# Patient Record
Sex: Female | Born: 1955
Health system: Southern US, Community
[De-identification: ages and names within clinical notes are randomized; demographics above are authoritative.]

## PROBLEM LIST (undated history)

## (undated) DIAGNOSIS — M549 Dorsalgia, unspecified: Secondary | ICD-10-CM

## (undated) DIAGNOSIS — I350 Nonrheumatic aortic (valve) stenosis: Secondary | ICD-10-CM

## (undated) DIAGNOSIS — R0600 Dyspnea, unspecified: Secondary | ICD-10-CM

## (undated) DIAGNOSIS — J45909 Unspecified asthma, uncomplicated: Secondary | ICD-10-CM

## (undated) DIAGNOSIS — Z780 Asymptomatic menopausal state: Secondary | ICD-10-CM

## (undated) DIAGNOSIS — E119 Type 2 diabetes mellitus without complications: Secondary | ICD-10-CM

## (undated) DIAGNOSIS — R1033 Periumbilical pain: Secondary | ICD-10-CM

## (undated) DIAGNOSIS — M7630 Iliotibial band syndrome, unspecified leg: Secondary | ICD-10-CM

## (undated) DIAGNOSIS — J189 Pneumonia, unspecified organism: Secondary | ICD-10-CM

## (undated) DIAGNOSIS — T7840XA Allergy, unspecified, initial encounter: Secondary | ICD-10-CM

## (undated) DIAGNOSIS — M7989 Other specified soft tissue disorders: Secondary | ICD-10-CM

## (undated) DIAGNOSIS — R1310 Dysphagia, unspecified: Secondary | ICD-10-CM

## (undated) DIAGNOSIS — R011 Cardiac murmur, unspecified: Secondary | ICD-10-CM

## (undated) DIAGNOSIS — R002 Palpitations: Secondary | ICD-10-CM

## (undated) DIAGNOSIS — E669 Obesity, unspecified: Secondary | ICD-10-CM

## (undated) DIAGNOSIS — I359 Nonrheumatic aortic valve disorder, unspecified: Secondary | ICD-10-CM

## (undated) DIAGNOSIS — R7303 Prediabetes: Secondary | ICD-10-CM

## (undated) DIAGNOSIS — M1990 Unspecified osteoarthritis, unspecified site: Secondary | ICD-10-CM

## (undated) DIAGNOSIS — K439 Ventral hernia without obstruction or gangrene: Secondary | ICD-10-CM

## (undated) DIAGNOSIS — I10 Essential (primary) hypertension: Secondary | ICD-10-CM

## (undated) DIAGNOSIS — D219 Benign neoplasm of connective and other soft tissue, unspecified: Secondary | ICD-10-CM

## (undated) DIAGNOSIS — M7500 Adhesive capsulitis of unspecified shoulder: Secondary | ICD-10-CM

## (undated) DIAGNOSIS — R12 Heartburn: Secondary | ICD-10-CM

## (undated) DIAGNOSIS — E7800 Pure hypercholesterolemia, unspecified: Secondary | ICD-10-CM

## (undated) DIAGNOSIS — N183 Chronic kidney disease, stage 3 unspecified: Secondary | ICD-10-CM

## (undated) DIAGNOSIS — J449 Chronic obstructive pulmonary disease, unspecified: Secondary | ICD-10-CM

## (undated) DIAGNOSIS — K5900 Constipation, unspecified: Secondary | ICD-10-CM

## (undated) DIAGNOSIS — T502X5A Adverse effect of carbonic-anhydrase inhibitors, benzothiadiazides and other diuretics, initial encounter: Secondary | ICD-10-CM

## (undated) DIAGNOSIS — M2550 Pain in unspecified joint: Secondary | ICD-10-CM

## (undated) DIAGNOSIS — M25511 Pain in right shoulder: Secondary | ICD-10-CM

## (undated) DIAGNOSIS — E876 Hypokalemia: Secondary | ICD-10-CM

## (undated) HISTORY — DX: Type 2 diabetes mellitus without complications: E11.9

## (undated) HISTORY — PX: COLONOSCOPY: SHX174

## (undated) HISTORY — DX: Chronic kidney disease, stage 3 unspecified: N18.30

## (undated) HISTORY — PX: CARDIAC CATHETERIZATION: SHX172

## (undated) HISTORY — DX: Cardiac murmur, unspecified: R01.1

## (undated) HISTORY — DX: Iliotibial band syndrome, unspecified leg: M76.30

## (undated) HISTORY — DX: Other specified soft tissue disorders: M79.89

## (undated) HISTORY — DX: Constipation, unspecified: K59.00

## (undated) HISTORY — DX: Unspecified asthma, uncomplicated: J45.909

## (undated) HISTORY — DX: Palpitations: R00.2

## (undated) HISTORY — DX: Adhesive capsulitis of unspecified shoulder: M75.00

## (undated) HISTORY — DX: Ventral hernia without obstruction or gangrene: K43.9

## (undated) HISTORY — DX: Dorsalgia, unspecified: M54.9

## (undated) HISTORY — DX: Allergy, unspecified, initial encounter: T78.40XA

## (undated) HISTORY — DX: Asymptomatic menopausal state: Z78.0

## (undated) HISTORY — DX: Dysphagia, unspecified: R13.10

## (undated) HISTORY — DX: Pure hypercholesterolemia, unspecified: E78.00

## (undated) HISTORY — DX: Hypokalemia: E87.6

## (undated) HISTORY — DX: Obesity, unspecified: E66.9

## (undated) HISTORY — DX: Benign neoplasm of connective and other soft tissue, unspecified: D21.9

## (undated) HISTORY — DX: Unspecified osteoarthritis, unspecified site: M19.90

## (undated) HISTORY — DX: Pain in unspecified joint: M25.50

## (undated) HISTORY — DX: Nonrheumatic aortic (valve) stenosis: I35.0

## (undated) HISTORY — DX: Pain in right shoulder: M25.511

## (undated) HISTORY — DX: Prediabetes: R73.03

## (undated) HISTORY — DX: Hypokalemia: T50.2X5A

## (undated) HISTORY — DX: Heartburn: R12

## (undated) HISTORY — DX: Dyspnea, unspecified: R06.00

## (undated) HISTORY — DX: Essential (primary) hypertension: I10

## (undated) HISTORY — DX: Periumbilical pain: R10.33

---
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
## 2014-03-17 ENCOUNTER — Ambulatory Visit (INDEPENDENT_AMBULATORY_CARE_PROVIDER_SITE_OTHER): Payer: 59 | Admitting: Internal Medicine

## 2014-03-17 ENCOUNTER — Other Ambulatory Visit (INDEPENDENT_AMBULATORY_CARE_PROVIDER_SITE_OTHER): Payer: 59

## 2014-03-17 ENCOUNTER — Encounter: Payer: Self-pay | Admitting: Internal Medicine

## 2014-03-17 VITALS — BP 232/132 | HR 80 | Temp 97.8°F | Resp 14 | Ht 71.0 in | Wt 342.0 lb

## 2014-03-17 DIAGNOSIS — I160 Hypertensive urgency: Secondary | ICD-10-CM

## 2014-03-17 DIAGNOSIS — I10 Essential (primary) hypertension: Secondary | ICD-10-CM

## 2014-03-17 LAB — BASIC METABOLIC PANEL
BUN: 10 mg/dL (ref 6–23)
CHLORIDE: 108 meq/L (ref 96–112)
CO2: 29 meq/L (ref 19–32)
Calcium: 9.7 mg/dL (ref 8.4–10.5)
Creatinine, Ser: 1.01 mg/dL (ref 0.40–1.20)
GFR: 71.85 mL/min (ref >60.00)
Glucose, Bld: 92 mg/dL (ref 70–99)
Potassium: 4.6 mEq/L (ref 3.5–5.1)
Sodium: 143 mEq/L (ref 135–145)

## 2014-03-17 LAB — CBC
HEMATOCRIT: 38.4 % (ref 36.0–46.0)
Hemoglobin: 12.6 g/dL (ref 12.0–15.0)
MCHC: 32.9 g/dL (ref 30.0–36.0)
MCV: 81.6 fl (ref 78.0–100.0)
PLATELETS: 261 10*3/uL (ref 150.0–400.0)
RBC: 4.71 Mil/uL (ref 3.87–5.11)
RDW: 16 % — ABNORMAL HIGH (ref 11.5–15.5)
WBC: 4.8 10*3/uL (ref 4.0–10.5)

## 2014-03-17 LAB — LIPID PANEL
CHOL/HDL RATIO: 4
Cholesterol: 220 mg/dL — ABNORMAL HIGH (ref 0–200)
HDL: 55.7 mg/dL (ref >39.00)
LDL Cholesterol: 143 mg/dL — ABNORMAL HIGH (ref 0–99)
NONHDL: 164.3
Triglycerides: 109 mg/dL (ref 0.0–149.0)
VLDL: 21.8 mg/dL (ref 0.0–40.0)

## 2014-03-17 LAB — TSH: TSH: 0.74 u[IU]/mL (ref 0.35–4.50)

## 2014-03-17 LAB — HEMOGLOBIN A1C: Hgb A1c MFr Bld: 6.2 % (ref 4.6–6.5)

## 2014-03-17 MED ORDER — LISINOPRIL-HYDROCHLOROTHIAZIDE 20-25 MG PO TABS: 1.0000 | ORAL_TABLET | Freq: Every day | ORAL | Status: DC

## 2014-03-17 NOTE — Patient Instructions (Addendum)
We would like you to go see an eye doctor to make sure there hasn't been any damage from the blood pressure.   We have checked an EKG of the heart today which has changes from many years of high blood pressure but no signs of a heart attack today.   We have called in a blood pressure medicine that we would like you to pick up today and start taking called lisinopril/hctz.Take 1 pill a day.    We would like to see you back on Monday or Tuesday so we can check on the blood pressure. At that time if it's doing better we can send you to the stomach doctor.   High blood pressure that goes untreated for years can cause damage to the heart and kidneys. It can increase your risk of heart attack and stroke.   Working on exercising and eating less salt will help a little bit. We would like you to work on losing 10 pounds in the next 1-2 months to help your health.   Hypertension Hypertension, commonly called high blood pressure, is when the force of blood pumping through your arteries is too strong. Your arteries are the blood vessels that carry blood from your heart throughout your body. A blood pressure reading consists of a higher number over a lower number, such as 110/72. The higher number (systolic) is the pressure inside your arteries when your heart pumps. The lower number (diastolic) is the pressure inside your arteries when your heart relaxes. Ideally you want your blood pressure below 120/80. Hypertension forces your heart to work harder to pump blood. Your arteries may become narrow or stiff. Having hypertension puts you at risk for heart disease, stroke, and other problems.  RISK FACTORS Some risk factors for high blood pressure are controllable. Others are not.  Risk factors you cannot control include:   Race. You may be at higher risk if you are African American.  Age. Risk increases with age.  Gender. Men are at higher risk than women before age 82 years. After age 74, women are at  higher risk than men. Risk factors you can control include:  Not getting enough exercise or physical activity.  Being overweight.  Getting too much fat, sugar, calories, or salt in your diet.  Drinking too much alcohol. SIGNS AND SYMPTOMS Hypertension does not usually cause signs or symptoms. Extremely high blood pressure (hypertensive crisis) may cause headache, anxiety, shortness of breath, and nosebleed. DIAGNOSIS  To check if you have hypertension, your health care provider will measure your blood pressure while you are seated, with your arm held at the level of your heart. It should be measured at least twice using the same arm. Certain conditions can cause a difference in blood pressure between your right and left arms. A blood pressure reading that is higher than normal on one occasion does not mean that you need treatment. If one blood pressure reading is high, ask your health care provider about having it checked again. TREATMENT  Treating high blood pressure includes making lifestyle changes and possibly taking medicine. Living a healthy lifestyle can help lower high blood pressure. You may need to change some of your habits. Lifestyle changes may include:  Following the DASH diet. This diet is high in fruits, vegetables, and whole grains. It is low in salt, red meat, and added sugars.  Getting at least 2 hours of brisk physical activity every week.  Losing weight if necessary.  Not smoking.  Limiting alcoholic  beverages.  Learning ways to reduce stress. If lifestyle changes are not enough to get your blood pressure under control, your health care provider may prescribe medicine. You may need to take more than one. Work closely with your health care provider to understand the risks and benefits. HOME CARE INSTRUCTIONS  Have your blood pressure rechecked as directed by your health care provider.   Take medicines only as directed by your health care provider. Follow the  directions carefully. Blood pressure medicines must be taken as prescribed. The medicine does not work as well when you skip doses. Skipping doses also puts you at risk for problems.   Do not smoke.   Monitor your blood pressure at home as directed by your health care provider. SEEK MEDICAL CARE IF:   You think you are having a reaction to medicines taken.  You have recurrent headaches or feel dizzy.  You have swelling in your ankles.  You have trouble with your vision. SEEK IMMEDIATE MEDICAL CARE IF:  You develop a severe headache or confusion.  You have unusual weakness, numbness, or feel faint.  You have severe chest or abdominal pain.  You vomit repeatedly.  You have trouble breathing. MAKE SURE YOU:   Understand these instructions.  Will watch your condition.  Will get help right away if you are not doing well or get worse. Document Released: 12/31/2004 Document Revised: 05/17/2013 Document Reviewed: 10/23/2012 [facility] Patient Information 2015 [place], [place]. This information is not intended to replace advice given to you by your health care provider. Make sure you discuss any questions you have with your health care provider.

## 2014-03-17 NOTE — Assessment & Plan Note (Signed)
Given other more acute concern unable to discuss fully but advised weight loss and exercise for her health. Check HgA1c.

## 2014-03-17 NOTE — Progress Notes (Signed)
   Subjective:    Patient ID: Rose Miller, female    DOB: May 03, 1955, 59 y.o.   MRN: [id number]  HPI The patient is a new patient coming in for hypertension. She was on blood pressure medicine for many years but stopped seeing her doctor in [place] about 3-4 years ago. She has had no medicine since that time. She denies headache, chest pain, blurred vision, numbness, weakness, nausea, vomiting, confusion. She is not eating well lately and is not exercising. In [place] she saw a heart doctor who told her she had some thickening of the heart muscle (likely from hypertension although patient did not know that). She does not have any other known medical problems. She is several years overdue for colonoscopy repeat.   PMH, [facility], social history reviewed and updated at today's visit.   Review of Systems  Constitutional: Positive for activity change. Negative for fever, chills, appetite change, fatigue and unexpected weight change.  HENT: Negative.   Eyes: Negative for photophobia, redness and visual disturbance.  Respiratory: Negative for cough, chest tightness, shortness of breath and wheezing.   Cardiovascular: Negative for chest pain, palpitations and leg swelling.  Gastrointestinal: Positive for constipation. Negative for nausea, abdominal pain, diarrhea and abdominal distention.       Chronic  Endocrine: Negative.   Musculoskeletal: Negative.   Skin: Negative.   Neurological: Negative.   Psychiatric/Behavioral: Negative.       Objective:   Physical Exam  Constitutional: She is oriented to person, place, and time. She appears well-developed and well-nourished.  Overweight  HENT:  Head: Normocephalic and atraumatic.  Eyes: EOM are normal. Pupils are equal, round, and reactive to light.  Neck: Normal range of motion.  Cardiovascular: Normal rate and regular rhythm.   Murmur heard. Systolic murmur radiating to carotids  Pulmonary/Chest: Effort normal and breath sounds normal. No  respiratory distress. She has no wheezes.  Abdominal: Soft. Bowel sounds are normal. She exhibits no distension. There is no tenderness.  Musculoskeletal: She exhibits no edema.  Neurological: She is alert and oriented to person, place, and time. Coordination normal.  Skin: Skin is warm and dry.  Psychiatric: She has a normal mood and affect.   Filed Vitals:   03/17/14 1359  BP: 232/132  Pulse: 80  Temp: 97.8 F (36.6 C)  TempSrc: Oral  Resp: 14  Height: 5\' 11"  (1.803 m)  Weight: 342 lb (155.13 kg)  SpO2: 93%   EKG: rate 51, intervals okay, normal axis, LVH noted, no st or t wave changes. No old to compare to.      Assessment & Plan:  Visit time 45 minutes, greater than 50% of which was spent in face to face counseling about severe acute condition.

## 2014-03-17 NOTE — Progress Notes (Signed)
Pre visit review using our clinic review tool, if applicable. No additional management support is needed unless otherwise documented below in the visit note. 

## 2014-03-17 NOTE — Assessment & Plan Note (Signed)
>>  ASSESSMENT AND PLAN FOR MORBID OBESITY (HCC) WRITTEN ON 03/17/2014  7:17 PM BY Genella Mech A, MD  Given other more acute concern unable to discuss fully but advised weight loss and exercise for her health. Check HgA1c.

## 2014-03-17 NOTE — Assessment & Plan Note (Signed)
Asymptomatic, check BMP, EKG. Suspect that she has chronic uncontrolled HTN (based on her thickened heart muscles years ago). Stressed today the importance of taking blood pressure medicine and not stopping. She will start lisinopril/hctz as long as labs okay. Return in 1 week for recheck. Advised if she develops symptoms of her blood pressure to go to ER. Also suspect she will have some degree of CKD given that her pressures are so high for so long uncontrolled.

## 2014-03-21 ENCOUNTER — Ambulatory Visit (INDEPENDENT_AMBULATORY_CARE_PROVIDER_SITE_OTHER): Payer: 59 | Admitting: Internal Medicine

## 2014-03-21 ENCOUNTER — Encounter: Payer: Self-pay | Admitting: Internal Medicine

## 2014-03-21 VITALS — BP 180/92 | HR 84 | Temp 97.8°F | Resp 16 | Wt 339.0 lb

## 2014-03-21 DIAGNOSIS — Z1239 Encounter for other screening for malignant neoplasm of breast: Secondary | ICD-10-CM

## 2014-03-21 DIAGNOSIS — I10 Essential (primary) hypertension: Secondary | ICD-10-CM

## 2014-03-21 DIAGNOSIS — Z1211 Encounter for screening for malignant neoplasm of colon: Secondary | ICD-10-CM

## 2014-03-21 MED ORDER — AMLODIPINE BESYLATE 10 MG PO TABS: 10.0000 mg | ORAL_TABLET | Freq: Every day | ORAL | Status: DC

## 2014-03-21 NOTE — Progress Notes (Signed)
Pre visit review using our clinic review tool, if applicable. No additional management support is needed unless otherwise documented below in the visit note. 

## 2014-03-21 NOTE — Patient Instructions (Signed)
We will send in a new blood pressure medicine called amlodipine which you take 1 pill daily. I would like you to start taking this Saturday this week. This will give your body enough time to adjust to the blood pressure.   We would like you to start walking or doing some kind of exercise about 3 times per week. Start with 5 minutes at a time and then increase by 5-10 minutes per week until you are able to do 30 minutes at a time.   Come back in about 1 month to check on the blood pressure. If you have any problems or questions before then please call our office.  Low-Sodium Eating Plan Sodium raises blood pressure and causes water to be held in the body. Getting less sodium from food will help lower your blood pressure, reduce any swelling, and protect your heart, liver, and kidneys. We get sodium by adding salt (sodium chloride) to food. Most of our sodium comes from canned, boxed, and frozen foods. Restaurant foods, fast foods, and pizza are also very high in sodium. Even if you take medicine to lower your blood pressure or to reduce fluid in your body, getting less sodium from your food is important. WHAT IS MY PLAN? Most people should limit their sodium intake to 2,300 mg a day. Your health care provider recommends that you limit your sodium intake to ________3000 mg__ a day.  WHAT DO I NEED TO KNOW ABOUT THIS EATING PLAN? For the low-sodium eating plan, you will follow these general guidelines:  Choose foods with a % Daily Value for sodium of less than 5% (as listed on the food label).   Use salt-free seasonings or herbs instead of table salt or sea salt.   Check with your health care provider or pharmacist before using salt substitutes.   Eat fresh foods.  Eat more vegetables and fruits.  Limit canned vegetables. If you do use them, rinse them well to decrease the sodium.   Limit cheese to 1 oz (28 g) per day.   Eat lower-sodium products, often labeled as "lower sodium" or "no  salt added."  Avoid foods that contain monosodium glutamate (MSG). MSG is sometimes added to [place] food and some canned foods.  Check food labels (Nutrition Facts labels) on foods to learn how much sodium is in one serving.  Eat more home-cooked food and less restaurant, buffet, and fast food.  When eating at a restaurant, ask that your food be prepared with less salt or none, if possible.  HOW DO I READ FOOD LABELS FOR SODIUM INFORMATION? The Nutrition Facts label lists the amount of sodium in one serving of the food. If you eat more than one serving, you must multiply the listed amount of sodium by the number of servings. Food labels may also identify foods as:  Sodium free--Less than 5 mg in a serving.  Very low sodium--35 mg or less in a serving.  Low sodium--140 mg or less in a serving.  Light in sodium--50% less sodium in a serving. For example, if a food that usually has 300 mg of sodium is changed to become light in sodium, it will have 150 mg of sodium.  Reduced sodium--25% less sodium in a serving. For example, if a food that usually has 400 mg of sodium is changed to reduced sodium, it will have 300 mg of sodium. WHAT FOODS CAN I EAT? Grains Low-sodium cereals, including oats, puffed wheat and rice, and shredded wheat cereals. Low-sodium crackers.  Unsalted rice and pasta. Lower-sodium bread.  Vegetables Frozen or fresh vegetables. Low-sodium or reduced-sodium canned vegetables. Low-sodium or reduced-sodium tomato sauce and paste. Low-sodium or reduced-sodium tomato and vegetable juices.  Fruits Fresh, frozen, and canned fruit. Fruit juice.  Meat and Other Protein Products Low-sodium canned tuna and salmon. Fresh or frozen meat, poultry, seafood, and fish. Lamb. Unsalted nuts. Dried beans, peas, and lentils without added salt. Unsalted canned beans. Homemade soups without salt. Eggs.  Dairy Milk. Soy milk. Ricotta cheese. Low-sodium or reduced-sodium cheeses.  Yogurt.  Condiments Fresh and dried herbs and spices. Salt-free seasonings. Onion and garlic powders. Low-sodium varieties of mustard and ketchup. Lemon juice.  Fats and Oils Reduced-sodium salad dressings. Unsalted butter.  Other Unsalted popcorn and pretzels.  The items listed above may not be a complete list of recommended foods or beverages. Contact your dietitian for more options. WHAT FOODS ARE NOT RECOMMENDED? Grains Instant hot cereals. Bread stuffing, pancake, and biscuit mixes. Croutons. Seasoned rice or pasta mixes. Noodle soup cups. Boxed or frozen macaroni and cheese. Self-rising flour. Regular salted crackers. Vegetables Regular canned vegetables. Regular canned tomato sauce and paste. Regular tomato and vegetable juices. Frozen vegetables in sauces. Salted french fries. Olives. Angie Fava. Relishes. Sauerkraut. Salsa. Meat and Other Protein Products Salted, canned, smoked, spiced, or pickled meats, seafood, or fish. Bacon, ham, sausage, hot dogs, corned beef, chipped beef, and packaged luncheon meats. Salt pork. Jerky. Pickled herring. Anchovies, regular canned tuna, and sardines. Salted nuts. Dairy Processed cheese and cheese spreads. Cheese curds. Blue cheese and cottage cheese. Buttermilk.  Condiments Onion and garlic salt, seasoned salt, table salt, and sea salt. Canned and packaged gravies. Worcestershire sauce. Tartar sauce. Barbecue sauce. Teriyaki sauce. Soy sauce, including reduced sodium. Steak sauce. Fish sauce. Oyster sauce. Cocktail sauce. Horseradish. Regular ketchup and mustard. Meat flavorings and tenderizers. Bouillon cubes. Hot sauce. Tabasco sauce. Marinades. Taco seasonings. Relishes. Fats and Oils Regular salad dressings. Salted butter. Margarine. Ghee. Bacon fat.  Other Potato and tortilla chips. Corn chips and puffs. Salted popcorn and pretzels. Canned or dried soups. Pizza. Frozen entrees and pot pies.  The items listed above may not be a  complete list of foods and beverages to avoid. Contact your dietitian for more information. Document Released: 06/22/2001 Document Revised: 01/05/2013 Document Reviewed: 11/04/2012 [facility] Patient Information 2015 [place], [place]. This information is not intended to replace advice given to you by your health care provider. Make sure you discuss any questions you have with your health care provider.

## 2014-03-22 NOTE — Assessment & Plan Note (Signed)
She is doing okay on lisinopril/hctz 20/25. BP much better but still high. Will have her start amlodipine in addition starting this weekend. She will return in about 4-5 weeks for recheck. She did not show any signs of CKD on recent blood work which is very good. Have spoken to her about the seriousness of taking her medicine. She is also to start exercising slowly to help with her pressures and her weight.

## 2014-03-22 NOTE — Progress Notes (Signed)
   Subjective:    Patient ID: Rose Miller, female    DOB: Aug 17, 1955, 59 y.o.   MRN: [id number]  HPI The patient is a 59 YO female who is coming in today to follow up on her blood pressure. She had severe high pressure last week and was started on two blood pressure medications. She has been taking them for about 5 days at this time. She originally felt a little bad on the medicines but has stuck with them. She denies headaches, chest pains, tightness. She is doing okay and feels better now. She has not been able to start exercising.   Review of Systems  Constitutional: Positive for activity change. Negative for fever, chills, appetite change, fatigue and unexpected weight change.       Not exercising  HENT: Negative.   Eyes: Negative for photophobia, redness and visual disturbance.  Respiratory: Negative for cough, chest tightness, shortness of breath and wheezing.   Cardiovascular: Negative for chest pain, palpitations and leg swelling.  Gastrointestinal: Positive for constipation. Negative for nausea, abdominal pain, diarrhea and abdominal distention.       Chronic  Endocrine: Negative.   Musculoskeletal: Negative.   Skin: Negative.   Neurological: Negative.   Psychiatric/Behavioral: Negative.       Objective:   Physical Exam  Constitutional: She is oriented to person, place, and time. She appears well-developed and well-nourished.  Overweight  HENT:  Head: Normocephalic and atraumatic.  Eyes: EOM are normal. Pupils are equal, round, and reactive to light.  Neck: Normal range of motion.  Cardiovascular: Normal rate and regular rhythm.   Murmur heard. Systolic murmur radiating to carotids  Pulmonary/Chest: Effort normal and breath sounds normal. No respiratory distress. She has no wheezes.  Abdominal: Soft. Bowel sounds are normal. She exhibits no distension. There is no tenderness.  Musculoskeletal: She exhibits no edema.  Neurological: She is alert and oriented to person,  place, and time. Coordination normal.  Skin: Skin is warm and dry.  Psychiatric: She has a normal mood and affect.   Filed Vitals:   03/21/14 1538  BP: 180/92  Pulse: 84  Temp: 97.8 F (36.6 C)  TempSrc: Oral  Resp: 16  Weight: 339 lb (153.769 kg)  SpO2: 97%      Assessment & Plan:

## 2014-04-07 ENCOUNTER — Telehealth: Payer: Self-pay | Admitting: Internal Medicine

## 2014-04-12 ENCOUNTER — Ambulatory Visit
Admission: RE | Admit: 2014-04-12 | Discharge: 2014-04-12 | Disposition: A | Discharge status: Home / Self Care | Payer: 59 | Source: Ambulatory Visit | Attending: Internal Medicine | Admitting: Internal Medicine

## 2014-04-12 DIAGNOSIS — Z1239 Encounter for other screening for malignant neoplasm of breast: Secondary | ICD-10-CM

## 2014-04-21 ENCOUNTER — Ambulatory Visit (INDEPENDENT_AMBULATORY_CARE_PROVIDER_SITE_OTHER): Payer: 59 | Admitting: Internal Medicine

## 2014-04-21 ENCOUNTER — Telehealth: Payer: Self-pay | Admitting: Internal Medicine

## 2014-04-21 ENCOUNTER — Encounter: Payer: Self-pay | Admitting: Internal Medicine

## 2014-04-21 ENCOUNTER — Other Ambulatory Visit (INDEPENDENT_AMBULATORY_CARE_PROVIDER_SITE_OTHER): Payer: 59

## 2014-04-21 VITALS — BP 126/78 | HR 84 | Temp 98.5°F | Resp 18 | Wt 333.0 lb

## 2014-04-21 DIAGNOSIS — I10 Essential (primary) hypertension: Secondary | ICD-10-CM | POA: Diagnosis not present

## 2014-04-21 LAB — COMPREHENSIVE METABOLIC PANEL
ALT: 10 U/L (ref 0–35)
AST: 11 U/L (ref 0–37)
Albumin: 4.1 g/dL (ref 3.5–5.2)
Alkaline Phosphatase: 77 U/L (ref 39–117)
BILIRUBIN TOTAL: 0.4 mg/dL (ref 0.2–1.2)
BUN: 29 mg/dL — AB (ref 6–23)
CALCIUM: 9.8 mg/dL (ref 8.4–10.5)
CHLORIDE: 99 meq/L (ref 96–112)
CO2: 31 mEq/L (ref 19–32)
CREATININE: 1.69 mg/dL — AB (ref 0.40–1.20)
GFR: 39.66 mL/min — AB (ref >60.00)
Glucose, Bld: 121 mg/dL — ABNORMAL HIGH (ref 70–99)
POTASSIUM: 4.1 meq/L (ref 3.5–5.1)
SODIUM: 137 meq/L (ref 135–145)
Total Protein: 7.8 g/dL (ref 6.0–8.3)

## 2014-04-21 MED ORDER — AMLODIPINE BESYLATE 10 MG PO TABS: 10.0000 mg | ORAL_TABLET | Freq: Every day | ORAL | Status: DC

## 2014-04-21 NOTE — Telephone Encounter (Signed)
Patient states flonase was suppose to be sent to [facility] on [address].  Pharmacy does not have script.

## 2014-04-21 NOTE — Patient Instructions (Addendum)
We will check some blood work today to make sure that the kidneys are still doing well and no problems from the medicine.   For the sinuses we can have you try a nose spray which helps to stop the nose from making as much mucus and can help clear the head. It is called flonase and use 2 sprays in each nostril once a day. It should start working in about 3-5 days.   You are doing great so keep up the good work with taking the medicines. Your blood pressure is wonderful today!  Now we need to start working on bringing the weight down some so that we can try to keep you healthy for a long time.   Exercise to Lose Weight Exercise and a healthy diet may help you lose weight. Your doctor may suggest specific exercises. EXERCISE IDEAS AND TIPS  Choose low-cost things you enjoy doing, such as walking, bicycling, or exercising to workout videos.  Take stairs instead of the elevator.  Walk during your lunch break.  Park your car further away from work or school.  Go to a gym or an exercise class.  Start with 5 to 10 minutes of exercise each day. Build up to 30 minutes of exercise 4 to 6 days a week.  Wear shoes with good support and comfortable clothes.  Stretch before and after working out.  Work out until you breathe harder and your heart beats faster.  Drink extra water when you exercise.  Do not do so much that you hurt yourself, feel dizzy, or get very short of breath. Exercises that burn about 150 calories:  Running 1  miles in 15 minutes.  Playing volleyball for 45 to 60 minutes.  Washing and waxing a car for 45 to 60 minutes.  Playing touch football for 45 minutes.  Walking 1  miles in 35 minutes.  Pushing a stroller 1  miles in 30 minutes.  Playing basketball for 30 minutes.  Raking leaves for 30 minutes.  Bicycling 5 miles in 30 minutes.  Walking 2 miles in 30 minutes.  Dancing for 30 minutes.  Shoveling snow for 15 minutes.  Swimming laps for 20  minutes.  Walking up stairs for 15 minutes.  Bicycling 4 miles in 15 minutes.  Gardening for 30 to 45 minutes.  Jumping rope for 15 minutes.  Washing windows or floors for 45 to 60 minutes. Document Released: 02/02/2010 Document Revised: 03/25/2011 Document Reviewed: 02/02/2010 [facility] Patient Information 2015 [place], [place]. This information is not intended to replace advice given to you by your health care provider. Make sure you discuss any questions you have with your health care provider.

## 2014-04-21 NOTE — Progress Notes (Signed)
Pre visit review using our clinic review tool, if applicable. No additional management support is needed unless otherwise documented below in the visit note. 

## 2014-04-22 ENCOUNTER — Other Ambulatory Visit: Payer: Self-pay | Admitting: Internal Medicine

## 2014-04-22 ENCOUNTER — Other Ambulatory Visit: Payer: Self-pay | Admitting: Geriatric Medicine

## 2014-04-22 DIAGNOSIS — Z1211 Encounter for screening for malignant neoplasm of colon: Secondary | ICD-10-CM

## 2014-04-22 MED ORDER — FLUTICASONE PROPIONATE 50 MCG/ACT NA SUSP: 2.0000 | Freq: Every day | NASAL | Status: DC

## 2014-04-22 NOTE — Telephone Encounter (Signed)
Sent in

## 2014-04-22 NOTE — Assessment & Plan Note (Signed)
BP at goal today, continue amlodipine, lisinopril/hctz and check labs today as she has been on regimen about 3-4 weeks now.

## 2014-04-22 NOTE — Assessment & Plan Note (Signed)
Down 6 pounds from dietary changes. She will start exercising within the next month and talked with her about continuing with her weight loss to help prevent risk of stroke, heart attack, diabetes in the future. She does not want to talk to nutritionist.

## 2014-04-22 NOTE — Progress Notes (Signed)
   Subjective:    Patient ID: Rose Miller, female    DOB: [birth year], 59 y.o.   MRN: [id number]  HPI The patient is a 59 YO female who is coming in to check on her blood pressure and her weights. She has been taking both her blood pressure medicines now for about 1 month and is doing well. At first felt kind of bad but now is not having side effects. Denies headaches, chills, fevers, chest pains. Her weight is coming down and will be starting to exercise in the next month. She has tried to work on her diet. She is down about 6 pounds and is happy with that start.   Review of Systems  Constitutional: Positive for activity change. Negative for fever, chills, appetite change, fatigue and unexpected weight change.       Not exercising  Eyes: Negative for photophobia, redness and visual disturbance.  Respiratory: Negative for cough, chest tightness, shortness of breath and wheezing.   Cardiovascular: Negative for chest pain, palpitations and leg swelling.  Gastrointestinal: Negative for nausea, abdominal pain, diarrhea and abdominal distention.  Musculoskeletal: Negative.   Neurological: Negative.       Objective:   Physical Exam  Constitutional: She is oriented to person, place, and time. She appears well-developed and well-nourished.  Overweight  HENT:  Head: Normocephalic and atraumatic.  Cardiovascular: Normal rate and regular rhythm.   Murmur heard. Systolic murmur radiating to carotids  Pulmonary/Chest: Effort normal and breath sounds normal. No respiratory distress. She has no wheezes.  Abdominal: Soft. Bowel sounds are normal. She exhibits no distension. There is no tenderness.  Musculoskeletal: She exhibits no edema.  Neurological: She is alert and oriented to person, place, and time. Coordination normal.   Filed Vitals:   04/21/14 1431  BP: 126/78  Pulse: 84  Temp: 98.5 F (36.9 C)  TempSrc: Oral  Resp: 18  Weight: 333 lb (151.048 kg)  SpO2: 94%       Assessment &  Plan:

## 2014-04-22 NOTE — Telephone Encounter (Signed)
Patient will go pick up.

## 2014-04-22 NOTE — Assessment & Plan Note (Signed)
>>  ASSESSMENT AND PLAN FOR MORBID OBESITY (HCC) WRITTEN ON 04/22/2014  8:51 AM BY Dorise Hiss, ELIZABETH A, MD  Down 6 pounds from dietary changes. She will start exercising within the next month and talked with her about continuing with her weight loss to help prevent risk of stroke, heart attack, diabetes in the future. She does not want to talk to nutritionist.

## 2014-04-22 NOTE — Telephone Encounter (Signed)
Were you going to give her flonase?

## 2014-04-25 ENCOUNTER — Telehealth: Payer: Self-pay | Admitting: Geriatric Medicine

## 2014-04-25 ENCOUNTER — Other Ambulatory Visit (INDEPENDENT_AMBULATORY_CARE_PROVIDER_SITE_OTHER): Payer: 59

## 2014-04-25 ENCOUNTER — Other Ambulatory Visit: Payer: Self-pay | Admitting: Internal Medicine

## 2014-04-25 DIAGNOSIS — I10 Essential (primary) hypertension: Secondary | ICD-10-CM

## 2014-04-25 LAB — BASIC METABOLIC PANEL
BUN: 28 mg/dL — ABNORMAL HIGH (ref 6–23)
CHLORIDE: 97 meq/L (ref 96–112)
CO2: 30 meq/L (ref 19–32)
Calcium: 9.5 mg/dL (ref 8.4–10.5)
Creatinine, Ser: 1.78 mg/dL — ABNORMAL HIGH (ref 0.40–1.20)
GFR: 37.35 mL/min — ABNORMAL LOW (ref >60.00)
Glucose, Bld: 139 mg/dL — ABNORMAL HIGH (ref 70–99)
Potassium: 3.9 mEq/L (ref 3.5–5.1)
SODIUM: 134 meq/L — AB (ref 135–145)

## 2014-04-25 NOTE — Telephone Encounter (Signed)
Patient states that she is having terrible stomach pains and wants to know if you can order any tests to try to find out what the problem might be.

## 2014-04-26 ENCOUNTER — Encounter: Payer: Self-pay | Admitting: Internal Medicine

## 2014-04-26 NOTE — Telephone Encounter (Signed)
She needs to stop taking the lisinopril/hctz. Keep taking amlodipine and come back on Friday for blood work. She may need acute visit for the stomach problems as if she is getting dehydrated this can be what is affecting the kidney function.

## 2014-04-26 NOTE — Telephone Encounter (Signed)
Spoke with patient. She will stop lisinopril hctz and will come in Friday for an appt and to repeat labs.

## 2014-04-29 ENCOUNTER — Encounter: Payer: Self-pay | Admitting: Internal Medicine

## 2014-04-29 ENCOUNTER — Other Ambulatory Visit (INDEPENDENT_AMBULATORY_CARE_PROVIDER_SITE_OTHER): Payer: 59

## 2014-04-29 ENCOUNTER — Ambulatory Visit (INDEPENDENT_AMBULATORY_CARE_PROVIDER_SITE_OTHER): Payer: 59 | Admitting: Internal Medicine

## 2014-04-29 VITALS — BP 156/96 | HR 86 | Temp 98.5°F | Resp 18 | Wt 330.0 lb

## 2014-04-29 DIAGNOSIS — I10 Essential (primary) hypertension: Secondary | ICD-10-CM

## 2014-04-29 DIAGNOSIS — N179 Acute kidney failure, unspecified: Secondary | ICD-10-CM | POA: Diagnosis not present

## 2014-04-29 DIAGNOSIS — O10019 Pre-existing essential hypertension complicating pregnancy, unspecified trimester: Secondary | ICD-10-CM

## 2014-04-29 DIAGNOSIS — K439 Ventral hernia without obstruction or gangrene: Secondary | ICD-10-CM | POA: Diagnosis not present

## 2014-04-29 LAB — COMPREHENSIVE METABOLIC PANEL
ALBUMIN: 4.2 g/dL (ref 3.5–5.2)
ALK PHOS: 75 U/L (ref 39–117)
ALT: 13 U/L (ref 0–35)
AST: 14 U/L (ref 0–37)
BILIRUBIN TOTAL: 0.4 mg/dL (ref 0.2–1.2)
BUN: 18 mg/dL (ref 6–23)
CHLORIDE: 99 meq/L (ref 96–112)
CO2: 31 meq/L (ref 19–32)
CREATININE: 1.29 mg/dL — AB (ref 0.40–1.20)
Calcium: 10.1 mg/dL (ref 8.4–10.5)
GFR: 54.15 mL/min — ABNORMAL LOW (ref >60.00)
GLUCOSE: 105 mg/dL — AB (ref 70–99)
Potassium: 4.2 mEq/L (ref 3.5–5.1)
Sodium: 136 mEq/L (ref 135–145)
Total Protein: 8.3 g/dL (ref 6.0–8.3)

## 2014-04-29 MED ORDER — ATENOLOL 50 MG PO TABS: 50.0000 mg | ORAL_TABLET | Freq: Every day | ORAL | Status: DC

## 2014-04-29 NOTE — Progress Notes (Signed)
Pre visit review using our clinic review tool, if applicable. No additional management support is needed unless otherwise documented below in the visit note. 

## 2014-04-29 NOTE — Patient Instructions (Signed)
The bulge in the stomach is most likely an umbilical hernia. They are not harmful unless they pop up and do not go back in. They can from grow excessive coughing, straining. Extra stomach fat can also cause it to grow.   We have sent in atenolol. Please stay off the lisinopril/hctz. We are rechecking your blood work today and will call you back.   Ventral Hernia A ventral hernia (also called an incisional hernia) is a hernia that occurs at the site of a previous surgical cut (incision) in the abdomen. The abdominal wall spans from your lower chest down to your pelvis. If the abdominal wall is weakened from a surgical incision, a hernia can occur. A hernia is a bulge of bowel or muscle tissue pushing out on the weakened part of the abdominal wall. Ventral hernias can get bigger from straining or lifting. Obese and older people are at higher risk for a ventral hernia. People who develop infections after surgery or require repeat incisions at the same site on the abdomen are also at increased risk. CAUSES  A ventral hernia occurs because of weakness in the abdominal wall at an incision site.  SYMPTOMS  Common symptoms include:  A visible bulge or lump on the abdominal wall.  Pain or tenderness around the lump.  Increased discomfort if you cough or make a sudden movement. If the hernia has blocked part of the intestine, a serious complication can occur (incarcerated or strangulated hernia). This can become a problem that requires emergency surgery because the blood flow to the blocked intestine may be cut off. Symptoms may include:  Feeling sick to your stomach (nauseous).  Throwing up (vomiting).  Stomach swelling (distention) or bloating.  Fever.  Rapid heartbeat. DIAGNOSIS  Your health care provider will take a medical history and perform a physical exam. Various tests may be ordered, such as:  Blood tests.  Urine tests.  Ultrasonography.  X-rays.  Computed tomography  (CT). TREATMENT  Watchful waiting may be all that is needed for a smaller hernia that does not cause symptoms. Your health care provider may recommend the use of a supportive belt (truss) that helps to keep the abdominal wall intact. For larger hernias or those that cause pain, surgery to repair the hernia is usually recommended. If a hernia becomes strangulated, emergency surgery needs to be done right away. HOME CARE INSTRUCTIONS  Avoid putting pressure or strain on the abdominal area.  Avoid heavy lifting.  Use good body positioning for physical tasks. Ask your health care provider about proper body positioning.  Use a supportive belt as directed by your health care provider.  Maintain a healthy weight.  Eat foods that are high in fiber, such as whole grains, fruits, and vegetables. Fiber helps prevent difficult bowel movements (constipation).  Drink enough fluids to keep your urine clear or pale yellow.  Follow up with your health care provider as directed. SEEK MEDICAL CARE IF:   Your hernia seems to be getting larger or more painful. SEEK IMMEDIATE MEDICAL CARE IF:   You have abdominal pain that is sudden and sharp.  Your pain becomes severe.  You have repeated vomiting.  You are sweating a lot.  You notice a rapid heartbeat.  You develop a fever. MAKE SURE YOU:   Understand these instructions.  Will watch your condition.  Will get help right away if you are not doing well or get worse. Document Released: 12/18/2011 Document Revised: 05/17/2013 Document Reviewed: 12/18/2011 [employer] Patient Information 2015  [employer]. This information is not intended to replace advice given to you by your health care provider. Make sure you discuss any questions you have with your health care provider.

## 2014-05-01 DIAGNOSIS — N179 Acute kidney failure, unspecified: Secondary | ICD-10-CM | POA: Insufficient documentation

## 2014-05-01 DIAGNOSIS — K439 Ventral hernia without obstruction or gangrene: Secondary | ICD-10-CM | POA: Insufficient documentation

## 2014-05-01 NOTE — Assessment & Plan Note (Addendum)
BP has increased since stopping lisinopril/hctz and is in moderate exacerbation. Will add atenolol and see her back in 1 month. Rechecking blood work today to check on kidneys. Will recheck labs in 1 month as well to see how she is doing. May need adjustment to her regimen at follow up. If any symptoms or problems with the atenolol will call before visit. Continue amlodipine and add atenolol. Stay off lisinopril/hctz for now.

## 2014-05-01 NOTE — Progress Notes (Signed)
   Subjective:    Patient ID: Rose Miller, female    DOB: [birth year], 59 y.o.   MRN: [id number]  HPI The patient is a 59 YO female who is coming in for recheck of her hypertension (newly back on medications and had to stop lisinopril/hctz due to renal insufficiency earlier this week). She has been taking the amlodipine alone and is feeling better. She realizes that the lisinopril/hctz had been making her feel a little nauseous. She denies headaches, chest pains, SOB, confusion. The other reason she is here is for abdominal pain. She has a spot that bulges out right above her belly button. Especially if she coughs or strains. About the size of a nickel and always able to go back in. Some pain when it pushes out but does not last long. Has been present for many months and she is worried about it getting bigger or if it would cause problems.   Review of Systems  Constitutional: Negative for fever, chills, appetite change, fatigue and unexpected weight change.       Not exercising  Eyes: Negative for photophobia, redness and visual disturbance.  Respiratory: Negative for cough, chest tightness, shortness of breath and wheezing.   Cardiovascular: Negative for chest pain, palpitations and leg swelling.  Gastrointestinal: Positive for abdominal distention. Negative for nausea, abdominal pain, diarrhea, constipation and blood in stool.  Musculoskeletal: Negative.   Neurological: Negative.       Objective:   Physical Exam  Constitutional: She is oriented to person, place, and time. She appears well-developed and well-nourished.  Overweight  HENT:  Head: Normocephalic and atraumatic.  Cardiovascular: Normal rate and regular rhythm.   Murmur heard. Systolic murmur radiating to carotids  Pulmonary/Chest: Effort normal and breath sounds normal. No respiratory distress. She has no wheezes.  Abdominal: Soft. Bowel sounds are normal. She exhibits no distension. There is no tenderness.  Not able to  appreciate what sounds to be an umbilical hernia, coughing does not provoke it on exam today.  Musculoskeletal: She exhibits no edema.  Neurological: She is alert and oriented to person, place, and time. Coordination normal.   Filed Vitals:   04/29/14 1448 04/29/14 1528  BP: 162/102 156/96  Pulse: 86   Temp: 98.5 F (36.9 C)   TempSrc: Oral   Resp: 18   Weight: 330 lb (149.687 kg)   SpO2: 95%       Assessment & Plan:

## 2014-05-01 NOTE — Assessment & Plan Note (Signed)
Sounds to be a ventral hernia around her umbilicus. Not able to appreciate on exam today. Reassured her that she does not need a CT scan. Moving her bowels normally. Talked to her about avoiding straining and excessive coughing. Also talked to her about extra stomach fat can cause pressure which causes them to increase in size. Not recommended to fix at this time. Conservative management of staying regular and losing some stomach weight.

## 2014-05-01 NOTE — Assessment & Plan Note (Signed)
On last bloodwork found to have AKI, likely medication induced from lisinopril/hctz. Will recheck labs today (off for 3-4 days). If no improvement will check US renal. Add atenolol for BP control. Can consider adding back hctz in 1-2 months after full recovery.

## 2014-05-24 ENCOUNTER — Ambulatory Visit (AMBULATORY_SURGERY_CENTER): Payer: Self-pay

## 2014-05-24 VITALS — Ht 71.0 in | Wt 336.8 lb

## 2014-05-24 DIAGNOSIS — Z800 Family history of malignant neoplasm of digestive organs: Secondary | ICD-10-CM

## 2014-05-24 NOTE — Progress Notes (Signed)
No allergies to eggs or soy No past problems with anesthesia No diet/weight loss meds No home oxygen

## 2014-06-06 ENCOUNTER — Encounter: Payer: Self-pay | Admitting: Internal Medicine

## 2014-06-06 ENCOUNTER — Other Ambulatory Visit (INDEPENDENT_AMBULATORY_CARE_PROVIDER_SITE_OTHER): Payer: 59

## 2014-06-06 ENCOUNTER — Ambulatory Visit (INDEPENDENT_AMBULATORY_CARE_PROVIDER_SITE_OTHER): Payer: 59 | Admitting: Internal Medicine

## 2014-06-06 VITALS — BP 118/88 | HR 42 | Temp 98.5°F | Resp 12 | Wt 340.0 lb

## 2014-06-06 DIAGNOSIS — I10 Essential (primary) hypertension: Secondary | ICD-10-CM

## 2014-06-06 DIAGNOSIS — N179 Acute kidney failure, unspecified: Secondary | ICD-10-CM

## 2014-06-06 LAB — BASIC METABOLIC PANEL
BUN: 14 mg/dL (ref 6–23)
CO2: 29 mEq/L (ref 19–32)
Calcium: 9.2 mg/dL (ref 8.4–10.5)
Chloride: 106 mEq/L (ref 96–112)
Creatinine, Ser: 1.05 mg/dL (ref 0.40–1.20)
GFR: 68.65 mL/min (ref >60.00)
Glucose, Bld: 93 mg/dL (ref 70–99)
POTASSIUM: 4.3 meq/L (ref 3.5–5.1)
SODIUM: 138 meq/L (ref 135–145)

## 2014-06-06 MED ORDER — ATENOLOL 25 MG PO TABS: 25.0000 mg | ORAL_TABLET | Freq: Every day | ORAL | Status: DC

## 2014-06-06 NOTE — Assessment & Plan Note (Signed)
Recovering and will recheck BMP today to ensure back to normal. Will watch and could re-introduce those medicines with monitoring in the future. BP well controlled today.

## 2014-06-06 NOTE — Assessment & Plan Note (Signed)
Will halve atenolol dose to 25 mg daily since her HR is 42 today on 50 mg daily and she has had several episodes of lightheadedness. Checking BMP for full recovery of the kidneys. BP at goal. Will recheck in 3 months and reminded her about her weight loss goals (she is up today in weight).

## 2014-06-06 NOTE — Progress Notes (Signed)
   Subjective:    Patient ID: Rose Miller, female    DOB: 1955/05/19, 59 y.o.   MRN: [id number]  HPI The patient is a 59 YO female coming in to follow up on her abnormal labs and her high blood pressure. She was well controlled but got AKI from lisinopril/hctz. She has been switched to atenolol and continued on amlodipine. She is feeling better overall. 2-3 episodes of lightheadedness while on the atenolol. No syncope. Passes in 30 seconds. No chest pains, headaches, SOB.   Review of Systems  Constitutional: Positive for activity change. Negative for fever, chills, appetite change, fatigue and unexpected weight change.       Not exercising  Eyes: Negative for photophobia, redness and visual disturbance.  Respiratory: Negative for cough, chest tightness, shortness of breath and wheezing.   Cardiovascular: Negative for chest pain, palpitations and leg swelling.  Gastrointestinal: Negative for nausea, abdominal pain, diarrhea and abdominal distention.  Musculoskeletal: Negative.   Skin: Negative.   Neurological: Negative.       Objective:   Physical Exam  Constitutional: She is oriented to person, place, and time. She appears well-developed and well-nourished.  Overweight  HENT:  Head: Normocephalic and atraumatic.  Cardiovascular: Normal rate and regular rhythm.   Murmur heard. Systolic murmur radiating to carotids  Pulmonary/Chest: Effort normal and breath sounds normal. No respiratory distress. She has no wheezes.  Abdominal: Soft. Bowel sounds are normal. She exhibits no distension. There is no tenderness.  Musculoskeletal: She exhibits no edema.  Neurological: She is alert and oriented to person, place, and time. Coordination normal.   Filed Vitals:   06/06/14 1042  BP: 118/88  Pulse: 42  Temp: 98.5 F (36.9 C)  TempSrc: Oral  Resp: 12  Weight: 340 lb (154.223 kg)  SpO2: 97%      Assessment & Plan:

## 2014-06-06 NOTE — Progress Notes (Signed)
Pre visit review using our clinic review tool, if applicable. No additional management support is needed unless otherwise documented below in the visit note. 

## 2014-06-06 NOTE — Patient Instructions (Signed)
We are going to have you cut the amlodipine in half until they are gone. This will help with the feeling or lightheadedness and dizziness. We will send in the new strength and then you will be back to taking 1 pill a day (25 mg).   Keep taking the amlodipine as you have been.   We are going to check the blood work today and call you back with the results.   Keep up the work on the weight as this will help naturally bring the blood pressure down some and keep you healthy.   Come back in about 3 months. If you are having problems before then please feel free to call the office.  Exercise to Lose Weight Exercise and a healthy diet may help you lose weight. Your doctor may suggest specific exercises. EXERCISE IDEAS AND TIPS  Choose low-cost things you enjoy doing, such as walking, bicycling, or exercising to workout videos.  Take stairs instead of the elevator.  Walk during your lunch break.  Park your car further away from work or school.  Go to a gym or an exercise class.  Start with 5 to 10 minutes of exercise each day. Build up to 30 minutes of exercise 4 to 6 days a week.  Wear shoes with good support and comfortable clothes.  Stretch before and after working out.  Work out until you breathe harder and your heart beats faster.  Drink extra water when you exercise.  Do not do so much that you hurt yourself, feel dizzy, or get very short of breath. Exercises that burn about 150 calories:  Running 1  miles in 15 minutes.  Playing volleyball for 45 to 60 minutes.  Washing and waxing a car for 45 to 60 minutes.  Playing touch football for 45 minutes.  Walking 1  miles in 35 minutes.  Pushing a stroller 1  miles in 30 minutes.  Playing basketball for 30 minutes.  Raking leaves for 30 minutes.  Bicycling 5 miles in 30 minutes.  Walking 2 miles in 30 minutes.  Dancing for 30 minutes.  Shoveling snow for 15 minutes.  Swimming laps for 20 minutes.  Walking up  stairs for 15 minutes.  Bicycling 4 miles in 15 minutes.  Gardening for 30 to 45 minutes.  Jumping rope for 15 minutes.  Washing windows or floors for 45 to 60 minutes. Document Released: 02/02/2010 Document Revised: 03/25/2011 Document Reviewed: 02/02/2010 [facility] Patient Information 2015 [place], [place]. This information is not intended to replace advice given to you by your health care provider. Make sure you discuss any questions you have with your health care provider.

## 2014-06-07 ENCOUNTER — Ambulatory Visit (AMBULATORY_SURGERY_CENTER): Payer: 59 | Admitting: Internal Medicine

## 2014-06-07 ENCOUNTER — Encounter: Payer: Self-pay | Admitting: Internal Medicine

## 2014-06-07 VITALS — BP 131/62 | HR 45 | Temp 96.8°F | Resp 20 | Ht 71.0 in | Wt 336.0 lb

## 2014-06-07 DIAGNOSIS — Z1211 Encounter for screening for malignant neoplasm of colon: Secondary | ICD-10-CM | POA: Diagnosis not present

## 2014-06-07 DIAGNOSIS — Z800 Family history of malignant neoplasm of digestive organs: Secondary | ICD-10-CM

## 2014-06-07 MED ORDER — SODIUM CHLORIDE 0.9 % IV SOLN: 500.0000 mL | INTRAVENOUS | Status: DC

## 2014-06-07 NOTE — Progress Notes (Signed)
Transferred to recovery room. A/O x3, pleased with MAC.  VSS.  Report to Wendy, RN. 

## 2014-06-07 NOTE — Op Note (Signed)
[place]  [address] [place], [address]   COLONOSCOPY PROCEDURE REPORT  PATIENT: Rose Miller, Rose Miller  MR#: [id number] BIRTHDATE: 1955-03-09 , 58  yrs. old GENDER: female ENDOSCOPIST: Gatha Mayer, MD, [facility] PROCEDURE DATE:  06/07/2014 PROCEDURE:   Colonoscopy, screening First Screening Colonoscopy - Avg.  risk and is 50 yrs.  old or older - No.  Prior Negative Screening - Now for repeat screening. Less than 10 yrs Prior Negative Screening - Now for repeat screening.  Above average risk  History of Adenoma - Now for follow-up colonoscopy & has been > or = to 3 yrs.  N/A  Polyps removed today? No Recommend repeat exam, <10 yrs? Yes high risk ASA CLASS:   Class II INDICATIONS:Screening for colonic neoplasia and FH Colon or Rectal Adenocarcinoma. MEDICATIONS: Propofol 250 mg IV and Monitored anesthesia care  DESCRIPTION OF PROCEDURE:   After the risks benefits and alternatives of the procedure were thoroughly explained, informed consent was obtained.  The digital rectal exam revealed no abnormalities of the rectum.   The     endoscope was introduced through the anus and advanced to the cecum, which was identified by both the appendix and ileocecal valve. No adverse events experienced.   The quality of the prep was (MiraLax was used) good. The instrument was then slowly withdrawn as the colon was fully examined. Estimated blood loss is zero unless otherwise noted in this procedure report.      COLON FINDINGS: There was severe diverticulosis noted in the left colon.   The examination was otherwise normal.  Retroflexed views revealed no abnormalities. The time to cecum = 1.9 Withdrawal time = 12.9   The scope was withdrawn and the procedure completed. COMPLICATIONS: There were no immediate complications.  ENDOSCOPIC IMPRESSION: 1.   Severe diverticulosis was noted in the left colon 2.   The examination was otherwise normal  RECOMMENDATIONS: Repeat  colonoscopy in 5 years (2021) planned but if father had colon cancer after his early 63's would not do again until 2026 (10 yrs). She will try to find out.  eSigned:  Gatha Mayer, MD, [facility] 06/07/2014 2:28 PM   cc: Rose Novak, MD and The Patient

## 2014-06-07 NOTE — Patient Instructions (Addendum)
No polyps! You do have diverticulosis - thickened muscle rings and pouches in the colon wall. Please read the handout about this condition.  Your next routine colonoscopy should be in 5 years - 2021 If your father was younger than his early 89's when colon cancer was found. If he was older I would wait 10 years - 2026.  I appreciate the opportunity to care for you. Gatha Mayer, MD, FACG  YOU HAD AN ENDOSCOPIC PROCEDURE TODAY AT [facility]:   Refer to the procedure report that was given to you for any specific questions about what was found during the examination.  If the procedure report does not answer your questions, please call your gastroenterologist to clarify.  If you requested that your care partner not be given the details of your procedure findings, then the procedure report has been included in a sealed envelope for you to review at your convenience later.  YOU SHOULD EXPECT: Some feelings of bloating in the abdomen. Passage of more gas than usual.  Walking can help get rid of the air that was put into your GI tract during the procedure and reduce the bloating. If you had a lower endoscopy (such as a colonoscopy or flexible sigmoidoscopy) you may notice spotting of blood in your stool or on the toilet paper. If you underwent a bowel prep for your procedure, you may not have a normal bowel movement for a few days.  Please Note:  You might notice some irritation and congestion in your nose or some drainage.  This is from the oxygen used during your procedure.  There is no need for concern and it should clear up in a day or so.  SYMPTOMS TO REPORT IMMEDIATELY:   Following lower endoscopy (colonoscopy or flexible sigmoidoscopy):  Excessive amounts of blood in the stool  Significant tenderness or worsening of abdominal pains  Swelling of the abdomen that is new, acute  Fever of 100F or higher  For urgent or emergent issues, a gastroenterologist can be  reached at any hour by calling [phone].   DIET: Your first meal following the procedure should be a small meal and then it is ok to progress to your normal diet. Heavy or fried foods are harder to digest and may make you feel nauseous or bloated.  Likewise, meals heavy in dairy and vegetables can increase bloating.  Drink plenty of fluids but you should avoid alcoholic beverages for 24 hours.  ACTIVITY:  You should plan to take it easy for the rest of today and you should NOT DRIVE or use heavy machinery until tomorrow (because of the sedation medicines used during the test).    FOLLOW UP: Our staff will call the number listed on your records the next business day following your procedure to check on you and address any questions or concerns that you may have regarding the information given to you following your procedure. If we do not reach you, we will leave a message.  However, if you are feeling well and you are not experiencing any problems, there is no need to return our call.  We will assume that you have returned to your regular daily activities without incident.  If any biopsies were taken you will be contacted by phone or by letter within the next 1-3 weeks.  Please call us at [phone] if you have not heard about the biopsies in 3 weeks.    SIGNATURES/CONFIDENTIALITY: You and/or your care partner have  signed paperwork which will be entered into your electronic medical record.  These signatures attest to the fact that that the information above on your After Visit Summary has been reviewed and is understood.  Full responsibility of the confidentiality of this discharge information lies with you and/or your care-partner.  Please review diverticulosis handout provided.

## 2014-06-08 ENCOUNTER — Telehealth: Payer: Self-pay | Admitting: *Deleted

## 2014-06-08 NOTE — Telephone Encounter (Signed)
  Follow up Call-  Call back number 06/07/2014  Post procedure Call Back phone  # [phone] or cell number [phone] leave message  Permission to leave phone message Yes     Patient questions:  Do you have a fever, pain , or abdominal swelling? No. Pain Score  0 *  Have you tolerated food without any problems? Yes.    Have you been able to return to your normal activities? Yes.    Do you have any questions about your discharge instructions: Diet   No. Medications  No. Follow up visit  No.  Do you have questions or concerns about your Care? No.  Actions: * If pain score is 4 or above: No action needed, pain <4.

## 2014-06-17 NOTE — Addendum Note (Signed)
Addended by: Steva Ready on: 06/17/2014 11:35 AM   Modules accepted: Level of Service

## 2014-09-09 ENCOUNTER — Encounter: Payer: Self-pay | Admitting: Internal Medicine

## 2014-09-09 ENCOUNTER — Ambulatory Visit (INDEPENDENT_AMBULATORY_CARE_PROVIDER_SITE_OTHER): Payer: 59 | Admitting: Internal Medicine

## 2014-09-09 VITALS — BP 132/70 | HR 58 | Temp 98.7°F | Resp 16 | Ht 71.0 in | Wt 343.0 lb

## 2014-09-09 DIAGNOSIS — M25511 Pain in right shoulder: Secondary | ICD-10-CM

## 2014-09-09 DIAGNOSIS — M7632 Iliotibial band syndrome, left leg: Secondary | ICD-10-CM | POA: Diagnosis not present

## 2014-09-09 DIAGNOSIS — I10 Essential (primary) hypertension: Secondary | ICD-10-CM

## 2014-09-09 NOTE — Patient Instructions (Signed)
We will get you in with sports medicine and have put the stretching exercises below so you can work on those before seeing him.   You are doing great with the blood pressure but need to get back in with the exercise to keep the weight under control and keep yourself healthy.   Come back in 3-6 months for a check up.   Iliotibial Band Syndrome with Rehab The iliotibial (IT) band is a tendon that connects the hip muscles to the shinbone (tibia) and to one of the bones of the pelvis (ileum). The IT band passes by the knee and is often irritated by the outer portion of the knee (lateral femoral condyle). A fluid filled sac (bursa) exists between the tendon and the bone, to cushion and reduce friction. Overuse of the tendon may cause excessive friction, which results in IT band syndrome. This condition involves inflammation of the bursa (bursitis) and/or inflammation of the IT band (tendinitis). SYMPTOMS   Pain, tenderness, swelling, warmth, or redness over the IT band, at the outer knee (above the joint).  Pain that travels up or down the thigh or leg.  Initially, pain at the beginning of an exercise, that decreases once warmed up. Eventually, pain throughout the activity, getting worse as the activity continues. May cause the athlete to stop in the middle of training or competing.  Pain that gets worse when running down hills or stairs, on banked tracks, or next to the curb on the street.  Pain that increases when the foot of the affected leg hits the ground.  Possibly, a crackling sound (crepitation) when the tendon or bursa is moved or touched. CAUSES  IT band syndrome is caused by irritation of the IT band and the underlying bursa. This eventually results in inflammation and pain. IT band syndrome is an overuse injury.  RISK INCREASES WITH:  Sports with repetitive knee-bending activities (distance running, cycling).  Incorrect training techniques, including sudden changes in the  intensity, frequency, or duration of training.  Not enough rest between workouts.  Poor strength and flexibility, especially a tight IT band.  Failure to warm up properly before activity.  Bow legs.  Arthritis of the knee. PREVENTION   Warm up and stretch properly before activity.  Allow for adequate recovery between workouts.  Maintain physical fitness:  Strength, flexibility, and endurance.  Cardiovascular fitness.  Learn and use proper training technique, including reducing running mileage, shortening stride, and avoiding running on hills and banked surfaces.  Wear arch supports (orthotics), if you have flat feet. PROGNOSIS  If treated properly, IT band syndrome usually goes away within 6 weeks of treatment. RELATED COMPLICATIONS   Longer healing time, if not properly treated, or if not given enough time to heal.  Recurring inflammation of the tendon and bursa, that may result in a chronic condition.  Recurring symptoms, if activity is resumed too soon, with overuse, with a direct blow, or with poor training technique.  Inability to complete training or competition. TREATMENT  Treatment first involves the use of ice and medicine, to reduce pain and inflammation. The use of strengthening and stretching exercises may help reduce pain with activity. These exercises may be performed at home or with a therapist. For individuals with flat feet, an arch support (orthotic) may be helpful. Some individuals find that wearing a knee sleeve or compression bandage around the knee during workouts provides some relief. Certain training techniques, such as adjusting stride length, avoiding running on hills or stairs, changing the  direction you run on a circular or banked track, or changing the side of the road you run on, if you run next to the curb, may help decrease symptoms of IT band syndrome. Cyclists may need to change the seat height or foot position on their bicycles. An injection of  cortisone into the bursa may be recommended. Surgery to remove the inflamed bursa and/or part of the IT band is only considered after at least 6 months of non-surgical treatment.  MEDICATION   If pain medicine is needed, nonsteroidal anti-inflammatory medicines (aspirin and ibuprofen), or other minor pain relievers (acetaminophen), are often advised.  Do not take pain medicine for 7 days before surgery.  Prescription pain relievers may be given, if your caregiver thinks they are needed. Use only as directed and only as much as you need.  Corticosteroid injections may be given by your caregiver. These injections should be reserved for the most serious cases, because they may only be given a certain number of times. HEAT AND COLD  Cold treatment (icing) should be applied for 10 to 15 minutes every 2 to 3 hours for inflammation and pain, and immediately after activity that aggravates your symptoms. Use ice packs or an ice massage.  Heat treatment may be used before performing stretching and strengthening activities prescribed by your caregiver, physical therapist, or athletic trainer. Use a heat pack or a warm water soak. SEEK MEDICAL CARE IF:   Symptoms get worse or do not improve in 2 to 4 weeks, despite treatment.  New, unexplained symptoms develop. (Drugs used in treatment may produce side effects.) EXERCISES  RANGE OF MOTION (ROM) AND STRETCHING EXERCISES - Iliotibial Band Syndrome These exercises may help you when beginning to rehabilitate your injury. Your symptoms may go away with or without further involvement from your physician, physical therapist or athletic trainer. While completing these exercises, remember:   Restoring tissue flexibility helps normal motion to return to the joints. This allows healthier, less painful movement and activity.  An effective stretch should be held for at least 30 seconds.  A stretch should never be painful. You should only feel a gentle lengthening  or release in the stretched tissue. STRETCH - Quadriceps, Prone   Lie on your stomach on a firm surface, such as a bed or padded floor.  Bend your right / left knee and grasp your ankle. If you are unable to reach your ankle or pant leg, use a belt around your foot to lengthen your reach.  Gently pull your heel toward your buttocks. Your knee should not slide out to the side. You should feel a stretch in the front of your thigh and knee.  Hold this position for __________ seconds. Repeat __________ times. Complete this stretch __________ times per day.  STRETCH - Iliotibial Band  On the floor or bed, lie on your side, so your right / left leg is on top. Bend your knee and grab your ankle.  Slowly bring your knee back so that your thigh is in line with your trunk. Keep your heel at your buttocks and gently arch your back, so your head, shoulders and hips line up.  Slowly lower your leg so that your knee approaches the floor or bed, until you feel a gentle stretch on the outside of your right / left thigh. If you do not feel a stretch and your knee will not fall farther, place the heel of your opposite foot on top of your knee, and pull your thigh  down farther.  Hold this stretch for __________ seconds. Repeat __________ times. Complete this stretch __________ times per day. STRENGTHENING EXERCISES - Iliotibial Band Syndrome Improving the flexibility of the IT band will best relieve your discomfort due to IT band syndrome. Strengthening exercises, however, can help improve both muscle endurance and joint mechanics, reducing the factors that can contribute to this condition. Your physician, physical therapist or athletic trainer may provide you with exercises that train specific muscle groups that are especially weak. The following exercises target muscles that are often weak in people who have IT band syndrome. STRENGTH - Hip Abductors, Straight Leg Raises  Be aware of your form throughout the  entire exercise, so that you exercise the correct muscles. Poor form means that you are not strengthening the correct muscles.  Lie on your side, so that your head, shoulders, knee and hip line up. You may bend your lower knee to help maintain your balance. Your right / left leg should be on top.  Roll your hips slightly forward, so that your hips are stacked directly over each other and your right / left knee is facing forward.  Lift your top leg up 4-6 inches, leading with your heel. Be sure that your foot does not drift forward and that your knee does not roll toward the ceiling.  Hold this position for __________ seconds. You should feel the muscles in your outer hip lifting (you may not notice this until your leg begins to tire).  Slowly lower your leg to the starting position. Allow the muscles to fully relax before beginning the next repetition. Repeat __________ times. Complete this exercise __________ times per day.  STRENGTH - Quad/VMO, Isometric  Sit in a chair with your right / left knee slightly bent. With your fingertips, feel the VMO muscle (just above the inside of your knee). The VMO is important in controlling the position of your kneecap.  Keeping your fingertips on this muscle. Without actually moving your leg, attempt to drive your knee down, as if straightening your leg. You should feel your VMO tense. If you have a difficult time, you may wish to try the same exercise on your healthy knee first.  Tense this muscle as hard as you can, without increasing any knee pain.  Hold for __________ seconds. Relax the muscles slowly and completely between each repetition. Repeat __________ times. Complete this exercise __________ times per day.  Document Released: 12/31/2004 Document Revised: 03/25/2011 Document Reviewed: 04/14/2008 [facility] Patient Information 2015 [place], [place]. This information is not intended to replace advice given to you by your health care provider. Make  sure you discuss any questions you have with your health care provider.

## 2014-09-09 NOTE — Progress Notes (Signed)
Pre visit review using our clinic review tool, if applicable. No additional management support is needed unless otherwise documented below in the visit note. 

## 2014-09-11 DIAGNOSIS — M25511 Pain in right shoulder: Secondary | ICD-10-CM | POA: Insufficient documentation

## 2014-09-11 DIAGNOSIS — M7630 Iliotibial band syndrome, unspecified leg: Secondary | ICD-10-CM | POA: Insufficient documentation

## 2014-09-11 NOTE — Progress Notes (Signed)
   Subjective:    Patient ID: Rose Miller, female    DOB: 1955/11/17, 59 y.o.   MRN: [id number]  HPI The patient is a 59 YO female coming in for follow up of her weight and blood pressure and with new problem of shoulder and leg pain. Previously she was doing well and losing weight. She has now put on about 10 pounds since last time. She is not exercising much lately and her food has been only okay and not great. She knows she needs to get motivated.  Her BP is good and no side effects with the medicine. No fatigue or SOB, no headaches or chest pains.  The new problem of shoulder pain has been going on several years but has worsened lately. This is the first time she has brought it up. Daily 4/10 pain. Trying tylenol for it with some relief. Not changing her ROM much although slightly limiting it. The leg pain is in the last 5 days, denies injury or pulling something. 6/10 pain. Worse with walking or activity. Sore to the touch to sleep on. Radiates to her butt region but not into her feet.   Review of Systems  Constitutional: Positive for activity change. Negative for fever, chills, appetite change, fatigue and unexpected weight change.       Not exercising  Eyes: Negative for photophobia, redness and visual disturbance.  Respiratory: Negative for cough, chest tightness, shortness of breath and wheezing.   Cardiovascular: Negative for chest pain, palpitations and leg swelling.  Gastrointestinal: Negative for nausea, abdominal pain, diarrhea and abdominal distention.  Musculoskeletal: Positive for myalgias and arthralgias.  Skin: Negative.   Neurological: Negative.       Objective:   Physical Exam  Constitutional: She is oriented to person, place, and time. She appears well-developed and well-nourished.  Overweight  HENT:  Head: Normocephalic and atraumatic.  Cardiovascular: Normal rate and regular rhythm.   Murmur heard. Systolic murmur radiating to carotids  Pulmonary/Chest: Effort  normal and breath sounds normal. No respiratory distress. She has no wheezes.  Abdominal: Soft. Bowel sounds are normal. She exhibits no distension. There is no tenderness.  Musculoskeletal: She exhibits tenderness. She exhibits no edema.  Tenderness along the left IT band, right shoulder at the [facility] joint. ROM intact but not as full as the left shoulder.   Neurological: She is alert and oriented to person, place, and time. Coordination normal.   Filed Vitals:   09/09/14 1333  BP: 132/70  Pulse: 58  Temp: 98.7 F (37.1 C)  TempSrc: Oral  Resp: 16  Height: 5\' 11"  (1.803 m)  Weight: 343 lb (155.584 kg)  SpO2: 92%      Assessment & Plan:

## 2014-09-11 NOTE — Assessment & Plan Note (Signed)
Referral to sports medicine for possible injection. Is in pain daily, no acute injury, ligaments intact and in proper position.

## 2014-09-11 NOTE — Assessment & Plan Note (Signed)
>>  ASSESSMENT AND PLAN FOR MORBID OBESITY (HCC) WRITTEN ON 09/11/2014  8:16 AM BY Dorise Hiss, ELIZABETH A, MD  In exacerbation, weight up about 10 pounds since last visit. She is not exercising and not watching her food intake at all. She knows she needs to get motivated for change so she can do more and at least start exercising even if the weight is not changed significantly.

## 2014-09-11 NOTE — Assessment & Plan Note (Signed)
BP at goal, continue atenolol and amlodipine.

## 2014-09-11 NOTE — Assessment & Plan Note (Signed)
Given location and description this is the most likely. Given stretching and rehab exercises for it. Referral to sports medicine for evaluation if not improved.

## 2014-09-11 NOTE — Assessment & Plan Note (Signed)
In exacerbation, weight up about 10 pounds since last visit. She is not exercising and not watching her food intake at all. She knows she needs to get motivated for change so she can do more and at least start exercising even if the weight is not changed significantly.

## 2014-09-29 ENCOUNTER — Other Ambulatory Visit (INDEPENDENT_AMBULATORY_CARE_PROVIDER_SITE_OTHER): Payer: 59

## 2014-09-29 ENCOUNTER — Ambulatory Visit (INDEPENDENT_AMBULATORY_CARE_PROVIDER_SITE_OTHER): Payer: 59 | Admitting: Family Medicine

## 2014-09-29 ENCOUNTER — Encounter: Payer: Self-pay | Admitting: Family Medicine

## 2014-09-29 VITALS — BP 132/78 | HR 62 | Wt 341.0 lb

## 2014-09-29 DIAGNOSIS — M7501 Adhesive capsulitis of right shoulder: Secondary | ICD-10-CM | POA: Diagnosis not present

## 2014-09-29 DIAGNOSIS — M25511 Pain in right shoulder: Secondary | ICD-10-CM | POA: Diagnosis not present

## 2014-09-29 NOTE — Patient Instructions (Signed)
Good to see you.  Ice 20 minutes 2 times daily. Usually after activity and before bed. Exercises 3 times a week.  Vitamin D 2000 IU daily pennsaid pinkie amount topically 2 times daily as needed.  See me again in 3-4 weeks.

## 2014-09-29 NOTE — Progress Notes (Signed)
[facility] [address], [name] Phone: [phone] Subjective:    I'm seeing this patient by the request  of:  Olga Millers, MD   CC:  Right shoulder pain  LEX:[id number] Rose Miller is a 59 y.o. female coming in with complaint of right shoulder pain.  Patient has had this pain for multiple months if not years. Patient does not remember any specific injury. Hurts when trying to reach above her head or even biter back. Has noticed that she may have decreased range of motion. Sometimes radiation down the arm towards her hand but never to the fingers. Patient is a severity pain a 7 out of 10. Denies any weakness. Patient is left-handed. Patient states sometimes at night it can be uncomfortable to follow sleep at that wakes her up. Starting affects some of her daily activities.   Past Medical History  Diagnosis Date  . Asthma   . Arthritis   . Allergy   . Heart murmur   . Hypertension    Past Surgical History  Procedure Laterality Date  . Cesarean section  1989  . Tonsillectomy and adenoidectomy  1960   Social History  Substance Use Topics  . Smoking status: Former Research scientist (life sciences)  . Smokeless tobacco: None  . Alcohol Use: No   No Known Allergies Family History  Problem Relation Age of Onset  . Arthritis Mother   . Cancer Mother     pancreatic  . Hypertension Mother   . Cancer Father     prostate and colon  . Colon cancer Father        Past medical history, social, surgical and family history all reviewed in electronic medical record.   Review of Systems: No headache, visual changes, nausea, vomiting, diarrhea, constipation, dizziness, abdominal pain, skin rash, fevers, chills, night sweats, weight loss, swollen lymph nodes, body aches, joint swelling, muscle aches, chest pain, shortness of breath, mood changes.   Objective Blood pressure 132/78, pulse 62, weight 341 lb (154.677 kg).  General: No apparent distress  alert and oriented x3 mood and affect normal, dressed appropriately.  HEENT: Pupils equal, extraocular movements intact  Respiratory: Patient's speak in full sentences and does not appear short of breath  Cardiovascular: No lower extremity edema, non tender, no erythema  Skin: Warm dry intact with no signs of infection or rash on extremities or on axial skeleton.  Abdomen: Soft nontender  Neuro: Cranial nerves II through XII are intact, neurovascularly intact in all extremities with 2+ DTRs and 2+ pulses.  Lymph: No lymphadenopathy of posterior or anterior cervical chain or axillae bilaterally.  Gait normal with good balance and coordination.  MSK:  Non tender with full range of motion and good stability and symmetric strength and tone of elbows, wrist, hip, knee and ankles bilaterally.   Shoulder: left Inspection reveals no abnormalities, atrophy or asymmetry. Palpation is normal with no tenderness over AC joint or bicipital groove. ROM is full in all planes passively. Last 10 of forward flexion only has 5 of external rotation and internal rotation to the sacrum signs of impingement with positive Neer and Hawkin's tests, but negative empty can sign. Speeds and Yergason's tests normal. No labral pathology noted with negative Obrien's, negative clunk and good stability. Normal scapular function observed. No painful arc and no drop arm sign. No apprehension sign  MSK US performed of: left This study was ordered, performed, and interpreted by Charlann Boxer D.O.  Shoulder:   Supraspinatus:  Appears normal on long and transverse views, Bursal bulge seen with shoulder abduction on impingement view. Infraspinatus:  Appears normal on long and transverse views. Significant increase in Doppler flow Subscapularis:  Appears normal on long and transverse views. Positive bursa Teres Minor:  Appears normal on long and transverse views. AC joint:  Capsule undistended, no geyser sign. Glenohumeral  Joint:  Appears normal without effusion. Glenoid Labrum:  Intact without visualized tears. Biceps Tendon:  Appears normal on long and transverse views, no fraying of tendon, tendon located in intertubercular groove, no subluxation with shoulder internal or external rotation.  Impression: Subacromial bursitis with early frozen shoulder  Procedure: Real-time Ultrasound Guided Injection of left glenohumeral joint Device: GE Logiq E  Ultrasound guided injection is preferred based studies that show increased duration, increased effect, greater accuracy, decreased procedural pain, increased response rate with ultrasound guided versus blind injection.  Verbal informed consent obtained.  Time-out conducted.  Noted no overlying erythema, induration, or other signs of local infection.  Skin prepped in a sterile fashion.  Local anesthesia: Topical Ethyl chloride.  With sterile technique and under real time ultrasound guidance:  Joint visualized.  23g 1  inch needle inserted posterior approach. Pictures taken for needle placement. Patient did have injection of 2 cc of 1% lidocaine, 2 cc of 0.5% Marcaine, and 1.0 cc of Kenalog 40 mg/dL. Completed without difficulty  Pain immediately resolved suggesting accurate placement of the medication.  Advised to call if fevers/chills, erythema, induration, drainage, or persistent bleeding.  Images permanently stored and available for review in the ultrasound unit.  Impression: Technically successful ultrasound guided injection.  Procedure note 50932; 15 minutes spent for Therapeutic exercises as stated in above notes.  This included exercises focusing on stretching, strengthening, with significant focus on eccentric aspects. Shoulder Exercises that included:  Basic scapular stabilization to include adduction and depression of scapula Scaption, focusing on proper movement and good control Internal and External rotation utilizing a theraband, with elbow tucked at  side entire time Rows with theraband  Proper technique shown and discussed handout in great detail with ATC.  All questions were discussed and answered.      Impression and Recommendations:     This case required medical decision making of moderate complexity.

## 2014-09-29 NOTE — Assessment & Plan Note (Signed)
Patient was given an injection today. Patient would do home exercises and work with Product/process development scientist. Patient will try topical anti-inflammatory.we discussed possible formal physical therapy which patient declined. Patient will try icing. Patient come back and see me again in 3-4 weeks for further evaluation and treatment.

## 2014-10-25 ENCOUNTER — Ambulatory Visit (INDEPENDENT_AMBULATORY_CARE_PROVIDER_SITE_OTHER): Payer: 59 | Admitting: Family Medicine

## 2014-10-25 ENCOUNTER — Encounter: Payer: Self-pay | Admitting: Family Medicine

## 2014-10-25 VITALS — BP 112/80 | HR 49 | Ht 71.0 in | Wt 339.0 lb

## 2014-10-25 DIAGNOSIS — M7501 Adhesive capsulitis of right shoulder: Secondary | ICD-10-CM

## 2014-10-25 NOTE — Assessment & Plan Note (Signed)
Patient is doing significantly better at this time. We discussed icing regimen and home exercises still for the next 6 weeks. If patient has any worsening symptoms she'll follow-up with me again.

## 2014-10-25 NOTE — Progress Notes (Signed)
  [facility] [address], [place] [address] Phone: [phone] Subjective:    I'm seeing this patient by the request  of:  Hoyt Koch, MD   CC:  Right shoulder pain follow up  PXT:[id number] Rose Miller is a 59 y.o. female coming in with complaint of right shoulder pain. Patient was seen one month ago and was given a steroid injection in her shoulder. This is for [facility] to be an early frozen shoulder. Patient was given home exercises, icing protocol, topical anti-inflammatories. Patient states overall doing significantly better. States that she is 100% better. Not giving her any trouble during the day. Not waking her up at night. Doing the exercises occasionally.   Past Medical History  Diagnosis Date  . Asthma   . Arthritis   . Allergy   . Heart murmur   . Hypertension    Past Surgical History  Procedure Laterality Date  . Cesarean section  1989  . Tonsillectomy and adenoidectomy  1960   Social History  Substance Use Topics  . Smoking status: Former Research scientist (life sciences)  . Smokeless tobacco: None  . Alcohol Use: No   No Known Allergies Family History  Problem Relation Age of Onset  . Arthritis Mother   . Cancer Mother     pancreatic  . Hypertension Mother   . Cancer Father     prostate and colon  . Colon cancer Father        Past medical history, social, surgical and family history all reviewed in electronic medical record.   Review of Systems: No headache, visual changes, nausea, vomiting, diarrhea, constipation, dizziness, abdominal pain, skin rash, fevers, chills, night sweats, weight loss, swollen lymph nodes, body aches, joint swelling, muscle aches, chest pain, shortness of breath, mood changes.   Objective Blood pressure 112/80, pulse 49, height 5\' 11"  (1.803 m), weight 339 lb (153.769 kg), SpO2 92 %.  General: No apparent distress alert and oriented x3 mood and affect normal, dressed appropriately.  HEENT: Pupils  equal, extraocular movements intact  Respiratory: Patient's speak in full sentences and does not appear short of breath  Cardiovascular: No lower extremity edema, non tender, no erythema  Skin: Warm dry intact with no signs of infection or rash on extremities or on axial skeleton.  Abdomen: Soft nontender  Neuro: Cranial nerves II through XII are intact, neurovascularly intact in all extremities with 2+ DTRs and 2+ pulses.  Lymph: No lymphadenopathy of posterior or anterior cervical chain or axillae bilaterally.  Gait normal with good balance and coordination.  MSK:  Non tender with full range of motion and good stability and symmetric strength and tone of elbows, wrist, hip, knee and ankles bilaterally.   Shoulder: Right Inspection reveals no abnormalities, atrophy or asymmetry. Palpation is normal with no tenderness over AC joint or bicipital groove. ROM is full in all planes passively. Full range of motion which is significant improvement from previous exam Minimal impingement sign still noted Speeds and Yergason's tests normal. No labral pathology noted with negative Obrien's, negative clunk and good stability. Normal scapular function observed. No painful arc and no drop arm sign. No apprehension sign Contralateral shoulder unremarkable.      Impression and Recommendations:     This case required medical decision making of moderate complexity.

## 2014-10-25 NOTE — Progress Notes (Signed)
Pre visit review using our clinic review tool, if applicable. No additional management support is needed unless otherwise documented below in the visit note. 

## 2014-10-25 NOTE — Patient Instructions (Addendum)
Good to see you Both shoulders are great! Ice is your friend at night Continue the exercises 2-3 times a week See me when you need me.

## 2014-12-13 ENCOUNTER — Ambulatory Visit: Payer: 59 | Admitting: Internal Medicine

## 2014-12-19 ENCOUNTER — Ambulatory Visit (INDEPENDENT_AMBULATORY_CARE_PROVIDER_SITE_OTHER): Payer: 59 | Admitting: Internal Medicine

## 2014-12-19 ENCOUNTER — Encounter: Payer: Self-pay | Admitting: Internal Medicine

## 2014-12-19 ENCOUNTER — Other Ambulatory Visit (INDEPENDENT_AMBULATORY_CARE_PROVIDER_SITE_OTHER): Payer: 59

## 2014-12-19 VITALS — BP 140/80 | HR 43 | Temp 98.0°F | Resp 12 | Ht 71.0 in | Wt 345.0 lb

## 2014-12-19 DIAGNOSIS — I10 Essential (primary) hypertension: Secondary | ICD-10-CM

## 2014-12-19 DIAGNOSIS — Z23 Encounter for immunization: Secondary | ICD-10-CM | POA: Diagnosis not present

## 2014-12-19 LAB — COMPREHENSIVE METABOLIC PANEL
ALK PHOS: 76 U/L (ref 39–117)
ALT: 17 U/L (ref 0–35)
AST: 15 U/L (ref 0–37)
Albumin: 4.1 g/dL (ref 3.5–5.2)
BUN: 22 mg/dL (ref 6–23)
CALCIUM: 9.9 mg/dL (ref 8.4–10.5)
CHLORIDE: 108 meq/L (ref 96–112)
CO2: 26 mEq/L (ref 19–32)
Creatinine, Ser: 1.23 mg/dL — ABNORMAL HIGH (ref 0.40–1.20)
GFR: 57.09 mL/min — AB (ref >60.00)
Glucose, Bld: 86 mg/dL (ref 70–99)
POTASSIUM: 4.2 meq/L (ref 3.5–5.1)
Sodium: 141 mEq/L (ref 135–145)
TOTAL PROTEIN: 7.8 g/dL (ref 6.0–8.3)
Total Bilirubin: 0.4 mg/dL (ref 0.2–1.2)

## 2014-12-19 NOTE — Patient Instructions (Signed)
We will have you take 1/2 pill of the atenolol daily. This will help with the tiredness.   We have given you the tetanus and the flu shot today.   We are checking blood work and will call you back with the results.

## 2014-12-19 NOTE — Progress Notes (Signed)
Pre visit review using our clinic review tool, if applicable. No additional management support is needed unless otherwise documented below in the visit note. 

## 2014-12-20 LAB — HEPATITIS C ANTIBODY: HCV AB: NEGATIVE

## 2014-12-20 NOTE — Assessment & Plan Note (Signed)
>>  ASSESSMENT AND PLAN FOR MORBID OBESITY ([facility]) WRITTEN ON 12/20/2014 10:44 AM BY CRAWFORD, ELIZABETH A, MD  Weight is up several pounds and talked to her about the importance of exercising to help with her future health.

## 2014-12-20 NOTE — Assessment & Plan Note (Signed)
BP doing well but HR slightly low. Will decrease atenolol to 1/2 pill daily. Checking BMP today to make sure no kidney insufficiency has returned. Adjust as needed.

## 2014-12-20 NOTE — Progress Notes (Signed)
   Subjective:    Patient ID: Rose Miller, female    DOB: 07/30/1955, 59 y.o.   MRN: [id number]  HPI The patient is a 59 YO female coming in for follow up of her blood pressure. She is still taking her medicines as prescribed. She is having occasional dizziness where she feels lightheaded. No syncope. Eating and drinking normally. Not exercising and the weight is slightly up from last visit. No new concerns.   Review of Systems  Constitutional: Positive for activity change. Negative for fever, chills, appetite change, fatigue and unexpected weight change.       Not exercising  Eyes: Negative for photophobia, redness and visual disturbance.  Respiratory: Negative for cough, chest tightness, shortness of breath and wheezing.   Cardiovascular: Negative for chest pain, palpitations and leg swelling.  Gastrointestinal: Negative for nausea, abdominal pain, diarrhea and abdominal distention.  Musculoskeletal: Positive for arthralgias.  Skin: Negative.   Neurological: Negative.       Objective:   Physical Exam  Constitutional: She is oriented to person, place, and time. She appears well-developed and well-nourished.  Overweight  HENT:  Head: Normocephalic and atraumatic.  Eyes: EOM are normal.  Neck: Normal range of motion.  Cardiovascular: Normal rate and regular rhythm.   Murmur heard. Systolic murmur radiating to carotids  Pulmonary/Chest: Effort normal and breath sounds normal. No respiratory distress. She has no wheezes.  Abdominal: Soft. Bowel sounds are normal. She exhibits no distension. There is no tenderness.  Musculoskeletal: She exhibits no edema.  Neurological: She is alert and oriented to person, place, and time. Coordination normal.  Skin: Skin is warm and dry.   Filed Vitals:   12/19/14 1449  BP: 140/80  Pulse: 43  Temp: 98 F (36.7 C)  TempSrc: Oral  Resp: 12  Height: 5\' 11"  (1.803 m)  Weight: 345 lb (156.491 kg)  SpO2: 98%      Assessment & Plan:  Flu  and Tdap shot given at visit.

## 2014-12-20 NOTE — Assessment & Plan Note (Signed)
Weight is up several pounds and talked to her about the importance of exercising to help with her future health.

## 2015-04-18 ENCOUNTER — Other Ambulatory Visit: Payer: Self-pay | Admitting: Internal Medicine

## 2015-04-19 ENCOUNTER — Other Ambulatory Visit: Payer: Self-pay | Admitting: Internal Medicine

## 2015-10-10 ENCOUNTER — Other Ambulatory Visit: Payer: Self-pay | Admitting: Internal Medicine

## 2015-10-12 ENCOUNTER — Other Ambulatory Visit: Payer: Self-pay | Admitting: Internal Medicine

## 2015-10-12 DIAGNOSIS — Z1231 Encounter for screening mammogram for malignant neoplasm of breast: Secondary | ICD-10-CM

## 2015-10-13 ENCOUNTER — Telehealth: Payer: Self-pay | Admitting: Internal Medicine

## 2015-10-13 MED ORDER — METOPROLOL SUCCINATE ER 25 MG PO TB24
25.0000 mg | ORAL_TABLET | Freq: Every day | ORAL | 3 refills | Status: DC
Start: 2015-10-13 — End: 2016-03-29

## 2015-10-13 NOTE — Telephone Encounter (Signed)
Sent in metoprolol x-l which can temporarily replace the atenolol. Take 1 pill daily.

## 2015-10-13 NOTE — Telephone Encounter (Signed)
Pt called in and said there is a back order on the atenolol (TENORMIN) 25 MG tablet [id number]  She wants to know if there is anything else that can be called in.  She said that she has 1 pill and is wanting something today ?

## 2015-10-16 NOTE — Telephone Encounter (Signed)
Patient is taking metoprolol. She said she had been taking 1/2 pill atenolol daily, so she is taking 1/2 of the metoprolol. Is this ok? Also, she took Advil PM the first night that she got the metoprolol and she said it made her heart race. She wants to know if Advil PM is safe to take with the metoprolol? Please advise, thanks.

## 2015-10-16 NOTE — Telephone Encounter (Signed)
It is okay to split the metoprolol. It should be safe to take advil pm however this is advil and benadryl so if she is having pain she can take just advil instead of the advil pm since benadryl can give people side effects.

## 2015-10-16 NOTE — Telephone Encounter (Signed)
Tried to reach patient, no answer and no voice mail  

## 2015-10-19 ENCOUNTER — Ambulatory Visit
Admission: RE | Admit: 2015-10-19 | Discharge: 2015-10-19 | Disposition: A | Discharge status: Home / Self Care | Payer: BLUE CROSS/BLUE SHIELD | Source: Ambulatory Visit | Attending: Internal Medicine | Admitting: Internal Medicine

## 2015-10-19 DIAGNOSIS — Z1231 Encounter for screening mammogram for malignant neoplasm of breast: Secondary | ICD-10-CM

## 2015-10-23 ENCOUNTER — Telehealth: Payer: Self-pay | Admitting: Internal Medicine

## 2015-10-23 ENCOUNTER — Other Ambulatory Visit: Payer: Self-pay | Admitting: Internal Medicine

## 2015-10-23 NOTE — Telephone Encounter (Signed)
Tried to reach patient. No answer and voice mail has not been set up on preferred number and phone was busy on the home number.

## 2015-10-23 NOTE — Telephone Encounter (Signed)
Patient says the metoprolol is making her heart flutter and it is making her nervous. She has been on it for 2 weeks. She was only taking it due to atenolol not being available.  She wants to go back on atenolol. She called [facility] on [address] and they have it in stock. Patient did not take the metoprolol today and wants atenolol sent in so she will not have a lapse in taking her medications. Please advise in Dr. Nathanial Millman absence, thanks.

## 2015-10-23 NOTE — Telephone Encounter (Signed)
Pt returned you call. Please give her a call back when you get a chance. Thanks.

## 2015-10-23 NOTE — Telephone Encounter (Signed)
Pt called in and would like a nurse to call her about her meds.    Best number [phone]

## 2015-10-25 NOTE — Telephone Encounter (Signed)
Pt is calling back about this problem. Please give her a call back ASAP. Thanks.

## 2015-10-25 NOTE — Telephone Encounter (Signed)
Dr. Sharlet Salina, please advise on atenolol refill, thanks.

## 2015-10-26 ENCOUNTER — Other Ambulatory Visit: Payer: Self-pay | Admitting: Geriatric Medicine

## 2015-10-26 NOTE — Telephone Encounter (Signed)
Please send in to the proper pharmacy. Why did this message wait 3 days to be routed to me?

## 2015-10-26 NOTE — Telephone Encounter (Signed)
Patient said she got the medication from [facility] already.

## 2016-02-05 ENCOUNTER — Other Ambulatory Visit: Payer: Self-pay | Admitting: *Deleted

## 2016-02-05 NOTE — Telephone Encounter (Signed)
Pt left msg on triage requesting refill on her Amlodipine. Refill was denied due to pt haven't seen MD since 12/2014....Johny Chess

## 2016-02-08 ENCOUNTER — Telehealth: Payer: Self-pay | Admitting: Internal Medicine

## 2016-02-08 ENCOUNTER — Telehealth: Payer: Self-pay

## 2016-02-08 MED ORDER — AMLODIPINE BESYLATE 10 MG PO TABS: 10.0000 mg | ORAL_TABLET | Freq: Every day | ORAL | 0 refills | Status: DC

## 2016-02-08 NOTE — Telephone Encounter (Signed)
Fine with me

## 2016-02-08 NOTE — Telephone Encounter (Signed)
Pt would like to transfer from [place] to [place].  Is this ok with both of you

## 2016-02-08 NOTE — Telephone Encounter (Signed)
Ok with me 

## 2016-02-08 NOTE — Telephone Encounter (Signed)
Patient hadnt been seen is in a year, sent in 30 days and told patient to make an appointment

## 2016-02-09 ENCOUNTER — Telehealth: Payer: Self-pay

## 2016-02-09 MED ORDER — AMLODIPINE BESYLATE 10 MG PO TABS: 10.0000 mg | ORAL_TABLET | Freq: Every day | ORAL | 0 refills | Status: DC

## 2016-02-09 NOTE — Telephone Encounter (Signed)
Medication Amlodipine request for 90 day supply by pharmacy

## 2016-03-07 ENCOUNTER — Telehealth: Payer: Self-pay | Admitting: Internal Medicine

## 2016-03-07 NOTE — Telephone Encounter (Signed)
Pt is needing a refill of her amLODipine (NORVASC) 10 MG tablet [id number]

## 2016-03-07 NOTE — Telephone Encounter (Signed)
Medication already at pharmacy

## 2016-03-29 ENCOUNTER — Other Ambulatory Visit (HOSPITAL_COMMUNITY)
Admission: RE | Admit: 2016-03-29 | Discharge: 2016-03-29 | Disposition: A | Discharge status: Home / Self Care | Payer: BLUE CROSS/BLUE SHIELD | Source: Ambulatory Visit | Attending: Nurse Practitioner | Admitting: Nurse Practitioner

## 2016-03-29 ENCOUNTER — Ambulatory Visit (INDEPENDENT_AMBULATORY_CARE_PROVIDER_SITE_OTHER): Payer: BLUE CROSS/BLUE SHIELD | Admitting: Nurse Practitioner

## 2016-03-29 ENCOUNTER — Other Ambulatory Visit (INDEPENDENT_AMBULATORY_CARE_PROVIDER_SITE_OTHER): Payer: BLUE CROSS/BLUE SHIELD

## 2016-03-29 ENCOUNTER — Encounter: Payer: Self-pay | Admitting: Nurse Practitioner

## 2016-03-29 VITALS — BP 146/100 | HR 55 | Temp 97.9°F | Ht 71.0 in | Wt 337.0 lb

## 2016-03-29 DIAGNOSIS — Z0001 Encounter for general adult medical examination with abnormal findings: Secondary | ICD-10-CM

## 2016-03-29 DIAGNOSIS — E785 Hyperlipidemia, unspecified: Secondary | ICD-10-CM

## 2016-03-29 DIAGNOSIS — J8401 Alveolar proteinosis: Secondary | ICD-10-CM

## 2016-03-29 DIAGNOSIS — I10 Essential (primary) hypertension: Secondary | ICD-10-CM | POA: Diagnosis not present

## 2016-03-29 DIAGNOSIS — I350 Nonrheumatic aortic (valve) stenosis: Secondary | ICD-10-CM | POA: Insufficient documentation

## 2016-03-29 DIAGNOSIS — Z124 Encounter for screening for malignant neoplasm of cervix: Secondary | ICD-10-CM

## 2016-03-29 DIAGNOSIS — Z136 Encounter for screening for cardiovascular disorders: Secondary | ICD-10-CM

## 2016-03-29 DIAGNOSIS — Z13220 Encounter for screening for lipoid disorders: Secondary | ICD-10-CM | POA: Diagnosis not present

## 2016-03-29 DIAGNOSIS — Z01419 Encounter for gynecological examination (general) (routine) without abnormal findings: Secondary | ICD-10-CM | POA: Diagnosis present

## 2016-03-29 DIAGNOSIS — K436 Other and unspecified ventral hernia with obstruction, without gangrene: Secondary | ICD-10-CM | POA: Diagnosis not present

## 2016-03-29 DIAGNOSIS — R1033 Periumbilical pain: Secondary | ICD-10-CM | POA: Diagnosis not present

## 2016-03-29 DIAGNOSIS — R011 Cardiac murmur, unspecified: Secondary | ICD-10-CM

## 2016-03-29 LAB — COMPREHENSIVE METABOLIC PANEL
ALK PHOS: 72 U/L (ref 39–117)
ALT: 10 U/L (ref 0–35)
AST: 13 U/L (ref 0–37)
Albumin: 4.3 g/dL (ref 3.5–5.2)
BILIRUBIN TOTAL: 0.5 mg/dL (ref 0.2–1.2)
BUN: 14 mg/dL (ref 6–23)
CALCIUM: 9.9 mg/dL (ref 8.4–10.5)
CO2: 28 meq/L (ref 19–32)
Chloride: 103 mEq/L (ref 96–112)
Creatinine, Ser: 1.25 mg/dL — ABNORMAL HIGH (ref 0.40–1.20)
GFR: 55.8 mL/min — AB (ref >60.00)
Glucose, Bld: 104 mg/dL — ABNORMAL HIGH (ref 70–99)
Potassium: 3.9 mEq/L (ref 3.5–5.1)
Sodium: 139 mEq/L (ref 135–145)
TOTAL PROTEIN: 8.2 g/dL (ref 6.0–8.3)

## 2016-03-29 LAB — LIPID PANEL
CHOL/HDL RATIO: 4
CHOLESTEROL: 228 mg/dL — AB (ref 0–200)
HDL: 54.5 mg/dL (ref >39.00)
LDL Cholesterol: 150 mg/dL — ABNORMAL HIGH (ref 0–99)
NonHDL: 173.17
TRIGLYCERIDES: 117 mg/dL (ref 0.0–149.0)
VLDL: 23.4 mg/dL (ref 0.0–40.0)

## 2016-03-29 LAB — TSH: TSH: 1.17 u[IU]/mL (ref 0.35–4.50)

## 2016-03-29 LAB — HEMOGLOBIN A1C: HEMOGLOBIN A1C: 6.3 % (ref 4.6–6.5)

## 2016-03-29 MED ORDER — AMLODIPINE BESYLATE 10 MG PO TABS: 10.0000 mg | ORAL_TABLET | Freq: Every day | ORAL | 1 refills | Status: DC

## 2016-03-29 MED ORDER — ATENOLOL 25 MG PO TABS: 12.5000 mg | ORAL_TABLET | Freq: Every day | ORAL | 1 refills | Status: DC

## 2016-03-29 NOTE — Patient Instructions (Addendum)
You will be called with appt for ABD CT.  Go to lab for blood draw. You will be called with results

## 2016-03-29 NOTE — Progress Notes (Signed)
Pre visit review using our clinic review tool, if applicable. No additional management support is needed unless otherwise documented below in the visit note. 

## 2016-03-29 NOTE — Progress Notes (Signed)
Subjective:    Patient ID: Rose Miller, female    DOB: 11/11/1955, 61 y.o.   MRN: [id number]  Patient presents today for complete physical exam  Abdominal Pain  This is a chronic problem. The current episode started more than 1 month ago. The onset quality is gradual. The problem occurs constantly. The problem has been gradually worsening. The pain is located in the periumbilical region. The quality of the pain is dull and a sensation of fullness. The abdominal pain does not radiate. Pertinent negatives include no anorexia, belching, constipation, diarrhea, dysuria, fever, frequency, headaches, hematochezia, hematuria, melena, myalgias, nausea, vomiting or weight loss. The pain is aggravated by palpation. She has tried nothing for the symptoms. ventral hernia    Immunizations: (TDAP, Hep C screen, Pneumovax, Influenza, zoster)  Health Maintenance  Topic Date Due  . HIV Screening  12/29/1968  . Pap Smear  12/30/1974  . Flu Shot  04/13/2016*  . Mammogram  10/18/2017  . Colon Cancer Screening  06/07/2019  . Tetanus Vaccine  12/18/2024  .  Hepatitis C: One time screening is recommended by Center for Disease Control  (CDC) for  adults born from [birth year] through 1967.   Completed  *Topic was postponed. The date shown is not the original due date.   Diet:regular Weight:  Wt Readings from Last 3 Encounters:  03/29/16 (!) 337 lb (152.9 kg)  12/19/14 (!) 345 lb (156.5 kg)  10/25/14 (!) 339 lb (153.8 kg)   Exercise:none Fall Risk: Fall Risk  03/29/2016  Falls in the past year? No   Home Safety:home with family Depression/Suicide: Depression screen [facility] 2/9 03/29/2016  Decreased Interest 0  Down, Depressed, Hopeless 0  PHQ - 2 Score 0   No flowsheet data found. Colonoscopy (every 5-34yrs, >50-69yrs):up to date Pap Smear (every 3yrs for >21-29 without HPV, every 34yrs for >30-82yrs with HPV):needed Mammogram (yearly, >62yrs):up to date Vision:needed, use of corrective lens, no  insurance. Dental:needed, no insurance Advanced Directive: Advanced Directives 06/07/2014  Does Patient Have a Medical Advance Directive? No  Would patient like information on creating a medical advance directive? -   Sexual History (birth control, marital status, STD):home with family, unemployed,   Medications and allergies reviewed with patient and updated if appropriate.  Patient Active Problem List   Diagnosis Date Noted  . Heart murmur on physical examination 03/29/2016  . Periumbilical pain 51/88/4166  . Adhesive capsulitis of right shoulder 09/29/2014  . IT band syndrome 09/11/2014  . Right shoulder pain 09/11/2014  . Ventral hernia 05/01/2014  . Essential hypertension 03/17/2014  . Morbid obesity ([place]) 03/17/2014    No current outpatient prescriptions on file prior to visit.   No current facility-administered medications on file prior to visit.     Past Medical History:  Diagnosis Date  . Allergy   . Arthritis   . Asthma   . Heart murmur   . Hypertension     Past Surgical History:  Procedure Laterality Date  . CESAREAN SECTION  1989  . TONSILLECTOMY AND ADENOIDECTOMY  1960    Social History   Social History  . Marital status: Single    Spouse name: N/A  . Number of children: N/A  . Years of education: N/A   Social History Main Topics  . Smoking status: Former Research scientist (life sciences)  . Smokeless tobacco: Never Used  . Alcohol use No  . Drug use: No  . Sexual activity: Not Asked   Other Topics Concern  . None   Social History  Narrative  . None    Family History  Problem Relation Age of Onset  . Arthritis Mother   . Cancer Mother     pancreatic  . Hypertension Mother   . Cancer Father     prostate and colon  . Colon cancer Father         Review of Systems  Constitutional: Negative for fever, malaise/fatigue and weight loss.  HENT: Positive for tinnitus. Negative for congestion, hearing loss and sore throat.   Eyes:       Negative for visual  changes  Respiratory: Negative for cough and shortness of breath.   Cardiovascular: Negative for chest pain, palpitations and leg swelling.  Gastrointestinal: Positive for abdominal pain. Negative for anorexia, blood in stool, constipation, diarrhea, heartburn, hematochezia, melena, nausea and vomiting.  Genitourinary: Negative for dysuria, frequency, hematuria and urgency.  Musculoskeletal: Negative for falls, joint pain and myalgias.  Skin: Negative for rash.  Neurological: Negative for dizziness, sensory change and headaches.  Endo/Heme/Allergies: Does not bruise/bleed easily.  Psychiatric/Behavioral: Negative for depression, substance abuse and suicidal ideas. The patient is not nervous/anxious.     Objective:   Vitals:   03/29/16 1400  BP: (!) 146/100  Pulse: (!) 55  Temp: 97.9 F (36.6 C)    Body mass index is 47 kg/m.   Physical Examination:  Physical Exam  Constitutional: She is oriented to person, place, and time and well-developed, well-nourished, and in no distress. No distress.  HENT:  Right Ear: External ear normal.  Left Ear: External ear normal.  Nose: Nose normal.  Mouth/Throat: No oropharyngeal exudate.  Eyes: Conjunctivae and EOM are normal. Pupils are equal, round, and reactive to light. No scleral icterus.  Neck: Normal range of motion. Neck supple. No thyromegaly present.  Cardiovascular: Normal rate, regular rhythm and intact distal pulses.  Exam reveals no gallop and no friction rub.   Murmur heard. Pulmonary/Chest: Effort normal and breath sounds normal. Right breast exhibits no mass, no nipple discharge and no skin change. Left breast exhibits no mass, no nipple discharge and no skin change. Breasts are symmetrical.  Abdominal: Soft. Bowel sounds are normal. She exhibits mass. She exhibits no distension. There is tenderness in the periumbilical area. There is guarding. There is no CVA tenderness. A hernia is present.    Genitourinary: Rectum normal,  vagina normal, cervix normal, right adnexa normal, left adnexa normal and vulva normal. Cervix exhibits no motion tenderness. No vaginal discharge found.  Musculoskeletal: Normal range of motion. She exhibits no edema or tenderness.  Lymphadenopathy:    She has no cervical adenopathy.  Neurological: She is alert and oriented to person, place, and time. Gait normal.  Skin: Skin is warm and dry.  Psychiatric: Affect and judgment normal.  Vitals reviewed.  ECG: Sinus Bradycardia.  ASSESSMENT and PLAN:  [place] was seen today for establish care.  Diagnoses and all orders for this visit:  Essential hypertension -     Comprehensive metabolic panel; Future -     EKG 12-Lead -     amLODipine (NORVASC) 10 MG tablet; Take 1 tablet (10 mg total) by mouth daily. -     atenolol (TENORMIN) 25 MG tablet; Take 0.5 tablets (12.5 mg total) by mouth daily.  Morbid obesity ([place])  Ventral hernia with obstruction and without gangrene -     CT ABDOMEN WO CONTRAST; Future  Encounter for lipid screening for cardiovascular disease -     Lipid panel; Future  Encounter for Papanicolaou smear for cervical  cancer screening -     Cytology - PAP; Future  Encounter for preventative adult health care exam with abnormal findings -     Lipid panel; Future -     Comprehensive metabolic panel; Future -     Cytology - PAP; Future -     TSH; Future -     Hemoglobin A1c; Future -     HIV antibody; Future -     EKG 18-FQMK  Periumbilical pain -     CT ABDOMEN WO CONTRAST; Future  Heart murmur on physical examination   No problem-specific Assessment & Plan notes found for this encounter.     Follow up: Return in about 3 months (around 06/29/2016) for HTN.  Wilfred Lacy, NP

## 2016-03-31 LAB — HIV ANTIBODY (ROUTINE TESTING W REFLEX): HIV: NONREACTIVE

## 2016-04-02 MED ORDER — ATORVASTATIN CALCIUM 10 MG PO TABS: 10.0000 mg | ORAL_TABLET | Freq: Every day | ORAL | 3 refills | Status: DC

## 2016-04-03 LAB — CYTOLOGY - PAP
Adequacy: ABSENT
Diagnosis: NEGATIVE
HPV: NOT DETECTED

## 2016-04-03 NOTE — Progress Notes (Signed)
Normal results, see office note

## 2016-04-09 ENCOUNTER — Ambulatory Visit (INDEPENDENT_AMBULATORY_CARE_PROVIDER_SITE_OTHER)
Admission: RE | Admit: 2016-04-09 | Discharge: 2016-04-09 | Disposition: A | Discharge status: Home / Self Care | Payer: BLUE CROSS/BLUE SHIELD | Source: Ambulatory Visit | Attending: Nurse Practitioner | Admitting: Nurse Practitioner

## 2016-04-09 DIAGNOSIS — K436 Other and unspecified ventral hernia with obstruction, without gangrene: Secondary | ICD-10-CM | POA: Diagnosis not present

## 2016-04-09 DIAGNOSIS — R1033 Periumbilical pain: Secondary | ICD-10-CM | POA: Diagnosis not present

## 2016-04-11 ENCOUNTER — Telehealth: Payer: Self-pay | Admitting: Nurse Practitioner

## 2016-04-11 DIAGNOSIS — K436 Other and unspecified ventral hernia with obstruction, without gangrene: Secondary | ICD-10-CM

## 2016-04-11 DIAGNOSIS — R1033 Periumbilical pain: Secondary | ICD-10-CM

## 2016-04-11 NOTE — Telephone Encounter (Signed)
-----   Message from Shawnie Pons, LPN sent at 4/35/3912 10:25 AM EDT ----- Pt verbalized understand of test result. Pt would like to proceed with referral to surgeon.

## 2016-05-02 ENCOUNTER — Ambulatory Visit: Payer: Self-pay | Admitting: General Surgery

## 2016-05-02 NOTE — H&P (Signed)
History of Present Illness Ralene Ok MD; 05/02/2016 10:19 AM) The patient is a 61 year old female who presents with an abdominal wall hernia. Patient is a 61 year old female who is referred by Wilfred Lacy, NP for evaluation of a ventral hernia. Patient states she is unsure of the origin of the hernia. However she does state that she has some pain on palpation. She also has pain with Valsalva. Patient underwent CT scan which revealed a hernia neck 3 cm. Patient states that she is had some on and off abdominal pain, is been no history of any constipation, incarceration, strangulation. All other reviews systems are negative.  Patient's had no previous surgical history. She does have hypertension, prediabetes.   Allergies Erline Levine, RN; 05/02/2016 10:06 AM) No Known Drug Allergies 05/02/2016  Medication History Erline Levine, RN; 05/02/2016 10:07 AM) AmLODIPine Besylate (10MG  Tablet, Oral) Active. Atenolol (25MG  Tablet, Oral) Active. Atorvastatin Calcium (10MG  Tablet, Oral) Active. Medications Reconciled  Vitals Erline Levine RN; 05/02/2016 10:06 AM) 05/02/2016 10:06 AM Weight: 336 lb Height: 71in Height was reported by patient. Body Surface Area: 2.63 m Body Mass Index: 46.86 kg/m  Temp.: 97.66F(Oral)  Pulse: 76 (Regular)  P.OX: 98% (Room air) BP: 140/80 (Sitting, Left Arm, Standard)       Physical Exam Ralene Ok MD; 05/02/2016 10:20 AM) General Mental Status-Alert. General Appearance-Consistent with stated age. Hydration-Well hydrated. Voice-Normal.  Head and Neck Head-normocephalic, atraumatic with no lesions or palpable masses. Trachea-midline. Thyroid Gland Characteristics - normal size and consistency.  Chest and Lung Exam Chest and lung exam reveals -quiet, even and easy respiratory effort with no use of accessory muscles and on auscultation, normal breath sounds, no adventitious sounds and normal vocal  resonance. Inspection Chest Wall - Normal. Back - normal.  Cardiovascular Cardiovascular examination reveals -normal heart sounds, regular rate and rhythm with no murmurs and normal pedal pulses bilaterally.  Abdomen Inspection Skin - Scar - no surgical scars. Hernias - Ventral - Incarcerated(Tender to palpation, contains omentum.). Palpation/Percussion Normal exam - Soft, Non Tender, No Rebound tenderness, No Rigidity (guarding) and No hepatosplenomegaly. Auscultation Normal exam - Bowel sounds normal.    Assessment & Plan Ralene Ok MD; 05/02/2016 10:20 AM) VENTRAL HERNIA WITHOUT OBSTRUCTION OR GANGRENE (K43.9) Impression: 61 year old female with incarcerated ventral hernia. 1. The patient elected to proceed to the operative for laparoscopic ventral hernia repair with mesh 2. All risks and benefits were discussed with the patient to generally include, but not limited to: infection, bleeding, damage to surrounding structures, acute and chronic nerve pain, and recurrence. Alternatives were offered and described. All questions were answered and the patient voiced understanding of the procedure and wishes to proceed at this point with hernia repair.

## 2016-06-28 ENCOUNTER — Encounter: Payer: Self-pay | Admitting: Nurse Practitioner

## 2016-06-28 ENCOUNTER — Ambulatory Visit (INDEPENDENT_AMBULATORY_CARE_PROVIDER_SITE_OTHER): Payer: BLUE CROSS/BLUE SHIELD | Admitting: Nurse Practitioner

## 2016-06-28 VITALS — BP 138/80 | HR 45 | Temp 98.2°F | Ht 71.0 in | Wt 339.0 lb

## 2016-06-28 DIAGNOSIS — I10 Essential (primary) hypertension: Secondary | ICD-10-CM | POA: Diagnosis not present

## 2016-06-28 DIAGNOSIS — E785 Hyperlipidemia, unspecified: Secondary | ICD-10-CM | POA: Diagnosis not present

## 2016-06-28 DIAGNOSIS — K436 Other and unspecified ventral hernia with obstruction, without gangrene: Secondary | ICD-10-CM

## 2016-06-28 DIAGNOSIS — R1033 Periumbilical pain: Secondary | ICD-10-CM | POA: Diagnosis not present

## 2016-06-28 DIAGNOSIS — R011 Cardiac murmur, unspecified: Secondary | ICD-10-CM | POA: Diagnosis not present

## 2016-06-28 DIAGNOSIS — E7800 Pure hypercholesterolemia, unspecified: Secondary | ICD-10-CM | POA: Insufficient documentation

## 2016-06-28 MED ORDER — ATORVASTATIN CALCIUM 10 MG PO TABS: 10.0000 mg | ORAL_TABLET | Freq: Every day | ORAL | 1 refills | Status: DC

## 2016-06-28 MED ORDER — AMLODIPINE BESYLATE 10 MG PO TABS: 10.0000 mg | ORAL_TABLET | Freq: Every day | ORAL | 1 refills | Status: DC

## 2016-06-28 MED ORDER — ATENOLOL 25 MG PO TABS: 12.5000 mg | ORAL_TABLET | ORAL | 1 refills | Status: DC

## 2016-06-28 NOTE — Patient Instructions (Signed)
Take atenolol half tab every other day x 2weeks, then half tab three times a week till next office visit.  You will be contacted to schedule appt for echocardiogram.  Encourage daily walking for exercise (at least 80mins) Encourage low sodium and low fat diet.   DASH Eating Plan DASH stands for "Dietary Approaches to Stop Hypertension." The DASH eating plan is a healthy eating plan that has been shown to reduce high blood pressure (hypertension). It may also reduce your risk for type 2 diabetes, heart disease, and stroke. The DASH eating plan may also help with weight loss. What are tips for following this plan? General guidelines  Avoid eating more than 2,300 mg (milligrams) of salt (sodium) a day. If you have hypertension, you may need to reduce your sodium intake to 1,500 mg a day.  Limit alcohol intake to no more than 1 drink a day for nonpregnant women and 2 drinks a day for men. One drink equals 12 oz of beer, 5 oz of wine, or 1 oz of hard liquor.  Work with your health care provider to maintain a healthy body weight or to lose weight. Ask what an ideal weight is for you.  Get at least 30 minutes of exercise that causes your heart to beat faster (aerobic exercise) most days of the week. Activities may include walking, swimming, or biking.  Work with your health care provider or diet and nutrition specialist (dietitian) to adjust your eating plan to your individual calorie needs. Reading food labels  Check food labels for the amount of sodium per serving. Choose foods with less than 5 percent of the Daily Value of sodium. Generally, foods with less than 300 mg of sodium per serving fit into this eating plan.  To find whole grains, look for the word "whole" as the first word in the ingredient list. Shopping  Buy products labeled as "low-sodium" or "no salt added."  Buy fresh foods. Avoid canned foods and premade or frozen meals. Cooking  Avoid adding salt when cooking. Use  salt-free seasonings or herbs instead of table salt or sea salt. Check with your health care provider or pharmacist before using salt substitutes.  Do not fry foods. Cook foods using healthy methods such as baking, boiling, grilling, and broiling instead.  Cook with heart-healthy oils, such as olive, canola, soybean, or sunflower oil. Meal planning   Eat a balanced diet that includes: ? 5 or more servings of fruits and vegetables each day. At each meal, try to fill half of your plate with fruits and vegetables. ? Up to 6-8 servings of whole grains each day. ? Less than 6 oz of lean meat, poultry, or fish each day. A 3-oz serving of meat is about the same size as a deck of cards. One egg equals 1 oz. ? 2 servings of low-fat dairy each day. ? A serving of nuts, seeds, or beans 5 times each week. ? Heart-healthy fats. Healthy fats called Omega-3 fatty acids are found in foods such as flaxseeds and coldwater fish, like sardines, salmon, and mackerel.  Limit how much you eat of the following: ? Canned or prepackaged foods. ? Food that is high in trans fat, such as fried foods. ? Food that is high in saturated fat, such as fatty meat. ? Sweets, desserts, sugary drinks, and other foods with added sugar. ? Full-fat dairy products.  Do not salt foods before eating.  Try to eat at least 2 vegetarian meals each week.  Eat more  home-cooked food and less restaurant, buffet, and fast food.  When eating at a restaurant, ask that your food be prepared with less salt or no salt, if possible. What foods are recommended? The items listed may not be a complete list. Talk with your dietitian about what dietary choices are best for you. Grains Whole-grain or whole-wheat bread. Whole-grain or whole-wheat pasta. Brown rice. [place]. Bulgur. Whole-grain and low-sodium cereals. Pita bread. Low-fat, low-sodium crackers. Whole-wheat flour tortillas. Vegetables Fresh or frozen vegetables (raw, steamed,  roasted, or grilled). Low-sodium or reduced-sodium tomato and vegetable juice. Low-sodium or reduced-sodium tomato sauce and tomato paste. Low-sodium or reduced-sodium canned vegetables. Fruits All fresh, dried, or frozen fruit. Canned fruit in natural juice (without added sugar). Meat and other protein foods Skinless chicken or [place]. Ground chicken or [place]. Pork with fat trimmed off. Fish and seafood. Egg whites. Dried beans, peas, or lentils. Unsalted nuts, nut butters, and seeds. Unsalted canned beans. Lean cuts of beef with fat trimmed off. Low-sodium, lean deli meat. Dairy Low-fat (1%) or fat-free (skim) milk. Fat-free, low-fat, or reduced-fat cheeses. Nonfat, low-sodium ricotta or cottage cheese. Low-fat or nonfat yogurt. Low-fat, low-sodium cheese. Fats and oils Soft margarine without trans fats. Vegetable oil. Low-fat, reduced-fat, or light mayonnaise and salad dressings (reduced-sodium). Canola, safflower, olive, soybean, and sunflower oils. Avocado. Seasoning and other foods Herbs. Spices. Seasoning mixes without salt. Unsalted popcorn and pretzels. Fat-free sweets. What foods are not recommended? The items listed may not be a complete list. Talk with your dietitian about what dietary choices are best for you. Grains Baked goods made with fat, such as croissants, muffins, or some breads. Dry pasta or rice meal packs. Vegetables Creamed or fried vegetables. Vegetables in a cheese sauce. Regular canned vegetables (not low-sodium or reduced-sodium). Regular canned tomato sauce and paste (not low-sodium or reduced-sodium). Regular tomato and vegetable juice (not low-sodium or reduced-sodium). Angie Fava. Olives. Fruits Canned fruit in a light or heavy syrup. Fried fruit. Fruit in cream or butter sauce. Meat and other protein foods Fatty cuts of meat. Ribs. Fried meat. Berniece Salines. Sausage. Bologna and other processed lunch meats. Salami. Fatback. Hotdogs. Bratwurst. Salted nuts and seeds. Canned  beans with added salt. Canned or smoked fish. Whole eggs or egg yolks. Chicken or [place] with skin. Dairy Whole or 2% milk, cream, and half-and-half. Whole or full-fat cream cheese. Whole-fat or sweetened yogurt. Full-fat cheese. Nondairy creamers. Whipped toppings. Processed cheese and cheese spreads. Fats and oils Butter. Stick margarine. Lard. Shortening. Ghee. Bacon fat. Tropical oils, such as coconut, palm kernel, or palm oil. Seasoning and other foods Salted popcorn and pretzels. Onion salt, garlic salt, seasoned salt, table salt, and sea salt. Worcestershire sauce. Tartar sauce. Barbecue sauce. Teriyaki sauce. Soy sauce, including reduced-sodium. Steak sauce. Canned and packaged gravies. Fish sauce. Oyster sauce. Cocktail sauce. Horseradish that you find on the shelf. Ketchup. Mustard. Meat flavorings and tenderizers. Bouillon cubes. Hot sauce and Tabasco sauce. Premade or packaged marinades. Premade or packaged taco seasonings. Relishes. Regular salad dressings. Where to find more information:  National Heart, Lung, and [place]: https://wilson-eaton.com/  American Heart Association: www.heart.org Summary  The DASH eating plan is a healthy eating plan that has been shown to reduce high blood pressure (hypertension). It may also reduce your risk for type 2 diabetes, heart disease, and stroke.  With the DASH eating plan, you should limit salt (sodium) intake to 2,300 mg a day. If you have hypertension, you may need to reduce your sodium intake to 1,500  mg a day.  When on the DASH eating plan, aim to eat more fresh fruits and vegetables, whole grains, lean proteins, low-fat dairy, and heart-healthy fats.  Work with your health care provider or diet and nutrition specialist (dietitian) to adjust your eating plan to your individual calorie needs. This information is not intended to replace advice given to you by your health care provider. Make sure you discuss any questions you have with your  health care provider. Document Released: 12/20/2010 Document Revised: 12/25/2015 Document Reviewed: 12/25/2015 Elsevier Interactive Patient Education  2017 [employer].

## 2016-06-28 NOTE — Assessment & Plan Note (Signed)
She was evaluated by [facility]. Hernia repair was recommended. Patient states she is unable to afford copay of $750 required.

## 2016-06-28 NOTE — Assessment & Plan Note (Signed)
Controlled with norvasc and atenolol. Due to marked sinus bradycardia, need to slowly wean off atenolol (half tab every other day x 2weeks, then half tab 3x/week till next OV). Consider adding losartan for BP control one atenolol is discontinued.

## 2016-06-28 NOTE — Assessment & Plan Note (Signed)
Ordered Echocardiogram today.

## 2016-06-28 NOTE — Progress Notes (Signed)
Subjective:  Patient ID: Rose Miller, female    DOB: [birth year]  Age: 61 y.o. MRN: [id number]  CC: Follow-up (3 mo fu)  HPI   HTN: Controlled with Norvasc and atenolol. HR persistently < 50 with intermittent exertional dyspnea. No low salt diet, no exercise.  Hyperlipidemia: Use of atorvastatin with no adverse effects.  Outpatient Medications Prior to Visit  Medication Sig Dispense Refill  . amLODipine (NORVASC) 10 MG tablet Take 1 tablet (10 mg total) by mouth daily. 90 tablet 1  . atenolol (TENORMIN) 25 MG tablet Take 0.5 tablets (12.5 mg total) by mouth daily. 60 tablet 1  . atorvastatin (LIPITOR) 10 MG tablet Take 1 tablet (10 mg total) by mouth daily. 30 tablet 3   No facility-administered medications prior to visit.     ROS Review of Systems  Constitutional: Negative.   Respiratory: Positive for shortness of breath. Negative for cough.   Cardiovascular: Negative for chest pain, palpitations, orthopnea, leg swelling and PND.  Gastrointestinal: Negative.  Negative for abdominal pain, constipation and nausea.  Genitourinary: Negative.   Musculoskeletal: Negative.   Skin: Negative.     Objective:  BP 138/80   Pulse (!) 45   Temp 98.2 F (36.8 C)   Ht 5\' 11"  (1.803 m)   Wt (!) 339 lb (153.8 kg)   SpO2 98%   BMI 47.28 kg/m   BP Readings from Last 3 Encounters:  06/28/16 138/80  03/29/16 (!) 146/100  12/19/14 140/80    Wt Readings from Last 3 Encounters:  06/28/16 (!) 339 lb (153.8 kg)  03/29/16 (!) 337 lb (152.9 kg)  12/19/14 (!) 345 lb (156.5 kg)    Physical Exam  Constitutional: She is oriented to person, place, and time.  Neck: Normal range of motion. Neck supple. No JVD present. No thyromegaly present.  Cardiovascular: Normal rate and regular rhythm.   Murmur heard. Pulmonary/Chest: Effort normal and breath sounds normal.  Musculoskeletal: She exhibits no edema or tenderness.  Lymphadenopathy:    She has no cervical adenopathy.    Neurological: She is alert and oriented to person, place, and time.  Skin: Skin is warm and dry.  Vitals reviewed.   Lab Results  Component Value Date   WBC 4.8 03/17/2014   HGB 12.6 03/17/2014   HCT 38.4 03/17/2014   PLT 261.0 03/17/2014   GLUCOSE 104 (H) 03/29/2016   CHOL 228 (H) 03/29/2016   TRIG 117.0 03/29/2016   HDL 54.50 03/29/2016   LDLCALC 150 (H) 03/29/2016   ALT 10 03/29/2016   AST 13 03/29/2016   NA 139 03/29/2016   K 3.9 03/29/2016   CL 103 03/29/2016   CREATININE 1.25 (H) 03/29/2016   BUN 14 03/29/2016   CO2 28 03/29/2016   TSH 1.17 03/29/2016   HGBA1C 6.3 03/29/2016    Ct Abdomen Wo Contrast  Result Date: 04/09/2016 CLINICAL DATA:  Periumbilical abdominal pain. History of ventral hernia. EXAM: CT ABDOMEN WITHOUT CONTRAST TECHNIQUE: Multidetector CT imaging of the abdomen was performed following the standard protocol without IV contrast. COMPARISON:  None. FINDINGS: Lower chest: Normal except for some calcification in the region of the mitral valve. Hepatobiliary: Liver parenchyma appears normal without contrast. Numerous calcified and partially calcified gallstones dependent within the gallbladder. No sign of ductal stone. Pancreas: Normal Spleen: Normal Adrenals/Urinary Tract: Adrenal glands are normal. Kidneys are normal. No cyst, mass, stone or hydronephrosis. Stomach/Bowel: No primary bowel pathology. Vascular/Lymphatic: Aortic atherosclerosis. No aneurysm. IVC is normal. No upper retroperitoneal adenopathy. Other: Ventral hernia  immediately superior to the umbilicus. Hernia defect measures 2.5-3 cm. Herniation of mesenteric fat measuring about 10 cm cephalo caudal with transverse diameter of 7 cm. No herniated intestine. No sign of pronounced edema. Musculoskeletal: Negative IMPRESSION: Ventral hernia just above the umbilicus. Hernia defect measures about 2.5-3 cm. Herniated fat measuring in total 10 x 7 x 7 cm. No herniated intestine. Chololithiasis.  Electronically Signed   By: Nelson Chimes M.D.   On: 04/09/2016 16:59    Assessment & Plan:   Rose Miller was seen today for follow-up.  Diagnoses and all orders for this visit:  Essential hypertension -     amLODipine (NORVASC) 10 MG tablet; Take 1 tablet (10 mg total) by mouth daily. -     atenolol (TENORMIN) 25 MG tablet; Take 0.5 tablets (12.5 mg total) by mouth every other day.  Heart murmur on physical examination -     ECHOCARDIOGRAM COMPLETE; Future  Hyperlipidemia LDL goal <70 -     atorvastatin (LIPITOR) 10 MG tablet; Take 1 tablet (10 mg total) by mouth daily.  Periumbilical pain  Ventral hernia with obstruction and without gangrene   I have changed Rose Miller's atenolol. I am also having her maintain her amLODipine and atorvastatin.  Meds ordered this encounter  Medications  . amLODipine (NORVASC) 10 MG tablet    Sig: Take 1 tablet (10 mg total) by mouth daily.    Dispense:  90 tablet    Refill:  1    Order Specific Question:   Supervising Provider    Answer:   Cassandria Anger [[id number]]  . atorvastatin (LIPITOR) 10 MG tablet    Sig: Take 1 tablet (10 mg total) by mouth daily.    Dispense:  90 tablet    Refill:  1    Order Specific Question:   Supervising Provider    Answer:   Cassandria Anger [[id number]]  . atenolol (TENORMIN) 25 MG tablet    Sig: Take 0.5 tablets (12.5 mg total) by mouth every other day.    Dispense:  60 tablet    Refill:  1    Order Specific Question:   Supervising Provider    Answer:   Cassandria Anger [[id number]]    Follow-up: Return in about 1 month (around 07/28/2016) for HTN (response to weaning off atenolol).  Wilfred Lacy, NP

## 2016-07-01 ENCOUNTER — Telehealth: Payer: Self-pay | Admitting: Nurse Practitioner

## 2016-07-01 NOTE — Telephone Encounter (Signed)
Called patient and LVM to call me back to schedule echo.  Could not leave a message on cell phone number.

## 2016-07-15 ENCOUNTER — Other Ambulatory Visit: Payer: Self-pay

## 2016-07-15 ENCOUNTER — Ambulatory Visit (HOSPITAL_COMMUNITY): Payer: BLUE CROSS/BLUE SHIELD | Attending: Internal Medicine

## 2016-07-15 DIAGNOSIS — I350 Nonrheumatic aortic (valve) stenosis: Secondary | ICD-10-CM

## 2016-07-15 DIAGNOSIS — I517 Cardiomegaly: Secondary | ICD-10-CM

## 2016-07-15 DIAGNOSIS — R0601 Orthopnea: Secondary | ICD-10-CM

## 2016-07-15 DIAGNOSIS — R011 Cardiac murmur, unspecified: Secondary | ICD-10-CM | POA: Diagnosis not present

## 2016-07-15 DIAGNOSIS — I352 Nonrheumatic aortic (valve) stenosis with insufficiency: Secondary | ICD-10-CM | POA: Insufficient documentation

## 2016-07-16 NOTE — Progress Notes (Unsigned)
Abnormal echocardiogram

## 2016-07-30 ENCOUNTER — Encounter: Payer: Self-pay | Admitting: Nurse Practitioner

## 2016-07-30 ENCOUNTER — Ambulatory Visit (INDEPENDENT_AMBULATORY_CARE_PROVIDER_SITE_OTHER): Payer: BLUE CROSS/BLUE SHIELD | Admitting: Nurse Practitioner

## 2016-07-30 VITALS — BP 164/80 | HR 51 | Temp 98.5°F | Ht 71.0 in | Wt 334.0 lb

## 2016-07-30 DIAGNOSIS — I350 Nonrheumatic aortic (valve) stenosis: Secondary | ICD-10-CM | POA: Diagnosis not present

## 2016-07-30 DIAGNOSIS — I119 Hypertensive heart disease without heart failure: Secondary | ICD-10-CM

## 2016-07-30 DIAGNOSIS — I10 Essential (primary) hypertension: Secondary | ICD-10-CM

## 2016-07-30 MED ORDER — SPHYGMOMANOMETER MISC: 1.0000 [IU] | Freq: Every day | 0 refills | Status: DC

## 2016-07-30 MED ORDER — LOSARTAN POTASSIUM 50 MG PO TABS: 50.0000 mg | ORAL_TABLET | Freq: Every day | ORAL | 3 refills | Status: DC

## 2016-07-30 MED ORDER — AMLODIPINE BESYLATE 10 MG PO TABS: 10.0000 mg | ORAL_TABLET | Freq: Every day | ORAL | 3 refills | Status: DC

## 2016-07-30 NOTE — Progress Notes (Signed)
Subjective:  Patient ID: Rose Miller, female    DOB: 1955-03-28  Age: 61 y.o. MRN: [id number]  CC: Follow-up (1 mo fu/test result consult)   HPI HTN:  Uncontrolled Does not check BP at home . Denies any adverse effects from decrease atenolol dose. Reports improved DOE Tries to maintain low salt diet. Does not exercise. She has not yet been contacted by cardiology. She will like to be scheduled with Dr. Burt Knack at [facility] cardiology.  Outpatient Medications Prior to Visit  Medication Sig Dispense Refill  . atorvastatin (LIPITOR) 10 MG tablet Take 1 tablet (10 mg total) by mouth daily. 90 tablet 1  . amLODipine (NORVASC) 10 MG tablet Take 1 tablet (10 mg total) by mouth daily. 90 tablet 1  . atenolol (TENORMIN) 25 MG tablet Take 0.5 tablets (12.5 mg total) by mouth every other day. 60 tablet 1   No facility-administered medications prior to visit.     ROS See HPI  Objective:  BP (!) 164/80   Pulse (!) 51   Temp 98.5 F (36.9 C)   Ht 5\' 11"  (1.803 m)   Wt (!) 334 lb (151.5 kg)   SpO2 98%   BMI 46.58 kg/m   BP Readings from Last 3 Encounters:  07/30/16 (!) 164/80  06/28/16 138/80  03/29/16 (!) 146/100    Wt Readings from Last 3 Encounters:  07/30/16 (!) 334 lb (151.5 kg)  06/28/16 (!) 339 lb (153.8 kg)  03/29/16 (!) 337 lb (152.9 kg)    Physical Exam  Constitutional: She is oriented to person, place, and time. No distress.  HENT:  Mouth/Throat: No oropharyngeal exudate.  Cardiovascular: Normal rate and regular rhythm.   Murmur heard. Pulmonary/Chest: Effort normal and breath sounds normal.  Musculoskeletal: Normal range of motion. She exhibits edema.  Neurological: She is alert and oriented to person, place, and time.  Skin: Skin is warm and dry.  Vitals reviewed.   Lab Results  Component Value Date   WBC 4.8 03/17/2014   HGB 12.6 03/17/2014   HCT 38.4 03/17/2014   PLT 261.0 03/17/2014   GLUCOSE 104 (H) 03/29/2016   CHOL 228 (H) 03/29/2016   TRIG 117.0 03/29/2016   HDL 54.50 03/29/2016   LDLCALC 150 (H) 03/29/2016   ALT 10 03/29/2016   AST 13 03/29/2016   NA 139 03/29/2016   K 3.9 03/29/2016   CL 103 03/29/2016   CREATININE 1.25 (H) 03/29/2016   BUN 14 03/29/2016   CO2 28 03/29/2016   TSH 1.17 03/29/2016   HGBA1C 6.3 03/29/2016    Ct Abdomen Wo Contrast  Result Date: 04/09/2016 CLINICAL DATA:  Periumbilical abdominal pain. History of ventral hernia. EXAM: CT ABDOMEN WITHOUT CONTRAST TECHNIQUE: Multidetector CT imaging of the abdomen was performed following the standard protocol without IV contrast. COMPARISON:  None. FINDINGS: Lower chest: Normal except for some calcification in the region of the mitral valve. Hepatobiliary: Liver parenchyma appears normal without contrast. Numerous calcified and partially calcified gallstones dependent within the gallbladder. No sign of ductal stone. Pancreas: Normal Spleen: Normal Adrenals/Urinary Tract: Adrenal glands are normal. Kidneys are normal. No cyst, mass, stone or hydronephrosis. Stomach/Bowel: No primary bowel pathology. Vascular/Lymphatic: Aortic atherosclerosis. No aneurysm. IVC is normal. No upper retroperitoneal adenopathy. Other: Ventral hernia immediately superior to the umbilicus. Hernia defect measures 2.5-3 cm. Herniation of mesenteric fat measuring about 10 cm cephalo caudal with transverse diameter of 7 cm. No herniated intestine. No sign of pronounced edema. Musculoskeletal: Negative IMPRESSION: Ventral hernia just above the umbilicus.  Hernia defect measures about 2.5-3 cm. Herniated fat measuring in total 10 x 7 x 7 cm. No herniated intestine. Chololithiasis. Electronically Signed   By: Nelson Chimes M.D.   On: 04/09/2016 16:59    Assessment & Plan:   Sarit was seen today for follow-up.  Diagnoses and all orders for this visit:  Essential hypertension -     losartan (COZAAR) 50 MG tablet; Take 1 tablet (50 mg total) by mouth daily. -     Ambulatory referral to  Cardiology -     amLODipine (NORVASC) 10 MG tablet; Take 1 tablet (10 mg total) by mouth daily. -     Blood Pressure Monitoring (SPHYGMOMANOMETER) MISC; 1 Units by Does not apply route daily.  Hypertensive left ventricular hypertrophy, without heart failure -     Ambulatory referral to Cardiology  Aortic stenosis, moderate -     Ambulatory referral to Cardiology   I have discontinued Ms. Paye's atenolol. I am also having her start on losartan and Sphygmomanometer. Additionally, I am having her maintain her atorvastatin and amLODipine.  Meds ordered this encounter  Medications  . losartan (COZAAR) 50 MG tablet    Sig: Take 1 tablet (50 mg total) by mouth daily.    Dispense:  90 tablet    Refill:  3    Order Specific Question:   Supervising Provider    Answer:   Cassandria Anger [[id number]]  . amLODipine (NORVASC) 10 MG tablet    Sig: Take 1 tablet (10 mg total) by mouth daily.    Dispense:  90 tablet    Refill:  3    Order Specific Question:   Supervising Provider    Answer:   Cassandria Anger [[id number]]  . Blood Pressure Monitoring (SPHYGMOMANOMETER) MISC    Sig: 1 Units by Does not apply route daily.    Dispense:  1 each    Refill:  0    Order Specific Question:   Supervising Provider    Answer:   Cassandria Anger [[id number]]    Follow-up: Return in about 3 months (around 10/30/2016).  Wilfred Lacy, NP

## 2016-07-30 NOTE — Patient Instructions (Signed)
Please heck BP once a day (morning) and record. Bring BP reading to next office visit.  Encourage regular exercise (walking at least 45mins) a day.  DASH Eating Plan DASH stands for "Dietary Approaches to Stop Hypertension." The DASH eating plan is a healthy eating plan that has been shown to reduce high blood pressure (hypertension). It may also reduce your risk for type 2 diabetes, heart disease, and stroke. The DASH eating plan may also help with weight loss. What are tips for following this plan? General guidelines  Avoid eating more than 2,300 mg (milligrams) of salt (sodium) a day. If you have hypertension, you may need to reduce your sodium intake to 1,500 mg a day.  Limit alcohol intake to no more than 1 drink a day for nonpregnant women and 2 drinks a day for men. One drink equals 12 oz of beer, 5 oz of wine, or 1 oz of hard liquor.  Work with your health care provider to maintain a healthy body weight or to lose weight. Ask what an ideal weight is for you.  Get at least 30 minutes of exercise that causes your heart to beat faster (aerobic exercise) most days of the week. Activities may include walking, swimming, or biking.  Work with your health care provider or diet and nutrition specialist (dietitian) to adjust your eating plan to your individual calorie needs. Reading food labels  Check food labels for the amount of sodium per serving. Choose foods with less than 5 percent of the Daily Value of sodium. Generally, foods with less than 300 mg of sodium per serving fit into this eating plan.  To find whole grains, look for the word "whole" as the first word in the ingredient list. Shopping  Buy products labeled as "low-sodium" or "no salt added."  Buy fresh foods. Avoid canned foods and premade or frozen meals. Cooking  Avoid adding salt when cooking. Use salt-free seasonings or herbs instead of table salt or sea salt. Check with your health care provider or pharmacist  before using salt substitutes.  Do not fry foods. Cook foods using healthy methods such as baking, boiling, grilling, and broiling instead.  Cook with heart-healthy oils, such as olive, canola, soybean, or sunflower oil. Meal planning   Eat a balanced diet that includes: ? 5 or more servings of fruits and vegetables each day. At each meal, try to fill half of your plate with fruits and vegetables. ? Up to 6-8 servings of whole grains each day. ? Less than 6 oz of lean meat, poultry, or fish each day. A 3-oz serving of meat is about the same size as a deck of cards. One egg equals 1 oz. ? 2 servings of low-fat dairy each day. ? A serving of nuts, seeds, or beans 5 times each week. ? Heart-healthy fats. Healthy fats called Omega-3 fatty acids are found in foods such as flaxseeds and coldwater fish, like sardines, salmon, and mackerel.  Limit how much you eat of the following: ? Canned or prepackaged foods. ? Food that is high in trans fat, such as fried foods. ? Food that is high in saturated fat, such as fatty meat. ? Sweets, desserts, sugary drinks, and other foods with added sugar. ? Full-fat dairy products.  Do not salt foods before eating.  Try to eat at least 2 vegetarian meals each week.  Eat more home-cooked food and less restaurant, buffet, and fast food.  When eating at a restaurant, ask that your food be prepared  with less salt or no salt, if possible. What foods are recommended? The items listed may not be a complete list. Talk with your dietitian about what dietary choices are best for you. Grains Whole-grain or whole-wheat bread. Whole-grain or whole-wheat pasta. Brown rice. [place]. Bulgur. Whole-grain and low-sodium cereals. Pita bread. Low-fat, low-sodium crackers. Whole-wheat flour tortillas. Vegetables Fresh or frozen vegetables (raw, steamed, roasted, or grilled). Low-sodium or reduced-sodium tomato and vegetable juice. Low-sodium or reduced-sodium tomato  sauce and tomato paste. Low-sodium or reduced-sodium canned vegetables. Fruits All fresh, dried, or frozen fruit. Canned fruit in natural juice (without added sugar). Meat and other protein foods Skinless chicken or [place]. Ground chicken or [place]. Pork with fat trimmed off. Fish and seafood. Egg whites. Dried beans, peas, or lentils. Unsalted nuts, nut butters, and seeds. Unsalted canned beans. Lean cuts of beef with fat trimmed off. Low-sodium, lean deli meat. Dairy Low-fat (1%) or fat-free (skim) milk. Fat-free, low-fat, or reduced-fat cheeses. Nonfat, low-sodium ricotta or cottage cheese. Low-fat or nonfat yogurt. Low-fat, low-sodium cheese. Fats and oils Soft margarine without trans fats. Vegetable oil. Low-fat, reduced-fat, or light mayonnaise and salad dressings (reduced-sodium). Canola, safflower, olive, soybean, and sunflower oils. Avocado. Seasoning and other foods Herbs. Spices. Seasoning mixes without salt. Unsalted popcorn and pretzels. Fat-free sweets. What foods are not recommended? The items listed may not be a complete list. Talk with your dietitian about what dietary choices are best for you. Grains Baked goods made with fat, such as croissants, muffins, or some breads. Dry pasta or rice meal packs. Vegetables Creamed or fried vegetables. Vegetables in a cheese sauce. Regular canned vegetables (not low-sodium or reduced-sodium). Regular canned tomato sauce and paste (not low-sodium or reduced-sodium). Regular tomato and vegetable juice (not low-sodium or reduced-sodium). Angie Fava. Olives. Fruits Canned fruit in a light or heavy syrup. Fried fruit. Fruit in cream or butter sauce. Meat and other protein foods Fatty cuts of meat. Ribs. Fried meat. Berniece Salines. Sausage. Bologna and other processed lunch meats. Salami. Fatback. Hotdogs. Bratwurst. Salted nuts and seeds. Canned beans with added salt. Canned or smoked fish. Whole eggs or egg yolks. Chicken or [place] with skin. Dairy Whole  or 2% milk, cream, and half-and-half. Whole or full-fat cream cheese. Whole-fat or sweetened yogurt. Full-fat cheese. Nondairy creamers. Whipped toppings. Processed cheese and cheese spreads. Fats and oils Butter. Stick margarine. Lard. Shortening. Ghee. Bacon fat. Tropical oils, such as coconut, palm kernel, or palm oil. Seasoning and other foods Salted popcorn and pretzels. Onion salt, garlic salt, seasoned salt, table salt, and sea salt. Worcestershire sauce. Tartar sauce. Barbecue sauce. Teriyaki sauce. Soy sauce, including reduced-sodium. Steak sauce. Canned and packaged gravies. Fish sauce. Oyster sauce. Cocktail sauce. Horseradish that you find on the shelf. Ketchup. Mustard. Meat flavorings and tenderizers. Bouillon cubes. Hot sauce and Tabasco sauce. Premade or packaged marinades. Premade or packaged taco seasonings. Relishes. Regular salad dressings. Where to find more information:  National Heart, Lung, and [place]: https://wilson-eaton.com/  American Heart Association: www.heart.org Summary  The DASH eating plan is a healthy eating plan that has been shown to reduce high blood pressure (hypertension). It may also reduce your risk for type 2 diabetes, heart disease, and stroke.  With the DASH eating plan, you should limit salt (sodium) intake to 2,300 mg a day. If you have hypertension, you may need to reduce your sodium intake to 1,500 mg a day.  When on the DASH eating plan, aim to eat more fresh fruits and vegetables, whole grains, lean  proteins, low-fat dairy, and heart-healthy fats.  Work with your health care provider or diet and nutrition specialist (dietitian) to adjust your eating plan to your individual calorie needs. This information is not intended to replace advice given to you by your health care provider. Make sure you discuss any questions you have with your health care provider. Document Released: 12/20/2010 Document Revised: 12/25/2015 Document Reviewed:  12/25/2015 Elsevier Interactive Patient Education  2017 [employer].

## 2016-08-19 ENCOUNTER — Encounter: Payer: Self-pay | Admitting: Interventional Cardiology

## 2016-09-01 NOTE — Progress Notes (Signed)
Cardiology Office Note    Date:  09/03/2016   ID:  Rose Miller, DOB 04/06/1955, MRN [id number]  PCP:  Flossie Buffy, NP  Cardiologist: Sinclair Grooms, MD   Chief Complaint  Patient presents with  . Cardiac Valve Problem  . Advice Only    Hypertension/LVH    History of Present Illness:  Rose Miller is a 61 y.o. female referred by Dr.Charlotte Lum [name] for evaluation of aortic stenosis and management of left ventricular hypertrophy.  The patient has long-standing history of heart murmur. There is also a long-standing history of hypertension that has not always been under good control. Hypertension has existed for greater than 20 years. She denies orthopnea, PND, and chest tightness. She has never had syncope. She denies palpitations. There is no peripheral edema.  Past Medical History:  Diagnosis Date  . Allergy   . Arthritis   . Asthma   . Heart murmur   . Hypertension     Past Surgical History:  Procedure Laterality Date  . CESAREAN SECTION  1989  . TONSILLECTOMY AND ADENOIDECTOMY  1960    Current Medications: Outpatient Medications Prior to Visit  Medication Sig Dispense Refill  . amLODipine (NORVASC) 10 MG tablet Take 1 tablet (10 mg total) by mouth daily. 90 tablet 3  . atorvastatin (LIPITOR) 10 MG tablet Take 1 tablet (10 mg total) by mouth daily. 90 tablet 1  . Blood Pressure Monitoring (SPHYGMOMANOMETER) MISC 1 Units by Does not apply route daily. 1 each 0  . losartan (COZAAR) 50 MG tablet Take 1 tablet (50 mg total) by mouth daily. 90 tablet 3   No facility-administered medications prior to visit.      Allergies:   Patient has no known allergies.   Social History   Social History  . Marital status: Single    Spouse name: N/A  . Number of children: N/A  . Years of education: N/A   Social History Main Topics  . Smoking status: Former Research scientist (life sciences)  . Smokeless tobacco: Never Used  . Alcohol use No  . Drug use: No  . Sexual activity: Not  Asked   Other Topics Concern  . None   Social History Narrative  . None     Family History:  The patient's family history includes Arthritis in her mother; Cancer in her father and mother; Colon cancer in her father; Hypertension in her mother. Brother has a history of rheumatic heart disease and is status post mitral valve replacement.  ROS:   Please see the history of present illness.    Lower back discomfort, history of lumbar disc disease, history of snoring.  All other systems reviewed and are negative.   PHYSICAL EXAM:   VS:  BP (!) 156/98 (BP Location: Right Arm)   Pulse 87   Ht 5\' 11"  (1.803 m)   Wt (!) 339 lb 6.4 oz (154 kg)   BMI 47.34 kg/m    GEN: Well nourished, well developed, in no acute distress  HEENT: normal  Neck: no JVD, carotid bruits, or masses Cardiac: RRR; 3/6Crescendo decrescendo systolic murmur consistent with aortic stenosis. No rub is heard. There is an S4 gallop but no edema . Respiratory:  clear to auscultation bilaterally, normal work of breathing GI: soft, nontender, nondistended, + BS MS: no deformity or atrophy  Skin: warm and dry, no rash Neuro:  Alert and Oriented x 3, Strength and sensation are intact Psych: euthymic mood, full affect  Wt Readings from Last 3  Encounters:  09/03/16 (!) 339 lb 6.4 oz (154 kg)  07/30/16 (!) 334 lb (151.5 kg)  06/28/16 (!) 339 lb (153.8 kg)      Studies/Labs Reviewed:   EKG:  EKG  Sinus bradycardia, QRS widening, incomplete left bundle branch block, LVH.  Recent Labs: 03/29/2016: ALT 10; BUN 14; Creatinine, Ser 1.25; Potassium 3.9; Sodium 139; TSH 1.17   Lipid Panel    Component Value Date/Time   CHOL 228 (H) 03/29/2016 1537   TRIG 117.0 03/29/2016 1537   HDL 54.50 03/29/2016 1537   CHOLHDL 4 03/29/2016 1537   VLDL 23.4 03/29/2016 1537   LDLCALC 150 (H) 03/29/2016 1537    Additional studies/ records that were reviewed today include:  Echocardiography 07/15/2016:  Study Conclusions  - Left  ventricle: The cavity size was normal. Wall thickness was   increased in a pattern of severe LVH. Systolic function was   vigorous. The estimated ejection fraction was in the range of 65%   to 70%. Doppler parameters are consistent with restrictive left   ventricular relaxation (grade 3 diastolic dysfunction). The E/e&'   ratio is >20, suggesting markedly elevated LV filling pressure. - Aortic valve: Mildly calcified leaflets. Moderate stenosis. There   was mild regurgitation. Mean gradient (S): 24 mm Hg. Peak   gradient (S): 48 mm Hg. Valve area (VTI): 0.93 cm^2. Valve area   (Vmax): 0.89 cm^2. Valve area (Vmean): 0.82 cm^2. - Left atrium: The atrium was normal in size. - Right ventricle: The cavity size was normal. Systolic function   was low normal. - Right atrium: The atrium was normal in size.  Impressions:  - LVEF 65-70%, severe LVH (IVSd >2 cm) - consider HCM, normal wall   motion, grade 3 DD with elevated LV filling pressures, moderate   aortic valve stenosis, normal biatrial size.   ASSESSMENT:    1. Aortic stenosis, moderate   2. Essential hypertension   3. Severe concentric left ventricular hypertrophy   4. Morbid obesity ([place])   5. Hyperlipidemia LDL goal <70      PLAN:  In order of problems listed above:  1. Moderate in severity based upon the above-noted echo. Clinical/cardiac auscultation is consistent. Repeat echo in one year. Natural history and symptoms associated with progression of aortic stenosis were discussed in detail. 2. She needs better blood pressure control. Will add hydrochlorothiazide 12.5 milligrams per day. He eventually converted to Hyzaar 50/12.5 mg. Aim for systolic blood pressure under 267 and diastolic under 90. 3. Better blood pressure control may help improve LVH although progression of aortic stenosis will counterbalance. This was discussed with the patient. 4. Weight loss should be considered.    Medication Adjustments/Labs and  Tests Ordered: Current medicines are reviewed at length with the patient today.  Concerns regarding medicines are outlined above.  Medication changes, Labs and Tests ordered today are listed in the Patient Instructions below. Patient Instructions  Medication Instructions:  1) START Hydrochlorothiazide 12.5mg  once daily.  Take this along with your Losartan.  Once your supply of Losartan gets low, please contact the office so we can send in a prescription for Losartan/HCTZ 50/12.5mg  once daily.  Labwork: Your physician recommends that you return for lab work in: 2 weeks with Dr. [name].   Testing/Procedures: None  Follow-Up: Your physician recommends that you schedule a follow-up appointment in: 1 month with our Hypertension Clinic.  Your physician wants you to follow-up in: 1 year with Dr. [name].  You will receive a reminder letter in the  mail two months in advance. If you don't receive a letter, please call our office to schedule the follow-up appointment.    Any Other Special Instructions Will Be Listed Below (If Applicable).     If you need a refill on your cardiac medications before your next appointment, please call your pharmacy.      Signed, Sinclair Grooms, MD  09/03/2016 3:15 PM    [facility] [address], [place], [place]  [address] Phone: [phone]; Fax: [phone]

## 2016-09-03 ENCOUNTER — Ambulatory Visit (INDEPENDENT_AMBULATORY_CARE_PROVIDER_SITE_OTHER): Payer: BLUE CROSS/BLUE SHIELD | Admitting: Interventional Cardiology

## 2016-09-03 ENCOUNTER — Encounter: Payer: Self-pay | Admitting: Interventional Cardiology

## 2016-09-03 VITALS — BP 156/98 | HR 87 | Ht 71.0 in | Wt 339.4 lb

## 2016-09-03 DIAGNOSIS — E785 Hyperlipidemia, unspecified: Secondary | ICD-10-CM | POA: Diagnosis not present

## 2016-09-03 DIAGNOSIS — I517 Cardiomegaly: Secondary | ICD-10-CM

## 2016-09-03 DIAGNOSIS — I10 Essential (primary) hypertension: Secondary | ICD-10-CM

## 2016-09-03 DIAGNOSIS — I350 Nonrheumatic aortic (valve) stenosis: Secondary | ICD-10-CM | POA: Diagnosis not present

## 2016-09-03 MED ORDER — HYDROCHLOROTHIAZIDE 25 MG PO TABS: 12.5000 mg | ORAL_TABLET | Freq: Every day | ORAL | 0 refills | Status: DC

## 2016-09-03 NOTE — Patient Instructions (Signed)
Medication Instructions:  1) START Hydrochlorothiazide 12.5mg  once daily.  Take this along with your Losartan.  Once your supply of Losartan gets low, please contact the office so we can send in a prescription for Losartan/HCTZ 50/12.5mg  once daily.  Labwork: Your physician recommends that you return for lab work in: 2 weeks with Dr. [name].   Testing/Procedures: None  Follow-Up: Your physician recommends that you schedule a follow-up appointment in: 1 month with our Hypertension Clinic.  Your physician wants you to follow-up in: 1 year with Dr. [name].  You will receive a reminder letter in the mail two months in advance. If you don't receive a letter, please call our office to schedule the follow-up appointment.    Any Other Special Instructions Will Be Listed Below (If Applicable).     If you need a refill on your cardiac medications before your next appointment, please call your pharmacy.

## 2016-09-17 ENCOUNTER — Other Ambulatory Visit: Payer: BLUE CROSS/BLUE SHIELD | Admitting: *Deleted

## 2016-09-17 DIAGNOSIS — I10 Essential (primary) hypertension: Secondary | ICD-10-CM

## 2016-09-17 LAB — BASIC METABOLIC PANEL
BUN/Creatinine Ratio: 19 (ref 12–28)
BUN: 23 mg/dL (ref 8–27)
CO2: 25 mmol/L (ref 20–29)
CREATININE: 1.23 mg/dL — AB (ref 0.57–1.00)
Calcium: 9.6 mg/dL (ref 8.7–10.3)
Chloride: 100 mmol/L (ref 96–106)
GFR calc Af Amer: 54 mL/min/{1.73_m2} — ABNORMAL LOW (ref >59)
GFR calc non Af Amer: 47 mL/min/{1.73_m2} — ABNORMAL LOW (ref >59)
GLUCOSE: 146 mg/dL — AB (ref 65–99)
POTASSIUM: 3.7 mmol/L (ref 3.5–5.2)
SODIUM: 142 mmol/L (ref 134–144)

## 2016-09-19 ENCOUNTER — Telehealth: Payer: Self-pay | Admitting: Interventional Cardiology

## 2016-09-19 NOTE — Telephone Encounter (Signed)
New Message ° °Pt call stating she was returning RN call. Please call back to discuss °

## 2016-09-19 NOTE — Telephone Encounter (Signed)
Spoke with pt and she states that she had a missed call from our office.  Advised I am not sure who called and was unable to locate who it might be.  Did go over her lab results with her.  Pt appreciative for call back.

## 2016-10-02 NOTE — Progress Notes (Signed)
Patient ID: Rose Miller                 DOB: May 14, 1955                      MRN: [id number]     HPI: Rose Miller is a 61 y.o. female referred by Dr. [name] to HTN clinic. PMH is significant for HTN, HLD, and obesity. At [place] on 06/28/16 pt's BP was controlled on amlodipine 10mg  daily and atenolol 25mg  daily. Pt's HR was persistently <50bpm with intermittent exertional dyspnea.  Atenolol was tapered and discontinued and losartan 50mg  daily initiated. At most recent OV on 09/03/16, BP was elevated at 156/98 and HCTZ 12.5mg  daily was added to BP regimen. F/u BMET stable.   Pt presents today for f/u accompanied by her sister-in-law. Pt states she is tolerating the HCTZ well with no complaints.  She states she feels much better on this regimen.  She denies any dizziness, lightheadedness, blurry vision, or headaches. At home her BP has been ranging from 130s-150s/70s-80s using her home wrist cuff.  Current HTN meds: amlodipine 10mg  daily, HCTZ 12.5mg  daily, losartan 50mg  daily Previously tried: atenolol 25mg  (persistently <50bpm with intermittent exertional dyspnea) BP goal: <130/40mmHg  Family History: The patient's family history includes Arthritis in her mother; Cancer in her father and mother; Colon cancer in her father; Hypertension in her mother. Brother has a history of rheumatic heart disease and is status post mitral valve replacement.  Social History: Former smoker, denies alcohol and drug use.   Diet: Tries to maintain low salt diet. Provided information on the DASH diet 07/30/16.    Exercise: Does not exercise. Encouraged patient to start walking. Goal of 30 minutes 2 times a week.   Home BP readings: Forgot to bring in BP log.  Last reading she remembers was 130s/80s.   Wt Readings from Last 3 Encounters:  09/03/16 (!) 339 lb 6.4 oz (154 kg)  07/30/16 (!) 334 lb (151.5 kg)  06/28/16 (!) 339 lb (153.8 kg)   BP Readings from Last 3 Encounters:  09/03/16 (!) 156/98  07/30/16 (!)  164/80  06/28/16 138/80   Pulse Readings from Last 3 Encounters:  09/03/16 87  07/30/16 (!) 51  06/28/16 (!) 45    Renal function: CrCl cannot be calculated (Unknown ideal weight.).  Past Medical History:  Diagnosis Date  . Allergy   . Arthritis   . Asthma   . Heart murmur   . Hypertension     Current Outpatient Prescriptions on File Prior to Visit  Medication Sig Dispense Refill  . amLODipine (NORVASC) 10 MG tablet Take 1 tablet (10 mg total) by mouth daily. 90 tablet 3  . atorvastatin (LIPITOR) 10 MG tablet Take 1 tablet (10 mg total) by mouth daily. 90 tablet 1  . Blood Pressure Monitoring (SPHYGMOMANOMETER) MISC 1 Units by Does not apply route daily. 1 each 0  . hydrochlorothiazide (HYDRODIURIL) 25 MG tablet Take 0.5 tablets (12.5 mg total) by mouth daily. 30 tablet 0  . losartan (COZAAR) 50 MG tablet Take 1 tablet (50 mg total) by mouth daily. 90 tablet 3   No current facility-administered medications on file prior to visit.     No Known Allergies   Assessment/Plan:  1. Hypertension: Pt's BP today was above goal of <130/43mmHg although improved since starting HCTZ. Will increase losartan to 100mg  daily and continue amlodipine 10mg  daily and HCTZ 12.5mg  daily. Pt encouraged to start walking for exercise, continue low-salt  diet, and to continue monitoring BP at home. Will follow up with pt in HTN clinic in 3 weeks and recheck BMET.   Pt seen with pharmacy student: Maple Mirza.  Mekisha Bittel E. Kalayah Leske, PharmD, CPP, [place] [address] [place], [place] [address] Phone: [phone]; Fax: [phone] 10/03/2016 3:47 PM

## 2016-10-03 ENCOUNTER — Ambulatory Visit: Payer: BLUE CROSS/BLUE SHIELD

## 2016-10-03 ENCOUNTER — Ambulatory Visit (INDEPENDENT_AMBULATORY_CARE_PROVIDER_SITE_OTHER): Payer: BLUE CROSS/BLUE SHIELD | Admitting: Pharmacist

## 2016-10-03 VITALS — BP 136/84 | HR 78

## 2016-10-03 DIAGNOSIS — I10 Essential (primary) hypertension: Secondary | ICD-10-CM | POA: Diagnosis not present

## 2016-10-03 MED ORDER — LOSARTAN POTASSIUM 100 MG PO TABS: 100.0000 mg | ORAL_TABLET | Freq: Every day | ORAL | 3 refills | Status: DC

## 2016-10-03 NOTE — Patient Instructions (Addendum)
Thank you for coming to see Rose Miller today!   Your blood pressure was above your goal of <130/80.  Your blood pressure today was 136/84.   Continue taking your amlodipine 10mg  daily, hydrochlorothiazide 12.5mg  daily, and increase your losartan to 100mg  daily (you may take 2 of your 50mg  tablets.  We have sent in a new prescription to [facility] for the 100mg  tablets once you run out of your current prescription).   Follow up in clinic for a blood pressure check in 3 weeks.   Please continue taking your blood pressure at home and bring in your wrist monitor to your next visit.   Try to walk 30 minutes twice a week.   Have a great day!

## 2016-10-04 ENCOUNTER — Other Ambulatory Visit: Payer: Self-pay | Admitting: Nurse Practitioner

## 2016-10-04 DIAGNOSIS — Z1231 Encounter for screening mammogram for malignant neoplasm of breast: Secondary | ICD-10-CM

## 2016-10-14 ENCOUNTER — Other Ambulatory Visit: Payer: Self-pay | Admitting: *Deleted

## 2016-10-14 ENCOUNTER — Telehealth: Payer: Self-pay | Admitting: *Deleted

## 2016-10-14 MED ORDER — LOSARTAN POTASSIUM-HCTZ 100-12.5 MG PO TABS: 1.0000 | ORAL_TABLET | Freq: Every day | ORAL | 3 refills | Status: DC

## 2016-10-14 NOTE — Telephone Encounter (Signed)
Ok to switch patient to hyzaar 100/12.5 mg dose

## 2016-10-14 NOTE — Telephone Encounter (Addendum)
Dr. [name] had planned to change pt to Hyzaar if BP ok.  I see pt's Losartan was increased to 100mg  QD by Pharmacist.  Will route to Pharmacist to see if she feels ok to go ahead and change to Hyzaar?

## 2016-10-14 NOTE — Telephone Encounter (Signed)
Rx has been sent in and med list updated.

## 2016-10-14 NOTE — Telephone Encounter (Signed)
Patient called and requested an rx for the combination losartan/hctz. Okay to order this for the patient? Please advise. Thanks, MI

## 2016-10-21 NOTE — Progress Notes (Addendum)
Patient ID: Rose Miller                 DOB: 07/09/1955                      MRN: [id number]     HPI: Rose Miller is a 61 y.o. female referred by Dr. [name] to HTN clinic. PMH is significant for HTN, HLD, and obesity. Atenolol was previously tapered due to bradycardia. At most recent HTN visit on 9/20, BP was improved to 136/84 but remained elevated and losartan was increased to 100mg  daily. Pt presents today for BP follow up and BMET.  Pt presents today for f/u accompanied by her sister-in-law. She reports feeling well overall and denies dizziness or falls. Occasionally has a headache and trouble sleeping. She has been checking her BP every day and readings have ranged 120-140s/70s using her home wrist cuff. Her home readings have been toward the higher end of this range during the past week - she has been more stressed out recently. She recently picked up her prescription for losartan-HCTZ 100-12.5mg  daily to help decrease pill burden.   Current HTN meds: amlodipine 10mg  daily, losartan-HCTZ 100-12.5mg  daily Previously tried: atenolol 25mg  (persistently <50bpm with intermittent exertional dyspnea) BP goal: <130/73mmHg  Family History: The patient's family history includes Arthritis in her mother; Cancer in her father and mother; Colon cancer in her father; Hypertension in her mother.Brother has a history of rheumatic heart disease and is status post mitral valve replacement.  Social History: Former smoker, denies alcohol and drug use.   Diet: Tries to maintain low salt diet. Provided information on the DASH diet 07/30/16.    Exercise: Does not exercise. Encouraged patient to start walking. Goal of 30 minutes 2 times a week.   Wt Readings from Last 3 Encounters:  09/03/16 (!) 339 lb 6.4 oz (154 kg)  07/30/16 (!) 334 lb (151.5 kg)  06/28/16 (!) 339 lb (153.8 kg)   BP Readings from Last 3 Encounters:  10/03/16 136/84  09/03/16 (!) 156/98  07/30/16 (!) 164/80   Pulse Readings  from Last 3 Encounters:  10/03/16 78  09/03/16 87  07/30/16 (!) 51    Renal function: CrCl cannot be calculated (Patient's most recent lab result is older than the maximum 21 days allowed.).  Past Medical History:  Diagnosis Date  . Allergy   . Arthritis   . Asthma   . Heart murmur   . Hypertension     Current Outpatient Prescriptions on File Prior to Visit  Medication Sig Dispense Refill  . amLODipine (NORVASC) 10 MG tablet Take 1 tablet (10 mg total) by mouth daily. 90 tablet 3  . atorvastatin (LIPITOR) 10 MG tablet Take 1 tablet (10 mg total) by mouth daily. 90 tablet 1  . Blood Pressure Monitoring (SPHYGMOMANOMETER) MISC 1 Units by Does not apply route daily. 1 each 0  . losartan-hydrochlorothiazide (HYZAAR) 100-12.5 MG tablet Take 1 tablet by mouth daily. 90 tablet 3   No current facility-administered medications on file prior to visit.     No Known Allergies   Assessment/Plan:  1. Hypertension - BP remains elevated above goal < 130/88mmHg although home readings have improved since increasing losartan. Will check BMET today and if stable, plan to increase HCTZ to 25mg  daily. Continue losartan 100mg  daily and amlodipine 10mg  daily. Will send in separate rx for HCTZ to supplement pt's losartan-HCTZ since she recently picked up a 90 day supply of the 100-12.5mg  strength. Will send  note to PCP to recheck BMET at pt's follow up in 1 week. F/u in HTN clinic in 5 weeks.   Neela Zecca E. Azure Barrales, PharmD, CPP, [facility] [address], [place], [place] [address] Phone: [phone]; Fax: [phone] 10/22/2016 1:59 PM  Addendum: BMET is stable - will increase HCTZ to total of 25mg  daily. Pt will take extra 1/2 of her 25mg  tablet in addition to her losartan-HCTZ 100-12.5mg  tablet. Pt is aware of plan and her PCP will check f/u BMET in 1 week.

## 2016-10-22 ENCOUNTER — Ambulatory Visit (INDEPENDENT_AMBULATORY_CARE_PROVIDER_SITE_OTHER): Payer: BLUE CROSS/BLUE SHIELD | Admitting: Pharmacist

## 2016-10-22 ENCOUNTER — Other Ambulatory Visit (INDEPENDENT_AMBULATORY_CARE_PROVIDER_SITE_OTHER): Payer: BLUE CROSS/BLUE SHIELD

## 2016-10-22 VITALS — BP 144/86 | HR 71

## 2016-10-22 DIAGNOSIS — I10 Essential (primary) hypertension: Secondary | ICD-10-CM

## 2016-10-22 NOTE — Patient Instructions (Addendum)
It was nice to see you today  If your lab work is stable today, we will increase your hydrochlorothiazide (HCTZ) to 25mg  daily. Since you are taking losartan-HCTZ 100-12.5mg  daily, you can take an extra 1/2 tablet of your HCTZ 25mg  that you have at home. I can send in a new prescription to last until you run out of your combination pill. Then, I will send in a higher dose of your combination medicine.  Start walking twice a week for 30 minutes  Follow up with your primary care doctor in 1 week - I will ask them to check labs  Follow up for blood pressure check in 5 weeks

## 2016-10-23 ENCOUNTER — Ambulatory Visit
Admission: RE | Admit: 2016-10-23 | Discharge: 2016-10-23 | Disposition: A | Discharge status: Home / Self Care | Payer: BLUE CROSS/BLUE SHIELD | Source: Ambulatory Visit | Attending: Nurse Practitioner | Admitting: Nurse Practitioner

## 2016-10-23 DIAGNOSIS — Z1231 Encounter for screening mammogram for malignant neoplasm of breast: Secondary | ICD-10-CM

## 2016-10-23 LAB — BASIC METABOLIC PANEL
BUN / CREAT RATIO: 15 (ref 12–28)
BUN: 15 mg/dL (ref 8–27)
CO2: 25 mmol/L (ref 20–29)
CREATININE: 1.01 mg/dL — AB (ref 0.57–1.00)
Calcium: 9.3 mg/dL (ref 8.7–10.3)
Chloride: 101 mmol/L (ref 96–106)
GFR calc Af Amer: 69 mL/min/{1.73_m2} (ref >59)
GFR calc non Af Amer: 60 mL/min/{1.73_m2} (ref >59)
GLUCOSE: 107 mg/dL — AB (ref 65–99)
POTASSIUM: 4 mmol/L (ref 3.5–5.2)
SODIUM: 141 mmol/L (ref 134–144)

## 2016-10-23 MED ORDER — HYDROCHLOROTHIAZIDE 12.5 MG PO TABS: 12.5000 mg | ORAL_TABLET | Freq: Every day | ORAL | 2 refills | Status: DC

## 2016-10-23 NOTE — Addendum Note (Signed)
Addended by: SUPPLE, MEGAN E on: 10/23/2016 12:34 PM   Modules accepted: Orders

## 2016-10-24 ENCOUNTER — Other Ambulatory Visit: Payer: Self-pay | Admitting: Nurse Practitioner

## 2016-10-24 DIAGNOSIS — R928 Other abnormal and inconclusive findings on diagnostic imaging of breast: Secondary | ICD-10-CM

## 2016-10-30 ENCOUNTER — Ambulatory Visit
Admission: RE | Admit: 2016-10-30 | Discharge: 2016-10-30 | Disposition: A | Discharge status: Home / Self Care | Payer: BLUE CROSS/BLUE SHIELD | Source: Ambulatory Visit | Attending: Nurse Practitioner | Admitting: Nurse Practitioner

## 2016-10-30 ENCOUNTER — Other Ambulatory Visit: Payer: Self-pay | Admitting: Nurse Practitioner

## 2016-10-30 DIAGNOSIS — R928 Other abnormal and inconclusive findings on diagnostic imaging of breast: Secondary | ICD-10-CM

## 2016-10-30 DIAGNOSIS — N63 Unspecified lump in unspecified breast: Secondary | ICD-10-CM

## 2016-10-31 ENCOUNTER — Other Ambulatory Visit (INDEPENDENT_AMBULATORY_CARE_PROVIDER_SITE_OTHER): Payer: BLUE CROSS/BLUE SHIELD

## 2016-10-31 ENCOUNTER — Encounter: Payer: Self-pay | Admitting: Nurse Practitioner

## 2016-10-31 ENCOUNTER — Ambulatory Visit (INDEPENDENT_AMBULATORY_CARE_PROVIDER_SITE_OTHER): Payer: BLUE CROSS/BLUE SHIELD | Admitting: Nurse Practitioner

## 2016-10-31 VITALS — BP 130/78 | HR 53 | Temp 98.1°F | Ht 71.0 in | Wt 339.0 lb

## 2016-10-31 DIAGNOSIS — I10 Essential (primary) hypertension: Secondary | ICD-10-CM | POA: Diagnosis not present

## 2016-10-31 DIAGNOSIS — E785 Hyperlipidemia, unspecified: Secondary | ICD-10-CM | POA: Diagnosis not present

## 2016-10-31 LAB — HEPATIC FUNCTION PANEL
ALBUMIN: 4.2 g/dL (ref 3.5–5.2)
ALK PHOS: 72 U/L (ref 39–117)
ALT: 16 U/L (ref 0–35)
AST: 15 U/L (ref 0–37)
Bilirubin, Direct: 0.1 mg/dL (ref 0.0–0.3)
Total Bilirubin: 0.5 mg/dL (ref 0.2–1.2)
Total Protein: 8 g/dL (ref 6.0–8.3)

## 2016-10-31 LAB — CK: CK TOTAL: 181 U/L — AB (ref 7–177)

## 2016-10-31 LAB — BASIC METABOLIC PANEL
BUN: 13 mg/dL (ref 6–23)
CALCIUM: 9.8 mg/dL (ref 8.4–10.5)
CO2: 33 mEq/L — ABNORMAL HIGH (ref 19–32)
Chloride: 98 mEq/L (ref 96–112)
Creatinine, Ser: 1.56 mg/dL — ABNORMAL HIGH (ref 0.40–1.20)
GFR: 43.13 mL/min — AB (ref >60.00)
GLUCOSE: 117 mg/dL — AB (ref 70–99)
Potassium: 3.8 mEq/L (ref 3.5–5.1)
SODIUM: 138 meq/L (ref 135–145)

## 2016-10-31 MED ORDER — ATORVASTATIN CALCIUM 10 MG PO TABS: 10.0000 mg | ORAL_TABLET | Freq: Every day | ORAL | 1 refills | Status: DC

## 2016-10-31 NOTE — Progress Notes (Signed)
Subjective:  Patient ID: Rose Miller, female    DOB: Jun 11, 1955  Age: 61 y.o. MRN: [id number]  CC: Follow-up (3 mo fu--BP is up and down--mammogram result consult. )   HPI  HTN: Improving. Needs BMP rechecked. HCTZ increased by cardiology. Home BP readings 130s-150s/70s-80s BP Readings from Last 3 Encounters:  10/31/16 130/78  10/22/16 (!) 144/86  10/03/16 136/84   Outpatient Medications Prior to Visit  Medication Sig Dispense Refill  . amLODipine (NORVASC) 10 MG tablet Take 1 tablet (10 mg total) by mouth daily. 90 tablet 3  . Blood Pressure Monitoring (SPHYGMOMANOMETER) MISC 1 Units by Does not apply route daily. 1 each 0  . Cholecalciferol (EQL VITAMIN D3) 1000 units tablet Take 1,000 Units by mouth daily.    [name] Kitchen losartan-hydrochlorothiazide (HYZAAR) 100-12.5 MG tablet Take 1 tablet by mouth daily. 90 tablet 3  . vitamin C (ASCORBIC ACID) 500 MG tablet Take 500 mg by mouth 2 (two) times daily.    [name] Kitchen atorvastatin (LIPITOR) 10 MG tablet Take 1 tablet (10 mg total) by mouth daily. 90 tablet 1  . hydrochlorothiazide (HYDRODIURIL) 12.5 MG tablet Take 1 tablet (12.5 mg total) by mouth daily. 30 tablet 2  . co-enzyme Q-10 30 MG capsule Take 30 mg by mouth daily.     No facility-administered medications prior to visit.     ROS See HPI  Objective:  BP 130/78   Pulse (!) 53   Temp 98.1 F (36.7 C)   Ht 5\' 11"  (1.803 m)   Wt (!) 339 lb (153.8 kg)   SpO2 98%   BMI 47.28 kg/m   BP Readings from Last 3 Encounters:  10/31/16 130/78  10/22/16 (!) 144/86  10/03/16 136/84    Wt Readings from Last 3 Encounters:  10/31/16 (!) 339 lb (153.8 kg)  09/03/16 (!) 339 lb 6.4 oz (154 kg)  07/30/16 (!) 334 lb (151.5 kg)    Physical Exam  Constitutional: She is oriented to person, place, and time. No distress.  Cardiovascular: Normal rate and regular rhythm.   Pulmonary/Chest: Effort normal.  Musculoskeletal: She exhibits no edema.  Neurological: She is alert and oriented to  person, place, and time.  Skin: Skin is warm and dry.  Psychiatric: She has a normal mood and affect. Her behavior is normal.  Vitals reviewed.   Lab Results  Component Value Date   WBC 4.8 03/17/2014   HGB 12.6 03/17/2014   HCT 38.4 03/17/2014   PLT 261.0 03/17/2014   GLUCOSE 117 (H) 10/31/2016   CHOL 228 (H) 03/29/2016   TRIG 117.0 03/29/2016   HDL 54.50 03/29/2016   LDLCALC 150 (H) 03/29/2016   ALT 16 10/31/2016   AST 15 10/31/2016   NA 138 10/31/2016   K 3.8 10/31/2016   CL 98 10/31/2016   CREATININE 1.56 (H) 10/31/2016   BUN 13 10/31/2016   CO2 33 (H) 10/31/2016   TSH 1.17 03/29/2016   HGBA1C 6.3 03/29/2016    US Breast Ltd Uni Left Inc Axilla  Result Date: 10/30/2016 CLINICAL DATA:  Possible left breast mass seen on most recent screening mammography. EXAM: 2D DIGITAL DIAGNOSTIC LEFT MAMMOGRAM WITH CAD AND ADJUNCT TOMO ULTRASOUND LEFT BREAST COMPARISON:  Previous exam(s). ACR Breast Density Category b: There are scattered areas of fibroglandular density. FINDINGS: Additional mammographic views demonstrate persistent macro lobulated low-density benign-appearing superficially located mass in the lower inner left breast, anterior depth. Mammographic images were processed with CAD. On physical exam, no suspicious masses are palpated. Targeted ultrasound is  performed, showing a multiloculated septated hypoechoic to anechoic structure in the left 8 o'clock breast 2 cm from the nipple, which is partially located within the skin. This finding corresponds to the mammographically seen probably benign mass. IMPRESSION: Left breast 8 o'clock probably benign superficially located mass, for which six-month follow-up is recommended. RECOMMENDATION: Diagnostic mammogram and possibly ultrasound of the left breast in 6 months. (Code:DM-L-40M) I have discussed the findings and recommendations with the patient. Results were also provided in writing at the conclusion of the visit. If applicable, a  reminder letter will be sent to the patient regarding the next appointment. BI-RADS CATEGORY  3: Probably benign. Electronically Signed   By: Fidela Salisbury M.D.   On: 10/30/2016 12:17   Mm Diag Breast Tomo Uni Left  Result Date: 10/30/2016 CLINICAL DATA:  Possible left breast mass seen on most recent screening mammography. EXAM: 2D DIGITAL DIAGNOSTIC LEFT MAMMOGRAM WITH CAD AND ADJUNCT TOMO ULTRASOUND LEFT BREAST COMPARISON:  Previous exam(s). ACR Breast Density Category b: There are scattered areas of fibroglandular density. FINDINGS: Additional mammographic views demonstrate persistent macro lobulated low-density benign-appearing superficially located mass in the lower inner left breast, anterior depth. Mammographic images were processed with CAD. On physical exam, no suspicious masses are palpated. Targeted ultrasound is performed, showing a multiloculated septated hypoechoic to anechoic structure in the left 8 o'clock breast 2 cm from the nipple, which is partially located within the skin. This finding corresponds to the mammographically seen probably benign mass. IMPRESSION: Left breast 8 o'clock probably benign superficially located mass, for which six-month follow-up is recommended. RECOMMENDATION: Diagnostic mammogram and possibly ultrasound of the left breast in 6 months. (Code:DM-L-40M) I have discussed the findings and recommendations with the patient. Results were also provided in writing at the conclusion of the visit. If applicable, a reminder letter will be sent to the patient regarding the next appointment. BI-RADS CATEGORY  3: Probably benign. Electronically Signed   By: Fidela Salisbury M.D.   On: 10/30/2016 12:17    Assessment & Plan:   [name] was seen today for follow-up.  Diagnoses and all orders for this visit:  Essential hypertension -     Basic metabolic panel; Future  Hyperlipidemia LDL goal <70 -     atorvastatin (LIPITOR) 10 MG tablet; Take 1 tablet (10 mg  total) by mouth daily. -     Hepatic function panel; Future -     CK; Future   I have discontinued Ms. Hinchcliff's hydrochlorothiazide. I am also having her maintain her amLODipine, Sphygmomanometer, losartan-hydrochlorothiazide, Cholecalciferol, co-enzyme Q-10, vitamin C, Biotin, Co Q-10, B-12, and atorvastatin.  Meds ordered this encounter  Medications  . Biotin 5000 MCG CAPS    Sig: Take by mouth.  . Coenzyme Q10 (CO Q-10) 100 MG CAPS    Sig: Take by mouth.  . Cyanocobalamin (B-12) 2500 MCG TABS    Sig: Take by mouth.  [name] Kitchen atorvastatin (LIPITOR) 10 MG tablet    Sig: Take 1 tablet (10 mg total) by mouth daily.    Dispense:  90 tablet    Refill:  1    Order Specific Question:   Supervising Provider    Answer:   Binnie Rail [[id number]]   Follow-up: Return in about 6 months (around 05/01/2017) for CPE (fasting) at [place] location.  Wilfred Lacy, NP

## 2016-10-31 NOTE — Patient Instructions (Addendum)
Decrease in renal function with increase HCTZ dose. Hold additional HCTZ dose. F/up with cardiology. Continue to check BP at home and record.

## 2016-11-25 NOTE — Progress Notes (Signed)
Patient ID: Rose Miller                 DOB: Jul 06, 1955                      MRN: [id number]     HPI: Rose Miller is a 61 y.o. female referred by Dr. Corey Skains HTN clinic.PMH is significant for HTN, HLD, and obesity. Atenolol was previously tapered due to bradycardia. At HTN visit on 9/20 losartan was increased to 100mg  daily. At last HTN clinic appointment on 10/9 BP remained elevated so HCTZ was increased to 25mg  daily. However, repeat BMET one week later showed SCr rose from 1.01 to 1.56 and the dose was reduced back to 12.5mg  by her PCP.   Pt presents today for HTN F/U in good spirits with her sister in law. She states that she thinks her "BP is not too well-controlled." She does not have her log of home BP readings but reports they have mostly been in the 150-160s/80s. Pt also states that she never received communication from her PCP to stop the extra dose of HCTZ and has continued taking it. Pt states that she feels dizzy occasionally but overall feels okay. She denies falls, reports compliance with low salt diet, and is trying to incorporate walking into her daily routine more often.  Current HTN meds:amlodipine 10mg  daily, losartan-HCTZ 100-12.5mg  daily + HCTZ 12.5mg   Previously tried:atenolol 25mg  (persistently <50bpm with intermittent exertional dyspnea), HCTZ 25mg  daily (SCr increase - pt never stopped higher dose)  BP goal: <130/49mmHg  Family History: The patient's family history includes Arthritis in her mother; Cancer in her father and mother; Colon cancer in her father; Hypertension in her mother.Brother has a history of rheumatic heart disease and is status post mitral valve replacement.  Social History: Former smoker, denies alcohol and drug use.   Diet:Tries to maintain low salt diet. Provided information on the DASH diet 07/30/16.   Exercise:Pt has increased her walking throughout but this is not regular.   Home BP readings: Pt did not bring her log but  recent readings have been 150-160s/80s.  Wt Readings from Last 3 Encounters:  10/31/16 (!) 339 lb (153.8 kg)  09/03/16 (!) 339 lb 6.4 oz (154 kg)  07/30/16 (!) 334 lb (151.5 kg)   BP Readings from Last 3 Encounters:  10/31/16 130/78  10/22/16 (!) 144/86  10/03/16 136/84   Pulse Readings from Last 3 Encounters:  10/31/16 (!) 53  10/22/16 71  10/03/16 78    Renal function: CrCl cannot be calculated (Patient's most recent lab result is older than the maximum 21 days allowed.).  Past Medical History:  Diagnosis Date  . Allergy   . Arthritis   . Asthma   . Heart murmur   . Hypertension     Current Outpatient Medications on File Prior to Visit  Medication Sig Dispense Refill  . amLODipine (NORVASC) 10 MG tablet Take 1 tablet (10 mg total) by mouth daily. 90 tablet 3  . atorvastatin (LIPITOR) 10 MG tablet Take 1 tablet (10 mg total) by mouth daily. 90 tablet 1  . Biotin 5000 MCG CAPS Take by mouth.    . Blood Pressure Monitoring (SPHYGMOMANOMETER) MISC 1 Units by Does not apply route daily. 1 each 0  . Cholecalciferol (EQL VITAMIN D3) 1000 units tablet Take 1,000 Units by mouth daily.    [name] Kitchen co-enzyme Q-10 30 MG capsule Take 30 mg by mouth daily.    . Coenzyme Q10 (CO  Q-10) 100 MG CAPS Take by mouth.    . Cyanocobalamin (B-12) 2500 MCG TABS Take by mouth.    . losartan-hydrochlorothiazide (HYZAAR) 100-12.5 MG tablet Take 1 tablet by mouth daily. 90 tablet 3  . vitamin C (ASCORBIC ACID) 500 MG tablet Take 500 mg by mouth 2 (two) times daily.     No current facility-administered medications on file prior to visit.     No Known Allergies   Assessment/Plan:  1. Hypertension - BP remains above goal <130/80 mmHg in clinic and on home readings per patient report. Pt remains on higher dose of HCTZ 25mg  total daily as she did not receive communication from PCP to decrease dose to 12.5mg  due to renal dysfunction. Will continue amlodipine 10mg  daily and losartan-HCTZ 100-12.5mg  daily,  and stop extra HCTZ 12.5mg  daily dose for now. Discussed hydralazine and spironolactone as options for further BP control - pt prefers spironolactone given once daily dosing. Will check BMET today and depending on results will plan to initiate spironolactone. If BMET has normalized will continue higher dose of HCTZ.  Arrie Senate, PharmD PGY-2 Cardiology Pharmacy Resident Pager: [phone] 11/26/2016

## 2016-11-26 ENCOUNTER — Other Ambulatory Visit: Payer: Self-pay | Admitting: Interventional Cardiology

## 2016-11-26 ENCOUNTER — Ambulatory Visit (INDEPENDENT_AMBULATORY_CARE_PROVIDER_SITE_OTHER): Payer: BLUE CROSS/BLUE SHIELD | Admitting: Pharmacist

## 2016-11-26 VITALS — BP 152/86 | HR 78

## 2016-11-26 DIAGNOSIS — I10 Essential (primary) hypertension: Secondary | ICD-10-CM

## 2016-11-26 NOTE — Patient Instructions (Signed)
It was great to see you today!  Continue taking amlodipine 10mg  daily and losartan-HCTZ 100-12.5mg  daily.  Stop taking the extra HCTZ 12.5mg  daily for now.  We will get blood work today to see how your kidneys are working and call you tomorrow with the results.

## 2016-11-27 ENCOUNTER — Telehealth: Payer: Self-pay | Admitting: Pharmacist

## 2016-11-27 DIAGNOSIS — I10 Essential (primary) hypertension: Secondary | ICD-10-CM

## 2016-11-27 LAB — BASIC METABOLIC PANEL
BUN / CREAT RATIO: 10 — AB (ref 12–28)
BUN: 14 mg/dL (ref 8–27)
CHLORIDE: 100 mmol/L (ref 96–106)
CO2: 26 mmol/L (ref 20–29)
Calcium: 9.6 mg/dL (ref 8.7–10.3)
Creatinine, Ser: 1.37 mg/dL — ABNORMAL HIGH (ref 0.57–1.00)
GFR calc Af Amer: 48 mL/min/{1.73_m2} — ABNORMAL LOW (ref >59)
GFR calc non Af Amer: 41 mL/min/{1.73_m2} — ABNORMAL LOW (ref >59)
GLUCOSE: 92 mg/dL (ref 65–99)
POTASSIUM: 3.7 mmol/L (ref 3.5–5.2)
SODIUM: 143 mmol/L (ref 134–144)

## 2016-11-27 MED ORDER — SPIRONOLACTONE 25 MG PO TABS: 12.5000 mg | ORAL_TABLET | Freq: Every day | ORAL | 3 refills | Status: DC

## 2016-11-27 MED ORDER — LOSARTAN POTASSIUM-HCTZ 100-25 MG PO TABS: 1.0000 | ORAL_TABLET | Freq: Every day | ORAL | 3 refills | Status: DC

## 2016-11-27 NOTE — Telephone Encounter (Signed)
Follow-up BMET reviewed. Pt had never lowered dose of HCTZ from 25mg  to 12.5mg  as recommended by provider. SCr now appears to have normalized close to patient's baseline so will continue losartan-HCTZ 100mg -25mg  daily and add spironolactone 12.5mg  daily. F/U BMET scheduled in 10 days, pt to return to HTN clinic in 4 weeks.

## 2016-11-28 ENCOUNTER — Telehealth: Payer: Self-pay | Admitting: Interventional Cardiology

## 2016-11-28 NOTE — Telephone Encounter (Signed)
New message    Patient calling to clarify dosage and frequency of medication.   Pt c/o medication issue:  1. Name of Medication: spironolactone (ALDACTONE) 25 MG tablet  2. How are you currently taking this medication (dosage and times per day)? n/a  3. Are you having a reaction (difficulty breathing--STAT)? NO  4. What is your medication issue? Patient calling to clarify dosage and frequency

## 2016-11-28 NOTE — Telephone Encounter (Signed)
Other phone note states for pt to take 12.5 mg of Spironolactone.Pt notified .Adonis Housekeeper

## 2016-12-10 ENCOUNTER — Other Ambulatory Visit: Payer: BLUE CROSS/BLUE SHIELD

## 2016-12-10 DIAGNOSIS — I10 Essential (primary) hypertension: Secondary | ICD-10-CM

## 2016-12-10 LAB — BASIC METABOLIC PANEL
BUN / CREAT RATIO: 21 (ref 12–28)
BUN: 26 mg/dL (ref 8–27)
CO2: 27 mmol/L (ref 20–29)
Calcium: 9.3 mg/dL (ref 8.7–10.3)
Chloride: 103 mmol/L (ref 96–106)
Creatinine, Ser: 1.24 mg/dL — ABNORMAL HIGH (ref 0.57–1.00)
GFR calc Af Amer: 54 mL/min/{1.73_m2} — ABNORMAL LOW (ref >59)
GFR, EST NON AFRICAN AMERICAN: 47 mL/min/{1.73_m2} — AB (ref >59)
GLUCOSE: 118 mg/dL — AB (ref 65–99)
POTASSIUM: 4.2 mmol/L (ref 3.5–5.2)
SODIUM: 141 mmol/L (ref 134–144)

## 2016-12-24 ENCOUNTER — Ambulatory Visit: Payer: BLUE CROSS/BLUE SHIELD

## 2016-12-26 ENCOUNTER — Ambulatory Visit (INDEPENDENT_AMBULATORY_CARE_PROVIDER_SITE_OTHER): Payer: BLUE CROSS/BLUE SHIELD | Admitting: Pharmacist

## 2016-12-26 VITALS — BP 140/76 | HR 64

## 2016-12-26 DIAGNOSIS — I10 Essential (primary) hypertension: Secondary | ICD-10-CM | POA: Diagnosis not present

## 2016-12-26 LAB — BASIC METABOLIC PANEL
BUN/Creatinine Ratio: 15 (ref 12–28)
BUN: 26 mg/dL (ref 8–27)
CO2: 28 mmol/L (ref 20–29)
Calcium: 9.2 mg/dL (ref 8.7–10.3)
Chloride: 98 mmol/L (ref 96–106)
Creatinine, Ser: 1.77 mg/dL — ABNORMAL HIGH (ref 0.57–1.00)
GFR calc Af Amer: 35 mL/min/{1.73_m2} — ABNORMAL LOW (ref >59)
GFR calc non Af Amer: 30 mL/min/{1.73_m2} — ABNORMAL LOW (ref >59)
Glucose: 98 mg/dL (ref 65–99)
Potassium: 4.2 mmol/L (ref 3.5–5.2)
Sodium: 139 mmol/L (ref 134–144)

## 2016-12-26 MED ORDER — HYDRALAZINE HCL 25 MG PO TABS: 12.5000 mg | ORAL_TABLET | Freq: Two times a day (BID) | ORAL | 3 refills | Status: DC

## 2016-12-26 MED ORDER — HYDROCHLOROTHIAZIDE 12.5 MG PO CAPS: 12.5000 mg | ORAL_CAPSULE | Freq: Every day | ORAL | 0 refills | Status: DC

## 2016-12-26 NOTE — Patient Instructions (Addendum)
Return for a follow up appointment in 1 week for BMET  Check your blood pressure at home daily (if able) and keep record of the readings.  Take your BP meds as follows: Continue Losartan/HCTZ 100/25mg  daily, and amlodipine 10mg  daily   We will plan to increase your spironolactone to 25mg  (1 tablet) pending blood work.   Bring all of your meds, your BP cuff and your record of home blood pressures to your next appointment.  Exercise as you're able, try to walk approximately 30 minutes per day.  Keep salt intake to a minimum, especially watch canned and prepared boxed foods.  Eat more fresh fruits and vegetables and fewer canned items.  Avoid eating in fast food restaurants.    HOW TO TAKE YOUR BLOOD PRESSURE: . Rest 5 minutes before taking your blood pressure. .  Don't smoke or drink caffeinated beverages for at least 30 minutes before. . Take your blood pressure before (not after) you eat. . Sit comfortably with your back supported and both feet on the floor (don't cross your legs). . Elevate your arm to heart level on a table or a desk. . Use the proper sized cuff. It should fit smoothly and snugly around your bare upper arm. There should be enough room to slip a fingertip under the cuff. The bottom edge of the cuff should be 1 inch above the crease of the elbow. . Ideally, take 3 measurements at one sitting and record the average.

## 2016-12-26 NOTE — Progress Notes (Signed)
Patient ID: Rose Miller                 DOB: [birth year]                      MRN: [id number]     HPI: Rose Miller is a 61 y.o. female patient of Dr.Smith who presents today for hypertension follow up. PMH is significant for HTN, HLD, and obesity. Atenololwas previously tapered due to bradycardia.AtHTN visit on 9/20 losartan was increased to 100mg  daily. Previously, on 10/9 BP remained elevated so HCTZ was increased to 25mg  daily. However, repeat BMET one week later showed SCr rose from 1.01 to 1.56 and the dose was reduced back to 12.5mg  by her PCP. At her most recent visit in HTN clinic, she was found to still be taking HCTZ 25mg . Her BMET had normalized on her current dose so HCTZ 25mg  was continued and spironolactone was added for better BP control.   She presents today for follow up. She states she feels well. She occasionally has dizziness, but does not associate it with any thing in particular. She denies SOB or chest pain.    Current HTN meds:  Amlodipine 10mg  daily Losartan/HCTZ 100/12.5mg  daily  Spironolactone 12.5mg  daily   Previously tried:atenolol 25mg  (persistently <50bpm with intermittent exertional dyspnea), HCTZ 25mg  daily (SCr increase - pt never stopped higher dose)  BP goal: <130/57mmHg  Family History: The patient's family history includes Arthritis in her mother; Cancer in her father and mother; Colon cancer in her father; Hypertension in her mother.Brother has a history of rheumatic heart disease and is status post mitral valve replacement.  Social History: Former smoker, denies alcohol and drug use.   Diet:Tries to maintain low salt diet. Provided information on the DASH diet 07/30/16.   Exercise:Pt has increased her walking throughout but this is not regular.   Home BP readings: 110s-160s/60-70s trending down, but still several above goal   Wt Readings from Last 3 Encounters:  10/31/16 (!) 339 lb (153.8 kg)  09/03/16 (!) 339 lb 6.4 oz (154 kg)    07/30/16 (!) 334 lb (151.5 kg)   BP Readings from Last 3 Encounters:  12/26/16 140/76  11/26/16 (!) 152/86  10/31/16 130/78   Pulse Readings from Last 3 Encounters:  12/26/16 64  11/26/16 78  10/31/16 (!) 53    Renal function: CrCl cannot be calculated (Unknown ideal weight.).  Past Medical History:  Diagnosis Date  . Allergy   . Arthritis   . Asthma   . Heart murmur   . Hypertension     Current Outpatient Medications on File Prior to Visit  Medication Sig Dispense Refill  . amLODipine (NORVASC) 10 MG tablet Take 1 tablet (10 mg total) by mouth daily. 90 tablet 3  . atorvastatin (LIPITOR) 10 MG tablet Take 1 tablet (10 mg total) by mouth daily. 90 tablet 1  . Biotin 5000 MCG CAPS Take by mouth.    . Blood Pressure Monitoring (SPHYGMOMANOMETER) MISC 1 Units by Does not apply route daily. 1 each 0  . Cholecalciferol (EQL VITAMIN D3) 1000 units tablet Take 1,000 Units by mouth daily.    . Cyanocobalamin (B-12) 2500 MCG TABS Take by mouth.    . losartan-hydrochlorothiazide (HYZAAR) 100-25 MG tablet Take 1 tablet daily by mouth. 90 tablet 3  . spironolactone (ALDACTONE) 25 MG tablet Take 0.5 tablets (12.5 mg total) daily by mouth. 30 tablet 3  . vitamin C (ASCORBIC ACID) 500 MG tablet Take 500  mg by mouth 2 (two) times daily.    [name] Kitchen co-enzyme Q-10 30 MG capsule Take 30 mg by mouth daily.    . Coenzyme Q10 (CO Q-10) 100 MG CAPS Take by mouth.     No current facility-administered medications on file prior to visit.     No Known Allergies  Blood pressure 140/76, pulse 64.   Assessment/Plan: Hypertension: BMET today, pending results will increase spironolactone to 25mg .    Addendum: BMET returned with bump in Scr. Spoke with patient and advised to stop spironolactone. She was very resistant to taking any medications more than once daily, but after lengthy conversation willing to try hydralazine 12.5mg  BID (ideally dosed TID or QID). She states she may be willing to increase  to TID if she is able to remember BID dosing. Repeat BMET in 1 week and follow up in HTN clinic in 3 weeks.    Thank you, Rose Miller [place], [facility]  12/26/2016 12:05 PM

## 2016-12-27 ENCOUNTER — Telehealth: Payer: Self-pay | Admitting: Pharmacist

## 2016-12-27 MED ORDER — LOSARTAN POTASSIUM-HCTZ 100-25 MG PO TABS: 1.0000 | ORAL_TABLET | Freq: Every day | ORAL | 3 refills | Status: DC

## 2016-12-27 NOTE — Telephone Encounter (Signed)
Pt calls into stating that she needs new RX sent for losartan/HCTZ 100/25mg  daily. This has been done.

## 2017-01-02 ENCOUNTER — Other Ambulatory Visit: Payer: BLUE CROSS/BLUE SHIELD | Admitting: *Deleted

## 2017-01-02 DIAGNOSIS — I10 Essential (primary) hypertension: Secondary | ICD-10-CM

## 2017-01-03 LAB — BASIC METABOLIC PANEL
BUN / CREAT RATIO: 14 (ref 12–28)
BUN: 17 mg/dL (ref 8–27)
CO2: 26 mmol/L (ref 20–29)
CREATININE: 1.2 mg/dL — AB (ref 0.57–1.00)
Calcium: 9.7 mg/dL (ref 8.7–10.3)
Chloride: 103 mmol/L (ref 96–106)
GFR calc non Af Amer: 48 mL/min/{1.73_m2} — ABNORMAL LOW (ref >59)
GFR, EST AFRICAN AMERICAN: 56 mL/min/{1.73_m2} — AB (ref >59)
Glucose: 91 mg/dL (ref 65–99)
Potassium: 3.9 mmol/L (ref 3.5–5.2)
Sodium: 144 mmol/L (ref 134–144)

## 2017-01-21 ENCOUNTER — Ambulatory Visit (INDEPENDENT_AMBULATORY_CARE_PROVIDER_SITE_OTHER): Payer: BLUE CROSS/BLUE SHIELD | Admitting: Pharmacist

## 2017-01-21 VITALS — BP 142/80 | HR 82

## 2017-01-21 DIAGNOSIS — I10 Essential (primary) hypertension: Secondary | ICD-10-CM | POA: Diagnosis not present

## 2017-01-21 NOTE — Patient Instructions (Addendum)
INCREASE hydralazine to 25mg  TWICE daily (take 1 tablet twice daily).  Continue to monitor pressures.   Follow up in 3-4 weeks.

## 2017-01-21 NOTE — Progress Notes (Signed)
Patient ID: Rose Miller                 DOB: 10-25-1955                      MRN: [id number]     HPI: Rose Miller is a 62 y.o. female patient of Dr. [name] who presents today for hypertension follow up. PMH is significant for HTN, HLD, and obesity. Atenololwas previously tapered due to bradycardia.AtHTN visit on 9/20 losartan was increased to 100mg  daily.Previously, on 10/9 BP remained elevated so HCTZ was increased to 25mg  daily. However, repeat BMET one week later showed SCr rose from 1.01 to 1.56 and the dose was reduced back to 12.5mg by her PCP.She was found at her next visit to still be taking HCTZ 25mg  daily and her Scr had normalized, thus HCTZ was continued and spironolactone started. Repeat BMET revealed another bump in Scr and spiro was stopped. She agreed to start on hydralazine 12.5mg  BID despite resistance to medications more than once daily.    She presents today for follow up. She denies, dizziness, SOB, and chest pain. She states she has done well on hydralazine and is proud that she has not missed a dose of her hydralazine. She has also started working out more and is hoping to lose some weight.   She did have a period of elevated pressures (329-924Q systolic) which were when she was at a funeral in [facility]. Otherwise her pressures range 110-140s mostly running in 683M systolic and 19Q diastolic.   Current HTN meds:  Amlodipine 10mg  daily Losartan/HCTZ 100/12.5mg  daily  Hydralazine 12.5mg  BID  Previously tried:atenolol 25mg  (persistently <50bpm with intermittent exertional dyspnea), HCTZ 25mg  daily (SCr increase- pt never stopped higher dose)  BP goal: <130/79mmHg  Family History: The patient's family history includes Arthritis in her mother; Cancer in her father and mother; Colon cancer in her father; Hypertension in her mother.Brother has a history of rheumatic heart disease and is status post mitral valve replacement.  Social History: Former smoker, denies  alcohol and drug use.   Diet:Tries to maintain low salt diet. Provided information on the DASH diet 07/30/16.   Exercise:Pt has increased her walking throughout but this is not regular.  Home BP readings:  113-170 mostly 130s/ 58-88 mostly 70s.   Wt Readings from Last 3 Encounters:  10/31/16 (!) 339 lb (153.8 kg)  09/03/16 (!) 339 lb 6.4 oz (154 kg)  07/30/16 (!) 334 lb (151.5 kg)   BP Readings from Last 3 Encounters:  01/21/17 (!) 142/80  12/26/16 140/76  11/26/16 (!) 152/86   Pulse Readings from Last 3 Encounters:  01/21/17 82  12/26/16 64  11/26/16 78    Renal function: CrCl cannot be calculated (Unknown ideal weight.).  Past Medical History:  Diagnosis Date  . Allergy   . Arthritis   . Asthma   . Heart murmur   . Hypertension     Current Outpatient Medications on File Prior to Visit  Medication Sig Dispense Refill  . amLODipine (NORVASC) 10 MG tablet Take 1 tablet (10 mg total) by mouth daily. 90 tablet 3  . atorvastatin (LIPITOR) 10 MG tablet Take 1 tablet (10 mg total) by mouth daily. 90 tablet 1  . hydrALAZINE (APRESOLINE) 25 MG tablet Take 1 tablet (25 mg total) by mouth 2 (two) times daily. 270 tablet 3  . losartan-hydrochlorothiazide (HYZAAR) 100-25 MG tablet Take 1 tablet by mouth daily. 90 tablet 3  . Biotin 5000 MCG CAPS  Take by mouth.    . Blood Pressure Monitoring (SPHYGMOMANOMETER) MISC 1 Units by Does not apply route daily. 1 each 0  . Cholecalciferol (EQL VITAMIN D3) 1000 units tablet Take 1,000 Units by mouth daily.    [name] Kitchen co-enzyme Q-10 30 MG capsule Take 30 mg by mouth daily.    . Coenzyme Q10 (CO Q-10) 100 MG CAPS Take by mouth.    . Cyanocobalamin (B-12) 2500 MCG TABS Take by mouth.    . vitamin C (ASCORBIC ACID) 500 MG tablet Take 500 mg by mouth 2 (two) times daily.     No current facility-administered medications on file prior to visit.     No Known Allergies  Blood pressure (!) 142/80, pulse 82.   Assessment/Plan: Hypertension:  BP not at goal today in office and remains mostly above goal on home cuff. Discussed TID dosing of hydralazine and she states she is fairly confident she would miss a midday dose of medication more days that not. For this reason she would prefer to increase her hydralazine to 25mg  BID. She will continue to monitor and follow up in HTN clinic in 3-4 weeks for additional titration. If at that time pressures not at goal would strongly consider TID dosing. Congratulated her on lifestyle improvements and adherence to BID dosing.    Thank you, Lelan Pons. Patterson Hammersmith, [facility]  01/21/2017 1:55 PM

## 2017-02-20 ENCOUNTER — Ambulatory Visit (INDEPENDENT_AMBULATORY_CARE_PROVIDER_SITE_OTHER): Payer: BLUE CROSS/BLUE SHIELD | Admitting: Pharmacist

## 2017-02-20 ENCOUNTER — Encounter: Payer: Self-pay | Admitting: Pharmacist

## 2017-02-20 VITALS — BP 122/70 | HR 68

## 2017-02-20 DIAGNOSIS — I10 Essential (primary) hypertension: Secondary | ICD-10-CM

## 2017-02-20 MED ORDER — HYDRALAZINE HCL 25 MG PO TABS: 25.0000 mg | ORAL_TABLET | Freq: Two times a day (BID) | ORAL | 3 refills | Status: DC

## 2017-02-20 NOTE — Progress Notes (Signed)
Patient ID: Rose Miller                 DOB: May 16, 1955                      MRN: [id number]     HPI: Rose Miller is a 62 y.o. female patient of Dr. [name] who presents today for hypertension follow up. PMH is significant for HTN, HLD, and obesity. Atenololwas previously tapered due to bradycardia.AtHTN visit on 9/20 losartan was increased to 100mg  daily.Previously, on 10/9 BP remained elevated so HCTZ was increased to 25mg  daily. However, repeat BMET one week later showed SCr rose from 1.01 to 1.56 and the dose was reduced back to 12.5mg by her PCP.She was found at her next visit to still be taking HCTZ 25mg  daily and her Scr had normalized, thus HCTZ was continued and spironolactone started. Repeat BMET revealed another bump in Scr and spiro was stopped. She agreed to start on hydralazine 12.5mg  BID despite resistance to medications more than once daily.  At her last OV she agreed to increase hydralazine to 25mg  BID, but was resistant to TID dosing.    She presents today for follow up. She denies chest pain and SOB. She does state that occasionally she is getting dizzy, but otherwise feels she is doing well. She has not missed a dose of medication since our last visit.    Current HTN meds:  Amlodipine 10mg  daily Losartan/HCTZ 100/12.5mg  daily  Hydralazine 25mg  BID  Previously tried:atenolol 25mg  (persistently <50bpm with intermittent exertional dyspnea), HCTZ 25mg  daily (SCr increase- pt never stopped higher dose)  BP goal: <130/68mmHg  Family History: The patient's family history includes Arthritis in her mother; Cancer in her father and mother; Colon cancer in her father; Hypertension in her mother.Brother has a history of rheumatic heart disease and is status post mitral valve replacement.  Social History: Former smoker, denies alcohol and drug use.   Diet:Tries to maintain low salt diet. Provided information on the DASH diet 07/30/16.   Exercise:Pt has increased  her walking throughout but this is not regular.  Home BP readings:  109-152/59-77 mostly running in the 120s/60s.    Wt Readings from Last 3 Encounters:  10/31/16 (!) 339 lb (153.8 kg)  09/03/16 (!) 339 lb 6.4 oz (154 kg)  07/30/16 (!) 334 lb (151.5 kg)   BP Readings from Last 3 Encounters:  02/20/17 122/70  01/21/17 (!) 142/80  12/26/16 140/76   Pulse Readings from Last 3 Encounters:  02/20/17 68  01/21/17 82  12/26/16 64    Renal function: CrCl cannot be calculated (Patient's most recent lab result is older than the maximum 21 days allowed.).  Past Medical History:  Diagnosis Date  . Allergy   . Arthritis   . Asthma   . Heart murmur   . Hypertension     Current Outpatient Medications on File Prior to Visit  Medication Sig Dispense Refill  . amLODipine (NORVASC) 10 MG tablet Take 1 tablet (10 mg total) by mouth daily. 90 tablet 3  . atorvastatin (LIPITOR) 10 MG tablet Take 1 tablet (10 mg total) by mouth daily. 90 tablet 1  . Biotin 5000 MCG CAPS Take by mouth.    . Blood Pressure Monitoring (SPHYGMOMANOMETER) MISC 1 Units by Does not apply route daily. 1 each 0  . Cholecalciferol (EQL VITAMIN D3) 1000 units tablet Take 1,000 Units by mouth daily.    [name] Kitchen co-enzyme Q-10 30 MG capsule Take 30 mg by  mouth daily.    . Coenzyme Q10 (CO Q-10) 100 MG CAPS Take by mouth.    . Cyanocobalamin (B-12) 2500 MCG TABS Take by mouth.    . losartan-hydrochlorothiazide (HYZAAR) 100-25 MG tablet Take 1 tablet by mouth daily. 90 tablet 3  . vitamin C (ASCORBIC ACID) 500 MG tablet Take 500 mg by mouth 2 (two) times daily.     No current facility-administered medications on file prior to visit.     No Known Allergies  Blood pressure 122/70, pulse 68.    Assessment/Plan: Hypertension: BP today at goal. She is mostly controlled at home. Discussed proper cuff technique (which may be contributing to high readings). Continue current medication regimen. Continue to monitor pressures.  Follow up with Dr. [name] as scheduled and HTN clinic as needed.   Thank you, Lelan Pons. Patterson Hammersmith, [facility]  02/20/2017 2:30 PM

## 2017-02-20 NOTE — Patient Instructions (Signed)
Return for a follow up appointment as recommended with Dr. [name]  Check your blood pressure at home daily (if able) and keep record of the readings.  Take your BP meds as follows: CONTINUE all as prescribed  Bring all of your meds, your BP cuff and your record of home blood pressures to your next appointment.  Exercise as you're able, try to walk approximately 30 minutes per day.  Keep salt intake to a minimum, especially watch canned and prepared boxed foods.  Eat more fresh fruits and vegetables and fewer canned items.  Avoid eating in fast food restaurants.    HOW TO TAKE YOUR BLOOD PRESSURE: . Rest 5 minutes before taking your blood pressure. .  Don't smoke or drink caffeinated beverages for at least 30 minutes before. . Take your blood pressure before (not after) you eat. . Sit comfortably with your back supported and both feet on the floor (don't cross your legs). . Elevate your arm to heart level on a table or a desk. . Use the proper sized cuff. It should fit smoothly and snugly around your bare upper arm. There should be enough room to slip a fingertip under the cuff. The bottom edge of the cuff should be 1 inch above the crease of the elbow. . Ideally, take 3 measurements at one sitting and record the average.

## 2017-03-31 ENCOUNTER — Encounter: Payer: Self-pay | Admitting: Nurse Practitioner

## 2017-03-31 ENCOUNTER — Ambulatory Visit: Payer: BLUE CROSS/BLUE SHIELD | Admitting: Nurse Practitioner

## 2017-03-31 VITALS — BP 142/72 | HR 73 | Temp 98.2°F | Ht 71.0 in | Wt 344.0 lb

## 2017-03-31 DIAGNOSIS — E785 Hyperlipidemia, unspecified: Secondary | ICD-10-CM | POA: Diagnosis not present

## 2017-03-31 DIAGNOSIS — Z0001 Encounter for general adult medical examination with abnormal findings: Secondary | ICD-10-CM

## 2017-03-31 DIAGNOSIS — R739 Hyperglycemia, unspecified: Secondary | ICD-10-CM | POA: Diagnosis not present

## 2017-03-31 DIAGNOSIS — R140 Abdominal distension (gaseous): Secondary | ICD-10-CM

## 2017-03-31 DIAGNOSIS — R748 Abnormal levels of other serum enzymes: Secondary | ICD-10-CM

## 2017-03-31 DIAGNOSIS — E66813 Obesity, class 3: Secondary | ICD-10-CM

## 2017-03-31 DIAGNOSIS — E119 Type 2 diabetes mellitus without complications: Secondary | ICD-10-CM | POA: Insufficient documentation

## 2017-03-31 DIAGNOSIS — E1165 Type 2 diabetes mellitus with hyperglycemia: Secondary | ICD-10-CM | POA: Insufficient documentation

## 2017-03-31 DIAGNOSIS — E669 Obesity, unspecified: Secondary | ICD-10-CM | POA: Insufficient documentation

## 2017-03-31 DIAGNOSIS — K5900 Constipation, unspecified: Secondary | ICD-10-CM | POA: Diagnosis not present

## 2017-03-31 DIAGNOSIS — Z6841 Body Mass Index (BMI) 40.0 and over, adult: Secondary | ICD-10-CM | POA: Diagnosis not present

## 2017-03-31 DIAGNOSIS — N1832 Chronic kidney disease, stage 3b: Secondary | ICD-10-CM | POA: Insufficient documentation

## 2017-03-31 DIAGNOSIS — E1169 Type 2 diabetes mellitus with other specified complication: Secondary | ICD-10-CM | POA: Insufficient documentation

## 2017-03-31 DIAGNOSIS — I10 Essential (primary) hypertension: Secondary | ICD-10-CM

## 2017-03-31 DIAGNOSIS — N183 Chronic kidney disease, stage 3 unspecified: Secondary | ICD-10-CM

## 2017-03-31 LAB — CBC
HCT: 34.7 % — ABNORMAL LOW (ref 36.0–46.0)
HEMOGLOBIN: 11.6 g/dL — AB (ref 12.0–15.0)
MCHC: 33.4 g/dL (ref 30.0–36.0)
MCV: 88.7 fl (ref 78.0–100.0)
PLATELETS: 233 10*3/uL (ref 150.0–400.0)
RBC: 3.92 Mil/uL (ref 3.87–5.11)
RDW: 14.5 % (ref 11.5–15.5)
WBC: 4.3 10*3/uL (ref 4.0–10.5)

## 2017-03-31 LAB — COMPREHENSIVE METABOLIC PANEL
ALK PHOS: 126 U/L — AB (ref 39–117)
ALT: 386 U/L — ABNORMAL HIGH (ref 0–35)
AST: 494 U/L — ABNORMAL HIGH (ref 0–37)
Albumin: 4.2 g/dL (ref 3.5–5.2)
BUN: 14 mg/dL (ref 6–23)
CALCIUM: 9.7 mg/dL (ref 8.4–10.5)
CO2: 30 mEq/L (ref 19–32)
Chloride: 104 mEq/L (ref 96–112)
Creatinine, Ser: 1.22 mg/dL — ABNORMAL HIGH (ref 0.40–1.20)
GFR: 57.2 mL/min — AB (ref >60.00)
GLUCOSE: 94 mg/dL (ref 70–99)
POTASSIUM: 3.7 meq/L (ref 3.5–5.1)
Sodium: 143 mEq/L (ref 135–145)
Total Bilirubin: 0.7 mg/dL (ref 0.2–1.2)
Total Protein: 7.2 g/dL (ref 6.0–8.3)

## 2017-03-31 LAB — LIPID PANEL
CHOL/HDL RATIO: 2
Cholesterol: 142 mg/dL (ref 0–200)
HDL: 59.7 mg/dL (ref >39.00)
LDL CALC: 67 mg/dL (ref 0–99)
NonHDL: 82.36
TRIGLYCERIDES: 75 mg/dL (ref 0.0–149.0)
VLDL: 15 mg/dL (ref 0.0–40.0)

## 2017-03-31 LAB — TSH: TSH: 0.93 u[IU]/mL (ref 0.35–4.50)

## 2017-03-31 LAB — HEMOGLOBIN A1C: Hgb A1c MFr Bld: 6.7 % — ABNORMAL HIGH (ref 4.6–6.5)

## 2017-03-31 LAB — CK: CK TOTAL: 165 U/L (ref 7–177)

## 2017-03-31 MED ORDER — SENNA-DOCUSATE SODIUM 8.6-50 MG PO TABS: 1.0000 | ORAL_TABLET | Freq: Every day | ORAL | 1 refills | Status: DC

## 2017-03-31 MED ORDER — SACCHAROMYCES BOULARDII 250 MG PO CAPS: 250.0000 mg | ORAL_CAPSULE | Freq: Two times a day (BID) | ORAL | Status: DC

## 2017-03-31 MED ORDER — OMEPRAZOLE 20 MG PO CPDR
20.0000 mg | DELAYED_RELEASE_CAPSULE | Freq: Every day | ORAL | 1 refills | Status: DC
Start: 2017-03-31 — End: 2017-10-03

## 2017-03-31 NOTE — Patient Instructions (Signed)
Go to lab for blood draw. You will be called with results.  Will review lab results prior to sending losartan refill.  You will be contacted to schedule appt with dietician.  Keep food log to help count amount of calories consumed per day. Maintain low fat diet and small portions.  I recommend walking daily (at least 13mins).  DASH Eating Plan DASH stands for "Dietary Approaches to Stop Hypertension." The DASH eating plan is a healthy eating plan that has been shown to reduce high blood pressure (hypertension). It may also reduce your risk for type 2 diabetes, heart disease, and stroke. The DASH eating plan may also help with weight loss. What are tips for following this plan? General guidelines  Avoid eating more than 2,300 mg (milligrams) of salt (sodium) a day. If you have hypertension, you may need to reduce your sodium intake to 1,500 mg a day.  Limit alcohol intake to no more than 1 drink a day for nonpregnant women and 2 drinks a day for men. One drink equals 12 oz of beer, 5 oz of wine, or 1 oz of hard liquor.  Work with your health care provider to maintain a healthy body weight or to lose weight. Ask what an ideal weight is for you.  Get at least 30 minutes of exercise that causes your heart to beat faster (aerobic exercise) most days of the week. Activities may include walking, swimming, or biking.  Work with your health care provider or diet and nutrition specialist (dietitian) to adjust your eating plan to your individual calorie needs. Reading food labels  Check food labels for the amount of sodium per serving. Choose foods with less than 5 percent of the Daily Value of sodium. Generally, foods with less than 300 mg of sodium per serving fit into this eating plan.  To find whole grains, look for the word "whole" as the first word in the ingredient list. Shopping  Buy products labeled as "low-sodium" or "no salt added."  Buy fresh foods. Avoid canned foods and premade  or frozen meals. Cooking  Avoid adding salt when cooking. Use salt-free seasonings or herbs instead of table salt or sea salt. Check with your health care provider or pharmacist before using salt substitutes.  Do not fry foods. Cook foods using healthy methods such as baking, boiling, grilling, and broiling instead.  Cook with heart-healthy oils, such as olive, canola, soybean, or sunflower oil. Meal planning   Eat a balanced diet that includes: ? 5 or more servings of fruits and vegetables each day. At each meal, try to fill half of your plate with fruits and vegetables. ? Up to 6-8 servings of whole grains each day. ? Less than 6 oz of lean meat, poultry, or fish each day. A 3-oz serving of meat is about the same size as a deck of cards. One egg equals 1 oz. ? 2 servings of low-fat dairy each day. ? A serving of nuts, seeds, or beans 5 times each week. ? Heart-healthy fats. Healthy fats called Omega-3 fatty acids are found in foods such as flaxseeds and coldwater fish, like sardines, salmon, and mackerel.  Limit how much you eat of the following: ? Canned or prepackaged foods. ? Food that is high in trans fat, such as fried foods. ? Food that is high in saturated fat, such as fatty meat. ? Sweets, desserts, sugary drinks, and other foods with added sugar. ? Full-fat dairy products.  Do not salt foods before eating.  Try to eat at least 2 vegetarian meals each week.  Eat more home-cooked food and less restaurant, buffet, and fast food.  When eating at a restaurant, ask that your food be prepared with less salt or no salt, if possible. What foods are recommended? The items listed may not be a complete list. Talk with your dietitian about what dietary choices are best for you. Grains Whole-grain or whole-wheat bread. Whole-grain or whole-wheat pasta. Brown rice. [place]. Bulgur. Whole-grain and low-sodium cereals. Pita bread. Low-fat, low-sodium crackers. Whole-wheat flour  tortillas. Vegetables Fresh or frozen vegetables (raw, steamed, roasted, or grilled). Low-sodium or reduced-sodium tomato and vegetable juice. Low-sodium or reduced-sodium tomato sauce and tomato paste. Low-sodium or reduced-sodium canned vegetables. Fruits All fresh, dried, or frozen fruit. Canned fruit in natural juice (without added sugar). Meat and other protein foods Skinless chicken or [place]. Ground chicken or [place]. Pork with fat trimmed off. Fish and seafood. Egg whites. Dried beans, peas, or lentils. Unsalted nuts, nut butters, and seeds. Unsalted canned beans. Lean cuts of beef with fat trimmed off. Low-sodium, lean deli meat. Dairy Low-fat (1%) or fat-free (skim) milk. Fat-free, low-fat, or reduced-fat cheeses. Nonfat, low-sodium ricotta or cottage cheese. Low-fat or nonfat yogurt. Low-fat, low-sodium cheese. Fats and oils Soft margarine without trans fats. Vegetable oil. Low-fat, reduced-fat, or light mayonnaise and salad dressings (reduced-sodium). Canola, safflower, olive, soybean, and sunflower oils. Avocado. Seasoning and other foods Herbs. Spices. Seasoning mixes without salt. Unsalted popcorn and pretzels. Fat-free sweets. What foods are not recommended? The items listed may not be a complete list. Talk with your dietitian about what dietary choices are best for you. Grains Baked goods made with fat, such as croissants, muffins, or some breads. Dry pasta or rice meal packs. Vegetables Creamed or fried vegetables. Vegetables in a cheese sauce. Regular canned vegetables (not low-sodium or reduced-sodium). Regular canned tomato sauce and paste (not low-sodium or reduced-sodium). Regular tomato and vegetable juice (not low-sodium or reduced-sodium). Angie Fava. Olives. Fruits Canned fruit in a light or heavy syrup. Fried fruit. Fruit in cream or butter sauce. Meat and other protein foods Fatty cuts of meat. Ribs. Fried meat. Berniece Salines. Sausage. Bologna and other processed lunch meats.  Salami. Fatback. Hotdogs. Bratwurst. Salted nuts and seeds. Canned beans with added salt. Canned or smoked fish. Whole eggs or egg yolks. Chicken or [place] with skin. Dairy Whole or 2% milk, cream, and half-and-half. Whole or full-fat cream cheese. Whole-fat or sweetened yogurt. Full-fat cheese. Nondairy creamers. Whipped toppings. Processed cheese and cheese spreads. Fats and oils Butter. Stick margarine. Lard. Shortening. Ghee. Bacon fat. Tropical oils, such as coconut, palm kernel, or palm oil. Seasoning and other foods Salted popcorn and pretzels. Onion salt, garlic salt, seasoned salt, table salt, and sea salt. Worcestershire sauce. Tartar sauce. Barbecue sauce. Teriyaki sauce. Soy sauce, including reduced-sodium. Steak sauce. Canned and packaged gravies. Fish sauce. Oyster sauce. Cocktail sauce. Horseradish that you find on the shelf. Ketchup. Mustard. Meat flavorings and tenderizers. Bouillon cubes. Hot sauce and Tabasco sauce. Premade or packaged marinades. Premade or packaged taco seasonings. Relishes. Regular salad dressings. Where to find more information:  National Heart, Lung, and [place]: https://wilson-eaton.com/  American Heart Association: www.heart.org Summary  The DASH eating plan is a healthy eating plan that has been shown to reduce high blood pressure (hypertension). It may also reduce your risk for type 2 diabetes, heart disease, and stroke.  With the DASH eating plan, you should limit salt (sodium) intake to 2,300 mg a day. If  you have hypertension, you may need to reduce your sodium intake to 1,500 mg a day.  When on the DASH eating plan, aim to eat more fresh fruits and vegetables, whole grains, lean proteins, low-fat dairy, and heart-healthy fats.  Work with your health care provider or diet and nutrition specialist (dietitian) to adjust your eating plan to your individual calorie needs. This information is not intended to replace advice given to you by your health  care provider. Make sure you discuss any questions you have with your health care provider. Document Released: 12/20/2010 Document Revised: 12/25/2015 Document Reviewed: 12/25/2015 Elsevier Interactive Patient Education  2018 [place] Calories are units of energy. Your body needs a certain amount of calories from food to keep you going throughout the day. When you eat more calories than your body needs, your body stores the extra calories as fat. When you eat fewer calories than your body needs, your body burns fat to get the energy it needs. Calorie counting means keeping track of how many calories you eat and drink each day. Calorie counting can be helpful if you need to lose weight. If you make sure to eat fewer calories than your body needs, you should lose weight. Ask your health care provider what a healthy weight is for you. For calorie counting to work, you will need to eat the right number of calories in a day in order to lose a healthy amount of weight per week. A dietitian can help you determine how many calories you need in a day and will give you suggestions on how to reach your calorie goal.  A healthy amount of weight to lose per week is usually 1-2 lb (0.5-0.9 kg). This usually means that your daily calorie intake should be reduced by 500-750 calories.  Eating 1,200 - 1,500 calories per day can help most women lose weight.  Eating 1,500 - 1,800 calories per day can help most men lose weight.  What is my plan? My goal is to have __1500________ calories per day. If I have this many calories per day, I should lose around _____0.5_____ pounds per week. What do I need to know about calorie counting? In order to meet your daily calorie goal, you will need to:  Find out how many calories are in each food you would like to eat. Try to do this before you eat.  Decide how much of the food you plan to eat.  Write down what you ate and how many  calories it had. Doing this is called keeping a food log.  To successfully lose weight, it is important to balance calorie counting with a healthy lifestyle that includes regular activity. Aim for 150 minutes of moderate exercise (such as walking) or 75 minutes of vigorous exercise (such as running) each week. Where do I find calorie information?  The number of calories in a food can be found on a Nutrition Facts label. If a food does not have a Nutrition Facts label, try to look up the calories online or ask your dietitian for help. Remember that calories are listed per serving. If you choose to have more than one serving of a food, you will have to multiply the calories per serving by the amount of servings you plan to eat. For example, the label on a package of bread might say that a serving size is 1 slice and that there are 90 calories in a serving. If you eat  1 slice, you will have eaten 90 calories. If you eat 2 slices, you will have eaten 180 calories. How do I keep a food log? Immediately after each meal, record the following information in your food log:  What you ate. Don't forget to include toppings, sauces, and other extras on the food.  How much you ate. This can be measured in cups, ounces, or number of items.  How many calories each food and drink had.  The total number of calories in the meal.  Keep your food log near you, such as in a small notebook in your pocket, or use a mobile app or website. Some programs will calculate calories for you and show you how many calories you have left for the day to meet your goal. What are some calorie counting tips?  Use your calories on foods and drinks that will fill you up and not leave you hungry: ? Some examples of foods that fill you up are nuts and nut butters, vegetables, lean proteins, and high-fiber foods like whole grains. High-fiber foods are foods with more than 5 g fiber per serving. ? Drinks such as sodas, specialty coffee  drinks, alcohol, and juices have a lot of calories, yet do not fill you up.  Eat nutritious foods and avoid empty calories. Empty calories are calories you get from foods or beverages that do not have many vitamins or protein, such as candy, sweets, and soda. It is better to have a nutritious high-calorie food (such as an avocado) than a food with few nutrients (such as a bag of chips).  Know how many calories are in the foods you eat most often. This will help you calculate calorie counts faster.  Pay attention to calories in drinks. Low-calorie drinks include water and unsweetened drinks.  Pay attention to nutrition labels for "low fat" or "fat free" foods. These foods sometimes have the same amount of calories or more calories than the full fat versions. They also often have added sugar, starch, or salt, to make up for flavor that was removed with the fat.  Find a way of tracking calories that works for you. Get creative. Try different apps or programs if writing down calories does not work for you. What are some portion control tips?  Know how many calories are in a serving. This will help you know how many servings of a certain food you can have.  Use a measuring cup to measure serving sizes. You could also try weighing out portions on a kitchen scale. With time, you will be able to estimate serving sizes for some foods.  Take some time to put servings of different foods on your favorite plates, bowls, and cups so you know what a serving looks like.  Try not to eat straight from a bag or box. Doing this can lead to overeating. Put the amount you would like to eat in a cup or on a plate to make sure you are eating the right portion.  Use smaller plates, glasses, and bowls to prevent overeating.  Try not to multitask (for example, watch TV or use your computer) while eating. If it is time to eat, sit down at a table and enjoy your food. This will help you to know when you are full. It will  also help you to be aware of what you are eating and how much you are eating. What are tips for following this plan? Reading food labels  Check the calorie count compared  to the serving size. The serving size may be smaller than what you are used to eating.  Check the source of the calories. Make sure the food you are eating is high in vitamins and protein and low in saturated and trans fats. Shopping  Read nutrition labels while you shop. This will help you make healthy decisions before you decide to purchase your food.  Make a grocery list and stick to it. Cooking  Try to cook your favorite foods in a healthier way. For example, try baking instead of frying.  Use low-fat dairy products. Meal planning  Use more fruits and vegetables. Half of your plate should be fruits and vegetables.  Include lean proteins like poultry and fish. How do I count calories when eating out?  Ask for smaller portion sizes.  Consider sharing an entree and sides instead of getting your own entree.  If you get your own entree, eat only half. Ask for a box at the beginning of your meal and put the rest of your entree in it so you are not tempted to eat it.  If calories are listed on the menu, choose the lower calorie options.  Choose dishes that include vegetables, fruits, whole grains, low-fat dairy products, and lean protein.  Choose items that are boiled, broiled, grilled, or steamed. Stay away from items that are buttered, battered, fried, or served with cream sauce. Items labeled "crispy" are usually fried, unless stated otherwise.  Choose water, low-fat milk, unsweetened iced tea, or other drinks without added sugar. If you want an alcoholic beverage, choose a lower calorie option such as a glass of wine or light beer.  Ask for dressings, sauces, and syrups on the side. These are usually high in calories, so you should limit the amount you eat.  If you want a salad, choose a garden salad and ask  for grilled meats. Avoid extra toppings like bacon, cheese, or fried items. Ask for the dressing on the side, or ask for olive oil and vinegar or lemon to use as dressing.  Estimate how many servings of a food you are given. For example, a serving of cooked rice is  cup or about the size of half a baseball. Knowing serving sizes will help you be aware of how much food you are eating at restaurants. The list below tells you how big or small some common portion sizes are based on everyday objects: ? 1 oz-4 stacked dice. ? 3 oz-1 deck of cards. ? 1 tsp-1 die. ? 1 Tbsp- a ping-pong ball. ? 2 Tbsp-1 ping-pong ball. ?  cup- baseball. ? 1 cup-1 baseball. Summary  Calorie counting means keeping track of how many calories you eat and drink each day. If you eat fewer calories than your body needs, you should lose weight.  A healthy amount of weight to lose per week is usually 1-2 lb (0.5-0.9 kg). This usually means reducing your daily calorie intake by 500-750 calories.  The number of calories in a food can be found on a Nutrition Facts label. If a food does not have a Nutrition Facts label, try to look up the calories online or ask your dietitian for help.  Use your calories on foods and drinks that will fill you up, and not on foods and drinks that will leave you hungry.  Use smaller plates, glasses, and bowls to prevent overeating. This information is not intended to replace advice given to you by your health care provider. Make sure you discuss  any questions you have with your health care provider. Document Released: 12/31/2004 Document Revised: 12/01/2015 Document Reviewed: 12/01/2015 Elsevier Interactive Patient Education  [employer].

## 2017-03-31 NOTE — Progress Notes (Signed)
Subjective:    Patient ID: Rose Miller, female    DOB: [birth year], 62 y.o.   MRN: [id number]  Patient presents today for complete physical  Constipation  This is a recurrent problem. The current episode started 1 to 4 weeks ago. The problem has been waxing and waning since onset. The stool is described as pellet like. The patient is not on a high fiber diet. She does not exercise regularly. There has not been adequate water intake. Associated symptoms include abdominal pain, bloating and flatus. Pertinent negatives include no anorexia, back pain, diarrhea, difficulty urinating, fecal incontinence, fever, hematochezia, hemorrhoids, melena, nausea, rectal pain, vomiting or weight loss. Risk factors include obesity. She has tried laxatives for the symptoms. The treatment provided mild relief.     Home BP reading: 110-140/62-89. HR: 59-89.  Immunizations: (TDAP, Hep C screen, Pneumovax, Influenza, zoster)  Health Maintenance  Topic Date Due  . Flu Shot  04/14/2017*  . Mammogram  10/24/2018  . Pap Smear  03/30/2019  . Colon Cancer Screening  06/07/2019  . Tetanus Vaccine  12/18/2024  .  Hepatitis C: One time screening is recommended by Center for Disease Control  (CDC) for  adults born from [birth year] through 1967.   Completed  . HIV Screening  Completed  *Topic was postponed. The date shown is not the original due date.   Diet: regular. Has not been able to decrease calories.  Weight:  Wt Readings from Last 3 Encounters:  03/31/17 (!) 344 lb (156 kg)  10/31/16 (!) 339 lb (153.8 kg)  09/03/16 (!) 339 lb 6.4 oz (154 kg)   Exercise:none.  Fall Risk: Fall Risk  03/31/2017 03/29/2016  Falls in the past year? No No   Home Safety:home with family.  Depression/Suicide: Depression screen [facility] 2/9 03/31/2017 03/29/2016  Decreased Interest 0 0  Down, Depressed, Hopeless 0 0  PHQ - 2 Score 0 0   Pap Smear (every 70yrs for >21-29 without HPV, every 59yrs for >30-74yrs with HPV):up to  date.  Vision: up to date.  Dental: needed, does not have insurance.  Advanced Directive: Advanced Directives 06/07/2014  Does Patient Have a Medical Advance Directive? No  Would patient like information on creating a medical advance directive? -   Medications and allergies reviewed with patient and updated if appropriate.  Patient Active Problem List   Diagnosis Date Noted  . Class 3 severe obesity due to excess calories with serious comorbidity and body mass index (BMI) of 45.0 to 49.9 in adult ([place]) 03/31/2017  . Hyperglycemia 03/31/2017  . CKD (chronic kidney disease) stage 3, GFR 30-59 ml/min (H[facility]) 03/31/2017  . Hyperlipidemia LDL goal <70 06/28/2016  . Aortic valve stenosis 03/29/2016  . Periumbilical pain 90/24/0973  . Adhesive capsulitis of right shoulder 09/29/2014  . IT band syndrome 09/11/2014  . Right shoulder pain 09/11/2014  . Ventral hernia 05/01/2014  . Essential hypertension 03/17/2014    Current Outpatient Medications on File Prior to Visit  Medication Sig Dispense Refill  . amLODipine (NORVASC) 10 MG tablet Take 1 tablet (10 mg total) by mouth daily. 90 tablet 3  . atorvastatin (LIPITOR) 10 MG tablet Take 1 tablet (10 mg total) by mouth daily. 90 tablet 1  . Biotin 5000 MCG CAPS Take by mouth.    . Blood Pressure Monitoring (SPHYGMOMANOMETER) MISC 1 Units by Does not apply route daily. 1 each 0  . Cholecalciferol (EQL VITAMIN D3) 1000 units tablet Take 1,000 Units by mouth daily.    [name] Kitchen co-enzyme  Q-10 30 MG capsule Take 30 mg by mouth daily.    . Coenzyme Q10 (CO Q-10) 100 MG CAPS Take by mouth.    . Cyanocobalamin (B-12) 2500 MCG TABS Take by mouth.    . hydrALAZINE (APRESOLINE) 25 MG tablet Take 1 tablet (25 mg total) by mouth 2 (two) times daily. 270 tablet 3  . vitamin C (ASCORBIC ACID) 500 MG tablet Take 500 mg by mouth 2 (two) times daily.     No current facility-administered medications on file prior to visit.     Past Medical History:  Diagnosis  Date  . Allergy   . Arthritis   . Asthma   . Heart murmur   . Hypertension     Past Surgical History:  Procedure Laterality Date  . CESAREAN SECTION  1989  . TONSILLECTOMY AND ADENOIDECTOMY  1960    Social History   Socioeconomic History  . Marital status: Single    Spouse name: None  . Number of children: None  . Years of education: None  . Highest education level: None  Social Needs  . Financial resource strain: None  . Food insecurity - worry: None  . Food insecurity - inability: None  . Transportation needs - medical: None  . Transportation needs - non-medical: None  Occupational History  . None  Tobacco Use  . Smoking status: Former Research scientist (life sciences)  . Smokeless tobacco: Never Used  Substance and Sexual Activity  . Alcohol use: No    Alcohol/week: 0.0 oz  . Drug use: No  . Sexual activity: None  Other Topics Concern  . None  Social History Narrative  . None    Family History  Problem Relation Age of Onset  . Arthritis Mother   . Cancer Mother        pancreatic  . Hypertension Mother   . Cancer Father        prostate and colon  . Colon cancer Father         Review of Systems  Constitutional: Negative for fever, malaise/fatigue and weight loss.  HENT: Negative for congestion and sore throat.   Eyes:       Negative for visual changes  Respiratory: Negative for cough.   Cardiovascular: Negative for chest pain, palpitations and leg swelling.  Gastrointestinal: Positive for abdominal pain, bloating and flatus. Negative for anorexia, blood in stool, constipation, diarrhea, heartburn, hematochezia, hemorrhoids, melena, nausea, rectal pain and vomiting.  Genitourinary: Negative for difficulty urinating, dysuria, frequency and urgency.  Musculoskeletal: Negative for back pain, falls, joint pain and myalgias.  Skin: Negative for rash.  Neurological: Negative for dizziness, sensory change and headaches.  Endo/Heme/Allergies: Does not bruise/bleed easily.   Psychiatric/Behavioral: Negative for depression, substance abuse and suicidal ideas. The patient is not nervous/anxious.     Objective:   Vitals:   03/31/17 1311  BP: (!) 142/72  Pulse: 73  Temp: 98.2 F (36.8 C)  SpO2: 96%    Body mass index is 47.98 kg/m.   Physical Examination:  Physical Exam  Constitutional: She is oriented to person, place, and time. No distress.  HENT:  Right Ear: External ear normal.  Left Ear: External ear normal.  Nose: Nose normal.  Mouth/Throat: Oropharynx is clear and moist.  Neck: Normal range of motion. Neck supple.  Cardiovascular: Normal rate and regular rhythm.  Murmur heard. Pulmonary/Chest: Effort normal and breath sounds normal.  Abdominal: Soft. Bowel sounds are normal. She exhibits distension. There is no tenderness. There is no rebound and no  guarding.  Musculoskeletal: She exhibits edema. She exhibits no tenderness.  Lymphadenopathy:    She has no cervical adenopathy.  Neurological: She is alert and oriented to person, place, and time. Gait normal.  Skin: Skin is warm and dry.  Psychiatric: Memory, affect and judgment normal.  Vitals reviewed.   ASSESSMENT and PLAN:  Scotlyn was seen today for annual exam.  Diagnoses and all orders for this visit:  Encounter for preventative adult health care exam with abnormal findings  Class 3 severe obesity due to excess calories with serious comorbidity and body mass index (BMI) of 45.0 to 49.9 in adult ([place]) -     CBC -     TSH -     Hemoglobin A1c -     Amb ref to Medical Nutrition Therapy-MNT  Essential hypertension -     Comprehensive metabolic panel -     CBC -     Amb ref to Medical Nutrition Therapy-MNT  Hyperlipidemia LDL goal <70 -     Comprehensive metabolic panel -     Lipid panel -     CK  Hyperglycemia -     Hemoglobin A1c -     Amb ref to Medical Nutrition Therapy-MNT  CKD (chronic kidney disease) stage 3, GFR 30-59 ml/min (HCC) -     Comprehensive  metabolic panel  Constipation, unspecified constipation type -     sennosides-docusate sodium (SENOKOT-S) 8.6-50 MG tablet; Take 1 tablet by mouth at bedtime. -     omeprazole (PRILOSEC) 20 MG capsule; Take 1 capsule (20 mg total) by mouth daily before breakfast. -     saccharomyces boulardii (FLORASTOR) 250 MG capsule; Take 1 capsule (250 mg total) by mouth 2 (two) times daily.  Flatulence/gas pain/belching -     sennosides-docusate sodium (SENOKOT-S) 8.6-50 MG tablet; Take 1 tablet by mouth at bedtime. -     omeprazole (PRILOSEC) 20 MG capsule; Take 1 capsule (20 mg total) by mouth daily before breakfast. -     saccharomyces boulardii (FLORASTOR) 250 MG capsule; Take 1 capsule (250 mg total) by mouth 2 (two) times daily. -     US Abdomen Limited RUQ; Future  Elevated liver enzymes -     US Abdomen Limited RUQ; Future   No problem-specific Assessment & Plan notes found for this encounter.     Follow up: Return in about 6 months (around 10/01/2017) for HTN and hyperlipidemia.  Wilfred Lacy, NP

## 2017-04-01 ENCOUNTER — Telehealth: Payer: Self-pay | Admitting: Nurse Practitioner

## 2017-04-01 NOTE — Telephone Encounter (Unsigned)
Copied from [place] [id number]. Topic: Quick Communication - Lab Results >> Apr 01, 2017  9:52 AM Carolyn Stare wrote:  Office called pt about lab results . Triage line busy

## 2017-04-01 NOTE — Telephone Encounter (Signed)
CN-Plz advise if this is ok or not/plz advise/thx dmf  Copied from [place]. Topic: Referral - Question >> Apr 01, 2017 12:01 PM Scherrie Gerlach wrote: Reason for CRM: shelby with [facility] states the earliestt hey can do the pt ultrasound in 3/21. Wants to know if that is OK?  Call back if not, or they will just plan on thursday this week Baldo Ash ordered stat  [facility]  [phone]  option 1  // then option 5

## 2017-04-01 NOTE — Telephone Encounter (Signed)
See result note on labwork; pt. Was notified.

## 2017-04-02 ENCOUNTER — Encounter: Payer: Self-pay | Admitting: Nurse Practitioner

## 2017-04-02 ENCOUNTER — Other Ambulatory Visit: Payer: Self-pay | Admitting: Nurse Practitioner

## 2017-04-02 MED ORDER — LOSARTAN POTASSIUM-HCTZ 100-25 MG PO TABS: 1.0000 | ORAL_TABLET | Freq: Every day | ORAL | 0 refills | Status: DC

## 2017-04-02 NOTE — Progress Notes (Signed)
Pt stated [facility] ran out of this med, she request new rx sent to [facility]. Rose Miller agree to send in 90 tablet right now since the pt is out of this med but pt has to call Dr. [name] for more refills. Pt is aware to call Dr. [name] for more refills.

## 2017-04-02 NOTE — Telephone Encounter (Signed)
[place] for 04/03/2017

## 2017-04-02 NOTE — Telephone Encounter (Signed)
[facility] aware/thx dmf

## 2017-04-03 ENCOUNTER — Ambulatory Visit
Admission: RE | Admit: 2017-04-03 | Discharge: 2017-04-03 | Disposition: A | Discharge status: Home / Self Care | Payer: BLUE CROSS/BLUE SHIELD | Source: Ambulatory Visit | Attending: Nurse Practitioner | Admitting: Nurse Practitioner

## 2017-04-03 DIAGNOSIS — R140 Abdominal distension (gaseous): Secondary | ICD-10-CM

## 2017-04-03 DIAGNOSIS — R748 Abnormal levels of other serum enzymes: Secondary | ICD-10-CM

## 2017-04-04 ENCOUNTER — Telehealth: Payer: Self-pay | Admitting: Nurse Practitioner

## 2017-04-04 ENCOUNTER — Other Ambulatory Visit: Payer: Self-pay | Admitting: Nurse Practitioner

## 2017-04-04 DIAGNOSIS — R748 Abnormal levels of other serum enzymes: Secondary | ICD-10-CM

## 2017-04-04 NOTE — Telephone Encounter (Signed)
Copied from [place] [id number]. Topic: Quick Communication - See Telephone Encounter >> Apr 04, 2017 12:46 PM Bea Graff, NT wrote: CRM for notification. See Telephone encounter for: 04/04/17. Pt wanting a call from [facility] because she has questions regarding her lab results and her health summary.

## 2017-04-04 NOTE — Progress Notes (Signed)
Gallstones with no obstruction or inflammation. I do not think this is the cause of elevated liver enzymes. Avoid ETOH and acetaminophen. Return to lab on Monday for repeat hepatic panel. Order CT ABD/pelvis if persistently elevated

## 2017-04-04 NOTE — Telephone Encounter (Signed)
Spoke with the pt. She has an appt with [name] 04/07/2017.

## 2017-04-07 ENCOUNTER — Other Ambulatory Visit: Payer: BLUE CROSS/BLUE SHIELD

## 2017-04-07 ENCOUNTER — Ambulatory Visit: Payer: BLUE CROSS/BLUE SHIELD | Admitting: Nurse Practitioner

## 2017-04-07 ENCOUNTER — Encounter: Payer: Self-pay | Admitting: Nurse Practitioner

## 2017-04-07 VITALS — BP 156/84 | HR 73 | Temp 97.3°F | Ht 71.0 in | Wt 339.0 lb

## 2017-04-07 DIAGNOSIS — N183 Chronic kidney disease, stage 3 unspecified: Secondary | ICD-10-CM

## 2017-04-07 DIAGNOSIS — R748 Abnormal levels of other serum enzymes: Secondary | ICD-10-CM

## 2017-04-07 LAB — HEPATIC FUNCTION PANEL
ALBUMIN: 4 g/dL (ref 3.5–5.2)
ALT: 49 U/L — ABNORMAL HIGH (ref 0–35)
AST: 20 U/L (ref 0–37)
Alkaline Phosphatase: 87 U/L (ref 39–117)
Bilirubin, Direct: 0.1 mg/dL (ref 0.0–0.3)
Total Bilirubin: 0.5 mg/dL (ref 0.2–1.2)
Total Protein: 7.8 g/dL (ref 6.0–8.3)

## 2017-04-07 NOTE — Patient Instructions (Addendum)
Liver enyzymes have almost normalize: ALP and AST are normal. Mild elevation in ALT. No need for ABD CT at this time. Pending hep B and C panel. Continue to avoid ETOH and acetaminophen use. Maintain low fat/low carb diet.  Chronic Kidney Disease, Adult Chronic kidney disease (CKD) happens when the kidneys are damaged during a time of 3 or more months. The kidneys are two organs that do many important jobs in the body. These jobs include:  Removing wastes and extra fluids from the blood.  Making hormones that maintain the amount of fluid in your tissues and blood vessels.  Making sure that the body has the right amount of fluids and chemicals.  Most of the time, this condition does not go away, but it can usually be controlled. Steps must be taken to slow down the kidney damage or stop it from getting worse. Otherwise, the kidneys may stop working. Follow these instructions at home:  Follow your diet as told by your doctor. You may need to avoid alcohol, salty foods (sodium), and foods that are high in potassium, calcium, and protein.  Take over-the-counter and prescription medicines only as told by your doctor. Do not take any new medicines unless your doctor says you can do that. These include vitamins and minerals. ? Medicines and nutritional supplements can make kidney damage worse. ? Your doctor may need to change how much medicine you take.  Do not use any tobacco products. These include cigarettes, chewing tobacco, and e-cigarettes. If you need help quitting, ask your doctor.  Keep all follow-up visits as told by your doctor. This is important.  Check your blood pressure. Tell your doctor if there are changes to your blood pressure.  Get to a healthy weight. Stay at that weight. If you need help with this, ask your doctor.  Start or continue an exercise plan. Try to exercise at least 30 minutes a day, 5 days a week.  Stay up-to-date with your shots (immunizations) as told by  your doctor. Contact a doctor if:  Your symptoms get worse.  You have new symptoms. Get help right away if:  You have symptoms of end-stage kidney disease. These include: ? Headaches. ? Skin that is darker or lighter than normal. ? Numbness in your hands or feet. ? Easy bruising. ? Having hiccups often. ? Chest pain. ? Shortness of breath. ? Stopping of menstrual periods in women.  You have a fever.  You are making very little pee (urine).  You have pain or bleeding when you pee (urinate). This information is not intended to replace advice given to you by your health care provider. Make sure you discuss any questions you have with your health care provider. Document Released: 03/27/2009 Document Revised: 06/08/2015 Document Reviewed: 08/30/2011 Elsevier Interactive Patient Education  2017 [employer].

## 2017-04-07 NOTE — Progress Notes (Signed)
Subjective:  Patient ID: Rose Miller, female    DOB: 1955-05-24  Age: 62 y.o. MRN: [id number]  CC: Consult (consult for kidney disease. )   HPI Accompanied by Sister in law.  Rose Miller is concerned that she has a diagnosis of CKD in her chart and was never aware. She reports that she was aware her kidney number were elevated but she was not aware that meant she had chronic kidney disease. She want to know what that meant and how that is affected by her current medications.  Outpatient Medications Prior to Visit  Medication Sig Dispense Refill  . amLODipine (NORVASC) 10 MG tablet Take 1 tablet (10 mg total) by mouth daily. 90 tablet 3  . atorvastatin (LIPITOR) 10 MG tablet Take 1 tablet (10 mg total) by mouth daily. 90 tablet 1  . Biotin 5000 MCG CAPS Take by mouth.    . Blood Pressure Monitoring (SPHYGMOMANOMETER) MISC 1 Units by Does not apply route daily. 1 each 0  . Cholecalciferol (EQL VITAMIN D3) 1000 units tablet Take 1,000 Units by mouth daily.    . Coenzyme Q10 (CO Q-10) 100 MG CAPS Take by mouth.    . Cyanocobalamin (B-12) 2500 MCG TABS Take by mouth.    . hydrALAZINE (APRESOLINE) 25 MG tablet Take 1 tablet (25 mg total) by mouth 2 (two) times daily. 270 tablet 3  . losartan-hydrochlorothiazide (HYZAAR) 100-25 MG tablet Take 1 tablet by mouth daily. 90 tablet 0  . omeprazole (PRILOSEC) 20 MG capsule Take 1 capsule (20 mg total) by mouth daily before breakfast. 30 capsule 1  . saccharomyces boulardii (FLORASTOR) 250 MG capsule Take 1 capsule (250 mg total) by mouth 2 (two) times daily.    . sennosides-docusate sodium (SENOKOT-S) 8.6-50 MG tablet Take 1 tablet by mouth at bedtime. 30 tablet 1  . vitamin C (ASCORBIC ACID) 500 MG tablet Take 500 mg by mouth 2 (two) times daily.    [name] Kitchen co-enzyme Q-10 30 MG capsule Take 30 mg by mouth daily.     No facility-administered medications prior to visit.     ROS See HPI  Objective:  BP (!) 156/84 (BP Location: Left Arm, Patient  Position: Sitting, Cuff Size: Large)   Pulse 73   Temp (!) 97.3 F (36.3 C) (Oral)   Ht 5\' 11"  (1.803 m)   Wt (!) 339 lb (153.8 kg)   SpO2 94%   BMI 47.28 kg/m   BP Readings from Last 3 Encounters:  04/07/17 (!) 156/84  03/31/17 (!) 142/72  02/20/17 122/70    Wt Readings from Last 3 Encounters:  04/07/17 (!) 339 lb (153.8 kg)  03/31/17 (!) 344 lb (156 kg)  10/31/16 (!) 339 lb (153.8 kg)    Physical Exam  Constitutional: She is oriented to person, place, and time.  Neurological: She is alert and oriented to person, place, and time.  Vitals reviewed.  Lab Results  Component Value Date   WBC 4.3 03/31/2017   HGB 11.6 (L) 03/31/2017   HCT 34.7 (L) 03/31/2017   PLT 233.0 03/31/2017   GLUCOSE 94 03/31/2017   CHOL 142 03/31/2017   TRIG 75.0 03/31/2017   HDL 59.70 03/31/2017   LDLCALC 67 03/31/2017   ALT 49 (H) 04/07/2017   AST 20 04/07/2017   NA 143 03/31/2017   K 3.7 03/31/2017   CL 104 03/31/2017   CREATININE 1.22 (H) 03/31/2017   BUN 14 03/31/2017   CO2 30 03/31/2017   TSH 0.93 03/31/2017   HGBA1C 6.7 (  H) 03/31/2017    US Abdomen Limited Ruq  Result Date: 04/03/2017 CLINICAL DATA:  Flatulence/gas pain/belching. Elevated liver enzymes. EXAM: ULTRASOUND ABDOMEN LIMITED RIGHT UPPER QUADRANT COMPARISON:  CT abdomen and pelvis 04/09/2016. FINDINGS: Gallbladder: Multiple mobile gallstones are evident. Wall thickness is within normal limits a 1.7 mm. The largest measured stone is 9 mm. Common bile duct: Diameter: 8.8 mm, within normal limits. Liver: Liver parenchyma is somewhat hyperechoic. There is some heterogeneity without a discrete lesion. Portal vein is patent on color Doppler imaging with normal direction of blood flow towards the liver. IMPRESSION: 1. Cholelithiasis without cholecystitis. 2. Hepatic steatosis without focal lesions. Electronically Signed   By: San Morelle M.D.   On: 04/03/2017 08:59    Assessment & Plan:  I advised Ms. Puller about kidney  disease pathology, different stages, and effect of her current medications, and possible life style changes to minimize progression.I informed her that labs are monitor every 3-10months to make monitor disease progression. Medication changes will be made if function declines.  Rose Miller was seen today for consult.  Diagnoses and all orders for this visit:  CKD (chronic kidney disease) stage 3, GFR 30-59 ml/min (HCC)  Elevated liver enzymes -     Hepatitis C Antibody -     Hep B Core Ab W/Reflex -     Hepatic function panel   I have discontinued Rose Miller's co-enzyme Q-10. I am also having her maintain her amLODipine, Sphygmomanometer, Cholecalciferol, vitamin C, Biotin, Co Q-10, B-12, atorvastatin, hydrALAZINE, sennosides-docusate sodium, omeprazole, saccharomyces boulardii, and losartan-hydrochlorothiazide.  No orders of the defined types were placed in this encounter.   Follow-up: Return if symptoms worsen or fail to improve.  Wilfred Lacy, NP

## 2017-04-08 LAB — HEPATITIS C ANTIBODY
Hepatitis C Ab: NONREACTIVE
SIGNAL TO CUT-OFF: 0.04 (ref <1.00)

## 2017-04-09 LAB — HEPATITIS B CORE AB W/REFLEX: Hep B Core Total Ab: POSITIVE — AB

## 2017-04-09 LAB — HBCIGM: Hep B C IgM: NEGATIVE

## 2017-04-28 ENCOUNTER — Ambulatory Visit: Payer: BLUE CROSS/BLUE SHIELD | Admitting: Registered"

## 2017-04-30 ENCOUNTER — Telehealth: Payer: Self-pay

## 2017-04-30 NOTE — Telephone Encounter (Signed)
Pt states she has a cyst under her arm and is becoming concerned. I told her to make an appointment at her earliest convinience with [facility]. She stated she would call to get appointment. TLGCopied from [place] [phone]. Topic: Inquiry >> Apr 30, 2017 12:51 PM Conception Chancy, NT wrote: Reason for CRM: patient would like Louretta Parma nurse to give her a call. Patient would not discuss any information with me.

## 2017-05-01 ENCOUNTER — Encounter: Payer: Self-pay | Admitting: Registered"

## 2017-05-01 ENCOUNTER — Encounter: Payer: Self-pay | Admitting: Nurse Practitioner

## 2017-05-01 ENCOUNTER — Ambulatory Visit: Payer: BLUE CROSS/BLUE SHIELD | Admitting: Nurse Practitioner

## 2017-05-01 ENCOUNTER — Encounter: Payer: BLUE CROSS/BLUE SHIELD | Attending: Nurse Practitioner | Admitting: Registered"

## 2017-05-01 VITALS — BP 132/68 | HR 78 | Temp 98.0°F | Ht 71.0 in | Wt 345.4 lb

## 2017-05-01 DIAGNOSIS — R739 Hyperglycemia, unspecified: Secondary | ICD-10-CM | POA: Insufficient documentation

## 2017-05-01 DIAGNOSIS — Z713 Dietary counseling and surveillance: Secondary | ICD-10-CM | POA: Diagnosis not present

## 2017-05-01 DIAGNOSIS — Z6841 Body Mass Index (BMI) 40.0 and over, adult: Secondary | ICD-10-CM | POA: Insufficient documentation

## 2017-05-01 DIAGNOSIS — L02439 Carbuncle of limb, unspecified: Secondary | ICD-10-CM | POA: Diagnosis not present

## 2017-05-01 DIAGNOSIS — I10 Essential (primary) hypertension: Secondary | ICD-10-CM | POA: Diagnosis not present

## 2017-05-01 MED ORDER — DOXYCYCLINE HYCLATE 100 MG PO TABS: 100.0000 mg | ORAL_TABLET | Freq: Two times a day (BID) | ORAL | 0 refills | Status: AC

## 2017-05-01 NOTE — Progress Notes (Signed)
Subjective:  Patient ID: Rose Miller, female    DOB: 1955-09-21  Age: 62 y.o. MRN: [id number]  CC: Cyst (under arm cyst is becoming painful in the last week, has gotten bigger pt states.)   Rash  This is a new problem. The current episode started in the past 7 days. The problem has been gradually worsening since onset. The affected locations include the right axilla. The rash is characterized by pain, redness and swelling. It is unknown if there was an exposure to a precipitant. Pertinent negatives include no anorexia, facial edema, fatigue, fever, joint pain or shortness of breath. Past treatments include nothing.    Outpatient Medications Prior to Visit  Medication Sig Dispense Refill  . amLODipine (NORVASC) 10 MG tablet Take 1 tablet (10 mg total) by mouth daily. 90 tablet 3  . atorvastatin (LIPITOR) 10 MG tablet Take 1 tablet (10 mg total) by mouth daily. 90 tablet 1  . Biotin 5000 MCG CAPS Take by mouth.    . Blood Pressure Monitoring (SPHYGMOMANOMETER) MISC 1 Units by Does not apply route daily. 1 each 0  . Cholecalciferol (EQL VITAMIN D3) 1000 units tablet Take 1,000 Units by mouth daily.    . Coenzyme Q10 (CO Q-10) 100 MG CAPS Take by mouth.    . Cyanocobalamin (B-12) 2500 MCG TABS Take by mouth.    . hydrALAZINE (APRESOLINE) 25 MG tablet Take 1 tablet (25 mg total) by mouth 2 (two) times daily. 270 tablet 3  . losartan-hydrochlorothiazide (HYZAAR) 100-25 MG tablet Take 1 tablet by mouth daily. 90 tablet 0  . omeprazole (PRILOSEC) 20 MG capsule Take 1 capsule (20 mg total) by mouth daily before breakfast. 30 capsule 1  . saccharomyces boulardii (FLORASTOR) 250 MG capsule Take 1 capsule (250 mg total) by mouth 2 (two) times daily.    . sennosides-docusate sodium (SENOKOT-S) 8.6-50 MG tablet Take 1 tablet by mouth at bedtime. 30 tablet 1  . vitamin C (ASCORBIC ACID) 500 MG tablet Take 500 mg by mouth 2 (two) times daily.     No facility-administered medications prior to visit.       ROS See HPI  Objective:  BP 132/68 (BP Location: Right Arm, Patient Position: Sitting, Cuff Size: Large)   Pulse 78   Temp 98 F (36.7 C) (Oral)   Ht 5\' 11"  (1.803 m)   Wt (!) 345 lb 6.4 oz (156.7 kg)   SpO2 97%   BMI 48.17 kg/m   BP Readings from Last 3 Encounters:  05/01/17 132/68  04/07/17 (!) 156/84  03/31/17 (!) 142/72    Wt Readings from Last 3 Encounters:  05/01/17 (!) 345 lb 6.4 oz (156.7 kg)  04/07/17 (!) 339 lb (153.8 kg)  03/31/17 (!) 344 lb (156 kg)    Physical Exam  Constitutional: She is oriented to person, place, and time. No distress.  Musculoskeletal: Normal range of motion.  Neurological: She is alert and oriented to person, place, and time.  Skin: Rash noted. Rash is pustular. There is erythema.  Procedure note:  Incision and Drainage of an Abscess   Indication : a localized collection of pus that is tender and not spontaneously resolving.   Risks including unsuccessful procedure , possible need for a repeat procedure due to pus accumulation, scar formation, and others as well as benefits were explained to the patient in detail. Verbal consent was obtained/signed.    The patient was placed in a supine position. The area of an abscess on R. Axilla fold was  prepped with povidone-iodine and draped in a sterile fashion. Blister incised, scab removed.no purulent material was expressed. The cavity was cleaned with saline.  The wound was dressed with a large bandaid.  Tolerated well. Complications: None.   Wound instructions provided.    Please contact us if you notice a recollection of pus in the abscess fever and chills increased pain redness red streaks near the abscess increased swelling in the area.   Vitals reviewed.   Lab Results  Component Value Date   WBC 4.3 03/31/2017   HGB 11.6 (L) 03/31/2017   HCT 34.7 (L) 03/31/2017   PLT 233.0 03/31/2017   GLUCOSE 94 03/31/2017   CHOL 142 03/31/2017   TRIG 75.0 03/31/2017   HDL 59.70  03/31/2017   LDLCALC 67 03/31/2017   ALT 49 (H) 04/07/2017   AST 20 04/07/2017   NA 143 03/31/2017   K 3.7 03/31/2017   CL 104 03/31/2017   CREATININE 1.22 (H) 03/31/2017   BUN 14 03/31/2017   CO2 30 03/31/2017   TSH 0.93 03/31/2017   HGBA1C 6.7 (H) 03/31/2017    US Abdomen Limited Ruq  Result Date: 04/03/2017 CLINICAL DATA:  Flatulence/gas pain/belching. Elevated liver enzymes. EXAM: ULTRASOUND ABDOMEN LIMITED RIGHT UPPER QUADRANT COMPARISON:  CT abdomen and pelvis 04/09/2016. FINDINGS: Gallbladder: Multiple mobile gallstones are evident. Wall thickness is within normal limits a 1.7 mm. The largest measured stone is 9 mm. Common bile duct: Diameter: 8.8 mm, within normal limits. Liver: Liver parenchyma is somewhat hyperechoic. There is some heterogeneity without a discrete lesion. Portal vein is patent on color Doppler imaging with normal direction of blood flow towards the liver. IMPRESSION: 1. Cholelithiasis without cholecystitis. 2. Hepatic steatosis without focal lesions. Electronically Signed   By: San Morelle M.D.   On: 04/03/2017 08:59    Assessment & Plan:   Rose Miller was seen today for cyst.  Diagnoses and all orders for this visit:  Carbuncle of axillary fold -     doxycycline (VIBRA-TABS) 100 MG tablet; Take 1 tablet (100 mg total) by mouth 2 (two) times daily for 7 days. -     Cancel: Wound culture   I am having Rose Miller start on doxycycline. I am also having her maintain her amLODipine, Sphygmomanometer, Cholecalciferol, vitamin C, Biotin, Co Q-10, B-12, atorvastatin, hydrALAZINE, sennosides-docusate sodium, omeprazole, saccharomyces boulardii, and losartan-hydrochlorothiazide.  Meds ordered this encounter  Medications  . doxycycline (VIBRA-TABS) 100 MG tablet    Sig: Take 1 tablet (100 mg total) by mouth 2 (two) times daily for 7 days.    Dispense:  14 tablet    Refill:  0    Order Specific Question:   Supervising Provider    Answer:   Lucille Passy  [[id number]]   Follow-up: Return if symptoms worsen or fail to improve.  Wilfred Lacy, NP

## 2017-05-01 NOTE — Patient Instructions (Addendum)
Apply warm compress 2-3 times a day (68mins at a time). Change dressing 2-3times a day as needed. Clean with warm water and antibacterial soap.  Return to office if no improvement inb5days.   Skin Abscess A skin abscess is an infected area on or under your skin that contains pus and other material. An abscess can happen almost anywhere on your body. Some abscesses break open (rupture) on their own. Most continue to get worse unless they are treated. The infection can spread deeper into the body and into your blood, which can make you feel sick. Treatment usually involves draining the abscess. Follow these instructions at home: Abscess Care  If you have an abscess that has not drained, place a warm, clean, wet washcloth over the abscess several times a day. Do this as told by your doctor.  Follow instructions from your doctor about how to take care of your abscess. Make sure you: ? Cover the abscess with a bandage (dressing). ? Change your bandage or gauze as told by your doctor. ? Wash your hands with soap and water before you change the bandage or gauze. If you cannot use soap and water, use hand sanitizer.  Check your abscess every day for signs that the infection is getting worse. Check for: ? More redness, swelling, or pain. ? More fluid or blood. ? Warmth. ? More pus or a bad smell. Medicines   Take over-the-counter and prescription medicines only as told by your doctor.  If you were prescribed an antibiotic medicine, take it as told by your doctor. Do not stop taking the antibiotic even if you start to feel better. General instructions  To avoid spreading the infection: ? Do not share personal care items, towels, or hot tubs with others. ? Avoid making skin-to-skin contact with other people.  Keep all follow-up visits as told by your doctor. This is important. Contact a doctor if:  You have more redness, swelling, or pain around your abscess.  You have more fluid or  blood coming from your abscess.  Your abscess feels warm when you touch it.  You have more pus or a bad smell coming from your abscess.  You have a fever.  Your muscles ache.  You have chills.  You feel sick. Get help right away if:  You have very bad (severe) pain.  You see red streaks on your skin spreading away from the abscess. This information is not intended to replace advice given to you by your health care provider. Make sure you discuss any questions you have with your health care provider. Document Released: 06/19/2007 Document Revised: 08/27/2015 Document Reviewed: 11/09/2014 Elsevier Interactive Patient Education  [employer].

## 2017-05-01 NOTE — Patient Instructions (Signed)
Aim to eat regularly scheduled meals Aim to eat balanced meals and snacks and include protein when having carbs. Consider increasing your physical activity, may also help to keep you regular Consider making your sleep a priority to help with overall health and controlling blood sugar Aim to eat vegetables daily

## 2017-05-01 NOTE — Progress Notes (Signed)
Medical Nutrition Therapy:  Appt start time: 0810 end time:  0900.  This patient is accompanied in the office by her sibling.  Assessment:  Primary concerns today: Patient is here to learn how to eat better to control blood sugar. Per chart labs 03/31/17 A1c is 6.7%. Per chart patient's MD has not added diabetes diagnosed to her active problem list.  Pertinent active problems noted from MD chart notes: CKD stage 3; Hyperlipidemia LDL goal <70; essential hypertension  Patient states she used to drink OJ but now only on occasion. Patient appeared distressed by the 24-hour dietary recall questions and said she does not have a set routine for what or when she eats. RD changed direction of questioning and asked patient to think about foods that are in her refrigerator and cupboard. Patient reports tuna fish, vegetables, frozen fish sticks, salmon (eats weekly) chicken, shrimp, almond milk for cereal, chobani flip yogurt, cheese which she only eats small amounts on eggs or crackers. Patient states she has fruit out on the counter and has a banana every day with cereal or a snack later if not with breakfast. Patient states she also enjoys cashews and peanuts. Patient states she uses olive oil and grape seed oil, and bakes her meat and fish. Patient reports she enjoys vegetables but doesn't eat them every day.  Patient states her son is trying to help her and has told her not to eat bread, rice or potatoes. Patient states she uses tortillas in place of bread, but she used to enjoy whole grain bread for her sandwiches.   Patient states she has recently been having some issues with constipation, just started senokot, which has resolved the problem. Patient states her MD also recommended Florastor probiotic, but she has not picked it up yet.  Patient states she understands the importance of physical activity but does not do enough, although she states she is walking a little bit more.  Patient reports she  usually sleeps 7-8 hrs and has plenty of energy, since constipation has resolved. Patient states sometimes she will stay up all night, often because she is wrapped up on what she is doing on the computer.  Preferred Learning Style:   No preference indicated   Learning Readiness:   Ready  MEDICATIONS: reviewed, doesn't always take all the vitamins in the list   DIETARY INTAKE:  24-hr recall: (RD didn't complete, caused patient distress) B ( AM): cereal OR eggs, grits Snk ( AM): none OR cashews OR crackers L ( PM): Snk ( PM):  D ( PM):  Snk ( PM):  Beverages: water  Usual physical activity: ADLs  Estimated energy needs: 1800 calories 180 g carbohydrates 126 g protein 64 g fat  Progress Towards Goal(s):  In progress.   Nutritional Diagnosis:  NB-1.1 Food and nutrition-related knowledge deficit As related to lack of knowledge and importance of structured balanced meals.  As evidenced by dietary recall and patient stated misconceptions about forbidden foods..    Intervention:  Nutrition Education. Discussed balanced eating and the importance of including whole grains, lean protein, and plenty of vegetables and some fruits. Discussed the role of structured sleep and factors affecting sleep. Discussed glucose and insulin roles in BG control. Discussed [employer] diagnosing criteria for diabetes.  Teaching Method Utilized:  Visual Auditory  Handouts given during visit include: AND Diabetes Book A1c handout Portion Plate Sleep Hygiene  Barriers to learning/adherence to lifestyle change: none  Demonstrated degree of understanding via:  Teach Back  Monitoring/Evaluation:  Dietary intake, exercise, and body weight prn.

## 2017-05-08 ENCOUNTER — Other Ambulatory Visit: Payer: Self-pay | Admitting: Nurse Practitioner

## 2017-05-08 ENCOUNTER — Ambulatory Visit
Admission: RE | Admit: 2017-05-08 | Discharge: 2017-05-08 | Disposition: A | Discharge status: Home / Self Care | Payer: BLUE CROSS/BLUE SHIELD | Source: Ambulatory Visit | Attending: Nurse Practitioner | Admitting: Nurse Practitioner

## 2017-05-08 DIAGNOSIS — N63 Unspecified lump in unspecified breast: Secondary | ICD-10-CM

## 2017-05-19 ENCOUNTER — Telehealth: Payer: Self-pay | Admitting: Nurse Practitioner

## 2017-05-19 DIAGNOSIS — E785 Hyperlipidemia, unspecified: Secondary | ICD-10-CM

## 2017-05-19 NOTE — Telephone Encounter (Signed)
Copied from [place] [id number]. Topic: Quick Communication - Rx Refill/Question >> May 19, 2017  2:20 PM Yvette Rack wrote: Medication: atorvastatin (LIPITOR) 10 MG tablet Has the patient contacted their pharmacy? Yes.   (Agent: If no, request that the patient contact the pharmacy for the refill.) Preferred Pharmacy (with phone number or street name):   [facility], [place]. [phone] (Phone) [phone] (Fax)     Agent: Please be advised that RX refills may take up to 3 business days. We ask that you follow-up with your pharmacy.

## 2017-05-20 MED ORDER — ATORVASTATIN CALCIUM 10 MG PO TABS: 10.0000 mg | ORAL_TABLET | Freq: Every day | ORAL | 1 refills | Status: DC

## 2017-05-20 NOTE — Telephone Encounter (Signed)
Request for Lipitor refill; last refill 10/31/16; # 90; Refill x 1 Last office visit for CPE 03/31/17 Pharmacy:  [facility] [address].   Lipitor refilled per protocol.

## 2017-07-02 ENCOUNTER — Other Ambulatory Visit: Payer: Self-pay | Admitting: Nurse Practitioner

## 2017-07-03 ENCOUNTER — Other Ambulatory Visit: Payer: Self-pay | Admitting: Interventional Cardiology

## 2017-07-03 MED ORDER — LOSARTAN POTASSIUM-HCTZ 100-25 MG PO TABS: 1.0000 | ORAL_TABLET | Freq: Every day | ORAL | 0 refills | Status: DC

## 2017-08-21 ENCOUNTER — Other Ambulatory Visit: Payer: Self-pay | Admitting: Nurse Practitioner

## 2017-08-21 DIAGNOSIS — I10 Essential (primary) hypertension: Secondary | ICD-10-CM

## 2017-10-01 ENCOUNTER — Ambulatory Visit: Payer: BLUE CROSS/BLUE SHIELD | Admitting: Nurse Practitioner

## 2017-10-02 ENCOUNTER — Ambulatory Visit: Payer: BLUE CROSS/BLUE SHIELD | Admitting: Nurse Practitioner

## 2017-10-02 ENCOUNTER — Encounter: Payer: Self-pay | Admitting: Nurse Practitioner

## 2017-10-02 ENCOUNTER — Other Ambulatory Visit: Payer: Self-pay | Admitting: Interventional Cardiology

## 2017-10-02 VITALS — BP 130/78 | HR 70 | Temp 98.3°F | Ht 71.0 in | Wt 337.0 lb

## 2017-10-02 DIAGNOSIS — N183 Chronic kidney disease, stage 3 unspecified: Secondary | ICD-10-CM

## 2017-10-02 DIAGNOSIS — I10 Essential (primary) hypertension: Secondary | ICD-10-CM | POA: Diagnosis not present

## 2017-10-02 DIAGNOSIS — R739 Hyperglycemia, unspecified: Secondary | ICD-10-CM | POA: Diagnosis not present

## 2017-10-02 DIAGNOSIS — K219 Gastro-esophageal reflux disease without esophagitis: Secondary | ICD-10-CM

## 2017-10-02 DIAGNOSIS — E785 Hyperlipidemia, unspecified: Secondary | ICD-10-CM

## 2017-10-02 LAB — HEPATIC FUNCTION PANEL
ALBUMIN: 4.2 g/dL (ref 3.5–5.2)
ALT: 14 U/L (ref 0–35)
AST: 15 U/L (ref 0–37)
Alkaline Phosphatase: 66 U/L (ref 39–117)
Bilirubin, Direct: 0.1 mg/dL (ref 0.0–0.3)
TOTAL PROTEIN: 8 g/dL (ref 6.0–8.3)
Total Bilirubin: 0.5 mg/dL (ref 0.2–1.2)

## 2017-10-02 LAB — BASIC METABOLIC PANEL
BUN: 22 mg/dL (ref 6–23)
CHLORIDE: 101 meq/L (ref 96–112)
CO2: 30 mEq/L (ref 19–32)
Calcium: 9.4 mg/dL (ref 8.4–10.5)
Creatinine, Ser: 1.25 mg/dL — ABNORMAL HIGH (ref 0.40–1.20)
GFR: 55.53 mL/min — AB (ref >60.00)
Glucose, Bld: 117 mg/dL — ABNORMAL HIGH (ref 70–99)
POTASSIUM: 3.5 meq/L (ref 3.5–5.1)
SODIUM: 140 meq/L (ref 135–145)

## 2017-10-02 LAB — HEMOGLOBIN A1C: Hgb A1c MFr Bld: 6.7 % — ABNORMAL HIGH (ref 4.6–6.5)

## 2017-10-02 NOTE — Progress Notes (Signed)
Subjective:  Patient ID: Rose Miller, female    DOB: 1955-01-19  Age: 62 y.o. MRN: [id number]  CC: Follow-up (6 mo fu/)  HPI  HTN: At goal with Hyzaar, amlodipine, and hydralazine. Home BP readings: 120s-130s/60s-70s HR: 50-82 (asymptomatic with bradycardia) BP Readings from Last 3 Encounters:  10/02/17 130/78  05/01/17 132/68  04/07/17 (!) 156/84  appt with Dr. [name] (cardiology) 10/16/2017. Echocardiogram last done 2018: normal Ef, LVh, moderate aortic stenosis. ECG last done 2018: S. Bradycardia.  Obesity: BMI 47. Lost 8lbs Made changes to diet (low carb and small portion Had appt with nutritionist. Walking around home. Wt Readings from Last 3 Encounters:  10/02/17 (!) 337 lb (152.9 kg)  05/01/17 (!) 345 lb 6.4 oz (156.7 kg)  04/07/17 (!) 339 lb (153.8 kg)    Reviewed past Medical, Social and Family history today.  Outpatient Medications Prior to Visit  Medication Sig Dispense Refill  . amLODipine (NORVASC) 10 MG tablet TAKE 1 TABLET BY MOUTH ONCE DAILY 90 tablet 3  . Biotin 5000 MCG CAPS Take by mouth.    . Cholecalciferol (EQL VITAMIN D3) 1000 units tablet Take 1,000 Units by mouth daily.    . Coenzyme Q10 (CO Q-10) 100 MG CAPS Take by mouth.    . Cyanocobalamin (B-12) 2500 MCG TABS Take by mouth.    . hydrALAZINE (APRESOLINE) 25 MG tablet Take 1 tablet (25 mg total) by mouth 2 (two) times daily. 270 tablet 3  . losartan-hydrochlorothiazide (HYZAAR) 100-25 MG tablet Take 1 tablet by mouth daily. Please keep upcoming appt with Dr. [name] in October for future refills. Thank you 90 tablet 0  . sennosides-docusate sodium (SENOKOT-S) 8.6-50 MG tablet Take 1 tablet by mouth at bedtime. 30 tablet 1  . vitamin C (ASCORBIC ACID) 500 MG tablet Take 500 mg by mouth 2 (two) times daily.    [name] Kitchen atorvastatin (LIPITOR) 10 MG tablet Take 1 tablet (10 mg total) by mouth daily. 90 tablet 1  . omeprazole (PRILOSEC) 20 MG capsule Take 1 capsule (20 mg total) by mouth daily before  breakfast. 30 capsule 1  . Blood Pressure Monitoring (SPHYGMOMANOMETER) MISC 1 Units by Does not apply route daily. (Patient not taking: Reported on 10/02/2017) 1 each 0  . saccharomyces boulardii (FLORASTOR) 250 MG capsule Take 1 capsule (250 mg total) by mouth 2 (two) times daily. (Patient not taking: Reported on 10/02/2017)     No facility-administered medications prior to visit.    ROS Review of Systems  Respiratory: Negative for cough, sputum production and shortness of breath.   Cardiovascular: Negative for chest pain, palpitations, orthopnea, leg swelling and PND.  Neurological: Negative for dizziness.  Endo/Heme/Allergies: Negative for polydipsia.  Psychiatric/Behavioral: The patient does not have insomnia.    Objective:  BP 130/78   Pulse 70   Temp 98.3 F (36.8 C) (Oral)   Ht 5\' 11"  (1.803 m)   Wt (!) 337 lb (152.9 kg)   SpO2 97%   BMI 47.00 kg/m   BP Readings from Last 3 Encounters:  10/02/17 130/78  05/01/17 132/68  04/07/17 (!) 156/84    Wt Readings from Last 3 Encounters:  10/02/17 (!) 337 lb (152.9 kg)  05/01/17 (!) 345 lb 6.4 oz (156.7 kg)  04/07/17 (!) 339 lb (153.8 kg)    Physical Exam  Constitutional: She is oriented to person, place, and time. She appears well-developed and well-nourished.  Neck: No JVD present.  Cardiovascular: Normal rate and regular rhythm.  Murmur heard. Pulmonary/Chest: Effort normal and  breath sounds normal.  Musculoskeletal: She exhibits no edema.  Neurological: She is alert and oriented to person, place, and time.  Psychiatric: She has a normal mood and affect. Her behavior is normal. Thought content normal.    Lab Results  Component Value Date   WBC 4.3 03/31/2017   HGB 11.6 (L) 03/31/2017   HCT 34.7 (L) 03/31/2017   PLT 233.0 03/31/2017   GLUCOSE 117 (H) 10/02/2017   CHOL 142 03/31/2017   TRIG 75.0 03/31/2017   HDL 59.70 03/31/2017   LDLCALC 67 03/31/2017   ALT 14 10/02/2017   AST 15 10/02/2017   NA 140  10/02/2017   K 3.5 10/02/2017   CL 101 10/02/2017   CREATININE 1.25 (H) 10/02/2017   BUN 22 10/02/2017   CO2 30 10/02/2017   TSH 0.93 03/31/2017   HGBA1C 6.7 (H) 10/02/2017    US Breast Webb Axilla  Result Date: 05/08/2017 CLINICAL DATA:  Short-term follow-up for a probably benign lesion in the left breast, initially evaluated in October 2018. EXAM: DIGITAL DIAGNOSTIC LEFT MAMMOGRAM WITH CAD AND TOMO ULTRASOUND LEFT BREAST COMPARISON:  Previous exam(s). ACR Breast Density Category b: There are scattered areas of fibroglandular density. FINDINGS: The small, relatively lucent, superficial lobulated mass in the inferior left breast is without change. There are no new masses, no areas of architectural distortion and no suspicious calcifications. Mammographic images were processed with CAD. Targeted ultrasound is performed, showing a superficial cystic mass in the left breast, involving the deep dermis, lying at 8 o'clock, 2 cm the nipple, measuring 6 x 3 x 5 mm, without change from the prior study. IMPRESSION: 1. Probably benign small superficial mass in the left breast at 8 o'clock, most likely a complicated cyst, unchanged from the study performed 6 months previously. Additional short-term follow-up recommended. 2. No new abnormalities. RECOMMENDATION: Diagnostic mammography and left breast ultrasound in 6 months. I have discussed the findings and recommendations with the patient. Results were also provided in writing at the conclusion of the visit. If applicable, a reminder letter will be sent to the patient regarding the next appointment. BI-RADS CATEGORY  3: Probably benign. Electronically Signed   By: Lajean Manes M.D.   On: 05/08/2017 13:28   Mm Diag Breast Tomo Uni Left  Result Date: 05/08/2017 CLINICAL DATA:  Short-term follow-up for a probably benign lesion in the left breast, initially evaluated in October 2018. EXAM: DIGITAL DIAGNOSTIC LEFT MAMMOGRAM WITH CAD AND TOMO ULTRASOUND  LEFT BREAST COMPARISON:  Previous exam(s). ACR Breast Density Category b: There are scattered areas of fibroglandular density. FINDINGS: The small, relatively lucent, superficial lobulated mass in the inferior left breast is without change. There are no new masses, no areas of architectural distortion and no suspicious calcifications. Mammographic images were processed with CAD. Targeted ultrasound is performed, showing a superficial cystic mass in the left breast, involving the deep dermis, lying at 8 o'clock, 2 cm the nipple, measuring 6 x 3 x 5 mm, without change from the prior study. IMPRESSION: 1. Probably benign small superficial mass in the left breast at 8 o'clock, most likely a complicated cyst, unchanged from the study performed 6 months previously. Additional short-term follow-up recommended. 2. No new abnormalities. RECOMMENDATION: Diagnostic mammography and left breast ultrasound in 6 months. I have discussed the findings and recommendations with the patient. Results were also provided in writing at the conclusion of the visit. If applicable, a reminder letter will be sent to the patient regarding the next appointment.  BI-RADS CATEGORY  3: Probably benign. Electronically Signed   By: Lajean Manes M.D.   On: 05/08/2017 13:28    Assessment & Plan:   Destina was seen today for follow-up.  Diagnoses and all orders for this visit:  Essential hypertension -     Basic metabolic panel  CKD (chronic kidney disease) stage 3, GFR 30-59 ml/min (HCC) -     Basic metabolic panel  Hyperglycemia -     Hemoglobin A1c  Hyperlipidemia LDL goal <70 -     Hepatic function panel -     atorvastatin (LIPITOR) 10 MG tablet; Take 1 tablet (10 mg total) by mouth daily.  Gastroesophageal reflux disease without esophagitis -     ranitidine (ZANTAC) 150 MG tablet; Take 1 tablet (150 mg total) by mouth at bedtime.   I have discontinued Airam Schuchart's omeprazole. I am also having her start on ranitidine.  Additionally, I am having her maintain her Sphygmomanometer, Cholecalciferol, vitamin C, Biotin, Co Q-10, B-12, hydrALAZINE, sennosides-docusate sodium, saccharomyces boulardii, losartan-hydrochlorothiazide, amLODipine, and atorvastatin.  Meds ordered this encounter  Medications  . ranitidine (ZANTAC) 150 MG tablet    Sig: Take 1 tablet (150 mg total) by mouth at bedtime.    Dispense:  90 tablet    Refill:  3    Order Specific Question:   Supervising Provider    Answer:   MATTHEWS, CODY [[id number]]  . atorvastatin (LIPITOR) 10 MG tablet    Sig: Take 1 tablet (10 mg total) by mouth daily.    Dispense:  90 tablet    Refill:  3    Order Specific Question:   Supervising Provider    Answer:   Zigmund Daniel, CODY [[id number]]    Follow-up: Return in about 6 months (around 04/02/2018) for CPE (fasting).  Wilfred Lacy, NP

## 2017-10-02 NOTE — Patient Instructions (Addendum)
Stable renal, and liver function DM remains controlled with hgbA1c at 6.7. Recommend changing omeprazole to ranitidine 150mg  at hs, in place of omeprazole. New rx sent  Keep up the good work with weight loss Maintain DASH diet, small portions and regular execise (walking 40mins per day).

## 2017-10-03 ENCOUNTER — Telehealth: Payer: Self-pay | Admitting: Nurse Practitioner

## 2017-10-03 MED ORDER — RANITIDINE HCL 150 MG PO TABS: 150.0000 mg | ORAL_TABLET | Freq: Every day | ORAL | 3 refills | Status: DC

## 2017-10-03 MED ORDER — ATORVASTATIN CALCIUM 10 MG PO TABS: 10.0000 mg | ORAL_TABLET | Freq: Every day | ORAL | 3 refills | Status: DC

## 2017-10-03 NOTE — Telephone Encounter (Signed)
Copied from [place] [id number]. Topic: Quick Communication - Lab Results >> Oct 03, 2017  9:35 AM Shawnie Pons, LPN wrote: Called patient to inform them of 10/03/17 lab results. When patient returns call, triage nurse may disclose results.  pt calling back for lab results

## 2017-10-03 NOTE — Telephone Encounter (Signed)
Returned call to pt and Pt given lab results per notes of [place],NP on 10/03/17. Pt verbalized understanding.Unable to document in result note.

## 2017-10-04 ENCOUNTER — Other Ambulatory Visit: Payer: Self-pay | Admitting: Interventional Cardiology

## 2017-10-13 ENCOUNTER — Telehealth: Payer: Self-pay | Admitting: Nurse Practitioner

## 2017-10-13 NOTE — Telephone Encounter (Signed)
Rose Miller please help    Copied from [place] [id number]. Topic: General - Other >> Oct 13, 2017  2:10 PM Janace Aris A wrote: Reason for CRM: Patient called in regarding her medications ranitidine (ZANTAC) 150 MG tablet , and losartan-hydrochlorothiazide (HYZAAR) 100-25 MG tablet. She says that some of the ingredients in the medication can cause cancer and she would like someone to go over these concerns with her.   Please advise

## 2017-10-13 NOTE — Telephone Encounter (Signed)
Advised patient to check lot number on losartan/HCTZ and compare to FDA recall list.  For ranitidine, I informed her that FDA did not recommend discontinuation of medication. I offered switching medication to pepcid. She declined at this time.

## 2017-10-15 NOTE — Progress Notes (Deleted)
Cardiology Office Note:    Date:  10/15/2017   ID:  Rose Miller, DOB [birth year], MRN [id number]  PCP:  Rose Buffy, NP  Cardiologist:  No primary care provider on file.   Referring MD: Rose Buffy, NP   No chief complaint on file. ***  History of Present Illness:    Rose Miller is a 62 y.o. female with a hx of moderated aortic stenosis, left ventricular hypertrophy, and essential hypertension.  Past Medical History:  Diagnosis Date  . Allergy   . Arthritis   . Asthma   . Heart murmur   . Hypertension     Past Surgical History:  Procedure Laterality Date  . CESAREAN SECTION  1989  . TONSILLECTOMY AND ADENOIDECTOMY  1960    Current Medications: No outpatient medications have been marked as taking for the 10/16/17 encounter (Appointment) with Rose Crome, MD.     Allergies:   Patient has no known allergies.   Social History   Socioeconomic History  . Marital status: Single    Spouse name: Not on file  . Number of children: Not on file  . Years of education: Not on file  . Highest education level: Not on file  Occupational History  . Not on file  Social Needs  . Financial resource strain: Not on file  . Food insecurity:    Worry: Not on file    Inability: Not on file  . Transportation needs:    Medical: Not on file    Non-medical: Not on file  Tobacco Use  . Smoking status: Former Research scientist (life sciences)  . Smokeless tobacco: Never Used  Substance and Sexual Activity  . Alcohol use: No    Alcohol/week: 0.0 standard drinks  . Drug use: No  . Sexual activity: Not on file  Lifestyle  . Physical activity:    Days per week: Not on file    Minutes per session: Not on file  . Stress: Not on file  Relationships  . Social connections:    Talks on phone: Not on file    Gets together: Not on file    Attends religious service: Not on file    Active member of club or organization: Not on file    Attends meetings of clubs or organizations: Not on file      Relationship status: Not on file  Other Topics Concern  . Not on file  Social History Narrative  . Not on file     Family History: The patient's ***family history includes Arthritis in her mother; Cancer in her father and mother; Colon cancer in her father; Hypertension in her mother.  ROS:   Please see the history of present illness.    *** All other systems reviewed and are negative.  EKGs/Labs/Other Studies Reviewed:    The following studies were reviewed today: ***  EKG:  EKG is *** ordered today.  The ekg ordered today demonstrates ***  Recent Labs: 03/31/2017: Hemoglobin 11.6; Platelets 233.0; TSH 0.93 10/02/2017: ALT 14; BUN 22; Creatinine, Ser 1.25; Potassium 3.5; Sodium 140  Recent Lipid Panel    Component Value Date/Time   CHOL 142 03/31/2017 1400   TRIG 75.0 03/31/2017 1400   HDL 59.70 03/31/2017 1400   CHOLHDL 2 03/31/2017 1400   VLDL 15.0 03/31/2017 1400   LDLCALC 67 03/31/2017 1400    Physical Exam:    VS:  There were no vitals taken for this visit.    Wt Readings from Last 3 Encounters:  10/02/17 (!) 337 lb (152.9 kg)  05/01/17 (!) 345 lb 6.4 oz (156.7 kg)  04/07/17 (!) 339 lb (153.8 kg)     GEN: *** Well nourished, well developed in no acute distress HEENT: Normal NECK: No JVD. LYMPHATICS: No lymphadenopathy CARDIAC: ***RRR, ***murmur, ***gallop, *** edema. VASCULAR: *** pulses. *** bruits. RESPIRATORY:  Clear to auscultation without rales, wheezing or rhonchi  ABDOMEN: Soft, non-tender, non-distended, No pulsatile mass, MUSCULOSKELETAL: No deformity  SKIN: Warm and dry NEUROLOGIC:  Alert and oriented x 3 PSYCHIATRIC:  Normal affect   ASSESSMENT:    1. Aortic stenosis, moderate   2. Hyperlipidemia LDL goal <70   3. Essential hypertension   4. Severe concentric left ventricular hypertrophy    PLAN:    In order of problems listed above:  1. ***   Medication Adjustments/Labs and Tests Ordered: Current medicines are reviewed at  length with the patient today.  Concerns regarding medicines are outlined above.  No orders of the defined types were placed in this encounter.  No orders of the defined types were placed in this encounter.   There are no Patient Instructions on file for this visit.   Signed, Rose Grooms, MD  10/15/2017 11:28 PM    [place]

## 2017-10-16 ENCOUNTER — Ambulatory Visit: Payer: BLUE CROSS/BLUE SHIELD | Admitting: Interventional Cardiology

## 2017-10-17 ENCOUNTER — Encounter: Payer: Self-pay | Admitting: Interventional Cardiology

## 2017-10-23 ENCOUNTER — Encounter: Payer: Self-pay | Admitting: Interventional Cardiology

## 2017-10-23 ENCOUNTER — Ambulatory Visit: Payer: BLUE CROSS/BLUE SHIELD | Admitting: Interventional Cardiology

## 2017-10-23 VITALS — BP 120/74 | HR 69 | Ht 71.0 in | Wt 336.8 lb

## 2017-10-23 DIAGNOSIS — I10 Essential (primary) hypertension: Secondary | ICD-10-CM

## 2017-10-23 DIAGNOSIS — E785 Hyperlipidemia, unspecified: Secondary | ICD-10-CM | POA: Diagnosis not present

## 2017-10-23 DIAGNOSIS — I517 Cardiomegaly: Secondary | ICD-10-CM | POA: Diagnosis not present

## 2017-10-23 DIAGNOSIS — I350 Nonrheumatic aortic (valve) stenosis: Secondary | ICD-10-CM

## 2017-10-23 NOTE — Patient Instructions (Signed)
Medication Instructions:  Your physician recommends that you continue on your current medications as directed. Please refer to the Current Medication list given to you today.  If you need a refill on your cardiac medications before your next appointment, please call your pharmacy.   Lab work: None If you have labs (blood work) drawn today and your tests are completely normal, you will receive your results only by: [name] Kitchen MyChart Message (if you have MyChart) OR . A paper copy in the mail If you have any lab test that is abnormal or we need to change your treatment, we will call you to review the results.  Testing/Procedures: Your physician has requested that you have an echocardiogram. Echocardiography is a painless test that uses sound waves to create images of your heart. It provides your doctor with information about the size and shape of your heart and how well your heart's chambers and valves are working. This procedure takes approximately one hour. There are no restrictions for this procedure.   Follow-Up: At [facility], you and your health needs are our priority.  As part of our continuing mission to provide you with exceptional heart care, we have created designated Provider Care Teams.  These Care Teams include your primary Cardiologist (physician) and Advanced Practice Providers (APPs -  Physician Assistants and Nurse Practitioners) who all work together to provide you with the care you need, when you need it. You will need a follow up appointment in 6 months.  Please call our office 2 months in advance to schedule this appointment.  You may see Dr. [name] or one of the following Advanced Practice Providers on your designated Care Team:   Truitt Merle, NP Cecilie Kicks, NP . Kathyrn Drown, NP  Any Other Special Instructions Will Be Listed Below (If Applicable).

## 2017-10-23 NOTE — Progress Notes (Signed)
Cardiology Office Note:    Date:  10/23/2017   ID:  Rose Miller, DOB [birth year], MRN [id number]  PCP:  Flossie Buffy, NP  Cardiologist:  No primary care provider on file.   Referring MD: Flossie Buffy, NP   Chief Complaint  Patient presents with  . Cardiac Valve Problem    Aortic stenosis    History of Present Illness:    Rose Miller is a 62 y.o. female with a hx of obesity, severe hypertension, left ventricular hypertrophy, and moderate aortic stenosis.  Feels well.  Denies chest pain, syncope, and dyspnea.  Lower extremity swelling has disappeared with control of her blood pressure.  No side effects to the blood pressure medications.  She denies orthopnea.  No palpitations.  Past Medical History:  Diagnosis Date  . Allergy   . Arthritis   . Asthma   . Heart murmur   . Hypertension     Past Surgical History:  Procedure Laterality Date  . CESAREAN SECTION  1989  . TONSILLECTOMY AND ADENOIDECTOMY  1960    Current Medications: Current Meds  Medication Sig  . amLODipine (NORVASC) 10 MG tablet TAKE 1 TABLET BY MOUTH ONCE DAILY  . atorvastatin (LIPITOR) 10 MG tablet Take 1 tablet (10 mg total) by mouth daily.  . Biotin 5000 MCG CAPS Take 1 capsule by mouth daily.   . Blood Pressure Monitoring (SPHYGMOMANOMETER) MISC 1 Units by Does not apply route daily.  . Cholecalciferol (EQL VITAMIN D3) 1000 units tablet Take 1,000 Units by mouth daily.  . Coenzyme Q10 (CO Q-10) 100 MG CAPS Take 100 mg by mouth daily.   . Cyanocobalamin (B-12) 2500 MCG TABS Take 1 tablet by mouth daily.   . hydrALAZINE (APRESOLINE) 25 MG tablet Take 1 tablet (25 mg total) by mouth 2 (two) times daily.  [name] Kitchen losartan-hydrochlorothiazide (HYZAAR) 100-25 MG tablet TAKE 1 TABLET BY MOUTH ONCE DAILY -  PLEASE  KEEP  UPCOMING  APPT  WITH  DR  [name]  IN  OCTOBER  FOR  FUTURE  REFILLS  . saccharomyces boulardii (FLORASTOR) 250 MG capsule Take 250 mg by mouth daily.  . vitamin C (ASCORBIC ACID)  500 MG tablet Take 500 mg by mouth daily.      Allergies:   Patient has no known allergies.   Social History   Socioeconomic History  . Marital status: Single    Spouse name: Not on file  . Number of children: Not on file  . Years of education: Not on file  . Highest education level: Not on file  Occupational History  . Not on file  Social Needs  . Financial resource strain: Not on file  . Food insecurity:    Worry: Not on file    Inability: Not on file  . Transportation needs:    Medical: Not on file    Non-medical: Not on file  Tobacco Use  . Smoking status: Former Research scientist (life sciences)  . Smokeless tobacco: Never Used  Substance and Sexual Activity  . Alcohol use: No    Alcohol/week: 0.0 standard drinks  . Drug use: No  . Sexual activity: Not on file  Lifestyle  . Physical activity:    Days per week: Not on file    Minutes per session: Not on file  . Stress: Not on file  Relationships  . Social connections:    Talks on phone: Not on file    Gets together: Not on file    Attends religious service: Not on  file    Active member of club or organization: Not on file    Attends meetings of clubs or organizations: Not on file    Relationship status: Not on file  Other Topics Concern  . Not on file  Social History Narrative  . Not on file     Family History: The patient's  family history includes Arthritis in her mother; Cancer in her father and mother; Colon cancer in her father; Hypertension in her mother.  ROS:   Please see the history of present illness.    Arthritis left knee and occasional edema right knee and ankle.  None today.  All other systems reviewed and are negative.  EKGs/Labs/Other Studies Reviewed:    The following studies were reviewed today: 2D Doppler echocardiogram August 2018: Study Conclusions   - Left ventricle: The cavity size was normal. Wall thickness was   increased in a pattern of severe LVH. Systolic function was   vigorous. The estimated  ejection fraction was in the range of 65%   to 70%. Doppler parameters are consistent with restrictive left   ventricular relaxation (grade 3 diastolic dysfunction). The E/e&'   ratio is >20, suggesting markedly elevated LV filling pressure. - Aortic valve: Mildly calcified leaflets. Moderate stenosis. There   was mild regurgitation. Mean gradient (S): 24 mm Hg. Peak   gradient (S): 48 mm Hg. Valve area (VTI): 0.93 cm^2. Valve area   (Vmax): 0.89 cm^2. Valve area (Vmean): 0.82 cm^2. - Left atrium: The atrium was normal in size. - Right ventricle: The cavity size was normal. Systolic function   was low normal. - Right atrium: The atrium was normal in size. -Aortic velocity 3.45 m/s Impressions:   - LVEF 65-70%, severe LVH (IVSd >2 cm) - consider HCM, normal wall   motion, grade 3 DD with elevated LV filling pressures, moderate   aortic valve stenosis, normal biatrial size.   EKG:  EKG is  ordered today.  The ekg ordered today demonstrates normal sinus rhythm, normal PR interval, prominent voltage with left ventricular hypertrophy criteria.  Nonspecific T wave flattening.  Recent Labs: 03/31/2017: Hemoglobin 11.6; Platelets 233.0; TSH 0.93 10/02/2017: ALT 14; BUN 22; Creatinine, Ser 1.25; Potassium 3.5; Sodium 140  Recent Lipid Panel    Component Value Date/Time   CHOL 142 03/31/2017 1400   TRIG 75.0 03/31/2017 1400   HDL 59.70 03/31/2017 1400   CHOLHDL 2 03/31/2017 1400   VLDL 15.0 03/31/2017 1400   LDLCALC 67 03/31/2017 1400    Physical Exam:    VS:  BP 120/74   Pulse 69   Ht 5\' 11"  (1.803 m)   Wt (!) 336 lb 12.8 oz (152.8 kg)   BMI 46.97 kg/m     Wt Readings from Last 3 Encounters:  10/23/17 (!) 336 lb 12.8 oz (152.8 kg)  10/02/17 (!) 337 lb (152.9 kg)  05/01/17 (!) 345 lb 6.4 oz (156.7 kg)     GEN: Overweight.  Well developed in no acute distress HEENT: Normal NECK: No JVD. LYMPHATICS: No lymphadenopathy CARDIAC: RRR, 3/6 crescendo decrescendo right upper  sternal border and left mid sternal border systolic aortic valve stenosis murmur, S4 gallop, no edema. VASCULAR: 2+ and symmetric carotid and radial pulses.  Transmitted bilateral carotid bruits. RESPIRATORY:  Clear to auscultation without rales, wheezing or rhonchi  ABDOMEN: Soft, non-tender, non-distended, No pulsatile mass, MUSCULOSKELETAL: No deformity  SKIN: Warm and dry NEUROLOGIC:  Alert and oriented x 3 PSYCHIATRIC:  Normal affect   ASSESSMENT:  1. Aortic stenosis, moderate   2. Essential hypertension   3. Hyperlipidemia LDL goal <70   4. Severe concentric left ventricular hypertrophy   5. Morbid obesity (HCC)    PLAN:    In order of problems listed above:  1. 2D Doppler echocardiogram to assess severity of aortic stenosis compared to last year.  Reiterated that syncope, dyspnea, and chest pain will cardinal symptoms and need to be reported.  Clinical follow-up in 6 months 2. Blood pressure is well controlled.  Reviewed blood pressure log. 3. LDL cholesterol should be monitored..  Most recent he is 59 which is quite good given her vascular disease. 4. Echocardiogram will help [place] to reassess LV thickness.  Moderate aerobic activity is encouraged.  Call if change in exertional tolerance.  Call if cardinal symptoms.  80-month follow-up.  Natural history of aortic stenosis was discussed.  Potential treatment options also discussed.  She did inform me that 3 siblings have heart murmurs but does not know if they have bicuspid aortic valve disease.   Medication Adjustments/Labs and Tests Ordered: Current medicines are reviewed at length with the patient today.  Concerns regarding medicines are outlined above.  Orders Placed This Encounter  Procedures  . EKG 12-Lead   No orders of the defined types were placed in this encounter.   There are no Patient Instructions on file for this visit.   Signed, Sinclair Grooms, MD  10/23/2017 2:37 PM    [place]

## 2017-10-28 ENCOUNTER — Other Ambulatory Visit: Payer: Self-pay

## 2017-10-28 ENCOUNTER — Ambulatory Visit (HOSPITAL_COMMUNITY): Payer: BLUE CROSS/BLUE SHIELD | Attending: Interventional Cardiology

## 2017-10-28 DIAGNOSIS — I350 Nonrheumatic aortic (valve) stenosis: Secondary | ICD-10-CM | POA: Diagnosis not present

## 2017-11-03 ENCOUNTER — Telehealth: Payer: Self-pay | Admitting: *Deleted

## 2017-11-03 NOTE — Telephone Encounter (Signed)
-----   Message from Belva Crome, MD sent at 11/02/2017  9:52 PM EDT ----- Let the patient know echo shows valve and heart function are stable. A copy will be sent to [name], [name], NP

## 2017-11-03 NOTE — Telephone Encounter (Signed)
Follow Up ° °Pt returning call for nurse °

## 2017-11-03 NOTE — Telephone Encounter (Signed)
Working in [employer].  Called pt re: echo results.  Left a message for pt to call back.

## 2017-11-03 NOTE — Telephone Encounter (Signed)
Informed patient of results and verbal understanding expressed.  

## 2017-11-10 ENCOUNTER — Ambulatory Visit
Admission: RE | Admit: 2017-11-10 | Discharge: 2017-11-10 | Disposition: A | Discharge status: Home / Self Care | Payer: BLUE CROSS/BLUE SHIELD | Source: Ambulatory Visit | Attending: Nurse Practitioner | Admitting: Nurse Practitioner

## 2017-11-10 ENCOUNTER — Other Ambulatory Visit: Payer: Self-pay | Admitting: Nurse Practitioner

## 2017-11-10 DIAGNOSIS — N63 Unspecified lump in unspecified breast: Secondary | ICD-10-CM

## 2018-01-09 ENCOUNTER — Encounter: Payer: Self-pay | Admitting: Family Medicine

## 2018-01-09 ENCOUNTER — Ambulatory Visit: Payer: BLUE CROSS/BLUE SHIELD | Admitting: Family Medicine

## 2018-01-09 VITALS — BP 140/86 | HR 78 | Temp 98.4°F | Ht 71.0 in | Wt 344.4 lb

## 2018-01-09 DIAGNOSIS — J40 Bronchitis, not specified as acute or chronic: Secondary | ICD-10-CM

## 2018-01-09 MED ORDER — CLARITHROMYCIN 500 MG PO TABS: 500.0000 mg | ORAL_TABLET | Freq: Two times a day (BID) | ORAL | 0 refills | Status: AC

## 2018-01-09 MED ORDER — PREDNISONE 20 MG PO TABS: ORAL_TABLET | ORAL | 0 refills | Status: DC

## 2018-01-09 NOTE — Progress Notes (Signed)
Rose Miller is a 62 y.o. female  Chief Complaint  Patient presents with  . Cough    Patient is here today C/O cough and congestion x2wks.  Cough is productive with white to green sputum. Ears feel full and she states that it feels tender on her neck bilaterally beneath AU.  Has Tx with vicks, nyquil.  Voice has been hoarse and she is very fatigued.     HPI: Rose Miller is a 62 y.o. female complains of 2 week h/o productive cough, nasal congestion, B/L ear fullness, fatigue, hoarse voice. No SOB, DOE. Cough is mainly in AM when she first wakes up and at night once she is laying down.  Pt has been taking nyquil and using Vics rub.  No known sick contacts.   Past Medical History:  Diagnosis Date  . Allergy   . Arthritis   . Asthma   . Heart murmur   . Hypertension     Past Surgical History:  Procedure Laterality Date  . CESAREAN SECTION  1989  . TONSILLECTOMY AND ADENOIDECTOMY  1960    Social History   Socioeconomic History  . Marital status: Single    Spouse name: Not on file  . Number of children: Not on file  . Years of education: Not on file  . Highest education level: Not on file  Occupational History  . Not on file  Social Needs  . Financial resource strain: Not on file  . Food insecurity:    Worry: Not on file    Inability: Not on file  . Transportation needs:    Medical: Not on file    Non-medical: Not on file  Tobacco Use  . Smoking status: Former Research scientist (life sciences)  . Smokeless tobacco: Never Used  Substance and Sexual Activity  . Alcohol use: No    Alcohol/week: 0.0 standard drinks  . Drug use: No  . Sexual activity: Not on file  Lifestyle  . Physical activity:    Days per week: Not on file    Minutes per session: Not on file  . Stress: Not on file  Relationships  . Social connections:    Talks on phone: Not on file    Gets together: Not on file    Attends religious service: Not on file    Active member of club or organization: Not on file   Attends meetings of clubs or organizations: Not on file    Relationship status: Not on file  . Intimate partner violence:    Fear of current or ex partner: Not on file    Emotionally abused: Not on file    Physically abused: Not on file    Forced sexual activity: Not on file  Other Topics Concern  . Not on file  Social History Narrative  . Not on file    Family History  Problem Relation Age of Onset  . Arthritis Mother   . Cancer Mother        pancreatic  . Hypertension Mother   . Cancer Father        prostate and colon  . Colon cancer Father      Immunization History  Administered Date(s) Administered  . Influenza,inj,Quad PF,6+ Mos 12/19/2014  . Tdap 12/19/2014    Outpatient Encounter Medications as of 01/09/2018  Medication Sig  . amLODipine (NORVASC) 10 MG tablet TAKE 1 TABLET BY MOUTH ONCE DAILY  . atorvastatin (LIPITOR) 10 MG tablet Take 1 tablet (10 mg total) by mouth daily.  [name] Kitchen  Blood Pressure Monitoring (SPHYGMOMANOMETER) MISC 1 Units by Does not apply route daily.  . Cholecalciferol (EQL VITAMIN D3) 1000 units tablet Take 1,000 Units by mouth daily.  . Coenzyme Q10 (CO Q-10) 100 MG CAPS Take 100 mg by mouth daily.   . Cyanocobalamin (B-12) 2500 MCG TABS Take 1 tablet by mouth daily.   . hydrALAZINE (APRESOLINE) 25 MG tablet Take 1 tablet (25 mg total) by mouth 2 (two) times daily.  [name] Kitchen losartan-hydrochlorothiazide (HYZAAR) 100-25 MG tablet TAKE 1 TABLET BY MOUTH ONCE DAILY -  PLEASE  KEEP  UPCOMING  APPT  WITH  DR  [name]  IN  OCTOBER  FOR  FUTURE  REFILLS  . vitamin C (ASCORBIC ACID) 500 MG tablet Take 500 mg by mouth daily.   . Biotin 5000 MCG CAPS Take 1 capsule by mouth daily.   . clarithromycin (BIAXIN) 500 MG tablet Take 1 tablet (500 mg total) by mouth 2 (two) times daily for 7 days.  . predniSONE (DELTASONE) 20 MG tablet Take 3 tabs po x 2 days, then 2 tabs po x 2 days, then 1 tab po x 2 days, then 1/2 tab po x 2 days  . [DISCONTINUED] saccharomyces boulardii  (FLORASTOR) 250 MG capsule Take 250 mg by mouth daily.   No facility-administered encounter medications on file as of 01/09/2018.      ROS: Pertinent positives and negatives noted in HPI. Remainder of ROS non-contributory   No Known Allergies  BP 140/86 (BP Location: Left Arm, Patient Position: Sitting, Cuff Size: Large)   Pulse 78   Temp 98.4 F (36.9 C) (Oral)   Ht 5\' 11"  (1.803 m)   Wt (!) 344 lb 6.4 oz (156.2 kg)   SpO2 97%   BMI 48.03 kg/m   Physical Exam  Constitutional: She is oriented to person, place, and time. She appears well-developed and well-nourished. No distress.  HENT:  Head: Normocephalic and atraumatic.  Right Ear: Tympanic membrane and ear canal normal.  Left Ear: Tympanic membrane and ear canal normal.  Nose: Mucosal edema present. No rhinorrhea. Right sinus exhibits no maxillary sinus tenderness and no frontal sinus tenderness. Left sinus exhibits maxillary sinus tenderness. Left sinus exhibits no frontal sinus tenderness.  Mouth/Throat: Oropharynx is clear and moist and mucous membranes are normal. No oropharyngeal exudate.  Eyes: Pupils are equal, round, and reactive to light. Conjunctivae and EOM are normal. Right eye exhibits no discharge. Left eye exhibits no discharge.  Neck: Neck supple.  Cardiovascular: Normal rate, regular rhythm and normal heart sounds.  Pulmonary/Chest: Effort normal. No stridor. No respiratory distress. She has wheezes (diffuse expir wheeze).  + scattered coarse BS B/L  Lymphadenopathy:    She has no cervical adenopathy.  Neurological: She is alert and oriented to person, place, and time.     A/P:  1. Bronchitis - cont supportive care - increased fluids, nasal saline spray, humidifier Rx: - predniSONE (DELTASONE) 20 MG tablet; Take 3 tabs po x 2 days, then 2 tabs po x 2 days, then 1 tab po x 2 days, then 1/2 tab po x 2 days  Dispense: 13 tablet; Refill: 0 - clarithromycin (BIAXIN) 500 MG tablet; Take 1 tablet (500 mg  total) by mouth 2 (two) times daily for 7 days.  Dispense: 14 tablet; Refill: 0 - f/u if symptoms worsen or do not improve in 7-10 days Discussed plan and reviewed medications with patient, including risks, benefits, and potential side effects. Pt expressed understand. All questions answered.

## 2018-01-09 NOTE — Patient Instructions (Addendum)
Drink plenty of fluids, especially water Rest Use nasal saline spray at least 3 times per day Use humidifier at night Try Mucinex 1 tab twice per day Try flonase 2 sprays each nostril daily Take antibiotic as directed Take prednisone as directed Follow-up if symptoms worsen or do not improve in 7-10 days

## 2018-02-22 ENCOUNTER — Other Ambulatory Visit: Payer: Self-pay | Admitting: Interventional Cardiology

## 2018-03-02 ENCOUNTER — Other Ambulatory Visit: Payer: Self-pay | Admitting: *Deleted

## 2018-03-02 ENCOUNTER — Other Ambulatory Visit: Payer: Self-pay | Admitting: Nurse Practitioner

## 2018-03-02 ENCOUNTER — Telehealth: Payer: Self-pay | Admitting: Interventional Cardiology

## 2018-03-02 DIAGNOSIS — E785 Hyperlipidemia, unspecified: Secondary | ICD-10-CM

## 2018-03-02 MED ORDER — LOSARTAN POTASSIUM-HCTZ 100-25 MG PO TABS: 1.0000 | ORAL_TABLET | Freq: Every day | ORAL | 7 refills | Status: DC

## 2018-03-02 MED ORDER — LOSARTAN POTASSIUM 100 MG PO TABS: 100.0000 mg | ORAL_TABLET | Freq: Every day | ORAL | 7 refills | Status: DC

## 2018-03-02 MED ORDER — HYDROCHLOROTHIAZIDE 25 MG PO TABS: 25.0000 mg | ORAL_TABLET | Freq: Every day | ORAL | 7 refills | Status: DC

## 2018-03-02 NOTE — Telephone Encounter (Signed)
Rx sent in for losartan-hctz. Patients hydralazine was already sent in this month to [facility].

## 2018-03-02 NOTE — Telephone Encounter (Signed)
Rx's sent in as requested. Med list updated.

## 2018-03-02 NOTE — Telephone Encounter (Signed)
° ° °*  STAT* If patient is at the pharmacy, call can be transferred to refill team.   1. Which medications need to be refilled? (please list name of each medication and dose if known)  losartan-hydrochlorothiazide (HYZAAR) 100-25 MG tablet,  hydrALAZINE (APRESOLINE) 25 MG tablet 2. Which pharmacy/location (including street and city if local pharmacy) is medication to be sent to? [place], [place]  3. Do they need a 30 day or 90 day supply? [place]

## 2018-03-02 NOTE — Telephone Encounter (Signed)
 *  STAT* If patient is at the pharmacy, call can be transferred to refill team.   1. Which medications need to be refilled? (please list name of each medication and dose if known) losartan-hydrochlorothiazide (HYZAAR) 100-25 MG tablet  2. Which pharmacy/location (including street and city if local pharmacy) is medication to be sent to? [facility]  3. Do they need a 30 day or 90 day supply? 30 or 90  Pharmacist is requesting that this medication be wrote in two separate scripts. The mixture medication is on back order and they are not sure when they will get it in.

## 2018-03-03 ENCOUNTER — Telehealth: Payer: Self-pay | Admitting: Interventional Cardiology

## 2018-03-03 NOTE — Telephone Encounter (Signed)
Error

## 2018-03-04 ENCOUNTER — Other Ambulatory Visit: Payer: Self-pay | Admitting: Interventional Cardiology

## 2018-03-04 MED ORDER — HYDRALAZINE HCL 25 MG PO TABS: 25.0000 mg | ORAL_TABLET | Freq: Two times a day (BID) | ORAL | 2 refills | Status: DC

## 2018-05-05 DIAGNOSIS — R7303 Prediabetes: Secondary | ICD-10-CM | POA: Insufficient documentation

## 2018-05-05 DIAGNOSIS — I739 Peripheral vascular disease, unspecified: Secondary | ICD-10-CM | POA: Insufficient documentation

## 2018-05-13 ENCOUNTER — Other Ambulatory Visit: Payer: BLUE CROSS/BLUE SHIELD

## 2018-05-13 DIAGNOSIS — Z87891 Personal history of nicotine dependence: Secondary | ICD-10-CM | POA: Insufficient documentation

## 2018-05-13 DIAGNOSIS — Z7982 Long term (current) use of aspirin: Secondary | ICD-10-CM | POA: Insufficient documentation

## 2018-05-15 ENCOUNTER — Other Ambulatory Visit: Payer: BLUE CROSS/BLUE SHIELD

## 2018-08-03 IMAGING — US ULTRASOUND LEFT BREAST LIMITED
1 series · 6 of 6 positions shown · non-contrast
Comparison: Previous exam(s).

CLINICAL DATA: Short-term follow-up for a probably benign lesion in
the left breast, initially evaluated in October 2016.

EXAM:
DIGITAL DIAGNOSTIC LEFT MAMMOGRAM WITH CAD AND TOMO
ULTRASOUND LEFT BREAST

[Series 1: ultrasound left breast limited · 0.05mm/px · 6 of 6 slices shown]
[im 1/6]
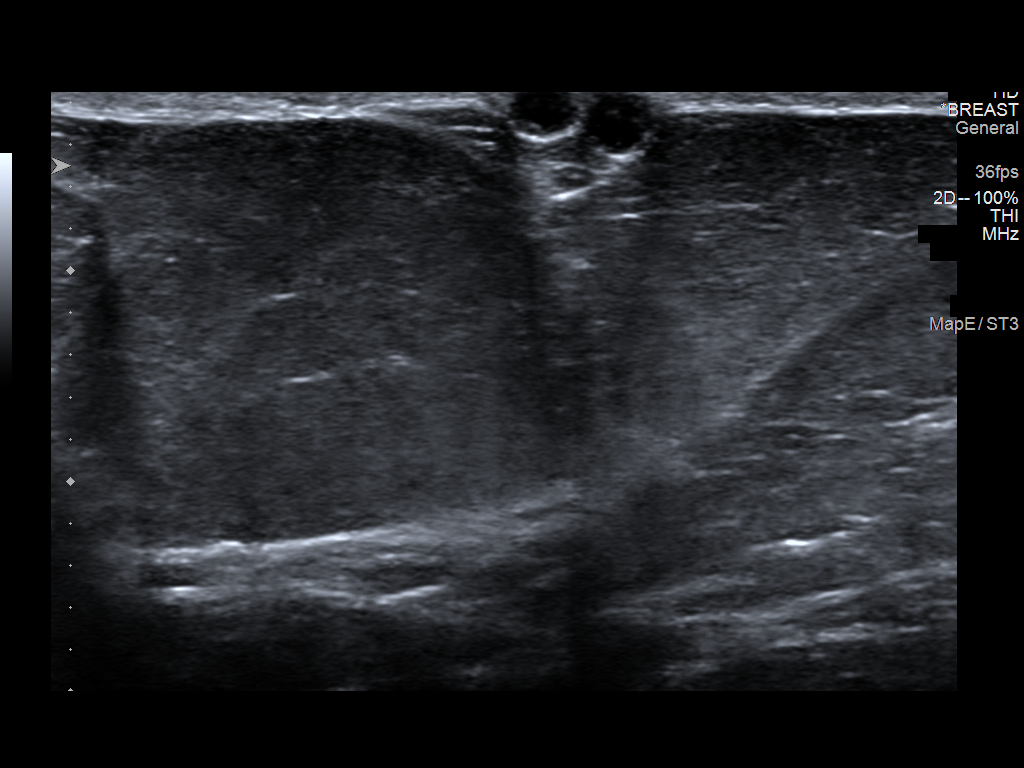
[im 2/6]
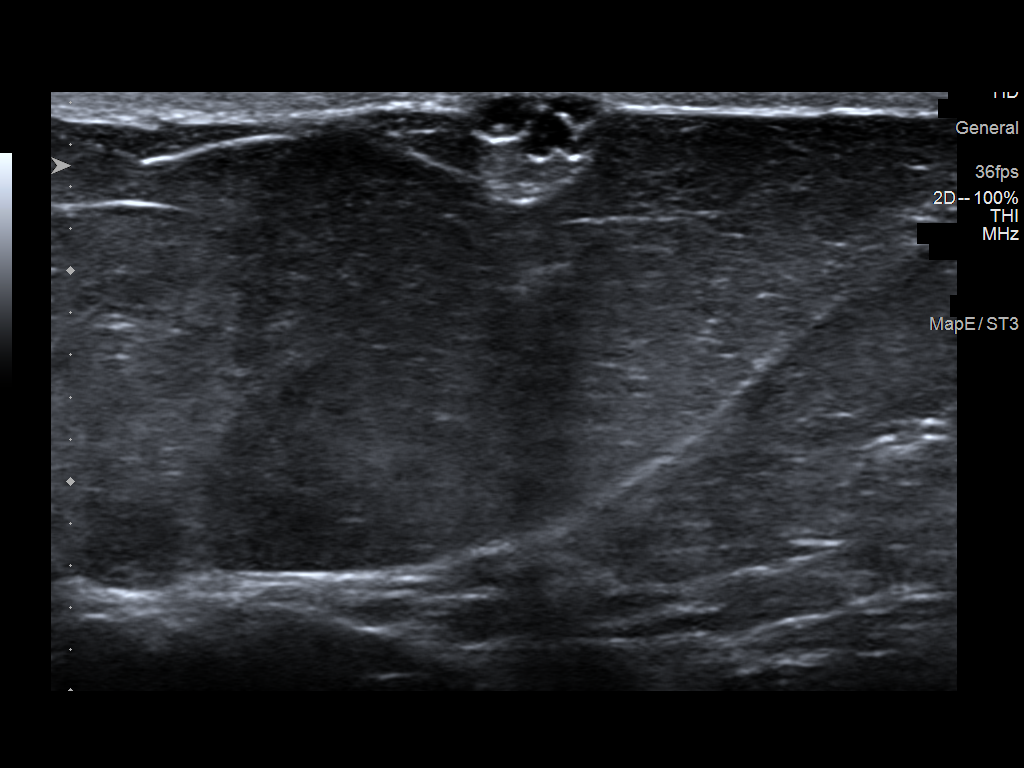
[im 3/6]
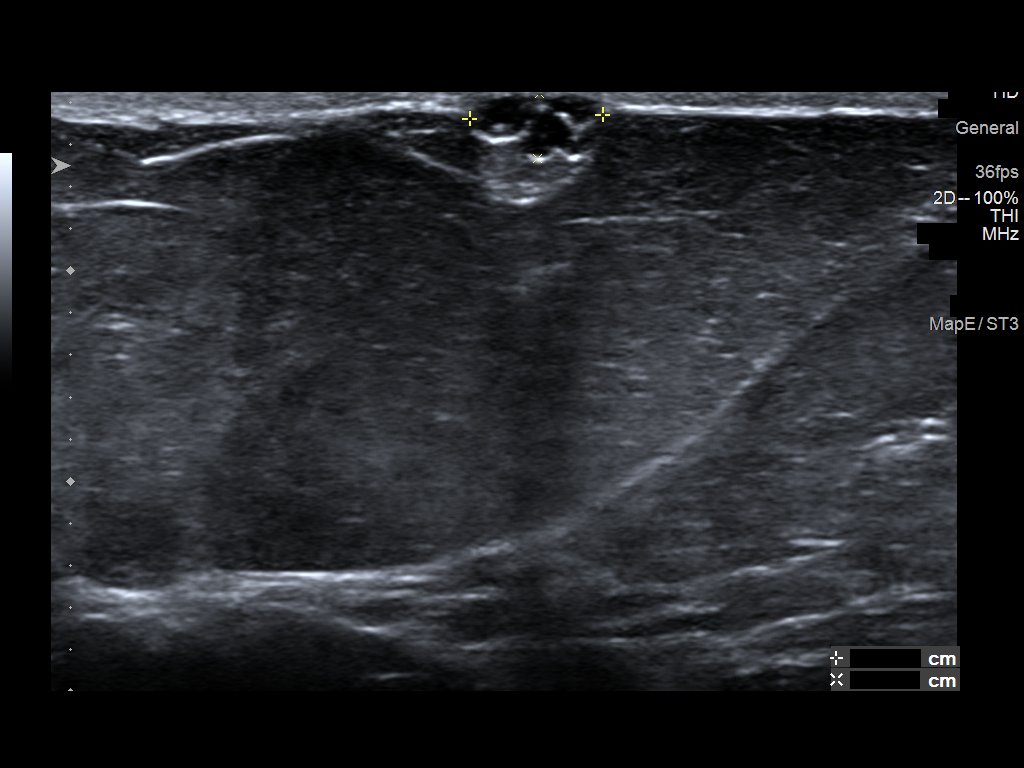
[im 4/6]
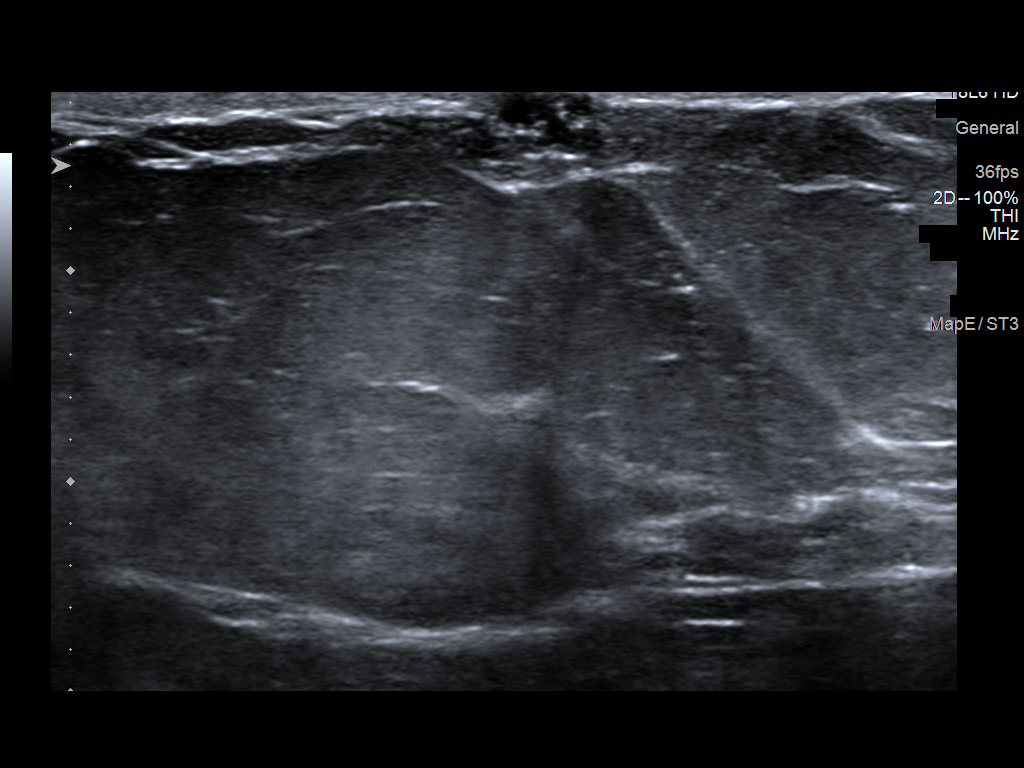
[im 5/6]
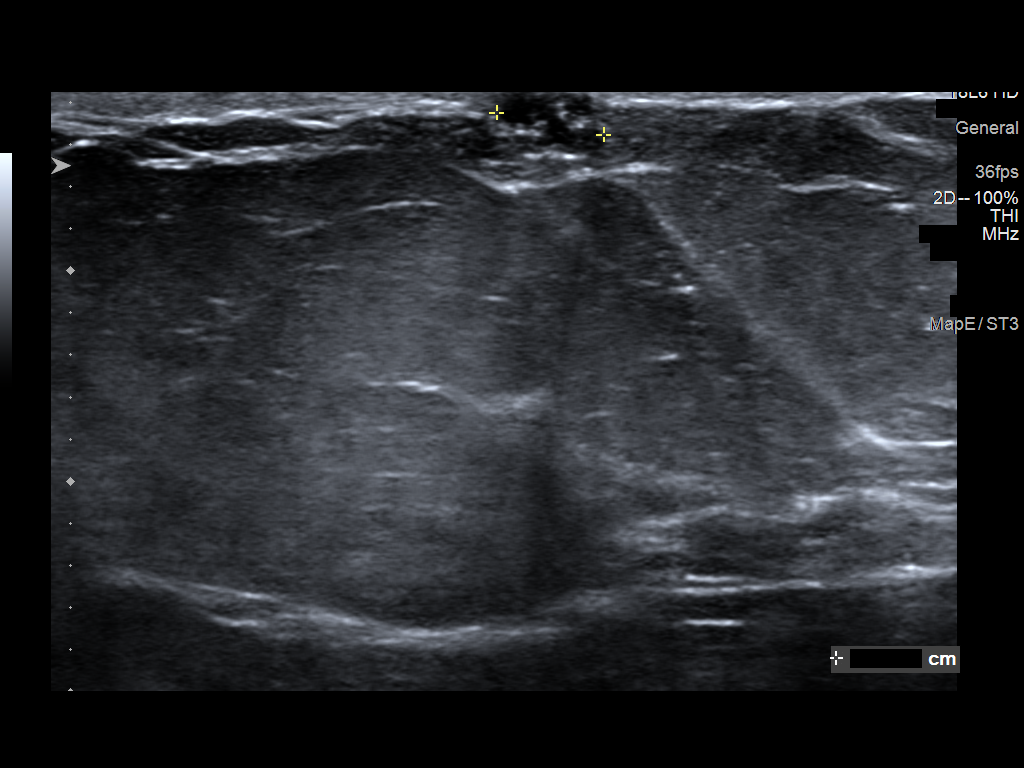
[im 6/6]
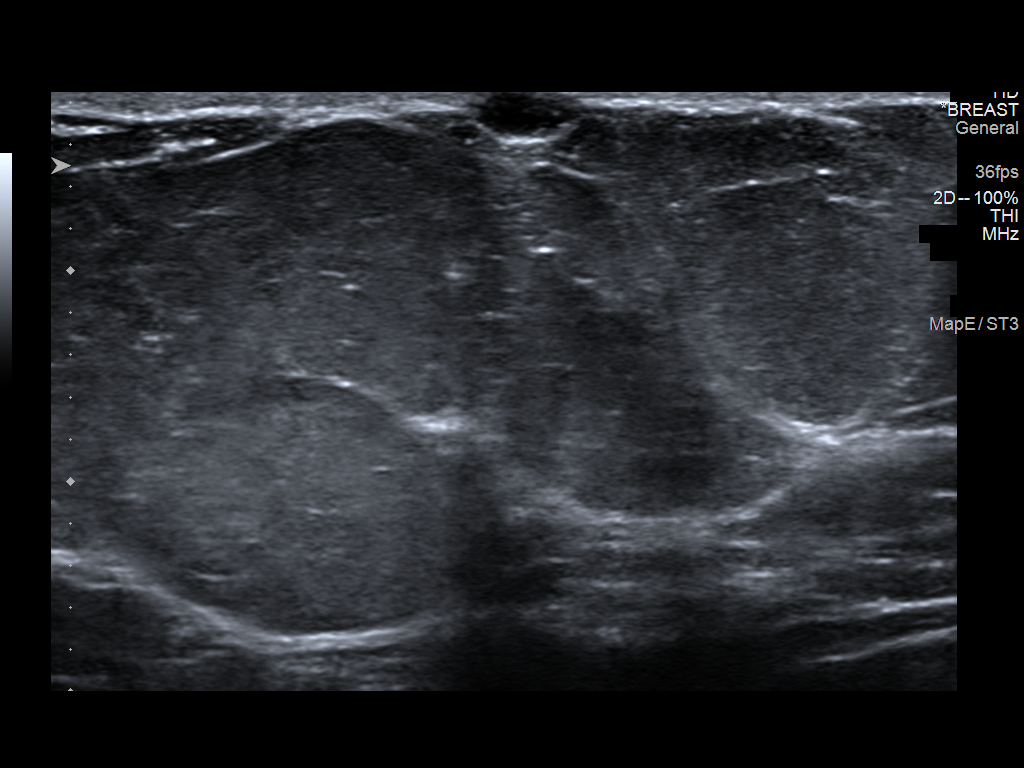

[6 of 6 positions shown; findings below may reference images not displayed]

ACR Breast Density Category b: There are scattered areas of
fibroglandular density.
FINDINGS: The small, relatively lucent, superficial lobulated mass in the
inferior left breast is without change. There are no new masses, no
areas of architectural distortion and no suspicious calcifications.

Mammographic images were processed with CAD.

Targeted ultrasound is performed, showing a superficial cystic mass
in the left breast, involving the deep dermis, lying at 8 o'clock, 2
cm the nipple, measuring 6 x 3 x 5 mm, without change from the prior
study.
IMPRESSION: 1. Probably benign small superficial mass in the left breast at 8
o'clock, most likely a complicated cyst, unchanged from the study
performed 6 months previously. Additional short-term follow-up
recommended.
2. No new abnormalities.

RECOMMENDATION:
Diagnostic mammography and left breast ultrasound in 6 months.

I have discussed the findings and recommendations with the patient.
Results were also provided in writing at the conclusion of the
visit. If applicable, a reminder letter will be sent to the patient
regarding the next appointment.

BI-RADS CATEGORY  3: Probably benign.

## 2018-08-03 IMAGING — MG DIGITAL DIAGNOSTIC UNILATERAL LEFT MAMMOGRAM WITH TOMO AND CAD
4 series · 4 of 12 positions shown · non-contrast
Comparison: Previous exam(s).

CLINICAL DATA: Short-term follow-up for a probably benign lesion in
the left breast, initially evaluated in October 2016.

EXAM:
DIGITAL DIAGNOSTIC LEFT MAMMOGRAM WITH CAD AND TOMO
ULTRASOUND LEFT BREAST

[L MLO synth-2D]
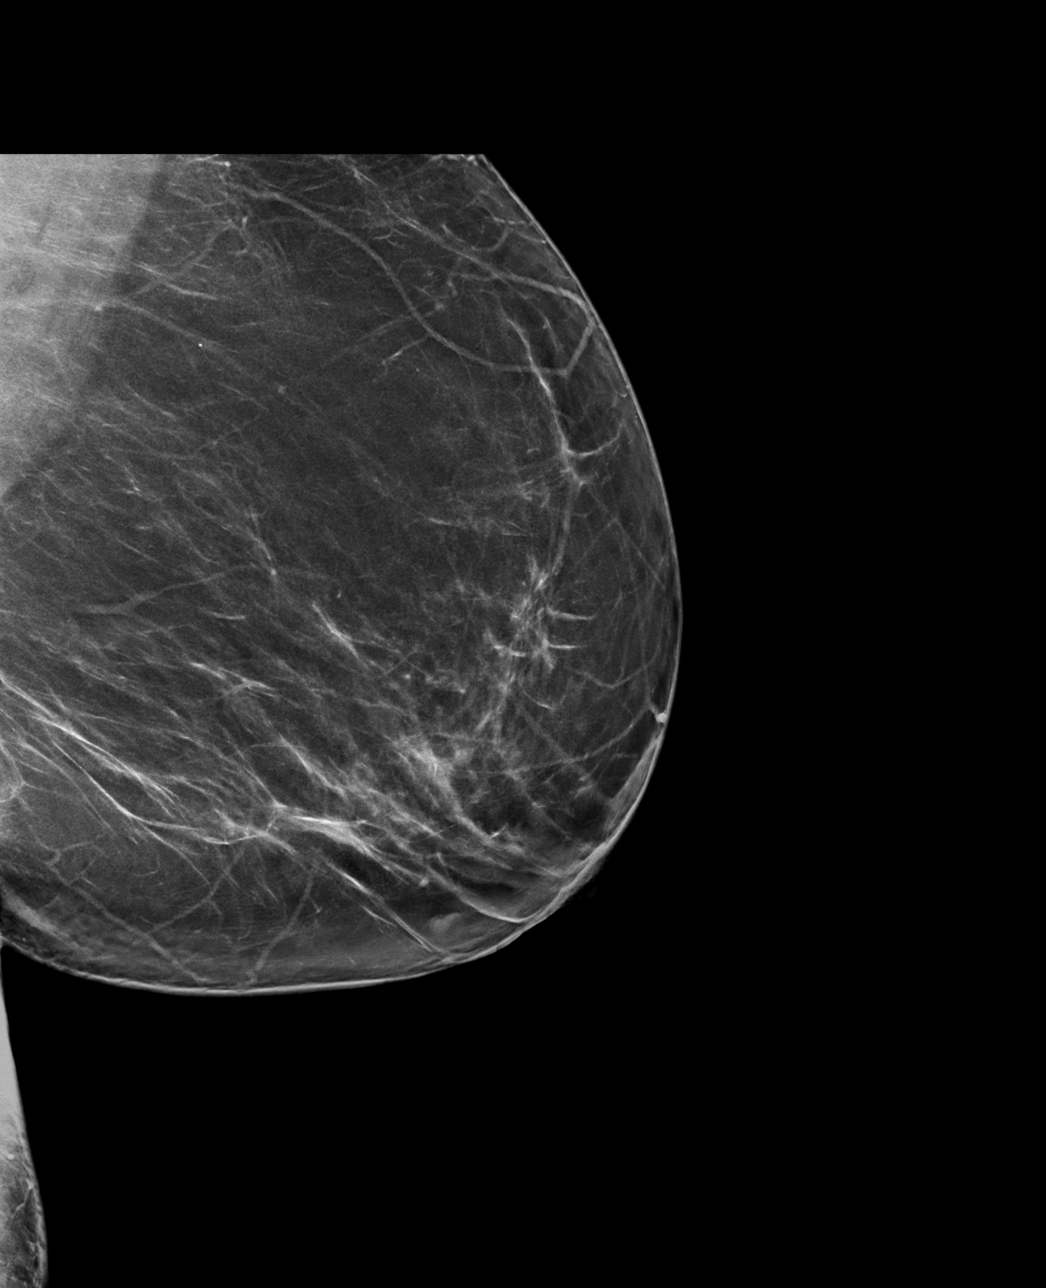

[L CC synth-2D]
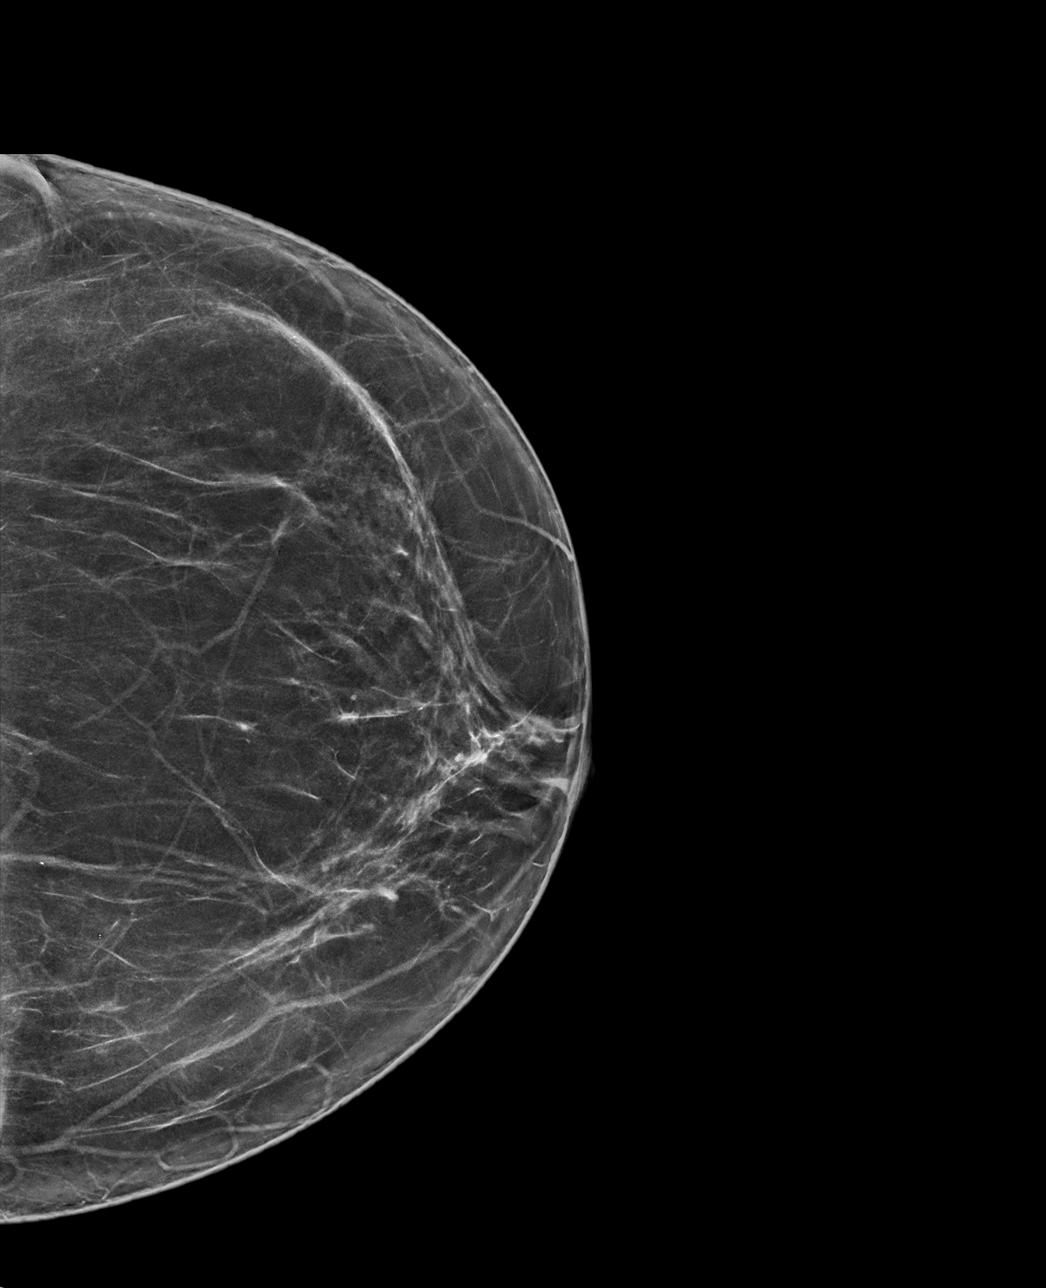

[L MLO tomo · tomo slice 42/83.0]
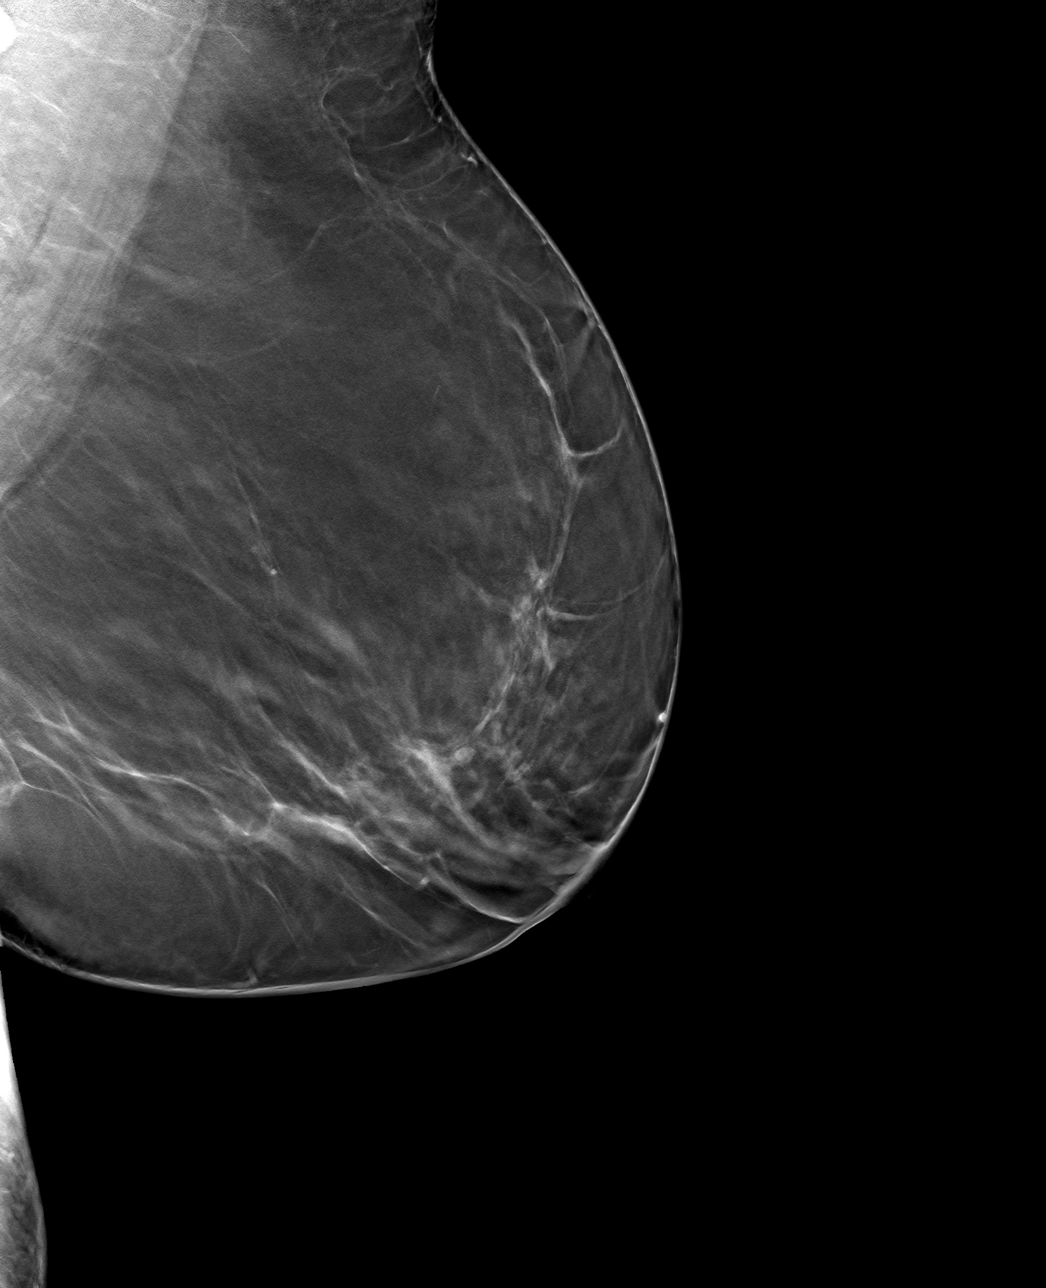

[L CC tomo · tomo slice 37/73.0]
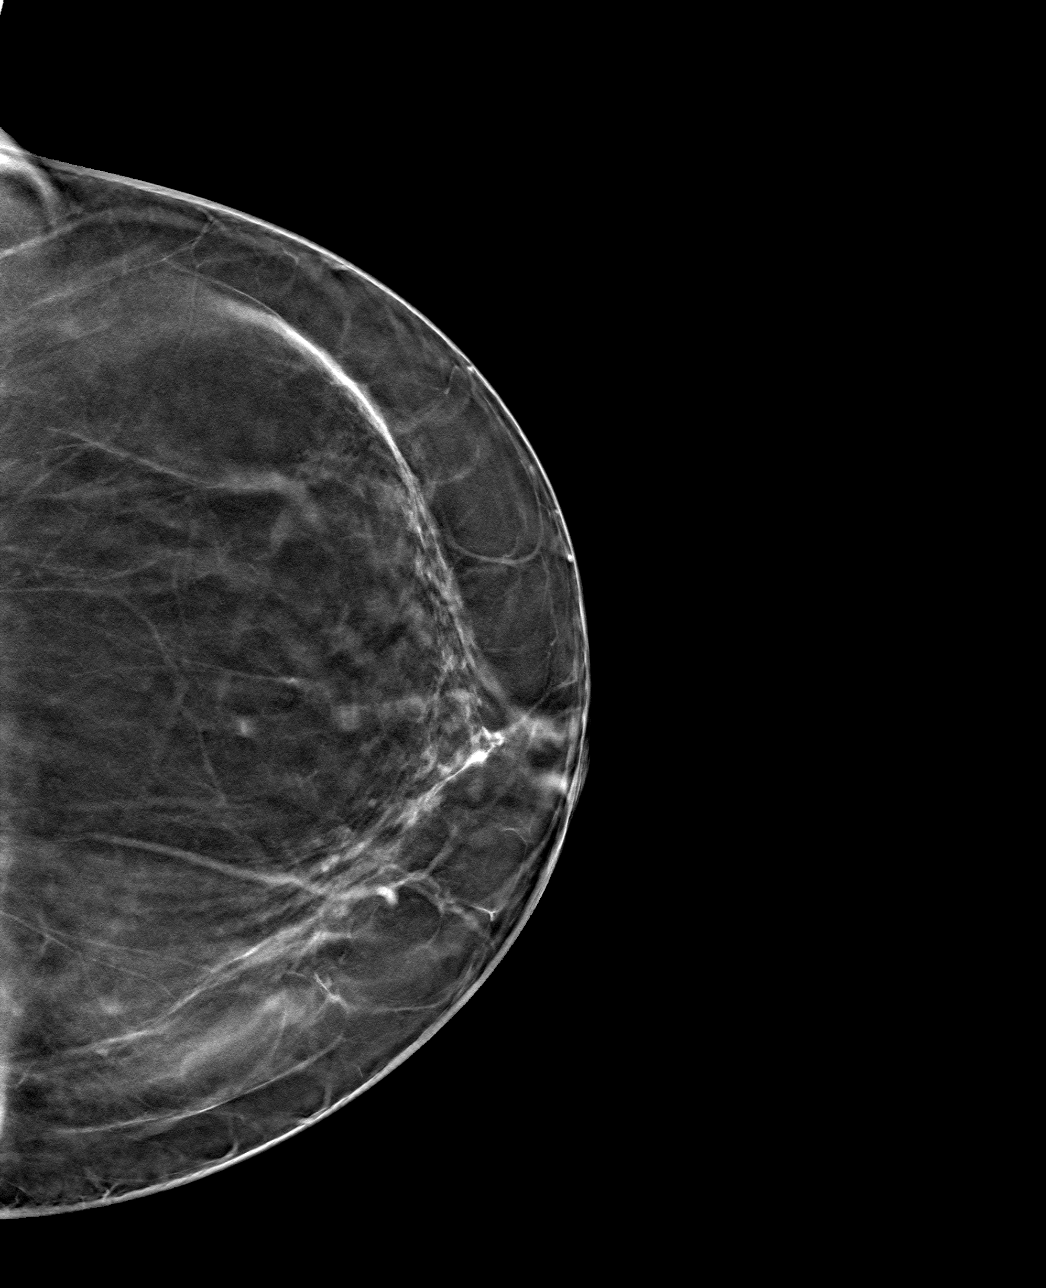

[4 of 12 positions shown; findings below may reference images not displayed]

ACR Breast Density Category b: There are scattered areas of
fibroglandular density.
FINDINGS: The small, relatively lucent, superficial lobulated mass in the
inferior left breast is without change. There are no new masses, no
areas of architectural distortion and no suspicious calcifications.

Mammographic images were processed with CAD.

Targeted ultrasound is performed, showing a superficial cystic mass
in the left breast, involving the deep dermis, lying at 8 o'clock, 2
cm the nipple, measuring 6 x 3 x 5 mm, without change from the prior
study.
IMPRESSION: 1. Probably benign small superficial mass in the left breast at 8
o'clock, most likely a complicated cyst, unchanged from the study
performed 6 months previously. Additional short-term follow-up
recommended.
2. No new abnormalities.

RECOMMENDATION:
Diagnostic mammography and left breast ultrasound in 6 months.

I have discussed the findings and recommendations with the patient.
Results were also provided in writing at the conclusion of the
visit. If applicable, a reminder letter will be sent to the patient
regarding the next appointment.

BI-RADS CATEGORY  3: Probably benign.

## 2018-08-21 ENCOUNTER — Other Ambulatory Visit: Payer: Self-pay | Admitting: Nurse Practitioner

## 2018-08-21 DIAGNOSIS — I10 Essential (primary) hypertension: Secondary | ICD-10-CM

## 2018-08-21 NOTE — Telephone Encounter (Signed)
Rose Miller please advise, last ov with you were 09/2017--saw heart doctor in 10/2017 (no future appt made). Do you want me to send in 1 mo supply and see if she can come in for a CPE before med runs out?

## 2018-08-24 ENCOUNTER — Other Ambulatory Visit: Payer: Self-pay | Admitting: Nurse Practitioner

## 2018-08-24 DIAGNOSIS — I10 Essential (primary) hypertension: Secondary | ICD-10-CM

## 2018-11-16 ENCOUNTER — Other Ambulatory Visit: Payer: Self-pay | Admitting: Interventional Cardiology

## 2019-01-05 ENCOUNTER — Other Ambulatory Visit: Payer: Self-pay | Admitting: Interventional Cardiology

## 2019-01-06 ENCOUNTER — Other Ambulatory Visit: Payer: Self-pay | Admitting: Interventional Cardiology

## 2019-02-02 ENCOUNTER — Other Ambulatory Visit: Payer: Self-pay | Admitting: Interventional Cardiology

## 2019-02-16 ENCOUNTER — Other Ambulatory Visit: Payer: Self-pay

## 2019-02-17 ENCOUNTER — Ambulatory Visit (INDEPENDENT_AMBULATORY_CARE_PROVIDER_SITE_OTHER): Payer: Medicare Other | Admitting: Nurse Practitioner

## 2019-02-17 ENCOUNTER — Encounter: Payer: Self-pay | Admitting: Nurse Practitioner

## 2019-02-17 VITALS — BP 154/86 | HR 72 | Temp 96.9°F | Ht 71.0 in | Wt 327.4 lb

## 2019-02-17 DIAGNOSIS — N1831 Chronic kidney disease, stage 3a: Secondary | ICD-10-CM

## 2019-02-17 DIAGNOSIS — E785 Hyperlipidemia, unspecified: Secondary | ICD-10-CM

## 2019-02-17 DIAGNOSIS — I10 Essential (primary) hypertension: Secondary | ICD-10-CM | POA: Diagnosis not present

## 2019-02-17 DIAGNOSIS — E1169 Type 2 diabetes mellitus with other specified complication: Secondary | ICD-10-CM | POA: Diagnosis not present

## 2019-02-17 DIAGNOSIS — Z780 Asymptomatic menopausal state: Secondary | ICD-10-CM | POA: Diagnosis not present

## 2019-02-17 DIAGNOSIS — E876 Hypokalemia: Secondary | ICD-10-CM

## 2019-02-17 DIAGNOSIS — Z0000 Encounter for general adult medical examination without abnormal findings: Secondary | ICD-10-CM

## 2019-02-17 DIAGNOSIS — Z1231 Encounter for screening mammogram for malignant neoplasm of breast: Secondary | ICD-10-CM | POA: Diagnosis not present

## 2019-02-17 DIAGNOSIS — T502X5A Adverse effect of carbonic-anhydrase inhibitors, benzothiadiazides and other diuretics, initial encounter: Secondary | ICD-10-CM

## 2019-02-17 LAB — COMPREHENSIVE METABOLIC PANEL
ALT: 14 U/L (ref 0–35)
AST: 16 U/L (ref 0–37)
Albumin: 4.4 g/dL (ref 3.5–5.2)
Alkaline Phosphatase: 75 U/L (ref 39–117)
BUN: 20 mg/dL (ref 6–23)
CO2: 28 mEq/L (ref 19–32)
Calcium: 9.1 mg/dL (ref 8.4–10.5)
Chloride: 98 mEq/L (ref 96–112)
Creatinine, Ser: 1.34 mg/dL — ABNORMAL HIGH (ref 0.40–1.20)
GFR: 48.01 mL/min — ABNORMAL LOW (ref >60.00)
Glucose, Bld: 111 mg/dL — ABNORMAL HIGH (ref 70–99)
Potassium: 3 mEq/L — ABNORMAL LOW (ref 3.5–5.1)
Sodium: 136 mEq/L (ref 135–145)
Total Bilirubin: 0.5 mg/dL (ref 0.2–1.2)
Total Protein: 7.4 g/dL (ref 6.0–8.3)

## 2019-02-17 LAB — LIPID PANEL
Cholesterol: 142 mg/dL (ref 0–200)
HDL: 52 mg/dL (ref >39.00)
LDL Cholesterol: 71 mg/dL (ref 0–99)
NonHDL: 89.83
Total CHOL/HDL Ratio: 3
Triglycerides: 94 mg/dL (ref 0.0–149.0)
VLDL: 18.8 mg/dL (ref 0.0–40.0)

## 2019-02-17 LAB — CBC
HCT: 34.1 % — ABNORMAL LOW (ref 36.0–46.0)
Hemoglobin: 11.5 g/dL — ABNORMAL LOW (ref 12.0–15.0)
MCHC: 33.7 g/dL (ref 30.0–36.0)
MCV: 87.2 fl (ref 78.0–100.0)
Platelets: 243 10*3/uL (ref 150.0–400.0)
RBC: 3.91 Mil/uL (ref 3.87–5.11)
RDW: 15.1 % (ref 11.5–15.5)
WBC: 5.2 10*3/uL (ref 4.0–10.5)

## 2019-02-17 LAB — HEMOGLOBIN A1C: Hgb A1c MFr Bld: 6.2 % (ref 4.6–6.5)

## 2019-02-17 LAB — MICROALBUMIN / CREATININE URINE RATIO
Creatinine,U: 122.5 mg/dL
Microalb Creat Ratio: 0.9 mg/g (ref 0.0–30.0)
Microalb, Ur: 1.1 mg/dL (ref 0.0–1.9)

## 2019-02-17 LAB — TSH: TSH: 1.72 u[IU]/mL (ref 0.35–4.50)

## 2019-02-17 NOTE — Patient Instructions (Addendum)
Stable cbc with mild anemia Stable hgbA1c at 6.2 (controlled DM) Normal lipid panel with LDL at goal. Normal TSH Stable renal function at stage 3 Mild decrease in potassium due to use of HCTZ. Supplement sent x 3days. F/up in 70months  You will be contacted to schedule appt for mammogram and dexa scan. Let me know if you change your mind about PAP smear.  Rose Miller , Thank you for taking time to come for your Medicare Wellness Visit. I appreciate your ongoing commitment to your health goals. Please review the following plan we discussed and let me know if I can assist you in the future.   These are the goals we discussed: Proper foot care and proper fitting footwear at all time. Daily exercise and DASH diet to promote weight loss. Annual diabetic eye exam Obtain advanced directive form (living will and HCPOA). You are due for repeat colonoscopy 05/2019.   This is a list of the screening recommended for you and due dates:  Health Maintenance  Topic Date Due  . DEXA scan (bone density measurement)  12/30/2018  . Pneumonia vaccines (1 of 2 - PCV13) 12/30/2018  . Pap Smear  03/30/2019  . Flu Shot  04/14/2019*  . Colon Cancer Screening  06/07/2019  . Mammogram  11/11/2019  . Tetanus Vaccine  12/18/2024  .  Hepatitis C: One time screening is recommended by Center for Disease Control  (CDC) for  adults born from [birth year] through 1967.   Completed  . HIV Screening  Completed  *Topic was postponed. The date shown is not the original due date.

## 2019-02-17 NOTE — Progress Notes (Signed)
Subjective:    Patient ID: Rose Miller, female    DOB: 27-Jan-1955, 64 y.o.   MRN: [id number]  Patient presents today for welcome to medicare exam and eval of chronic conditions  HPI DM: Declined use of medication due to possible side effects. She made changes to her diet and decrease portions. This led with 17lbs weight loss. Denies any neuropathy. Up to date with eye exam. Normal urine microalbumin LDL at goal with atorvastatin. Wt Readings from Last 3 Encounters:  02/17/19 (!) 327 lb 6.4 oz (148.5 kg)  01/09/18 (!) 344 lb 6.4 oz (156.2 kg)  10/23/17 (!) 336 lb 12.8 oz (152.8 kg)   HTN: Elevated BP today Current use of hydralazine, HCTZ and amlodipine. BP Readings from Last 3 Encounters:  02/17/19 (!) 154/86  01/09/18 140/86  10/23/17 120/74   Aortic Valve Stenosis: No dyspnea, no LE edema, no palpitations, no cough. Last echo 06/2016  Sexual History (orientation,birth control, marital status, STD):single, not sexually active, lives with sister  Depression/Suicide: Depression screen [facility] 2/9 02/17/2019 05/01/2017 03/31/2017 03/29/2016  Decreased Interest 0 0 0 0  Down, Depressed, Hopeless 0 0 0 0  PHQ - 2 Score 0 0 0 0    Hearing Screening   125Hz  250Hz  500Hz  1000Hz  2000Hz  3000Hz  4000Hz  6000Hz  8000Hz   Right ear:           Left ear:             Visual Acuity Screening   Right eye Left eye Both eyes  Without correction:     With correction: 20/20 20/25 20/20    Immunizations: (TDAP, Hep C screen, Pneumovax, Influenza, zoster): she declined colon cancer and cervical cancer screen. Health Maintenance  Topic Date Due  . DEXA scan (bone density measurement)  12/30/2018  . Pap Smear  03/30/2019  . Flu Shot  04/14/2019*  . Pneumonia vaccines (1 of 2 - PCV13) 02/17/2020*  . Colon Cancer Screening  06/07/2019  . Mammogram  11/11/2019  . Tetanus Vaccine  12/18/2024  .  Hepatitis C: One time screening is recommended by Center for Disease Control  (CDC) for  adults born  from [birth year] through 1967.   Completed  . HIV Screening  Completed  *Topic was postponed. The date shown is not the original due date.   Diet:DASH diet and small portions  Weight:  Wt Readings from Last 3 Encounters:  02/17/19 (!) 327 lb 6.4 oz (148.5 kg)  01/09/18 (!) 344 lb 6.4 oz (156.2 kg)  10/23/17 (!) 336 lb 12.8 oz (152.8 kg)   Exercise:none due to cold weather  Fall Risk: Fall Risk  02/17/2019 05/01/2017 03/31/2017 03/29/2016  Falls in the past year? 0 No No No   Advanced Directive: Advanced Directives 05/01/2017  Does Patient Have a Medical Advance Directive? No  Would patient like information on creating a medical advance directive? No - Patient declined     Medications and allergies reviewed with patient and updated if appropriate.  Patient Active Problem List   Diagnosis Date Noted  . Class 3 severe obesity due to excess calories with serious comorbidity and body mass index (BMI) of 45.0 to 49.9 in adult ([place]) 03/31/2017  . Hyperglycemia 03/31/2017  . CKD (chronic kidney disease) stage 3, GFR 30-59 ml/min 03/31/2017  . Hyperlipidemia LDL goal <70 06/28/2016  . Aortic valve stenosis 03/29/2016  . Periumbilical pain [id number]  . Adhesive capsulitis of right shoulder 09/29/2014  . IT band syndrome 09/11/2014  . Right shoulder pain 09/11/2014  .  Ventral hernia 05/01/2014  . Essential hypertension 03/17/2014    Current Outpatient Medications on File Prior to Visit  Medication Sig Dispense Refill  . amLODipine (NORVASC) 10 MG tablet Take 1 tablet (10 mg total) by mouth daily. Need Office visit for additional refill 90 tablet 0  . atorvastatin (LIPITOR) 10 MG tablet TAKE 1 TABLET BY MOUTH ONCE DAILY 90 tablet 3  . Blood Pressure Monitoring (SPHYGMOMANOMETER) MISC 1 Units by Does not apply route daily. 1 each 0  . Cholecalciferol (EQL VITAMIN D3) 1000 units tablet Take 1,000 Units by mouth daily.    . Coenzyme Q10 (CO Q-10) 100 MG CAPS Take 100 mg by mouth daily.     .  Cyanocobalamin (B-12) 2500 MCG TABS Take 1 tablet by mouth daily.     . hydrALAZINE (APRESOLINE) 25 MG tablet Take 1 tablet (25 mg total) by mouth 2 (two) times daily. Please call to schedule appt for future refills. 2nd attempt 30 tablet 0  . hydrochlorothiazide (HYDRODIURIL) 25 MG tablet Take 1 tablet (25 mg total) by mouth daily. 30 tablet 7  . losartan (COZAAR) 100 MG tablet Take 1 tablet (100 mg total) by mouth daily. 30 tablet 7  . vitamin C (ASCORBIC ACID) 500 MG tablet Take 500 mg by mouth daily.      No current facility-administered medications on file prior to visit.    Past Medical History:  Diagnosis Date  . Allergy   . Arthritis   . Asthma   . Diabetes mellitus without complication ([place])   . Heart murmur   . Hypertension     Past Surgical History:  Procedure Laterality Date  . CESAREAN SECTION  1989  . TONSILLECTOMY AND ADENOIDECTOMY  1960    Social History   Socioeconomic History  . Marital status: Single    Spouse name: Not on file  . Number of children: Not on file  . Years of education: Not on file  . Highest education level: Not on file  Occupational History  . Not on file  Tobacco Use  . Smoking status: Former Research scientist (life sciences)  . Smokeless tobacco: Never Used  Substance and Sexual Activity  . Alcohol use: No    Alcohol/week: 0.0 standard drinks  . Drug use: No  . Sexual activity: Not on file  Other Topics Concern  . Not on file  Social History Narrative  . Not on file   Social Determinants of Health   Financial Resource Strain:   . Difficulty of Paying Living Expenses: Not on file  Food Insecurity:   . Worried About Charity fundraiser in the Last Year: Not on file  . Ran Out of Food in the Last Year: Not on file  Transportation Needs:   . Lack of Transportation (Medical): Not on file  . Lack of Transportation (Non-Medical): Not on file  Physical Activity:   . Days of Exercise per Week: Not on file  . Minutes of Exercise per Session: Not on file    Stress:   . Feeling of Stress : Not on file  Social Connections:   . Frequency of Communication with Friends and Family: Not on file  . Frequency of Social Gatherings with Friends and Family: Not on file  . Attends Religious Services: Not on file  . Active Member of Clubs or Organizations: Not on file  . Attends Archivist Meetings: Not on file  . Marital Status: Not on file    Family History  Problem Relation Age  of Onset  . Arthritis Mother   . Cancer Mother        pancreatic  . Hypertension Mother   . Cancer Father        prostate and colon  . Colon cancer Father        Review of Systems  Constitutional: Negative for fever, malaise/fatigue and weight loss.  HENT: Negative for congestion and sore throat.   Eyes:       Negative for visual changes  Respiratory: Negative for cough and shortness of breath.   Cardiovascular: Negative for chest pain, palpitations and leg swelling.  Gastrointestinal: Negative for blood in stool, constipation, diarrhea and heartburn.  Genitourinary: Negative for dysuria, frequency and urgency.  Musculoskeletal: Negative for falls, joint pain and myalgias.  Skin: Negative for rash.  Neurological: Negative for dizziness, sensory change and headaches.  Endo/Heme/Allergies: Does not bruise/bleed easily.  Psychiatric/Behavioral: Negative for depression, memory loss, substance abuse and suicidal ideas. The patient is not nervous/anxious and does not have insomnia.     Objective:   Vitals:   02/17/19 0820  BP: (!) 154/86  Pulse: 72  Temp: (!) 96.9 F (36.1 C)  SpO2: 98%    Body mass index is 45.66 kg/m.   Physical Examination:  Physical Exam Vitals reviewed.  Constitutional:      General: She is not in acute distress.    Appearance: She is well-developed. She is obese.  HENT:     Right Ear: Tympanic membrane, ear canal and external ear normal.     Left Ear: Tympanic membrane, ear canal and external ear normal.  Eyes:      Extraocular Movements: Extraocular movements intact.     Conjunctiva/sclera: Conjunctivae normal.     Pupils: Pupils are equal, round, and reactive to light.  Neck:     Thyroid: No thyroid mass, thyromegaly or thyroid tenderness.  Cardiovascular:     Rate and Rhythm: Normal rate and regular rhythm.     Pulses: Normal pulses.     Heart sounds: Murmur present.  Pulmonary:     Effort: Pulmonary effort is normal. No respiratory distress.     Breath sounds: Normal breath sounds.  Chest:     Chest wall: No tenderness.  Abdominal:     General: Bowel sounds are normal.     Palpations: Abdomen is soft.  Genitourinary:    Comments: Declined breast and pelvic exam Musculoskeletal:        General: Normal range of motion.     Cervical back: Normal range of motion and neck supple.     Right lower leg: No edema.     Left lower leg: No edema.  Lymphadenopathy:     Cervical: No cervical adenopathy.  Skin:    General: Skin is warm and dry.     Comments: Diabetic foot exam completed (abnormal)  Neurological:     Mental Status: She is alert and oriented to person, place, and time.     Deep Tendon Reflexes: Reflexes are normal and symmetric.  Psychiatric:        Mood and Affect: Mood normal.        Behavior: Behavior normal.        Thought Content: Thought content normal.    ASSESSMENT and PLAN: This visit occurred during the SARS-CoV-2 public health emergency.  Safety protocols were in place, including screening questions prior to the visit, additional usage of staff PPE, and extensive cleaning of exam room while observing appropriate contact time as indicated for  disinfecting solutions.   Rose Miller was seen today for annual exam.  Diagnoses and all orders for this visit:  Welcome to Medicare preventive visit  Essential hypertension -     Comprehensive metabolic panel -     CBC -     TSH  Stage 3a chronic kidney disease -     Comprehensive metabolic panel  Hyperlipidemia LDL goal  <70 -     Comprehensive metabolic panel -     Lipid panel -     TSH  Postmenopausal estrogen deficiency -     DG Bone Density; Future  Type 2 diabetes mellitus with other specified complication, without long-term current use of insulin (HCC) -     Hemoglobin A1c -     Microalbumin / creatinine urine ratio -     Comprehensive metabolic panel  Breast cancer screening by mammogram -     Cancel: MM DIGITAL SCREENING BILATERAL; Future -     Cancel: MM DIAG BREAST TOMO BILATERAL; Future  Diuretic-induced hypokalemia -     potassium chloride SA (KLOR-CON) 20 MEQ tablet; Take 2 tablets (40 mEq total) by mouth daily.   No problem-specific Assessment & Plan notes found for this encounter.     Problem List Items Addressed This Visit      Cardiovascular and Mediastinum   Essential hypertension   Relevant Orders   Comprehensive metabolic panel (Completed)   CBC (Completed)   TSH (Completed)     Genitourinary   CKD (chronic kidney disease) stage 3, GFR 30-59 ml/min   Relevant Orders   Comprehensive metabolic panel (Completed)     Other   Hyperlipidemia LDL goal <70   Relevant Orders   Comprehensive metabolic panel (Completed)   Lipid panel (Completed)   TSH (Completed)    Other Visit Diagnoses    Welcome to Medicare preventive visit    -  Primary   Postmenopausal estrogen deficiency       Relevant Orders   DG Bone Density   Type 2 diabetes mellitus with other specified complication, without long-term current use of insulin (HCC)       Relevant Orders   Hemoglobin A1c (Completed)   Microalbumin / creatinine urine ratio (Completed)   Comprehensive metabolic panel (Completed)   Breast cancer screening by mammogram       Diuretic-induced hypokalemia       Relevant Medications   potassium chloride SA (KLOR-CON) 20 MEQ tablet       Follow up: Return in about 3 months (around 05/17/2019) for DM and HTN, hyperlipidemia (F2F, 61mins)).  Wilfred Lacy, NP

## 2019-02-18 ENCOUNTER — Other Ambulatory Visit: Payer: Self-pay | Admitting: Nurse Practitioner

## 2019-02-18 ENCOUNTER — Encounter: Payer: Self-pay | Admitting: Nurse Practitioner

## 2019-02-18 DIAGNOSIS — N63 Unspecified lump in unspecified breast: Secondary | ICD-10-CM

## 2019-02-18 DIAGNOSIS — E876 Hypokalemia: Secondary | ICD-10-CM | POA: Insufficient documentation

## 2019-02-18 DIAGNOSIS — T502X5A Adverse effect of carbonic-anhydrase inhibitors, benzothiadiazides and other diuretics, initial encounter: Secondary | ICD-10-CM | POA: Insufficient documentation

## 2019-02-18 DIAGNOSIS — Z780 Asymptomatic menopausal state: Secondary | ICD-10-CM | POA: Insufficient documentation

## 2019-02-18 MED ORDER — POTASSIUM CHLORIDE CRYS ER 20 MEQ PO TBCR: 40.0000 meq | EXTENDED_RELEASE_TABLET | Freq: Every day | ORAL | 0 refills | Status: DC

## 2019-02-18 MED ORDER — AMLODIPINE BESYLATE 10 MG PO TABS: 10.0000 mg | ORAL_TABLET | Freq: Every day | ORAL | 3 refills | Status: DC

## 2019-02-18 MED ORDER — ATORVASTATIN CALCIUM 10 MG PO TABS: 10.0000 mg | ORAL_TABLET | Freq: Every day | ORAL | 3 refills | Status: DC

## 2019-02-18 MED ORDER — HYDRALAZINE HCL 25 MG PO TABS: 25.0000 mg | ORAL_TABLET | Freq: Two times a day (BID) | ORAL | 3 refills | Status: DC

## 2019-02-18 MED ORDER — LOSARTAN POTASSIUM 100 MG PO TABS: 100.0000 mg | ORAL_TABLET | Freq: Every day | ORAL | 3 refills | Status: DC

## 2019-02-18 MED ORDER — HYDROCHLOROTHIAZIDE 25 MG PO TABS: 25.0000 mg | ORAL_TABLET | Freq: Every day | ORAL | 0 refills | Status: DC

## 2019-05-14 ENCOUNTER — Ambulatory Visit
Admission: RE | Admit: 2019-05-14 | Discharge: 2019-05-14 | Disposition: A | Discharge status: Home / Self Care | Payer: Medicare Other | Source: Ambulatory Visit | Attending: Nurse Practitioner | Admitting: Nurse Practitioner

## 2019-05-14 ENCOUNTER — Other Ambulatory Visit: Payer: Self-pay

## 2019-05-14 DIAGNOSIS — Z780 Asymptomatic menopausal state: Secondary | ICD-10-CM | POA: Diagnosis not present

## 2019-05-17 ENCOUNTER — Encounter: Payer: Self-pay | Admitting: Nurse Practitioner

## 2019-05-17 ENCOUNTER — Other Ambulatory Visit: Payer: Self-pay

## 2019-05-17 ENCOUNTER — Ambulatory Visit (INDEPENDENT_AMBULATORY_CARE_PROVIDER_SITE_OTHER): Payer: Medicare Other | Admitting: Nurse Practitioner

## 2019-05-17 VITALS — BP 158/60 | HR 73 | Temp 96.1°F | Ht 71.0 in | Wt 335.8 lb

## 2019-05-17 DIAGNOSIS — N1831 Chronic kidney disease, stage 3a: Secondary | ICD-10-CM

## 2019-05-17 DIAGNOSIS — T502X5A Adverse effect of carbonic-anhydrase inhibitors, benzothiadiazides and other diuretics, initial encounter: Secondary | ICD-10-CM | POA: Diagnosis not present

## 2019-05-17 DIAGNOSIS — I10 Essential (primary) hypertension: Secondary | ICD-10-CM | POA: Diagnosis not present

## 2019-05-17 DIAGNOSIS — E876 Hypokalemia: Secondary | ICD-10-CM

## 2019-05-17 LAB — BASIC METABOLIC PANEL
BUN: 13 mg/dL (ref 6–23)
CO2: 24 mEq/L (ref 19–32)
Calcium: 9.7 mg/dL (ref 8.4–10.5)
Chloride: 105 mEq/L (ref 96–112)
Creatinine, Ser: 0.85 mg/dL (ref 0.40–1.20)
GFR: 81.12 mL/min (ref >60.00)
Glucose, Bld: 127 mg/dL — ABNORMAL HIGH (ref 70–99)
Potassium: 4.5 mEq/L (ref 3.5–5.1)
Sodium: 140 mEq/L (ref 135–145)

## 2019-05-17 MED ORDER — LOSARTAN POTASSIUM-HCTZ 100-12.5 MG PO TABS: 1.0000 | ORAL_TABLET | Freq: Every day | ORAL | 3 refills | Status: DC

## 2019-05-17 NOTE — Assessment & Plan Note (Signed)
Elevated this morning. Reports she has not taken AM medications. Does not check BP at home Documented BP during OV with cardiology 11/2018: 126/76 Reviewed past Medical, Social and Family history today. Denies any CP or headache or LE edema or dizziness. BP Readings from Last 3 Encounters:  05/17/19 (!) 158/60  02/17/19 (!) 154/86  01/09/18 140/86   Monitor BP at home 2x/week, 1hr after AM medication Maintain DASH diet and regular exercise. HCTZ dose decrease to 12.5 to avoid repeat hypokalemia. Maintain losartan, amlodipine, and hydralazine F/up in 74months

## 2019-05-17 NOTE — Assessment & Plan Note (Signed)
Improved renal function BP controlled. BMP Latest Ref Rng & Units 05/17/2019 02/17/2019 10/02/2017  Glucose 70 - 99 mg/dL 127(H) 111(H) 117(H)  BUN 6 - 23 mg/dL 13 20 22   Creatinine 0.40 - 1.20 mg/dL 0.85 1.34(H) 1.25(H)  BUN/Creat Ratio 12 - 28 - - -  Sodium 135 - 145 mEq/L 140 136 140  Potassium 3.5 - 5.1 mEq/L 4.5 3.0(L) 3.5  Chloride 96 - 112 mEq/L 105 98 101  CO2 19 - 32 mEq/L 24 28 30   Calcium 8.4 - 10.5 mg/dL 9.7 9.1 9.4

## 2019-05-17 NOTE — Patient Instructions (Addendum)
Losartan/HCTZ 100/25mg  was sent last by your cardiologist.  Go to lab for repeat BMP, will sent refill after review of lab results.  Check BP at least 2x/week, 1hr after AM medication.

## 2019-05-17 NOTE — Progress Notes (Signed)
Subjective:  Patient ID: Rose Miller, female    DOB: [birth year]  Age: 64 y.o. MRN: [id number]  CC: Follow-up (3 month f/p-DM, HTN, hyperlipidemia-pt is fasting//DM-2 weeks A1c-5.6//HTN-around 144/73 without meds//No Covid//)  HPI HTN: Elevated this morning. Reports she has not taken AM medications. Does not check BP at home Documented BP during OV with cardiology 11/2018: 126/76 Reviewed past Medical, Social and Family history today. Denies any CP or headache or LE edema or dizziness. BP Readings from Last 3 Encounters:  05/17/19 (!) 158/60  02/17/19 (!) 154/86  01/09/18 140/86   DM: controlled Last HgbA1c of 6.2 Does not check glucose at home  Outpatient Medications Prior to Visit  Medication Sig Dispense Refill  . amLODipine (NORVASC) 10 MG tablet Take 1 tablet (10 mg total) by mouth daily. 90 tablet 3  . atorvastatin (LIPITOR) 10 MG tablet Take 1 tablet (10 mg total) by mouth daily. 90 tablet 3  . Bayer Microlet Lancets lancets Use when testing glucose as recommended.    . Blood Pressure Monitoring (SPHYGMOMANOMETER) MISC 1 Units by Does not apply route daily. 1 each 0  . Cholecalciferol (EQL VITAMIN D3) 1000 units tablet Take 1,000 Units by mouth daily.    . Coenzyme Q10 (CO Q-10) 100 MG CAPS Take 100 mg by mouth daily.     . Cyanocobalamin (B-12) 2500 MCG TABS Take 1 tablet by mouth daily.     [name] Kitchen glucose blood (CONTOUR NEXT TEST) test strip Test up to 4 times per day as recommended    . hydrALAZINE (APRESOLINE) 25 MG tablet Take 1 tablet (25 mg total) by mouth 2 (two) times daily. 180 tablet 3  . vitamin C (ASCORBIC ACID) 500 MG tablet Take 500 mg by mouth daily.     . hydrochlorothiazide (HYDRODIURIL) 25 MG tablet Take 1 tablet (25 mg total) by mouth daily. 90 tablet 0  . losartan (COZAAR) 100 MG tablet Take 1 tablet (100 mg total) by mouth daily. 90 tablet 3  . potassium chloride SA (KLOR-CON) 20 MEQ tablet Take 2 tablets (40 mEq total) by mouth daily. 6 tablet 0  .  losartan-hydrochlorothiazide (HYZAAR) 100-25 MG tablet Take 1 tablet by mouth daily. 90 tablet 3   No facility-administered medications prior to visit.    ROS See HPI  Objective:  BP (!) 158/60   Pulse 73   Temp (!) 96.1 F (35.6 C) (Tympanic)   Ht 5\' 11"  (1.803 m)   Wt (!) 335 lb 12.8 oz (152.3 kg)   SpO2 96%   BMI 46.83 kg/m   BP Readings from Last 3 Encounters:  05/17/19 (!) 158/60  02/17/19 (!) 154/86  01/09/18 140/86    Wt Readings from Last 3 Encounters:  05/17/19 (!) 335 lb 12.8 oz (152.3 kg)  02/17/19 (!) 327 lb 6.4 oz (148.5 kg)  01/09/18 (!) 344 lb 6.4 oz (156.2 kg)    Physical Exam Constitutional:      Appearance: She is obese.  Cardiovascular:     Rate and Rhythm: Normal rate and regular rhythm.     Pulses: Normal pulses.     Heart sounds: Murmur present.  Pulmonary:     Effort: Pulmonary effort is normal.     Breath sounds: Normal breath sounds.  Musculoskeletal:     Right lower leg: No edema.     Left lower leg: No edema.  Neurological:     Mental Status: She is alert and oriented to person, place, and time.    Lab Results  Component Value Date   WBC 5.2 02/17/2019   HGB 11.5 (L) 02/17/2019   HCT 34.1 (L) 02/17/2019   PLT 243.0 02/17/2019   GLUCOSE 127 (H) 05/17/2019   CHOL 142 02/17/2019   TRIG 94.0 02/17/2019   HDL 52.00 02/17/2019   LDLCALC 71 02/17/2019   ALT 14 02/17/2019   AST 16 02/17/2019   NA 140 05/17/2019   K 4.5 05/17/2019   CL 105 05/17/2019   CREATININE 0.85 05/17/2019   BUN 13 05/17/2019   CO2 24 05/17/2019   TSH 1.72 02/17/2019   HGBA1C 6.2 02/17/2019   MICROALBUR 1.1 02/17/2019   DG Bone Density  Result Date: 05/14/2019 EXAM: DUAL X-RAY ABSORPTIOMETRY (DXA) FOR BONE MINERAL DENSITY IMPRESSION: Referring Physician:  [place] Your patient completed a BMD test using Lunar IDXA DXA system ( analysis version: 16 ) manufactured by [employer]. Technologist: WLS PATIENT: Name: Swan, Werne Patient ID:  [id number] Birth Date: April 17, 1955 Height: 70.0 in. Sex: Female Measured: 05/14/2019 Weight: 335.8 lbs. Indications: Estrogen Deficient, Low Calcium Intake (269.3), Postmenopausal Fractures: None Treatments: Vitamin D (E933.5) ASSESSMENT: The BMD measured at AP Spine L1-L4 is 1.194 g/cm2 with a T-score of 0.1. This patient is considered normal according to [place] [facility]) criteria. The scan quality is limited by patient body habitus. Site Region Measured Date Measured Age YA BMD Significant CHANGE T-score AP Spine  L1-L4      3/14[age]    65.3         0.1     1.194 g/cm2 DualFemur Neck Left  05/14/2019    65.3         0.2     1.069 g/cm2 DualFemur Total Mean 3/14/2019    65.3         0.9     1.122 g/cm2 World Health Organization [facility]) criteria for post-menopausal, Caucasian Women: Normal       T-score at or above -1 SD Osteopenia   T-score between -1 and -2.5 SD Osteoporosis T-score at or below -2.5 SD RECOMMENDATION: 1. All patients should optimize calcium and vitamin D intake. 2. Consider FDA approved medical therapies in postmenopausal women and men aged 36 years and older, based on the following: a. A hip or vertebral (clinical or morphometric) fracture b. T- score < or = -2.5 at the femoral neck or spine after appropriate evaluation to exclude secondary causes c. Low bone mass (T-score between -1.0 and -2.5 at the femoral neck or spine) and a 10 year probability of a hip fracture > or = 3% or a 10 year probability of a major osteoporosis-related fracture > or = 20% based on the [place]-adapted WHO algorithm d. Clinician judgment and/or patient preferences may indicate treatment for people with 10-year fracture probabilities above or below these levels FOLLOW-UP: Patients with diagnosis of osteoporosis or at high risk for fracture should have regular bone mineral density tests. For patients eligible for Medicare, routine testing is allowed once every 2 years. The testing frequency can be increased to  one year for patients who have rapidly progressing disease, those who are receiving or discontinuing medical therapy to restore bone mass, or have additional risk factors. I have reviewed this report and agree with the above findings. [facility] Radiology Electronically Signed   By: Franki Cabot M.D.   On: 05/14/2019 09:34    Assessment & Plan:  This visit occurred during the SARS-CoV-2 public health emergency.  Safety protocols were in place, including screening questions prior to the visit,  additional usage of staff PPE, and extensive cleaning of exam room while observing appropriate contact time as indicated for disinfecting solutions.   Demeisha was seen today for follow-up.  Diagnoses and all orders for this visit:  Essential hypertension -     Basic metabolic panel -     losartan-hydrochlorothiazide (HYZAAR) 100-12.5 MG tablet; Take 1 tablet by mouth daily.  Diuretic-induced hypokalemia -     Basic metabolic panel -     losartan-hydrochlorothiazide (HYZAAR) 100-12.5 MG tablet; Take 1 tablet by mouth daily.  Stage 3a chronic kidney disease   I have discontinued Shelena Gallion's potassium chloride SA, hydrochlorothiazide, losartan, and losartan-hydrochlorothiazide. I am also having her start on losartan-hydrochlorothiazide. Additionally, I am having her maintain her Sphygmomanometer, Cholecalciferol, vitamin C, Co Q-10, B-12, amLODipine, atorvastatin, hydrALAZINE, Contour Next Test, and Bayer Microlet Lancets.  Meds ordered this encounter  Medications  . losartan-hydrochlorothiazide (HYZAAR) 100-12.5 MG tablet    Sig: Take 1 tablet by mouth daily.    Dispense:  90 tablet    Refill:  3    Changed to 100/12.5mg     Order Specific Question:   Supervising Provider    Answer:   Ronnald Nian [id number]    Problem List Items Addressed This Visit      Cardiovascular and Mediastinum   Essential hypertension - Primary    Elevated this morning. Reports she has not taken AM  medications. Does not check BP at home Documented BP during OV with cardiology 11/2018: 126/76 Reviewed past Medical, Social and Family history today. Denies any CP or headache or LE edema or dizziness. BP Readings from Last 3 Encounters:  05/17/19 (!) 158/60  02/17/19 (!) 154/86  01/09/18 140/86   Monitor BP at home 2x/week, 1hr after AM medication Maintain DASH diet and regular exercise. HCTZ dose decrease to 12.5 to avoid repeat hypokalemia. Maintain losartan, amlodipine, and hydralazine F/up in 53months      Relevant Medications   losartan-hydrochlorothiazide (HYZAAR) 100-12.5 MG tablet   Other Relevant Orders   Basic metabolic panel (Completed)     Genitourinary   CKD (chronic kidney disease) stage 3, GFR 30-59 ml/min    Improved renal function BP controlled. BMP Latest Ref Rng & Units 05/17/2019 02/17/2019 10/02/2017  Glucose 70 - 99 mg/dL 127(H) 111(H) 117(H)  BUN 6 - 23 mg/dL 13 20 22   Creatinine 0.40 - 1.20 mg/dL 0.85 1.34(H) 1.25(H)  BUN/Creat Ratio 12 - 28 - - -  Sodium 135 - 145 mEq/L 140 136 140  Potassium 3.5 - 5.1 mEq/L 4.5 3.0(L) 3.5  Chloride 96 - 112 mEq/L 105 98 101  CO2 19 - 32 mEq/L 24 28 30   Calcium 8.4 - 10.5 mg/dL 9.7 9.1 9.4          Other   Diuretic-induced hypokalemia   Relevant Medications   losartan-hydrochlorothiazide (HYZAAR) 100-12.5 MG tablet   Other Relevant Orders   Basic metabolic panel (Completed)      Follow-up: Return in about 6 months (around 11/17/2019) for HTN, DM and , hyperlipidemia (fasting, F2F, 3mins).  Wilfred Lacy, NP

## 2019-06-11 ENCOUNTER — Telehealth: Payer: Self-pay | Admitting: Interventional Cardiology

## 2019-06-11 NOTE — Telephone Encounter (Signed)
Spoke with pt and scheduled her to see Dr. [name] 6/1 at 1040AM.  Pt appreciative for call.

## 2019-06-11 NOTE — Telephone Encounter (Signed)
Patient states she is returning a call from someone about rescheduling her appointment 06/17/2019 with Dr. [name]. She states she was offered an appointment with Dr. [name] 06/15/2019 around 10:00am. I did not see anything available with him that day and offered her 06/15/2019 with Kathyrn Drown, but she refused. She would like to speak with the person who offered her the appointment with Dr. [name].

## 2019-06-15 ENCOUNTER — Ambulatory Visit: Payer: Medicare Other | Admitting: Interventional Cardiology

## 2019-06-15 ENCOUNTER — Other Ambulatory Visit: Payer: Self-pay

## 2019-06-15 ENCOUNTER — Encounter: Payer: Self-pay | Admitting: Interventional Cardiology

## 2019-06-15 VITALS — BP 136/78 | HR 66 | Ht 71.0 in | Wt 336.4 lb

## 2019-06-15 DIAGNOSIS — Z7189 Other specified counseling: Secondary | ICD-10-CM

## 2019-06-15 DIAGNOSIS — I517 Cardiomegaly: Secondary | ICD-10-CM | POA: Diagnosis not present

## 2019-06-15 DIAGNOSIS — E785 Hyperlipidemia, unspecified: Secondary | ICD-10-CM

## 2019-06-15 DIAGNOSIS — I350 Nonrheumatic aortic (valve) stenosis: Secondary | ICD-10-CM | POA: Diagnosis not present

## 2019-06-15 DIAGNOSIS — I10 Essential (primary) hypertension: Secondary | ICD-10-CM

## 2019-06-15 NOTE — Patient Instructions (Signed)
Medication Instructions:  Your physician recommends that you continue on your current medications as directed. Please refer to the Current Medication list given to you today.  *If you need a refill on your cardiac medications before your next appointment, please call your pharmacy*   Lab Work: None If you have labs (blood work) drawn today and your tests are completely normal, you will receive your results only by: [name] Kitchen MyChart Message (if you have MyChart) OR . A paper copy in the mail If you have any lab test that is abnormal or we need to change your treatment, we will call you to review the results.   Testing/Procedures: Your physician has requested that you have an echocardiogram in October or November. Echocardiography is a painless test that uses sound waves to create images of your heart. It provides your doctor with information about the size and shape of your heart and how well your heart's chambers and valves are working. This procedure takes approximately one hour. There are no restrictions for this procedure.     Follow-Up: At [facility], you and your health needs are our priority.  As part of our continuing mission to provide you with exceptional heart care, we have created designated Provider Care Teams.  These Care Teams include your primary Cardiologist (physician) and Advanced Practice Providers (APPs -  Physician Assistants and Nurse Practitioners) who all work together to provide you with the care you need, when you need it.  We recommend signing up for the patient portal called "MyChart".  Sign up information is provided on this After Visit Summary.  MyChart is used to connect with patients for Virtual Visits (Telemedicine).  Patients are able to view lab/test results, encounter notes, upcoming appointments, etc.  Non-urgent messages can be sent to your provider as well.   To learn more about what you can do with MyChart, go to [email or web address].    Your next  appointment:   16 month(s)- October 2022  The format for your next appointment:   In Person  Provider:   You may see Dr. [name] or one of the following Advanced Practice Providers on your designated Care Team:    Truitt Merle, NP  Cecilie Kicks, NP  Kathyrn Drown, NP    Other Instructions

## 2019-06-15 NOTE — Progress Notes (Signed)
Cardiology Office Note:    Date:  06/15/2019   ID:  Rose Miller, DOB 10/31/1955, MRN [id number]  PCP:  Flossie Buffy, NP  Cardiologist:  No primary care provider on file.   Referring MD: Flossie Buffy, NP   Chief Complaint  Patient presents with  . Cardiac Valve Problem    History of Present Illness:    Rose Miller is a 64 y.o. female with a hx of  obesity, severe hypertension, left ventricular hypertrophy, and moderate aortic stenosis.  She is doing relatively well.  She mentions that she has gained weight during the pandemic.  She has some dyspnea on exertion that she feels is related to deconditioning.  She denies orthopnea, PND, chest discomfort, and syncope.  She has occasional palpitations.  Palpitations are not long-lived.  The current medical regimen is being complied with.  Her cardioactive medications are Hyzaar HCT Z, a Apresoline, Norvasc, and Lipitor.  When the echo done in October 2020 by Dr. Beatrix Fetters is compared to the 2019 echocardiogram from [facility], the transaortic valve peak velocity has increased from 3.38 m/s in 2019 to 3.71 m/sec in 2020.  This was discussed with the patient.  We also reviewed the stages of aortic stenosis labeled as mild, moderate, and severe based upon transvalvular velocity.  Has brief episodes of tachycardia.  None persisting longer than seconds.  Past Medical History:  Diagnosis Date  . Allergy   . Arthritis   . Asthma   . Diabetes mellitus without complication ([place])   . Heart murmur   . Hypertension     Past Surgical History:  Procedure Laterality Date  . CESAREAN SECTION  1989  . TONSILLECTOMY AND ADENOIDECTOMY  1960    Current Medications: Current Meds  Medication Sig  . amLODipine (NORVASC) 10 MG tablet Take 1 tablet (10 mg total) by mouth daily.  [name] Kitchen atorvastatin (LIPITOR) 10 MG tablet Take 1 tablet (10 mg total) by mouth daily.  Pleas [name] Microlet Lancets lancets Use when testing glucose as recommended.   . Blood Pressure Monitoring (SPHYGMOMANOMETER) MISC 1 Units by Does not apply route daily.  . Cholecalciferol (EQL VITAMIN D3) 1000 units tablet Take 1,000 Units by mouth daily.  . Coenzyme Q10 (CO Q-10) 100 MG CAPS Take 100 mg by mouth daily.   . Cyanocobalamin (B-12) 2500 MCG TABS Take 1 tablet by mouth daily.   [name] Kitchen glucose blood (CONTOUR NEXT TEST) test strip Test up to 4 times per day as recommended  . hydrALAZINE (APRESOLINE) 25 MG tablet Take 1 tablet (25 mg total) by mouth 2 (two) times daily.  [name] Kitchen losartan-hydrochlorothiazide (HYZAAR) 100-12.5 MG tablet Take 1 tablet by mouth daily.  . vitamin C (ASCORBIC ACID) 500 MG tablet Take 500 mg by mouth daily.      Allergies:   Patient has no known allergies.   Social History   Socioeconomic History  . Marital status: Single    Spouse name: Not on file  . Number of children: Not on file  . Years of education: Not on file  . Highest education level: Not on file  Occupational History  . Not on file  Tobacco Use  . Smoking status: Former Research scientist (life sciences)  . Smokeless tobacco: Never Used  Substance and Sexual Activity  . Alcohol use: No    Alcohol/week: 0.0 standard drinks  . Drug use: No  . Sexual activity: Not on file  Other Topics Concern  . Not on file  Social History Narrative  . Not  on file   Social Determinants of Health   Financial Resource Strain:   . Difficulty of Paying Living Expenses:   Food Insecurity:   . Worried About Charity fundraiser in the Last Year:   . Arboriculturist in the Last Year:   Transportation Needs:   . Film/video editor (Medical):   [name] Kitchen Lack of Transportation (Non-Medical):   Physical Activity:   . Days of Exercise per Week:   . Minutes of Exercise per Session:   Stress:   . Feeling of Stress :   Social Connections:   . Frequency of Communication with Friends and Family:   . Frequency of Social Gatherings with Friends and Family:   . Attends Religious Services:   . Active Member of Clubs or  Organizations:   . Attends Archivist Meetings:   [name] Kitchen Marital Status:      Family History: The patient's family history includes Arthritis in her mother; Cancer in her father and mother; Colon cancer in her father; Hypertension in her mother.  ROS:   Please see the history of present illness.    Does not know her family history relative to vascular disease.  Her father is deceased and she believes he had a heart problem.  She has an older brother who has had an amputation and has a "heart problem".  All other systems reviewed and are negative.  EKGs/Labs/Other Studies Reviewed:    The following studies were reviewed today:  2D Doppler echocardiogram 2019: Study Conclusions   - Left ventricle: The cavity size was normal. There was moderate  concentric hypertrophy. Systolic function was normal. The  estimated ejection fraction was in the range of 60% to 65%. Wall  motion was normal; there were no regional wall motion  abnormalities. Features are consistent with a pseudonormal left  ventricular filling pattern, with concomitant abnormal relaxation  and increased filling pressure (grade 2 diastolic dysfunction).  Doppler parameters are consistent with high ventricular filling  pressure.  - Aortic valve: Valve mobility was restricted. There was moderate  stenosis. There was mild regurgitation. Peak velocity (S): 338  cm/s. Mean gradient (S): 25 mm Hg. Valve area (VTI): 1.21 cm^2.  Valve area (Vmax): 1.14 cm^2. Valve area (Vmean): 1.26 cm^2.  - Mitral valve: Mildly calcified annulus. Transvalvular velocity  was within the normal range. There was no evidence for stenosis.  There was trivial regurgitation.  - Left atrium: The atrium was moderately dilated.  - Right ventricle: The cavity size was normal. Wall thickness was  normal. Systolic function was normal.  - Atrial septum: No defect or patent foramen ovale was identified  by color flow  Doppler.  - Tricuspid valve: There was mild regurgitation.  - Pulmonary arteries: Systolic pressure was mildly increased. PA  peak pressure: 41 mm Hg (S).  - Global longitudinal strain -16.6% (borderline).   ECHOCARDIOGRAM 2020:[facility] Result Narrative  Transthoracic Echocardiography Report (TTE)  Demographics  Patient Name   Rose Miller Gender        Female  Patient Number  [id number]      Race         Black  Visit Number   [id number]    Room Number  Accession Number [id number]     Date of Study    11/09/2018  Date of Birth   10-16-1955    Referring Physician Mathis Bud, MD,  [facility]  Age        12 year(s)    Sonographer     Hood, Josh RDCS,                              RVT                    Interpreting     Mathis Bud, MD,                    Physician      [facility] Procedure Type of Study  TTE procedure: CUS TTE SURFACE COMPLETE ECHO ADULT. Procedure date Date: 11/09/2018 Start: 08:03 AM Technical Quality: Adequate visualization Study Location: Echo Lab Indications: Aortic stenosis. Patient Status: Routine Height: 71 inches Weight: 356.01 pounds BSA: 2.69 m2 BMI: 49.65 kg/m2 Rhythm: Normal sinus rhythm HR: 63 bpm BP: 110/70 mmHg Conclusions Summary RVSP has improved vs. previous echo report from [facility] Moderately calcified aortic valve with reduced mobility. Moderate aortic stenosis. Mean pressure gradient of 30 mmHg. Aortic valve area of 1.14 cm squared. Dimensionless index: 40. Velocities through the aortic valve may be overestimated due to hyperdynamic function. Mild aortic regurgitation. Left ventricle cavity size is normal. Moderate concentric left ventricular hypertrophy. The left ventricular systolic function is hyperdynamic. Ejection fraction is visually estimated at  >70%. Diastolic function is indeterminate. No regional wall motion abnormalities noted. Signature ------------------------------------------------------------------------------  Electronically signed by Mathis Bud, MD, FACC(Interpreting physician) on  11/09/2018 10:08 AM ------------------------------------------------------------------------------ Findings Mitral Valve Mild mitral annular calcification. The mitral valve leaflets are pliable and mobile. No evidence of mitral stenosis. Trace mitral regurgitation. Aortic Valve Moderately calcified aortic valve with reduced mobility. Moderate aortic stenosis. Mean pressure gradient of 30 mmHg. Aortic valve area of 1.14 cm squared. Dimensionless index: 40. Velocities through the aortic valve may be overestimated due to hyperdynamic function. Mild aortic regurgitation. Tricuspid Valve Tricuspid valve is structurally normal. No evidence of tricuspid stenosis. Trace tricuspid regurgitation. Insufficient tricuspid regurgitation detected to calculate RV systolic pressure. Pulmonic Valve Pulmonic valve was not well visualized, but is grossly normal. No Doppler evidence of pulmonic stenosis or insufficiency. Left Atrium Normal size left atrium. Left atrial volume index of 27 ml per meters squared BSA. Left Ventricle Left ventricle cavity size is normal. Moderate concentric left ventricular hypertrophy. The left ventricular systolic function is hyperdynamic. Ejection fraction is visually estimated at >70%. Diastolic function is indeterminate. No regional wall motion abnormalities noted. Right Atrium Normal size right atrium. Right Ventricle Normal right ventricular size and function. Pericardial Effusion No evidence of pericardial effusion. Pleural Effusion No pleural effusion seen. Miscellaneous The aortic root diameter is within normal limits. The IVC is normal. The interatrial septum appears intact with no evidence of  shunt by color flow Doppler. M-Mode/2D Measurements & Calculations  LV Diastolic Dimension: LV Systolic Dimension:  AV Cusp Separation: 1.2  4.8 cm          2.7 cm          cmLA Dimension: 3.6 cmAO  LV FS:43.75 %      LV Volume Diastolic: [id number] Root Dimension: 3.4 cmLA  LV PW Diastolic: 1.4 cm ml            Area: 22.8 cm2  Septum Diastolic: 1.4 cm LV Volume Systolic: 29  CO: 123XX123 l/min      ml  CI: 2.21 l/m*m2     LV EDV/LV EDV Index: 107  ml/40 m2LV ESV/LV ESV  RV Diastolic Dimension:  LV Area Diastolic: 0000000 Index: 29 [id number] m2    3.3 cm  cm2           EF Calculated: 72.9 %  LV Area Systolic: 123XX123  EF Estimated: 70 %    LA/Aorta: 1.06  cm2           LV Length: 8.48 cm    Ascending Aorta: 3.7 cm                           LA volume/Index: 71.8 ml              LVOT: 2.2 cm       /58m2 Doppler Measurements & Calculations  MV Peak E-Wave: 93.5   AV Peak Velocity: 371 cm/s  LVOT Peak Velocity:  cm/s           AV Peak Gradient: 55.06 mmHg 147 cm/s  MV Peak A-Wave: 92.4   AV Mean Velocity: 253 cm/s  LVOT Mean Velocity:  cm/s           AV Mean Gradient: 30 mmHg   95.6 cm/s  MV E/A Ratio: 1.01    AV VTI: 82.8 cm        LVOT Peak Gradient: 9  MV Peak Gradient: 3.5  AV Area (Continuity):1.14 cm2 mmHgLVOT Mean  mmHg                          Gradient: 4 mmHg              LVOT VTI: 24.8 cm       Estimated RAP:5 mmHg  MV Deceleration Time:  310 msec  MV P1/2t: 91 msec  MVA by PHT2.42 cm2  TDI E' Velocity: 4.9  cm/s  E/E' prime 19     EKG:  EKG sinus rhythm, prominent voltage, Q waves 1 and aVL.  When compared to prior tracings no significant changes occurred.  Recent Labs: 02/17/2019: ALT 14; Hemoglobin 11.5; Platelets 243.0; TSH 1.72 05/17/2019: BUN 13;  Creatinine, Ser 0.85; Potassium 4.5; Sodium 140  Recent Lipid Panel    Component Value Date/Time   CHOL 142 02/17/2019 0902   TRIG 94.0 02/17/2019 0902   HDL 52.00 02/17/2019 0902   CHOLHDL 3 02/17/2019 0902   VLDL 18.8 02/17/2019 0902   LDLCALC 71 02/17/2019 0902    Physical Exam:    VS:  BP 136/78   Pulse 66   Ht 5\' 11"  (1.803 m)   Wt (!) 336 lb 6.4 oz (152.6 kg)   SpO2 98%   BMI 46.92 kg/m     Wt Readings from Last 3 Encounters:  06/15/19 (!) 336 lb 6.4 oz (152.6 kg)  05/17/19 (!) 335 lb 12.8 oz (152.3 kg)  02/17/19 (!) 327 lb 6.4 oz (148.5 kg)     GEN: Obese. No acute distress HEENT: Normal NECK: No JVD. LYMPHATICS: No lymphadenopathy CARDIAC: 3/6 crescendo decrescendo systolic murmur.  RRR without diastolic murmur, gallop, or edema. VASCULAR:  Normal Pulses. No bruits. RESPIRATORY:  Clear to auscultation without rales, wheezing or rhonchi  ABDOMEN: Soft, non-tender, non-distended, No pulsatile mass, MUSCULOSKELETAL: No deformity  SKIN: Warm and dry NEUROLOGIC:  Alert and oriented x 3 PSYCHIATRIC:  Normal affect   ASSESSMENT:    1. Aortic stenosis, moderate   2. Essential hypertension   3. Hyperlipidemia LDL goal <70   4. Severe concentric left  ventricular hypertrophy   5. Morbid obesity ([place])   6. Educated about COVID-19 virus infection    PLAN:    In order of problems listed above:  1. Still in the moderate stenosis range.  Monitor for symptoms.  Repeat echo October 2021.  Call if cardinal symptoms. 2. Blood pressure is under good control. 3. Lipids need to be managed LDL goal of 70 or less. 4. Noted on echo in 2019 and 2020 with wall thickness in the 1.4 cm range. 5. Discussed aerobic activity to help with weight control 6. Has received the COVID-19 vaccine.   Brief discussion of the natural history of aortic stenosis.  Brief discussion of therapies which could include open heart surgery or percutaneous transvalvular replacement.  Discussed  cardinal symptoms and need to be reported (syncope, dyspnea/exertional fatigue, and chest pain.   Medication Adjustments/Labs and Tests Ordered: Current medicines are reviewed at length with the patient today.  Concerns regarding medicines are outlined above.  Orders Placed This Encounter  Procedures  . EKG 12-Lead  . ECHOCARDIOGRAM COMPLETE   No orders of the defined types were placed in this encounter.   Patient Instructions  Medication Instructions:  Your physician recommends that you continue on your current medications as directed. Please refer to the Current Medication list given to you today.  *If you need a refill on your cardiac medications before your next appointment, please call your pharmacy*   Lab Work: None If you have labs (blood work) drawn today and your tests are completely normal, you will receive your results only by: [name] Kitchen MyChart Message (if you have MyChart) OR . A paper copy in the mail If you have any lab test that is abnormal or we need to change your treatment, we will call you to review the results.   Testing/Procedures: Your physician has requested that you have an echocardiogram in October or November. Echocardiography is a painless test that uses sound waves to create images of your heart. It provides your doctor with information about the size and shape of your heart and how well your heart's chambers and valves are working. This procedure takes approximately one hour. There are no restrictions for this procedure.     Follow-Up: At [facility], you and your health needs are our priority.  As part of our continuing mission to provide you with exceptional heart care, we have created designated Provider Care Teams.  These Care Teams include your primary Cardiologist (physician) and Advanced Practice Providers (APPs -  Physician Assistants and Nurse Practitioners) who all work together to provide you with the care you need, when you need it.  We  recommend signing up for the patient portal called "MyChart".  Sign up information is provided on this After Visit Summary.  MyChart is used to connect with patients for Virtual Visits (Telemedicine).  Patients are able to view lab/test results, encounter notes, upcoming appointments, etc.  Non-urgent messages can be sent to your provider as well.   To learn more about what you can do with MyChart, go to [email or web address].    Your next appointment:   16 month(s)- October 2022  The format for your next appointment:   In Person  Provider:   You may see Dr. [name] or one of the following Advanced Practice Providers on your designated Care Team:    Truitt Merle, NP  Cecilie Kicks, NP  Kathyrn Drown, NP    Other Instructions      Signed, Belva Crome III,  MD  06/15/2019 12:00 PM    [place]

## 2019-06-17 ENCOUNTER — Ambulatory Visit: Payer: Medicare Other | Admitting: Interventional Cardiology

## 2019-08-11 ENCOUNTER — Telehealth: Payer: Self-pay | Admitting: Nurse Practitioner

## 2019-08-11 NOTE — Telephone Encounter (Signed)
Patient is calling and requesting refills for losartan- hydrochlorothiazide sent to [facility], please advise. CB is [phone]

## 2019-08-13 ENCOUNTER — Other Ambulatory Visit: Payer: Self-pay

## 2019-08-13 DIAGNOSIS — I10 Essential (primary) hypertension: Secondary | ICD-10-CM

## 2019-08-13 DIAGNOSIS — E876 Hypokalemia: Secondary | ICD-10-CM

## 2019-08-13 DIAGNOSIS — T502X5A Adverse effect of carbonic-anhydrase inhibitors, benzothiadiazides and other diuretics, initial encounter: Secondary | ICD-10-CM

## 2019-08-13 MED ORDER — LOSARTAN POTASSIUM-HCTZ 100-12.5 MG PO TABS: 1.0000 | ORAL_TABLET | Freq: Every day | ORAL | 3 refills | Status: DC

## 2019-08-16 NOTE — Telephone Encounter (Signed)
Pt refill for Losartan-hydrocholorothiazide was sent in 08/13/19 to [facility], tried calling to make sure the pt was aware but sent a My Chart message to inform her.

## 2019-10-28 ENCOUNTER — Other Ambulatory Visit: Payer: Self-pay | Admitting: Nurse Practitioner

## 2019-10-28 DIAGNOSIS — Z1231 Encounter for screening mammogram for malignant neoplasm of breast: Secondary | ICD-10-CM

## 2019-11-02 ENCOUNTER — Other Ambulatory Visit: Payer: Self-pay

## 2019-11-02 ENCOUNTER — Ambulatory Visit (HOSPITAL_COMMUNITY): Payer: Medicare Other | Attending: Cardiovascular Disease

## 2019-11-02 DIAGNOSIS — I350 Nonrheumatic aortic (valve) stenosis: Secondary | ICD-10-CM

## 2019-11-02 LAB — ECHOCARDIOGRAM COMPLETE
AV Mean grad: 37 mmHg
AV Peak grad: 62.7 mmHg
Ao pk vel: 3.96 m/s
Area-P 1/2: 3.03 cm2
P 1/2 time: 428 msec
S' Lateral: 3 cm

## 2019-11-04 ENCOUNTER — Telehealth: Payer: Self-pay

## 2019-11-04 DIAGNOSIS — I517 Cardiomegaly: Secondary | ICD-10-CM

## 2019-11-04 DIAGNOSIS — I350 Nonrheumatic aortic (valve) stenosis: Secondary | ICD-10-CM

## 2019-11-04 NOTE — Telephone Encounter (Signed)
-----   Message from Belva Crome, MD sent at 11/03/2019  5:44 PM EDT ----- Let the patient know the aortic valve has progressed to severe stenosis.  Rather than a 28-month follow-up it should be 6 months.  Notify us if any symptoms of shortness of breath, chest pain, syncope, or heart racing without precipitant. A copy will be sent to [name], [name], NP

## 2019-11-04 NOTE — Telephone Encounter (Signed)
Pt verbalized understanding of her Echo results and will let us know if she develops any problems.   Will plan for her repeat in 6 months but will call if she needs [place] sooner.

## 2019-11-17 ENCOUNTER — Encounter (INDEPENDENT_AMBULATORY_CARE_PROVIDER_SITE_OTHER): Payer: Self-pay

## 2019-11-17 ENCOUNTER — Other Ambulatory Visit: Payer: Self-pay

## 2019-11-17 ENCOUNTER — Ambulatory Visit (INDEPENDENT_AMBULATORY_CARE_PROVIDER_SITE_OTHER): Payer: Medicare Other | Admitting: Nurse Practitioner

## 2019-11-17 ENCOUNTER — Encounter: Payer: Self-pay | Admitting: Nurse Practitioner

## 2019-11-17 VITALS — BP 136/80 | HR 77 | Temp 96.1°F | Ht 71.0 in | Wt 348.3 lb

## 2019-11-17 DIAGNOSIS — E7800 Pure hypercholesterolemia, unspecified: Secondary | ICD-10-CM | POA: Diagnosis not present

## 2019-11-17 DIAGNOSIS — I10 Essential (primary) hypertension: Secondary | ICD-10-CM | POA: Diagnosis not present

## 2019-11-17 DIAGNOSIS — E1169 Type 2 diabetes mellitus with other specified complication: Secondary | ICD-10-CM

## 2019-11-17 DIAGNOSIS — N1831 Chronic kidney disease, stage 3a: Secondary | ICD-10-CM

## 2019-11-17 DIAGNOSIS — Z6841 Body Mass Index (BMI) 40.0 and over, adult: Secondary | ICD-10-CM

## 2019-11-17 LAB — BASIC METABOLIC PANEL
BUN: 15 mg/dL (ref 6–23)
CO2: 30 mEq/L (ref 19–32)
Calcium: 9.3 mg/dL (ref 8.4–10.5)
Chloride: 103 mEq/L (ref 96–112)
Creatinine, Ser: 1.22 mg/dL — ABNORMAL HIGH (ref 0.40–1.20)
GFR: 46.45 mL/min — ABNORMAL LOW (ref >60.00)
Glucose, Bld: 110 mg/dL — ABNORMAL HIGH (ref 70–99)
Potassium: 3.5 mEq/L (ref 3.5–5.1)
Sodium: 140 mEq/L (ref 135–145)

## 2019-11-17 LAB — HEMOGLOBIN A1C: Hgb A1c MFr Bld: 6.7 % — ABNORMAL HIGH (ref 4.6–6.5)

## 2019-11-17 LAB — LIPID PANEL
Cholesterol: 125 mg/dL (ref 0–200)
HDL: 51.5 mg/dL (ref >39.00)
LDL Cholesterol: 55 mg/dL (ref 0–99)
NonHDL: 73.25
Total CHOL/HDL Ratio: 2
Triglycerides: 90 mg/dL (ref 0.0–149.0)
VLDL: 18 mg/dL (ref 0.0–40.0)

## 2019-11-17 NOTE — Patient Instructions (Signed)
Go to lab for blood draw  Call weight loss clinic to schedule an appt: [place] stands for "Dietary Approaches to Stop Hypertension." The DASH eating plan is a healthy eating plan that has been shown to reduce high blood pressure (hypertension). It may also reduce your risk for type 2 diabetes, heart disease, and stroke. The DASH eating plan may also help with weight loss. What are tips for following this plan?  General guidelines  Avoid eating more than 2,300 mg (milligrams) of salt (sodium) a day. If you have hypertension, you may need to reduce your sodium intake to 1,500 mg a day.  Limit alcohol intake to no more than 1 drink a day for nonpregnant women and 2 drinks a day for men. One drink equals 12 oz of beer, 5 oz of wine, or 1 oz of hard liquor.  Work with your health care provider to maintain a healthy body weight or to lose weight. Ask what an ideal weight is for you.  Get at least 30 minutes of exercise that causes your heart to beat faster (aerobic exercise) most days of the week. Activities may include walking, swimming, or biking.  Work with your health care provider or diet and nutrition specialist (dietitian) to adjust your eating plan to your individual calorie needs. Reading food labels   Check food labels for the amount of sodium per serving. Choose foods with less than 5 percent of the Daily Value of sodium. Generally, foods with less than 300 mg of sodium per serving fit into this eating plan.  To find whole grains, look for the word "whole" as the first word in the ingredient list. Shopping  Buy products labeled as "low-sodium" or "no salt added."  Buy fresh foods. Avoid canned foods and premade or frozen meals. Cooking  Avoid adding salt when cooking. Use salt-free seasonings or herbs instead of table salt or sea salt. Check with your health care provider or pharmacist before using salt substitutes.  Do not fry foods. Cook foods  using healthy methods such as baking, boiling, grilling, and broiling instead.  Cook with heart-healthy oils, such as olive, canola, soybean, or sunflower oil. Meal planning  Eat a balanced diet that includes: ? 5 or more servings of fruits and vegetables each day. At each meal, try to fill half of your plate with fruits and vegetables. ? Up to 6-8 servings of whole grains each day. ? Less than 6 oz of lean meat, poultry, or fish each day. A 3-oz serving of meat is about the same size as a deck of cards. One egg equals 1 oz. ? 2 servings of low-fat dairy each day. ? A serving of nuts, seeds, or beans 5 times each week. ? Heart-healthy fats. Healthy fats called Omega-3 fatty acids are found in foods such as flaxseeds and coldwater fish, like sardines, salmon, and mackerel.  Limit how much you eat of the following: ? Canned or prepackaged foods. ? Food that is high in trans fat, such as fried foods. ? Food that is high in saturated fat, such as fatty meat. ? Sweets, desserts, sugary drinks, and other foods with added sugar. ? Full-fat dairy products.  Do not salt foods before eating.  Try to eat at least 2 vegetarian meals each week.  Eat more home-cooked food and less restaurant, buffet, and fast food.  When eating at a restaurant, ask that your food be prepared with less salt or no salt,  if possible. What foods are recommended? The items listed may not be a complete list. Talk with your dietitian about what dietary choices are best for you. Grains Whole-grain or whole-wheat bread. Whole-grain or whole-wheat pasta. Brown rice. [place]. Bulgur. Whole-grain and low-sodium cereals. Pita bread. Low-fat, low-sodium crackers. Whole-wheat flour tortillas. Vegetables Fresh or frozen vegetables (raw, steamed, roasted, or grilled). Low-sodium or reduced-sodium tomato and vegetable juice. Low-sodium or reduced-sodium tomato sauce and tomato paste. Low-sodium or reduced-sodium canned  vegetables. Fruits All fresh, dried, or frozen fruit. Canned fruit in natural juice (without added sugar). Meat and other protein foods Skinless chicken or [place]. Ground chicken or [place]. Pork with fat trimmed off. Fish and seafood. Egg whites. Dried beans, peas, or lentils. Unsalted nuts, nut butters, and seeds. Unsalted canned beans. Lean cuts of beef with fat trimmed off. Low-sodium, lean deli meat. Dairy Low-fat (1%) or fat-free (skim) milk. Fat-free, low-fat, or reduced-fat cheeses. Nonfat, low-sodium ricotta or cottage cheese. Low-fat or nonfat yogurt. Low-fat, low-sodium cheese. Fats and oils Soft margarine without trans fats. Vegetable oil. Low-fat, reduced-fat, or light mayonnaise and salad dressings (reduced-sodium). Canola, safflower, olive, soybean, and sunflower oils. Avocado. Seasoning and other foods Herbs. Spices. Seasoning mixes without salt. Unsalted popcorn and pretzels. Fat-free sweets. What foods are not recommended? The items listed may not be a complete list. Talk with your dietitian about what dietary choices are best for you. Grains Baked goods made with fat, such as croissants, muffins, or some breads. Dry pasta or rice meal packs. Vegetables Creamed or fried vegetables. Vegetables in a cheese sauce. Regular canned vegetables (not low-sodium or reduced-sodium). Regular canned tomato sauce and paste (not low-sodium or reduced-sodium). Regular tomato and vegetable juice (not low-sodium or reduced-sodium). Angie Fava. Olives. Fruits Canned fruit in a light or heavy syrup. Fried fruit. Fruit in cream or butter sauce. Meat and other protein foods Fatty cuts of meat. Ribs. Fried meat. Berniece Salines. Sausage. Bologna and other processed lunch meats. Salami. Fatback. Hotdogs. Bratwurst. Salted nuts and seeds. Canned beans with added salt. Canned or smoked fish. Whole eggs or egg yolks. Chicken or [place] with skin. Dairy Whole or 2% milk, cream, and half-and-half. Whole or full-fat  cream cheese. Whole-fat or sweetened yogurt. Full-fat cheese. Nondairy creamers. Whipped toppings. Processed cheese and cheese spreads. Fats and oils Butter. Stick margarine. Lard. Shortening. Ghee. Bacon fat. Tropical oils, such as coconut, palm kernel, or palm oil. Seasoning and other foods Salted popcorn and pretzels. Onion salt, garlic salt, seasoned salt, table salt, and sea salt. Worcestershire sauce. Tartar sauce. Barbecue sauce. Teriyaki sauce. Soy sauce, including reduced-sodium. Steak sauce. Canned and packaged gravies. Fish sauce. Oyster sauce. Cocktail sauce. Horseradish that you find on the shelf. Ketchup. Mustard. Meat flavorings and tenderizers. Bouillon cubes. Hot sauce and Tabasco sauce. Premade or packaged marinades. Premade or packaged taco seasonings. Relishes. Regular salad dressings. Where to find more information:  National Heart, Lung, and [place]: https://wilson-eaton.com/  American Heart Association: www.heart.org Summary  The DASH eating plan is a healthy eating plan that has been shown to reduce high blood pressure (hypertension). It may also reduce your risk for type 2 diabetes, heart disease, and stroke.  With the DASH eating plan, you should limit salt (sodium) intake to 2,300 mg a day. If you have hypertension, you may need to reduce your sodium intake to 1,500 mg a day.  When on the DASH eating plan, aim to eat more fresh fruits and vegetables, whole grains, lean proteins, low-fat dairy, and heart-healthy fats.  Work with your health care provider or diet and nutrition specialist (dietitian) to adjust your eating plan to your individual calorie needs. This information is not intended to replace advice given to you by your health care provider. Make sure you discuss any questions you have with your health care provider. Document Revised: 12/13/2016 Document Reviewed: 12/25/2015 Elsevier Patient Education  2020 [employer].   Exercising to Lose  Weight Exercise is structured, repetitive physical activity to improve fitness and health. Getting regular exercise is important for everyone. It is especially important if you are overweight. Being overweight increases your risk of heart disease, stroke, diabetes, high blood pressure, and several types of cancer. Reducing your calorie intake and exercising can help you lose weight. Exercise is usually categorized as moderate or vigorous intensity. To lose weight, most people need to do a certain amount of moderate-intensity or vigorous-intensity exercise each week. Moderate-intensity exercise  Moderate-intensity exercise is any activity that gets you moving enough to burn at least three times more energy (calories) than if you were sitting. Examples of moderate exercise include:  Walking a mile in 15 minutes.  Doing light yard work.  Biking at an easy pace. Most people should get at least 150 minutes (2 hours and 30 minutes) a week of moderate-intensity exercise to maintain their body weight. Vigorous-intensity exercise Vigorous-intensity exercise is any activity that gets you moving enough to burn at least six times more calories than if you were sitting. When you exercise at this intensity, you should be working hard enough that you are not able to carry on a conversation. Examples of vigorous exercise include:  Running.  Playing a team sport, such as football, basketball, and soccer.  Jumping rope. Most people should get at least 75 minutes (1 hour and 15 minutes) a week of vigorous-intensity exercise to maintain their body weight. How can exercise affect me? When you exercise enough to burn more calories than you eat, you lose weight. Exercise also reduces body fat and builds muscle. The more muscle you have, the more calories you burn. Exercise also:  Improves mood.  Reduces stress and tension.  Improves your overall fitness, flexibility, and endurance.  Increases bone  strength. The amount of exercise you need to lose weight depends on:  Your age.  The type of exercise.  Any health conditions you have.  Your overall physical ability. Talk to your health care provider about how much exercise you need and what types of activities are safe for you. What actions can I take to lose weight? Nutrition   Make changes to your diet as told by your health care provider or diet and nutrition specialist (dietitian). This may include: ? Eating fewer calories. ? Eating more protein. ? Eating less unhealthy fats. ? Eating a diet that includes fresh fruits and vegetables, whole grains, low-fat dairy products, and lean protein. ? Avoiding foods with added fat, salt, and sugar.  Drink plenty of water while you exercise to prevent dehydration or heat stroke. Activity  Choose an activity that you enjoy and set realistic goals. Your health care provider can help you make an exercise plan that works for you.  Exercise at a moderate or vigorous intensity most days of the week. ? The intensity of exercise may vary from person to person. You can tell how intense a workout is for you by paying attention to your breathing and heartbeat. Most people will notice their breathing and heartbeat get faster with more intense exercise.  Do resistance  training twice each week, such as: ? Push-ups. ? Sit-ups. ? Lifting weights. ? Using resistance bands.  Getting short amounts of exercise can be just as helpful as long structured periods of exercise. If you have trouble finding time to exercise, try to include exercise in your daily routine. ? Get up, stretch, and walk around every 30 minutes throughout the day. ? Go for a walk during your lunch break. ? Park your car farther away from your destination. ? If you take public transportation, get off one stop early and walk the rest of the way. ? Make phone calls while standing up and walking around. ? Take the stairs instead of  elevators or escalators.  Wear comfortable clothes and shoes with good support.  Do not exercise so much that you hurt yourself, feel dizzy, or get very short of breath. Where to find more information  U.S. Department of Health and Human Services: BondedCompany.at  Centers for Disease Control and Prevention (CDC): http://www.wolf.info/ Contact a health care provider:  Before starting a new exercise program.  If you have questions or concerns about your weight.  If you have a medical problem that keeps you from exercising. Get help right away if you have any of the following while exercising:  Injury.  Dizziness.  Difficulty breathing or shortness of breath that does not go away when you stop exercising.  Chest pain.  Rapid heartbeat. Summary  Being overweight increases your risk of heart disease, stroke, diabetes, high blood pressure, and several types of cancer.  Losing weight happens when you burn more calories than you eat.  Reducing the amount of calories you eat in addition to getting regular moderate or vigorous exercise each week helps you lose weight. This information is not intended to replace advice given to you by your health care provider. Make sure you discuss any questions you have with your health care provider. Document Revised: 01/13/2017 Document Reviewed: 01/13/2017 Elsevier Patient Education  2020 [employer].

## 2019-11-17 NOTE — Progress Notes (Signed)
Subjective:  Patient ID: Rose Miller, female    DOB: 1955/02/20  Age: 64 y.o. MRN: [id number]  CC: Follow-up (6 month f/u on DM, HTN, and cholesterol. Pt is fasting. Flu vaccine declined.)  HPI  Morbid obesity ([place]) Denies any anxiety or depression. Reports she is unable to maintain heart healthy diet and regular exercise. She did not schedule appt with weight management clinic as previous discussed. She states she will this time. Provided telephone number.  DM (diabetes mellitus) ([place]) Last hgbA1c of 6.2 LDL at goal with atorvastatin Stable LE neuropathy Negative urine microalbumin, she is aware of necessary foot care BP at goal Declined any immunization Advised to schedule annual diabetic exam  Repeat hgbA1c and lipid panel: Stable renal function and lipid panel Increase in HgbA1c 6.2 to 6.7.would you consider taking daily metformin or weekly ozempic injections?   Essential hypertension BP at goal with amlodipine, hydralazine, and losartan-hctz. BP Readings from Last 3 Encounters:  11/17/19 136/80  06/15/19 136/78  05/17/19 (!) 158/60   Repeat BMP: stable Maintain current medications  Wt Readings from Last 3 Encounters:  11/17/19 (!) 348 lb 4.8 oz (158 kg)  06/15/19 (!) 336 lb 6.4 oz (152.6 kg)  05/17/19 (!) 335 lb 12.8 oz (152.3 kg)    Reviewed past Medical, Social and Family history today.  Outpatient Medications Prior to Visit  Medication Sig Dispense Refill   amLODipine (NORVASC) 10 MG tablet Take 1 tablet (10 mg total) by mouth daily. 90 tablet 3   atorvastatin (LIPITOR) 10 MG tablet Take 1 tablet (10 mg total) by mouth daily. 90 tablet 3   Bayer Microlet Lancets lancets Use when testing glucose as recommended.     Blood Pressure Monitoring (SPHYGMOMANOMETER) MISC 1 Units by Does not apply route daily. 1 each 0   Cholecalciferol (EQL VITAMIN D3) 1000 units tablet Take 1,000 Units by mouth daily.     Coenzyme Q10 (CO Q-10) 100 MG CAPS Take 100 mg by  mouth daily.      Cyanocobalamin (B-12) 2500 MCG TABS Take 1 tablet by mouth daily.      glucose blood (CONTOUR NEXT TEST) test strip Test up to 4 times per day as recommended     hydrALAZINE (APRESOLINE) 25 MG tablet Take 1 tablet (25 mg total) by mouth 2 (two) times daily. 180 tablet 3   losartan-hydrochlorothiazide (HYZAAR) 100-12.5 MG tablet Take 1 tablet by mouth daily. 90 tablet 3   vitamin C (ASCORBIC ACID) 500 MG tablet Take 500 mg by mouth daily.      Zinc 50 MG CAPS Take by mouth.     No facility-administered medications prior to visit.    ROS See HPI  Objective:  BP 136/80 (BP Location: Left Arm, Patient Position: Sitting, Cuff Size: Large)    Pulse 77    Temp (!) 96.1 F (35.6 C) (Temporal)    Ht 5\' 11"  (1.803 m)    Wt (!) 348 lb 4.8 oz (158 kg)    SpO2 98%    BMI 48.58 kg/m   Physical Exam Constitutional:      Appearance: She is obese.  Cardiovascular:     Rate and Rhythm: Normal rate and regular rhythm.     Pulses: Normal pulses.     Heart sounds: Normal heart sounds.  Pulmonary:     Effort: Pulmonary effort is normal.     Breath sounds: Normal breath sounds.  Musculoskeletal:     Right lower leg: No edema.  Left lower leg: No edema.  Skin:    General: Skin is warm and dry.  Neurological:     Mental Status: She is alert and oriented to person, place, and time.  Psychiatric:        Mood and Affect: Mood normal.        Behavior: Behavior normal.        Thought Content: Thought content normal.    Assessment & Plan:  This visit occurred during the SARS-CoV-2 public health emergency.  Safety protocols were in place, including screening questions prior to the visit, additional usage of staff PPE, and extensive cleaning of exam room while observing appropriate contact time as indicated for disinfecting solutions.   Avary was seen today for follow-up.  Diagnoses and all orders for this visit:  Essential hypertension  Morbid obesity ([place]) -     Amb  Ref to Medical Weight Management  Stage 3a chronic kidney disease ([place]) -     Basic metabolic panel  Type 2 diabetes mellitus with other specified complication, without long-term current use of insulin (H[facility]) -     Amb Ref to Medical Weight Management -     Hemoglobin A1c  Hypercholesterolemia -     Amb Ref to Medical Weight Management -     Lipid panel  BMI 45.0-49.9, adult ([place])    Problem List Items Addressed This Visit      Cardiovascular and Mediastinum   Essential hypertension - Primary    BP at goal with amlodipine, hydralazine, and losartan-hctz. BP Readings from Last 3 Encounters:  11/17/19 136/80  06/15/19 136/78  05/17/19 (!) 158/60   Repeat BMP: stable Maintain current medications        Endocrine   DM (diabetes mellitus) ([place])    Last hgbA1c of 6.2 LDL at goal with atorvastatin Stable LE neuropathy Negative urine microalbumin, she is aware of necessary foot care BP at goal Declined any immunization Advised to schedule annual diabetic exam  Repeat hgbA1c and lipid panel: Stable renal function and lipid panel Increase in HgbA1c 6.2 to 6.7.would you consider taking daily metformin or weekly ozempic injections?       Relevant Orders   Amb Ref to Medical Weight Management   Hemoglobin A1c (Completed)     Genitourinary   CKD (chronic kidney disease) stage 3, GFR 30-59 ml/min (HCC)   Relevant Orders   Basic metabolic panel (Completed)     Other   Hypercholesterolemia   Relevant Orders   Amb Ref to Medical Weight Management   Lipid panel (Completed)   Morbid obesity ([place])    Denies any anxiety or depression. Reports she is unable to maintain heart healthy diet and regular exercise. She did not schedule appt with weight management clinic as previous discussed. She states she will this time. Provided telephone number.      Relevant Orders   Amb Ref to Medical Weight Management    Other Visit Diagnoses    BMI 45.0-49.9, adult ([place])           Follow-up: Return in about 6 months (around 05/16/2020) for DM and HTN, hyperlipidemia (fasting, ask about PAP).  Wilfred Lacy, NP

## 2019-11-17 NOTE — Assessment & Plan Note (Signed)
Denies any anxiety or depression. Reports she is unable to maintain heart healthy diet and regular exercise. She did not schedule appt with weight management clinic as previous discussed. She states she will this time. Provided telephone number.

## 2019-11-17 NOTE — Assessment & Plan Note (Addendum)
Last hgbA1c of 6.2 LDL at goal with atorvastatin Stable LE neuropathy Negative urine microalbumin, she is aware of necessary foot care BP at goal Declined any immunization Advised to schedule annual diabetic exam  Repeat hgbA1c and lipid panel: Stable renal function and lipid panel Increase in HgbA1c 6.2 to 6.7.would you consider taking daily metformin or weekly ozempic injections?

## 2019-11-17 NOTE — Assessment & Plan Note (Addendum)
BP at goal with amlodipine, hydralazine, and losartan-hctz. BP Readings from Last 3 Encounters:  11/17/19 136/80  06/15/19 136/78  05/17/19 (!) 158/60   Repeat BMP: stable Maintain current medications

## 2019-12-02 ENCOUNTER — Ambulatory Visit
Admission: RE | Admit: 2019-12-02 | Discharge: 2019-12-02 | Disposition: A | Discharge status: Home / Self Care | Payer: Medicare Other | Source: Ambulatory Visit | Attending: Nurse Practitioner | Admitting: Nurse Practitioner

## 2019-12-02 ENCOUNTER — Other Ambulatory Visit: Payer: Self-pay

## 2019-12-02 DIAGNOSIS — Z1231 Encounter for screening mammogram for malignant neoplasm of breast: Secondary | ICD-10-CM | POA: Diagnosis not present

## 2020-01-18 ENCOUNTER — Other Ambulatory Visit: Payer: Self-pay | Admitting: Nurse Practitioner

## 2020-01-18 DIAGNOSIS — I10 Essential (primary) hypertension: Secondary | ICD-10-CM

## 2020-01-19 ENCOUNTER — Ambulatory Visit (INDEPENDENT_AMBULATORY_CARE_PROVIDER_SITE_OTHER): Payer: Self-pay | Admitting: Family Medicine

## 2020-01-19 NOTE — Telephone Encounter (Signed)
Last OV 11/17/19 Last 02/18/19  #180/3  #90/3

## 2020-01-28 ENCOUNTER — Encounter: Payer: Self-pay | Admitting: Internal Medicine

## 2020-02-01 ENCOUNTER — Telehealth: Payer: Self-pay | Admitting: Nurse Practitioner

## 2020-02-01 NOTE — Telephone Encounter (Signed)
Left message for patient to schedule Annual Wellness Visit.  Please schedule with Nurse Health Advisor Martha Stanley, RN at [facility]  °

## 2020-02-02 ENCOUNTER — Encounter (INDEPENDENT_AMBULATORY_CARE_PROVIDER_SITE_OTHER): Payer: Self-pay | Admitting: Family Medicine

## 2020-02-02 ENCOUNTER — Ambulatory Visit (INDEPENDENT_AMBULATORY_CARE_PROVIDER_SITE_OTHER): Payer: Medicare Other | Admitting: Family Medicine

## 2020-02-02 ENCOUNTER — Ambulatory Visit (INDEPENDENT_AMBULATORY_CARE_PROVIDER_SITE_OTHER): Payer: Self-pay | Admitting: Family Medicine

## 2020-02-02 ENCOUNTER — Other Ambulatory Visit: Payer: Self-pay

## 2020-02-02 VITALS — BP 134/73 | HR 89 | Temp 98.5°F | Ht 70.0 in | Wt 336.0 lb

## 2020-02-02 DIAGNOSIS — E559 Vitamin D deficiency, unspecified: Secondary | ICD-10-CM

## 2020-02-02 DIAGNOSIS — Z1331 Encounter for screening for depression: Secondary | ICD-10-CM | POA: Diagnosis not present

## 2020-02-02 DIAGNOSIS — E1159 Type 2 diabetes mellitus with other circulatory complications: Secondary | ICD-10-CM

## 2020-02-02 DIAGNOSIS — I152 Hypertension secondary to endocrine disorders: Secondary | ICD-10-CM | POA: Diagnosis not present

## 2020-02-02 DIAGNOSIS — Z6841 Body Mass Index (BMI) 40.0 and over, adult: Secondary | ICD-10-CM

## 2020-02-02 DIAGNOSIS — E66813 Obesity, class 3: Secondary | ICD-10-CM

## 2020-02-02 DIAGNOSIS — E1169 Type 2 diabetes mellitus with other specified complication: Secondary | ICD-10-CM | POA: Diagnosis not present

## 2020-02-02 DIAGNOSIS — E1165 Type 2 diabetes mellitus with hyperglycemia: Secondary | ICD-10-CM | POA: Diagnosis not present

## 2020-02-02 DIAGNOSIS — N1831 Chronic kidney disease, stage 3a: Secondary | ICD-10-CM

## 2020-02-02 DIAGNOSIS — R5383 Other fatigue: Secondary | ICD-10-CM

## 2020-02-02 DIAGNOSIS — R0602 Shortness of breath: Secondary | ICD-10-CM

## 2020-02-02 DIAGNOSIS — E785 Hyperlipidemia, unspecified: Secondary | ICD-10-CM | POA: Diagnosis not present

## 2020-02-02 DIAGNOSIS — Z0289 Encounter for other administrative examinations: Secondary | ICD-10-CM

## 2020-02-03 ENCOUNTER — Encounter (INDEPENDENT_AMBULATORY_CARE_PROVIDER_SITE_OTHER): Payer: Self-pay | Admitting: Family Medicine

## 2020-02-03 LAB — CBC WITH DIFFERENTIAL/PLATELET
Basophils Absolute: 0 10*3/uL (ref 0.0–0.2)
Basos: 1 %
EOS (ABSOLUTE): 0.1 10*3/uL (ref 0.0–0.4)
Eos: 3 %
Hematocrit: 35.2 % (ref 34.0–46.6)
Hemoglobin: 11.4 g/dL (ref 11.1–15.9)
Immature Grans (Abs): 0 10*3/uL (ref 0.0–0.1)
Immature Granulocytes: 0 %
Lymphocytes Absolute: 1.9 10*3/uL (ref 0.7–3.1)
Lymphs: 51 %
MCH: 27.7 pg (ref 26.6–33.0)
MCHC: 32.4 g/dL (ref 31.5–35.7)
MCV: 85 fL (ref 79–97)
Monocytes Absolute: 0.4 10*3/uL (ref 0.1–0.9)
Monocytes: 11 %
Neutrophils Absolute: 1.3 10*3/uL — ABNORMAL LOW (ref 1.4–7.0)
Neutrophils: 34 %
Platelets: 263 10*3/uL (ref 150–450)
RBC: 4.12 x10E6/uL (ref 3.77–5.28)
RDW: 13.8 % (ref 11.7–15.4)
WBC: 3.8 10*3/uL (ref 3.4–10.8)

## 2020-02-03 LAB — COMPREHENSIVE METABOLIC PANEL
ALT: 13 IU/L (ref 0–32)
AST: 15 IU/L (ref 0–40)
Albumin/Globulin Ratio: 1.3 (ref 1.2–2.2)
Albumin: 4.2 g/dL (ref 3.8–4.8)
Alkaline Phosphatase: 68 IU/L (ref 44–121)
BUN/Creatinine Ratio: 10 — ABNORMAL LOW (ref 12–28)
BUN: 11 mg/dL (ref 8–27)
Bilirubin Total: 0.3 mg/dL (ref 0.0–1.2)
CO2: 23 mmol/L (ref 20–29)
Calcium: 9.7 mg/dL (ref 8.7–10.3)
Chloride: 103 mmol/L (ref 96–106)
Creatinine, Ser: 1.1 mg/dL — ABNORMAL HIGH (ref 0.57–1.00)
GFR calc Af Amer: 60 mL/min/{1.73_m2} (ref >59)
GFR calc non Af Amer: 52 mL/min/{1.73_m2} — ABNORMAL LOW (ref >59)
Globulin, Total: 3.2 g/dL (ref 1.5–4.5)
Glucose: 116 mg/dL — ABNORMAL HIGH (ref 65–99)
Potassium: 3.9 mmol/L (ref 3.5–5.2)
Sodium: 144 mmol/L (ref 134–144)
Total Protein: 7.4 g/dL (ref 6.0–8.5)

## 2020-02-03 LAB — T4: T4, Total: 8.5 ug/dL (ref 4.5–12.0)

## 2020-02-03 LAB — TSH: TSH: 1.14 u[IU]/mL (ref 0.450–4.500)

## 2020-02-03 LAB — INSULIN, RANDOM: INSULIN: 12 u[IU]/mL (ref 2.6–24.9)

## 2020-02-03 LAB — T3: T3, Total: 120 ng/dL (ref 71–180)

## 2020-02-03 LAB — VITAMIN D 25 HYDROXY (VIT D DEFICIENCY, FRACTURES): Vit D, 25-Hydroxy: 54.1 ng/mL (ref 30.0–100.0)

## 2020-02-07 NOTE — Progress Notes (Signed)
Dear Flossie Buffy, NP,   Thank you for referring Rose Miller to our clinic. The following note includes my evaluation and treatment recommendations.  Chief Complaint:   OBESITY Rose Miller (MR# [id number]) is a 65 y.o. female who presents for evaluation and treatment of obesity and related comorbidities. Current BMI is Body mass index is 48.21 kg/m. Rose Miller has been struggling with her weight for many years and has been unsuccessful in either losing weight, maintaining weight loss, or reaching her healthy weight goal.  Rose Miller was referred by sister and her primary care physician. Rose Miller noticed an increase in weight with pandemic. She eats mostly chicken and seafood. Breakfast is Cheerios or Honey Bunches of Oats cereal (1.5 cups) with almond milk (feels full). 4 hours later Rose Miller will eat leftovers (feel satisfied). Snack before or after dinner is Bellvita biscuits. For dinner, fish (6-8 oz) and pasta (1 cup) (felt full).  Rose Miller is currently in the action stage of change and ready to dedicate time achieving and maintaining a healthier weight. Rose Miller is interested in becoming our patient and working on intensive lifestyle modifications including (but not limited to) diet and exercise for weight loss.  Rose Miller's habits were reviewed today and are as follows: Her family eats meals together, she thinks her family will eat healthier with her, she has been heavy most of her life, her heaviest weight ever was 356 pounds, she has significant food cravings issues, she snacks frequently in the evenings, she skips meals frequently, she frequently eats larger portions than normal, she has binge eating behaviors and she struggles with emotional eating.  Depression Screen [place]'s Food and Mood (modified PHQ-9) score was 2.  Depression screen PHQ 2/9 02/02/2020  Decreased Interest 1  Down, Depressed, Hopeless 0  PHQ - 2 Score 1  Altered sleeping 0  Tired, decreased energy 1   Change in appetite 0  Feeling bad or failure about yourself  0  Trouble concentrating 0  Moving slowly or fidgety/restless 0  Suicidal thoughts 0  PHQ-9 Score 2  Difficult doing work/chores Not difficult at all   Subjective:   1. Other fatigue  Rose Miller denies daytime somnolence and denies waking up still tired. Patent has a history of symptoms of Epworth sleepiness scale. Rose Miller generally gets 8 hours of sleep per night, and states that she has difficulty falling asleep. Snoring is present. Apneic episodes is not present. Epworth Sleepiness Score is 2.  2. SOB (shortness of breath) on exertion  Tim does feel that she gets out of breath more easily that she used to when she exercises. Talani's shortness of breath appears to be obesity related and exercise induced. She has agreed to work on weight loss and gradually increase exercise to treat her exercise induced shortness of breath. Will continue to monitor closely. Rose Miller notes increasing shortness of breath with exercising and seems to be worsening over time with weight gain. She notes getting out of breath sooner with activity than she used to. This has gotten worse recently. Rose Miller denies shortness of breath at rest or orthopnea.  3. Type 2 diabetes mellitus with hyperglycemia, without long-term current use of insulin (HCC)  Medications reviewed. Diabetic ROS: no polyuria or polydipsia, no chest pain, dyspnea or TIA's, no numbness, tingling or pain in extremities. Nature's last Aic was 6.7. Her last eye exam was September 2020. She is not on medications. Rose Miller would like to defer medication at this time. Rose Miller did not get her flu shot.  Lab  Results  Component Value Date   HGBA1C 6.7 (H) 11/17/2019   HGBA1C 6.2 02/17/2019   HGBA1C 6.7 (H) 10/02/2017   Lab Results  Component Value Date   MICROALBUR 1.1 02/17/2019   LDLCALC 55 11/17/2019   CREATININE 1.10 (H) 02/02/2020   Lab Results  Component Value Date   INSULIN  12.0 02/02/2020   4. Hypertension associated with diabetes ([place])   Review: taking medications as instructed, no medication side effects noted, no chest pain on exertion, no dyspnea on exertion, no swelling of ankles. Rose Miller was diagnosed in her 78's. Rose Miller is on hydralazine, amlodipine, losartan-HCTZ. Rose Miller sees a Cardiologist.  BP Readings from Last 3 Encounters:  02/02/20 134/73  11/17/19 136/80  06/15/19 136/78   5. Stage 3a chronic kidney disease ([place]) Lexys is not seeing Nephrology. She has a history of Hypertension associated with diabetes.  6.  Hyperlipidemia associated with type 2 diabetes mellitus ([place]) Mahogany has hyperlipidemia and has been trying to improve her cholesterol levels with intensive lifestyle modification including a low saturated fat diet, exercise and weight loss. She denies any chest pain, claudication or myalgias. Rose Miller has been on atorvastatin for 5-6 years. No transaminitis. Her last fasting lipid panel was in November 2021.  Lab Results  Component Value Date   ALT 13 02/02/2020   AST 15 02/02/2020   ALKPHOS 68 02/02/2020   BILITOT 0.3 02/02/2020   Lab Results  Component Value Date   CHOL 125 11/17/2019   HDL 51.50 11/17/2019   LDLCALC 55 11/17/2019   TRIG 90.0 11/17/2019   CHOLHDL 2 11/17/2019   7. Vitamin D deficiency  Rose Miller is on OTC Vitamin D. Denies nausea, vomiting, or muscle weakness, but endorses fatigue.  Assessment/Plan:   1. Other fatigue Rose Miller does feel that her weight is causing her energy to be lower than it should be. Fatigue may be related to obesity, depression or many other causes. Labs will be ordered, and in the meanwhile, Rose Miller will focus on self care including making healthy food choices, increasing physical activity and focusing on stress reduction. - EKG 12-Lead - T3 - T4 - TSH  2. SOB (shortness of breath) on exertion Rose Miller does feel that she gets out of breath more easily that she used to when she  exercises. Rose Miller's shortness of breath appears to be obesity related and exercise induced. She has agreed to work on weight loss and gradually increase exercise to treat her exercise induced shortness of breath. Will continue to monitor closely. - CBC with Differential/Platelet  3. Type 2 diabetes mellitus with hyperglycemia, without long-term current use of insulin (HCC) Good blood sugar control is important to decrease the likelihood of diabetic complications such as nephropathy, neuropathy, limb loss, blindness, coronary artery disease, and death. Intensive lifestyle modification including diet, exercise and weight loss are the first line of treatment for diabetes.  - Insulin, random  4. Hypertension associated with diabetes ([place]) Raeleigh is working on healthy weight loss and exercise to improve blood pressure control. We will watch for signs of hypotension as she continues her lifestyle modifications. Ramah will follow up with Cardiology in March 2022.  5. Stage 3a chronic kidney disease ([place]) - Comprehensive metabolic panel  6. Hyperlipidemia associated with type 2 diabetes mellitus ([place]) Cardiovascular risk and specific lipid/LDL goals reviewed.  We discussed several lifestyle modifications today and Sydell will continue to work on diet, exercise and weight loss efforts. Orders and follow up as documented in patient record. Haillie will follow up with  her primary care physician.  Counseling Intensive lifestyle modifications are the first line treatment for this issue. . Dietary changes: Increase soluble fiber. Decrease simple carbohydrates. . Exercise changes: Moderate to vigorous-intensity aerobic activity 150 minutes per week if tolerated. . Lipid-lowering medications: see documented in medical record.  7. Vitamin D deficiency Low Vitamin D level contributes to fatigue and are associated with obesity, breast, and colon cancer. She agrees to continue to take prescription Vitamin D  @50 ,000 IU every week and will follow-up for routine testing of Vitamin D, at least 2-3 times per year to avoid over-replacement.  - VITAMIN D 25 Hydroxy (Vit-D Deficiency, Fractures)  8. Depression screening Behavior modification techniques were discussed today to help Lindalou deal with her emotional/non-hunger eating behaviors.  Orders and follow up as documented in patient record.   9. Class 3 severe obesity with serious comorbidity and body mass index (BMI) of 45.0 to 49.9 in adult, unspecified obesity type (HCC)  Aziah is currently in the action stage of change and her goal is to continue with weight loss efforts. I recommend Leaha begin the structured treatment plan as follows:  She has agreed to the Category 2 Plan.  II/IV systolic murmur heard best at aortic +radiating.  Exercise goals: No exercise has been prescribed at this time.   Behavioral modification strategies: increasing lean protein intake, meal planning and cooking strategies, keeping healthy foods in the home and planning for success.  She was informed of the importance of frequent follow-up visits to maximize her success with intensive lifestyle modifications for her multiple health conditions. She was informed we would discuss her lab results at her next visit unless there is a critical issue that needs to be addressed sooner. Annelie agreed to keep her next visit at the agreed upon time to discuss these results.  Objective:   Blood pressure 134/73, pulse 89, temperature 98.5 F (36.9 C), temperature source Oral, height 5\' 10"  (1.778 m), weight (!) 336 lb (152.4 kg), SpO2 97 %. Body mass index is 48.21 kg/m.  EKG: Normal sinus rhythm, rate 86 bpm.  Indirect Calorimeter completed today shows a VO2 of 211 and a REE of 1471.  Her calculated basal metabolic rate is 1607 thus her basal metabolic rate is worse than expected.  General: Cooperative, alert, well developed, in no acute distress. HEENT: Conjunctivae and  lids unremarkable. Cardiovascular: Regular rhythm.  Lungs: Normal work of breathing. Neurologic: No focal deficits.   Lab Results  Component Value Date   CREATININE 1.10 (H) 02/02/2020   BUN 11 02/02/2020   NA 144 02/02/2020   K 3.9 02/02/2020   CL 103 02/02/2020   CO2 23 02/02/2020   Lab Results  Component Value Date   ALT 13 02/02/2020   AST 15 02/02/2020   ALKPHOS 68 02/02/2020   BILITOT 0.3 02/02/2020   Lab Results  Component Value Date   HGBA1C 6.7 (H) 11/17/2019   HGBA1C 6.2 02/17/2019   HGBA1C 6.7 (H) 10/02/2017   HGBA1C 6.7 (H) 03/31/2017   HGBA1C 6.3 03/29/2016   Lab Results  Component Value Date   INSULIN 12.0 02/02/2020   Lab Results  Component Value Date   TSH 1.140 02/02/2020   Lab Results  Component Value Date   CHOL 125 11/17/2019   HDL 51.50 11/17/2019   LDLCALC 55 11/17/2019   TRIG 90.0 11/17/2019   CHOLHDL 2 11/17/2019   Lab Results  Component Value Date   WBC 3.8 02/02/2020   HGB 11.4 02/02/2020  HCT 35.2 02/02/2020   MCV 85 02/02/2020   PLT 263 02/02/2020   No results found for: IRON, TIBC, FERRITIN Obesity Behavioral Intervention:   Approximately 15 minutes were spent on the discussion below.  ASK: We discussed the diagnosis of obesity with [name] today and Edina agreed to give [place] permission to discuss obesity behavioral modification therapy today.  ASSESS: Aritza has the diagnosis of obesity and her BMI today is 48.3. Zellie is in the action stage of change.   ADVISE: Amayia was educated on the multiple health risks of obesity as well as the benefit of weight loss to improve her health. She was advised of the need for long term treatment and the importance of lifestyle modifications to improve her current health and to decrease her risk of future health problems.  AGREE: Multiple dietary modification options and treatment options were discussed and Gabbriel agreed to follow the recommendations documented in the above  note.  ARRANGE: Jaidee was educated on the importance of frequent visits to treat obesity as outlined per CMS and USPSTF guidelines and agreed to schedule her next follow up appointment today.  Attestation Statements:   Reviewed by clinician on day of visit: allergies, medications, problem list, medical history, surgical history, family history, social history, and previous encounter notes.  This is the patient's first visit at [facility]. The patient's NEW PATIENT PACKET was reviewed at length. Included in the packet: current and past health history, medications, allergies, ROS, gynecologic history (women only), surgical history, family history, social history, weight history, weight loss surgery history (for those that have had weight loss surgery), nutritional evaluation, mood and food questionnaire, PHQ9, Epworth questionnaire, sleep habits questionnaire, patient life and health improvement goals questionnaire. These will all be scanned into the patient's chart under media.   During the visit, I independently reviewed the patient's EKG, bioimpedance scale results, and indirect calorimeter results. I used this information to tailor a meal plan for the patient that will help her to lose weight and will improve her obesity-related conditions going forward. I performed a medically necessary appropriate examination and/or evaluation. I discussed the assessment and treatment plan with the patient. The patient was provided an opportunity to ask questions and all were answered. The patient agreed with the plan and demonstrated an understanding of the instructions. Labs were ordered at this visit and will be reviewed at the next visit unless more critical results need to be addressed immediately. Clinical information was updated and documented in the EMR.   Time spent on visit including pre-visit chart review and post-visit care was 45 minutes.   A separate 15 minutes was spent on risk  counseling (see above).   I, Para March, am acting as transcriptionist for Coralie Common, MD.  I have reviewed the above documentation for accuracy and completeness, and I agree with the above. - Jinny Blossom, MD

## 2020-02-16 ENCOUNTER — Ambulatory Visit (INDEPENDENT_AMBULATORY_CARE_PROVIDER_SITE_OTHER): Payer: Medicare Other | Admitting: Family Medicine

## 2020-02-16 ENCOUNTER — Other Ambulatory Visit: Payer: Self-pay

## 2020-02-16 ENCOUNTER — Encounter (INDEPENDENT_AMBULATORY_CARE_PROVIDER_SITE_OTHER): Payer: Self-pay | Admitting: Family Medicine

## 2020-02-16 VITALS — BP 150/80 | HR 58 | Temp 97.6°F | Ht 70.0 in | Wt 337.0 lb

## 2020-02-16 DIAGNOSIS — E1169 Type 2 diabetes mellitus with other specified complication: Secondary | ICD-10-CM

## 2020-02-16 DIAGNOSIS — Z6841 Body Mass Index (BMI) 40.0 and over, adult: Secondary | ICD-10-CM

## 2020-02-16 DIAGNOSIS — E1165 Type 2 diabetes mellitus with hyperglycemia: Secondary | ICD-10-CM

## 2020-02-16 DIAGNOSIS — E559 Vitamin D deficiency, unspecified: Secondary | ICD-10-CM

## 2020-02-16 DIAGNOSIS — E785 Hyperlipidemia, unspecified: Secondary | ICD-10-CM | POA: Diagnosis not present

## 2020-02-16 MED ORDER — OZEMPIC (0.25 OR 0.5 MG/DOSE) 2 MG/1.5ML ~~LOC~~ SOPN: 0.2500 mg | PEN_INJECTOR | SUBCUTANEOUS | 0 refills | Status: DC

## 2020-02-17 NOTE — Progress Notes (Signed)
Chief Complaint:   OBESITY Rose Miller is here to discuss her progress with her obesity treatment plan along with follow-up of her obesity related diagnoses. Rose Miller is on the Category 2 Plan and states she is following her eating plan approximately 0% of the time. Rose Miller states she is walking 20 minutes 1 times per week.  Today's visit was #: 2 Starting weight: 336 lbs Starting date: 02/02/2020 Today's weight: 337 lbs Today's date: 02/16/2020 Total lbs lost to date: 0 Total lbs lost since last in-office visit: 0  Interim History: Pt's got to the supermarket on 2nd week, but at the end of 2nd week, she did find herself eating off plan more frequently. She felt that sodium level of lean cuisines/microwave meals was too high. Mostly eating cereal for breakfast, lunch was lean cuisine, and dinner was eggplant parm. Pt is also struggling in terms of prep and is considering things like Hello Fresh.  Subjective:   1. Type 2 diabetes mellitus with hyperglycemia, without long-term current use of insulin (HCC) Pt's last A1c was 6.7 and insulin was 12.0. She was diagnosed approximately 2 years ago. She is not on medication.  2. Hyperlipidemia associated with type 2 diabetes mellitus (Rose Miller) Pt is on Lipitor 10 mg daily. She denies myalgias or transaminitis. Her last LDL was 55.   3. Vitamin D deficiency Pt's last Vit D level was 54.1. She is on OTC Vit D 1,000 units daily.  Assessment/Plan:   1. Type 2 diabetes mellitus with hyperglycemia, without long-term current use of insulin (HCC) Good blood sugar control is important to decrease the likelihood of diabetic complications such as nephropathy, neuropathy, limb loss, blindness, coronary artery disease, and death. Intensive lifestyle modification including diet, exercise and weight loss are the first line of treatment for diabetes. Start Ozempic 0.25 mg SubQ weekly.  - Semaglutide,0.25 or 0.5MG /DOS, (OZEMPIC, 0.25 OR 0.5 MG/DOSE,) 2 MG/1.5ML  SOPN; Inject 0.25 mg into the skin once a week.  Dispense: 1.5 mL; Refill: 0  2. Hyperlipidemia associated with type 2 diabetes mellitus ([place]) Cardiovascular risk and specific lipid/LDL goals reviewed.  We discussed several lifestyle modifications today and Rose Miller will continue to work on diet, exercise and weight loss efforts. Orders and follow up as documented in patient record. Continue Lipitor. Currently at goal.  Counseling Intensive lifestyle modifications are the first line treatment for this issue. . Dietary changes: Increase soluble fiber. Decrease simple carbohydrates. . Exercise changes: Moderate to vigorous-intensity aerobic activity 150 minutes per week if tolerated. . Lipid-lowering medications: see documented in medical record.   3. Vitamin D deficiency Low Vitamin D level contributes to fatigue and are associated with obesity, breast, and colon cancer. She agrees to continue to take OTC Vit D 1,000 units and will follow-up for routine testing of Vitamin D, at least 2-3 times per year to avoid over-replacement.  4. Class 3 severe obesity with serious comorbidity and body mass index (BMI) of 45.0 to 49.9 in adult, unspecified obesity type (HCC) Rose Miller is currently in the action stage of change. As such, her goal is to continue with weight loss efforts. She has agreed to keeping a food journal and adhering to recommended goals of 1150-1250 calories and 80+ g protein and the Rose Miller.   Exercise goals: No exercise has been prescribed at this time.  Behavioral modification strategies: increasing lean protein intake, meal planning and cooking strategies, keeping healthy foods in the home and planning for success.  Rose Miller has agreed to follow-up with  our clinic in 2 weeks. She was informed of the importance of frequent follow-up visits to maximize her success with intensive lifestyle modifications for her multiple health conditions.   Objective:   Blood pressure (!)  150/80, pulse (!) 58, temperature 97.6 F (36.4 C), temperature source Oral, height 5\' 10"  (1.778 m), weight (!) 337 lb (152.9 kg). Body mass index is 48.35 kg/m.  General: Cooperative, alert, well developed, in no acute distress. HEENT: Conjunctivae and lids unremarkable. Cardiovascular: Regular rhythm.  Lungs: Normal work of breathing. Neurologic: No focal deficits.   Lab Results  Component Value Date   CREATININE 1.10 (H) 02/02/2020   BUN 11 02/02/2020   NA 144 02/02/2020   K 3.9 02/02/2020   CL 103 02/02/2020   CO2 23 02/02/2020   Lab Results  Component Value Date   ALT 13 02/02/2020   AST 15 02/02/2020   ALKPHOS 68 02/02/2020   BILITOT 0.3 02/02/2020   Lab Results  Component Value Date   HGBA1C 6.7 (H) 11/17/2019   HGBA1C 6.2 02/17/2019   HGBA1C 6.7 (H) 10/02/2017   HGBA1C 6.7 (H) 03/31/2017   HGBA1C 6.3 03/29/2016   Lab Results  Component Value Date   INSULIN 12.0 02/02/2020   Lab Results  Component Value Date   TSH 1.140 02/02/2020   Lab Results  Component Value Date   CHOL 125 11/17/2019   HDL 51.50 11/17/2019   LDLCALC 55 11/17/2019   TRIG 90.0 11/17/2019   CHOLHDL 2 11/17/2019   Lab Results  Component Value Date   WBC 3.8 02/02/2020   HGB 11.4 02/02/2020   HCT 35.2 02/02/2020   MCV 85 02/02/2020   PLT 263 02/02/2020   No results found for: IRON, TIBC, FERRITIN  Obesity Behavioral Intervention:   Approximately 15 minutes were spent on the discussion below.  ASK: We discussed the diagnosis of obesity with Rose Miller today and Rose Miller agreed to give [place] permission to discuss obesity behavioral modification therapy today.  ASSESS: Rose Miller has the diagnosis of obesity and her BMI today is 48.4. Rose Miller is in the action stage of change.   ADVISE: Rose Miller was educated on the multiple health risks of obesity as well as the benefit of weight loss to improve her health. She was advised of the need for long term treatment and the importance of  lifestyle modifications to improve her current health and to decrease her risk of future health problems.  AGREE: Multiple dietary modification options and treatment options were discussed and Rose Miller agreed to follow the recommendations documented in the above note.  ARRANGE: Rose Miller was educated on the importance of frequent visits to treat obesity as outlined per CMS and USPSTF guidelines and agreed to schedule her next follow up appointment today.  Attestation Statements:   Reviewed by clinician on day of visit: allergies, medications, problem list, medical history, surgical history, family history, social history, and previous encounter notes.  Coral Ceo, am acting as transcriptionist for Coralie Common, MD.   I have reviewed the above documentation for accuracy and completeness, and I agree with the above. - Jinny Blossom, MD

## 2020-02-18 ENCOUNTER — Telehealth: Payer: Self-pay

## 2020-02-18 NOTE — Telephone Encounter (Signed)
This medication or product is on your plan's list of covered drugs. Prior authorization is not required at this time. If your pharmacy has questions regarding the processing of your prescription, please have them call the [facility] pharmacy help desk at [phone]. **Please note: Formulary lowering, tiering exception, cost reduction and/or pre-benefit determination review (including prospective Medicare hospice reviews) requests cannot be requested using this method of submission. Please contact us at [phone] instead.

## 2020-02-21 NOTE — Progress Notes (Signed)
Subjective:   Rose Miller is a 65 y.o. female who presents for an Initial Medicare Annual Wellness Visit.  Review of Systems     Cardiac Risk Factors include: none;advanced age (>18men, >54 women);diabetes mellitus;dyslipidemia;hypertension;sedentary lifestyle;obesity (BMI >30kg/m2)     Objective:    Today's Vitals   02/22/20 0903  BP: 110/70  Pulse: 77  Resp: 16  Temp: (!) 96.6 F (35.9 C)  TempSrc: Temporal  SpO2: 96%  Weight: (!) 339 lb (153.8 kg)  Height: 5\' 10"  (1.778 m)   Body mass index is 48.64 kg/m.  Advanced Directives 02/22/2020 05/01/2017 06/07/2014 05/24/2014  Does Patient Have a Medical Advance Directive? No No No No  Would patient like information on creating a medical advance directive? No - Patient declined No - Patient declined - No - patient declined information    Current Medications (verified) Outpatient Encounter Medications as of 02/22/2020  Medication Sig   amLODipine (NORVASC) 10 MG tablet TAKE 1 TABLET BY MOUTH  DAILY   atorvastatin (LIPITOR) 10 MG tablet Take 1 tablet (10 mg total) by mouth daily.   Bayer Microlet Lancets lancets Use when testing glucose as recommended.   Blood Pressure Monitoring (SPHYGMOMANOMETER) MISC 1 Units by Does not apply route daily.   Cholecalciferol 25 MCG (1000 UT) tablet Take 1,000 Units by mouth daily.   Coenzyme Q10 (CO Q-10) 100 MG CAPS Take 100 mg by mouth daily.    Cyanocobalamin (B-12) 2500 MCG TABS Take 1 tablet by mouth daily.    glucose blood (CONTOUR NEXT TEST) test strip Test up to 4 times per day as recommended   hydrALAZINE (APRESOLINE) 25 MG tablet TAKE 1 TABLET BY MOUTH  TWICE DAILY   losartan-hydrochlorothiazide (HYZAAR) 100-12.5 MG tablet Take 1 tablet by mouth daily.   Semaglutide,0.25 or 0.5MG /DOS, (OZEMPIC, 0.25 OR 0.5 MG/DOSE,) 2 MG/1.5ML SOPN Inject 0.25 mg into the skin once a week.   vitamin C (ASCORBIC ACID) 500 MG tablet Take 500 mg by mouth daily.    Zinc 50 MG CAPS Take by  mouth.   No facility-administered encounter medications on file as of 02/22/2020.    Allergies (verified) Patient has no known allergies.   History: Past Medical History:  Diagnosis Date   Adhesive capsulitis    Allergy    Aortic valve stenosis    Arthritis    Asthma    Back pain    Bilateral swelling of feet    CKD (chronic kidney disease) stage 3, GFR 30-59 ml/min (HCC)    Constipation    Diabetes mellitus without complication (HCC)    Diuretic-induced hypokalemia    Heart murmur    Heartburn    Hypercholesterolemia    Hypertension    IT band syndrome    Joint pain    Obesity    Palpitations    Periumbilical pain    Postmenopausal estrogen deficiency    Prediabetes    Shoulder pain, right    Swallowing difficulty    Ventral hernia    Past Surgical History:  Procedure Laterality Date   CESAREAN SECTION  1989   TONSILLECTOMY AND ADENOIDECTOMY  1960   Family History  Problem Relation Age of Onset   Arthritis Mother    Cancer Mother        pancreatic   Hypertension Mother    Obesity Mother    Cancer Father        prostate and colon   Colon cancer Father    Heart disease Father  Thyroid disease Father    Social History   Socioeconomic History   Marital status: Single    Spouse name: Not on file   Number of children: Not on file   Years of education: Not on file   Highest education level: Not on file  Occupational History   Occupation: retired  Tobacco Use   Smoking status: Former Smoker   Smokeless tobacco: Never Used  Scientific laboratory technician Use: Never used  Substance and Sexual Activity   Alcohol use: No    Alcohol/week: 0.0 standard drinks   Drug use: No   Sexual activity: Not on file  Other Topics Concern   Not on file  Social History Narrative   Not on file   Social Determinants of Health   Financial Resource Strain: Medium Risk   Difficulty of Paying Living Expenses: Somewhat hard  Food  Insecurity: No Food Insecurity   Worried About Charity fundraiser in the Last Year: Never true   [place] in the Last Year: Never true  Transportation Needs: No Transportation Needs   Lack of Transportation (Medical): No   Lack of Transportation (Non-Medical): No  Physical Activity: Inactive   Days of Exercise per Week: 0 days   Minutes of Exercise per Session: 0 min  Stress: No Stress Concern Present   Feeling of Stress : Not at all  Social Connections: Socially Isolated   Frequency of Communication with Friends and Family: More than three times a week   Frequency of Social Gatherings with Friends and Family: Once a week   Attends Religious Services: Never   Marine scientist or Organizations: No   Attends Music therapist: Never   Marital Status: Never married    Tobacco Counseling Counseling given: Not Answered   Clinical Intake:  Pre-visit preparation completed: Yes  Pain : No/denies pain     Nutritional Status: BMI > 30  Obese Nutritional Risks: None Diabetes: Yes CBG done?: No Did pt. bring in CBG monitor from home?: No  How often do you need to have someone help you when you read instructions, pamphlets, or other written materials from your doctor or pharmacy?: 1 - Never  Diabetes:  Is the patient diabetic?  Yes  If diabetic, was a CBG obtained today?  No  Did the patient bring in their glucometer from home?  No  How often do you monitor your CBG's? occasionally.   Financial Strains and Diabetes Management:  Are you having any financial strains with the device, your supplies or your medication? Yes . Ozempic Does the patient want to be seen by Chronic Care Management for management of their diabetes?  No  Would the patient like to be referred to a Nutritionist or for Diabetic Management?  No   Diabetic Exams:  Diabetic Eye Exam:. Overdue for diabetic eye exam. Pt has been advised about the importance in completing  this exam.Patient plans to call & make an appt  Diabetic Foot Exam: Pt has been advised about the importance in completing this exam. To be completed by PCP    Interpreter Needed?: No  Information entered by :: Caroleen Hamman LPN   Activities of Daily Living In your present state of health, do you have any difficulty performing the following activities: 02/22/2020  Hearing? N  Vision? N  Difficulty concentrating or making decisions? N  Walking or climbing stairs? N  Dressing or bathing? N  Doing errands, shopping? N  Preparing Food  and eating ? N  Using the Toilet? N  In the past six months, have you accidently leaked urine? N  Do you have problems with loss of bowel control? N  Managing your Medications? N  Managing your Finances? N  Housekeeping or managing your Housekeeping? N  Some recent data might be hidden    Patient Care Team: [name], [name], NP as PCP - General (Internal Medicine)  Indicate any recent Medical Services you may have received from other than [facility] providers in the past year (date may be approximate).     Assessment:   This is a routine wellness examination for Joiner.  Hearing/Vision screen  Hearing Screening   125Hz  250Hz  500Hz  1000Hz  2000Hz  3000Hz  4000Hz  6000Hz  8000Hz   Right ear:           Left ear:           Comments: No issues  Vision Screening Comments: wears glassses Last eye exam-2020-  Dietary issues and exercise activities discussed: Current Exercise Habits: The patient does not participate in regular exercise at present, Exercise limited by: None identified  Goals     Patient Stated     Would like to lose weight & improve health      Depression Screen PHQ 2/9 Scores 02/22/2020 02/02/2020 05/17/2019 02/17/2019 05/01/2017 03/31/2017 03/29/2016  PHQ - 2 Score 0 1 0 0 0 0 0  PHQ- 9 Score - 2 - - - - -    Fall Risk Fall Risk  02/22/2020 11/17/2019 05/17/2019 02/17/2019 05/01/2017  Falls in the past year? 0 0 0 0 No  Number falls in past  yr: 0 - 0 - -  Injury with Fall? 0 - 0 - -  Follow up Falls prevention discussed - - - -    FALL RISK PREVENTION PERTAINING TO THE HOME:  Any stairs in or around the home? No  Home free of loose throw rugs in walkways, pet beds, electrical cords, etc? Yes  Adequate lighting in your home to reduce risk of falls? Yes   ASSISTIVE DEVICES UTILIZED TO PREVENT FALLS:  Life alert? No  Use of a cane, walker or w/c? No  Grab bars in the bathroom? No  Shower chair or bench in shower? No  Elevated toilet seat or a handicapped toilet? No   TIMED UP AND GO:  Was the test performed? Yes .  Length of time to ambulate 10 feet: 10 sec.   Gait steady and fast without use of assistive device  Cognitive Function:Normal cognitive status assessed by direct observation by this Nurse Health Advisor. No abnormalities found.          Immunizations Immunization History  Administered Date(s) Administered   Influenza,inj,Quad PF,6+ Mos 12/19/2014   Tdap 12/19/2014, 12/19/2014    TDAP status: Up to date  Flu Vaccine status: Declined, Education has been provided regarding the importance of this vaccine but patient still declined. Advised may receive this vaccine at local pharmacy or Health Dept. Aware to provide a copy of the vaccination record if obtained from local pharmacy or Health Dept. Verbalized acceptance and understanding.  Pneumococcal vaccine status: Declined,  Education has been provided regarding the importance of this vaccine but patient still declined. Advised may receive this vaccine at local pharmacy or Health Dept. Aware to provide a copy of the vaccination record if obtained from local pharmacy or Health Dept. Verbalized acceptance and understanding.   Covid-19 vaccine status: Declined, Education has been provided regarding the importance of this vaccine  but patient still declined. Advised may receive this vaccine at local pharmacy or Health Dept.or vaccine clinic. Aware to provide  a copy of the vaccination record if obtained from local pharmacy or Health Dept. Verbalized acceptance and understanding.  Qualifies for Shingles Vaccine? Yes   Zostavax completed No   Shingrix Completed?: No.    Education has been provided regarding the importance of this vaccine. Patient has been advised to call insurance company to determine out of pocket expense if they have not yet received this vaccine. Advised may also receive vaccine at local pharmacy or Health Dept. Verbalized acceptance and understanding.  Screening Tests Health Maintenance  Topic Date Due   COVID-19 Vaccine (1) Never done   OPHTHALMOLOGY EXAM  Never done   PNA vac Low Risk Adult (1 of 2 - PCV13) Never done   COLONOSCOPY (Pts 45-37yrs Insurance coverage will need to be confirmed)  06/07/2019   FOOT EXAM  02/17/2020   INFLUENZA VACCINE  04/13/2020 (Originally 08/15/2019)   HEMOGLOBIN A1C  05/16/2020   MAMMOGRAM  12/01/2021   TETANUS/TDAP  12/18/2024   DEXA SCAN  Completed   Hepatitis C Screening  Completed    Health Maintenance  Health Maintenance Due  Topic Date Due   COVID-19 Vaccine (1) Never done   OPHTHALMOLOGY EXAM  Never done   PNA vac Low Risk Adult (1 of 2 - PCV13) Never done   COLONOSCOPY (Pts 45-69yrs Insurance coverage will need to be confirmed)  06/07/2019   FOOT EXAM  02/17/2020    Colorectal cancer screening: Colonoscopy scheduled for 03/2020  Mammogram status: Completed Bilateral-12/02/2019. Repeat every year  Bone Density status: Completed 05/04/2019. Results reflect: Bone density results: NORMAL. Repeat every 2 years.  Lung Cancer Screening: (Low Dose CT Chest recommended if Age 73-80 years, 30 pack-year currently smoking OR have quit w/in 15years.) does not qualify.    Additional Screening:  Hepatitis C Screening:Completed 04/07/2017  Vision Screening: Recommended annual ophthalmology exams for early detection of glaucoma and other disorders of the eye. Is the  patient up to date with their annual eye exam?  No  Who is the provider or what is the name of the office in which the patient attends annual eye exams? [facility]   Dental Screening: Recommended annual dental exams for proper oral hygiene  Community Resource Referral / Chronic Care Management: CRR required this visit?  No   CCM required this visit?  Yes For assistance with cost of Ozempic     Plan:     I have personally reviewed and noted the following in the patients chart:    Medical and social history  Use of alcohol, tobacco or illicit drugs   Current medications and supplements  Functional ability and status  Nutritional status  Physical activity  Advanced directives  List of other physicians  Hospitalizations, surgeries, and ER visits in previous 12 months  Vitals  Screenings to include cognitive, depression, and falls  Referrals and appointments  In addition, I have reviewed and discussed with patient certain preventive protocols, quality metrics, and best practice recommendations. A written personalized care plan for preventive services as well as general preventive health recommendations were provided to patient.   Patient to access avs on mychart.   Marta Antu, LPN   02/18/2351  Nurse Health Advisor  Nurse Notes: None

## 2020-02-22 ENCOUNTER — Ambulatory Visit (INDEPENDENT_AMBULATORY_CARE_PROVIDER_SITE_OTHER): Payer: Medicare Other

## 2020-02-22 ENCOUNTER — Other Ambulatory Visit: Payer: Self-pay

## 2020-02-22 VITALS — BP 110/70 | HR 77 | Temp 96.6°F | Resp 16 | Ht 70.0 in | Wt 339.0 lb

## 2020-02-22 DIAGNOSIS — Z0000 Encounter for general adult medical examination without abnormal findings: Secondary | ICD-10-CM

## 2020-02-22 NOTE — Patient Instructions (Signed)
Rose Miller , Thank you for taking time to come for your Medicare Wellness Visit. I appreciate your ongoing commitment to your health goals. Please review the following plan we discussed and let me know if I can assist you in the future.   Screening recommendations/referrals: Colonoscopy: Scheduled for 03/2020 Mammogram: Completed 12/02/2019-Due 12/01/2020 Bone Density: Completed 05/04/2019-Due 05/03/2021 Recommended yearly ophthalmology/optometry visit for glaucoma screening and checkup Recommended yearly dental visit for hygiene and checkup  Vaccinations: Influenza vaccine: Declined Pneumococcal vaccine: Declined Tdap vaccine: Up to date-Due 12/18/2024 Shingles vaccine: Discuss with pharmavy   Covid-19:Declined  Advanced directives: Declined information today  Conditions/risks identified: See problem list  Next appointment: Follow up in one year for your annual wellness visit 02/27/2021 @ 9:45   Preventive Care 1 Years and Older, Female Preventive care refers to lifestyle choices and visits with your health care provider that can promote health and wellness. What does preventive care include?  A yearly physical exam. This is also called an annual well check.  Dental exams once or twice a year.  Routine eye exams. Ask your health care provider how often you should have your eyes checked.  Personal lifestyle choices, including:  Daily care of your teeth and gums.  Regular physical activity.  Eating a healthy diet.  Avoiding tobacco and drug use.  Limiting alcohol use.  Practicing safe sex.  Taking low-dose aspirin every day.  Taking vitamin and mineral supplements as recommended by your health care provider. What happens during an annual well check? The services and screenings done by your health care provider during your annual well check will depend on your age, overall health, lifestyle risk factors, and family history of disease. Counseling  Your health care  provider may ask you questions about your:  Alcohol use.  Tobacco use.  Drug use.  Emotional well-being.  Home and relationship well-being.  Sexual activity.  Eating habits.  History of falls.  Memory and ability to understand (cognition).  Work and work Statistician.  Reproductive health. Screening  You may have the following tests or measurements:  Height, weight, and BMI.  Blood pressure.  Lipid and cholesterol levels. These may be checked every 5 years, or more frequently if you are over 92 years old.  Skin check.  Lung cancer screening. You may have this screening every year starting at age 63 if you have a 30-pack-year history of smoking and currently smoke or have quit within the past 15 years.  Fecal occult blood test (FOBT) of the stool. You may have this test every year starting at age 59.  Flexible sigmoidoscopy or colonoscopy. You may have a sigmoidoscopy every 5 years or a colonoscopy every 10 years starting at age 19.  Hepatitis C blood test.  Hepatitis B blood test.  Sexually transmitted disease (STD) testing.  Diabetes screening. This is done by checking your blood sugar (glucose) after you have not eaten for a while (fasting). You may have this done every 1-3 years.  Bone density scan. This is done to screen for osteoporosis. You may have this done starting at age 73.  Mammogram. This may be done every 1-2 years. Talk to your health care provider about how often you should have regular mammograms. Talk with your health care provider about your test results, treatment options, and if necessary, the need for more tests. Vaccines  Your health care provider may recommend certain vaccines, such as:  Influenza vaccine. This is recommended every year.  Tetanus, diphtheria, and acellular pertussis (Tdap,  Td) vaccine. You may need a Td booster every 10 years.  Zoster vaccine. You may need this after age 53.  Pneumococcal 13-valent conjugate (PCV13)  vaccine. One dose is recommended after age 38.  Pneumococcal polysaccharide (PPSV23) vaccine. One dose is recommended after age 50. Talk to your health care provider about which screenings and vaccines you need and how often you need them. This information is not intended to replace advice given to you by your health care provider. Make sure you discuss any questions you have with your health care provider. Document Released: 01/27/2015 Document Revised: 09/20/2015 Document Reviewed: 11/01/2014 Elsevier Interactive Patient Education  2017 [place] Prevention in the Home Falls can cause injuries. They can happen to people of all ages. There are many things you can do to make your home safe and to help prevent falls. What can I do on the outside of my home?  Regularly fix the edges of walkways and driveways and fix any cracks.  Remove anything that might make you trip as you walk through a door, such as a raised step or threshold.  Trim any bushes or trees on the path to your home.  Use bright outdoor lighting.  Clear any walking paths of anything that might make someone trip, such as rocks or tools.  Regularly check to see if handrails are loose or broken. Make sure that both sides of any steps have handrails.  Any raised decks and porches should have guardrails on the edges.  Have any leaves, snow, or ice cleared regularly.  Use sand or salt on walking paths during winter.  Clean up any spills in your garage right away. This includes oil or grease spills. What can I do in the bathroom?  Use night lights.  Install grab bars by the toilet and in the tub and shower. Do not use towel bars as grab bars.  Use non-skid mats or decals in the tub or shower.  If you need to sit down in the shower, use a plastic, non-slip stool.  Keep the floor dry. Clean up any water that spills on the floor as soon as it happens.  Remove soap buildup in the tub or shower  regularly.  Attach bath mats securely with double-sided non-slip rug tape.  Do not have throw rugs and other things on the floor that can make you trip. What can I do in the bedroom?  Use night lights.  Make sure that you have a light by your bed that is easy to reach.  Do not use any sheets or blankets that are too big for your bed. They should not hang down onto the floor.  Have a firm chair that has side arms. You can use this for support while you get dressed.  Do not have throw rugs and other things on the floor that can make you trip. What can I do in the kitchen?  Clean up any spills right away.  Avoid walking on wet floors.  Keep items that you use a lot in easy-to-reach places.  If you need to reach something above you, use a strong step stool that has a grab bar.  Keep electrical cords out of the way.  Do not use floor polish or wax that makes floors slippery. If you must use wax, use non-skid floor wax.  Do not have throw rugs and other things on the floor that can make you trip. What can I do with my stairs?  Do not  leave any items on the stairs.  Make sure that there are handrails on both sides of the stairs and use them. Fix handrails that are broken or loose. Make sure that handrails are as long as the stairways.  Check any carpeting to make sure that it is firmly attached to the stairs. Fix any carpet that is loose or worn.  Avoid having throw rugs at the top or bottom of the stairs. If you do have throw rugs, attach them to the floor with carpet tape.  Make sure that you have a light switch at the top of the stairs and the bottom of the stairs. If you do not have them, ask someone to add them for you. What else can I do to help prevent falls?  Wear shoes that:  Do not have high heels.  Have rubber bottoms.  Are comfortable and fit you well.  Are closed at the toe. Do not wear sandals.  If you use a stepladder:  Make sure that it is fully  opened. Do not climb a closed stepladder.  Make sure that both sides of the stepladder are locked into place.  Ask someone to hold it for you, if possible.  Clearly mark and make sure that you can see:  Any grab bars or handrails.  First and last steps.  Where the edge of each step is.  Use tools that help you move around (mobility aids) if they are needed. These include:  Canes.  Walkers.  Scooters.  Crutches.  Turn on the lights when you go into a dark area. Replace any light bulbs as soon as they burn out.  Set up your furniture so you have a clear path. Avoid moving your furniture around.  If any of your floors are uneven, fix them.  If there are any pets around you, be aware of where they are.  Review your medicines with your doctor. Some medicines can make you feel dizzy. This can increase your chance of falling. Ask your doctor what other things that you can do to help prevent falls. This information is not intended to replace advice given to you by your health care provider. Make sure you discuss any questions you have with your health care provider. Document Released: 10/27/2008 Document Revised: 06/08/2015 Document Reviewed: 02/04/2014 Elsevier Interactive Patient Education  2017 [employer].

## 2020-02-28 ENCOUNTER — Telehealth: Payer: Self-pay

## 2020-02-28 NOTE — Telephone Encounter (Signed)
   Telephone encounter was:  Successful.  02/28/2020 Name: Rose Miller MRN: [id number] DOB: Oct 04, 1955  Rose Miller is a 65 y.o. year old female who is a primary care patient of [name], [name], NP . The community resource team was consulted for assistance with Medication assistance with [place] guide performed the following interventions: Patient provided with information about care guide support team and interviewed to confirm resource needs Investigation of community resources performed completed patient portion of application over the phone and emailed to [employer]. Physician will need to complete prescriber portion of application..  Follow Up Plan:  Care guide will follow up with patient by phone over the next 7 days and once physician has completed prescriber portion of application she would like it mailed to her. She is aware that she will need to mail to [employer] with proof of income.  Rose Miller, AAS Paralegal, [place] . Embedded Care Coordination [facility]  Care Management  [address], [place] [address] [name]  ?? [phone]   [email or web address]

## 2020-03-02 ENCOUNTER — Other Ambulatory Visit: Payer: Self-pay

## 2020-03-02 ENCOUNTER — Encounter (INDEPENDENT_AMBULATORY_CARE_PROVIDER_SITE_OTHER): Payer: Self-pay | Admitting: Family Medicine

## 2020-03-02 ENCOUNTER — Ambulatory Visit (INDEPENDENT_AMBULATORY_CARE_PROVIDER_SITE_OTHER): Payer: Medicare Other | Admitting: Family Medicine

## 2020-03-02 ENCOUNTER — Telehealth: Payer: Self-pay

## 2020-03-02 VITALS — BP 115/71 | HR 81 | Temp 98.5°F | Ht 70.0 in | Wt 337.0 lb

## 2020-03-02 DIAGNOSIS — Z6841 Body Mass Index (BMI) 40.0 and over, adult: Secondary | ICD-10-CM | POA: Diagnosis not present

## 2020-03-02 DIAGNOSIS — F3289 Other specified depressive episodes: Secondary | ICD-10-CM

## 2020-03-02 DIAGNOSIS — E1159 Type 2 diabetes mellitus with other circulatory complications: Secondary | ICD-10-CM | POA: Diagnosis not present

## 2020-03-02 DIAGNOSIS — I152 Hypertension secondary to endocrine disorders: Secondary | ICD-10-CM | POA: Diagnosis not present

## 2020-03-02 DIAGNOSIS — Z9189 Other specified personal risk factors, not elsewhere classified: Secondary | ICD-10-CM

## 2020-03-02 NOTE — Telephone Encounter (Signed)
Rose Miller did not prescribe the Ozempic. I spoke with the patient & let her know that the form will be mailed to her & she will take it to her weight loss provider who prescribed the Ozempic, for them to fill out.

## 2020-03-06 ENCOUNTER — Telehealth: Payer: Self-pay

## 2020-03-06 NOTE — Progress Notes (Unsigned)
Chief Complaint:   OBESITY Rose Miller is here to discuss her progress with her obesity treatment plan along with follow-up of her obesity related diagnoses. Rose Miller is on keeping a food journal and adhering to recommended goals of 1150-1250 calories and 80 g protein and states she is following her eating plan approximately 25% of the time. Rose Miller states she is doing 0 minutes 0 times per week.  Today's visit was #: 3 Starting weight: 336 lbs Starting date: 02/02/2020 Today's weight: 337 lbs Today's date: 03/02/2020 Total lbs lost to date: 0 Total lbs lost since last in-office visit: 0  Interim History: Rose Miller struggled with logging food, as she didn't understand how to access the free version of [employer]itness[employer]. For a few days pt was limiting herself to protein and vegetables but felt with decreased accountability of not using My[employer]itness[employer], she slowly got off track.  Subjective:   1. Hypertension associated with diabetes ([place]) Pt is on amlodipine, Hyzaar, and Apresoline.  Pt denies chest pain, chest pressure and headache.  2. Other depression, with emotional eating Pt denies suicidal or homicidal ideations. She is not on medication. Pt can acknowledge that she is standing in her own way in terms of compliance to meal plan. She wants to change her mindset but doesn't know how to start.  3. At risk for side effect of medication Rose Miller is at risk for medication side effects due to BP being controlled.  Assessment/Plan:   1. Hypertension associated with diabetes (Rose Miller) Rose Miller is working on healthy weight loss and exercise to improve blood pressure control. We will watch for signs of hypotension as she continues her lifestyle modifications. Continue current meds with no change in dose. If BP is controlled again at next appointment, decrease dose of 1 med.  2. Other depression, with emotional eating Behavior modification techniques were discussed today to help Rose Miller deal with her  emotional/non-hunger eating behaviors.  Orders and follow up as documented in patient record. Refer to Dr. Mallie Mussel.  3. At risk for side effect of medication Rose Miller was given approximately 15 minutes of drug side effect counseling today.  We discussed side effect possibility and risk versus benefits. Rose Miller agreed to the medication and will contact this office if these side effects are intolerable.  Repetitive spaced learning was employed today to elicit superior memory formation and behavioral change.  4. Class 3 severe obesity with serious comorbidity and body mass index (BMI) of 45.0 to 49.9 in adult, unspecified obesity type (Rose Miller) Rose Miller is currently in the action stage of change. As such, her goal is to continue with weight loss efforts. She has agreed to keeping a food journal and adhering to recommended goals of 1150-1250 calories and 80+ g protein.   Exercise goals: No exercise has been prescribed at this time.  Behavioral modification strategies: increasing lean protein intake, planning for success and keeping a strict food journal.  Rose Miller has agreed to follow-up with our clinic in 2-3 weeks. She was informed of the importance of frequent follow-up visits to maximize her success with intensive lifestyle modifications for her multiple health conditions.   Objective:   Blood pressure 115/71, pulse 81, temperature 98.5 F (36.9 C), temperature source Oral, height 5\' 10"  (1.778 m), weight (!) 337 lb (152.9 kg), SpO2 97 %. Body mass index is 48.35 kg/m.  General: Cooperative, alert, well developed, in no acute distress. HEENT: Conjunctivae and lids unremarkable. Cardiovascular: Regular rhythm.  Lungs: Normal work of breathing. Neurologic: No focal deficits.  Lab Results  Component Value Date   CREATININE 1.10 (H) 02/02/2020   BUN 11 02/02/2020   NA 144 02/02/2020   K 3.9 02/02/2020   CL 103 02/02/2020   CO2 23 02/02/2020   Lab Results  Component Value Date   ALT 13  02/02/2020   AST 15 02/02/2020   ALKPHOS 68 02/02/2020   BILITOT 0.3 02/02/2020   Lab Results  Component Value Date   HGBA1C 6.7 (H) 11/17/2019   HGBA1C 6.2 02/17/2019   HGBA1C 6.7 (H) 10/02/2017   HGBA1C 6.7 (H) 03/31/2017   HGBA1C 6.3 03/29/2016   Lab Results  Component Value Date   INSULIN 12.0 02/02/2020   Lab Results  Component Value Date   TSH 1.140 02/02/2020   Lab Results  Component Value Date   CHOL 125 11/17/2019   HDL 51.50 11/17/2019   LDLCALC 55 11/17/2019   TRIG 90.0 11/17/2019   CHOLHDL 2 11/17/2019   Lab Results  Component Value Date   WBC 3.8 02/02/2020   HGB 11.4 02/02/2020   HCT 35.2 02/02/2020   MCV 85 02/02/2020   PLT 263 02/02/2020   No results found for: IRON, TIBC, FERRITIN  Obesity Behavioral Intervention:   Approximately 15 minutes were spent on the discussion below.  ASK: We discussed the diagnosis of obesity with Rose Miller today and Rose Miller agreed to give [place] permission to discuss obesity behavioral modification therapy today.  ASSESS: Rose Miller has the diagnosis of obesity and her BMI today is 48.4. Rose Miller is in the action stage of change.   ADVISE: Kemari was educated on the multiple health risks of obesity as well as the benefit of weight loss to improve her health. She was advised of the need for long term treatment and the importance of lifestyle modifications to improve her current health and to decrease her risk of future health problems.  AGREE: Multiple dietary modification options and treatment options were discussed and Rose Miller agreed to follow the recommendations documented in the above note.  ARRANGE: Rose Miller was educated on the importance of frequent visits to treat obesity as outlined per CMS and USPSTF guidelines and agreed to schedule her next follow up appointment today.  Attestation Statements:   Reviewed by clinician on day of visit: allergies, medications, problem list, medical history, surgical history, family  history, social history, and previous encounter notes.  Coral Ceo, am acting as transcriptionist for Coralie Common, MD.   I have reviewed the above documentation for accuracy and completeness, and I agree with the above. - Jinny Blossom, MD

## 2020-03-06 NOTE — Telephone Encounter (Signed)
   Telephone encounter was:  Unsuccessful.  03/06/2020 Name: Rose Miller MRN: [id number] DOB: 07-18-1955  Unsuccessful outbound call made today to assist with:  medication assistance  Outreach Attempt:  2nd Attempt  A HIPAA compliant voice message was left requesting a return call.  Instructed patient to call back at [phone].  Garland Hincapie, AAS Paralegal, [place] . Embedded Care Coordination [facility]  Care Management  [address], [place] [address] ??[email or web address]  ?? [phone]   [email or web address]

## 2020-03-07 NOTE — Telephone Encounter (Signed)
John:  In doing PV, noted that she has severe Aortic Stenosis.  Last Echo 11/02/19, with TE from her cardiologist on 11/04/2019 to come back in 6 months and to let office know if she becomes symptomatic.  She has DM and a BMI of 48.35  Please advise

## 2020-03-07 NOTE — Telephone Encounter (Signed)
Rose Miller,  Due to her severe AS, as noted by her cardiologist on 11/04/19, she will need to have her procedure performed at the hospital.  Thanks,  Osvaldo Angst

## 2020-03-09 ENCOUNTER — Encounter: Payer: Self-pay | Admitting: Internal Medicine

## 2020-03-09 NOTE — Telephone Encounter (Signed)
Dr. Carlean Purl:  Pt called back and stated she believed her father was in his 56's when diagnosed with colon cancer.  She was informed that her procedure and PV was cancelled.  She also was told that [place] would get back with her to let her know if she needs a direct to [facility] or an office visit.  Thank you

## 2020-03-09 NOTE — Telephone Encounter (Signed)
Please call the patient and explain that issue AND I want to clarify her fathjer's history.  When last seen there was ? Of how old dad was when got colon cancer.  If it was after early 60's she can wait until 2026.  Thanks - I will wait hear and decide but you can cancel previsit + colonoscopy for now

## 2020-03-09 NOTE — Telephone Encounter (Signed)
Thank you Terri  She should go back to see Dr. [name] in cardiology regarding her aortic stenosis.  She saw him last summer and I think he wanted to have seen her by now and that would be important before we put her to sleep for any colonoscopy.  She can make an appointment to see me in the office when she is seen cardiology and they have given her the okay to go ahead with colonoscopy.  It is quite possible she could need some work done on her aortic valve before that I think.  That is definitely more important for her health.  Lets go ahead and place a new colonoscopy recall for September of this year as a backup

## 2020-03-09 NOTE — Telephone Encounter (Signed)
Discussed with pt that Dr. Carlean Purl wants her to follow up with her cardiologist before having a colonoscopy.  Once she has completed this to please call back to schedule colon procedure, based on the outcome of the visit with Dr. [name].

## 2020-03-09 NOTE — Telephone Encounter (Signed)
Attempted to reach pt to ask question that Dr. Carlean Purl requested-at what age was pt's father when he was dx with colon cancer and inform her that procedure cancelled at [facility] d/t severe aortic stenosis  Once we know the answer, let Dr. Carlean Purl know and he will decide if she needs an OV or direct to [facility]  Unable to reach pt-VM left requesting a call back.

## 2020-03-09 NOTE — Telephone Encounter (Signed)
Dr. Carlean Purl:  Per Jenny Reichmann d/t the severe AVS he recommends doing procedure at [facility]  Would you like an OV or to schedule a direct to [facility]?  Thank you

## 2020-03-10 ENCOUNTER — Telehealth: Payer: Self-pay

## 2020-03-10 NOTE — Telephone Encounter (Signed)
   Telephone encounter was:  Successful.  03/10/2020 Name: Rose Miller MRN: [id number] DOB: 12-18-1955  Rose Miller is a 65 y.o. year old female who is a primary care patient of [name], [name], NP . The community resource team was consulted for assistance with medication assistance  Care guide performed the following interventions: Follow up call placed to the patient to discuss status of referral  patient received the ozempic form in the mail and is aware she will need to take it to the weight loss clinic to have them fill out the prescriber portion of the form.  Follow Up Plan:  No further follow up planned at this time. The patient has been provided with needed resources.  Sharyn Brilliant, AAS Paralegal, [place] . Embedded Care Coordination [facility]  Care Management  [address], [place] [address] ??[email or web address]  ?? [phone]   [email or web address]

## 2020-03-14 ENCOUNTER — Other Ambulatory Visit: Payer: Self-pay | Admitting: Nurse Practitioner

## 2020-03-14 DIAGNOSIS — E785 Hyperlipidemia, unspecified: Secondary | ICD-10-CM

## 2020-03-14 NOTE — Telephone Encounter (Signed)
Left detailed message to call back and schedule an appointment around 06/05/2020 per [facility].

## 2020-03-15 ENCOUNTER — Other Ambulatory Visit: Payer: Self-pay

## 2020-03-15 ENCOUNTER — Ambulatory Visit (INDEPENDENT_AMBULATORY_CARE_PROVIDER_SITE_OTHER): Payer: Medicare Other | Admitting: Family Medicine

## 2020-03-15 ENCOUNTER — Encounter (INDEPENDENT_AMBULATORY_CARE_PROVIDER_SITE_OTHER): Payer: Self-pay | Admitting: Family Medicine

## 2020-03-15 VITALS — BP 121/74 | HR 73 | Temp 98.5°F

## 2020-03-15 DIAGNOSIS — I152 Hypertension secondary to endocrine disorders: Secondary | ICD-10-CM

## 2020-03-15 DIAGNOSIS — Z6841 Body Mass Index (BMI) 40.0 and over, adult: Secondary | ICD-10-CM | POA: Diagnosis not present

## 2020-03-15 DIAGNOSIS — E1159 Type 2 diabetes mellitus with other circulatory complications: Secondary | ICD-10-CM | POA: Diagnosis not present

## 2020-03-15 DIAGNOSIS — E1165 Type 2 diabetes mellitus with hyperglycemia: Secondary | ICD-10-CM | POA: Diagnosis not present

## 2020-03-15 MED ORDER — BD PEN NEEDLE NANO 2ND GEN 32G X 4 MM MISC: 1.0000 | Freq: Two times a day (BID) | 0 refills | Status: DC

## 2020-03-16 ENCOUNTER — Encounter (INDEPENDENT_AMBULATORY_CARE_PROVIDER_SITE_OTHER): Payer: Self-pay

## 2020-03-16 ENCOUNTER — Other Ambulatory Visit (INDEPENDENT_AMBULATORY_CARE_PROVIDER_SITE_OTHER): Payer: Self-pay | Admitting: Family Medicine

## 2020-03-16 DIAGNOSIS — E1165 Type 2 diabetes mellitus with hyperglycemia: Secondary | ICD-10-CM

## 2020-03-16 NOTE — Telephone Encounter (Signed)
Message sent to pt-CAS 

## 2020-03-16 NOTE — Progress Notes (Signed)
Chief Complaint:   OBESITY Rose Miller is here to discuss her progress with her obesity treatment plan along with follow-up of her obesity related diagnoses. Rose Miller is on keeping a food journal and adhering to recommended goals of 1150-1250 calories and 80+ protein and states she is following her eating plan approximately 0% of the time. Rose Miller states she is doing 0 minutes 0 times per week.  Today's visit was #: 4 Starting weight: 336 lbs Starting date: 02/02/2020 Today's weight: 338 lbs Today's date: 03/15/2020 Total lbs lost to date: 0 Total lbs lost since last in-office visit: 0  Interim History: Rose Miller voices that she Miller not been journaling. She hasn't been doing what she needs to do all the time but is doing ir sometimes. She realizes she isn't always getting enough protein in. She states she dreaded coming here today with concerns of not following the plan as strictly. She plans to go grocery shopping tomorrow.  Subjective:   1. Type 2 diabetes mellitus with hyperglycemia, without long-term current use of insulin (Rose Miller) Rose Miller is awaiting Ozempic paperwork that was sent to PCP. She is not currently on medication.  2. Hypertension associated with diabetes (Rose Miller) Rose Miller BP is controlled today. She denies chest pain, chest pressure and headache.  BP Readings from Last 3 Encounters:  03/15/20 121/74  03/02/20 115/71  02/22/20 110/70    Assessment/Plan:   1. Type 2 diabetes mellitus with hyperglycemia, without long-term current use of insulin (Rose Miller) Good blood sugar control is important to decrease the likelihood of diabetic complications such as nephropathy, neuropathy, limb loss, blindness, coronary artery disease, and death. Intensive lifestyle modification including diet, exercise and weight loss are the first line of treatment for diabetes. Fill out paperwork work pt brings it to next appointment.  - Insulin Pen Needle (BD PEN NEEDLE NANO 2ND GEN) 32G X 4 MM MISC; 1  Package by Does not apply route 2 (two) times daily.  Dispense: 100 each; Refill: 0  2. Hypertension associated with diabetes (Rose Miller) Rose Miller is working on healthy weight loss and exercise to improve blood pressure control. We will watch for signs of hypotension as she continues her lifestyle modifications. Continue amlodipine and Hyzaar.  3. Class 3 severe obesity with serious comorbidity and body mass index (BMI) of 45.0 to 49.9 in adult, unspecified obesity type (Rose Miller) Rose Miller is currently in the action stage of change. As such, her goal is to continue with weight loss efforts. She Miller Miller to keeping a food journal and adhering to recommended goals of 1150-1250 calories and 80+ g protein.   Rose Miller is to break up the next 2 weeks into smaller more manageable bits.  Exercise goals: All adults should avoid inactivity. Some physical activity is better than none, and adults who participate in any amount of physical activity gain some health benefits.  Behavioral modification strategies: increasing lean protein intake, meal planning and cooking strategies, planning for success and keeping a strict food journal.  Rose Miller to follow-up with our clinic in 3 weeks. She was informed of the importance of frequent follow-up visits to maximize her success with intensive lifestyle modifications for her multiple health conditions.   Objective:   Blood pressure 121/74, pulse 73, temperature 98.5 F (36.9 C), temperature source Oral, SpO2 98 %. There is no height or weight on file to calculate BMI.  General: Cooperative, alert, well developed, in no acute distress. HEENT: Conjunctivae and lids unremarkable. Cardiovascular: Regular rhythm.  Lungs: Normal work of breathing.  Neurologic: No focal deficits.   Lab Results  Component Value Date   CREATININE 1.10 (H) 02/02/2020   BUN 11 02/02/2020   NA 144 02/02/2020   K 3.9 02/02/2020   CL 103 02/02/2020   CO2 23 02/02/2020   Lab Results   Component Value Date   ALT 13 02/02/2020   AST 15 02/02/2020   ALKPHOS 68 02/02/2020   BILITOT 0.3 02/02/2020   Lab Results  Component Value Date   HGBA1C 6.7 (H) 11/17/2019   HGBA1C 6.2 02/17/2019   HGBA1C 6.7 (H) 10/02/2017   HGBA1C 6.7 (H) 03/31/2017   HGBA1C 6.3 03/29/2016   Lab Results  Component Value Date   INSULIN 12.0 02/02/2020   Lab Results  Component Value Date   TSH 1.140 02/02/2020   Lab Results  Component Value Date   CHOL 125 11/17/2019   HDL 51.50 11/17/2019   LDLCALC 55 11/17/2019   TRIG 90.0 11/17/2019   CHOLHDL 2 11/17/2019   Lab Results  Component Value Date   WBC 3.8 02/02/2020   HGB 11.4 02/02/2020   HCT 35.2 02/02/2020   MCV 85 02/02/2020   PLT 263 02/02/2020   No results found for: IRON, TIBC, FERRITIN  Obesity Behavioral Intervention:   Approximately 15 minutes were spent on the discussion below.  ASK: We discussed the diagnosis of obesity with Rose Miller today and Rose Miller Miller to give [place] permission to discuss obesity behavioral modification therapy today.  ASSESS: Rose Miller Miller the diagnosis of obesity and her BMI today is 48.6. Rose Miller is in the action stage of change.   ADVISE: Rose Miller was educated on the multiple health risks of obesity as well as the benefit of weight loss to improve her health. She was advised of the need for long term treatment and the importance of lifestyle modifications to improve her current health and to decrease her risk of future health problems.  AGREE: Multiple dietary modification options and treatment options were discussed and Rose Miller Miller to follow the recommendations documented in the above note.  ARRANGE: Rose Miller was educated on the importance of frequent visits to treat obesity as outlined per CMS and USPSTF guidelines and Miller to schedule her next follow up appointment today.  Attestation Statements:   Reviewed by clinician on day of visit: allergies, medications, problem list, medical  history, surgical history, family history, social history, and previous encounter notes.  Coral Ceo, am acting as transcriptionist for Coralie Common, MD.   I have reviewed the above documentation for accuracy and completeness, and I agree with the above. - Jinny Blossom, MD

## 2020-03-28 ENCOUNTER — Encounter: Payer: Medicare Other | Admitting: Internal Medicine

## 2020-04-03 ENCOUNTER — Encounter (INDEPENDENT_AMBULATORY_CARE_PROVIDER_SITE_OTHER): Payer: Self-pay | Admitting: Adult Health

## 2020-04-03 ENCOUNTER — Other Ambulatory Visit: Payer: Self-pay

## 2020-04-03 ENCOUNTER — Ambulatory Visit (INDEPENDENT_AMBULATORY_CARE_PROVIDER_SITE_OTHER): Payer: Medicare Other | Admitting: Adult Health

## 2020-04-03 VITALS — BP 135/70 | HR 87 | Temp 97.8°F | Ht 70.0 in | Wt 339.0 lb

## 2020-04-03 DIAGNOSIS — Z6841 Body Mass Index (BMI) 40.0 and over, adult: Secondary | ICD-10-CM | POA: Diagnosis not present

## 2020-04-03 DIAGNOSIS — N1831 Chronic kidney disease, stage 3a: Secondary | ICD-10-CM

## 2020-04-03 DIAGNOSIS — E1165 Type 2 diabetes mellitus with hyperglycemia: Secondary | ICD-10-CM

## 2020-04-03 MED ORDER — OZEMPIC (0.25 OR 0.5 MG/DOSE) 2 MG/1.5ML ~~LOC~~ SOPN: 0.2500 mg | PEN_INJECTOR | SUBCUTANEOUS | 0 refills | Status: DC

## 2020-04-04 LAB — HEMOGLOBIN A1C
Est. average glucose Bld gHb Est-mCnc: 123 mg/dL
Hgb A1c MFr Bld: 5.9 % — ABNORMAL HIGH (ref 4.8–5.6)

## 2020-04-04 NOTE — Progress Notes (Signed)
Chief Complaint:   OBESITY Rose Miller is here to discuss her progress with her obesity treatment plan along with follow-up of her obesity related diagnoses. Rose Miller is on keeping a food journal and adhering to recommended goals of 1150-1250 calories and 80+ g protein and states she is following her eating plan approximately 0% of the time. Rose Miller states she is doing 0 minutes 0 times per week.  Today's visit was #: 5 Starting weight: 336 lbs Starting date: 02/02/2020 Today's weight: 339 lbs Today's date: 04/03/2020 Total lbs lost to date: 0 Total lbs lost since last in-office visit: 0  Interim History: Darriana started Ozempic 0.25 mg once weekly on 02/16/2020. She never titrated up. She denies GI upset. She reports polyphagia most days of the week, however, she is not tracking intake or increasing protein intake.  Subjective:   1. Type 2 diabetes mellitus with hyperglycemia, without long-term current use of insulin ([place]) Rose Miller has been on Ozempic 0.25 mg since 02/16/2020. She is not checking ambulatory fasting blood glucose. She denies mass in neck, dysphagia, dyspepsia, or persistent hoarseness. She needs financial assistance paperwork completed for Ozempic.  Lab Results  Component Value Date   HGBA1C 5.9 (H) 04/03/2020   HGBA1C 6.7 (H) 11/17/2019   HGBA1C 6.2 02/17/2019   Lab Results  Component Value Date   MICROALBUR 1.1 02/17/2019   LDLCALC 55 11/17/2019   CREATININE 1.10 (H) 02/02/2020   Lab Results  Component Value Date   INSULIN 12.0 02/02/2020    2. Stage 3a chronic kidney disease ([place]) Rose Miller's 02/02/2020 GFR was stable. It is slightly improved from [facility] 11/17/2019.  Lab Results  Component Value Date   CREATININE 1.10 (H) 02/02/2020   CREATININE 1.22 (H) 11/17/2019   CREATININE 0.85 05/17/2019   Lab Results  Component Value Date   CREATININE 1.10 (H) 02/02/2020   BUN 11 02/02/2020   NA 144 02/02/2020   K 3.9 02/02/2020   CL 103 02/02/2020   CO2 23  02/02/2020    Assessment/Plan:   1. Type 2 diabetes mellitus with hyperglycemia, without long-term current use of insulin (HCC) Good blood sugar control is important to decrease the likelihood of diabetic complications such as nephropathy, neuropathy, limb loss, blindness, coronary artery disease, and death. Intensive lifestyle modification including diet, exercise and weight loss are the first line of treatment for diabetes. Check labs today.  Dianely completed [employer] program for [employer]- forms given to pt.   - Hemoglobin A1c  - Semaglutide,0.25 or 0.5MG /DOS, (OZEMPIC, 0.25 OR 0.5 MG/DOSE,) 2 MG/1.5ML SOPN; Inject 0.25 mg into the skin once a week.  Dispense: 1.5 mL; Refill: 0  2. Stage 3a chronic kidney disease ([place]) Lab results and trends reviewed. We discussed several lifestyle modifications today and she will continue to work on diet, exercise and weight loss efforts. Avoid nephrotoxic medications. Orders and follow up as documented in patient record. Avoid NSAIDs. Check labs every 3 months.  Counseling . Chronic kidney disease (CKD) happens when the kidneys are damaged over a long period of time. . Most of the time, this condition does not go away, but it can usually be controlled. Steps must be taken to slow down the kidney damage or to stop it from getting worse. . Intensive lifestyle modifications are the first line treatment for this issue.  [name] Kitchen Avoid buying foods that are: processed, frozen, or prepackaged to avoid excess salt.  3. Class 3 severe obesity with serious comorbidity and body mass index (BMI)  of 45.0 to 49.9 in adult, unspecified obesity type [facility]) Rose Miller is currently in the action stage of change. As such, her goal is to continue with weight loss efforts. She has agreed to keeping a food journal and adhering to recommended goals of 1150-1250 calories and 80 protein.   Kimberlye completed [employer] program for [employer]- forms given to  pt.  Focus on tracking intake and hitting cal/protein goals the next 2-3 weeks.  Exercise goals: No exercise has been prescribed at this time.  Behavioral modification strategies: increasing lean protein intake, decreasing simple carbohydrates, no skipping meals, meal planning and cooking strategies, planning for success and keeping a strict food journal.  Rose Miller has agreed to follow-up with our clinic in 2 weeks. She was informed of the importance of frequent follow-up visits to maximize her success with intensive lifestyle modifications for her multiple health conditions.   Objective:   Blood pressure 135/70, pulse 87, temperature 97.8 F (36.6 C), height 5\' 10"  (1.778 m), weight (!) 339 lb (153.8 kg), SpO2 98 %. Body mass index is 48.64 kg/m.  General: Cooperative, alert, well developed, in no acute distress. HEENT: Conjunctivae and lids unremarkable. Cardiovascular: Regular rhythm.  Lungs: Normal work of breathing. Neurologic: No focal deficits.   Lab Results  Component Value Date   CREATININE 1.10 (H) 02/02/2020   BUN 11 02/02/2020   NA 144 02/02/2020   K 3.9 02/02/2020   CL 103 02/02/2020   CO2 23 02/02/2020   Lab Results  Component Value Date   ALT 13 02/02/2020   AST 15 02/02/2020   ALKPHOS 68 02/02/2020   BILITOT 0.3 02/02/2020   Lab Results  Component Value Date   HGBA1C 5.9 (H) 04/03/2020   HGBA1C 6.7 (H) 11/17/2019   HGBA1C 6.2 02/17/2019   HGBA1C 6.7 (H) 10/02/2017   HGBA1C 6.7 (H) 03/31/2017   Lab Results  Component Value Date   INSULIN 12.0 02/02/2020   Lab Results  Component Value Date   TSH 1.140 02/02/2020   Lab Results  Component Value Date   CHOL 125 11/17/2019   HDL 51.50 11/17/2019   LDLCALC 55 11/17/2019   TRIG 90.0 11/17/2019   CHOLHDL 2 11/17/2019   Lab Results  Component Value Date   WBC 3.8 02/02/2020   HGB 11.4 02/02/2020   HCT 35.2 02/02/2020   MCV 85 02/02/2020   PLT 263 02/02/2020   No results found for: IRON, TIBC,  FERRITIN  Obesity Behavioral Intervention:   Approximately 15 minutes were spent on the discussion below.  ASK: We discussed the diagnosis of obesity with [name] today and Caitlyn agreed to give [place] permission to discuss obesity behavioral modification therapy today.  ASSESS: Hazelle has the diagnosis of obesity and her BMI today is 48.7. Kemonie is in the action stage of change.   ADVISE: Shanee was educated on the multiple health risks of obesity as well as the benefit of weight loss to improve her health. She was advised of the need for long term treatment and the importance of lifestyle modifications to improve her current health and to decrease her risk of future health problems.  AGREE: Multiple dietary modification options and treatment options were discussed and Keonda agreed to follow the recommendations documented in the above note.  ARRANGE: Lyndee was educated on the importance of frequent visits to treat obesity as outlined per CMS and USPSTF guidelines and agreed to schedule her next follow up appointment today.  Attestation Statements:   Reviewed by clinician  on day of visit: allergies, medications, problem list, medical history, surgical history, family history, social history, and previous encounter notes.  Coral Ceo, am acting as Location manager for Mina Marble, NP.  I have reviewed the above documentation for accuracy and completeness, and I agree with the above. -  Aaryn Sermon d. Charlesa Ehle, NP-C

## 2020-04-17 ENCOUNTER — Ambulatory Visit: Payer: Medicare Other | Admitting: Interventional Cardiology

## 2020-04-18 LAB — HM DIABETES EYE EXAM

## 2020-04-24 ENCOUNTER — Encounter (INDEPENDENT_AMBULATORY_CARE_PROVIDER_SITE_OTHER): Payer: Self-pay | Admitting: Adult Health

## 2020-04-24 ENCOUNTER — Other Ambulatory Visit: Payer: Self-pay

## 2020-04-24 ENCOUNTER — Ambulatory Visit (INDEPENDENT_AMBULATORY_CARE_PROVIDER_SITE_OTHER): Payer: Medicare Other | Admitting: Adult Health

## 2020-04-24 VITALS — BP 126/65 | HR 66 | Temp 97.3°F | Ht 70.0 in | Wt 340.0 lb

## 2020-04-24 DIAGNOSIS — I152 Hypertension secondary to endocrine disorders: Secondary | ICD-10-CM | POA: Insufficient documentation

## 2020-04-24 DIAGNOSIS — Z6841 Body Mass Index (BMI) 40.0 and over, adult: Secondary | ICD-10-CM

## 2020-04-24 DIAGNOSIS — E1159 Type 2 diabetes mellitus with other circulatory complications: Secondary | ICD-10-CM | POA: Insufficient documentation

## 2020-04-24 DIAGNOSIS — E1165 Type 2 diabetes mellitus with hyperglycemia: Secondary | ICD-10-CM | POA: Diagnosis not present

## 2020-04-24 MED ORDER — OZEMPIC (0.25 OR 0.5 MG/DOSE) 2 MG/1.5ML ~~LOC~~ SOPN: 0.5000 mg | PEN_INJECTOR | SUBCUTANEOUS | 0 refills | Status: DC

## 2020-04-25 NOTE — Progress Notes (Signed)
Chief Complaint:   OBESITY Rose Miller is here to discuss her progress with her obesity treatment plan along with follow-up of her obesity related diagnoses. Rose Miller is on keeping a food journal and adhering to recommended goals of 1150-1250 calories and 80 g protein and states she is following her eating plan approximately 40% of the time. Rose Miller states she is not currently exercising.  Today's visit was #: 6 Starting weight: 336 lbs Starting date: 02/02/2020 Today's weight: 340 lbs Today's date: 04/24/2020 Total lbs lost to date: 0 Total lbs lost since last in-office visit: 0  Interim History: Rose Miller has started Ozempic 0.25 mg once weekly. Her first dose was on or around 02/16/2020. She has had 7 doses and tolerating GLP-1 well, however still experiencing late afternoon polyphagia.  Subjective:   1. Type 2 diabetes mellitus with hyperglycemia, without long-term current use of insulin ([place]) Rose Miller has had 7 doses of Ozempic 0.25 mg once weekly. She reports increased polyphagia the last 2 weeks. Her A1c was 5.9 on 04/03/2020, and BG of 116 with insulin level 12.0 on 02/02/2020.  She denies mass in neck, dysphagia, dyspepsia, and persistent hoarseness.  Lab Results  Component Value Date   HGBA1C 5.9 (H) 04/03/2020   HGBA1C 6.7 (H) 11/17/2019   HGBA1C 6.2 02/17/2019   Lab Results  Component Value Date   MICROALBUR 1.1 02/17/2019   LDLCALC 55 11/17/2019   CREATININE 1.10 (H) 02/02/2020   Lab Results  Component Value Date   INSULIN 12.0 02/02/2020    Ref. Range 02/02/2020 11:38  Glucose Latest Ref Range: 65 - 99 mg/dL 116 (H)    2. Hypertension associated with diabetes ([place]) Rose Miller's BP/HR are excellent at OV. She is on Hyzaar 100-12.5 mg QD and amlodipine 10 mg QD. She denies cardiac symptoms.  BP Readings from Last 3 Encounters:  04/24/20 126/65  04/03/20 135/70  03/15/20 121/74    Assessment/Plan:   1. Type 2 diabetes mellitus with hyperglycemia, without long-term  current use of insulin (HCC) Good blood sugar control is important to decrease the likelihood of diabetic complications such as nephropathy, neuropathy, limb loss, blindness, coronary artery disease, and death. Intensive lifestyle modification including diet, exercise and weight loss are the first line of treatment for diabetes. Increase Ozempic to 0.5 mg once weekly, as prescribed below.  - Semaglutide,0.25 or 0.5MG /DOS, (OZEMPIC, 0.25 OR 0.5 MG/DOSE,) 2 MG/1.5ML SOPN; Inject 0.5 mg into the skin once a week.  Dispense: 3 mL; Refill: 0  2. Hypertension associated with diabetes ([place]) Rose Miller is working on healthy weight loss and exercise to improve blood pressure control. We will watch for signs of hypotension as she continues her lifestyle modifications. Continue current anti-hypertensive regimen.  3. Class 3 severe obesity with serious comorbidity and body mass index (BMI) of 45.0 to 49.9 in adult, unspecified obesity type (HCC) Rose Miller is currently in the action stage of change. As such, her goal is to continue with weight loss efforts. She has agreed to keeping a food journal and adhering to recommended goals of 1150-1250 calories and 80 g protein.   Exercise goals: No exercise has been prescribed at this time.  Behavioral modification strategies: increasing lean protein intake, meal planning and cooking strategies, planning for success and keeping a strict food journal.  Rose Miller has agreed to follow-up with our clinic in 2-3 weeks. She was informed of the importance of frequent follow-up visits to maximize her success with intensive lifestyle modifications for her multiple health conditions.   Objective:  Blood pressure 126/65, pulse 66, temperature (!) 97.3 F (36.3 C), height 5\' 10"  (1.778 m), weight (!) 340 lb (154.2 kg), SpO2 97 %. Body mass index is 48.78 kg/m.  General: Cooperative, alert, well developed, in no acute distress. HEENT: Conjunctivae and lids  unremarkable. Cardiovascular: Regular rhythm.  Lungs: Normal work of breathing. Neurologic: No focal deficits.   Lab Results  Component Value Date   CREATININE 1.10 (H) 02/02/2020   BUN 11 02/02/2020   NA 144 02/02/2020   K 3.9 02/02/2020   CL 103 02/02/2020   CO2 23 02/02/2020   Lab Results  Component Value Date   ALT 13 02/02/2020   AST 15 02/02/2020   ALKPHOS 68 02/02/2020   BILITOT 0.3 02/02/2020   Lab Results  Component Value Date   HGBA1C 5.9 (H) 04/03/2020   HGBA1C 6.7 (H) 11/17/2019   HGBA1C 6.2 02/17/2019   HGBA1C 6.7 (H) 10/02/2017   HGBA1C 6.7 (H) 03/31/2017   Lab Results  Component Value Date   INSULIN 12.0 02/02/2020   Lab Results  Component Value Date   TSH 1.140 02/02/2020   Lab Results  Component Value Date   CHOL 125 11/17/2019   HDL 51.50 11/17/2019   LDLCALC 55 11/17/2019   TRIG 90.0 11/17/2019   CHOLHDL 2 11/17/2019   Lab Results  Component Value Date   WBC 3.8 02/02/2020   HGB 11.4 02/02/2020   HCT 35.2 02/02/2020   MCV 85 02/02/2020   PLT 263 02/02/2020   No results found for: IRON, TIBC, FERRITIN  Obesity Behavioral Intervention:   Approximately 15 minutes were spent on the discussion below.  ASK: We discussed the diagnosis of obesity with Rose Miller today and Rose Miller agreed to give [place] permission to discuss obesity behavioral modification therapy today.  ASSESS: Rose Miller has the diagnosis of obesity and her BMI today is 48.8. Rose Miller is in the action stage of change.   ADVISE: Rose Miller was educated on the multiple health risks of obesity as well as the benefit of weight loss to improve her health. She was advised of the need for long term treatment and the importance of lifestyle modifications to improve her current health and to decrease her risk of future health problems.  AGREE: Multiple dietary modification options and treatment options were discussed and Rose Miller agreed to follow the recommendations documented in the above  note.  ARRANGE: Rose Miller was educated on the importance of frequent visits to treat obesity as outlined per CMS and USPSTF guidelines and agreed to schedule her next follow up appointment today.  Attestation Statements:   Reviewed by clinician on day of visit: allergies, medications, problem list, medical history, surgical history, family history, social history, and previous encounter notes.  Coral Ceo, am acting as Location manager for Mina Marble, NP.  I have reviewed the above documentation for accuracy and completeness, and I agree with the above. -  Brooklynn Brandenburg d. Threasa Kinch, NP-C

## 2020-05-03 ENCOUNTER — Other Ambulatory Visit: Payer: Self-pay

## 2020-05-03 ENCOUNTER — Ambulatory Visit (HOSPITAL_COMMUNITY): Payer: Medicare Other | Attending: Cardiovascular Disease

## 2020-05-03 DIAGNOSIS — I517 Cardiomegaly: Secondary | ICD-10-CM | POA: Insufficient documentation

## 2020-05-03 DIAGNOSIS — I350 Nonrheumatic aortic (valve) stenosis: Secondary | ICD-10-CM

## 2020-05-03 LAB — ECHOCARDIOGRAM COMPLETE
AR max vel: 1.21 cm2
AV Area VTI: 1.32 cm2
AV Area mean vel: 1.19 cm2
AV Mean grad: 26.7 mmHg
AV Peak grad: 46.6 mmHg
Ao pk vel: 3.41 m/s
Area-P 1/2: 3.42 cm2
P 1/2 time: 315 msec
S' Lateral: 2.6 cm

## 2020-05-04 ENCOUNTER — Telehealth: Payer: Self-pay | Admitting: Interventional Cardiology

## 2020-05-04 NOTE — Telephone Encounter (Signed)
Patient returning call for echo results. 

## 2020-05-04 NOTE — Telephone Encounter (Signed)
Informed pt of results. Pt verbalized understanding. 

## 2020-05-17 ENCOUNTER — Ambulatory Visit (INDEPENDENT_AMBULATORY_CARE_PROVIDER_SITE_OTHER): Payer: Medicare Other | Admitting: Adult Health

## 2020-05-17 ENCOUNTER — Other Ambulatory Visit: Payer: Self-pay

## 2020-05-17 ENCOUNTER — Encounter (INDEPENDENT_AMBULATORY_CARE_PROVIDER_SITE_OTHER): Payer: Self-pay | Admitting: Adult Health

## 2020-05-17 VITALS — BP 135/72 | HR 80 | Temp 98.1°F | Ht 70.0 in | Wt 341.0 lb

## 2020-05-17 DIAGNOSIS — E1159 Type 2 diabetes mellitus with other circulatory complications: Secondary | ICD-10-CM | POA: Diagnosis not present

## 2020-05-17 DIAGNOSIS — I152 Hypertension secondary to endocrine disorders: Secondary | ICD-10-CM | POA: Diagnosis not present

## 2020-05-17 DIAGNOSIS — E1165 Type 2 diabetes mellitus with hyperglycemia: Secondary | ICD-10-CM

## 2020-05-17 DIAGNOSIS — Z6841 Body Mass Index (BMI) 40.0 and over, adult: Secondary | ICD-10-CM

## 2020-05-18 NOTE — Progress Notes (Signed)
Chief Complaint:   OBESITY Rose Miller is here to discuss her progress with her obesity treatment plan along with follow-up of her obesity related diagnoses. Rose Miller is on keeping a food journal and adhering to recommended goals of 1150-1250 calories and 80 g protein and states she is following her eating plan approximately 50% of the time. Rose Miller states she is not currently exercising.  Today's visit was #: 7 Starting weight: 336 lbs Starting date: 02/02/2020 Today's weight: 341 lbs Today's date: 05/17/2020 Total lbs lost to date: 0 Total lbs lost since last in-office visit: 0  Interim History: Rose Miller is not following the plan- either category 2 or journaling- trying to make healthier choices and increasing protein.  She is on Ozempic 0.5mg  for diabetic control.  Subjective:   1. Type 2 diabetes mellitus with hyperglycemia, without long-term current use of insulin ([place]) Rose Miller is on Ozempic once a week since 02/16/2020 and has titrated up to 0.5 mg. Pt denies mass in neck, dysphagia, dyspepsia, persistent hoarseness, or GI upset. She does not check ambulatory BG but denies sx of hypoglycemia.  Lab Results  Component Value Date   HGBA1C 5.9 (H) 04/03/2020   HGBA1C 6.7 (H) 11/17/2019   HGBA1C 6.2 02/17/2019   Lab Results  Component Value Date   MICROALBUR 1.1 02/17/2019   LDLCALC 55 11/17/2019   CREATININE 1.10 (H) 02/02/2020   Lab Results  Component Value Date   INSULIN 12.0 02/02/2020    2. Hypertension associated with diabetes ([place]) BP/HR at goal at [place]. Pt denies cardiac symptoms. She is on Hyzaar 100/12.5 mg QD, hydralazine 25 mg BID, and Norvasc 10 mg QD.  BP Readings from Last 3 Encounters:  05/17/20 135/72  04/24/20 126/65  04/03/20 135/70    Assessment/Plan:   1. Type 2 diabetes mellitus with hyperglycemia, without long-term current use of insulin (HCC) Good blood sugar control is important to decrease the likelihood of diabetic complications such as  nephropathy, neuropathy, limb loss, blindness, coronary artery disease, and death. Intensive lifestyle modification including diet, exercise and weight loss are the first line of treatment for diabetes. Check ambulatory BG and bring log to follow up OV.  Continue Ozempic 0.5 mg once a week.  2. Hypertension associated with diabetes ([place]) Rose Miller is working on healthy weight loss and exercise to improve blood pressure control. We will watch for signs of hypotension as she continues her lifestyle modifications. Continue current anti-hypertensive regimen. Increase walking.  3. Class 3 severe obesity with serious comorbidity and body mass index (BMI) of 45.0 to 49.9 in adult, unspecified obesity type (HCC)  Rose Miller is currently in the action stage of change. As such, her goal is to continue with weight loss efforts. She has agreed to the Category 2 Plan.   Check fasting labs at the end of June.  Exercise goals: Walk with son at in the neighborhood as frequently as possible.  Behavioral modification strategies: increasing lean protein intake, meal planning and cooking strategies and planning for success.  Rose Miller has agreed to follow-up with our clinic fasting in 2 weeks. She was informed of the importance of frequent follow-up visits to maximize her success with intensive lifestyle modifications for her multiple health conditions.   Objective:   Blood pressure 135/72, pulse 80, temperature 98.1 F (36.7 C), height 5\' 10"  (1.778 m), weight (!) 341 lb (154.7 kg), SpO2 96 %. Body mass index is 48.93 kg/m.  General: Cooperative, alert, well developed, in no acute distress. HEENT: Conjunctivae and lids  unremarkable. Cardiovascular: Regular rhythm.  Lungs: Normal work of breathing. Neurologic: No focal deficits.   Lab Results  Component Value Date   CREATININE 1.10 (H) 02/02/2020   BUN 11 02/02/2020   NA 144 02/02/2020   K 3.9 02/02/2020   CL 103 02/02/2020   CO2 23 02/02/2020   Lab  Results  Component Value Date   ALT 13 02/02/2020   AST 15 02/02/2020   ALKPHOS 68 02/02/2020   BILITOT 0.3 02/02/2020   Lab Results  Component Value Date   HGBA1C 5.9 (H) 04/03/2020   HGBA1C 6.7 (H) 11/17/2019   HGBA1C 6.2 02/17/2019   HGBA1C 6.7 (H) 10/02/2017   HGBA1C 6.7 (H) 03/31/2017   Lab Results  Component Value Date   INSULIN 12.0 02/02/2020   Lab Results  Component Value Date   TSH 1.140 02/02/2020   Lab Results  Component Value Date   CHOL 125 11/17/2019   HDL 51.50 11/17/2019   LDLCALC 55 11/17/2019   TRIG 90.0 11/17/2019   CHOLHDL 2 11/17/2019   Lab Results  Component Value Date   WBC 3.8 02/02/2020   HGB 11.4 02/02/2020   HCT 35.2 02/02/2020   MCV 85 02/02/2020   PLT 263 02/02/2020    Attestation Statements:   Reviewed by clinician on day of visit: allergies, medications, problem list, medical history, surgical history, family history, social history, and previous encounter notes.  Time spent on visit including pre-visit chart review and post-visit care and charting was 30 minutes.   Coral Ceo, CMA, am acting as transcriptionist for Mina Marble, NP.  I have reviewed the above documentation for accuracy and completeness, and I agree with the above. -  Terren Haberle d. Leontine Radman, NP-C

## 2020-06-05 ENCOUNTER — Ambulatory Visit: Payer: Medicare Other | Admitting: Nurse Practitioner

## 2020-06-06 ENCOUNTER — Ambulatory Visit (INDEPENDENT_AMBULATORY_CARE_PROVIDER_SITE_OTHER): Payer: Medicare Other | Admitting: Nurse Practitioner

## 2020-06-06 ENCOUNTER — Other Ambulatory Visit: Payer: Self-pay

## 2020-06-06 ENCOUNTER — Encounter: Payer: Self-pay | Admitting: Nurse Practitioner

## 2020-06-06 VITALS — BP 114/60 | HR 84 | Temp 97.6°F | Ht 71.0 in | Wt 344.8 lb

## 2020-06-06 DIAGNOSIS — E7800 Pure hypercholesterolemia, unspecified: Secondary | ICD-10-CM

## 2020-06-06 DIAGNOSIS — E785 Hyperlipidemia, unspecified: Secondary | ICD-10-CM | POA: Diagnosis not present

## 2020-06-06 DIAGNOSIS — T502X5A Adverse effect of carbonic-anhydrase inhibitors, benzothiadiazides and other diuretics, initial encounter: Secondary | ICD-10-CM

## 2020-06-06 DIAGNOSIS — I10 Essential (primary) hypertension: Secondary | ICD-10-CM

## 2020-06-06 DIAGNOSIS — E876 Hypokalemia: Secondary | ICD-10-CM

## 2020-06-06 DIAGNOSIS — N1831 Chronic kidney disease, stage 3a: Secondary | ICD-10-CM

## 2020-06-06 DIAGNOSIS — N1832 Chronic kidney disease, stage 3b: Secondary | ICD-10-CM

## 2020-06-06 LAB — BASIC METABOLIC PANEL
BUN: 19 mg/dL (ref 6–23)
CO2: 25 mEq/L (ref 19–32)
Calcium: 9.1 mg/dL (ref 8.4–10.5)
Chloride: 101 mEq/L (ref 96–112)
Creatinine, Ser: 1.38 mg/dL — ABNORMAL HIGH (ref 0.40–1.20)
GFR: 39.91 mL/min — ABNORMAL LOW (ref >60.00)
Glucose, Bld: 121 mg/dL — ABNORMAL HIGH (ref 70–99)
Potassium: 3.3 mEq/L — ABNORMAL LOW (ref 3.5–5.1)
Sodium: 136 mEq/L (ref 135–145)

## 2020-06-06 LAB — LIPID PANEL
Cholesterol: 137 mg/dL (ref 0–200)
HDL: 51.6 mg/dL (ref >39.00)
LDL Cholesterol: 61 mg/dL (ref 0–99)
NonHDL: 85.33
Total CHOL/HDL Ratio: 3
Triglycerides: 123 mg/dL (ref 0.0–149.0)
VLDL: 24.6 mg/dL (ref 0.0–40.0)

## 2020-06-06 MED ORDER — LOSARTAN POTASSIUM-HCTZ 100-12.5 MG PO TABS: 1.0000 | ORAL_TABLET | Freq: Every day | ORAL | 3 refills | Status: DC

## 2020-06-06 NOTE — Progress Notes (Signed)
Cardiology Office Note   Date:  06/11/2020   ID:  Rose Miller, DOB 12-07-1955, MRN [id number]  PCP:  Flossie Buffy, NP  Cardiologist:  Dr. [name]     Chief Complaint  Patient presents with  . Aortic Stenosis      History of Present Illness: Rose Miller is a 65 y.o. female who presents for Aortic stenosis.   Last seen 06/15/19   She has a hx of obesity, severe hypertension, left ventricular hypertrophy, and moderate aortic stenosis.  When the echo done in October 2020 by Dr. Beatrix Fetters is compared to the 2019 echocardiogram from [facility], the transaortic valve peak velocity has increased from 3.38 m/s in 2019 to 3.71 m/sec in 2020.  This was discussed with the patient.  Dr. [name] also reviewed the stages of aortic stenosis labeled as mild, moderate, and severe based upon transvalvular velocity.  Recent echo 05/03/20 with EF 60-65%, moderate LVH. G2 DD.  LA moderately dilated. Mild MR, Moderate AR, and moderate AS.  Aortic valve mean gradient measures 26.7 mmHg. Aortic valve Vmax measures 3.41 m/s. Aortic valve acceleration time measures 106 msec.  Today she feels well. No significant SOB, no chest pain.  Recent A1c is 5.9, BP at home 116/73 to 116/70  She has colonoscopy in near future.   Past Medical History:  Diagnosis Date  . Adhesive capsulitis   . Allergy   . Aortic valve stenosis   . Arthritis   . Asthma   . Back pain   . Bilateral swelling of feet   . CKD (chronic kidney disease) stage 3, GFR 30-59 ml/min (HCC)   . Constipation   . Diabetes mellitus without complication ([place])   . Diuretic-induced hypokalemia   . Heart murmur   . Heartburn   . Hypercholesterolemia   . Hypertension   . IT band syndrome   . Joint pain   . Obesity   . Palpitations   . Periumbilical pain   . Postmenopausal estrogen deficiency   . Prediabetes   . Shoulder pain, right   . Swallowing difficulty   . Ventral hernia     Past Surgical History:  Procedure Laterality Date   . CESAREAN SECTION  1989  . COLONOSCOPY    . TONSILLECTOMY AND ADENOIDECTOMY  1960     Current Outpatient Medications  Medication Sig Dispense Refill  . amLODipine (NORVASC) 10 MG tablet TAKE 1 TABLET BY MOUTH  DAILY 90 tablet 3  . atorvastatin (LIPITOR) 10 MG tablet Take 1 tablet (10 mg total) by mouth daily. 90 tablet 3  . Bayer Microlet Lancets lancets Use when testing glucose as recommended.    . Blood Pressure Monitoring (SPHYGMOMANOMETER) MISC 1 Units by Does not apply route daily. 1 each 0  . Cholecalciferol 25 MCG (1000 UT) tablet Take 1,000 Units by mouth daily.    . Coenzyme Q10 (CO Q-10) 100 MG CAPS Take 100 mg by mouth daily.     . Cyanocobalamin (B-12) 2500 MCG TABS Take 1 tablet by mouth daily.     [name] Kitchen glucose blood (CONTOUR NEXT TEST) test strip Test up to 4 times per day as recommended    . hydrALAZINE (APRESOLINE) 25 MG tablet TAKE 1 TABLET BY MOUTH  TWICE DAILY 180 tablet 3  . Insulin Pen Needle (BD PEN NEEDLE NANO 2ND GEN) 32G X 4 MM MISC 1 Package by Does not apply route 2 (two) times daily. 100 each 0  . losartan (COZAAR) 100 MG tablet Take 1  tablet (100 mg total) by mouth daily. 90 tablet 3  . Semaglutide,0.25 or 0.5MG /DOS, (OZEMPIC, 0.25 OR 0.5 MG/DOSE,) 2 MG/1.5ML SOPN Inject 0.5 mg into the skin once a week. 3 mL 0  . vitamin C (ASCORBIC ACID) 500 MG tablet Take 500 mg by mouth daily.     . Zinc 50 MG CAPS Take by mouth.     No current facility-administered medications for this visit.    Allergies:   Patient has no known allergies.    Social History:  The patient  reports that she has quit smoking. She has never used smokeless tobacco. She reports that she does not drink alcohol and does not use drugs.   Family History:  The patient's family history includes Arthritis in her mother; Cancer in her father and mother; Colon cancer in her father; Heart disease in her father; Hypertension in her mother; Obesity in her mother; Thyroid disease in her father.     ROS:  General:no colds or fevers, no weight changes Skin:no rashes or ulcers HEENT:no blurred vision, no congestion CV:see HPI PUL:see HPI GI:no diarrhea constipation or melena, no indigestion GU:no hematuria, no dysuria MS:no joint pain, no claudication Neuro:no syncope, no lightheadedness Endo:+ diabetes, no thyroid disease  Wt Readings from Last 3 Encounters:  06/09/20 (!) 346 lb (156.9 kg)  06/07/20 (!) 340 lb (154.2 kg)  06/06/20 (!) 344 lb 12.8 oz (156.4 kg)     PHYSICAL EXAM: VS:  BP 138/82   Pulse 80   Ht 5\' 11"  (1.803 m)   Wt (!) 346 lb (156.9 kg)   SpO2 94%   BMI 48.26 kg/m  , BMI Body mass index is 48.26 kg/m. General:Pleasant affect, NAD Skin:Warm and dry, brisk capillary refill HEENT:normocephalic, sclera clear, mucus membranes moist Neck:supple, no JVD, no bruits  Heart:S1S2 RRR with 2-3/6 aortic murmur, no gallup, rub or click Lungs:clear without rales, rhonchi, or wheezes PRF:FMBW, non tender, + BS, do not palpate liver spleen or masses Ext:no lower ext edema, 2+ pedal pulses, 2+ radial pulses Neuro:alert and oriented X 3, MAE, follows commands, + facial symmetry    EKG:  EKG is not ordered today.    Recent Labs: 02/02/2020: ALT 13; Hemoglobin 11.4; Platelets 263; TSH 1.140 06/06/2020: BUN 19; Creatinine, Ser 1.38; Potassium 3.3; Sodium 136    Lipid Panel    Component Value Date/Time   CHOL 137 06/06/2020 0951   TRIG 123.0 06/06/2020 0951   HDL 51.60 06/06/2020 0951   CHOLHDL 3 06/06/2020 0951   VLDL 24.6 06/06/2020 0951   LDLCALC 61 06/06/2020 0951       Other studies Reviewed: Additional studies/ records that were reviewed today include: .  2D Doppler echocardiogram 2019: Study Conclusions   - Left ventricle: The cavity size was normal. There was moderate  concentric hypertrophy. Systolic function was normal. The  estimated ejection fraction was in the range of 60% to 65%. Wall  motion was normal; there were no regional  wall motion  abnormalities. Features are consistent with a pseudonormal left  ventricular filling pattern, with concomitant abnormal relaxation  and increased filling pressure (grade 2 diastolic dysfunction).  Doppler parameters are consistent with high ventricular filling  pressure.  - Aortic valve: Valve mobility was restricted. There was moderate  stenosis. There was mild regurgitation. Peak velocity (S): 338  cm/s. Mean gradient (S): 25 mm Hg. Valve area (VTI): 1.21 cm^2.  Valve area (Vmax): 1.14 cm^2. Valve area (Vmean): 1.26 cm^2.  - Mitral valve: Mildly calcified annulus.  Transvalvular velocity  was within the normal range. There was no evidence for stenosis.  There was trivial regurgitation.  - Left atrium: The atrium was moderately dilated.  - Right ventricle: The cavity size was normal. Wall thickness was  normal. Systolic function was normal.  - Atrial septum: No defect or patent foramen ovale was identified  by color flow Doppler.  - Tricuspid valve: There was mild regurgitation.  - Pulmonary arteries: Systolic pressure was mildly increased. PA  peak pressure: 41 mm Hg (S).  - Global longitudinal strain -16.6% (borderline).   ECHOCARDIOGRAM 2020:[facility] Result Narrative  Transthoracic Echocardiography Report (TTE)  Demographics  Patient Name   LESLIE JESTER Gender        Female  Patient Number  [id number]      Race         Black  Visit Number   [id number]    Room Number  Accession Number [id number]     Date of Study    11/09/2018  Date of Birth   1955/02/26    Referring Physician Mathis Bud, MD,                              [facility]  Age        41 year(s)    Sonographer     Tessie Fass, Josh RDCS,                              RVT                    Interpreting     Mathis Bud, MD,                     Physician      [facility] Procedure Type of Study  TTE procedure: CUS TTE SURFACE COMPLETE ECHO ADULT. Procedure date Date: 11/09/2018 Start: 08:03 AM Technical Quality: Adequate visualization Study Location: Echo Lab Indications: Aortic stenosis. Patient Status: Routine Height: 71 inches Weight: 356.01 pounds BSA: 2.69 m2 BMI: 49.65 kg/m2 Rhythm: Normal sinus rhythm HR: 63 bpm BP: 110/70 mmHg Conclusions Summary RVSP has improved vs. previous echo report from [facility] Moderately calcified aortic valve with reduced mobility. Moderate aortic stenosis. Mean pressure gradient of 30 mmHg. Aortic valve area of 1.14 cm squared. Dimensionless index: 40. Velocities through the aortic valve may be overestimated due to hyperdynamic function. Mild aortic regurgitation. Left ventricle cavity size is normal. Moderate concentric left ventricular hypertrophy. The left ventricular systolic function is hyperdynamic. Ejection fraction is visually estimated at >70%. Diastolic function is indeterminate. No regional wall motion abnormalities noted. Signature ------------------------------------------------------------------------------  Electronically signed by Mathis Bud, MD, FACC(Interpreting physician) on  11/09/2018 10:08 AM ------------------------------------------------------------------------------ Findings Mitral Valve Mild mitral annular calcification. The mitral valve leaflets are pliable and mobile. No evidence of mitral stenosis. Trace mitral regurgitation. Aortic Valve Moderately calcified aortic valve with reduced mobility. Moderate aortic stenosis. Mean pressure gradient of 30 mmHg. Aortic valve area of 1.14 cm squared. Dimensionless index: 40. Velocities through the aortic valve may be overestimated due to hyperdynamic function. Mild aortic regurgitation. Tricuspid Valve Tricuspid valve is structurally normal. No evidence of tricuspid stenosis. Trace tricuspid  regurgitation. Insufficient tricuspid regurgitation detected to calculate RV systolic pressure. Pulmonic Valve Pulmonic valve was not well visualized, but is grossly normal. No Doppler evidence of pulmonic stenosis or insufficiency. Left Atrium Normal size left atrium. Left  atrial volume index of 27 ml per meters squared BSA. Left Ventricle Left ventricle cavity size is normal. Moderate concentric left ventricular hypertrophy. The left ventricular systolic function is hyperdynamic. Ejection fraction is visually estimated at >70%. Diastolic function is indeterminate. No regional wall motion abnormalities noted. Right Atrium Normal size right atrium. Right Ventricle Normal right ventricular size and function. Pericardial Effusion No evidence of pericardial effusion. Pleural Effusion No pleural effusion seen. Miscellaneous The aortic root diameter is within normal limits. The IVC is normal. The interatrial septum appears intact with no evidence of shunt by color flow Doppler. M-Mode/2D Measurements & Calculations  LV Diastolic Dimension: LV Systolic Dimension:  AV Cusp Separation: 1.2  4.8 cm          2.7 cm          cmLA Dimension: 3.6 cmAO  LV FS:43.75 %      LV Volume Diastolic: 124 Root Dimension: 3.4 cmLA  LV PW Diastolic: 1.4 cm ml            Area: 22.8 cm2  Septum Diastolic: 1.4 cm LV Volume Systolic: 29  CO: 5.80 l/min      ml  CI: 2.21 l/m*m2     LV EDV/LV EDV Index: 107              ml/40 m2LV ESV/LV ESV  RV Diastolic Dimension:  LV Area Diastolic: 99.8 Index: 29 PJ/82 m2    3.3 cm  cm2           EF Calculated: 72.9 %  LV Area Systolic: 50.5  EF Estimated: 70 %    LA/Aorta: 1.06  cm2           LV Length: 8.48 cm    Ascending Aorta: 3.7 cm                           LA volume/Index: 71.8 ml              LVOT: 2.2 cm        /16m2 Doppler Measurements & Calculations  MV Peak E-Wave: 93.5   AV Peak Velocity: 371 cm/s  LVOT Peak Velocity:  cm/s           AV Peak Gradient: 55.06 mmHg 147 cm/s  MV Peak A-Wave: 92.4   AV Mean Velocity: 253 cm/s  LVOT Mean Velocity:  cm/s           AV Mean Gradient: 30 mmHg   95.6 cm/s  MV E/A Ratio: 1.01    AV VTI: 82.8 cm        LVOT Peak Gradient: 9  MV Peak Gradient: 3.5  AV Area (Continuity):1.14 cm2 mmHgLVOT Mean  mmHg                          Gradient: 4 mmHg              LVOT VTI: 24.8 cm       Estimated RAP:5 mmHg  MV Deceleration Time:  310 msec  MV P1/2t: 91 msec  MVA by PHT2.42 cm2  TDI E' Velocity: 4.9  cm/s  E/E' prime 19   Echo 05/03/2020 IMPRESSIONS    1. Left ventricular ejection fraction, by estimation, is 60 to 65%. The  left ventricle has normal function. The left ventricle has no regional  wall motion abnormalities. There is moderate concentric left ventricular  hypertrophy. Left ventricular  diastolic  parameters are consistent with Grade II diastolic dysfunction  (pseudonormalization). Elevated left atrial pressure.  2. Right ventricular systolic function is normal. The right ventricular  size is normal.  3. Left atrial size was moderately dilated.  4. The mitral valve is normal in structure. Mild mitral valve  regurgitation. No evidence of mitral stenosis.  5. The aortic valve is tricuspid. There is moderate calcification of the  aortic valve. There is severe thickening of the aortic valve. Aortic valve  regurgitation is moderate. Moderate aortic valve stenosis. Aortic  regurgitation PHT measures 315 msec.  Aortic valve mean gradient measures 26.7 mmHg. Aortic valve Vmax measures  3.41 m/s. Aortic valve acceleration time measures 106 msec.  6. The inferior vena cava is normal in size with greater than 50%  respiratory variability, suggesting right atrial  pressure of 3 mmHg.   ASSESSMENT AND PLAN:  1.  Moderate AR and AS.  Pt is asymptomatic.  Will repeat echo in 6 months.  I reviewed symptoms that should concern her.  She understands to notify us if the symptoms occur.  Follow up with Dr. [name] in 6 months after echo.   2. HTN controlled  3. HLD on statin, continue with goal LDL <70.  4.  Obesity, understands wt loss would improve overall health.   5.  Need for colonoscopy, she is asymptomatic with AS, AR and can walk up steps without acute SOB.  No chest pain.  She is low risk for complications with colonoscopy.       Current medicines are reviewed with the patient today.  The patient Has no concerns regarding medicines.  The following changes have been made:  See above Labs/ tests ordered today include:see above  Disposition:   FU:  see above  Signed, Cecilie Kicks, NP  06/11/2020 3:15 PM    [facility], [place], [place] Phone: [phone]; Fax: [phone]

## 2020-06-06 NOTE — Assessment & Plan Note (Signed)
Repeat lipid panel ?

## 2020-06-06 NOTE — Assessment & Plan Note (Addendum)
BP at goal with hydralazine, losartan/HCTZ and amlodipine  BP Readings from Last 3 Encounters:  06/06/20 114/60  05/17/20 135/72  04/24/20 126/65   Maintain medication dose and repeat BMP. Losartan/hctz to losartan due to decline in renal function and hypokalemia F/up in 82month

## 2020-06-06 NOTE — Progress Notes (Signed)
Subjective:  Patient ID: Rose Miller, female    DOB: 01-13-1956  Age: 65 y.o. MRN: [id number]  CC: Follow-up (6 month f/u on HTN and diabetes/Pt is fasting. )  HPI  Essential hypertension BP at goal with hydralazine, losartan/HCTZ and amlodipine  BP Readings from Last 3 Encounters:  06/06/20 114/60  05/17/20 135/72  04/24/20 126/65   Maintain medication dose and repeat BMP. Losartan/hctz to losartan due to decline in renal function and hypokalemia F/up in 34month  CKD (chronic kidney disease) stage 3, GFR 30-59 ml/min (HCC) Bilateral LE edema, but stable  Repeat BMP:  Continuous decline in renal function and low potassium. Entered referral to nephrology Changed losartan/hctz to losartan only. F/up with me in 24month   Hypercholesterolemia Repeat lipid panel  BP Readings from Last 3 Encounters:  06/07/20 116/73  06/06/20 114/60  05/17/20 135/72   Wt Readings from Last 3 Encounters:  06/07/20 (!) 340 lb (154.2 kg)  06/06/20 (!) 344 lb 12.8 oz (156.4 kg)  05/17/20 (!) 341 lb (154.7 kg)   Needs cardiology clearance before colonoscopy can be scheduled due to worsen aortic stenosis. She reports SOB with minimal activity like getting dressed. She does not participate in any other form of exercise at this time.  Reviewed past Medical, Social and Family history today.  Outpatient Medications Prior to Visit  Medication Sig Dispense Refill  . amLODipine (NORVASC) 10 MG tablet TAKE 1 TABLET BY MOUTH  DAILY 90 tablet 3  . Bayer Microlet Lancets lancets Use when testing glucose as recommended.    . Blood Pressure Monitoring (SPHYGMOMANOMETER) MISC 1 Units by Does not apply route daily. 1 each 0  . Cholecalciferol 25 MCG (1000 UT) tablet Take 1,000 Units by mouth daily.    . Coenzyme Q10 (CO Q-10) 100 MG CAPS Take 100 mg by mouth daily.     . Cyanocobalamin (B-12) 2500 MCG TABS Take 1 tablet by mouth daily.     [name] Kitchen glucose blood (CONTOUR NEXT TEST) test strip Test up to 4  times per day as recommended    . hydrALAZINE (APRESOLINE) 25 MG tablet TAKE 1 TABLET BY MOUTH  TWICE DAILY 180 tablet 3  . Insulin Pen Needle (BD PEN NEEDLE NANO 2ND GEN) 32G X 4 MM MISC 1 Package by Does not apply route 2 (two) times daily. 100 each 0  . Semaglutide,0.25 or 0.5MG /DOS, (OZEMPIC, 0.25 OR 0.5 MG/DOSE,) 2 MG/1.5ML SOPN Inject 0.5 mg into the skin once a week. 3 mL 0  . vitamin C (ASCORBIC ACID) 500 MG tablet Take 500 mg by mouth daily.     . Zinc 50 MG CAPS Take by mouth.    [name] Kitchen atorvastatin (LIPITOR) 10 MG tablet TAKE 1 TABLET BY MOUTH  DAILY 90 tablet 0  . losartan-hydrochlorothiazide (HYZAAR) 100-12.5 MG tablet Take 1 tablet by mouth daily. 90 tablet 3   No facility-administered medications prior to visit.    ROS See HPI  Objective:  BP 114/60 (BP Location: Left Arm, Patient Position: Sitting, Cuff Size: Large)   Pulse 84   Temp 97.6 F (36.4 C) (Temporal)   Ht 5\' 11"  (1.803 m)   Wt (!) 344 lb 12.8 oz (156.4 kg)   SpO2 95%   BMI 48.09 kg/m   Physical Exam Constitutional:      Appearance: She is obese.  Cardiovascular:     Rate and Rhythm: Normal rate and regular rhythm.     Pulses: Normal pulses.     Heart sounds: Murmur heard.  Pulmonary:     Effort: Pulmonary effort is normal.     Breath sounds: Normal breath sounds.  Musculoskeletal:     Right lower leg: Edema present.     Left lower leg: Edema present.  Skin:    Findings: No erythema or rash.  Neurological:     Mental Status: She is alert and oriented to person, place, and time.  Psychiatric:        Mood and Affect: Mood normal.        Behavior: Behavior normal.        Thought Content: Thought content normal.    Assessment & Plan:  This visit occurred during the SARS-CoV-2 public health emergency.  Safety protocols were in place, including screening questions prior to the visit, additional usage of staff PPE, and extensive cleaning of exam room while observing appropriate contact time as  indicated for disinfecting solutions.   Navneet was seen today for follow-up.  Diagnoses and all orders for this visit:  Stage 3b chronic kidney disease ([place]) -     Basic metabolic panel -     losartan (COZAAR) 100 MG tablet; Take 1 tablet (100 mg total) by mouth daily. -     Ambulatory referral to Nephrology  Essential hypertension -     Basic metabolic panel -     Discontinue: losartan-hydrochlorothiazide (HYZAAR) 100-12.5 MG tablet; Take 1 tablet by mouth daily. -     losartan (COZAAR) 100 MG tablet; Take 1 tablet (100 mg total) by mouth daily. -     Ambulatory referral to Nephrology  Hypercholesterolemia -     Lipid panel  Diuretic-induced hypokalemia -     Discontinue: losartan-hydrochlorothiazide (HYZAAR) 100-12.5 MG tablet; Take 1 tablet by mouth daily. -     losartan (COZAAR) 100 MG tablet; Take 1 tablet (100 mg total) by mouth daily.  Hyperlipidemia LDL goal <70 -     atorvastatin (LIPITOR) 10 MG tablet; Take 1 tablet (10 mg total) by mouth daily.   Problem List Items Addressed This Visit      Cardiovascular and Mediastinum   Essential hypertension    BP at goal with hydralazine, losartan/HCTZ and amlodipine  BP Readings from Last 3 Encounters:  06/06/20 114/60  05/17/20 135/72  04/24/20 126/65   Maintain medication dose and repeat BMP. Losartan/hctz to losartan due to decline in renal function and hypokalemia F/up in 7month      Relevant Medications   losartan (COZAAR) 100 MG tablet   atorvastatin (LIPITOR) 10 MG tablet   Other Relevant Orders   Basic metabolic panel (Completed)   Ambulatory referral to Nephrology     Genitourinary   CKD (chronic kidney disease) stage 3, GFR 30-59 ml/min (HCC) - Primary    Bilateral LE edema, but stable  Repeat BMP:  Continuous decline in renal function and low potassium. Entered referral to nephrology Changed losartan/hctz to losartan only. F/up with me in 73month       Relevant Medications   losartan (COZAAR)  100 MG tablet   Other Relevant Orders   Basic metabolic panel (Completed)   Ambulatory referral to Nephrology     Other   Diuretic-induced hypokalemia   Relevant Medications   losartan (COZAAR) 100 MG tablet   Hypercholesterolemia    Repeat lipid panel      Relevant Medications   losartan (COZAAR) 100 MG tablet   atorvastatin (LIPITOR) 10 MG tablet   Other Relevant Orders   Lipid panel (Completed)    Other Visit Diagnoses  Hyperlipidemia LDL goal <70       Relevant Medications   losartan (COZAAR) 100 MG tablet   atorvastatin (LIPITOR) 10 MG tablet      Follow-up: Return in about 4 weeks (around 07/04/2020) for HTN and edema.  Wilfred Lacy, NP

## 2020-06-06 NOTE — Assessment & Plan Note (Addendum)
Bilateral LE edema, but stable  Repeat BMP:  Continuous decline in renal function and low potassium. Entered referral to nephrology Changed losartan/hctz to losartan only. F/up with me in 38month

## 2020-06-06 NOTE — Patient Instructions (Signed)
Go to lab for blood draw  maintain upcoming appt with cardiology. Discuss when you can be cleared for colonoscopy. Let me know if you change your mind about pneumonia vaccine.

## 2020-06-07 ENCOUNTER — Encounter (INDEPENDENT_AMBULATORY_CARE_PROVIDER_SITE_OTHER): Payer: Self-pay | Admitting: Family Medicine

## 2020-06-07 ENCOUNTER — Ambulatory Visit (INDEPENDENT_AMBULATORY_CARE_PROVIDER_SITE_OTHER): Payer: Medicare Other | Admitting: Family Medicine

## 2020-06-07 VITALS — BP 116/73 | HR 81 | Temp 98.0°F | Ht 70.0 in | Wt 340.0 lb

## 2020-06-07 DIAGNOSIS — F3289 Other specified depressive episodes: Secondary | ICD-10-CM | POA: Diagnosis not present

## 2020-06-07 DIAGNOSIS — E1159 Type 2 diabetes mellitus with other circulatory complications: Secondary | ICD-10-CM

## 2020-06-07 DIAGNOSIS — E1165 Type 2 diabetes mellitus with hyperglycemia: Secondary | ICD-10-CM | POA: Diagnosis not present

## 2020-06-07 DIAGNOSIS — I152 Hypertension secondary to endocrine disorders: Secondary | ICD-10-CM | POA: Diagnosis not present

## 2020-06-07 DIAGNOSIS — Z6841 Body Mass Index (BMI) 40.0 and over, adult: Secondary | ICD-10-CM | POA: Diagnosis not present

## 2020-06-07 MED ORDER — ATORVASTATIN CALCIUM 10 MG PO TABS: 1.0000 | ORAL_TABLET | Freq: Every day | ORAL | 3 refills | Status: DC

## 2020-06-07 MED ORDER — LOSARTAN POTASSIUM 100 MG PO TABS: 100.0000 mg | ORAL_TABLET | Freq: Every day | ORAL | 3 refills | Status: DC

## 2020-06-07 NOTE — Addendum Note (Signed)
Addended by: Leana Gamer on: 06/07/2020 06:25 PM   Modules accepted: Level of Service
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
## 2020-06-09 ENCOUNTER — Ambulatory Visit: Payer: Medicare Other | Admitting: Cardiology

## 2020-06-09 ENCOUNTER — Other Ambulatory Visit: Payer: Self-pay

## 2020-06-09 ENCOUNTER — Encounter: Payer: Self-pay | Admitting: Cardiology

## 2020-06-09 ENCOUNTER — Ambulatory Visit: Payer: Medicare Other | Admitting: Interventional Cardiology

## 2020-06-09 VITALS — BP 138/82 | HR 80 | Ht 71.0 in | Wt 346.0 lb

## 2020-06-09 DIAGNOSIS — E785 Hyperlipidemia, unspecified: Secondary | ICD-10-CM

## 2020-06-09 DIAGNOSIS — I350 Nonrheumatic aortic (valve) stenosis: Secondary | ICD-10-CM | POA: Diagnosis not present

## 2020-06-09 DIAGNOSIS — I351 Nonrheumatic aortic (valve) insufficiency: Secondary | ICD-10-CM | POA: Diagnosis not present

## 2020-06-09 DIAGNOSIS — E1159 Type 2 diabetes mellitus with other circulatory complications: Secondary | ICD-10-CM | POA: Diagnosis not present

## 2020-06-09 DIAGNOSIS — I152 Hypertension secondary to endocrine disorders: Secondary | ICD-10-CM | POA: Diagnosis not present

## 2020-06-09 NOTE — Patient Instructions (Addendum)
Medication Instructions:. Your physician recommends that you continue on your current medications as directed. Please refer to the Current Medication list given to you today.  *If you need a refill on your cardiac medications before your next appointment, please call your pharmacy*   Lab Work: None ordered   If you have labs (blood work) drawn today and your tests are completely normal, you will receive your results only by: [name] Kitchen MyChart Message (if you have MyChart) OR . A paper copy in the mail If you have any lab test that is abnormal or we need to change your treatment, we will call you to review the results.   Testing/Procedures: Your physician has requested that you have an echocardiogram in  6 months. Echocardiography is a painless test that uses sound waves to create images of your heart. It provides your doctor with information about the size and shape of your heart and how well your heart's chambers and valves are working. This procedure takes approximately one hour. There are no restrictions for this procedure.     Follow-Up: Follow up with Dr. [name] in 6 months after echo    Other Instructions None

## 2020-06-10 ENCOUNTER — Other Ambulatory Visit (INDEPENDENT_AMBULATORY_CARE_PROVIDER_SITE_OTHER): Payer: Self-pay | Admitting: Adult Health

## 2020-06-10 DIAGNOSIS — E1165 Type 2 diabetes mellitus with hyperglycemia: Secondary | ICD-10-CM

## 2020-06-11 ENCOUNTER — Encounter: Payer: Self-pay | Admitting: Cardiology

## 2020-06-11 ENCOUNTER — Other Ambulatory Visit: Payer: Self-pay | Admitting: Nurse Practitioner

## 2020-06-11 DIAGNOSIS — Z1211 Encounter for screening for malignant neoplasm of colon: Secondary | ICD-10-CM

## 2020-06-13 NOTE — Progress Notes (Signed)
Chief Complaint:   OBESITY Rose Miller is here to discuss her progress with her obesity treatment plan along with follow-up of her obesity related diagnoses. Rose Miller is on the Category 2 Plan and states she is following her eating plan approximately 25% of the time. Rose Miller states she is not currently exercising.  Today's visit was #: 8 Starting weight: 336 lbs Starting date: 02/02/2020 Today's weight: 340 lbs Today's date: 06/07/2020 Total lbs lost to date: 0 Total lbs lost since last in-office visit: 1  Interim History: Rose Miller is struggling to stick to category 2. She voices she is eating less indulgent food. She is taking in salmon patties and mixed vegetables. She may be interested in a more specific structured plan. She is not sure what food is in her house.  Subjective:   1. Type 2 diabetes mellitus with hyperglycemia, without long-term current use of insulin (HCC) Rose Miller is on Ozempic with no GI side effects. Her A1c is 5.9 and insulin level 12.0.  2. Hypertension associated with diabetes ([place]) Rose Miller is on amlodipine, hydralazine, and Hyzaar.Pt denies chest pain/chest pressure/headache.  3. Other depression, with emotional eating Rose Miller is not on medication. She is  Having significant difficulty with adherence to meal plan.  Assessment/Plan:   1. Type 2 diabetes mellitus with hyperglycemia, without long-term current use of insulin (HCC) Good blood sugar control is important to decrease the likelihood of diabetic complications such as nephropathy, neuropathy, limb loss, blindness, coronary artery disease, and death. Intensive lifestyle modification including diet, exercise and weight loss are the first line of treatment for diabetes.  -Continue Ozempic. Pt denies need for refill today.  2. Hypertension associated with diabetes ([place]) Rose Miller is working on healthy weight loss and exercise to improve blood pressure control. We will watch for signs of hypotension as she  continues her lifestyle modifications. -Continue current medication regimen. No refills needed today.  3. Other depression, with emotional eating Behavior modification techniques were discussed today to help Rose Miller deal with her emotional/non-hunger eating behaviors.  Orders and follow up as documented in patient record.  -Refer to Dr. Mallie Mussel.  4. Class 3 severe obesity with serious comorbidity and body mass index (BMI) of 45.0 to 49.9 in adult, unspecified obesity type (HCC) Rose Miller is currently in the action stage of change. As such, her goal is to continue with weight loss efforts. She has agreed to the [employer].   Exercise goals: No exercise has been prescribed at this time.  Behavioral modification strategies: increasing lean protein intake, decreasing eating out, meal planning and cooking strategies and keeping healthy foods in the home.  Rose Miller has agreed to follow-up with our clinic in 3-4 weeks. She was informed of the importance of frequent follow-up visits to maximize her success with intensive lifestyle modifications for her multiple health conditions.   Objective:   Blood pressure 116/73, pulse 81, temperature 98 F (36.7 C), height 5\' 10"  (1.778 m), weight (!) 340 lb (154.2 kg), SpO2 98 %. Body mass index is 48.78 kg/m.  General: Cooperative, alert, well developed, in no acute distress. HEENT: Conjunctivae and lids unremarkable. Cardiovascular: Regular rhythm.  Lungs: Normal work of breathing. Neurologic: No focal deficits.   Lab Results  Component Value Date   CREATININE 1.38 (H) 06/06/2020   BUN 19 06/06/2020   NA 136 06/06/2020   K 3.3 (L) 06/06/2020   CL 101 06/06/2020   CO2 25 06/06/2020   Lab Results  Component Value Date   ALT 13 02/02/2020  AST 15 02/02/2020   ALKPHOS 68 02/02/2020   BILITOT 0.3 02/02/2020   Lab Results  Component Value Date   HGBA1C 5.9 (H) 04/03/2020   HGBA1C 6.7 (H) 11/17/2019   HGBA1C 6.2 02/17/2019   HGBA1C 6.7  (H) 10/02/2017   HGBA1C 6.7 (H) 03/31/2017   Lab Results  Component Value Date   INSULIN 12.0 02/02/2020   Lab Results  Component Value Date   TSH 1.140 02/02/2020   Lab Results  Component Value Date   CHOL 137 06/06/2020   HDL 51.60 06/06/2020   LDLCALC 61 06/06/2020   TRIG 123.0 06/06/2020   CHOLHDL 3 06/06/2020   Lab Results  Component Value Date   WBC 3.8 02/02/2020   HGB 11.4 02/02/2020   HCT 35.2 02/02/2020   MCV 85 02/02/2020   PLT 263 02/02/2020   No results found for: IRON, TIBC, FERRITIN  Obesity Behavioral Intervention:   Approximately 15 minutes were spent on the discussion below.  ASK: We discussed the diagnosis of obesity with Rose Miller today and Rose Miller agreed to give [place] permission to discuss obesity behavioral modification therapy today.  ASSESS: Rose Miller has the diagnosis of obesity and her BMI today is 48.9. Rose Miller is in the action stage of change.   ADVISE: Rose Miller was educated on the multiple health risks of obesity as well as the benefit of weight loss to improve her health. She was advised of the need for long term treatment and the importance of lifestyle modifications to improve her current health and to decrease her risk of future health problems.  AGREE: Multiple dietary modification options and treatment options were discussed and Rose Miller agreed to follow the recommendations documented in the above note.  ARRANGE: Rose Miller was educated on the importance of frequent visits to treat obesity as outlined per CMS and USPSTF guidelines and agreed to schedule her next follow up appointment today.  Attestation Statements:   Reviewed by clinician on day of visit: allergies, medications, problem list, medical history, surgical history, family history, social history, and previous encounter notes.  Coral Ceo, CMA, am acting as transcriptionist for Coralie Common, MD.   I have reviewed the above documentation for accuracy and completeness,  and I agree with the above. - Jinny Blossom, MD

## 2020-06-14 NOTE — Telephone Encounter (Signed)
Pt last seen by Dr. Ukleja.  

## 2020-06-15 ENCOUNTER — Telehealth: Payer: Self-pay | Admitting: Nurse Practitioner

## 2020-06-15 ENCOUNTER — Other Ambulatory Visit: Payer: Self-pay | Admitting: Nurse Practitioner

## 2020-06-15 DIAGNOSIS — E785 Hyperlipidemia, unspecified: Secondary | ICD-10-CM

## 2020-06-15 NOTE — Telephone Encounter (Signed)
Pt is wanting a cb concerning her most recent lab results. Please advise at [phone].

## 2020-06-16 ENCOUNTER — Other Ambulatory Visit: Payer: Self-pay

## 2020-06-16 DIAGNOSIS — E876 Hypokalemia: Secondary | ICD-10-CM

## 2020-06-16 DIAGNOSIS — N1832 Chronic kidney disease, stage 3b: Secondary | ICD-10-CM

## 2020-06-16 DIAGNOSIS — T502X5A Adverse effect of carbonic-anhydrase inhibitors, benzothiadiazides and other diuretics, initial encounter: Secondary | ICD-10-CM

## 2020-06-16 DIAGNOSIS — I10 Essential (primary) hypertension: Secondary | ICD-10-CM

## 2020-06-16 MED ORDER — LOSARTAN POTASSIUM 100 MG PO TABS: 100.0000 mg | ORAL_TABLET | Freq: Every day | ORAL | 3 refills | Status: DC

## 2020-06-16 NOTE — Telephone Encounter (Signed)
Patient notified and verbalized understanding. 

## 2020-06-16 NOTE — Progress Notes (Signed)
Ok to change pharmacy and resend medication

## 2020-06-20 ENCOUNTER — Telehealth (INDEPENDENT_AMBULATORY_CARE_PROVIDER_SITE_OTHER): Payer: Medicare Other | Admitting: Psychology

## 2020-06-20 ENCOUNTER — Encounter (INDEPENDENT_AMBULATORY_CARE_PROVIDER_SITE_OTHER): Payer: Self-pay

## 2020-06-20 ENCOUNTER — Telehealth (INDEPENDENT_AMBULATORY_CARE_PROVIDER_SITE_OTHER): Payer: Self-pay | Admitting: Psychology

## 2020-06-20 NOTE — Telephone Encounter (Signed)
  Office: [phone]  /  Fax: [phone]  Date of Call: June 20, 2020  Time of Call: 11:04am Provider: Glennie Isle, PsyD  CONTENT: This provider called Rose Miller to check-in as she did not present for today's MyChart Video Visit appointment at 11:00am. A HIPAA compliant voicemail could not be left as her mailbox was full. Of note, this provider stayed on the MyChart Video Visit appointment for 5 minutes prior to signing off per the clinic's grace period policy.    PLAN:  This provider will wait for [name] to call back. A MyChart message will be sent by front desk staff regarding today's missed appointment. No further follow-up planned by this provider.

## 2020-06-29 ENCOUNTER — Other Ambulatory Visit: Payer: Self-pay

## 2020-06-29 ENCOUNTER — Ambulatory Visit (INDEPENDENT_AMBULATORY_CARE_PROVIDER_SITE_OTHER): Payer: Medicare Other | Admitting: Adult Health

## 2020-06-29 ENCOUNTER — Encounter (INDEPENDENT_AMBULATORY_CARE_PROVIDER_SITE_OTHER): Payer: Self-pay | Admitting: Adult Health

## 2020-06-29 VITALS — BP 116/69 | HR 69 | Temp 98.2°F | Ht 70.0 in | Wt 341.0 lb

## 2020-06-29 DIAGNOSIS — I152 Hypertension secondary to endocrine disorders: Secondary | ICD-10-CM

## 2020-06-29 DIAGNOSIS — N1831 Chronic kidney disease, stage 3a: Secondary | ICD-10-CM

## 2020-06-29 DIAGNOSIS — E1165 Type 2 diabetes mellitus with hyperglycemia: Secondary | ICD-10-CM | POA: Diagnosis not present

## 2020-06-29 DIAGNOSIS — E1159 Type 2 diabetes mellitus with other circulatory complications: Secondary | ICD-10-CM | POA: Diagnosis not present

## 2020-06-29 DIAGNOSIS — Z6841 Body Mass Index (BMI) 40.0 and over, adult: Secondary | ICD-10-CM

## 2020-06-29 MED ORDER — OZEMPIC (0.25 OR 0.5 MG/DOSE) 2 MG/1.5ML ~~LOC~~ SOPN: 1.0000 mg | PEN_INJECTOR | SUBCUTANEOUS | 0 refills | Status: DC

## 2020-07-03 NOTE — Progress Notes (Signed)
Chief Complaint:   OBESITY Kyoko is here to discuss her progress with her obesity treatment plan along with follow-up of her obesity related diagnoses. Kennetta is on the [employer] and states she is following her eating plan approximately 60% of the time. Karstyn states she is not currently exercising.  Today's visit was #: 9 Starting weight: 336 lbs Starting date: 02/02/2020 Today's weight: 341 lbs Today's date: 06/29/2020 Total lbs lost to date: 0 Total lbs lost since last in-office visit: 0  Interim History: Jacqualyn started Ozempic on 02/16/2020, increased to 0.5 mg on 04/24/2020 and tolerating it well. She will occasionally experience polyphagia.  Subjective:   1. Type 2 diabetes mellitus with hyperglycemia, without long-term current use of insulin (HCC) Kloi's ambulatory fasting BG run 90-150. She denies episodes of hypoglycemia. Lucendia has been on Ozempic 0.5 mg since 04/24/2020 and tolerating it well.  Lab Results  Component Value Date   HGBA1C 5.9 (H) 04/03/2020   HGBA1C 6.7 (H) 11/17/2019   HGBA1C 6.2 02/17/2019   Lab Results  Component Value Date   MICROALBUR 1.1 02/17/2019   LDLCALC 61 06/06/2020   CREATININE 1.38 (H) 06/06/2020   Lab Results  Component Value Date   INSULIN 12.0 02/02/2020   2. Hypertension associated with diabetes [facility]) BP/HR are excellent at OV. Yilia is on Norvasc 10 mg QD, Cozaar 100 mg, and hydralazine 25 mg BID.  BP Readings from Last 3 Encounters:  06/29/20 116/69  06/09/20 138/82  06/07/20 116/73   3. Stage 3a chronic kidney disease ([place]) Shanyiah's PCP ran CMP on 06/06/2020. Her potassium is 3.3, creatinine 1.38, and GFR 39.91. PCP discontinued losartan/HCTZ and started plain losartan.  Assessment/Plan:   1. Type 2 diabetes mellitus with hyperglycemia, without long-term current use of insulin (HCC) Good blood sugar control is important to decrease the likelihood of diabetic complications such as nephropathy,  neuropathy, limb loss, blindness, coronary artery disease, and death. Intensive lifestyle modification including diet, exercise and weight loss are the first line of treatment for diabetes.  Increase Ozempic to 1 mg once a week, as prescribed below.  - Semaglutide,0.25 or 0.5MG /DOS, (OZEMPIC, 0.25 OR 0.5 MG/DOSE,) 2 MG/1.5ML SOPN; Inject 1 mg into the skin once a week.  Dispense: 3 mL; Refill: 0  2. Hypertension associated with diabetes ([place]) Seda is working on healthy weight loss and exercise to improve blood pressure control. We will watch for signs of hypotension as she continues her lifestyle modifications. Continue current anti-hypertensive regimen.  3. Stage 3a chronic kidney disease ([place]) Lab results and trends reviewed. We discussed several lifestyle modifications today and she will continue to work on diet, exercise and weight loss efforts. Avoid nephrotoxic medications. Orders and follow up as documented in patient record.  Avoid nephrotoxic substances.  Counseling Chronic kidney disease (CKD) happens when the kidneys are damaged over a long period of time. Most of the time, this condition does not go away, but it can usually be controlled. Steps must be taken to slow down the kidney damage or to stop it from getting worse. Intensive lifestyle modifications are the first line treatment for this issue.  Avoid buying foods that are: processed, frozen, or prepackaged to avoid excess salt.  4. Class 3 severe obesity with serious comorbidity and body mass index (BMI) of 45.0 to 49.9 in adult, unspecified obesity type (HCC)  Linnaea is currently in the action stage of change. As such, her goal is to continue with weight loss efforts. She has agreed  to the [employer].   Exercise goals: No exercise has been prescribed at this time.  Behavioral modification strategies: increasing lean protein intake, decreasing simple carbohydrates, meal planning and cooking strategies, keeping  healthy foods in the home, and planning for success.  Tanika has agreed to follow-up with our clinic in 3 weeks. She was informed of the importance of frequent follow-up visits to maximize her success with intensive lifestyle modifications for her multiple health conditions.   Objective:   Blood pressure 116/69, pulse 69, temperature 98.2 F (36.8 C), height 5\' 10"  (1.778 m), weight (!) 341 lb (154.7 kg), SpO2 97 %. Body mass index is 48.93 kg/m.  General: Cooperative, alert, well developed, in no acute distress. HEENT: Conjunctivae and lids unremarkable. Cardiovascular: Regular rhythm.  Lungs: Normal work of breathing. Neurologic: No focal deficits.   Lab Results  Component Value Date   CREATININE 1.38 (H) 06/06/2020   BUN 19 06/06/2020   NA 136 06/06/2020   K 3.3 (L) 06/06/2020   CL 101 06/06/2020   CO2 25 06/06/2020   Lab Results  Component Value Date   ALT 13 02/02/2020   AST 15 02/02/2020   ALKPHOS 68 02/02/2020   BILITOT 0.3 02/02/2020   Lab Results  Component Value Date   HGBA1C 5.9 (H) 04/03/2020   HGBA1C 6.7 (H) 11/17/2019   HGBA1C 6.2 02/17/2019   HGBA1C 6.7 (H) 10/02/2017   HGBA1C 6.7 (H) 03/31/2017   Lab Results  Component Value Date   INSULIN 12.0 02/02/2020   Lab Results  Component Value Date   TSH 1.140 02/02/2020   Lab Results  Component Value Date   CHOL 137 06/06/2020   HDL 51.60 06/06/2020   LDLCALC 61 06/06/2020   TRIG 123.0 06/06/2020   CHOLHDL 3 06/06/2020   Lab Results  Component Value Date   WBC 3.8 02/02/2020   HGB 11.4 02/02/2020   HCT 35.2 02/02/2020   MCV 85 02/02/2020   PLT 263 02/02/2020   No results found for: IRON, TIBC, FERRITIN  Obesity Behavioral Intervention:   Approximately 15 minutes were spent on the discussion below.  ASK: We discussed the diagnosis of obesity with [name] today and Gyneth agreed to give [place] permission to discuss obesity behavioral modification therapy today.  ASSESS: Loral has the  diagnosis of obesity and her BMI today is 49.0. Margit is in the action stage of change.   ADVISE: Wm was educated on the multiple health risks of obesity as well as the benefit of weight loss to improve her health. She was advised of the need for long term treatment and the importance of lifestyle modifications to improve her current health and to decrease her risk of future health problems.  AGREE: Multiple dietary modification options and treatment options were discussed and Magdaline agreed to follow the recommendations documented in the above note.  ARRANGE: Lannah was educated on the importance of frequent visits to treat obesity as outlined per CMS and USPSTF guidelines and agreed to schedule her next follow up appointment today.  Attestation Statements:   Reviewed by clinician on day of visit: allergies, medications, problem list, medical history, surgical history, family history, social history, and previous encounter notes.  Coral Ceo, CMA, am acting as transcriptionist for Mina Marble, NP.  I have reviewed the above documentation for accuracy and completeness, and I agree with the above. -  Willadeen Colantuono d. Hala Narula, NP-C

## 2020-07-05 ENCOUNTER — Other Ambulatory Visit (INDEPENDENT_AMBULATORY_CARE_PROVIDER_SITE_OTHER): Payer: Self-pay | Admitting: Adult Health

## 2020-07-05 MED ORDER — SEMAGLUTIDE (1 MG/DOSE) 4 MG/3ML ~~LOC~~ SOPN: 1.0000 mg | PEN_INJECTOR | SUBCUTANEOUS | 0 refills | Status: DC

## 2020-07-19 ENCOUNTER — Other Ambulatory Visit: Payer: Self-pay

## 2020-07-19 ENCOUNTER — Ambulatory Visit (INDEPENDENT_AMBULATORY_CARE_PROVIDER_SITE_OTHER): Payer: Medicare Other | Admitting: Physician Assistant

## 2020-07-19 ENCOUNTER — Encounter (INDEPENDENT_AMBULATORY_CARE_PROVIDER_SITE_OTHER): Payer: Self-pay | Admitting: Physician Assistant

## 2020-07-19 VITALS — BP 115/66 | HR 78 | Temp 98.3°F | Ht 70.0 in | Wt 339.0 lb

## 2020-07-19 DIAGNOSIS — E785 Hyperlipidemia, unspecified: Secondary | ICD-10-CM | POA: Diagnosis not present

## 2020-07-19 DIAGNOSIS — E1165 Type 2 diabetes mellitus with hyperglycemia: Secondary | ICD-10-CM | POA: Diagnosis not present

## 2020-07-19 DIAGNOSIS — Z6841 Body Mass Index (BMI) 40.0 and over, adult: Secondary | ICD-10-CM | POA: Diagnosis not present

## 2020-07-25 DIAGNOSIS — E1122 Type 2 diabetes mellitus with diabetic chronic kidney disease: Secondary | ICD-10-CM | POA: Diagnosis not present

## 2020-07-25 DIAGNOSIS — N1831 Chronic kidney disease, stage 3a: Secondary | ICD-10-CM | POA: Diagnosis not present

## 2020-07-25 DIAGNOSIS — I129 Hypertensive chronic kidney disease with stage 1 through stage 4 chronic kidney disease, or unspecified chronic kidney disease: Secondary | ICD-10-CM | POA: Diagnosis not present

## 2020-07-25 DIAGNOSIS — I350 Nonrheumatic aortic (valve) stenosis: Secondary | ICD-10-CM | POA: Diagnosis not present

## 2020-07-25 DIAGNOSIS — N1832 Chronic kidney disease, stage 3b: Secondary | ICD-10-CM | POA: Diagnosis not present

## 2020-07-25 DIAGNOSIS — N390 Urinary tract infection, site not specified: Secondary | ICD-10-CM | POA: Diagnosis not present

## 2020-07-25 NOTE — Progress Notes (Signed)
Chief Complaint:   OBESITY Rose Miller is here to discuss her progress with her obesity treatment plan along with follow-up of her obesity related diagnoses. Rose Miller is on the [employer] and states she is following her eating plan approximately 75% of the time. Rose Miller states she is doing 0 minutes 0 times per week.  Today's visit was #: 10 Starting weight: 336 lbs Starting date: 02/02/2020 Today's weight: 339 lbs Today's date: 07/19/2020 Total lbs lost to date: 0 Total lbs lost since last in-office visit: 2  Interim History: Rose Miller reports today that she wants to quit the programs, as it is not working for her. She is eating Honey Bunches of Oats for breakfast, eating bananas and she is not eating enough protein. She does not want to journal.  Subjective:   1. Hyperlipidemia LDL goal <70 Rose Miller is on atorvastatin, and she denies myalgias or chest pain.  2. Type 2 diabetes mellitus with hyperglycemia, without long-term current use of insulin ([place]) Rose Miller is not checking her BGs at home. She is not taking Ozempic as it costs too much. Her last A1c was 5.9.  Assessment/Plan:   1. Hyperlipidemia LDL goal <70 Cardiovascular risk and specific lipid/LDL goals reviewed.  We discussed several lifestyle modifications today. We will refill atorvastatin 10 mg q daily #30 for 1 month. Rose Miller will continue to work on diet, exercise and weight loss efforts. Orders and follow up as documented in patient record.   Counseling Intensive lifestyle modifications are the first line treatment for this issue. Dietary changes: Increase soluble fiber. Decrease simple carbohydrates. Exercise changes: Moderate to vigorous-intensity aerobic activity 150 minutes per week if tolerated. Lipid-lowering medications: see documented in medical record.  2. Type 2 diabetes mellitus with hyperglycemia, without long-term current use of insulin ([place]) Rose Miller will continue her meal plan. Good blood sugar  control is important to decrease the likelihood of diabetic complications such as nephropathy, neuropathy, limb loss, blindness, coronary artery disease, and death. Intensive lifestyle modification including diet, exercise and weight loss are the first line of treatment for diabetes.   3. Class 3 severe obesity with serious comorbidity and body mass index (BMI) of 45.0 to 49.9 in adult, unspecified obesity type (HCC) Rose Miller is currently in the action stage of change. As such, her goal is to continue with weight loss efforts. She has agreed to following a lower carbohydrate, vegetable and lean protein rich diet plan.   Exercise goals: No exercise has been prescribed at this time.  Behavioral modification strategies: decreasing simple carbohydrates, no skipping meals, and meal planning and cooking strategies.  Rose Miller has agreed to follow-up with our clinic in 2 weeks. She was informed of the importance of frequent follow-up visits to maximize her success with intensive lifestyle modifications for her multiple health conditions.   Objective:   Blood pressure 115/66, pulse 78, temperature 98.3 F (36.8 C), height 5\' 10"  (1.778 m), weight (!) 339 lb (153.8 kg), SpO2 98 %. Body mass index is 48.64 kg/m.  General: Cooperative, alert, well developed, in no acute distress. HEENT: Conjunctivae and lids unremarkable. Cardiovascular: Regular rhythm.  Lungs: Normal work of breathing. Neurologic: No focal deficits.   Lab Results  Component Value Date   CREATININE 1.38 (H) 06/06/2020   BUN 19 06/06/2020   NA 136 06/06/2020   K 3.3 (L) 06/06/2020   CL 101 06/06/2020   CO2 25 06/06/2020   Lab Results  Component Value Date   ALT 13 02/02/2020   AST 15 02/02/2020  ALKPHOS 68 02/02/2020   BILITOT 0.3 02/02/2020   Lab Results  Component Value Date   HGBA1C 5.9 (H) 04/03/2020   HGBA1C 6.7 (H) 11/17/2019   HGBA1C 6.2 02/17/2019   HGBA1C 6.7 (H) 10/02/2017   HGBA1C 6.7 (H) 03/31/2017   Lab  Results  Component Value Date   INSULIN 12.0 02/02/2020   Lab Results  Component Value Date   TSH 1.140 02/02/2020   Lab Results  Component Value Date   CHOL 137 06/06/2020   HDL 51.60 06/06/2020   LDLCALC 61 06/06/2020   TRIG 123.0 06/06/2020   CHOLHDL 3 06/06/2020   Lab Results  Component Value Date   VD25OH 54.1 02/02/2020   Lab Results  Component Value Date   WBC 3.8 02/02/2020   HGB 11.4 02/02/2020   HCT 35.2 02/02/2020   MCV 85 02/02/2020   PLT 263 02/02/2020   No results found for: IRON, TIBC, FERRITIN  Obesity Behavioral Intervention:   Approximately 15 minutes were spent on the discussion below.  ASK: We discussed the diagnosis of obesity with Rose Miller today and Rose Miller agreed to give [place] permission to discuss obesity behavioral modification therapy today.  ASSESS: Rose Miller has the diagnosis of obesity and her BMI today is 48.64. Rose Miller is in the action stage of change.   ADVISE: Rose Miller was educated on the multiple health risks of obesity as well as the benefit of weight loss to improve her health. She was advised of the need for long term treatment and the importance of lifestyle modifications to improve her current health and to decrease her risk of future health problems.  AGREE: Multiple dietary modification options and treatment options were discussed and Rose Miller agreed to follow the recommendations documented in the above note.  ARRANGE: Rose Miller was educated on the importance of frequent visits to treat obesity as outlined per CMS and USPSTF guidelines and agreed to schedule her next follow up appointment today.  Attestation Statements:   Reviewed by clinician on day of visit: allergies, medications, problem list, medical history, surgical history, family history, social history, and previous encounter notes.   Wilhemena Durie, am acting as transcriptionist for [employer], PA-C.  I have reviewed the above documentation for accuracy and  completeness, and I agree with the above. Abby Potash, PA-C

## 2020-07-26 ENCOUNTER — Encounter: Payer: Self-pay | Admitting: Nurse Practitioner

## 2020-07-26 ENCOUNTER — Encounter (INDEPENDENT_AMBULATORY_CARE_PROVIDER_SITE_OTHER): Payer: Self-pay | Admitting: Physician Assistant

## 2020-07-26 MED ORDER — ATORVASTATIN CALCIUM 10 MG PO TABS
10.0000 mg | ORAL_TABLET | Freq: Every day | ORAL | 0 refills | Status: DC
Start: 2020-07-26 — End: 2020-07-27

## 2020-07-27 ENCOUNTER — Encounter: Payer: Self-pay | Admitting: Sports Medicine

## 2020-07-27 ENCOUNTER — Other Ambulatory Visit (INDEPENDENT_AMBULATORY_CARE_PROVIDER_SITE_OTHER): Payer: Self-pay

## 2020-07-27 ENCOUNTER — Other Ambulatory Visit: Payer: Self-pay

## 2020-07-27 ENCOUNTER — Ambulatory Visit: Payer: Medicare Other | Admitting: Sports Medicine

## 2020-07-27 DIAGNOSIS — E119 Type 2 diabetes mellitus without complications: Secondary | ICD-10-CM

## 2020-07-27 DIAGNOSIS — M79675 Pain in left toe(s): Secondary | ICD-10-CM

## 2020-07-27 DIAGNOSIS — B351 Tinea unguium: Secondary | ICD-10-CM

## 2020-07-27 DIAGNOSIS — Z872 Personal history of diseases of the skin and subcutaneous tissue: Secondary | ICD-10-CM

## 2020-07-27 DIAGNOSIS — M79674 Pain in right toe(s): Secondary | ICD-10-CM | POA: Diagnosis not present

## 2020-07-27 DIAGNOSIS — E785 Hyperlipidemia, unspecified: Secondary | ICD-10-CM

## 2020-07-27 MED ORDER — ATORVASTATIN CALCIUM 10 MG PO TABS: 10.0000 mg | ORAL_TABLET | Freq: Every day | ORAL | 0 refills | Status: DC

## 2020-07-27 MED ORDER — NEOMYCIN-POLYMYXIN-HC 3.5-10000-1 OT SOLN
OTIC | 0 refills | Status: DC
Start: 2020-07-27 — End: 2022-03-14

## 2020-07-27 NOTE — Addendum Note (Signed)
Addended by: Abby Potash on: 07/27/2020 08:04 AM   Modules accepted: Orders

## 2020-07-27 NOTE — Telephone Encounter (Signed)
Pt last seen by Tracey Aguilar, PA-C.  

## 2020-07-27 NOTE — Progress Notes (Signed)
Subjective: Rose Miller is a 65 y.o. female patient with history of diabetes who presents to office today complaining of long,mildly painful nails  while ambulating in shoes; unable to trim. Patient states that she tends to get soreness at her big toenails both corners because the nails curving and states that her pedicurist has a hard time getting them out comfortably.  Patient did not record her blood sugar and states that she is now considered diet control and is no longer on medication and her last A1c was around 5 something.  Patient reports that she was diagnosed with diabetes in 2020 after being considered prediabetic for a very long time.  No other pedal complaints noted.  Patient is also assisted by sister this visit.  Patient Active Problem List   Diagnosis Date Noted   Hypertension associated with diabetes ([place]) 04/24/2020   Postmenopausal estrogen deficiency 02/18/2019   Diuretic-induced hypokalemia 02/18/2019   Former smoker 05/13/2018   Long-term use of aspirin therapy 05/13/2018   Class 3 severe obesity with serious comorbidity and body mass index (BMI) of 45.0 to 49.9 in adult [facility]) 03/31/2017   DM (diabetes mellitus) ([place]) 03/31/2017   CKD (chronic kidney disease) stage 3, GFR 30-59 ml/min (HCC) 03/31/2017   Hypercholesterolemia 06/28/2016   Aortic valve stenosis 41/74/0814   Periumbilical pain 48/18/5631   Adhesive capsulitis of right shoulder 09/29/2014   IT band syndrome 09/11/2014   Right shoulder pain 09/11/2014   Ventral hernia 05/01/2014   Essential hypertension 03/17/2014   Current Outpatient Medications on File Prior to Visit  Medication Sig Dispense Refill   amLODipine (NORVASC) 10 MG tablet TAKE 1 TABLET BY MOUTH  DAILY 90 tablet 3   atorvastatin (LIPITOR) 10 MG tablet Take 1 tablet (10 mg total) by mouth daily. 90 tablet 0   Bayer Microlet Lancets lancets Use when testing glucose as recommended.     Blood Pressure Monitoring (SPHYGMOMANOMETER) MISC 1 Units  by Does not apply route daily. 1 each 0   Cholecalciferol 25 MCG (1000 UT) tablet Take 1,000 Units by mouth daily.     Coenzyme Q10 (CO Q-10) 100 MG CAPS Take 100 mg by mouth daily.      Cyanocobalamin (B-12) 2500 MCG TABS Take 1 tablet by mouth daily.      glucose blood (CONTOUR NEXT TEST) test strip Test up to 4 times per day as recommended     hydrALAZINE (APRESOLINE) 25 MG tablet TAKE 1 TABLET BY MOUTH  TWICE DAILY 180 tablet 3   Insulin Pen Needle (BD PEN NEEDLE NANO 2ND GEN) 32G X 4 MM MISC 1 Package by Does not apply route 2 (two) times daily. 100 each 0   losartan (COZAAR) 100 MG tablet Take 1 tablet (100 mg total) by mouth daily. 90 tablet 3   vitamin C (ASCORBIC ACID) 500 MG tablet Take 500 mg by mouth daily.      Zinc 50 MG CAPS Take by mouth.     No current facility-administered medications on file prior to visit.   No Known Allergies  Recent Results (from the past 2160 hour(s))  ECHOCARDIOGRAM COMPLETE     Status: None   Collection Time: 05/03/20 12:06 PM  Result Value Ref Range   Area-P 1/2 3.42 cm2   S' Lateral 2.60 cm   AV Area mean vel 1.19 cm2   AR max vel 1.21 cm2   AV Area VTI 1.32 cm2   P 1/2 time 315 msec   Ao pk vel 3.41 m/s  AV Mean grad 26.7 mmHg   AV Peak grad 46.6 mmHg  Basic metabolic panel     Status: Abnormal   Collection Time: 06/06/20  9:51 AM  Result Value Ref Range   Sodium 136 135 - 145 mEq/L   Potassium 3.3 (L) 3.5 - 5.1 mEq/L   Chloride 101 96 - 112 mEq/L   CO2 25 19 - 32 mEq/L   Glucose, Bld 121 (H) 70 - 99 mg/dL   BUN 19 6 - 23 mg/dL   Creatinine, Ser 1.38 (H) 0.40 - 1.20 mg/dL   GFR 39.91 (L) >60.00 mL/min    Comment: Calculated using the CKD-EPI Creatinine Equation (2021)   Calcium 9.1 8.4 - 10.5 mg/dL  Lipid panel     Status: None   Collection Time: 06/06/20  9:51 AM  Result Value Ref Range   Cholesterol 137 0 - 200 mg/dL    Comment: ATP III Classification       Desirable:  < 200 mg/dL               Borderline High:  200 - 239  mg/dL          High:  > = 240 mg/dL   Triglycerides 123.0 0.0 - 149.0 mg/dL    Comment: Normal:  <150 mg/dLBorderline High:  150 - 199 mg/dL   HDL 51.60 >39.00 mg/dL   VLDL 24.6 0.0 - 40.0 mg/dL   LDL Cholesterol 61 0 - 99 mg/dL   Total CHOL/HDL Ratio 3     Comment:                Men          Women1/2 Average Risk     3.4          3.3Average Risk          5.0          4.42X Average Risk          9.6          7.13X Average Risk          15.0          11.0                       NonHDL 85.33     Comment: NOTE:  Non-HDL goal should be 30 mg/dL higher than patient's LDL goal (i.e. LDL goal of < 70 mg/dL, would have non-HDL goal of < 100 mg/dL)    Objective: General: Patient is awake, alert, and oriented x 3 and in no acute distress.  Integument: Skin is warm, dry and supple bilateral. Nails are tender, long, thickened and dystrophic with subungual debris, consistent with onychomycosis, 1-5 bilateral.  Chronic incurvation of bilateral hallux nails with no significant redness swelling warmth drainage.  No signs of infection. No open lesions or preulcerative lesions present bilateral. Remaining integument unremarkable.  Vasculature:  Dorsalis Pedis pulse 1/4 bilateral. Posterior Tibial pulse 1/4 bilateral.  Capillary fill time <3 sec 1-5 bilateral. Positive hair growth to the level of the digits. Temperature gradient within normal limits. No varicosities present bilateral. No edema present bilateral.   Neurology: The patient has intact sensation measured with a 5.07/10g Semmes Weinstein Monofilament at all pedal sites bilateral . Vibratory sensation intact bilateral with tuning fork. No Babinski sign present bilateral.   Musculoskeletal: Asymptomatic hammertoe and pes planus pedal deformities noted bilateral. Muscular strength 5/5 in all lower extremity muscular groups bilateral without pain  on range of motion . No tenderness with calf compression bilateral.  Assessment and Plan: Problem List  Items Addressed This Visit   None Visit Diagnoses     Pain due to onychomycosis of toenails of both feet    -  Primary   History of ingrowing nail       Diabetes mellitus without complication ([place])           -Examined patient. -Discussed and educated patient on diabetic foot care, especially with  regards to the vascular, neurological and musculoskeletal systems.  -Stressed the importance of good glycemic control and the detriment of not  controlling glucose levels in relation to the foot. -Mechanically debrided all nails 1-5 bilateral using sterile nail nipper and filed with dremel without incident and removed offending corners of bilateral hallux nail borders to patient's tolerance without incident.  For preventative measures prescribed Corticosporin solution for patient to use if needed if there is any soreness at bilateral corners. -Answered all patient questions -Patient to return  in 3 months for at risk foot care and advised patient at this time she does not need any ingrown nail removal procedure we will see how the treatments help -Patient advised to call the office if any problems or questions arise in the meantime.  Landis Martins, DPM

## 2020-08-02 ENCOUNTER — Ambulatory Visit (INDEPENDENT_AMBULATORY_CARE_PROVIDER_SITE_OTHER): Payer: Medicare Other | Admitting: Physician Assistant

## 2020-08-02 ENCOUNTER — Encounter (INDEPENDENT_AMBULATORY_CARE_PROVIDER_SITE_OTHER): Payer: Self-pay | Admitting: Physician Assistant

## 2020-08-02 ENCOUNTER — Other Ambulatory Visit: Payer: Self-pay

## 2020-08-02 VITALS — BP 144/57 | HR 65 | Temp 98.5°F | Ht 70.0 in | Wt 343.0 lb

## 2020-08-02 DIAGNOSIS — Z6841 Body Mass Index (BMI) 40.0 and over, adult: Secondary | ICD-10-CM

## 2020-08-02 DIAGNOSIS — I152 Hypertension secondary to endocrine disorders: Secondary | ICD-10-CM

## 2020-08-02 DIAGNOSIS — E1159 Type 2 diabetes mellitus with other circulatory complications: Secondary | ICD-10-CM | POA: Diagnosis not present

## 2020-08-09 NOTE — Progress Notes (Signed)
Chief Complaint:   OBESITY Rose Miller is here to discuss her progress with her obesity treatment plan along with follow-up of her obesity related diagnoses. Rose Miller is on following a lower carbohydrate, vegetable and lean protein rich diet plan and states she is following her eating plan approximately 50% of the time. Rose Miller states she is doing 0 minutes 0 times per week.  Today's visit was #: 11 Starting weight: 336 lbs Starting date: 02/02/2020 Today's weight: 343 lbs Today's date: 08/02/2020 Total lbs lost to date: 0 Total lbs lost since last in-office visit: 0  Interim History: Rose Miller did not follow the lower carbohydrate plan due to having food that she did not want to waste. She is ready to go to the grocery store and start the lower carbohydrate plan.  Subjective:   1. Hypertension associated with diabetes ([place]) Rose Miller has not taken her blood pressure medications yet today. She denies chest pain or headache. Her primary care provider in managing her hypertension.  Assessment/Plan:   1. Hypertension associated with diabetes ([place]) Rose Miller will continue her medications, and will continue working on healthy weight loss and exercise to improve blood pressure control. We will monitor her blood pressure at each visit as she continues her lifestyle modifications. She will continue to follow up with her primary care provider.  2. Class 3 severe obesity with serious comorbidity and body mass index (BMI) of 45.0 to 49.9 in adult, unspecified obesity type ([place]), current bmi 49.22 Rose Miller is currently in the action stage of change. As such, her goal is to continue with weight loss efforts. She has agreed to following a lower carbohydrate, vegetable and lean protein rich diet plan.   Exercise goals: No exercise has been prescribed at this time.  Behavioral modification strategies: meal planning and cooking strategies and planning for success.  Rose Miller has agreed to follow-up with our  clinic in 3 weeks. She was informed of the importance of frequent follow-up visits to maximize her success with intensive lifestyle modifications for her multiple health conditions.   Objective:   Blood pressure (!) 144/57, pulse 65, temperature 98.5 F (36.9 C), height '5\' 10"'$  (1.778 m), weight (!) 343 lb (155.6 kg), SpO2 98 %. Body mass index is 49.22 kg/m.  General: Cooperative, alert, well developed, in no acute distress. HEENT: Conjunctivae and lids unremarkable. Cardiovascular: Regular rhythm.  Lungs: Normal work of breathing. Neurologic: No focal deficits.   Lab Results  Component Value Date   CREATININE 1.38 (H) 06/06/2020   BUN 19 06/06/2020   NA 136 06/06/2020   K 3.3 (L) 06/06/2020   CL 101 06/06/2020   CO2 25 06/06/2020   Lab Results  Component Value Date   ALT 13 02/02/2020   AST 15 02/02/2020   ALKPHOS 68 02/02/2020   BILITOT 0.3 02/02/2020   Lab Results  Component Value Date   HGBA1C 5.9 (H) 04/03/2020   HGBA1C 6.7 (H) 11/17/2019   HGBA1C 6.2 02/17/2019   HGBA1C 6.7 (H) 10/02/2017   HGBA1C 6.7 (H) 03/31/2017   Lab Results  Component Value Date   INSULIN 12.0 02/02/2020   Lab Results  Component Value Date   TSH 1.140 02/02/2020   Lab Results  Component Value Date   CHOL 137 06/06/2020   HDL 51.60 06/06/2020   LDLCALC 61 06/06/2020   TRIG 123.0 06/06/2020   CHOLHDL 3 06/06/2020   Lab Results  Component Value Date   VD25OH 54.1 02/02/2020   Lab Results  Component Value Date  WBC 3.8 02/02/2020   HGB 11.4 02/02/2020   HCT 35.2 02/02/2020   MCV 85 02/02/2020   PLT 263 02/02/2020   No results found for: IRON, TIBC, FERRITIN  Obesity Behavioral Intervention:   Approximately 15 minutes were spent on the discussion below.  ASK: We discussed the diagnosis of obesity with Rose Miller today and Rose Miller agreed to give [place] permission to discuss obesity behavioral modification therapy today.  ASSESS: Rose Miller has the diagnosis of obesity and her  BMI today is 49.22. Rose Miller is in the action stage of change.   ADVISE: Rose Miller was educated on the multiple health risks of obesity as well as the benefit of weight loss to improve her health. She was advised of the need for long term treatment and the importance of lifestyle modifications to improve her current health and to decrease her risk of future health problems.  AGREE: Multiple dietary modification options and treatment options were discussed and Rose Miller agreed to follow the recommendations documented in the above note.  ARRANGE: Rose Miller was educated on the importance of frequent visits to treat obesity as outlined per CMS and USPSTF guidelines and agreed to schedule her next follow up appointment today.  Attestation Statements:   Reviewed by clinician on day of visit: allergies, medications, problem list, medical history, surgical history, family history, social history, and previous encounter notes.   Wilhemena Durie, am acting as transcriptionist for [employer], PA-C.  I have reviewed the above documentation for accuracy and completeness, and I agree with the above. Abby Potash, PA-C

## 2020-08-21 ENCOUNTER — Encounter (INDEPENDENT_AMBULATORY_CARE_PROVIDER_SITE_OTHER): Payer: Self-pay

## 2020-08-23 ENCOUNTER — Ambulatory Visit (INDEPENDENT_AMBULATORY_CARE_PROVIDER_SITE_OTHER): Payer: Medicare Other | Admitting: Physician Assistant

## 2020-09-20 ENCOUNTER — Encounter: Payer: Self-pay | Admitting: Physician Assistant

## 2020-10-12 LAB — HM DIABETES EYE EXAM

## 2020-10-17 ENCOUNTER — Other Ambulatory Visit (INDEPENDENT_AMBULATORY_CARE_PROVIDER_SITE_OTHER): Payer: Self-pay | Admitting: Physician Assistant

## 2020-10-17 ENCOUNTER — Encounter: Payer: Self-pay | Admitting: Nurse Practitioner

## 2020-10-17 DIAGNOSIS — E785 Hyperlipidemia, unspecified: Secondary | ICD-10-CM

## 2020-10-18 ENCOUNTER — Encounter: Payer: Self-pay | Admitting: Physician Assistant

## 2020-10-18 ENCOUNTER — Ambulatory Visit: Payer: Medicare Other | Admitting: Physician Assistant

## 2020-10-18 VITALS — BP 160/90 | HR 84 | Ht 70.0 in | Wt 355.0 lb

## 2020-10-18 DIAGNOSIS — I350 Nonrheumatic aortic (valve) stenosis: Secondary | ICD-10-CM

## 2020-10-18 DIAGNOSIS — Z800 Family history of malignant neoplasm of digestive organs: Secondary | ICD-10-CM

## 2020-10-18 NOTE — Patient Instructions (Signed)
Follow up in 3 months to discuss colonoscopy.   If you are age 68 or older, your body mass index should be between 23-30. Your Body mass index is 50.94 kg/m. If this is out of the aforementioned range listed, please consider follow up with your Primary Care Provider.  If you are age 9 or younger, your body mass index should be between 19-25. Your Body mass index is 50.94 kg/m. If this is out of the aformentioned range listed, please consider follow up with your Primary Care Provider.   __________________________________________________________  The [facility] providers would like to encourage you to use [facility] to communicate with providers for non-urgent requests or questions.  Due to long hold times on the telephone, sending your provider a message by [facility] may be a faster and more efficient way to get a response.  Please allow 48 business hours for a response.  Please remember that this is for non-urgent requests.

## 2020-10-18 NOTE — Telephone Encounter (Signed)
Last OV with Tracey 

## 2020-10-18 NOTE — Progress Notes (Signed)
Chief Complaint: Family history of colon cancer  HPI:    Rose Miller is a 65 year old African-American female with a past medical history of aortic valve stenosis, CKD stage III, diabetes and multiple others listed below, known to Dr. Carlean Purl, who presents clinic today to discuss recall colonoscopy given family history of colon cancer.    06/07/2014 colonoscopy with severe diverticulosis in the left colon otherwise normal.  Repeat recommended in 5 years given father's history of colon cancer.  (There was some discussion about this as there were unsure when he was diagnosed.  Discussed that if it was after his early 21s and would not repeat until 2026.    10/2018 echo showed transaortic valve peak velocity had increased from 3.38-3.71.    05/03/2020 echo with an EF 60-65%.  Moderate LVH.  Moderate aortic stenosis.  Aortic valve area greater than 1 cm.    06/06/2020 BMP with a potassium low at 3.3.  Creatinine 1.38 .    06/09/2020 patient seen by cardiology in regards to aortic valve insufficiency.  At that time deemed stable with no symptoms.  She was told she was low risk for complications with a colonoscopy.    Today, the patient tells me that there is no way for her to figure out when her father had colon cancer.  She just cannot be certain if it was before 60 or after 60.  Does explain that she is getting some phlegm in her throat and she is not sure what this is related to.  Denies any cough or shortness of breath.  Also describes very occasional constipation for which she uses MiraLAX.  Tells me in general that she would like to wait for this colonoscopy given that she has follow-up with cardiology in November.    Denies fever, chills, weight loss, blood in her stool, change in bowel habits or symptoms that awaken her from sleep.  Past Medical History:  Diagnosis Date   Adhesive capsulitis    Allergy    Aortic valve stenosis    Arthritis    Asthma    Back pain    Bilateral swelling of feet     CKD (chronic kidney disease) stage 3, GFR 30-59 ml/min (HCC)    Constipation    Diabetes mellitus without complication (HCC)    Diuretic-induced hypokalemia    Heart murmur    Heartburn    Hypercholesterolemia    Hypertension    IT band syndrome    Joint pain    Obesity    Palpitations    Periumbilical pain    Postmenopausal estrogen deficiency    Prediabetes    Shoulder pain, right    Swallowing difficulty    Ventral hernia     Past Surgical History:  Procedure Laterality Date   CESAREAN SECTION  1989   COLONOSCOPY     TONSILLECTOMY AND ADENOIDECTOMY  1960    Current Outpatient Medications  Medication Sig Dispense Refill   amLODipine (NORVASC) 10 MG tablet TAKE 1 TABLET BY MOUTH  DAILY 90 tablet 3   atorvastatin (LIPITOR) 10 MG tablet Take 1 tablet (10 mg total) by mouth daily. 90 tablet 0   Bayer Microlet Lancets lancets Use when testing glucose as recommended.     Blood Pressure Monitoring (SPHYGMOMANOMETER) MISC 1 Units by Does not apply route daily. 1 each 0   Cholecalciferol 25 MCG (1000 UT) tablet Take 1,000 Units by mouth daily.     Coenzyme Q10 (CO Q-10) 100 MG CAPS Take  100 mg by mouth daily.      Cyanocobalamin (B-12) 2500 MCG TABS Take 1 tablet by mouth daily.      glucose blood (CONTOUR NEXT TEST) test strip Test up to 4 times per day as recommended     hydrALAZINE (APRESOLINE) 25 MG tablet TAKE 1 TABLET BY MOUTH  TWICE DAILY 180 tablet 3   Insulin Pen Needle (BD PEN NEEDLE NANO 2ND GEN) 32G X 4 MM MISC 1 Package by Does not apply route 2 (two) times daily. 100 each 0   losartan (COZAAR) 100 MG tablet Take 1 tablet (100 mg total) by mouth daily. 90 tablet 3   neomycin-polymyxin-hydrocortisone (CORTISPORIN) OTIC solution Apply 1-2 drops to toenail if there is soreness once daily after shower 10 mL 0   vitamin C (ASCORBIC ACID) 500 MG tablet Take 500 mg by mouth daily.      Zinc 50 MG CAPS Take by mouth.     No current facility-administered medications for  this visit.    Allergies as of 10/18/2020   (No Known Allergies)    Family History  Problem Relation Age of Onset   Arthritis Mother    Cancer Mother        pancreatic   Hypertension Mother    Obesity Mother    Cancer Father        prostate and colon   Colon cancer Father    Heart disease Father    Thyroid disease Father     Social History   Socioeconomic History   Marital status: Single    Spouse name: Not on file   Number of children: Not on file   Years of education: Not on file   Highest education level: Not on file  Occupational History   Occupation: retired  Tobacco Use   Smoking status: Former   Smokeless tobacco: Never  Scientific laboratory technician Use: Never used  Substance and Sexual Activity   Alcohol use: No    Alcohol/week: 0.0 standard drinks   Drug use: No   Sexual activity: Not on file  Other Topics Concern   Not on file  Social History Narrative   Not on file   Social Determinants of Health   Financial Resource Strain: Medium Risk   Difficulty of Paying Living Expenses: Somewhat hard  Food Insecurity: No Food Insecurity   Worried About Charity fundraiser in the Last Year: Never true   [place] in the Last Year: Never true  Transportation Needs: No Transportation Needs   Lack of Transportation (Medical): No   Lack of Transportation (Non-Medical): No  Physical Activity: Inactive   Days of Exercise per Week: 0 days   Minutes of Exercise per Session: 0 min  Stress: No Stress Concern Present   Feeling of Stress : Not at all  Social Connections: Socially Isolated   Frequency of Communication with Friends and Family: More than three times a week   Frequency of Social Gatherings with Friends and Family: Once a week   Attends Religious Services: Never   Marine scientist or Organizations: No   Attends Music therapist: Never   Marital Status: Never married  Human resources officer Violence: Not At Risk   Fear of Current or  Ex-Partner: No   Emotionally Abused: No   Physically Abused: No   Sexually Abused: No    Review of Systems:    Constitutional: No weight loss, fever or chills Skin: No rash  Cardiovascular: No chest pain Respiratory: No SOB or cough Gastrointestinal: See HPI and otherwise negative Genitourinary: No dysuria or change in urinary frequency Neurological: No headache, dizziness or syncope Musculoskeletal: No new muscle or joint pain Hematologic: No bleeding or bruising Psychiatric: No history of depression or anxiety   Physical Exam:  Vital signs: BP (!) 160/90   Pulse 84   Ht 5\' 10"  (1.778 m)   Wt (!) 355 lb (161 kg)   BMI 50.94 kg/m   Constitutional:   Pleasant morbidly obese African-American female appears to be in NAD, Well developed, Well nourished, alert and cooperative Head:  Normocephalic and atraumatic. Eyes:   PEERL, EOMI. No icterus. Conjunctiva pink. Ears:  Normal auditory acuity. Neck:  Supple Throat: Oral cavity and pharynx without inflammation, swelling or lesion.  Respiratory: Respirations even and unlabored. Lungs clear to auscultation bilaterally.   No wheezes, crackles, or rhonchi.  Cardiovascular: Normal S1, S2. No MRG. Regular rate and rhythm. No peripheral edema, cyanosis or pallor.  Gastrointestinal:  Soft, nondistended, mild epigastric TTP no rebound or guarding. Normal bowel sounds. No appreciable masses or hepatomegaly. Rectal:  Not performed.  Msk:  Symmetrical without gross deformities. Without edema, no deformity or joint abnormality.  Neurologic:  Alert and  oriented x4;  grossly normal neurologically.  Skin:   Dry and intact without significant lesions or rashes. Psychiatric: Demonstrates good judgement and reason without abnormal affect or behaviors.  RELEVANT LABS AND IMAGING: CBC    Component Value Date/Time   WBC 3.8 02/02/2020 1138   WBC 5.2 02/17/2019 0902   RBC 4.12 02/02/2020 1138   RBC 3.91 02/17/2019 0902   HGB 11.4 02/02/2020 1138    HCT 35.2 02/02/2020 1138   PLT 263 02/02/2020 1138   MCV 85 02/02/2020 1138   MCH 27.7 02/02/2020 1138   MCHC 32.4 02/02/2020 1138   MCHC 33.7 02/17/2019 0902   RDW 13.8 02/02/2020 1138   LYMPHSABS 1.9 02/02/2020 1138   EOSABS 0.1 02/02/2020 1138   BASOSABS 0.0 02/02/2020 1138    CMP     Component Value Date/Time   NA 136 06/06/2020 0951   NA 144 02/02/2020 1138   K 3.3 (L) 06/06/2020 0951   CL 101 06/06/2020 0951   CO2 25 06/06/2020 0951   GLUCOSE 121 (H) 06/06/2020 0951   BUN 19 06/06/2020 0951   BUN 11 02/02/2020 1138   CREATININE 1.38 (H) 06/06/2020 0951   CALCIUM 9.1 06/06/2020 0951   PROT 7.4 02/02/2020 1138   ALBUMIN 4.2 02/02/2020 1138   AST 15 02/02/2020 1138   ALT 13 02/02/2020 1138   ALKPHOS 68 02/02/2020 1138   BILITOT 0.3 02/02/2020 1138   GFRNONAA 52 (L) 02/02/2020 1138   GFRAA 60 02/02/2020 1138    Assessment: 1.  Family history of colon cancer: In her father, no way for her to find out when he was diagnosed 2.  Moderate aortic stenosis: Reviewed last echo, recently saw cardiology who believes she is stable and put her as low risk for colonoscopy 3.  Phlegm: Happens in her throat and comes up maybe once every week or so over the past few weeks, no help from cough medicine which she took empirically  Plan: 1.  Discussed colonoscopy with the patient.  Since we have no way of knowing when her father was diagnosed would recommend that she proceed in 5-year intervals.  She would like to wait to have this procedure done though until she follows with cardiology.  It looks like  she has appointment set up with him for repeat echo in the beginning of November.  Also discussed that her BMI is over 50 which means we would have to do her procedure at the hospital.  If she can spend the next 2 to 3 months trying to lose a little bit of weight and her echo looks okay then we may be able to do her upstairs in the [place].  Patient verbalized understanding. 2.  Scheduled  patient for follow-up visit in 3 months.  At that time we can rediscuss colonoscopy and where this should take place.  Ellouise Newer, PA-C [facility] 10/18/2020, 10:18 AM  Cc: Flossie Buffy, NP

## 2020-10-18 NOTE — Telephone Encounter (Signed)
She needs an appt. before we can fill this. Thanks!

## 2020-10-19 ENCOUNTER — Other Ambulatory Visit: Payer: Self-pay | Admitting: Nurse Practitioner

## 2020-10-19 DIAGNOSIS — Z1231 Encounter for screening mammogram for malignant neoplasm of breast: Secondary | ICD-10-CM

## 2020-11-02 ENCOUNTER — Other Ambulatory Visit: Payer: Self-pay

## 2020-11-02 ENCOUNTER — Ambulatory Visit: Payer: Medicare Other | Admitting: Sports Medicine

## 2020-11-02 ENCOUNTER — Encounter: Payer: Self-pay | Admitting: Sports Medicine

## 2020-11-02 DIAGNOSIS — M79675 Pain in left toe(s): Secondary | ICD-10-CM

## 2020-11-02 DIAGNOSIS — E119 Type 2 diabetes mellitus without complications: Secondary | ICD-10-CM

## 2020-11-02 DIAGNOSIS — Z872 Personal history of diseases of the skin and subcutaneous tissue: Secondary | ICD-10-CM

## 2020-11-02 DIAGNOSIS — B351 Tinea unguium: Secondary | ICD-10-CM | POA: Diagnosis not present

## 2020-11-02 DIAGNOSIS — M79674 Pain in right toe(s): Secondary | ICD-10-CM

## 2020-11-02 NOTE — Progress Notes (Signed)
Subjective: Rose Miller is a 65 y.o. female patient with history of diabetes who returns to office today for long painful nails.  Patient unable to trim.  Patient reports that last trims seem to help and solution to her nails gave her some relief.  States that the medication was expensive so she does not use it very often.  No other pedal complaints noted.  Patient Active Problem List   Diagnosis Date Noted   Hypertension associated with diabetes ([place]) 04/24/2020   Postmenopausal estrogen deficiency 02/18/2019   Diuretic-induced hypokalemia 02/18/2019   Former smoker 05/13/2018   Long-term use of aspirin therapy 05/13/2018   Class 3 severe obesity with serious comorbidity and body mass index (BMI) of 45.0 to 49.9 in adult [facility]) 03/31/2017   DM (diabetes mellitus) ([place]) 03/31/2017   CKD (chronic kidney disease) stage 3, GFR 30-59 ml/min (HCC) 03/31/2017   Hypercholesterolemia 06/28/2016   Aortic valve stenosis 75/17/0017   Periumbilical pain 49/44/9675   Adhesive capsulitis of right shoulder 09/29/2014   IT band syndrome 09/11/2014   Right shoulder pain 09/11/2014   Ventral hernia 05/01/2014   Essential hypertension 03/17/2014   Current Outpatient Medications on File Prior to Visit  Medication Sig Dispense Refill   amLODipine (NORVASC) 10 MG tablet TAKE 1 TABLET BY MOUTH  DAILY 90 tablet 3   atorvastatin (LIPITOR) 10 MG tablet Take 1 tablet (10 mg total) by mouth daily. 90 tablet 0   Bayer Microlet Lancets lancets Use when testing glucose as recommended.     Blood Pressure Monitoring (SPHYGMOMANOMETER) MISC 1 Units by Does not apply route daily. 1 each 0   Cholecalciferol 25 MCG (1000 UT) tablet Take 1,000 Units by mouth daily.     Coenzyme Q10 (CO Q-10) 100 MG CAPS Take 100 mg by mouth daily.      Cyanocobalamin (B-12) 2500 MCG TABS Take 1 tablet by mouth daily.      glucose blood (CONTOUR NEXT TEST) test strip Test up to 4 times per day as recommended     hydrALAZINE (APRESOLINE)  25 MG tablet TAKE 1 TABLET BY MOUTH  TWICE DAILY 180 tablet 3   Insulin Pen Needle (BD PEN NEEDLE NANO 2ND GEN) 32G X 4 MM MISC 1 Package by Does not apply route 2 (two) times daily. 100 each 0   losartan (COZAAR) 100 MG tablet Take 1 tablet (100 mg total) by mouth daily. 90 tablet 3   neomycin-polymyxin-hydrocortisone (CORTISPORIN) OTIC solution Apply 1-2 drops to toenail if there is soreness once daily after shower 10 mL 0   vitamin C (ASCORBIC ACID) 500 MG tablet Take 500 mg by mouth daily.      Zinc 50 MG CAPS Take by mouth.     No current facility-administered medications on file prior to visit.   No Known Allergies  Recent Results (from the past 2160 hour(s))  HM DIABETES EYE EXAM     Status: None   Collection Time: 10/12/20 12:00 AM  Result Value Ref Range   HM Diabetic Eye Exam No Retinopathy No Retinopathy    Comment: [facility]    Objective: General: Patient is awake, alert, and oriented x 3 and in no acute distress.  Integument: Skin is warm, dry and supple bilateral. Nails are tender, long, thickened and dystrophic with subungual debris, consistent with onychomycosis, 1-5 bilateral.  Chronic incurvation of bilateral hallux nails with no acute signs of infection.  No open lesions or preulcerative lesions present bilateral. Remaining integument unremarkable.  Vasculature:  Dorsalis Pedis pulse 1/4 bilateral. Posterior Tibial pulse 1/4 bilateral. Capillary fill time <3 sec 1-5 bilateral. Positive hair growth to the level of the digits.Temperature gradient within normal limits. No varicosities present bilateral. No edema present bilateral.   Neurology: Gross sensation present via light touch on today's exam.  Musculoskeletal: Asymptomatic hammertoe and pes planus pedal deformities noted bilateral. Muscular strength 5/5 in all lower extremity muscular groups bilateral without pain on range of motion, unchanged from prior.  No tenderness with calf compression  bilateral.  Assessment and Plan: Problem List Items Addressed This Visit   None Visit Diagnoses     Pain due to onychomycosis of toenails of both feet    -  Primary   History of ingrowing nail       Diabetes mellitus without complication ([place])           -Examined patient. -Re-Discussed and educated patient on diabetic foot care, especially with  regards to the vascular, neurological and musculoskeletal systems.  -Mechanically debrided all nails 1-5 bilateral using sterile nail nipper and filed with dremel without incident.  Advised patient to use Corticosporin solution as needed if soreness at bilateral hallux nail corners. -Answered all patient questions -Patient to return  in 3 3 months for diabetic nail care or sooner if problems or issues arise.  Landis Martins, DPM

## 2020-11-10 ENCOUNTER — Other Ambulatory Visit: Payer: Self-pay | Admitting: *Deleted

## 2020-11-10 DIAGNOSIS — E785 Hyperlipidemia, unspecified: Secondary | ICD-10-CM

## 2020-11-10 MED ORDER — ATORVASTATIN CALCIUM 10 MG PO TABS: 10.0000 mg | ORAL_TABLET | Freq: Every day | ORAL | 3 refills | Status: DC

## 2020-11-15 ENCOUNTER — Ambulatory Visit (HOSPITAL_COMMUNITY): Payer: Medicare Other | Attending: Cardiology

## 2020-11-15 ENCOUNTER — Other Ambulatory Visit: Payer: Self-pay

## 2020-11-15 DIAGNOSIS — I351 Nonrheumatic aortic (valve) insufficiency: Secondary | ICD-10-CM | POA: Diagnosis not present

## 2020-11-15 LAB — ECHOCARDIOGRAM COMPLETE
AR max vel: 1.73 cm2
AV Area VTI: 1.76 cm2
AV Area mean vel: 1.79 cm2
AV Mean grad: 28 mmHg
AV Peak grad: 51.7 mmHg
Ao pk vel: 3.59 m/s
Area-P 1/2: 4.08 cm2
MV VTI: 2.97 cm2
P 1/2 time: 603 msec
S' Lateral: 2.7 cm

## 2020-11-20 NOTE — H&P (View-Only) (Signed)
Cardiology Office Note:    Date:  11/20/2020   ID:  Rose Miller, DOB 05-Oct-1955, MRN [id number]  PCP:  Flossie Buffy, NP  Cardiologist:  None   Referring MD: Flossie Buffy, NP   No chief complaint on file.   History of Present Illness:    Rose Miller is a 65 y.o. female with a hx of obesity, severe hypertension, left ventricular hypertrophy, and moderate aortic stenosis.  Was cleared for colonoscopy earlier this year and had no complications.   Patient has aortic stenosis and regurgitation.  She is largely sedentary.  She denies orthopnea and PND.  She has had some nocturnal palpitations that seem to resolve when she sits up.  She has not had lower extremity swelling.  She denies angina.  No episodes of syncope.  If she does too much physical activity she gets short of breath.  She cannot gauge this because she does not get regular exercise.  Past Medical History:  Diagnosis Date   Adhesive capsulitis    Allergy    Aortic valve stenosis    Arthritis    Asthma    Back pain    Bilateral swelling of feet    CKD (chronic kidney disease) stage 3, GFR 30-59 ml/min (HCC)    Constipation    Diabetes mellitus without complication (HCC)    Diuretic-induced hypokalemia    Heart murmur    Heartburn    Hypercholesterolemia    Hypertension    IT band syndrome    Joint pain    Obesity    Palpitations    Periumbilical pain    Postmenopausal estrogen deficiency    Prediabetes    Shoulder pain, right    Swallowing difficulty    Ventral hernia     Past Surgical History:  Procedure Laterality Date   CESAREAN SECTION  1989   COLONOSCOPY     TONSILLECTOMY AND ADENOIDECTOMY  1960    Current Medications: No outpatient medications have been marked as taking for the 11/21/20 encounter (Appointment) with Belva Crome, MD.     Allergies:   Patient has no known allergies.   Social History   Socioeconomic History   Marital status: Single    Spouse name: Not on  file   Number of children: 1   Years of education: Not on file   Highest education level: Not on file  Occupational History   Occupation: retired  Tobacco Use   Smoking status: Former    Types: Cigarettes   Smokeless tobacco: Never  Scientific laboratory technician Use: Never used  Substance and Sexual Activity   Alcohol use: No    Alcohol/week: 0.0 standard drinks   Drug use: No   Sexual activity: Not on file  Other Topics Concern   Not on file  Social History Narrative   Not on file   Social Determinants of Health   Financial Resource Strain: Medium Risk   Difficulty of Paying Living Expenses: Somewhat hard  Food Insecurity: No Food Insecurity   Worried About Charity fundraiser in the Last Year: Never true   Ran Out of Food in the Last Year: Never true  Transportation Needs: No Transportation Needs   Lack of Transportation (Medical): No   Lack of Transportation (Non-Medical): No  Physical Activity: Inactive   Days of Exercise per Week: 0 days   Minutes of Exercise per Session: 0 min  Stress: No Stress Concern Present   Feeling of Stress : Not at  all  Social Connections: Socially Isolated   Frequency of Communication with Friends and Family: More than three times a week   Frequency of Social Gatherings with Friends and Family: Once a week   Attends Religious Services: Never   Marine scientist or Organizations: No   Attends Music therapist: Never   Marital Status: Never married     Family History: The patient's family history includes Arthritis in her mother; Cancer in her father and mother; Colon cancer in her father; Heart disease in her father; Hypertension in her mother; Obesity in her mother; Thyroid disease in her father.  ROS:   Please see the history of present illness.    We discussed the results of the recent echocardiogram.  All other systems reviewed and are negative.  EKGs/Labs/Other Studies Reviewed:    The following studies were reviewed  today:  2D Doppler echocardiogram November 15, 2020: IMPRESSIONS   1. Left ventricular ejection fraction, by estimation, is 70 to 75%. The  left ventricle has hyperdynamic function. The left ventricle has no  regional wall motion abnormalities. Left ventricular diastolic parameters  are consistent with Grade II diastolic  dysfunction (pseudonormalization). The average left ventricular global  longitudinal strain is 22.8 %. The global longitudinal strain is normal.   2. Right ventricular systolic function is normal. The right ventricular  size is normal. There is normal pulmonary artery systolic pressure.   3. Left atrial size was moderately dilated.   4. The mitral valve is normal in structure. Mild mitral valve  regurgitation. No evidence of mitral stenosis.   5. The aortic valve is tricuspid. There is severe calcifcation of the  aortic valve. There is severe thickening of the aortic valve. Aortic valve  regurgitation is severe. Moderate to severe aortic valve stenosis. Aortic  valve mean gradient measures 28.0  mmHg. Aortic valve Vmax measures 3.59 m/s.   6. Aortic dilatation noted. There is borderline dilatation of the  ascending aorta, measuring 39 mm.   7. The inferior vena cava is normal in size with greater than 50%  respiratory variability, suggesting right atrial pressure of 3 mmHg.   EKG:  EKG normal sinus rhythm, left atrial abnormality, interventricular conduction delay, Q-wave in aVL with QS pattern V1 and V2.  No change compared to prior tracing performed in 2022, January.  Recent Labs: 02/02/2020: ALT 13; Hemoglobin 11.4; Platelets 263; TSH 1.140 06/06/2020: BUN 19; Creatinine, Ser 1.38; Potassium 3.3; Sodium 136  Recent Lipid Panel    Component Value Date/Time   CHOL 137 06/06/2020 0951   TRIG 123.0 06/06/2020 0951   HDL 51.60 06/06/2020 0951   CHOLHDL 3 06/06/2020 0951   VLDL 24.6 06/06/2020 0951   LDLCALC 61 06/06/2020 0951    Physical Exam:    VS:  There  were no vitals taken for this visit.    Wt Readings from Last 3 Encounters:  10/18/20 (!) 355 lb (161 kg)  08/02/20 (!) 343 lb (155.6 kg)  07/19/20 (!) 339 lb (153.8 kg)     GEN: Morbid obesity. No acute distress HEENT: Normal NECK: No JVD. LYMPHATICS: No lymphadenopathy CARDIAC: 4/6 crescendo decrescendo aortic stenosis murmur right upper sternal border and also left mid sternal border.  3/6 decrescendo aortic regurgitation murmur. RRR no gallop, but there is trace to 1+ bilateral shin edema. VASCULAR:  Normal Pulses. No bruits. RESPIRATORY:  Clear to auscultation without rales, wheezing or rhonchi  ABDOMEN: Soft, non-tender, non-distended, No pulsatile mass, MUSCULOSKELETAL: No deformity  SKIN:  Warm and dry NEUROLOGIC:  Alert and oriented x 3 PSYCHIATRIC:  Normal affect   ASSESSMENT:    1. Aortic stenosis, moderate   2. Severe concentric left ventricular hypertrophy   3. Hyperlipidemia LDL goal <70   4. Morbid obesity ([place])   5. Essential hypertension    PLAN:    In order of problems listed above:  Moderate to severe aortic stenosis and severe aortic regurgitation with enlargement and systolic cavity size in comparison to 6 months ago.  Very difficult to assess the patient's functional status as she has traditionally been very sedentary.  With change in objective data, I have decided to proceed with left and right heart catheterization to assess hemodynamics and then probably referred the patient to the aortic valve clinic. Need to be sure that outflow gradients are not being influenced by subvalvular stenosis.  LV cavity has dilated somewhat at end diastole.  End-systolic dimension is still adequate. LDL 122.  She is on low intensity Lipitor.  We will see what her coronaries look like but will probably need significant intensification.  We will get the heart catheterization done then adjust therapy. Physical activity/exercise and decrease caloric intake discussed. Blood  pressure is adequately controlled on current regimen which includes Norvasc, hydralazine, Cozaar, and metoprolol succinate.   The patient was counseled to undergo left and right heart catheterization, coronary angiography, and possible percutaneous coronary intervention with stent implantation. The procedural risks and benefits were discussed in detail. The risks discussed included death, stroke, myocardial infarction, life-threatening bleeding, limb ischemia, kidney injury, allergy, and possible emergency cardiac surgery. The risk of these significant complications were estimated to occur less than 1% of the time. After discussion, the patient has agreed to proceed.  Natural history of aortic valve stenosis was discussed in detail.  Cardinal symptoms of angina, syncope, and dyspnea were reviewed and significance relative to prognosis was described.  The importance of sequential imaging for disease monitoring was emphasized.  Work-up including possible heart catheterization and CT angiography were described as essential components of staging for therapy.  Treatment options, TAVR and SAVR, were discussed in some detail with emphasis on TAVR.   Medication Adjustments/Labs and Tests Ordered: Current medicines are reviewed at length with the patient today.  Concerns regarding medicines are outlined above.  No orders of the defined types were placed in this encounter.  No orders of the defined types were placed in this encounter.   There are no Patient Instructions on file for this visit.   Signed, Sinclair Grooms, MD  11/20/2020 2:19 PM    [facility]

## 2020-11-20 NOTE — Progress Notes (Signed)
Cardiology Office Note:    Date:  11/20/2020   ID:  Rose Miller, DOB 1955-03-16, MRN [id number]  PCP:  Flossie Buffy, NP  Cardiologist:  None   Referring MD: Flossie Buffy, NP   No chief complaint on file.   History of Present Illness:    Rose Miller is a 65 y.o. female with a hx of obesity, severe hypertension, left ventricular hypertrophy, and moderate aortic stenosis.  Was cleared for colonoscopy earlier this year and had no complications.   Patient has aortic stenosis and regurgitation.  She is largely sedentary.  She denies orthopnea and PND.  She has had some nocturnal palpitations that seem to resolve when she sits up.  She has not had lower extremity swelling.  She denies angina.  No episodes of syncope.  If she does too much physical activity she gets short of breath.  She cannot gauge this because she does not get regular exercise.  Past Medical History:  Diagnosis Date   Adhesive capsulitis    Allergy    Aortic valve stenosis    Arthritis    Asthma    Back pain    Bilateral swelling of feet    CKD (chronic kidney disease) stage 3, GFR 30-59 ml/min (HCC)    Constipation    Diabetes mellitus without complication (HCC)    Diuretic-induced hypokalemia    Heart murmur    Heartburn    Hypercholesterolemia    Hypertension    IT band syndrome    Joint pain    Obesity    Palpitations    Periumbilical pain    Postmenopausal estrogen deficiency    Prediabetes    Shoulder pain, right    Swallowing difficulty    Ventral hernia     Past Surgical History:  Procedure Laterality Date   CESAREAN SECTION  1989   COLONOSCOPY     TONSILLECTOMY AND ADENOIDECTOMY  1960    Current Medications: No outpatient medications have been marked as taking for the 11/21/20 encounter (Appointment) with Belva Crome, MD.     Allergies:   Patient has no known allergies.   Social History   Socioeconomic History   Marital status: Single    Spouse name: Not on  file   Number of children: 1   Years of education: Not on file   Highest education level: Not on file  Occupational History   Occupation: retired  Tobacco Use   Smoking status: Former    Types: Cigarettes   Smokeless tobacco: Never  Scientific laboratory technician Use: Never used  Substance and Sexual Activity   Alcohol use: No    Alcohol/week: 0.0 standard drinks   Drug use: No   Sexual activity: Not on file  Other Topics Concern   Not on file  Social History Narrative   Not on file   Social Determinants of Health   Financial Resource Strain: Medium Risk   Difficulty of Paying Living Expenses: Somewhat hard  Food Insecurity: No Food Insecurity   Worried About Charity fundraiser in the Last Year: Never true   Ran Out of Food in the Last Year: Never true  Transportation Needs: No Transportation Needs   Lack of Transportation (Medical): No   Lack of Transportation (Non-Medical): No  Physical Activity: Inactive   Days of Exercise per Week: 0 days   Minutes of Exercise per Session: 0 min  Stress: No Stress Concern Present   Feeling of Stress : Not at  all  Social Connections: Socially Isolated   Frequency of Communication with Friends and Family: More than three times a week   Frequency of Social Gatherings with Friends and Family: Once a week   Attends Religious Services: Never   Marine scientist or Organizations: No   Attends Music therapist: Never   Marital Status: Never married     Family History: The patient's family history includes Arthritis in her mother; Cancer in her father and mother; Colon cancer in her father; Heart disease in her father; Hypertension in her mother; Obesity in her mother; Thyroid disease in her father.  ROS:   Please see the history of present illness.    We discussed the results of the recent echocardiogram.  All other systems reviewed and are negative.  EKGs/Labs/Other Studies Reviewed:    The following studies were reviewed  today:  2D Doppler echocardiogram November 15, 2020: IMPRESSIONS   1. Left ventricular ejection fraction, by estimation, is 70 to 75%. The  left ventricle has hyperdynamic function. The left ventricle has no  regional wall motion abnormalities. Left ventricular diastolic parameters  are consistent with Grade II diastolic  dysfunction (pseudonormalization). The average left ventricular global  longitudinal strain is 22.8 %. The global longitudinal strain is normal.   2. Right ventricular systolic function is normal. The right ventricular  size is normal. There is normal pulmonary artery systolic pressure.   3. Left atrial size was moderately dilated.   4. The mitral valve is normal in structure. Mild mitral valve  regurgitation. No evidence of mitral stenosis.   5. The aortic valve is tricuspid. There is severe calcifcation of the  aortic valve. There is severe thickening of the aortic valve. Aortic valve  regurgitation is severe. Moderate to severe aortic valve stenosis. Aortic  valve mean gradient measures 28.0  mmHg. Aortic valve Vmax measures 3.59 m/s.   6. Aortic dilatation noted. There is borderline dilatation of the  ascending aorta, measuring 39 mm.   7. The inferior vena cava is normal in size with greater than 50%  respiratory variability, suggesting right atrial pressure of 3 mmHg.   EKG:  EKG normal sinus rhythm, left atrial abnormality, interventricular conduction delay, Q-wave in aVL with QS pattern V1 and V2.  No change compared to prior tracing performed in 2022, January.  Recent Labs: 02/02/2020: ALT 13; Hemoglobin 11.4; Platelets 263; TSH 1.140 06/06/2020: BUN 19; Creatinine, Ser 1.38; Potassium 3.3; Sodium 136  Recent Lipid Panel    Component Value Date/Time   CHOL 137 06/06/2020 0951   TRIG 123.0 06/06/2020 0951   HDL 51.60 06/06/2020 0951   CHOLHDL 3 06/06/2020 0951   VLDL 24.6 06/06/2020 0951   LDLCALC 61 06/06/2020 0951    Physical Exam:    VS:  There  were no vitals taken for this visit.    Wt Readings from Last 3 Encounters:  10/18/20 (!) 355 lb (161 kg)  08/02/20 (!) 343 lb (155.6 kg)  07/19/20 (!) 339 lb (153.8 kg)     GEN: Morbid obesity. No acute distress HEENT: Normal NECK: No JVD. LYMPHATICS: No lymphadenopathy CARDIAC: 4/6 crescendo decrescendo aortic stenosis murmur right upper sternal border and also left mid sternal border.  3/6 decrescendo aortic regurgitation murmur. RRR no gallop, but there is trace to 1+ bilateral shin edema. VASCULAR:  Normal Pulses. No bruits. RESPIRATORY:  Clear to auscultation without rales, wheezing or rhonchi  ABDOMEN: Soft, non-tender, non-distended, No pulsatile mass, MUSCULOSKELETAL: No deformity  SKIN:  Warm and dry NEUROLOGIC:  Alert and oriented x 3 PSYCHIATRIC:  Normal affect   ASSESSMENT:    1. Aortic stenosis, moderate   2. Severe concentric left ventricular hypertrophy   3. Hyperlipidemia LDL goal <70   4. Morbid obesity ([place])   5. Essential hypertension    PLAN:    In order of problems listed above:  Moderate to severe aortic stenosis and severe aortic regurgitation with enlargement and systolic cavity size in comparison to 6 months ago.  Very difficult to assess the patient's functional status as she has traditionally been very sedentary.  With change in objective data, I have decided to proceed with left and right heart catheterization to assess hemodynamics and then probably referred the patient to the aortic valve clinic. Need to be sure that outflow gradients are not being influenced by subvalvular stenosis.  LV cavity has dilated somewhat at end diastole.  End-systolic dimension is still adequate. LDL 122.  She is on low intensity Lipitor.  We will see what her coronaries look like but will probably need significant intensification.  We will get the heart catheterization done then adjust therapy. Physical activity/exercise and decrease caloric intake discussed. Blood  pressure is adequately controlled on current regimen which includes Norvasc, hydralazine, Cozaar, and metoprolol succinate.   The patient was counseled to undergo left and right heart catheterization, coronary angiography, and possible percutaneous coronary intervention with stent implantation. The procedural risks and benefits were discussed in detail. The risks discussed included death, stroke, myocardial infarction, life-threatening bleeding, limb ischemia, kidney injury, allergy, and possible emergency cardiac surgery. The risk of these significant complications were estimated to occur less than 1% of the time. After discussion, the patient has agreed to proceed.  Natural history of aortic valve stenosis was discussed in detail.  Cardinal symptoms of angina, syncope, and dyspnea were reviewed and significance relative to prognosis was described.  The importance of sequential imaging for disease monitoring was emphasized.  Work-up including possible heart catheterization and CT angiography were described as essential components of staging for therapy.  Treatment options, TAVR and SAVR, were discussed in some detail with emphasis on TAVR.   Medication Adjustments/Labs and Tests Ordered: Current medicines are reviewed at length with the patient today.  Concerns regarding medicines are outlined above.  No orders of the defined types were placed in this encounter.  No orders of the defined types were placed in this encounter.   There are no Patient Instructions on file for this visit.   Signed, Sinclair Grooms, MD  11/20/2020 2:19 PM    [facility]

## 2020-11-21 ENCOUNTER — Encounter: Payer: Self-pay | Admitting: Interventional Cardiology

## 2020-11-21 ENCOUNTER — Ambulatory Visit: Payer: Medicare Other | Admitting: Interventional Cardiology

## 2020-11-21 ENCOUNTER — Other Ambulatory Visit: Payer: Self-pay

## 2020-11-21 VITALS — BP 138/72 | HR 68 | Ht 70.0 in | Wt 347.8 lb

## 2020-11-21 DIAGNOSIS — E785 Hyperlipidemia, unspecified: Secondary | ICD-10-CM | POA: Diagnosis not present

## 2020-11-21 DIAGNOSIS — I350 Nonrheumatic aortic (valve) stenosis: Secondary | ICD-10-CM

## 2020-11-21 DIAGNOSIS — I517 Cardiomegaly: Secondary | ICD-10-CM

## 2020-11-21 DIAGNOSIS — I10 Essential (primary) hypertension: Secondary | ICD-10-CM | POA: Diagnosis not present

## 2020-11-21 NOTE — Patient Instructions (Signed)
Medication Instructions:  Your physician recommends that you continue on your current medications as directed. Please refer to the Current Medication list given to you today.  *If you need a refill on your cardiac medications before your next appointment, please call your pharmacy*   Lab Work: BMET and CBC today  If you have labs (blood work) drawn today and your tests are completely normal, you will receive your results only by: [place] (if you have MyChart) OR A paper copy in the mail If you have any lab test that is abnormal or we need to change your treatment, we will call you to review the results.   Testing/Procedures: Your physician has requested that you have a cardiac catheterization. Cardiac catheterization is used to diagnose and/or treat various heart conditions. Doctors may recommend this procedure for a number of different reasons. The most common reason is to evaluate chest pain. Chest pain can be a symptom of coronary artery disease (CAD), and cardiac catheterization can show whether plaque is narrowing or blocking your heart's arteries. This procedure is also used to evaluate the valves, as well as measure the blood flow and oxygen levels in different parts of your heart. For further information please visit [email or web address]. Please follow instruction sheet, as given.    Follow-Up:   We will have you see the TAVR team after heart catheterization is completed.    Other Instructions   [facility] OFFICE [place], [address] [place] [address] Dept: [phone] Loc: [place]  11/21/2020  You are scheduled for a Cardiac Catheterization on Friday, November 11 with Dr. [name].  1. Please arrive at the [facility] (Main Entrance A) at [facility]: [address] [place], [place] [address] at 7:00 AM (This time is two hours before your  procedure to ensure your preparation). Free valet parking service is available.   Special note: Every effort is made to have your procedure done on time. Please understand that emergencies sometimes delay scheduled procedures.  2. Diet: Do not eat solid foods after midnight.  The patient may have clear liquids until 5am upon the day of the procedure.  3. Labs: You will have labs drawn today.  4. Medication instructions in preparation for your procedure:   Contrast Allergy: No    Stop taking, Cozaar (Losartan) Thursday, November 10,    On the morning of your procedure, take your Aspirin and any morning medicines NOT listed above.  You may use sips of water.  5. Plan for one night stay--bring personal belongings. 6. Bring a current list of your medications and current insurance cards. 7. You MUST have a responsible person to drive you home. 8. Someone MUST be with you the first 24 hours after you arrive home or your discharge will be delayed. 9. Please wear clothes that are easy to get on and off and wear slip-on shoes.  Thank you for allowing [place] to care for you!   -- [facility]

## 2020-11-22 ENCOUNTER — Telehealth: Payer: Self-pay | Admitting: Interventional Cardiology

## 2020-11-22 LAB — BASIC METABOLIC PANEL
BUN/Creatinine Ratio: 8 — ABNORMAL LOW (ref 12–28)
BUN: 9 mg/dL (ref 8–27)
CO2: 26 mmol/L (ref 20–29)
Calcium: 9.8 mg/dL (ref 8.7–10.3)
Chloride: 104 mmol/L (ref 96–106)
Creatinine, Ser: 1.13 mg/dL — ABNORMAL HIGH (ref 0.57–1.00)
Glucose: 103 mg/dL — ABNORMAL HIGH (ref 70–99)
Potassium: 4.2 mmol/L (ref 3.5–5.2)
Sodium: 142 mmol/L (ref 134–144)
eGFR: 54 mL/min/{1.73_m2} — ABNORMAL LOW (ref >59)

## 2020-11-22 LAB — CBC
Hematocrit: 35.5 % (ref 34.0–46.6)
Hemoglobin: 11.1 g/dL (ref 11.1–15.9)
MCH: 26.9 pg (ref 26.6–33.0)
MCHC: 31.3 g/dL — ABNORMAL LOW (ref 31.5–35.7)
MCV: 86 fL (ref 79–97)
Platelets: 192 10*3/uL (ref 150–450)
RBC: 4.13 x10E6/uL (ref 3.77–5.28)
RDW: 15.2 % (ref 11.7–15.4)
WBC: 4.8 10*3/uL (ref 3.4–10.8)

## 2020-11-22 NOTE — Telephone Encounter (Signed)
Patient states she was told that her lab results would determine whether she needs to arrive for 11/11 procedure at 7:00 AM or a later time.

## 2020-11-22 NOTE — Telephone Encounter (Signed)
Spoke with pt and advised that it does not look like she is going to have to come in as early for hydration.  Advised I will send message to our procedure nurse as she normally calls pt's the day before their cath.  Advised she will check behind me to make sure this is correct.  Pt appreciative for call.

## 2020-11-23 NOTE — Telephone Encounter (Signed)
Reviewed procedure/mask/visitor instructions with patient. Patient is aware that she does not need to arrive early for hydration.

## 2020-11-23 NOTE — Telephone Encounter (Addendum)
[facility] guideline if GFR < 45, arrange 4 hours pre-procedure hydration. 11/21/20 GFR 54-pt would not fall under guideline for pre-procedure hydration since GFR >45.   Patient should still hold losartan day before and day of procedure since GFR <60.

## 2020-11-23 NOTE — Telephone Encounter (Addendum)
Cardiac catheterization scheduled at [facility] for: Friday November 24, 2020 [facility] Main Entrance A [facility]) at: 10 AM   No solid food after midnight prior to cath, clear liquids until 5 AM day of procedure.  Medication instructions: Hold: Losartan-day before and day of procedure-GFR 54-per protocol   Except hold medications usual morning medications can be taken pre-cath with sips of water including aspirin 81 mg.    Confirmed patient has responsible adult to drive home post procedure and be with patient first 24 hours after arriving home.  [facility] does allow one visitor to accompany you and wait in the hospital waiting room while you are there for your procedure. You and your visitor will be asked to wear a mask once you enter the hospital.   Patient reports does not currently have any new symptoms concerning for COVID-19 and no household members with COVID-19 like illness.                 Reviewed procedure/mask/visitor instructions with patient.

## 2020-11-23 NOTE — Telephone Encounter (Signed)
Patient is returning call to discuss procedure instructions.

## 2020-11-24 ENCOUNTER — Ambulatory Visit (HOSPITAL_COMMUNITY)
Admission: RE | Disposition: A | Discharge status: Home / Self Care | Payer: Self-pay | Source: Home / Self Care | Attending: Interventional Cardiology

## 2020-11-24 ENCOUNTER — Ambulatory Visit (HOSPITAL_COMMUNITY)
Admission: RE | Admit: 2020-11-24 | Discharge: 2020-11-24 | Disposition: A | Discharge status: Home / Self Care | Payer: Medicare Other | Attending: Interventional Cardiology | Admitting: Interventional Cardiology

## 2020-11-24 ENCOUNTER — Other Ambulatory Visit: Payer: Self-pay

## 2020-11-24 DIAGNOSIS — Z6841 Body Mass Index (BMI) 40.0 and over, adult: Secondary | ICD-10-CM

## 2020-11-24 DIAGNOSIS — I119 Hypertensive heart disease without heart failure: Secondary | ICD-10-CM | POA: Diagnosis not present

## 2020-11-24 DIAGNOSIS — I272 Pulmonary hypertension, unspecified: Secondary | ICD-10-CM | POA: Diagnosis not present

## 2020-11-24 DIAGNOSIS — I350 Nonrheumatic aortic (valve) stenosis: Secondary | ICD-10-CM | POA: Diagnosis not present

## 2020-11-24 DIAGNOSIS — I352 Nonrheumatic aortic (valve) stenosis with insufficiency: Secondary | ICD-10-CM | POA: Diagnosis not present

## 2020-11-24 DIAGNOSIS — E785 Hyperlipidemia, unspecified: Secondary | ICD-10-CM | POA: Insufficient documentation

## 2020-11-24 DIAGNOSIS — E119 Type 2 diabetes mellitus without complications: Secondary | ICD-10-CM

## 2020-11-24 DIAGNOSIS — E669 Obesity, unspecified: Secondary | ICD-10-CM

## 2020-11-24 DIAGNOSIS — E1165 Type 2 diabetes mellitus with hyperglycemia: Secondary | ICD-10-CM

## 2020-11-24 DIAGNOSIS — N1832 Chronic kidney disease, stage 3b: Secondary | ICD-10-CM | POA: Diagnosis present

## 2020-11-24 DIAGNOSIS — N183 Chronic kidney disease, stage 3 unspecified: Secondary | ICD-10-CM | POA: Diagnosis present

## 2020-11-24 DIAGNOSIS — E1169 Type 2 diabetes mellitus with other specified complication: Secondary | ICD-10-CM

## 2020-11-24 DIAGNOSIS — I351 Nonrheumatic aortic (valve) insufficiency: Secondary | ICD-10-CM | POA: Diagnosis not present

## 2020-11-24 HISTORY — PX: RIGHT/LEFT HEART CATH AND CORONARY ANGIOGRAPHY: CATH118266

## 2020-11-24 LAB — POCT I-STAT EG7
Acid-Base Excess: 1 mmol/L (ref 0.0–2.0)
Bicarbonate: 26.8 mmol/L (ref 20.0–28.0)
Calcium, Ion: 1.25 mmol/L (ref 1.15–1.40)
HCT: 32 % — ABNORMAL LOW (ref 36.0–46.0)
Hemoglobin: 10.9 g/dL — ABNORMAL LOW (ref 12.0–15.0)
O2 Saturation: 65 %
Potassium: 3.6 mmol/L (ref 3.5–5.1)
Sodium: 142 mmol/L (ref 135–145)
TCO2: 28 mmol/L (ref 22–32)
pCO2, Ven: 48.7 mmHg (ref 44.0–60.0)
pH, Ven: 7.349 (ref 7.250–7.430)
pO2, Ven: 36 mmHg (ref 32.0–45.0)

## 2020-11-24 LAB — GLUCOSE, CAPILLARY
Glucose-Capillary: 121 mg/dL — ABNORMAL HIGH (ref 70–99)
Glucose-Capillary: 84 mg/dL (ref 70–99)

## 2020-11-24 LAB — POCT I-STAT 7, (LYTES, BLD GAS, ICA,H+H)
Acid-Base Excess: 0 mmol/L (ref 0.0–2.0)
Bicarbonate: 25.9 mmol/L (ref 20.0–28.0)
Calcium, Ion: 1.24 mmol/L (ref 1.15–1.40)
HCT: 31 % — ABNORMAL LOW (ref 36.0–46.0)
Hemoglobin: 10.5 g/dL — ABNORMAL LOW (ref 12.0–15.0)
O2 Saturation: 94 %
Potassium: 3.6 mmol/L (ref 3.5–5.1)
Sodium: 142 mmol/L (ref 135–145)
TCO2: 27 mmol/L (ref 22–32)
pCO2 arterial: 45.7 mmHg (ref 32.0–48.0)
pH, Arterial: 7.362 (ref 7.350–7.450)
pO2, Arterial: 73 mmHg — ABNORMAL LOW (ref 83.0–108.0)

## 2020-11-24 SURGERY — RIGHT/LEFT HEART CATH AND CORONARY ANGIOGRAPHY
Anesthesia: LOCAL

## 2020-11-24 MED ORDER — LIDOCAINE HCL (PF) 1 % IJ SOLN
INTRAMUSCULAR | Status: DC | PRN
  Administered 2020-11-24 (×2): 2 mL via INTRADERMAL

## 2020-11-24 MED ORDER — HEPARIN SODIUM (PORCINE) 1000 UNIT/ML IJ SOLN
INTRAMUSCULAR | Status: DC | PRN
  Administered 2020-11-24: 6500 [IU] via INTRAVENOUS

## 2020-11-24 MED ORDER — MIDAZOLAM HCL 2 MG/2ML IJ SOLN
INTRAMUSCULAR | Status: AC
  Filled 2020-11-24: qty 2

## 2020-11-24 MED ORDER — IOHEXOL 350 MG/ML SOLN
INTRAVENOUS | Status: DC | PRN
  Administered 2020-11-24: 120 mL via INTRA_ARTERIAL

## 2020-11-24 MED ORDER — HEPARIN SODIUM (PORCINE) 1000 UNIT/ML IJ SOLN
INTRAMUSCULAR | Status: AC
  Filled 2020-11-24: qty 1

## 2020-11-24 MED ORDER — SODIUM CHLORIDE 0.9 % IV SOLN: INTRAVENOUS | Status: DC

## 2020-11-24 MED ORDER — FENTANYL CITRATE (PF) 100 MCG/2ML IJ SOLN
INTRAMUSCULAR | Status: AC
  Filled 2020-11-24: qty 2

## 2020-11-24 MED ORDER — MIDAZOLAM HCL 2 MG/2ML IJ SOLN
INTRAMUSCULAR | Status: DC | PRN
  Administered 2020-11-24: 1 mg via INTRAVENOUS
  Administered 2020-11-24: .5 mg via INTRAVENOUS

## 2020-11-24 MED ORDER — FENTANYL CITRATE (PF) 100 MCG/2ML IJ SOLN
INTRAMUSCULAR | Status: DC | PRN
  Administered 2020-11-24: 50 ug via INTRAVENOUS
  Administered 2020-11-24: 25 ug via INTRAVENOUS

## 2020-11-24 MED ORDER — SODIUM CHLORIDE 0.9 % IV SOLN: 250.0000 mL | INTRAVENOUS | Status: DC | PRN

## 2020-11-24 MED ORDER — HEPARIN (PORCINE) IN NACL 1000-0.9 UT/500ML-% IV SOLN
INTRAVENOUS | Status: AC
  Filled 2020-11-24: qty 500

## 2020-11-24 MED ORDER — ACETAMINOPHEN 325 MG PO TABS: 650.0000 mg | ORAL_TABLET | ORAL | Status: DC | PRN

## 2020-11-24 MED ORDER — SODIUM CHLORIDE 0.9 % WEIGHT BASED INFUSION
3.0000 mL/kg/h | INTRAVENOUS | Status: AC
  Administered 2020-11-24: 3 mL/kg/h via INTRAVENOUS

## 2020-11-24 MED ORDER — VERAPAMIL HCL 2.5 MG/ML IV SOLN
INTRAVENOUS | Status: AC
  Filled 2020-11-24: qty 2

## 2020-11-24 MED ORDER — SODIUM CHLORIDE 0.9% FLUSH: 3.0000 mL | Freq: Two times a day (BID) | INTRAVENOUS | Status: DC

## 2020-11-24 MED ORDER — HEPARIN (PORCINE) IN NACL 1000-0.9 UT/500ML-% IV SOLN
INTRAVENOUS | Status: DC | PRN
  Administered 2020-11-24 (×2): 500 mL

## 2020-11-24 MED ORDER — SODIUM CHLORIDE 0.9% FLUSH: 3.0000 mL | INTRAVENOUS | Status: DC | PRN

## 2020-11-24 MED ORDER — HYDRALAZINE HCL 20 MG/ML IJ SOLN: 10.0000 mg | INTRAMUSCULAR | Status: DC | PRN

## 2020-11-24 MED ORDER — ONDANSETRON HCL 4 MG/2ML IJ SOLN: 4.0000 mg | Freq: Four times a day (QID) | INTRAMUSCULAR | Status: DC | PRN

## 2020-11-24 MED ORDER — LABETALOL HCL 5 MG/ML IV SOLN: 10.0000 mg | INTRAVENOUS | Status: DC | PRN

## 2020-11-24 MED ORDER — ASPIRIN 81 MG PO CHEW: 81.0000 mg | CHEWABLE_TABLET | ORAL | Status: DC

## 2020-11-24 MED ORDER — LIDOCAINE HCL (PF) 1 % IJ SOLN
INTRAMUSCULAR | Status: AC
  Filled 2020-11-24: qty 30

## 2020-11-24 MED ORDER — SODIUM CHLORIDE 0.9 % WEIGHT BASED INFUSION: 1.0000 mL/kg/h | INTRAVENOUS | Status: DC

## 2020-11-24 SURGICAL SUPPLY — 15 items
CATH BALLN WEDGE 5F 110CM (CATHETERS) ×2 IMPLANT
CATH INFINITI 5 FR JL3.5 (CATHETERS) ×2 IMPLANT
CATH INFINITI 5FR ANG PIGTAIL (CATHETERS) ×2 IMPLANT
CATH INFINITI JR4 5F (CATHETERS) ×2 IMPLANT
CATH LAUNCHER 5F EBU3.5 (CATHETERS) ×2 IMPLANT
DEVICE RAD COMP TR BAND LRG (VASCULAR PRODUCTS) ×2 IMPLANT
GLIDESHEATH SLEND A-KIT 6F 22G (SHEATH) ×2 IMPLANT
GUIDEWIRE INQWIRE 1.5J.035X260 (WIRE) ×1 IMPLANT
INQWIRE 1.5J .035X260CM (WIRE) ×2
KIT HEART LEFT (KITS) ×2 IMPLANT
PACK CARDIAC CATHETERIZATION (CUSTOM PROCEDURE TRAY) ×2 IMPLANT
SHEATH GLIDE SLENDER 4/5FR (SHEATH) ×2 IMPLANT
SHEATH PROBE COVER 6X72 (BAG) ×2 IMPLANT
TRANSDUCER W/STOPCOCK (MISCELLANEOUS) ×2 IMPLANT
TUBING CIL FLEX 10 FLL-RA (TUBING) ×2 IMPLANT

## 2020-11-24 NOTE — Interval H&P Note (Signed)
Cath Lab Visit (complete for each Cath Lab visit)  Clinical Evaluation Leading to the Procedure:   ACS: No.  Non-ACS:    Anginal Classification: CCS III  Anti-ischemic medical therapy: No Therapy  Non-Invasive Test Results: No non-invasive testing performed  Prior CABG: No previous CABG      History and Physical Interval Note:  11/24/2020 12:16 PM  Rose Miller  has presented today for surgery, with the diagnosis of aortic stenosis.  The various methods of treatment have been discussed with the patient and family. After consideration of risks, benefits and other options for treatment, the patient has consented to  Procedure(s): RIGHT/LEFT HEART CATH AND CORONARY ANGIOGRAPHY (N/A) as a surgical intervention.  The patient's history has been reviewed, patient examined, no change in status, stable for surgery.  I have reviewed the patient's chart and labs.  Questions were answered to the patient's satisfaction.     Belva Crome III

## 2020-11-24 NOTE — CV Procedure (Signed)
Normal coronary arteries Moderate to severe aortic regurgitation by aortic root angiography.  Mild to moderate aortic stenosis Elevated left ventricular end-diastolic pressure 32 mmHg. Mild pulmonary hypertension with mean pressure 31 mmHg.  Pulmonary capillary wedge mean pressure 24 mmHg. Institute therapy for blood pressure control to decrease regurgitant volume.  Referral for consideration of valve therapy, possibly surgical. The patient has minimal symptoms but is significantly sedentary.  May need exercise treadmill test.

## 2020-11-27 ENCOUNTER — Encounter (HOSPITAL_COMMUNITY): Payer: Self-pay | Admitting: Interventional Cardiology

## 2020-11-27 MED FILL — Verapamil HCl IV Soln 2.5 MG/ML: INTRAVENOUS | Qty: 2 | Status: AC

## 2020-11-27 NOTE — Progress Notes (Signed)
Patient ID: Rose Miller MRN: [id number] DOB/AGE: September 14, 1955 65 y.o.  Primary Care Physician:[name], [name], NP Primary Cardiologist: [name], MD   PROBLEM LIST: Severe aortic valvular disease consisting of moderate to severe aortic stenosis with AVA 0.78cm2, with a mean gradient of 28 mmHg and severe aortic insufficiency with an ejection fraction of 70%; EKG demonstrates IVCD Mild obstructive coronary artery disease Hypertension Hyperlipidemia BMI > 40   HISTORY OF PRESENT ILLNESS: The patient is a 65 y.o. year old female with the indicated medical issues referred by Dr. [name] for recommendations regarding the patient's aortic valvular disease.  The patient was noted to develop progression of aortic insufficiency and aortic stenosis.  She was referred for cardiac catheterization which demonstrated mild obstructive coronary artery disease, elevated wedge pressure of 24 mmHg with V waves to 40 mmHg and an LVEDP of 32 mmHg.  The peak to peak pressure gradient on pullback was 48mmHg.    The patient tells me that she does not do much in the day.  After extensive questioning she relates that over the last several years she has had increasing shortness of breath.  She told me that walking from her house to her sister-in-law's car this morning made her short of breath.  She denies any exertional chest pain.  She has had no presyncope or syncope.  She denies any palpitations.  She has never had a stroke.  She used to work at the [employer] and retired more than 10 years ago.  She tells me her weight has been relatively unchanged since she retired.  She has not seen it dentist in several years and says she has poor dentition.  Past Medical History:  Diagnosis Date   Adhesive capsulitis    Allergy    Aortic valve stenosis    Arthritis    Asthma    Back pain    Bilateral swelling of feet    CKD (chronic kidney disease) stage 3, GFR 30-59 ml/min (HCC)    Constipation     Diabetes mellitus without complication (HCC)    Diuretic-induced hypokalemia    Heart murmur    Heartburn    Hypercholesterolemia    Hypertension    IT band syndrome    Joint pain    Obesity    Palpitations    Periumbilical pain    Postmenopausal estrogen deficiency    Prediabetes    Shoulder pain, right    Swallowing difficulty    Ventral hernia     Past Surgical History:  Procedure Laterality Date   CESAREAN SECTION  1989   COLONOSCOPY     RIGHT/LEFT HEART CATH AND CORONARY ANGIOGRAPHY N/A 11/24/2020   Procedure: RIGHT/LEFT HEART CATH AND CORONARY ANGIOGRAPHY;  Surgeon: Belva Crome, MD;  Location: [facility];  Service: Cardiovascular;  Laterality: N/A;   TONSILLECTOMY AND ADENOIDECTOMY  1960    Family History  Problem Relation Age of Onset   Arthritis Mother    Cancer Mother        pancreatic   Hypertension Mother    Obesity Mother    Cancer Father        prostate and colon   Colon cancer Father    Heart disease Father    Thyroid disease Father     Social History   Socioeconomic History   Marital status: Single    Spouse name: Not on file   Number of children: 1   Years of education: Not on file  Highest education level: Not on file  Occupational History   Occupation: retired  Tobacco Use   Smoking status: Former    Types: Cigarettes   Smokeless tobacco: Never  Scientific laboratory technician Use: Never used  Substance and Sexual Activity   Alcohol use: No    Alcohol/week: 0.0 standard drinks   Drug use: No   Sexual activity: Not on file  Other Topics Concern   Not on file  Social History Narrative   Not on file   Social Determinants of Health   Financial Resource Strain: Medium Risk   Difficulty of Paying Living Expenses: Somewhat hard  Food Insecurity: No Food Insecurity   Worried About Charity fundraiser in the Last Year: Never true   Ran Out of Food in the Last Year: Never true  Transportation Needs: No Transportation Needs   Lack of  Transportation (Medical): No   Lack of Transportation (Non-Medical): No  Physical Activity: Inactive   Days of Exercise per Week: 0 days   Minutes of Exercise per Session: 0 min  Stress: No Stress Concern Present   Feeling of Stress : Not at all  Social Connections: Socially Isolated   Frequency of Communication with Friends and Family: More than three times a week   Frequency of Social Gatherings with Friends and Family: Once a week   Attends Religious Services: Never   Marine scientist or Organizations: No   Attends Music therapist: Never   Marital Status: Never married  Human resources officer Violence: Not At Risk   Fear of Current or Ex-Partner: No   Emotionally Abused: No   Physically Abused: No   Sexually Abused: No     Prior to Admission medications   Medication Sig Start Date End Date Taking? Authorizing Provider  amLODipine (NORVASC) 10 MG tablet TAKE 1 TABLET BY MOUTH  DAILY 01/19/20   [name], [name], NP  Ascorbic Acid (VITAMIN C) 1000 MG tablet Take 1,000 mg by mouth daily.    [provider]  atorvastatin (LIPITOR) 10 MG tablet Take 1 tablet (10 mg total) by mouth daily. 11/10/20   Belva Crome, MD  Bayer Microlet Lancets lancets Use when testing glucose as recommended. 11/27/18   [provider]  Blood Pressure Monitoring (SPHYGMOMANOMETER) MISC 1 Units by Does not apply route daily. 07/30/16   [name], [name], NP  Cholecalciferol 50 MCG (2000 UT) TABS Take 2,000 Units by mouth daily.    [provider]  Coenzyme Q10 (CO Q-10) 100 MG CAPS Take 100 mg by mouth daily.     [provider]  Cyanocobalamin (B-12) 500 MCG TABS Take 500 mcg by mouth daily.    [provider]  glucose blood (CONTOUR NEXT TEST) test strip Test up to 4 times per day as recommended 11/27/18   [provider]  hydrALAZINE (APRESOLINE) 25 MG tablet TAKE 1 TABLET BY MOUTH  TWICE DAILY 01/19/20   [name], [name], NP  Insulin  Pen Needle (BD PEN NEEDLE NANO 2ND GEN) 32G X 4 MM MISC 1 Package by Does not apply route 2 (two) times daily. 03/15/20   Laqueta Linden, MD  losartan (COZAAR) 100 MG tablet Take 1 tablet (100 mg total) by mouth daily. 06/16/20   [name], [name], NP  metoprolol succinate (TOPROL-XL) 25 MG 24 hr tablet Take 25 mg by mouth daily. 07/25/20   [provider]  neomycin-polymyxin-hydrocortisone (CORTISPORIN) OTIC solution Apply 1-2 drops to toenail if  there is soreness once daily after shower Patient taking differently: 1-2 drops See admin instructions. Apply 1-2 drops to toenail if there is soreness once daily after shower as needed 07/27/20   Landis Martins, DPM  Zinc 50 MG CAPS Take 50 mg by mouth.    [provider]    No Known Allergies  REVIEW OF SYSTEMS:  General: no fevers/chills/night sweats Eyes: no blurry vision, diplopia, or amaurosis ENT: no sore throat or hearing loss Resp: no cough, wheezing, or hemoptysis CV: no edema or palpitations GI: no abdominal pain, nausea, vomiting, diarrhea, or constipation GU: no dysuria, frequency, or hematuria Skin: no rash Neuro: no headache, numbness, tingling, or weakness of extremities Musculoskeletal: no joint pain or swelling Heme: no bleeding, DVT, or easy bruising Endo: no polydipsia or polyuria  BP 138/60   Pulse 81   Ht 5\' 10"  (1.778 m)   Wt (!) 340 lb (154.2 kg)   SpO2 98%   BMI 48.78 kg/m   PHYSICAL EXAM: GEN:  AO x 3 in no acute distress HEENT: normal Dentition: Poor   Neck: JVP normal. +2 carotid upstrokes without bruits. No thyromegaly. Lungs: equal expansion, clear bilaterally CV: Apex is discrete and nondisplaced, RRR 3/6 systolic murmur, unable to hear diastolic murmur Abd: soft, non-tender, non-distended; no bruit; positive bowel sounds Ext: no edema, ecchymoses, or cyanosis Vascular: 2+ femoral pulses, 2+ radial pulses       Skin: warm and dry without rash Neuro: CN II-XII grossly intact;  motor and sensory grossly intact    DATA AND STUDIES:  EKG: Sinus rhythm with IVCD  2D ECHO:  11/22  1. Left ventricular ejection fraction, by estimation, is 70 to 75%. The  left ventricle has hyperdynamic function. The left ventricle has no  regional wall motion abnormalities. Left ventricular diastolic parameters  are consistent with Grade II diastolic  dysfunction (pseudonormalization). The average left ventricular global  longitudinal strain is 22.8 %. The global longitudinal strain is normal.   2. Right ventricular systolic function is normal. The right ventricular  size is normal. There is normal pulmonary artery systolic pressure.   3. Left atrial size was moderately dilated.   4. The mitral valve is normal in structure. Mild mitral valve  regurgitation. No evidence of mitral stenosis.   5. The aortic valve is tricuspid. There is severe calcifcation of the  aortic valve. There is severe thickening of the aortic valve. Aortic valve  regurgitation is severe. Moderate to severe aortic valve stenosis. Aortic  valve mean gradient measures 28.0  mmHg. Aortic valve Vmax measures 3.59 m/s.   6. Aortic dilatation noted. There is borderline dilatation of the  ascending aorta, measuring 39 mm.   7. The inferior vena cava is normal in size with greater than 50%  respiratory variability, suggesting right atrial pressure of 3 mmHg.   CARDIAC CATH:  11/22 Mildly dilated ascending aorta. Moderately severe aortic regurgitation.  Mild aortic stenosis. Moderately severe elevation in left ventricular end-diastolic pressure, 32 mmHg. Elevated mean pulmonary wedge pressure, 24 mmHg.  V wave 40 mmHg. Mild pulmonary hypertension with PA mean 31 mmHg and phasic pressure 54 over 17 mmHg. Widely patent coronary arteries without anomalous origin.   STS RISK CALCULATOR: 3.2%  NHYA CLASS: 2-3    ASSESSMENT AND PLAN:   Nonrheumatic aortic valve stenosis The patient has developed dyspnea.  I  reviewed her imaging data.  Her echocardiogram shows a very highly calcified valve with a mean gradient of close to 30 mmHg  with significant aortic insufficiency with a preserved ejection fraction.  I did review her cardiac catheterization report and films.  Her peak to peak gradient is only only 17 mmHg but this may be due to pressure recovery phenomenon.  After an extensive discussion with the patient is clear that she has developed symptoms attributable to her aortic stenosis.  Her weight has been relatively stable over the last 10 or so years so I do not think her symptoms are from weight gain.  She is relatively sedentary and so deconditioning certainly could be contributing.  I spoke to her about potential treatment options.  Given her relatively young age, a surgical approach may be her best option.  I did broach the subject of the mechanical valve with her but she seemed reluctant to take obligate Coumadin.  We will obtain CT scan to evaluate further.  I will have her see cardiothoracic surgery.  She will need to see a dentist as well given her history of poor dentition.  I did try to palpate her femoral artery pulse and quite a challenge with this.  If she is not felt to be best served by surgery then perhaps alternative access may be her best option.  Further recommendations will follow after the CT and surgical opinion are available.  I have personally reviewed the patients imaging data as summarized above.  I have reviewed the natural history of aortic stenosis with the patient and family members who are present today. We have discussed the limitations of medical therapy and the poor prognosis associated with symptomatic aortic stenosis. We have also reviewed potential treatment options, including palliative medical therapy, conventional surgical aortic valve replacement, and transcatheter aortic valve replacement. We discussed treatment options in the context of this patient's specific comorbid  medical conditions.   All of the patient's questions were answered today. Will make further recommendations based on the results of studies outlined above.   Early Osmond, MD  11/28/2020 11:35 AM    [facility] [place], [place], [place]  [address] Phone: [phone]; Fax: [phone]

## 2020-11-28 ENCOUNTER — Ambulatory Visit: Payer: Medicare Other | Admitting: Internal Medicine

## 2020-11-28 ENCOUNTER — Encounter: Payer: Self-pay | Admitting: Internal Medicine

## 2020-11-28 ENCOUNTER — Other Ambulatory Visit: Payer: Self-pay

## 2020-11-28 VITALS — BP 138/60 | HR 81 | Ht 70.0 in | Wt 340.0 lb

## 2020-11-28 DIAGNOSIS — I350 Nonrheumatic aortic (valve) stenosis: Secondary | ICD-10-CM | POA: Diagnosis not present

## 2020-11-28 NOTE — Progress Notes (Signed)
Pre Surgical Assessment: 5 M Walk Test  84M=16.21ft  5 Meter Walk Test- trial 1: 6.08 seconds 5 Meter Walk Test- trial 2: 5.00 seconds 5 Meter Walk Test- trial 3: 4.93 seconds 5 Meter Walk Test Average: 5.33 seconds

## 2020-11-28 NOTE — Patient Instructions (Signed)
Medication Instructions:  No changes *If you need a refill on your cardiac medications before your next appointment, please call your pharmacy*   Lab Work: none If you have labs (blood work) drawn today and your tests are completely normal, you will receive your results only by: [facility] [facility] (if you have MyChart) OR A paper copy in the mail If you have any lab test that is abnormal or we need to change your treatment, we will call you to review the results.   Testing/Procedures: none   Follow-Up: Per Structural Heart Valve Team Other Instructions

## 2020-11-29 ENCOUNTER — Other Ambulatory Visit: Payer: Self-pay | Admitting: *Deleted

## 2020-11-29 DIAGNOSIS — I350 Nonrheumatic aortic (valve) stenosis: Secondary | ICD-10-CM

## 2020-12-01 ENCOUNTER — Other Ambulatory Visit: Payer: Self-pay | Admitting: Cardiology

## 2020-12-01 ENCOUNTER — Encounter: Payer: Self-pay | Admitting: Cardiology

## 2020-12-01 DIAGNOSIS — I350 Nonrheumatic aortic (valve) stenosis: Secondary | ICD-10-CM

## 2020-12-01 DIAGNOSIS — Z7689 Persons encountering health services in other specified circumstances: Secondary | ICD-10-CM

## 2020-12-01 NOTE — Progress Notes (Signed)
Pre-TAVR Testing:  You are scheduled for pre-TAVR CT scans  on 12/14/20 at [facility] (Main Entrance A, [address] Parking). Please check in at 10:30am in Radiology (first floor). Your CT scans (of chest/abdomen/pelvis/heart) will begin at 11:00am.  Please follow these instructions carefully:  On the Night Before the Test: Be sure to Drink plenty of water. Do not consume any caffeinated/decaffeinated beverages or chocolate 12 hours prior to your test. Do not take any antihistamines 12 hours prior to your test.   On the Day of the Test: Drink plenty of water until 1 hour prior to the test. Do not eat any food 4 hours prior to the test. You may take your regular medications prior to the test.  Take metoprolol (Toprol XL) two hours prior to test. Please take an extra tablet the day of your procedure to equal 50mg  Toprol XL that day ONLY.  Please wear underwire-free bra if available   After the Test: Drink plenty of water. After receiving IV contrast, you may experience a mild flushed feeling. This is normal. On occasion, you may experience a mild rash up to 24 hours after the test. This is not dangerous. If this occurs, you can take Benadryl 25 mg and increase your fluid intake. If you experience trouble breathing, this can be serious. If it is severe call 911 IMMEDIATELY. If it is mild, please call our office.

## 2020-12-04 ENCOUNTER — Telehealth (HOSPITAL_COMMUNITY): Payer: Self-pay

## 2020-12-04 ENCOUNTER — Other Ambulatory Visit: Payer: Self-pay

## 2020-12-04 ENCOUNTER — Ambulatory Visit
Admission: RE | Admit: 2020-12-04 | Discharge: 2020-12-04 | Disposition: A | Discharge status: Home / Self Care | Payer: Medicare Other | Source: Ambulatory Visit | Attending: Nurse Practitioner | Admitting: Nurse Practitioner

## 2020-12-04 DIAGNOSIS — Z1231 Encounter for screening mammogram for malignant neoplasm of breast: Secondary | ICD-10-CM | POA: Diagnosis not present

## 2020-12-04 DIAGNOSIS — I351 Nonrheumatic aortic (valve) insufficiency: Secondary | ICD-10-CM

## 2020-12-04 DIAGNOSIS — I350 Nonrheumatic aortic (valve) stenosis: Secondary | ICD-10-CM

## 2020-12-05 ENCOUNTER — Telehealth (HOSPITAL_COMMUNITY): Payer: Self-pay

## 2020-12-11 ENCOUNTER — Telehealth (HOSPITAL_COMMUNITY): Payer: Self-pay

## 2020-12-12 ENCOUNTER — Other Ambulatory Visit: Payer: Self-pay

## 2020-12-12 ENCOUNTER — Encounter: Payer: Self-pay | Admitting: Nurse Practitioner

## 2020-12-12 ENCOUNTER — Ambulatory Visit (INDEPENDENT_AMBULATORY_CARE_PROVIDER_SITE_OTHER): Payer: Medicare Other | Admitting: Nurse Practitioner

## 2020-12-12 VITALS — BP 146/70 | HR 70 | Temp 96.7°F | Wt 340.8 lb

## 2020-12-12 DIAGNOSIS — I10 Essential (primary) hypertension: Secondary | ICD-10-CM

## 2020-12-12 DIAGNOSIS — E1165 Type 2 diabetes mellitus with hyperglycemia: Secondary | ICD-10-CM

## 2020-12-12 LAB — POCT GLYCOSYLATED HEMOGLOBIN (HGB A1C): Hemoglobin A1C: 6.1 % — AB (ref 4.0–5.6)

## 2020-12-12 MED ORDER — LOSARTAN POTASSIUM 100 MG PO TABS: 100.0000 mg | ORAL_TABLET | Freq: Every day | ORAL | 3 refills | Status: DC

## 2020-12-12 MED ORDER — AMLODIPINE BESYLATE 10 MG PO TABS
10.0000 mg | ORAL_TABLET | Freq: Every day | ORAL | 3 refills | Status: DC
Start: 2020-12-12 — End: 2021-02-20

## 2020-12-12 MED ORDER — HYDRALAZINE HCL 25 MG PO TABS: 25.0000 mg | ORAL_TABLET | Freq: Two times a day (BID) | ORAL | 3 refills | Status: DC

## 2020-12-12 NOTE — Progress Notes (Signed)
Subjective:  Patient ID: Rose Miller, female    DOB: 12/16/1955  Age: 65 y.o. MRN: [id number]  CC: Follow-up (6 month f/u on HTN and cholesterol. /Pt is fasting. /Declines all vaccines )  HPI  DM (diabetes mellitus) ([place]) Repeat hgbA1c at 6.1% today Controlled without medication. Up to date with eye and foot exam. Declined influenza and pneumococcal vaccine. LDL at goal with atorvastatin Repeat hgbA1c in 39months  Essential hypertension Stable BP with hydralazine, losartan and amlodipine. Reports complians with medication and DASh diet BP Readings from Last 3 Encounters:  12/12/20 (!) 146/70  11/28/20 138/60  11/24/20 (!) 111/58   Maintain med doses Refills sent  BP Readings from Last 3 Encounters:  12/12/20 (!) 146/70  11/28/20 138/60  11/24/20 (!) 111/58    Wt Readings from Last 3 Encounters:  12/12/20 (!) 340 lb 12.8 oz (154.6 kg)  11/28/20 (!) 340 lb (154.2 kg)  11/24/20 (!) 347 lb (157.4 kg)    Reviewed past Medical, Social and Family history today.  Outpatient Medications Prior to Visit  Medication Sig Dispense Refill   Ascorbic Acid (VITAMIN C) 1000 MG tablet Take 1,000 mg by mouth daily.     atorvastatin (LIPITOR) 10 MG tablet Take 1 tablet (10 mg total) by mouth daily. 90 tablet 3   Bayer Microlet Lancets lancets Use when testing glucose as recommended.     Blood Pressure Monitoring (SPHYGMOMANOMETER) MISC 1 Units by Does not apply route daily. 1 each 0   Cholecalciferol 50 MCG (2000 UT) TABS Take 2,000 Units by mouth daily.     Coenzyme Q10 (CO Q-10) 100 MG CAPS Take 100 mg by mouth daily.      Cyanocobalamin (B-12) 500 MCG TABS Take 500 mcg by mouth daily.     glucose blood (CONTOUR NEXT TEST) test strip Test up to 4 times per day as recommended     Insulin Pen Needle (BD PEN NEEDLE NANO 2ND GEN) 32G X 4 MM MISC 1 Package by Does not apply route 2 (two) times daily. 100 each 0   metoprolol succinate (TOPROL-XL) 25 MG 24 hr tablet Take 25 mg by mouth  daily.     neomycin-polymyxin-hydrocortisone (CORTISPORIN) OTIC solution Apply 1-2 drops to toenail if there is soreness once daily after shower (Patient taking differently: 1-2 drops See admin instructions. Apply 1-2 drops to toenail if there is soreness once daily after shower as needed) 10 mL 0   Zinc 50 MG CAPS Take 50 mg by mouth.     amLODipine (NORVASC) 10 MG tablet TAKE 1 TABLET BY MOUTH  DAILY 90 tablet 3   hydrALAZINE (APRESOLINE) 25 MG tablet TAKE 1 TABLET BY MOUTH  TWICE DAILY 180 tablet 3   losartan (COZAAR) 100 MG tablet Take 1 tablet (100 mg total) by mouth daily. 90 tablet 3   No facility-administered medications prior to visit.    ROS See HPI  Objective:  BP (!) 146/70 (BP Location: Left Arm, Patient Position: Sitting, Cuff Size: Large)   Pulse 70   Temp (!) 96.7 F (35.9 C) (Temporal)   Wt (!) 340 lb 12.8 oz (154.6 kg)   SpO2 98%   BMI 48.90 kg/m   Physical Exam Cardiovascular:     Rate and Rhythm: Normal rate and regular rhythm.     Pulses: Normal pulses.     Heart sounds: Murmur heard.  Pulmonary:     Effort: Pulmonary effort is normal.     Breath sounds: Normal breath sounds.  Musculoskeletal:  Right lower leg: No edema.     Left lower leg: No edema.  Neurological:     Mental Status: She is alert and oriented to person, place, and time.   Assessment & Plan:  This visit occurred during the SARS-CoV-2 public health emergency.  Safety protocols were in place, including screening questions prior to the visit, additional usage of staff PPE, and extensive cleaning of exam room while observing appropriate contact time as indicated for disinfecting solutions.   Rose Miller was seen today for follow-up.  Diagnoses and all orders for this visit:  Essential hypertension -     amLODipine (NORVASC) 10 MG tablet; Take 1 tablet (10 mg total) by mouth daily. -     hydrALAZINE (APRESOLINE) 25 MG tablet; Take 1 tablet (25 mg total) by mouth 2 (two) times daily. -      losartan (COZAAR) 100 MG tablet; Take 1 tablet (100 mg total) by mouth daily.  Type 2 diabetes mellitus with hyperglycemia, without long-term current use of insulin (HCC) -     POCT glycosylated hemoglobin (Hb A1C)   Problem List Items Addressed This Visit       Cardiovascular and Mediastinum   Essential hypertension - Primary    Stable BP with hydralazine, losartan and amlodipine. Reports complians with medication and DASh diet BP Readings from Last 3 Encounters:  12/12/20 (!) 146/70  11/28/20 138/60  11/24/20 (!) 111/58   Maintain med doses Refills sent      Relevant Medications   amLODipine (NORVASC) 10 MG tablet   hydrALAZINE (APRESOLINE) 25 MG tablet   losartan (COZAAR) 100 MG tablet     Endocrine   DM (diabetes mellitus) ([place])    Repeat hgbA1c at 6.1% today Controlled without medication. Up to date with eye and foot exam. Declined influenza and pneumococcal vaccine. LDL at goal with atorvastatin Repeat hgbA1c in 74months      Relevant Medications   losartan (COZAAR) 100 MG tablet   Other Relevant Orders   POCT glycosylated hemoglobin (Hb A1C)    Follow-up: Return in about 6 months (around 06/11/2021) for CPE (fasting).  Wilfred Lacy, NP

## 2020-12-12 NOTE — Assessment & Plan Note (Addendum)
Repeat hgbA1c at 6.1% today Controlled without medication. Up to date with eye and foot exam. Declined influenza and pneumococcal vaccine. LDL at goal with atorvastatin Repeat hgbA1c in 47months

## 2020-12-12 NOTE — Assessment & Plan Note (Signed)
Stable BP with hydralazine, losartan and amlodipine. Reports complians with medication and DASh diet BP Readings from Last 3 Encounters:  12/12/20 (!) 146/70  11/28/20 138/60  11/24/20 (!) 111/58   Maintain med doses Refills sent

## 2020-12-12 NOTE — Patient Instructions (Signed)
hgbA1c of 6.1% today. Diabetes is controlled. Maintain current medications. F/up in 37months (fasting)

## 2020-12-14 ENCOUNTER — Other Ambulatory Visit: Payer: Self-pay

## 2020-12-14 ENCOUNTER — Ambulatory Visit (HOSPITAL_COMMUNITY)
Admission: RE | Admit: 2020-12-14 | Discharge: 2020-12-14 | Disposition: A | Discharge status: Home / Self Care | Payer: Medicare Other | Source: Ambulatory Visit | Attending: Cardiology | Admitting: Cardiology

## 2020-12-14 DIAGNOSIS — K5730 Diverticulosis of large intestine without perforation or abscess without bleeding: Secondary | ICD-10-CM | POA: Diagnosis not present

## 2020-12-14 DIAGNOSIS — I517 Cardiomegaly: Secondary | ICD-10-CM | POA: Diagnosis not present

## 2020-12-14 DIAGNOSIS — J811 Chronic pulmonary edema: Secondary | ICD-10-CM | POA: Diagnosis not present

## 2020-12-14 DIAGNOSIS — I351 Nonrheumatic aortic (valve) insufficiency: Secondary | ICD-10-CM | POA: Insufficient documentation

## 2020-12-14 DIAGNOSIS — I350 Nonrheumatic aortic (valve) stenosis: Secondary | ICD-10-CM | POA: Diagnosis not present

## 2020-12-14 DIAGNOSIS — J810 Acute pulmonary edema: Secondary | ICD-10-CM | POA: Diagnosis not present

## 2020-12-14 DIAGNOSIS — K8020 Calculus of gallbladder without cholecystitis without obstruction: Secondary | ICD-10-CM | POA: Diagnosis not present

## 2020-12-14 MED ORDER — IOHEXOL 350 MG/ML SOLN
125.0000 mL | Freq: Once | INTRAVENOUS | Status: AC | PRN
  Administered 2020-12-14: 125 mL via INTRAVENOUS

## 2020-12-18 ENCOUNTER — Telehealth (HOSPITAL_COMMUNITY): Payer: Self-pay

## 2020-12-18 ENCOUNTER — Encounter: Payer: Self-pay | Admitting: Surgery

## 2020-12-18 ENCOUNTER — Other Ambulatory Visit: Payer: Self-pay

## 2020-12-18 ENCOUNTER — Institutional Professional Consult (permissible substitution): Payer: Medicare Other | Admitting: Surgery

## 2020-12-18 VITALS — BP 180/74 | HR 91 | Resp 20 | Ht 70.0 in | Wt 340.0 lb

## 2020-12-18 DIAGNOSIS — I350 Nonrheumatic aortic (valve) stenosis: Secondary | ICD-10-CM

## 2020-12-18 NOTE — Progress Notes (Signed)
Patient ID: Rose Miller, female   DOB: 03-02-1955, 65 y.o.   MRN: [id number] Patient ID: Rose Miller, female   DOB: [birth year], 65 y.o.   MRN: [id number] [facility]          [address]       [place] [address]             [phone]                      CARDIOTHORACIC SURGERY CONSULTATION REPORT   PCP is [name], [name], NP Referring Provider is Lenna Sciara, MD Primary Cardiologist is None   Reason for consultation:  Moderate aortic stenosis and severe aortic insufficiency   HPI:   The patient is a 65 year old woman with a history of morbid obesity, hypertension, hyperlipidemia, type 2 diabetes, and stage III chronic kidney disease who presents with a several month history of exertional shortness of breath and fatigue.  She reports having shortness of breath with walking around in stores and had some shortness of breath walking into the office this morning.  She denies any chest pain or pressure.  She has had no dizziness or syncope. She has no lower extremity edema.  She denies orthopnea and PND.  A 2D echocardiogram on 11/15/2020 showed a trileaflet aortic valve with severe calcification and thickening.  The mean gradient was 28 mmHg with a peak gradient of 52 mmHg.  AI pressure half-time was measured at 603 ms although visually the aortic valve regurgitation is severe.  There is no mitral stenosis and only mild mitral regurgitation.  Left ventricular ejection fraction is 70 to 75%.  There is grade 2 diastolic dysfunction.  Cardiac catheterization showed no significant coronary disease.  PA pressure was elevated at 54/17 with a mean of 31.  Mean pulmonary capillary wedge pressure was 24.  LVEDP was 34.  Cardiac index was 3.0.  Mean right atrial pressure was 5.   The patient is retired from the post office.  She is single and has 1 adult child.  She is here today with her sister.  She has a fairly sedentary  lifestyle and does not exercise.     Past Medical History:  Diagnosis Date   Adhesive capsulitis     Allergy     Aortic valve stenosis     Arthritis     Asthma     Back pain     Bilateral swelling of feet     CKD (chronic kidney disease) stage 3, GFR 30-59 ml/min (HCC)     Constipation     Diabetes mellitus without complication (HCC)     Diuretic-induced hypokalemia     Heart murmur     Heartburn     Hypercholesterolemia     Hypertension     IT band syndrome     Joint pain     Obesity     Palpitations     Periumbilical pain     Postmenopausal estrogen deficiency     Prediabetes     Shoulder pain, right     Swallowing difficulty     Ventral hernia             Past Surgical History:  Procedure Laterality Date   CESAREAN SECTION   1989   COLONOSCOPY       RIGHT/LEFT HEART CATH AND CORONARY ANGIOGRAPHY N/A 11/24/2020    Procedure: RIGHT/LEFT HEART CATH AND CORONARY ANGIOGRAPHY;  Surgeon: Belva Crome, MD;  Location: [facility];  Service: Cardiovascular;  Laterality: N/A;   TONSILLECTOMY AND ADENOIDECTOMY   1960           Family History  Problem Relation Age of Onset   Arthritis Mother     Cancer Mother          pancreatic   Hypertension Mother     Obesity Mother     Cancer Father          prostate and colon   Colon cancer Father     Heart disease Father     Thyroid disease Father        Social History         Socioeconomic History   Marital status: Single      Spouse name: Not on file   Number of children: 1   Years of education: Not on file   Highest education level: Not on file  Occupational History   Occupation: retired  Tobacco Use   Smoking status: Former      Types: Cigarettes   Smokeless tobacco: Never  Scientific laboratory technician Use: Never used  Substance and Sexual Activity   Alcohol use: Yes      Comment: socially   Drug use: No   Sexual activity: Not on file  Other Topics Concern   Not on file  Social History Narrative   Not  on file    Social Determinants of Health       Financial Resource Strain: Medium Risk   Difficulty of Paying Living Expenses: Somewhat hard  Food Insecurity: No Food Insecurity   Worried About Charity fundraiser in the Last Year: Never true   Independence in the Last Year: Never true  Transportation Needs: No Transportation Needs   Lack of Transportation (Medical): No   Lack of Transportation (Non-Medical): No  Physical Activity: Inactive   Days of Exercise per Week: 0 days   Minutes of Exercise per Session: 0 min  Stress: No Stress Concern Present   Feeling of Stress : Not at all  Social Connections: Socially Isolated   Frequency of Communication with Friends and Family: More than three times a week   Frequency of Social Gatherings with Friends and Family: Once a week   Attends Religious Services: Never   Marine scientist or Organizations: No   Attends Archivist Meetings: Never   Marital Status: Never married  Human resources officer Violence: Not At Risk   Fear of Current or Ex-Partner: No   Emotionally Abused: No   Physically Abused: No   Sexually Abused: No             Prior to Admission medications   Medication Sig Start Date End Date Taking? Authorizing Provider  amLODipine (NORVASC) 10 MG tablet Take 1 tablet (10 mg total) by mouth daily. 12/12/20   Yes [name], [name], NP  Ascorbic Acid (VITAMIN C) 1000 MG tablet Take 1,000 mg by mouth daily.     Yes [provider]  atorvastatin (LIPITOR) 10 MG tablet Take 1 tablet (10 mg total) by mouth daily. 11/10/20   Yes Belva Crome, MD  Bayer Microlet Lancets lancets Use when testing glucose as recommended. 11/27/18   Yes [provider]  Blood Pressure Monitoring (SPHYGMOMANOMETER) MISC 1 Units by Does not apply route daily. 07/30/16   Yes [name], [name], NP  Cholecalciferol 50 MCG (2000 UT) TABS  Take 2,000 Units by mouth daily.     Yes [provider]  Coenzyme Q10 (CO Q-10)  100 MG CAPS Take 100 mg by mouth daily.      Yes [provider]  Cyanocobalamin (B-12) 500 MCG TABS Take 500 mcg by mouth daily.     Yes [provider]  glucose blood (CONTOUR NEXT TEST) test strip Test up to 4 times per day as recommended 11/27/18   Yes [provider]  hydrALAZINE (APRESOLINE) 25 MG tablet Take 1 tablet (25 mg total) by mouth 2 (two) times daily. 12/12/20   Yes [name], [name], NP  Insulin Pen Needle (BD PEN NEEDLE NANO 2ND GEN) 32G X 4 MM MISC 1 Package by Does not apply route 2 (two) times daily. 03/15/20   Yes Laqueta Linden, MD  losartan (COZAAR) 100 MG tablet Take 1 tablet (100 mg total) by mouth daily. 12/12/20   Yes [name], [name], NP  metoprolol succinate (TOPROL-XL) 25 MG 24 hr tablet Take 25 mg by mouth daily. 07/25/20   Yes [provider]  neomycin-polymyxin-hydrocortisone (CORTISPORIN) OTIC solution Apply 1-2 drops to toenail if there is soreness once daily after shower Patient taking differently: 1-2 drops See admin instructions. Apply 1-2 drops to toenail if there is soreness once daily after shower as needed 07/27/20   Yes Stover, Titorya, DPM  Zinc 50 MG CAPS Take 50 mg by mouth.     Yes [provider]            Current Outpatient Medications  Medication Sig Dispense Refill   amLODipine (NORVASC) 10 MG tablet Take 1 tablet (10 mg total) by mouth daily. 90 tablet 3   Ascorbic Acid (VITAMIN C) 1000 MG tablet Take 1,000 mg by mouth daily.       atorvastatin (LIPITOR) 10 MG tablet Take 1 tablet (10 mg total) by mouth daily. 90 tablet 3   Bayer Microlet Lancets lancets Use when testing glucose as recommended.       Blood Pressure Monitoring (SPHYGMOMANOMETER) MISC 1 Units by Does not apply route daily. 1 each 0   Cholecalciferol 50 MCG (2000 UT) TABS Take 2,000 Units by mouth daily.       Coenzyme Q10 (CO Q-10) 100 MG CAPS Take 100 mg by mouth daily.        Cyanocobalamin (B-12) 500 MCG TABS Take 500 mcg  by mouth daily.       glucose blood (CONTOUR NEXT TEST) test strip Test up to 4 times per day as recommended       hydrALAZINE (APRESOLINE) 25 MG tablet Take 1 tablet (25 mg total) by mouth 2 (two) times daily. 180 tablet 3   Insulin Pen Needle (BD PEN NEEDLE NANO 2ND GEN) 32G X 4 MM MISC 1 Package by Does not apply route 2 (two) times daily. 100 each 0   losartan (COZAAR) 100 MG tablet Take 1 tablet (100 mg total) by mouth daily. 90 tablet 3   metoprolol succinate (TOPROL-XL) 25 MG 24 hr tablet Take 25 mg by mouth daily.       neomycin-polymyxin-hydrocortisone (CORTISPORIN) OTIC solution Apply 1-2 drops to toenail if there is soreness once daily after shower (Patient taking differently: 1-2 drops See admin instructions. Apply 1-2 drops to toenail if there is soreness once daily after shower as needed) 10 mL 0   Zinc 50 MG CAPS Take 50 mg by mouth.        No current facility-administered medications for  this visit.      No Known Allergies       Review of Systems:               General:                      normal appetite, + decreased energy, no weight gain, no weight loss, no fever             Cardiac:                       no chest pain with exertion, no chest pain at rest, +SOB with mild exertion, no resting SOB, no PND, no orthopnea, + palpitations, no arrhythmia, no atrial fibrillation, no LE edema, no dizzy spells, no syncope             Respiratory:                 + exertional shortness of breath, no home oxygen, no productive cough, no dry cough, no bronchitis, no wheezing, no hemoptysis, no asthma, no pain with inspiration or cough, no sleep apnea, no CPAP at night             GI:                               no difficulty swallowing, no reflux, no frequent heartburn, no hiatal hernia, no abdominal pain, no constipation, no diarrhea, no hematochezia, no hematemesis, no melena             GU:                              no dysuria,  no frequency, no urinary tract infection, no  hematuria, no kidney stones, + chronic kidney disease             Vascular:                     no pain suggestive of claudication, no pain in feet, no leg cramps, no varicose veins, no DVT, no non-healing foot ulcer             Neuro:                         no stroke, no TIA's, no seizures, no headaches, no temporary blindness one eye,  no slurred speech, no peripheral neuropathy, no chronic pain, no instability of gait, no memory/cognitive dysfunction             Musculoskeletal:         no arthritis,  no joint swelling, no myalgias, no difficulty walking, normal mobility              Skin:                            no rash, no itching, no skin infections, no pressure sores or ulcerations             Psych:                         no anxiety, no depression, no nervousness, no unusual recent stress             Eyes:  no blurry vision, no floaters, no recent vision changes, + wears glasses or contacts             ENT:                            no hearing loss, no loose or painful teeth, no dentures, last saw dentist recently for extractions.             Hematologic:               no easy bruising, no abnormal bleeding, no clotting disorder, no frequent epistaxis             Endocrine:                   + diabetes, does check CBG's at home                            Physical Exam:               BP (!) 180/74 (BP Location: Left Arm, Patient Position: Sitting)   Pulse 91   Resp 20   Ht 5\' 10"  (1.778 m)   Wt (!) 340 lb (154.2 kg)   SpO2 94% Comment: RA  BMI 48.78 kg/m              General:                      well-appearing             HEENT:                       Unremarkable, NCAT, PERLA, EOMI             Neck:                           no JVD, no bruits, no adenopathy              Chest:                          clear to auscultation, symmetrical breath sounds, no wheezes, no rhonchi              CV:                              RRR, 3/6 systolic murmur RSB, 2/6  diastolic murmur LLSB.             Abdomen:                    soft, non-tender, no masses              Extremities:                 warm, well-perfused, pulses palpable at ankle, no lower extremity edema             Rectal/GU                   Deferred             Neuro:  Grossly non-focal and symmetrical throughout             Skin:                            Clean and dry, no rashes, no breakdown   Diagnostic Tests:   ECHOCARDIOGRAM REPORT         Patient Name:   Rose Miller Date of Exam: 11/15/2020  Medical Rec #:  [id number]      Height:       70.0 in  Accession #:    [id number]     Weight:       355.0 lb  Date of Birth:  12-Feb-1955     BSA:          2.664 m  Patient Age:    65 years       BP:           160/90 mmHg  Patient Gender: F              HR:           63 bpm.  Exam Location:  [facility]   Procedure: 2D Echo, 3D Echo, Cardiac Doppler, Color Doppler and Strain  Analysis   Indications:    I35.1 Aortic Insufficiency     History:        Patient has prior history of Echocardiogram examinations,  most                  recent 05/03/2020. Aortic Valve Disease,  Signs/Symptoms:Edema and                  Murmur; Risk Factors:Hypertension, Diabetes, Dyslipidemia,                  Family History of Coronary Artery Disease and Former  Smoker.                  Obesity, Palpitations.     Sonographer:    Deliah Boston RDCS  Referring Phys: Isaiah Serge   IMPRESSIONS     1. Left ventricular ejection fraction, by estimation, is 70 to 75%. The  left ventricle has hyperdynamic function. The left ventricle has no  regional wall motion abnormalities. Left ventricular diastolic parameters  are consistent with Grade II diastolic  dysfunction (pseudonormalization). The average left ventricular global  longitudinal strain is 22.8 %. The global longitudinal strain is normal.   2. Right ventricular systolic function is normal. The right ventricular   size is normal. There is normal pulmonary artery systolic pressure.   3. Left atrial size was moderately dilated.   4. The mitral valve is normal in structure. Mild mitral valve  regurgitation. No evidence of mitral stenosis.   5. The aortic valve is tricuspid. There is severe calcifcation of the  aortic valve. There is severe thickening of the aortic valve. Aortic valve  regurgitation is severe. Moderate to severe aortic valve stenosis. Aortic  valve mean gradient measures 28.0  mmHg. Aortic valve Vmax measures 3.59 m/s.   6. Aortic dilatation noted. There is borderline dilatation of the  ascending aorta, measuring 39 mm.   7. The inferior vena cava is normal in size with greater than 50%  respiratory variability, suggesting right atrial pressure of 3 mmHg.   Comparison(s): Aortic regurgitation has advanced.   FINDINGS   Left Ventricle: Left ventricular ejection fraction, by estimation, is 70  to 75%. The left ventricle  has hyperdynamic function. The left ventricle  has no regional wall motion abnormalities. The average left ventricular  global longitudinal strain is 22.8  %. The global longitudinal strain is normal. The left ventricular internal  cavity size was normal in size. There is no left ventricular hypertrophy.  Left ventricular diastolic parameters are consistent with Grade II  diastolic dysfunction  (pseudonormalization).   Right Ventricle: The right ventricular size is normal. No increase in  right ventricular wall thickness. Right ventricular systolic function is  normal. There is normal pulmonary artery systolic pressure. The tricuspid  regurgitant velocity is 1.95 m/s, and   with an assumed right atrial pressure of 3 mmHg, the estimated right  ventricular systolic pressure is 54.2 mmHg.   Left Atrium: Left atrial size was moderately dilated.   Right Atrium: Right atrial size was normal in size.   Pericardium: There is no evidence of pericardial effusion.    Mitral Valve: The mitral valve is normal in structure. Mild mitral valve  regurgitation. No evidence of mitral valve stenosis. MV peak gradient, 8.4  mmHg. The mean mitral valve gradient is 2.0 mmHg.   Tricuspid Valve: The tricuspid valve is normal in structure. Tricuspid  valve regurgitation is trivial. No evidence of tricuspid stenosis.   Aortic Valve: The aortic valve is tricuspid. There is severe calcifcation  of the aortic valve. There is severe thickening of the aortic valve.  Aortic valve regurgitation is severe. Aortic regurgitation PHT measures  603 msec. Moderate to severe aortic  stenosis is present. Aortic valve mean gradient measures 28.0 mmHg. Aortic  valve peak gradient measures 51.7 mmHg. Aortic valve area, by VTI measures  1.76 cm.   Pulmonic Valve: The pulmonic valve was normal in structure. Pulmonic valve  regurgitation is not visualized. No evidence of pulmonic stenosis.   Aorta: Aortic dilatation noted. There is borderline dilatation of the  ascending aorta, measuring 39 mm.   Venous: The inferior vena cava is normal in size with greater than 50%  respiratory variability, suggesting right atrial pressure of 3 mmHg.   IAS/Shunts: No atrial level shunt detected by color flow Doppler.      LEFT VENTRICLE  PLAX 2D  LVIDd:         5.50 cm   Diastology  LVIDs:         2.70 cm   LV e' medial:    7.94 cm/s  LV PW:         1.30 cm   LV E/e' medial:  19.9  LV IVS:        1.10 cm   LV e' lateral:   7.18 cm/s  LVOT diam:     2.40 cm   LV E/e' lateral: 22.0  LV SV:         159  LV SV Index:   60        2D Longitudinal Strain  LVOT Area:     4.52 cm  2D Strain GLS (A2C):   21.5 %                           2D Strain GLS (A3C):   23.2 %                           2D Strain GLS (A4C):   23.7 %  2D Strain GLS Avg:     22.8 %                              3D Volume EF:                           3D EF:        63 %                           LV  EDV:       168 ml                           LV ESV:       62 ml                           LV SV:        105 ml   RIGHT VENTRICLE  RV S prime:     12.90 cm/s  TAPSE (M-mode): 2.2 cm   LEFT ATRIUM              Index        RIGHT ATRIUM           Index  LA diam:        4.40 cm  1.65 cm/m   RA Area:     14.00 cm  LA Vol (A2C):   91.2 ml  34.24 ml/m  RA Volume:   34.10 ml  12.80 ml/m  LA Vol (A4C):   111.0 ml 41.67 ml/m  LA Biplane Vol: 107.0 ml 40.17 ml/m   AORTIC VALVE  AV Area (Vmax):    1.73 cm  AV Area (Vmean):   1.79 cm  AV Area (VTI):     1.76 cm  AV Vmax:           359.40 cm/s  AV Vmean:          244.200 cm/s  AV VTI:            0.905 m  AV Peak Grad:      51.7 mmHg  AV Mean Grad:      28.0 mmHg  LVOT Vmax:         137.50 cm/s  LVOT Vmean:        96.550 cm/s  LVOT VTI:          0.352 m  LVOT/AV VTI ratio: 0.39  AI PHT:            603 msec     AORTA  Ao Root diam: 3.70 cm  Ao Asc diam:  3.90 cm   MITRAL VALVE                TRICUSPID VALVE  MV Area (PHT): cm          TR Peak grad:   15.2 mmHg  MV Area VTI:   2.97 cm     TR Vmax:        195.00 cm/s  MV Peak grad:  8.4 mmHg  MV Mean grad:  2.0 mmHg     SHUNTS  MV Vmax:       1.45 m/s     Systemic VTI:  0.35 m  MV Vmean:      64.0 cm/s  Systemic Diam: 2.40 cm  MV Decel Time: 186 msec  MV E velocity: 157.75 cm/s  MV A velocity: 55.08 cm/s  MV E/A ratio:  2.86   Candee Furbish MD  Electronically signed by Candee Furbish MD  Signature Date/Time: 11/15/2020/11:37:12 AM         Final       Physicians   Panel Physicians Referring Physician Case Authorizing Physician  Belva Crome, MD (Primary)        Procedures   RIGHT/LEFT HEART CATH AND CORONARY ANGIOGRAPHY    Conclusion   Mildly dilated ascending aorta. Moderately severe aortic regurgitation.  Mild aortic stenosis. Moderately severe elevation in left ventricular end-diastolic pressure, 32 mmHg. Elevated mean pulmonary wedge pressure, 24 mmHg.  V  wave 40 mmHg. Mild pulmonary hypertension with PA mean 31 mmHg and phasic pressure 54 over 17 mmHg. Widely patent coronary arteries without anomalous origin.   RECOMMENDATION:   Better blood pressure control. Surgical consultation for comanagement.  Patient is not limited by symptoms but is quite sedentary. Echocardiography suggests severe aortic regurgitation although this is not borne out by angiography.   Recommendations   Antiplatelet/Anticoag No indication for antiplatelet therapy at this time .    Surgeon Notes       11/24/2020  1:30 PM CV Procedure signed by Belva Crome, MD    Procedural Details   Technical Details The right radial area was sterilely prepped and draped. Intravenous sedation with Versed and fentanyl was administered. 1% Xylocaine was infiltrated to achieve local analgesia. Using real-time vascular ultrasound, a double wall stick with an angiocath was utilized to obtain intra-arterial access. A VUS image was saved for the permanent record.The modified Seldinger technique was used to place a 59F " Slender" sheath in the right radial artery. Weight based heparin was administered. Coronary angiography was done using 5 F catheters. Right coronary angiography was performed with a JR4. Left ventricular hemodymic recordings and angiography was done using the JR 4 catheter and hand injection. Left coronary angiography was performed with a JL 3.5 cm.  A 5 French angled pigtail catheter was used to perform supravalvular aortic angiography using 16 cc of contrast for a total of 45 cc.  Moderate to severe aortic regurgitation was identified based upon contrast near the LV apex after the first beat following root injection.  Right heart catheterization was performed by exchanging a previously placed antecubital IV angio-cath for a 5 French Slender sheath. 1% Xylocaine was used to locally anesthetize the area around the IV site. The IV catheter was wired using an .018 guidewire. The  modified Seldinger technique was used to place the 5 [place] sheath. Double glove technique was used to enhance sterility. After sheath insertion, right heart cath was performed using a 5 French balloon tipped catheter and fluoroscopic guidance. Pressures were recorded in each chamber and in the pulmonary capillary wedge position.. The main pulmonary artery O2 saturation was sampled.   Hemostasis was achieved using a pneumatic band.  During this procedure the patient is administered a total of Versed 1.5 mg and Fentanyl 75 mcg to achieve and maintain moderate conscious sedation.  The patient's heart rate, blood pressure, and oxygen saturation are monitored continuously during the procedure. The period of conscious sedation is 44 minutes, of which I was present face-to-face 100% of this time.  Estimated blood loss <50 mL.   During this procedure medications were administered to achieve and maintain moderate conscious sedation while the patient's heart rate, blood pressure, and  oxygen saturation were continuously monitored and I was present face-to-face 100% of this time.    Medications (Filter: Administrations occurring from 1152 to 1319 on 11/24/20)  important  Continuous medications are totaled by the amount administered until 11/24/20 1319.    Heparin (Porcine) in NaCl 1000-0.9 UT/500ML-% SOLN (mL) Total volume:  1,000 mL Date/Time Rate/Dose/Volume Action    11/24/20 1210 500 mL Given    1211 500 mL Given      midazolam (VERSED) injection (mg) Total dose:  1.5 mg Date/Time Rate/Dose/Volume Action    11/24/20 1215 1 mg Given    1223 0.5 mg Given      fentaNYL (SUBLIMAZE) injection (mcg) Total dose:  75 mcg Date/Time Rate/Dose/Volume Action    11/24/20 1216 50 mcg Given    1223 25 mcg Given      lidocaine (PF) (XYLOCAINE) 1 % injection (mL) Total volume:  4 mL Date/Time Rate/Dose/Volume Action    11/24/20 1223 2 mL Given    1223 2 mL Given      heparin sodium (porcine) injection  (Units) Total dose:  6,500 Units Date/Time Rate/Dose/Volume Action    11/24/20 1234 6,500 Units Given      iohexol (OMNIPAQUE) 350 MG/ML injection (mL) Total volume:  120 mL Date/Time Rate/Dose/Volume Action    11/24/20 1307 120 mL Given      Sedation Time   Sedation Time Physician-1: 44 minutes 8 seconds Contrast   Medication Name Total Dose  iohexol (OMNIPAQUE) 350 MG/ML injection 120 mL    Radiation/Fluoro   Fluoro time: 9.3 (min) DAP: 16010 (mGycm2) Cumulative Air Kerma: 9323 (mGy) Coronary Findings   Diagnostic Dominance: Right Right Coronary Artery  Ost RCA lesion is 25% stenosed.    Intervention    No interventions have been documented.    Right Heart   Right Heart Pressures Hemodynamic findings consistent with mild pulmonary hypertension. Elevated LV EDP consistent with volume overload.  Right Atrium Right atrial pressure is normal.    Left Heart   Left Ventricle The left ventricular systolic function is normal. LV end diastolic pressure is moderately elevated. The left ventricular ejection fraction is 50-55% by visual estimate. No regional wall motion abnormalities.  Aortic Valve There is mild aortic valve stenosis. There is moderate (3+) aortic regurgitation. The aortic valve is calcified.  Aorta Aortic Root:  The aortic root displays dilation. There is moderate (3+) aortic regurgitation. There is a mild gradient across the aortic valve.    Coronary Diagrams   Diagnostic Dominance: Right Intervention   Implants      No implant documentation for this case.    [employer] Images    Show images for CARDIAC CATHETERIZATION Images on Long Term Storage    Show images for Jen, Benedict to Procedure Log   Procedure Log    Hemo Data   Flowsheet Row Most Recent Value  Fick Cardiac Output 8.01 L/min  Fick Cardiac Output Index 3.04 (L/min)/BSA  RA A Wave 12 mmHg  RA V Wave 6 mmHg  RA Mean 5 mmHg  RV Systolic Pressure 59 mmHg  RV Diastolic  Pressure 6 mmHg  RV EDP 11 mmHg  PA Systolic Pressure 54 mmHg  PA Diastolic Pressure 17 mmHg  PA Mean 31 mmHg  PW A Wave 29 mmHg  PW V Wave 39 mmHg  PW Mean 24 mmHg  AO Systolic Pressure 557 mmHg  AO Diastolic Pressure 60 mmHg  AO Mean 91 mmHg  LV Systolic Pressure 322 mmHg  LV Diastolic  Pressure 7 mmHg  LV EDP 34 mmHg  AOp Systolic Pressure 761 mmHg  AOp Diastolic Pressure 61 mmHg  AOp Mean Pressure 90 mmHg  LVp Systolic Pressure 950 mmHg  LVp Diastolic Pressure 4 mmHg  LVp EDP Pressure 30 mmHg  QP/QS 1  TPVR Index 10.2 HRUI  TSVR Index 29.94 HRUI  PVR SVR Ratio 0.08  TPVR/TSVR Ratio 0.34    STS Risk of Mortality : 3.2%     Impression:   This 65 year old woman has stage D, severe, symptomatic aortic insufficiency and moderate aortic stenosis with New York Heart Association class II-III symptoms of exertional shortness of breath and fatigue consistent with chronic diastolic congestive heart failure.  I personally reviewed her 2D echocardiogram, cardiac catheterization, and CTA studies.  Her echocardiogram shows a trileaflet aortic valve with severe calcification and thickening and restricted leaflet mobility.  The mean gradient is 28 mmHg with a peak gradient of 52 mmHg consistent with moderate aortic stenosis.  Visually there appears to be severe aortic insufficiency although the AI pressure half-time is only 603 ms.  Her left ventricular ejection fraction is normal.  LV diastolic dimension has increased from 4.7 cm in April to 5.5 cm in November.  LV systolic dimension has remained stable.  Cardiac catheterization showed no significant coronary disease.  There was mild pulmonary hypertension with an elevated LVEDP consistent with volume overload.  There was felt to be 3+ aortic insufficiency.  I think her aortic valve is severely diseased with at least moderate stenosis and severe insufficiency with signs of left ventricular overload.  I think it would be best to proceed with aortic  valve replacement before she develops progressive left ventricular dilation and dysfunction.  She is 65 years old but with morbid obesity with a weight of 340 pounds, diabetes and stage III chronic kidney disease, and CO2 retention on arterial blood gas suggesting obesity hypoventilation syndrome I think it is reasonable to recommend TAVR as an alternative to open surgical aortic valve replacement.  Her gated cardiac CTA shows anatomy suitable for TAVR using a SAPIEN 3 valve.  Her abdominal and pelvic CTA shows adequate pelvic vascular anatomy to allow transfemoral insertion.  I discussed the alternative of open surgical aortic valve replacement and she is really not interested in that unless that is the only option available.  The patient and her sister were counseled at length regarding treatment alternatives for management of severe symptomatic aortic stenosis. The risks and benefits of surgical intervention has been discussed in detail. Long-term prognosis with medical therapy was discussed. Alternative approaches such as conventional surgical aortic valve replacement, transcatheter aortic valve replacement, and palliative medical therapy were compared and contrasted at length. This discussion was placed in the context of the patient's own specific clinical presentation and past medical history. All of their questions have been addressed.   Following the decision to proceed with transcatheter aortic valve replacement, a discussion was held regarding what types of management strategies would be attempted intraoperatively in the event of life-threatening complications, including whether or not the patient would be considered a candidate for the use of cardiopulmonary bypass and/or conversion to open sternotomy for attempted surgical intervention. I think she would be a candidate for emergent sternotomy to manage any intraoperative complications. The patient is aware of the fact that transient use of  cardiopulmonary bypass may be necessary. The patient has been advised of a variety of complications that might develop including but not limited to risks of death, stroke, paravalvular leak,  aortic dissection or other major vascular complications, aortic annulus rupture, device embolization, cardiac rupture or perforation, mitral regurgitation, acute myocardial infarction, arrhythmia, heart block or bradycardia requiring permanent pacemaker placement, congestive heart failure, respiratory failure, renal failure, pneumonia, infection, other late complications related to structural valve deterioration or migration, or other complications that might ultimately cause a temporary or permanent loss of functional independence or other long term morbidity. The patient provides full informed consent for the procedure as described and all questions were answered.      Plan:   She will be scheduled for transfemoral TAVR on 12/26/2020.   I spent 60 minutes performing this consultation and > 50% of this time was spent face to face counseling and coordinating the care of this patient's severe aortic insufficiency and moderate aortic stenosis.       Gaye Pollack, MD 12/18/2020 4:04 PM

## 2020-12-18 NOTE — Progress Notes (Signed)
Patient ID: Rose Miller, female   DOB: 1955/04/01, 65 y.o.   MRN: [id number]  [facility]IC         [place]       [place] [address]             [phone]          CARDIOTHORACIC SURGERY CONSULTATION REPORT  PCP is [name], [name], NP Referring Provider is Lenna Sciara, MD Primary Cardiologist is None  Reason for consultation:  Moderate aortic stenosis and severe aortic insufficiency  HPI:  The patient is a 65 year old woman with a history of morbid obesity, hypertension, hyperlipidemia, type 2 diabetes, and stage III chronic kidney disease who presents with a several month history of exertional shortness of breath and fatigue.  She reports having shortness of breath with walking around in stores and has some shortness of breath walking in the office this morning.  She denies any chest pain or pressure.  She has had no dizziness or syncope. She has no lower extremity edema.  She denies orthopnea and PND.  A 2D echocardiogram on 11/15/2020 showed a trileaflet aortic valve with severe calcification and thickening.  The mean gradient was 28 mmHg with a peak gradient of 52 mmHg.  Aortic pressure half-time was measured at 603 ms although visually the aortic valve regurgitation is severe.  There is no mitral stenosis and only mild mitral regurgitation.  Left ventricular ejection fraction is 70 to 75%.  There is grade 2 diastolic dysfunction.  Cardiac catheterization showed no significant coronary disease.  PA pressure was elevated at 54/17 with a mean of 31.  Mean pulmonary capillary wedge pressure was 24.  LVEDP was 34.  Cardiac index was 3.0.  Mean right atrial pressure was 5.  The patient is retired from the post office.  She is single and has 1 adult child.  She is here today with her sister.  She has a fairly sedentary lifestyle and does not exercise. Past Medical History:  Diagnosis Date   Adhesive  capsulitis    Allergy    Aortic valve stenosis    Arthritis    Asthma    Back pain    Bilateral swelling of feet    CKD (chronic kidney disease) stage 3, GFR 30-59 ml/min (HCC)    Constipation    Diabetes mellitus without complication (HCC)    Diuretic-induced hypokalemia    Heart murmur    Heartburn    Hypercholesterolemia    Hypertension    IT band syndrome    Joint pain    Obesity    Palpitations    Periumbilical pain    Postmenopausal estrogen deficiency    Prediabetes    Shoulder pain, right    Swallowing difficulty    Ventral hernia     Past Surgical History:  Procedure Laterality Date   CESAREAN SECTION  1989   COLONOSCOPY     RIGHT/LEFT HEART CATH AND CORONARY ANGIOGRAPHY N/A 11/24/2020   Procedure: RIGHT/LEFT HEART CATH AND CORONARY ANGIOGRAPHY;  Surgeon: Belva Crome, MD;  Location: [facility];  Service: Cardiovascular;  Laterality: N/A;   TONSILLECTOMY AND ADENOIDECTOMY  1960    Family History  Problem Relation Age of Onset   Arthritis Mother    Cancer Mother        pancreatic   Hypertension Mother    Obesity Mother    Cancer Father  prostate and colon   Colon cancer Father    Heart disease Father    Thyroid disease Father     Social History   Socioeconomic History   Marital status: Single    Spouse name: Not on file   Number of children: 1   Years of education: Not on file   Highest education level: Not on file  Occupational History   Occupation: retired  Tobacco Use   Smoking status: Former    Types: Cigarettes   Smokeless tobacco: Never  Scientific laboratory technician Use: Never used  Substance and Sexual Activity   Alcohol use: Yes    Comment: socially   Drug use: No   Sexual activity: Not on file  Other Topics Concern   Not on file  Social History Narrative   Not on file   Social Determinants of Health   Financial Resource Strain: Medium Risk   Difficulty of Paying Living Expenses: Somewhat hard  Food Insecurity: No  Food Insecurity   Worried About Charity fundraiser in the Last Year: Never true   [place] in the Last Year: Never true  Transportation Needs: No Transportation Needs   Lack of Transportation (Medical): No   Lack of Transportation (Non-Medical): No  Physical Activity: Inactive   Days of Exercise per Week: 0 days   Minutes of Exercise per Session: 0 min  Stress: No Stress Concern Present   Feeling of Stress : Not at all  Social Connections: Socially Isolated   Frequency of Communication with Friends and Family: More than three times a week   Frequency of Social Gatherings with Friends and Family: Once a week   Attends Religious Services: Never   Marine scientist or Organizations: No   Attends Music therapist: Never   Marital Status: Never married  Human resources officer Violence: Not At Risk   Fear of Current or Ex-Partner: No   Emotionally Abused: No   Physically Abused: No   Sexually Abused: No    Prior to Admission medications   Medication Sig Start Date End Date Taking? Authorizing Provider  amLODipine (NORVASC) 10 MG tablet Take 1 tablet (10 mg total) by mouth daily. 12/12/20  Yes [name], [name], NP  Ascorbic Acid (VITAMIN C) 1000 MG tablet Take 1,000 mg by mouth daily.   Yes [provider]  atorvastatin (LIPITOR) 10 MG tablet Take 1 tablet (10 mg total) by mouth daily. 11/10/20  Yes Belva Crome, MD  Bayer Microlet Lancets lancets Use when testing glucose as recommended. 11/27/18  Yes [provider]  Blood Pressure Monitoring (SPHYGMOMANOMETER) MISC 1 Units by Does not apply route daily. 07/30/16  Yes [name], [name], NP  Cholecalciferol 50 MCG (2000 UT) TABS Take 2,000 Units by mouth daily.   Yes [provider]  Coenzyme Q10 (CO Q-10) 100 MG CAPS Take 100 mg by mouth daily.    Yes [provider]  Cyanocobalamin (B-12) 500 MCG TABS Take 500 mcg by mouth daily.   Yes [provider]  glucose blood  (CONTOUR NEXT TEST) test strip Test up to 4 times per day as recommended 11/27/18  Yes [provider]  hydrALAZINE (APRESOLINE) 25 MG tablet Take 1 tablet (25 mg total) by mouth 2 (two) times daily. 12/12/20  Yes [name], [name], NP  Insulin Pen Needle (BD PEN NEEDLE NANO 2ND GEN) 32G X 4 MM MISC 1 Package by Does not apply route 2 (two) times  daily. 03/15/20  Yes Laqueta Linden, MD  losartan (COZAAR) 100 MG tablet Take 1 tablet (100 mg total) by mouth daily. 12/12/20  Yes [name], [name], NP  metoprolol succinate (TOPROL-XL) 25 MG 24 hr tablet Take 25 mg by mouth daily. 07/25/20  Yes [provider]  neomycin-polymyxin-hydrocortisone (CORTISPORIN) OTIC solution Apply 1-2 drops to toenail if there is soreness once daily after shower Patient taking differently: 1-2 drops See admin instructions. Apply 1-2 drops to toenail if there is soreness once daily after shower as needed 07/27/20  Yes Stover, Titorya, DPM  Zinc 50 MG CAPS Take 50 mg by mouth.   Yes [provider]    Current Outpatient Medications  Medication Sig Dispense Refill   amLODipine (NORVASC) 10 MG tablet Take 1 tablet (10 mg total) by mouth daily. 90 tablet 3   Ascorbic Acid (VITAMIN C) 1000 MG tablet Take 1,000 mg by mouth daily.     atorvastatin (LIPITOR) 10 MG tablet Take 1 tablet (10 mg total) by mouth daily. 90 tablet 3   Bayer Microlet Lancets lancets Use when testing glucose as recommended.     Blood Pressure Monitoring (SPHYGMOMANOMETER) MISC 1 Units by Does not apply route daily. 1 each 0   Cholecalciferol 50 MCG (2000 UT) TABS Take 2,000 Units by mouth daily.     Coenzyme Q10 (CO Q-10) 100 MG CAPS Take 100 mg by mouth daily.      Cyanocobalamin (B-12) 500 MCG TABS Take 500 mcg by mouth daily.     glucose blood (CONTOUR NEXT TEST) test strip Test up to 4 times per day as recommended     hydrALAZINE (APRESOLINE) 25 MG tablet Take 1 tablet (25 mg total) by mouth 2 (two) times daily. 180  tablet 3   Insulin Pen Needle (BD PEN NEEDLE NANO 2ND GEN) 32G X 4 MM MISC 1 Package by Does not apply route 2 (two) times daily. 100 each 0   losartan (COZAAR) 100 MG tablet Take 1 tablet (100 mg total) by mouth daily. 90 tablet 3   metoprolol succinate (TOPROL-XL) 25 MG 24 hr tablet Take 25 mg by mouth daily.     neomycin-polymyxin-hydrocortisone (CORTISPORIN) OTIC solution Apply 1-2 drops to toenail if there is soreness once daily after shower (Patient taking differently: 1-2 drops See admin instructions. Apply 1-2 drops to toenail if there is soreness once daily after shower as needed) 10 mL 0   Zinc 50 MG CAPS Take 50 mg by mouth.     No current facility-administered medications for this visit.    No Known Allergies    Review of Systems:   General:  normal appetite, + decreased energy, no weight gain, no weight loss, no fever  Cardiac:  no chest pain with exertion, no chest pain at rest, +SOB with mild exertion, no resting SOB, no PND, no orthopnea, + palpitations, no arrhythmia, no atrial fibrillation, no LE edema, no dizzy spells, no syncope  Respiratory:  + exertional shortness of breath, no home oxygen, no productive cough, no dry cough, no bronchitis, no wheezing, no hemoptysis, no asthma, no pain with inspiration or cough, no sleep apnea, no CPAP at night  GI:   no difficulty swallowing, no reflux, no frequent heartburn, no hiatal hernia, no abdominal pain, no constipation, no diarrhea, no hematochezia, no hematemesis, no melena  GU:   no dysuria,  no frequency, no urinary tract infection, no hematuria, no kidney stones, + chronic kidney disease  Vascular:  no pain suggestive  of claudication, no pain in feet, no leg cramps, no varicose veins, no DVT, no non-healing foot ulcer  Neuro:   no stroke, no TIA's, no seizures, no headaches, no temporary blindness one eye,  no slurred speech, no peripheral neuropathy, no chronic pain, no instability of gait, no memory/cognitive  dysfunction  Musculoskeletal: no arthritis,  no joint swelling, no myalgias, no difficulty walking, normal mobility   Skin:   no rash, no itching, no skin infections, no pressure sores or ulcerations  Psych:   no anxiety, no depression, no nervousness, no unusual recent stress  Eyes:   no blurry vision, no floaters, no recent vision changes, + wears glasses or contacts  ENT:   no hearing loss, no loose or painful teeth, no dentures, last saw dentist recently for extractions.  Hematologic:  no easy bruising, no abnormal bleeding, no clotting disorder, no frequent epistaxis  Endocrine:  + diabetes, does check CBG's at home     Physical Exam:   BP (!) 180/74 (BP Location: Left Arm, Patient Position: Sitting)   Pulse 91   Resp 20   Ht 5\' 10"  (1.778 m)   Wt (!) 340 lb (154.2 kg)   SpO2 94% Comment: RA  BMI 48.78 kg/m   General:  well-appearing  HEENT:  Unremarkable, NCAT, PERLA, EOMI  Neck:   no JVD, no bruits, no adenopathy   Chest:   clear to auscultation, symmetrical breath sounds, no wheezes, no rhonchi   CV:   RRR, 3/6 systolic murmur RSB, 2/6 diastolic murmur LLSB.  Abdomen:  soft, non-tender, no masses   Extremities:  warm, well-perfused, pulses palpable at ankle, no lower extremity edema  Rectal/GU  Deferred  Neuro:   Grossly non-focal and symmetrical throughout  Skin:   Clean and dry, no rashes, no breakdown  Diagnostic Tests:  ECHOCARDIOGRAM REPORT         Patient Name:   Rose Miller Date of Exam: 11/15/2020  Medical Rec #:  [id number]      Height:       70.0 in  Accession #:    [id number]     Weight:       355.0 lb  Date of Birth:  [birth year]     BSA:          2.664 m  Patient Age:    1 years       BP:           160/90 mmHg  Patient Gender: F              HR:           63 bpm.  Exam Location:  [facility]   Procedure: 2D Echo, 3D Echo, Cardiac Doppler, Color Doppler and Strain  Analysis   Indications:    I35.1 Aortic Insufficiency     History:         Patient has prior history of Echocardiogram examinations,  most                  recent 05/03/2020. Aortic Valve Disease,  Signs/Symptoms:Edema and                  Murmur; Risk Factors:Hypertension, Diabetes, Dyslipidemia,                  Family History of Coronary Artery Disease and Former  Smoker.                  Obesity, Palpitations.  Sonographer:    Deliah Boston RDCS  Referring Phys: Isaiah Serge   IMPRESSIONS     1. Left ventricular ejection fraction, by estimation, is 70 to 75%. The  left ventricle has hyperdynamic function. The left ventricle has no  regional wall motion abnormalities. Left ventricular diastolic parameters  are consistent with Grade II diastolic  dysfunction (pseudonormalization). The average left ventricular global  longitudinal strain is 22.8 %. The global longitudinal strain is normal.   2. Right ventricular systolic function is normal. The right ventricular  size is normal. There is normal pulmonary artery systolic pressure.   3. Left atrial size was moderately dilated.   4. The mitral valve is normal in structure. Mild mitral valve  regurgitation. No evidence of mitral stenosis.   5. The aortic valve is tricuspid. There is severe calcifcation of the  aortic valve. There is severe thickening of the aortic valve. Aortic valve  regurgitation is severe. Moderate to severe aortic valve stenosis. Aortic  valve mean gradient measures 28.0  mmHg. Aortic valve Vmax measures 3.59 m/s.   6. Aortic dilatation noted. There is borderline dilatation of the  ascending aorta, measuring 39 mm.   7. The inferior vena cava is normal in size with greater than 50%  respiratory variability, suggesting right atrial pressure of 3 mmHg.   Comparison(s): Aortic regurgitation has advanced.   FINDINGS   Left Ventricle: Left ventricular ejection fraction, by estimation, is 70  to 75%. The left ventricle has hyperdynamic function. The left ventricle  has no regional  wall motion abnormalities. The average left ventricular  global longitudinal strain is 22.8  %. The global longitudinal strain is normal. The left ventricular internal  cavity size was normal in size. There is no left ventricular hypertrophy.  Left ventricular diastolic parameters are consistent with Grade II  diastolic dysfunction  (pseudonormalization).   Right Ventricle: The right ventricular size is normal. No increase in  right ventricular wall thickness. Right ventricular systolic function is  normal. There is normal pulmonary artery systolic pressure. The tricuspid  regurgitant velocity is 1.95 m/s, and   with an assumed right atrial pressure of 3 mmHg, the estimated right  ventricular systolic pressure is 93.7 mmHg.   Left Atrium: Left atrial size was moderately dilated.   Right Atrium: Right atrial size was normal in size.   Pericardium: There is no evidence of pericardial effusion.   Mitral Valve: The mitral valve is normal in structure. Mild mitral valve  regurgitation. No evidence of mitral valve stenosis. MV peak gradient, 8.4  mmHg. The mean mitral valve gradient is 2.0 mmHg.   Tricuspid Valve: The tricuspid valve is normal in structure. Tricuspid  valve regurgitation is trivial. No evidence of tricuspid stenosis.   Aortic Valve: The aortic valve is tricuspid. There is severe calcifcation  of the aortic valve. There is severe thickening of the aortic valve.  Aortic valve regurgitation is severe. Aortic regurgitation PHT measures  603 msec. Moderate to severe aortic  stenosis is present. Aortic valve mean gradient measures 28.0 mmHg. Aortic  valve peak gradient measures 51.7 mmHg. Aortic valve area, by VTI measures  1.76 cm.   Pulmonic Valve: The pulmonic valve was normal in structure. Pulmonic valve  regurgitation is not visualized. No evidence of pulmonic stenosis.   Aorta: Aortic dilatation noted. There is borderline dilatation of the  ascending aorta,  measuring 39 mm.   Venous: The inferior vena cava is normal in size with greater than 50%  respiratory variability,  suggesting right atrial pressure of 3 mmHg.   IAS/Shunts: No atrial level shunt detected by color flow Doppler.      LEFT VENTRICLE  PLAX 2D  LVIDd:         5.50 cm   Diastology  LVIDs:         2.70 cm   LV e' medial:    7.94 cm/s  LV PW:         1.30 cm   LV E/e' medial:  19.9  LV IVS:        1.10 cm   LV e' lateral:   7.18 cm/s  LVOT diam:     2.40 cm   LV E/e' lateral: 22.0  LV SV:         159  LV SV Index:   60        2D Longitudinal Strain  LVOT Area:     4.52 cm  2D Strain GLS (A2C):   21.5 %                           2D Strain GLS (A3C):   23.2 %                           2D Strain GLS (A4C):   23.7 %                           2D Strain GLS Avg:     22.8 %                              3D Volume EF:                           3D EF:        63 %                           LV EDV:       168 ml                           LV ESV:       62 ml                           LV SV:        105 ml   RIGHT VENTRICLE  RV S prime:     12.90 cm/s  TAPSE (M-mode): 2.2 cm   LEFT ATRIUM              Index        RIGHT ATRIUM           Index  LA diam:        4.40 cm  1.65 cm/m   RA Area:     14.00 cm  LA Vol (A2C):   91.2 ml  34.24 ml/m  RA Volume:   34.10 ml  12.80 ml/m  LA Vol (A4C):   111.0 ml 41.67 ml/m  LA Biplane Vol: 107.0 ml 40.17 ml/m   AORTIC VALVE  AV Area (Vmax):    1.73 cm  AV Area (Vmean):   1.79 cm  AV Area (VTI):  1.76 cm  AV Vmax:           359.40 cm/s  AV Vmean:          244.200 cm/s  AV VTI:            0.905 m  AV Peak Grad:      51.7 mmHg  AV Mean Grad:      28.0 mmHg  LVOT Vmax:         137.50 cm/s  LVOT Vmean:        96.550 cm/s  LVOT VTI:          0.352 m  LVOT/AV VTI ratio: 0.39  AI PHT:            603 msec     AORTA  Ao Root diam: 3.70 cm  Ao Asc diam:  3.90 cm   MITRAL VALVE                TRICUSPID VALVE  MV Area (PHT): cm           TR Peak grad:   15.2 mmHg  MV Area VTI:   2.97 cm     TR Vmax:        195.00 cm/s  MV Peak grad:  8.4 mmHg  MV Mean grad:  2.0 mmHg     SHUNTS  MV Vmax:       1.45 m/s     Systemic VTI:  0.35 m  MV Vmean:      64.0 cm/s    Systemic Diam: 2.40 cm  MV Decel Time: 186 msec  MV E velocity: 157.75 cm/s  MV A velocity: 55.08 cm/s  MV E/A ratio:  2.86   Candee Furbish MD  Electronically signed by Candee Furbish MD  Signature Date/Time: 11/15/2020/11:37:12 AM         Final     Physicians  Panel Physicians Referring Physician Case Authorizing Physician  Belva Crome, MD (Primary)     Procedures  RIGHT/LEFT HEART CATH AND CORONARY ANGIOGRAPHY   Conclusion  Mildly dilated ascending aorta. Moderately severe aortic regurgitation.  Mild aortic stenosis. Moderately severe elevation in left ventricular end-diastolic pressure, 32 mmHg. Elevated mean pulmonary wedge pressure, 24 mmHg.  V wave 40 mmHg. Mild pulmonary hypertension with PA mean 31 mmHg and phasic pressure 54 over 17 mmHg. Widely patent coronary arteries without anomalous origin.   RECOMMENDATION:   Better blood pressure control. Surgical consultation for comanagement.  Patient is not limited by symptoms but is quite sedentary. Echocardiography suggests severe aortic regurgitation although this is not borne out by angiography.   Recommendations  Antiplatelet/Anticoag No indication for antiplatelet therapy at this time .   Surgeon Notes    11/24/2020  1:30 PM CV Procedure signed by Belva Crome, MD   Procedural Details  Technical Details The right radial area was sterilely prepped and draped. Intravenous sedation with Versed and fentanyl was administered. 1% Xylocaine was infiltrated to achieve local analgesia. Using real-time vascular ultrasound, a double wall stick with an angiocath was utilized to obtain intra-arterial access. A VUS image was saved for the permanent record.The modified Seldinger technique was  used to place a 72F " Slender" sheath in the right radial artery. Weight based heparin was administered. Coronary angiography was done using 5 F catheters. Right coronary angiography was performed with a JR4. Left ventricular hemodymic recordings and angiography was done using the JR 4 catheter and hand injection. Left coronary angiography was performed with a  JL 3.5 cm.  A 5 French angled pigtail catheter was used to perform supravalvular aortic angiography using 16 cc of contrast for a total of 45 cc.  Moderate to severe aortic regurgitation was identified based upon contrast near the LV apex after the first beat following root injection.  Right heart catheterization was performed by exchanging a previously placed antecubital IV angio-cath for a 5 French Slender sheath. 1% Xylocaine was used to locally anesthetize the area around the IV site. The IV catheter was wired using an .018 guidewire. The modified Seldinger technique was used to place the 5 [place] sheath. Double glove technique was used to enhance sterility. After sheath insertion, right heart cath was performed using a 5 French balloon tipped catheter and fluoroscopic guidance. Pressures were recorded in each chamber and in the pulmonary capillary wedge position.. The main pulmonary artery O2 saturation was sampled.   Hemostasis was achieved using a pneumatic band.  During this procedure the patient is administered a total of Versed 1.5 mg and Fentanyl 75 mcg to achieve and maintain moderate conscious sedation.  The patient's heart rate, blood pressure, and oxygen saturation are monitored continuously during the procedure. The period of conscious sedation is 44 minutes, of which I was present face-to-face 100% of this time.  Estimated blood loss <50 mL.   During this procedure medications were administered to achieve and maintain moderate conscious sedation while the patient's heart rate, blood pressure, and oxygen saturation were continuously  monitored and I was present face-to-face 100% of this time.   Medications (Filter: Administrations occurring from 1152 to 1319 on 11/24/20)  important  Continuous medications are totaled by the amount administered until 11/24/20 1319.   Heparin (Porcine) in NaCl 1000-0.9 UT/500ML-% SOLN (mL) Total volume:  1,000 mL Date/Time Rate/Dose/Volume Action   11/24/20 1210 500 mL Given   1211 500 mL Given    midazolam (VERSED) injection (mg) Total dose:  1.5 mg Date/Time Rate/Dose/Volume Action   11/24/20 1215 1 mg Given   1223 0.5 mg Given    fentaNYL (SUBLIMAZE) injection (mcg) Total dose:  75 mcg Date/Time Rate/Dose/Volume Action   11/24/20 1216 50 mcg Given   1223 25 mcg Given    lidocaine (PF) (XYLOCAINE) 1 % injection (mL) Total volume:  4 mL Date/Time Rate/Dose/Volume Action   11/24/20 1223 2 mL Given   1223 2 mL Given    heparin sodium (porcine) injection (Units) Total dose:  6,500 Units Date/Time Rate/Dose/Volume Action   11/24/20 1234 6,500 Units Given    iohexol (OMNIPAQUE) 350 MG/ML injection (mL) Total volume:  120 mL Date/Time Rate/Dose/Volume Action   11/24/20 1307 120 mL Given    Sedation Time  Sedation Time Physician-1: 44 minutes 8 seconds Contrast  Medication Name Total Dose  iohexol (OMNIPAQUE) 350 MG/ML injection 120 mL   Radiation/Fluoro  Fluoro time: 9.3 (min) DAP: 93235 (mGycm2) Cumulative Air Kerma: 5732 (mGy) Coronary Findings  Diagnostic Dominance: Right Right Coronary Artery  Ost RCA lesion is 25% stenosed.    Intervention   No interventions have been documented.   Right Heart  Right Heart Pressures Hemodynamic findings consistent with mild pulmonary hypertension. Elevated LV EDP consistent with volume overload.  Right Atrium Right atrial pressure is normal.   Left Heart  Left Ventricle The left ventricular systolic function is normal. LV end diastolic pressure is moderately elevated. The left ventricular ejection fraction is  50-55% by visual estimate. No regional wall motion abnormalities.  Aortic Valve There is mild aortic valve stenosis.  There is moderate (3+) aortic regurgitation. The aortic valve is calcified.  Aorta Aortic Root:  The aortic root displays dilation. There is moderate (3+) aortic regurgitation. There is a mild gradient across the aortic valve.   Coronary Diagrams  Diagnostic Dominance: Right Intervention  Implants     No implant documentation for this case.   Syngo Images   Show images for CARDIAC CATHETERIZATION Images on Long Term Storage   Show images for [place], [place] to Procedure Log  Procedure Log    Hemo Data  Flowsheet Row Most Recent Value  Fick Cardiac Output 8.01 L/min  Fick Cardiac Output Index 3.04 (L/min)/BSA  RA A Wave 12 mmHg  RA V Wave 6 mmHg  RA Mean 5 mmHg  RV Systolic Pressure 59 mmHg  RV Diastolic Pressure 6 mmHg  RV EDP 11 mmHg  PA Systolic Pressure 54 mmHg  PA Diastolic Pressure 17 mmHg  PA Mean 31 mmHg  PW A Wave 29 mmHg  PW V Wave 39 mmHg  PW Mean 24 mmHg  AO Systolic Pressure 161 mmHg  AO Diastolic Pressure 60 mmHg  AO Mean 91 mmHg  LV Systolic Pressure 096 mmHg  LV Diastolic Pressure 7 mmHg  LV EDP 34 mmHg  AOp Systolic Pressure 045 mmHg  AOp Diastolic Pressure 61 mmHg  AOp Mean Pressure 90 mmHg  LVp Systolic Pressure 409 mmHg  LVp Diastolic Pressure 4 mmHg  LVp EDP Pressure 30 mmHg  QP/QS 1  TPVR Index 10.2 HRUI  TSVR Index 29.94 HRUI  PVR SVR Ratio 0.08  TPVR/TSVR Ratio 0.34   STS Risk of Mortality : 3.2%   Impression:  This 65 year old woman has stage D, severe, symptomatic aortic insufficiency and moderate aortic stenosis with New York Heart Association class II-III symptoms of exertional shortness of breath and fatigue consistent with chronic diastolic congestive heart failure.  I personally reviewed her 2D echocardiogram, cardiac catheterization, and CTA studies.  Her echocardiogram shows a trileaflet aortic valve  with severe calcification and thickening and restricted leaflet mobility.  The mean gradient is 28 mmHg with a peak gradient of 52 mmHg consistent with moderate aortic stenosis.  Visually there appears to be severe aortic insufficiency although the AI pressure half-time is only 603 ms.  Her left ventricular ejection fraction is normal.  LV diastolic dimension has increased from 4.7 cm in April to 5.5 cm in November.  LV systolic dimension has remained stable.  Cardiac catheterization showed no significant coronary disease.  There was mild pulmonary hypertension with an elevated LVEDP consistent with volume overload.  There was felt to be 3+ aortic insufficiency.  I think her aortic valve is severely diseased with at least moderate stenosis and severe insufficiency with signs of left ventricular overload.  I think would be best to proceed with aortic valve replacement before she develops progressive left ventricular dilation and dysfunction.  She is 65 years old but with morbid obesity with a weight of 340 pounds, diabetes and stage III chronic kidney disease, and CO2 retention on arterial blood gas suggesting obesity hypoventilation syndrome I think it is reasonable to recommend TAVR as an alternative to open surgical aortic valve replacement.  Her gated cardiac CTA shows anatomy suitable for TAVR using a SAPIEN 3 valve.  Her abdominal and pelvic CTA shows  Plan:     Gaye Pollack, MD 12/18/2020 4:04 PM

## 2020-12-19 ENCOUNTER — Other Ambulatory Visit: Payer: Self-pay

## 2020-12-19 DIAGNOSIS — I351 Nonrheumatic aortic (valve) insufficiency: Secondary | ICD-10-CM

## 2020-12-19 DIAGNOSIS — I350 Nonrheumatic aortic (valve) stenosis: Secondary | ICD-10-CM

## 2020-12-21 ENCOUNTER — Ambulatory Visit (HOSPITAL_COMMUNITY): Payer: Medicare Other

## 2020-12-21 NOTE — Progress Notes (Signed)
Surgical Instructions    Your procedure is scheduled on Tuesday, December 13th.  Report to [facility] Main Entrance "A" at 7:15 A.M., then check in with the Admitting office.  Call this number if you have problems the morning of surgery:  [phone]   If you have any questions prior to your surgery date call [phone]: Open Monday-Friday 8am-4pm    Remember:  Do not eat or drink after midnight the night before your surgery    Take these medicines the morning of surgery with A SIP OF WATER NONE    As of today, STOP taking any Aspirin (unless otherwise instructed by your surgeon) Aleve, Naproxen, Ibuprofen, Motrin, Advil, Goody's, BC's, all herbal medications, fish oil, and all vitamins.  WHAT DO I DO ABOUT MY DIABETES MEDICATION?   Do not take oral diabetes medicines (pills) the morning of surgery.   HOW TO MANAGE YOUR DIABETES BEFORE AND AFTER SURGERY  Why is it important to control my blood sugar before and after surgery? Improving blood sugar levels before and after surgery helps healing and can limit problems. A way of improving blood sugar control is eating a healthy diet by:  Eating less sugar and carbohydrates  Increasing activity/exercise  Talking with your doctor about reaching your blood sugar goals High blood sugars (greater than 180 mg/dL) can raise your risk of infections and slow your recovery, so you will need to focus on controlling your diabetes during the weeks before surgery. Make sure that the doctor who takes care of your diabetes knows about your planned surgery including the date and location.  How do I manage my blood sugar before surgery? Check your blood sugar at least 4 times a day, starting 2 days before surgery, to make sure that the level is not too high or low.  Check your blood sugar the morning of your surgery when you wake up and every 2 hours until you get to the Short Stay unit.  If your blood sugar is less than 70 mg/dL, you will  need to treat for low blood sugar: Do not take insulin. Treat a low blood sugar (less than 70 mg/dL) with  cup of clear juice (cranberry or apple), 4 glucose tablets, OR glucose gel. Recheck blood sugar in 15 minutes after treatment (to make sure it is greater than 70 mg/dL). If your blood sugar is not greater than 70 mg/dL on recheck, call [phone] for further instructions. Report your blood sugar to the short stay nurse when you get to Short Stay.  If you are admitted to the hospital after surgery: Your blood sugar will be checked by the staff and you will probably be given insulin after surgery (instead of oral diabetes medicines) to make sure you have good blood sugar levels. The goal for blood sugar control after surgery is 80-180 mg/dL.   After your COVID test   You are not required to quarantine however you are required to wear a well-fitting mask when you are out and around people not in your household.  If your mask becomes wet or soiled, replace with a new one.  Wash your hands often with soap and water for 20 seconds or clean your hands with an alcohol-based hand sanitizer that contains at least 60% alcohol.  Do not share personal items.  Notify your provider: if you are in close contact with someone who has COVID  or if you develop a fever of 100.4 or greater, sneezing, cough, sore throat, shortness of breath or  body aches.   DAY OF SURGERY         Do not wear jewelry or makeup Do not wear lotions, powders, perfumes, or deodorant. Do not shave 48 hours prior to surgery.  Men may shave face and neck. Do not bring valuables to the hospital. DO Not wear nail polish, gel polish, artificial nails, or any other type of covering on natural nails including finger and toenails. If patients have artificial nails, gel coating, etc. that need to be removed by a nail salon, please have this removed prior to surgery or surgery may need to be canceled/delayed if the surgeon/  anesthesia feels like the patient is unable to be adequately monitored.             [facility] is not responsible for any belongings or valuables.  Do NOT Smoke (Tobacco/Vaping)  24 hours prior to your procedure  If you use a CPAP at night, you may bring your mask for your overnight stay.   Contacts, glasses, hearing aids, dentures or partials may not be worn into surgery, please bring cases for these belongings   For patients admitted to the hospital, discharge time will be determined by your treatment team.   Patients discharged the day of surgery will not be allowed to drive home, and someone needs to stay with them for 24 hours.  NO VISITORS WILL BE ALLOWED IN PRE-OP WHERE PATIENTS ARE PREPPED FOR SURGERY.  ONLY 1 SUPPORT PERSON MAY BE PRESENT IN THE WAITING ROOM WHILE YOU ARE IN SURGERY.  IF YOU ARE TO BE ADMITTED, ONCE YOU ARE IN YOUR ROOM YOU WILL BE ALLOWED TWO (2) VISITORS. 1 (ONE) VISITOR MAY STAY OVERNIGHT BUT MUST ARRIVE TO THE ROOM BY 8pm.  Minor children may have two parents present. Special consideration for safety and communication needs will be reviewed on a case by case basis.  Special instructions:    Oral Hygiene is also important to reduce your risk of infection.  Remember - BRUSH YOUR TEETH THE MORNING OF SURGERY WITH YOUR REGULAR TOOTHPASTE   [facility]- Preparing For Surgery  Before surgery, you can play an important role. Because skin is not sterile, your skin needs to be as free of germs as possible. You can reduce the number of germs on your skin by washing with CHG (chlorahexidine gluconate) Soap before surgery.  CHG is an antiseptic cleaner which kills germs and bonds with the skin to continue killing germs even after washing.     Please do not use if you have an allergy to CHG or antibacterial soaps. If your skin becomes reddened/irritated stop using the CHG.  Do not shave (including legs and underarms) for at least 48 hours prior to first CHG shower. It is  OK to shave your face.  Please follow these instructions carefully.     Shower the NIGHT BEFORE SURGERY and the MORNING OF SURGERY with CHG Soap.   If you chose to wash your hair, wash your hair first as usual with your normal shampoo. After you shampoo, rinse your hair and body thoroughly to remove the shampoo.  Then [employer] and genitals (private parts) with your normal soap and rinse thoroughly to remove soap.  After that Use CHG Soap as you would any other liquid soap. You can apply CHG directly to the skin and wash gently with a scrungie or a clean washcloth.   Apply the CHG Soap to your body ONLY FROM THE NECK DOWN.  Do not use  on open wounds or open sores. Avoid contact with your eyes, ears, mouth and genitals (private parts). Wash Face and genitals (private parts)  with your normal soap.   Wash thoroughly, paying special attention to the area where your surgery will be performed.  Thoroughly rinse your body with warm water from the neck down.  DO NOT shower/wash with your normal soap after using and rinsing off the CHG Soap.  Pat yourself dry with a CLEAN TOWEL.  Wear CLEAN PAJAMAS to bed the night before surgery  Place CLEAN SHEETS on your bed the night before your surgery  DO NOT SLEEP WITH PETS.   Day of Surgery:  Take a shower with CHG soap. Wear Clean/Comfortable clothing the morning of surgery Do not apply any deodorants/lotions.   Remember to brush your teeth WITH YOUR REGULAR TOOTHPASTE.   Please read over the following fact sheets that you were given.

## 2020-12-22 ENCOUNTER — Other Ambulatory Visit: Payer: Self-pay

## 2020-12-22 ENCOUNTER — Encounter (HOSPITAL_COMMUNITY)
Admission: RE | Admit: 2020-12-22 | Discharge: 2020-12-22 | Disposition: A | Discharge status: Home / Self Care | Payer: Medicare Other | Source: Ambulatory Visit | Attending: Internal Medicine | Admitting: Internal Medicine

## 2020-12-22 ENCOUNTER — Encounter (HOSPITAL_COMMUNITY): Payer: Self-pay

## 2020-12-22 ENCOUNTER — Ambulatory Visit (HOSPITAL_COMMUNITY)
Admission: RE | Admit: 2020-12-22 | Discharge: 2020-12-22 | Disposition: A | Discharge status: Home / Self Care | Payer: Medicare Other | Source: Ambulatory Visit | Attending: Internal Medicine | Admitting: Internal Medicine

## 2020-12-22 VITALS — BP 123/77 | HR 71 | Temp 98.9°F | Resp 18 | Ht 71.0 in | Wt 341.5 lb

## 2020-12-22 DIAGNOSIS — E1122 Type 2 diabetes mellitus with diabetic chronic kidney disease: Secondary | ICD-10-CM | POA: Insufficient documentation

## 2020-12-22 DIAGNOSIS — Z20822 Contact with and (suspected) exposure to covid-19: Secondary | ICD-10-CM | POA: Diagnosis not present

## 2020-12-22 DIAGNOSIS — N182 Chronic kidney disease, stage 2 (mild): Secondary | ICD-10-CM | POA: Insufficient documentation

## 2020-12-22 DIAGNOSIS — Z01818 Encounter for other preprocedural examination: Secondary | ICD-10-CM | POA: Insufficient documentation

## 2020-12-22 DIAGNOSIS — I351 Nonrheumatic aortic (valve) insufficiency: Secondary | ICD-10-CM

## 2020-12-22 DIAGNOSIS — I352 Nonrheumatic aortic (valve) stenosis with insufficiency: Secondary | ICD-10-CM | POA: Diagnosis not present

## 2020-12-22 DIAGNOSIS — Z87891 Personal history of nicotine dependence: Secondary | ICD-10-CM | POA: Diagnosis not present

## 2020-12-22 DIAGNOSIS — I130 Hypertensive heart and chronic kidney disease with heart failure and stage 1 through stage 4 chronic kidney disease, or unspecified chronic kidney disease: Secondary | ICD-10-CM | POA: Diagnosis not present

## 2020-12-22 DIAGNOSIS — I350 Nonrheumatic aortic (valve) stenosis: Secondary | ICD-10-CM

## 2020-12-22 HISTORY — DX: Pneumonia, unspecified organism: J18.9

## 2020-12-22 LAB — URINALYSIS, ROUTINE W REFLEX MICROSCOPIC
Bilirubin Urine: NEGATIVE
Glucose, UA: NEGATIVE mg/dL
Hgb urine dipstick: NEGATIVE
Ketones, ur: NEGATIVE mg/dL
Leukocytes,Ua: NEGATIVE
Nitrite: NEGATIVE
Protein, ur: NEGATIVE mg/dL
Specific Gravity, Urine: 1.025 (ref 1.005–1.030)
pH: 5.5 (ref 5.0–8.0)

## 2020-12-22 LAB — CBC
HCT: 36.6 % (ref 36.0–46.0)
Hemoglobin: 11.4 g/dL — ABNORMAL LOW (ref 12.0–15.0)
MCH: 27 pg (ref 26.0–34.0)
MCHC: 31.1 g/dL (ref 30.0–36.0)
MCV: 86.5 fL (ref 80.0–100.0)
Platelets: 216 10*3/uL (ref 150–400)
RBC: 4.23 MIL/uL (ref 3.87–5.11)
RDW: 15.7 % — ABNORMAL HIGH (ref 11.5–15.5)
WBC: 4.4 10*3/uL (ref 4.0–10.5)
nRBC: 0 % (ref 0.0–0.2)

## 2020-12-22 LAB — COMPREHENSIVE METABOLIC PANEL
ALT: 25 U/L (ref 0–44)
AST: 24 U/L (ref 15–41)
Albumin: 3.6 g/dL (ref 3.5–5.0)
Alkaline Phosphatase: 64 U/L (ref 38–126)
Anion gap: 10 (ref 5–15)
BUN: 10 mg/dL (ref 8–23)
CO2: 21 mmol/L — ABNORMAL LOW (ref 22–32)
Calcium: 9.1 mg/dL (ref 8.9–10.3)
Chloride: 108 mmol/L (ref 98–111)
Creatinine, Ser: 1.05 mg/dL — ABNORMAL HIGH (ref 0.44–1.00)
GFR, Estimated: 59 mL/min — ABNORMAL LOW (ref >60)
Glucose, Bld: 128 mg/dL — ABNORMAL HIGH (ref 70–99)
Potassium: 3.9 mmol/L (ref 3.5–5.1)
Sodium: 139 mmol/L (ref 135–145)
Total Bilirubin: 0.5 mg/dL (ref 0.3–1.2)
Total Protein: 7.3 g/dL (ref 6.5–8.1)

## 2020-12-22 LAB — TYPE AND SCREEN
ABO/RH(D): O POS
Antibody Screen: NEGATIVE

## 2020-12-22 LAB — BLOOD GAS, ARTERIAL
Acid-base deficit: 0.1 mmol/L (ref 0.0–2.0)
Bicarbonate: 24.2 mmol/L (ref 20.0–28.0)
FIO2: 21
O2 Saturation: 98.8 %
Patient temperature: 37
pCO2 arterial: 40.4 mmHg (ref 32.0–48.0)
pH, Arterial: 7.395 (ref 7.350–7.450)
pO2, Arterial: 129 mmHg — ABNORMAL HIGH (ref 83.0–108.0)

## 2020-12-22 LAB — PROTIME-INR
INR: 1 (ref 0.8–1.2)
Prothrombin Time: 13.3 seconds (ref 11.4–15.2)

## 2020-12-22 LAB — SURGICAL PCR SCREEN
MRSA, PCR: NEGATIVE
Staphylococcus aureus: NEGATIVE

## 2020-12-22 LAB — GLUCOSE, CAPILLARY: Glucose-Capillary: 121 mg/dL — ABNORMAL HIGH (ref 70–99)

## 2020-12-22 LAB — BRAIN NATRIURETIC PEPTIDE: B Natriuretic Peptide: 116.3 pg/mL — ABNORMAL HIGH (ref 0.0–100.0)

## 2020-12-22 LAB — SARS CORONAVIRUS 2 (TAT 6-24 HRS): SARS Coronavirus 2: NEGATIVE

## 2020-12-22 NOTE — Progress Notes (Signed)
PCP - Wilfred Lacy Cardiologist - Dr. [name]  Chest x-ray - 12/22/20 EKG - 12/22/20 ECHO - 11/15/20 Cardiac Cath - 11/24/20  DM - Type 2 Patient stated that she does not check CBGs at home, & does not currently take any diabetic medications  COVID TEST- 12/22/20   Anesthesia review: yes, heart history  Patient denies shortness of breath, fever, cough and chest pain at PAT appointment   All instructions explained to the patient, with a verbal understanding of the material. Patient agrees to go over the instructions while at home for a better understanding. Patient also instructed to self quarantine after being tested for COVID-19. The opportunity to ask questions was provided.

## 2020-12-25 ENCOUNTER — Other Ambulatory Visit: Payer: Self-pay | Admitting: Physician Assistant

## 2020-12-25 DIAGNOSIS — Z952 Presence of prosthetic heart valve: Secondary | ICD-10-CM

## 2020-12-25 MED ORDER — HEPARIN 30,000 UNITS/1000 ML (OHS) CELLSAVER SOLUTION
Status: DC
  Filled 2020-12-25: qty 1000

## 2020-12-25 MED ORDER — CEFAZOLIN IN SODIUM CHLORIDE 3-0.9 GM/100ML-% IV SOLN
3.0000 g | INTRAVENOUS | Status: AC
  Administered 2020-12-26: 3 g via INTRAVENOUS
  Filled 2020-12-25: qty 100

## 2020-12-25 MED ORDER — MAGNESIUM SULFATE 50 % IJ SOLN
40.0000 meq | INTRAMUSCULAR | Status: DC
  Filled 2020-12-25: qty 9.85

## 2020-12-25 MED ORDER — POTASSIUM CHLORIDE 2 MEQ/ML IV SOLN
80.0000 meq | INTRAVENOUS | Status: DC
  Filled 2020-12-25: qty 40

## 2020-12-25 MED ORDER — NOREPINEPHRINE 4 MG/250ML-% IV SOLN
0.0000 ug/min | INTRAVENOUS | Status: DC
  Filled 2020-12-25: qty 250

## 2020-12-25 MED ORDER — DEXMEDETOMIDINE HCL IN NACL 400 MCG/100ML IV SOLN
0.1000 ug/kg/h | INTRAVENOUS | Status: AC
  Administered 2020-12-26: 6 ug/kg/h via INTRAVENOUS
  Filled 2020-12-25 (×2): qty 100

## 2020-12-25 MED ORDER — CEFAZOLIN SODIUM-DEXTROSE 2-4 GM/100ML-% IV SOLN
2.0000 g | INTRAVENOUS | Status: DC
  Filled 2020-12-25 (×2): qty 100

## 2020-12-25 NOTE — Progress Notes (Signed)
Anesthesia Chart Review:  Case: [id number] Date/Time: 12/26/20 0900   Procedures:      TRANSCATHETER AORTIC VALVE REPLACEMENT, TRANSFEMORAL     INTRAOPERATIVE TRANSTHORACIC ECHOCARDIOGRAM   Anesthesia type: Monitor Anesthesia Care   Pre-op diagnosis: Severe Aortic Insufficiency and Moderate Aortic Stenosis   Location: [facility] CATH LAB 6 / [facility]   Providers: Early Osmond, MD     CT Surgeon: Gilford Raid, [facility]  DISCUSSION: Patient is a 65 year old female scheduled for the above procedure.  History includes former smoker, murmur (severe AI/moderate-severe AS), HTN, DM2, asthma, hypercholesterolemia, CKD (stage II), palpitations, dysphagia. BMI is consistent with morbid obesity.    12/22/2020 presurgical COVID-19 test negative.  Anesthesia team to evaluate on the day of surgery.   VS: BP 123/77   Pulse 71   Temp 37.2 C (Oral)   Resp 18   Ht 5\' 11"  (1.803 m)   Wt (!) 154.9 kg   SpO2 100%   BMI 47.63 kg/m    PROVIDERS: [name], [name], NP is PCP  [name], MD is cardiologist  Silvano Rusk, MD is GI   LABS: Labs reviewed: Acceptable for surgery. A1c 6.1% 12/12/20.  (all labs ordered are listed, but only abnormal results are displayed)  Labs Reviewed  GLUCOSE, CAPILLARY - Abnormal; Notable for the following components:      Result Value   Glucose-Capillary 121 (*)    All other components within normal limits  CBC - Abnormal; Notable for the following components:   Hemoglobin 11.4 (*)    RDW 15.7 (*)    All other components within normal limits  COMPREHENSIVE METABOLIC PANEL - Abnormal; Notable for the following components:   CO2 21 (*)    Glucose, Bld 128 (*)    Creatinine, Ser 1.05 (*)    GFR, Estimated 59 (*)    All other components within normal limits  BLOOD GAS, ARTERIAL - Abnormal; Notable for the following components:   pO2, Arterial 129 (*)    Allens test (pass/fail) BRACHIAL ARTERY (*)    All other components within normal limits  BRAIN  NATRIURETIC PEPTIDE - Abnormal; Notable for the following components:   B Natriuretic Peptide 116.3 (*)    All other components within normal limits  SURGICAL PCR SCREEN  SARS CORONAVIRUS 2 (TAT 6-24 HRS)  PROTIME-INR  URINALYSIS, ROUTINE W REFLEX MICROSCOPIC  TYPE AND SCREEN    Preoperative chest x-ray and EKG reviewed.  CV: CT Coronary 12/14/20: IMPRESSION: Poor quality study.  Poor opacification of aorta 1.  Calcified tri leaflet AV with calcium score 1835 2.  Annular area of 479 mm2 suitable for a 26 mm Sapien 3 valve 3.  Coronary arteries sufficient height above annulus for deployment 4. Optimum angiographic angle for deployment LAO 10 cranial 4 degrees    Cardiac cath 11/24/20: Mildly dilated ascending aorta. Moderately severe aortic regurgitation.  Mild aortic stenosis. Moderately severe elevation in left ventricular end-diastolic pressure, 32 mmHg. Elevated mean pulmonary wedge pressure, 24 mmHg.  V wave 40 mmHg. Mild pulmonary hypertension with PA mean 31 mmHg and phasic pressure 54 over 17 mmHg. Widely patent coronary arteries without anomalous origin.   Echo 11/15/20: IMPRESSIONS   1. Left ventricular ejection fraction, by estimation, is 70 to 75%. The  left ventricle has hyperdynamic function. The left ventricle has no  regional wall motion abnormalities. Left ventricular diastolic parameters  are consistent with Grade II diastolic  dysfunction (pseudonormalization). The average left ventricular global  longitudinal strain is 22.8 %.  The global longitudinal strain is normal.   2. Right ventricular systolic function is normal. The right ventricular  size is normal. There is normal pulmonary artery systolic pressure.   3. Left atrial size was moderately dilated.   4. The mitral valve is normal in structure. Mild mitral valve  regurgitation. No evidence of mitral stenosis.   5. The aortic valve is tricuspid. There is severe calcifcation of the  aortic valve. There  is severe thickening of the aortic valve. Aortic valve  regurgitation is severe. Moderate to severe aortic valve stenosis. Aortic  valve mean gradient measures 28.0  mmHg. Aortic valve Vmax measures 3.59 m/s.   6. Aortic dilatation noted. There is borderline dilatation of the  ascending aorta, measuring 39 mm.   7. The inferior vena cava is normal in size with greater than 50%  respiratory variability, suggesting right atrial pressure of 3 mmHg.  - Comparison(s): Aortic regurgitation has advanced.   Past Medical History:  Diagnosis Date   Adhesive capsulitis    Allergy    Aortic valve stenosis    Arthritis    Asthma    Back pain    Bilateral swelling of feet    CKD (chronic kidney disease) stage 3, GFR 30-59 ml/min (HCC)    Constipation    Diabetes mellitus without complication (HCC)    Diuretic-induced hypokalemia    Heart murmur    Heartburn    Hypercholesterolemia    Hypertension    IT band syndrome    Joint pain    Obesity    Palpitations    Periumbilical pain    Pneumonia    Postmenopausal estrogen deficiency    Prediabetes    Shoulder pain, right    Swallowing difficulty    Ventral hernia     Past Surgical History:  Procedure Laterality Date   CESAREAN SECTION  1989   COLONOSCOPY     RIGHT/LEFT HEART CATH AND CORONARY ANGIOGRAPHY N/A 11/24/2020   Procedure: RIGHT/LEFT HEART CATH AND CORONARY ANGIOGRAPHY;  Surgeon: Belva Crome, MD;  Location: [facility];  Service: Cardiovascular;  Laterality: N/A;   TONSILLECTOMY AND ADENOIDECTOMY  1960    MEDICATIONS:  amLODipine (NORVASC) 10 MG tablet   Ascorbic Acid (VITAMIN C) 1000 MG tablet   atorvastatin (LIPITOR) 10 MG tablet   Bayer Microlet Lancets lancets   Blood Pressure Monitoring (SPHYGMOMANOMETER) MISC   Cholecalciferol 50 MCG (2000 UT) TABS   Coenzyme Q10 (CO Q-10) 100 MG CAPS   Cyanocobalamin (B-12) 500 MCG TABS   glucose blood (CONTOUR NEXT TEST) test strip   hydrALAZINE (APRESOLINE) 25 MG  tablet   Insulin Pen Needle (BD PEN NEEDLE NANO 2ND GEN) 32G X 4 MM MISC   losartan (COZAAR) 100 MG tablet   Menthol, Topical Analgesic, (BLUE-EMU MAXIMUM STRENGTH) 2.5 % LIQD   metoprolol succinate (TOPROL-XL) 25 MG 24 hr tablet   neomycin-polymyxin-hydrocortisone (CORTISPORIN) OTIC solution   Zinc 50 MG CAPS   No current facility-administered medications for this encounter.    [START ON 12/26/2020] ceFAZolin (ANCEF) IVPB 3g/100 mL premix   [START ON 12/26/2020] dexmedetomidine (PRECEDEX) 400 MCG/100ML (4 mcg/mL) infusion   [START ON 12/26/2020] heparin 30,000 units/NS 1000 mL solution for CELLSAVER   [START ON 12/26/2020] magnesium sulfate (IV Push/IM) injection 40 mEq   [START ON 12/26/2020] norepinephrine (LEVOPHED) 4mg  in 225mL (0.016 mg/mL) premix infusion   [START ON 12/26/2020] potassium chloride injection 80 mEq    Myra Gianotti, PA-C Surgical Short Stay/Anesthesiology [facility] Phone [phone])  [phone] [facility] Phone [phone] 12/25/2020 10:25 AM

## 2020-12-25 NOTE — Anesthesia Preprocedure Evaluation (Addendum)
Anesthesia Evaluation  Patient identified by MRN, date of birth, ID band Patient awake    Reviewed: Allergy & Precautions, NPO status , Patient's Chart, lab work & pertinent test results  Airway Mallampati: II  TM Distance: >3 FB     Dental   Pulmonary asthma , pneumonia, former smoker,    breath sounds clear to auscultation       Cardiovascular hypertension, + Valvular Problems/Murmurs AS  Rhythm:Regular Rate:Normal     Neuro/Psych    GI/Hepatic negative GI ROS, Neg liver ROS,   Endo/Other  diabetes  Renal/GU Renal disease     Musculoskeletal  (+) Arthritis ,   Abdominal   Peds  Hematology   Anesthesia Other Findings   Reproductive/Obstetrics                            Anesthesia Physical Anesthesia Plan  ASA: 3  Anesthesia Plan: General   Post-op Pain Management:    Induction: Intravenous  PONV Risk Score and Plan: 3 and Ondansetron and Propofol infusion  Airway Management Planned: Simple Face Mask  Additional Equipment:   Intra-op Plan:   Post-operative Plan:   Informed Consent: I have reviewed the patients History and Physical, chart, labs and discussed the procedure including the risks, benefits and alternatives for the proposed anesthesia with the patient or authorized representative who has indicated his/her understanding and acceptance.     Dental advisory given  Plan Discussed with: CRNA and Anesthesiologist  Anesthesia Plan Comments: (PAT note written 12/25/2020 by Myra Gianotti, PA-C. )       Anesthesia Quick Evaluation

## 2020-12-25 NOTE — H&P (Signed)
[address]       [place] [address]             [phone]      Cardiothoracic Surgery Admission History and Physical  PCP is [name], [name], NP Referring Provider is Lenna Sciara, MD Primary Cardiologist is None   Reason for admission:  Moderate aortic stenosis and severe aortic insufficiency   HPI:   The patient is a 65 year old woman with a history of morbid obesity, hypertension, hyperlipidemia, type 2 diabetes, and stage III chronic kidney disease who presents with a several month history of exertional shortness of breath and fatigue.  She reports having shortness of breath with walking around in stores and had some shortness of breath walking into the office this morning.  She denies any chest pain or pressure.  She has had no dizziness or syncope. She has no lower extremity edema.  She denies orthopnea and PND.  A 2D echocardiogram on 11/15/2020 showed a trileaflet aortic valve with severe calcification and thickening.  The mean gradient was 28 mmHg with a peak gradient of 52 mmHg.  AI pressure half-time was measured at 603 ms although visually the aortic valve regurgitation is severe.  There is no mitral stenosis and only mild mitral regurgitation.  Left ventricular ejection fraction is 70 to 75%.  There is grade 2 diastolic dysfunction.  Cardiac catheterization showed no significant coronary disease.  PA pressure was elevated at 54/17 with a mean of 31.  Mean pulmonary capillary wedge pressure was 24.  LVEDP was 34.  Cardiac index was 3.0.  Mean right atrial pressure was 5.   The patient is retired from the post office.  She is single and has 1 adult child.  She has a fairly sedentary lifestyle and does not exercise.        Past Medical History:  Diagnosis Date   Adhesive capsulitis     Allergy     Aortic valve stenosis     Arthritis     Asthma     Back pain     Bilateral swelling of feet     CKD (chronic kidney disease) stage 3, GFR 30-59 ml/min  (HCC)     Constipation     Diabetes mellitus without complication (HCC)     Diuretic-induced hypokalemia     Heart murmur     Heartburn     Hypercholesterolemia     Hypertension     IT band syndrome     Joint pain     Obesity     Palpitations     Periumbilical pain     Postmenopausal estrogen deficiency     Prediabetes     Shoulder pain, right     Swallowing difficulty     Ventral hernia                 Past Surgical History:  Procedure Laterality Date   CESAREAN SECTION   1989   COLONOSCOPY       RIGHT/LEFT HEART CATH AND CORONARY ANGIOGRAPHY N/A 11/24/2020    Procedure: RIGHT/LEFT HEART CATH AND CORONARY ANGIOGRAPHY;  Surgeon: Belva Crome, MD;  Location: [facility];  Service: Cardiovascular;  Laterality: N/A;   TONSILLECTOMY AND ADENOIDECTOMY   1960               Family History  Problem Relation Age of Onset   Arthritis Mother     Cancer Mother  pancreatic   Hypertension Mother     Obesity Mother     Cancer Father          prostate and colon   Colon cancer Father     Heart disease Father     Thyroid disease Father        Social History             Socioeconomic History   Marital status: Single      Spouse name: Not on file   Number of children: 1   Years of education: Not on file   Highest education level: Not on file  Occupational History   Occupation: retired  Tobacco Use   Smoking status: Former      Types: Cigarettes   Smokeless tobacco: Never  Scientific laboratory technician Use: Never used  Substance and Sexual Activity   Alcohol use: Yes      Comment: socially   Drug use: No   Sexual activity: Not on file  Other Topics Concern   Not on file  Social History Narrative   Not on file    Social Determinants of Health         Financial Resource Strain: Medium Risk   Difficulty of Paying Living Expenses: Somewhat hard  Food Insecurity: No Food Insecurity   Worried About Charity fundraiser in the Last Year: Never true   [place] in the Last Year: Never true  Transportation Needs: No Transportation Needs   Lack of Transportation (Medical): No   Lack of Transportation (Non-Medical): No  Physical Activity: Inactive   Days of Exercise per Week: 0 days   Minutes of Exercise per Session: 0 min  Stress: No Stress Concern Present   Feeling of Stress : Not at all  Social Connections: Socially Isolated   Frequency of Communication with Friends and Family: More than three times a week   Frequency of Social Gatherings with Friends and Family: Once a week   Attends Religious Services: Never   Marine scientist or Organizations: No   Attends Archivist Meetings: Never   Marital Status: Never married  Human resources officer Violence: Not At Risk   Fear of Current or Ex-Partner: No   Emotionally Abused: No   Physically Abused: No   Sexually Abused: No                   Prior to Admission medications   Medication Sig Start Date End Date Taking? Authorizing Provider  amLODipine (NORVASC) 10 MG tablet Take 1 tablet (10 mg total) by mouth daily. 12/12/20   Yes [name], [name], NP  Ascorbic Acid (VITAMIN C) 1000 MG tablet Take 1,000 mg by mouth daily.     Yes [provider]  atorvastatin (LIPITOR) 10 MG tablet Take 1 tablet (10 mg total) by mouth daily. 11/10/20   Yes Belva Crome, MD  Bayer Microlet Lancets lancets Use when testing glucose as recommended. 11/27/18   Yes [provider]  Blood Pressure Monitoring (SPHYGMOMANOMETER) MISC 1 Units by Does not apply route daily. 07/30/16   Yes [name], [name], NP  Cholecalciferol 50 MCG (2000 UT) TABS Take 2,000 Units by mouth daily.     Yes [provider]  Coenzyme Q10 (CO Q-10) 100 MG CAPS Take 100 mg by mouth daily.      Yes [provider]  Cyanocobalamin (B-12) 500 MCG TABS Take 500 mcg by mouth daily.  Yes [provider]  glucose blood (CONTOUR NEXT TEST) test strip Test up to 4 times per day as  recommended 11/27/18   Yes [provider]  hydrALAZINE (APRESOLINE) 25 MG tablet Take 1 tablet (25 mg total) by mouth 2 (two) times daily. 12/12/20   Yes [name], [name], NP  Insulin Pen Needle (BD PEN NEEDLE NANO 2ND GEN) 32G X 4 MM MISC 1 Package by Does not apply route 2 (two) times daily. 03/15/20   Yes Laqueta Linden, MD  losartan (COZAAR) 100 MG tablet Take 1 tablet (100 mg total) by mouth daily. 12/12/20   Yes [name], [name], NP  metoprolol succinate (TOPROL-XL) 25 MG 24 hr tablet Take 25 mg by mouth daily. 07/25/20   Yes [provider]  neomycin-polymyxin-hydrocortisone (CORTISPORIN) OTIC solution Apply 1-2 drops to toenail if there is soreness once daily after shower Patient taking differently: 1-2 drops See admin instructions. Apply 1-2 drops to toenail if there is soreness once daily after shower as needed 07/27/20   Yes Stover, Titorya, DPM  Zinc 50 MG CAPS Take 50 mg by mouth.     Yes [provider]                 Current Outpatient Medications  Medication Sig Dispense Refill   amLODipine (NORVASC) 10 MG tablet Take 1 tablet (10 mg total) by mouth daily. 90 tablet 3   Ascorbic Acid (VITAMIN C) 1000 MG tablet Take 1,000 mg by mouth daily.       atorvastatin (LIPITOR) 10 MG tablet Take 1 tablet (10 mg total) by mouth daily. 90 tablet 3   Bayer Microlet Lancets lancets Use when testing glucose as recommended.       Blood Pressure Monitoring (SPHYGMOMANOMETER) MISC 1 Units by Does not apply route daily. 1 each 0   Cholecalciferol 50 MCG (2000 UT) TABS Take 2,000 Units by mouth daily.       Coenzyme Q10 (CO Q-10) 100 MG CAPS Take 100 mg by mouth daily.        Cyanocobalamin (B-12) 500 MCG TABS Take 500 mcg by mouth daily.       glucose blood (CONTOUR NEXT TEST) test strip Test up to 4 times per day as recommended       hydrALAZINE (APRESOLINE) 25 MG tablet Take 1 tablet (25 mg total) by mouth 2 (two) times daily. 180 tablet 3   Insulin Pen  Needle (BD PEN NEEDLE NANO 2ND GEN) 32G X 4 MM MISC 1 Package by Does not apply route 2 (two) times daily. 100 each 0   losartan (COZAAR) 100 MG tablet Take 1 tablet (100 mg total) by mouth daily. 90 tablet 3   metoprolol succinate (TOPROL-XL) 25 MG 24 hr tablet Take 25 mg by mouth daily.       neomycin-polymyxin-hydrocortisone (CORTISPORIN) OTIC solution Apply 1-2 drops to toenail if there is soreness once daily after shower (Patient taking differently: 1-2 drops See admin instructions. Apply 1-2 drops to toenail if there is soreness once daily after shower as needed) 10 mL 0   Zinc 50 MG CAPS Take 50 mg by mouth.        No current facility-administered medications for this visit.      No Known Allergies       Review of Systems:               General:  normal appetite, + decreased energy, no weight gain, no weight loss, no fever             Cardiac:                       no chest pain with exertion, no chest pain at rest, +SOB with mild exertion, no resting SOB, no PND, no orthopnea, + palpitations, no arrhythmia, no atrial fibrillation, no LE edema, no dizzy spells, no syncope             Respiratory:                 + exertional shortness of breath, no home oxygen, no productive cough, no dry cough, no bronchitis, no wheezing, no hemoptysis, no asthma, no pain with inspiration or cough, no sleep apnea, no CPAP at night             GI:                               no difficulty swallowing, no reflux, no frequent heartburn, no hiatal hernia, no abdominal pain, no constipation, no diarrhea, no hematochezia, no hematemesis, no melena             GU:                              no dysuria,  no frequency, no urinary tract infection, no hematuria, no kidney stones, + chronic kidney disease             Vascular:                     no pain suggestive of claudication, no pain in feet, no leg cramps, no varicose veins, no DVT, no non-healing foot ulcer             Neuro:                          no stroke, no TIA's, no seizures, no headaches, no temporary blindness one eye,  no slurred speech, no peripheral neuropathy, no chronic pain, no instability of gait, no memory/cognitive dysfunction             Musculoskeletal:         no arthritis,  no joint swelling, no myalgias, no difficulty walking, normal mobility              Skin:                            no rash, no itching, no skin infections, no pressure sores or ulcerations             Psych:                         no anxiety, no depression, no nervousness, no unusual recent stress             Eyes:                           no blurry vision, no floaters, no recent vision changes, + wears glasses or contacts             ENT:  no hearing loss, no loose or painful teeth, no dentures, last saw dentist recently for extractions.             Hematologic:               no easy bruising, no abnormal bleeding, no clotting disorder, no frequent epistaxis             Endocrine:                   + diabetes, does check CBG's at home                            Physical Exam:               BP (!) 180/74 (BP Location: Left Arm, Patient Position: Sitting)   Pulse 91   Resp 20   Ht 5\' 10"  (1.778 m)   Wt (!) 340 lb (154.2 kg)   SpO2 94% Comment: RA  BMI 48.78 kg/m              General:                      well-appearing             HEENT:                       Unremarkable, NCAT, PERLA, EOMI             Neck:                           no JVD, no bruits, no adenopathy              Chest:                          clear to auscultation, symmetrical breath sounds, no wheezes, no rhonchi              CV:                              RRR, 3/6 systolic murmur RSB, 2/6 diastolic murmur LLSB.             Abdomen:                    soft, non-tender, no masses              Extremities:                 warm, well-perfused, pulses palpable at ankle, no lower extremity edema             Rectal/GU                    Deferred             Neuro:                         Grossly non-focal and symmetrical throughout             Skin:                            Clean and dry, no rashes, no breakdown  Diagnostic Tests:   ECHOCARDIOGRAM REPORT         Patient Name:   Sole Lengacher Date of Exam: 11/15/2020  Medical Rec #:  [id number]      Height:       70.0 in  Accession #:    [id number]     Weight:       355.0 lb  Date of Birth:  27-Jun-1955     BSA:          2.664 m  Patient Age:    40 years       BP:           160/90 mmHg  Patient Gender: F              HR:           63 bpm.  Exam Location:  [place]   Procedure: 2D Echo, 3D Echo, Cardiac Doppler, Color Doppler and Strain  Analysis   Indications:    I35.1 Aortic Insufficiency     History:        Patient has prior history of Echocardiogram examinations,  most                  recent 05/03/2020. Aortic Valve Disease,  Signs/Symptoms:Edema and                  Murmur; Risk Factors:Hypertension, Diabetes, Dyslipidemia,                  Family History of Coronary Artery Disease and Former  Smoker.                  Obesity, Palpitations.     Sonographer:    Deliah Boston RDCS  Referring Phys: Isaiah Serge   IMPRESSIONS     1. Left ventricular ejection fraction, by estimation, is 70 to 75%. The  left ventricle has hyperdynamic function. The left ventricle has no  regional wall motion abnormalities. Left ventricular diastolic parameters  are consistent with Grade II diastolic  dysfunction (pseudonormalization). The average left ventricular global  longitudinal strain is 22.8 %. The global longitudinal strain is normal.   2. Right ventricular systolic function is normal. The right ventricular  size is normal. There is normal pulmonary artery systolic pressure.   3. Left atrial size was moderately dilated.   4. The mitral valve is normal in structure. Mild mitral valve  regurgitation. No evidence of mitral stenosis.   5. The aortic  valve is tricuspid. There is severe calcifcation of the  aortic valve. There is severe thickening of the aortic valve. Aortic valve  regurgitation is severe. Moderate to severe aortic valve stenosis. Aortic  valve mean gradient measures 28.0  mmHg. Aortic valve Vmax measures 3.59 m/s.   6. Aortic dilatation noted. There is borderline dilatation of the  ascending aorta, measuring 39 mm.   7. The inferior vena cava is normal in size with greater than 50%  respiratory variability, suggesting right atrial pressure of 3 mmHg.   Comparison(s): Aortic regurgitation has advanced.   FINDINGS   Left Ventricle: Left ventricular ejection fraction, by estimation, is 70  to 75%. The left ventricle has hyperdynamic function. The left ventricle  has no regional wall motion abnormalities. The average left ventricular  global longitudinal strain is 22.8  %. The global longitudinal strain is normal. The left ventricular internal  cavity size was normal in size. There is no left ventricular hypertrophy.  Left ventricular diastolic parameters are  consistent with Grade II  diastolic dysfunction  (pseudonormalization).   Right Ventricle: The right ventricular size is normal. No increase in  right ventricular wall thickness. Right ventricular systolic function is  normal. There is normal pulmonary artery systolic pressure. The tricuspid  regurgitant velocity is 1.95 m/s, and   with an assumed right atrial pressure of 3 mmHg, the estimated right  ventricular systolic pressure is 40.9 mmHg.   Left Atrium: Left atrial size was moderately dilated.   Right Atrium: Right atrial size was normal in size.   Pericardium: There is no evidence of pericardial effusion.   Mitral Valve: The mitral valve is normal in structure. Mild mitral valve  regurgitation. No evidence of mitral valve stenosis. MV peak gradient, 8.4  mmHg. The mean mitral valve gradient is 2.0 mmHg.   Tricuspid Valve: The tricuspid valve is  normal in structure. Tricuspid  valve regurgitation is trivial. No evidence of tricuspid stenosis.   Aortic Valve: The aortic valve is tricuspid. There is severe calcifcation  of the aortic valve. There is severe thickening of the aortic valve.  Aortic valve regurgitation is severe. Aortic regurgitation PHT measures  603 msec. Moderate to severe aortic  stenosis is present. Aortic valve mean gradient measures 28.0 mmHg. Aortic  valve peak gradient measures 51.7 mmHg. Aortic valve area, by VTI measures  1.76 cm.   Pulmonic Valve: The pulmonic valve was normal in structure. Pulmonic valve  regurgitation is not visualized. No evidence of pulmonic stenosis.   Aorta: Aortic dilatation noted. There is borderline dilatation of the  ascending aorta, measuring 39 mm.   Venous: The inferior vena cava is normal in size with greater than 50%  respiratory variability, suggesting right atrial pressure of 3 mmHg.   IAS/Shunts: No atrial level shunt detected by color flow Doppler.      LEFT VENTRICLE  PLAX 2D  LVIDd:         5.50 cm   Diastology  LVIDs:         2.70 cm   LV e' medial:    7.94 cm/s  LV PW:         1.30 cm   LV E/e' medial:  19.9  LV IVS:        1.10 cm   LV e' lateral:   7.18 cm/s  LVOT diam:     2.40 cm   LV E/e' lateral: 22.0  LV SV:         159  LV SV Index:   60        2D Longitudinal Strain  LVOT Area:     4.52 cm  2D Strain GLS (A2C):   21.5 %                           2D Strain GLS (A3C):   23.2 %                           2D Strain GLS (A4C):   23.7 %                           2D Strain GLS Avg:     22.8 %                              3D Volume EF:  3D EF:        63 %                           LV EDV:       168 ml                           LV ESV:       62 ml                           LV SV:        105 ml   RIGHT VENTRICLE  RV S prime:     12.90 cm/s  TAPSE (M-mode): 2.2 cm   LEFT ATRIUM              Index        RIGHT ATRIUM            Index  LA diam:        4.40 cm  1.65 cm/m   RA Area:     14.00 cm  LA Vol (A2C):   91.2 ml  34.24 ml/m  RA Volume:   34.10 ml  12.80 ml/m  LA Vol (A4C):   111.0 ml 41.67 ml/m  LA Biplane Vol: 107.0 ml 40.17 ml/m   AORTIC VALVE  AV Area (Vmax):    1.73 cm  AV Area (Vmean):   1.79 cm  AV Area (VTI):     1.76 cm  AV Vmax:           359.40 cm/s  AV Vmean:          244.200 cm/s  AV VTI:            0.905 m  AV Peak Grad:      51.7 mmHg  AV Mean Grad:      28.0 mmHg  LVOT Vmax:         137.50 cm/s  LVOT Vmean:        96.550 cm/s  LVOT VTI:          0.352 m  LVOT/AV VTI ratio: 0.39  AI PHT:            603 msec     AORTA  Ao Root diam: 3.70 cm  Ao Asc diam:  3.90 cm   MITRAL VALVE                TRICUSPID VALVE  MV Area (PHT): cm          TR Peak grad:   15.2 mmHg  MV Area VTI:   2.97 cm     TR Vmax:        195.00 cm/s  MV Peak grad:  8.4 mmHg  MV Mean grad:  2.0 mmHg     SHUNTS  MV Vmax:       1.45 m/s     Systemic VTI:  0.35 m  MV Vmean:      64.0 cm/s    Systemic Diam: 2.40 cm  MV Decel Time: 186 msec  MV E velocity: 157.75 cm/s  MV A velocity: 55.08 cm/s  MV E/A ratio:  2.86   Candee Furbish MD  Electronically signed by Candee Furbish MD  Signature Date/Time: 11/15/2020/11:37:12 AM         Final       Physicians   Panel Physicians  Referring Physician Case Authorizing Physician  Belva Crome, MD (Primary)        Procedures   RIGHT/LEFT HEART CATH AND CORONARY ANGIOGRAPHY    Conclusion   Mildly dilated ascending aorta. Moderately severe aortic regurgitation.  Mild aortic stenosis. Moderately severe elevation in left ventricular end-diastolic pressure, 32 mmHg. Elevated mean pulmonary wedge pressure, 24 mmHg.  V wave 40 mmHg. Mild pulmonary hypertension with PA mean 31 mmHg and phasic pressure 54 over 17 mmHg. Widely patent coronary arteries without anomalous origin.   RECOMMENDATION:   Better blood pressure control. Surgical consultation for  comanagement.  Patient is not limited by symptoms but is quite sedentary. Echocardiography suggests severe aortic regurgitation although this is not borne out by angiography.   Recommendations   Antiplatelet/Anticoag No indication for antiplatelet therapy at this time .    Surgeon Notes       11/24/2020  1:30 PM CV Procedure signed by Belva Crome, MD    Procedural Details   Technical Details The right radial area was sterilely prepped and draped. Intravenous sedation with Versed and fentanyl was administered. 1% Xylocaine was infiltrated to achieve local analgesia. Using real-time vascular ultrasound, a double wall stick with an angiocath was utilized to obtain intra-arterial access. A VUS image was saved for the permanent record.The modified Seldinger technique was used to place a 74F " Slender" sheath in the right radial artery. Weight based heparin was administered. Coronary angiography was done using 5 F catheters. Right coronary angiography was performed with a JR4. Left ventricular hemodymic recordings and angiography was done using the JR 4 catheter and hand injection. Left coronary angiography was performed with a JL 3.5 cm.  A 5 French angled pigtail catheter was used to perform supravalvular aortic angiography using 16 cc of contrast for a total of 45 cc.  Moderate to severe aortic regurgitation was identified based upon contrast near the LV apex after the first beat following root injection.  Right heart catheterization was performed by exchanging a previously placed antecubital IV angio-cath for a 5 French Slender sheath. 1% Xylocaine was used to locally anesthetize the area around the IV site. The IV catheter was wired using an .018 guidewire. The modified Seldinger technique was used to place the 5 [place] sheath. Double glove technique was used to enhance sterility. After sheath insertion, right heart cath was performed using a 5 French balloon tipped catheter and fluoroscopic  guidance. Pressures were recorded in each chamber and in the pulmonary capillary wedge position.. The main pulmonary artery O2 saturation was sampled.   Hemostasis was achieved using a pneumatic band.  During this procedure the patient is administered a total of Versed 1.5 mg and Fentanyl 75 mcg to achieve and maintain moderate conscious sedation.  The patient's heart rate, blood pressure, and oxygen saturation are monitored continuously during the procedure. The period of conscious sedation is 44 minutes, of which I was present face-to-face 100% of this time.  Estimated blood loss <50 mL.   During this procedure medications were administered to achieve and maintain moderate conscious sedation while the patient's heart rate, blood pressure, and oxygen saturation were continuously monitored and I was present face-to-face 100% of this time.    Medications (Filter: Administrations occurring from 1152 to 1319 on 11/24/20)  important  Continuous medications are totaled by the amount administered until 11/24/20 1319.    Heparin (Porcine) in NaCl 1000-0.9 UT/500ML-% SOLN (mL) Total volume:  1,000 mL Date/Time Rate/Dose/Volume Action    11/24/20 1210  500 mL Given    1211 500 mL Given      midazolam (VERSED) injection (mg) Total dose:  1.5 mg Date/Time Rate/Dose/Volume Action    11/24/20 1215 1 mg Given    1223 0.5 mg Given      fentaNYL (SUBLIMAZE) injection (mcg) Total dose:  75 mcg Date/Time Rate/Dose/Volume Action    11/24/20 1216 50 mcg Given    1223 25 mcg Given      lidocaine (PF) (XYLOCAINE) 1 % injection (mL) Total volume:  4 mL Date/Time Rate/Dose/Volume Action    11/24/20 1223 2 mL Given    1223 2 mL Given      heparin sodium (porcine) injection (Units) Total dose:  6,500 Units Date/Time Rate/Dose/Volume Action    11/24/20 1234 6,500 Units Given      iohexol (OMNIPAQUE) 350 MG/ML injection (mL) Total volume:  120 mL Date/Time Rate/Dose/Volume Action    11/24/20 1307 120  mL Given      Sedation Time   Sedation Time Physician-1: 44 minutes 8 seconds Contrast   Medication Name Total Dose  iohexol (OMNIPAQUE) 350 MG/ML injection 120 mL    Radiation/Fluoro   Fluoro time: 9.3 (min) DAP: 72951 (mGycm2) Cumulative Air Kerma: 8657 (mGy) Coronary Findings   Diagnostic Dominance: Right Right Coronary Artery  Ost RCA lesion is 25% stenosed.    Intervention    No interventions have been documented.    Right Heart   Right Heart Pressures Hemodynamic findings consistent with mild pulmonary hypertension. Elevated LV EDP consistent with volume overload.  Right Atrium Right atrial pressure is normal.    Left Heart   Left Ventricle The left ventricular systolic function is normal. LV end diastolic pressure is moderately elevated. The left ventricular ejection fraction is 50-55% by visual estimate. No regional wall motion abnormalities.  Aortic Valve There is mild aortic valve stenosis. There is moderate (3+) aortic regurgitation. The aortic valve is calcified.  Aorta Aortic Root:  The aortic root displays dilation. There is moderate (3+) aortic regurgitation. There is a mild gradient across the aortic valve.    Coronary Diagrams   Diagnostic Dominance: Right Intervention   Implants      No implant documentation for this case.    Syngo Images    Show images for CARDIAC CATHETERIZATION Images on [employer]    Show images for Claude, [place] to Procedure Log   Procedure Log    Hemo Data   Flowsheet Row Most Recent Value  Fick Cardiac Output 8.01 L/min  Fick Cardiac Output Index 3.04 (L/min)/BSA  RA A Wave 12 mmHg  RA V Wave 6 mmHg  RA Mean 5 mmHg  RV Systolic Pressure 59 mmHg  RV Diastolic Pressure 6 mmHg  RV EDP 11 mmHg  PA Systolic Pressure 54 mmHg  PA Diastolic Pressure 17 mmHg  PA Mean 31 mmHg  PW A Wave 29 mmHg  PW V Wave 39 mmHg  PW Mean 24 mmHg  AO Systolic Pressure 846 mmHg  AO Diastolic Pressure 60 mmHg  AO  Mean 91 mmHg  LV Systolic Pressure 962 mmHg  LV Diastolic Pressure 7 mmHg  LV EDP 34 mmHg  AOp Systolic Pressure 952 mmHg  AOp Diastolic Pressure 61 mmHg  AOp Mean Pressure 90 mmHg  LVp Systolic Pressure 841 mmHg  LVp Diastolic Pressure 4 mmHg  LVp EDP Pressure 30 mmHg  QP/QS 1  TPVR Index 10.2 HRUI  TSVR Index 29.94 HRUI  PVR SVR Ratio 0.08  TPVR/TSVR Ratio  0.34    STS Risk of Mortality : 3.2%     Impression:   This 65 year old woman has stage D, severe, symptomatic aortic insufficiency and moderate aortic stenosis with New York Heart Association class II-III symptoms of exertional shortness of breath and fatigue consistent with chronic diastolic congestive heart failure.  I personally reviewed her 2D echocardiogram, cardiac catheterization, and CTA studies.  Her echocardiogram shows a trileaflet aortic valve with severe calcification and thickening and restricted leaflet mobility.  The mean gradient is 28 mmHg with a peak gradient of 52 mmHg consistent with moderate aortic stenosis.  Visually there appears to be severe aortic insufficiency although the AI pressure half-time is only 603 ms.  Her left ventricular ejection fraction is normal.  LV diastolic dimension has increased from 4.7 cm in April to 5.5 cm in November.  LV systolic dimension has remained stable.  Cardiac catheterization showed no significant coronary disease.  There was mild pulmonary hypertension with an elevated LVEDP consistent with volume overload.  There was felt to be 3+ aortic insufficiency.  I think her aortic valve is severely diseased with at least moderate stenosis and severe insufficiency with signs of left ventricular overload.  I think it would be best to proceed with aortic valve replacement before she develops progressive left ventricular dilation and dysfunction.  She is 65 years old but with morbid obesity with a weight of 340 pounds, diabetes and stage III chronic kidney disease, and CO2 retention on  arterial blood gas suggesting obesity hypoventilation syndrome I think it is reasonable to recommend TAVR as an alternative to open surgical aortic valve replacement.  Her gated cardiac CTA shows anatomy suitable for TAVR using a SAPIEN 3 valve.  Her abdominal and pelvic CTA shows adequate pelvic vascular anatomy to allow transfemoral insertion.  I discussed the alternative of open surgical aortic valve replacement and she is really not interested in that unless that is the only option available.   The patient and her sister were counseled at length regarding treatment alternatives for management of severe symptomatic aortic stenosis. The risks and benefits of surgical intervention has been discussed in detail. Long-term prognosis with medical therapy was discussed. Alternative approaches such as conventional surgical aortic valve replacement, transcatheter aortic valve replacement, and palliative medical therapy were compared and contrasted at length. This discussion was placed in the context of the patient's own specific clinical presentation and past medical history. All of their questions have been addressed.    Following the decision to proceed with transcatheter aortic valve replacement, a discussion was held regarding what types of management strategies would be attempted intraoperatively in the event of life-threatening complications, including whether or not the patient would be considered a candidate for the use of cardiopulmonary bypass and/or conversion to open sternotomy for attempted surgical intervention. I think she would be a candidate for emergent sternotomy to manage any intraoperative complications. The patient is aware of the fact that transient use of cardiopulmonary bypass may be necessary. The patient has been advised of a variety of complications that might develop including but not limited to risks of death, stroke, paravalvular leak, aortic dissection or other major vascular  complications, aortic annulus rupture, device embolization, cardiac rupture or perforation, mitral regurgitation, acute myocardial infarction, arrhythmia, heart block or bradycardia requiring permanent pacemaker placement, congestive heart failure, respiratory failure, renal failure, pneumonia, infection, other late complications related to structural valve deterioration or migration, or other complications that might ultimately cause a temporary or permanent loss of functional  independence or other long term morbidity. The patient provides full informed consent for the procedure as described and all questions were answered.       Plan:   Transfemoral TAVR.     Gaye Pollack, MD

## 2020-12-26 ENCOUNTER — Inpatient Hospital Stay (HOSPITAL_COMMUNITY): Payer: Medicare Other | Admitting: Certified Registered"

## 2020-12-26 ENCOUNTER — Other Ambulatory Visit: Payer: Self-pay

## 2020-12-26 ENCOUNTER — Inpatient Hospital Stay (HOSPITAL_COMMUNITY)
Admission: RE | Admit: 2020-12-26 | Discharge: 2020-12-26 | Disposition: A | Discharge status: Home / Self Care | Payer: Medicare Other | Source: Home / Self Care | Attending: Internal Medicine | Admitting: Internal Medicine

## 2020-12-26 ENCOUNTER — Encounter (HOSPITAL_COMMUNITY): Payer: Self-pay | Admitting: Internal Medicine

## 2020-12-26 ENCOUNTER — Encounter (HOSPITAL_COMMUNITY)
Admission: RE | Disposition: A | Discharge status: Home / Self Care | Payer: Self-pay | Source: Home / Self Care | Attending: Internal Medicine

## 2020-12-26 ENCOUNTER — Inpatient Hospital Stay (HOSPITAL_COMMUNITY)
Admission: RE | Admit: 2020-12-26 | Discharge: 2020-12-27 | DRG: 267 | Disposition: A | Discharge status: Home / Self Care | Payer: Medicare Other | Attending: Internal Medicine | Admitting: Internal Medicine

## 2020-12-26 ENCOUNTER — Inpatient Hospital Stay (HOSPITAL_COMMUNITY): Payer: Medicare Other | Admitting: Vascular Surgery

## 2020-12-26 DIAGNOSIS — Z87891 Personal history of nicotine dependence: Secondary | ICD-10-CM

## 2020-12-26 DIAGNOSIS — I352 Nonrheumatic aortic (valve) stenosis with insufficiency: Secondary | ICD-10-CM | POA: Diagnosis present

## 2020-12-26 DIAGNOSIS — J449 Chronic obstructive pulmonary disease, unspecified: Secondary | ICD-10-CM

## 2020-12-26 DIAGNOSIS — Z6841 Body Mass Index (BMI) 40.0 and over, adult: Secondary | ICD-10-CM

## 2020-12-26 DIAGNOSIS — N183 Chronic kidney disease, stage 3 unspecified: Secondary | ICD-10-CM | POA: Diagnosis present

## 2020-12-26 DIAGNOSIS — R509 Fever, unspecified: Secondary | ICD-10-CM | POA: Diagnosis present

## 2020-12-26 DIAGNOSIS — Z952 Presence of prosthetic heart valve: Secondary | ICD-10-CM

## 2020-12-26 DIAGNOSIS — E1122 Type 2 diabetes mellitus with diabetic chronic kidney disease: Secondary | ICD-10-CM | POA: Diagnosis present

## 2020-12-26 DIAGNOSIS — E7800 Pure hypercholesterolemia, unspecified: Secondary | ICD-10-CM | POA: Diagnosis present

## 2020-12-26 DIAGNOSIS — Z8249 Family history of ischemic heart disease and other diseases of the circulatory system: Secondary | ICD-10-CM

## 2020-12-26 DIAGNOSIS — Z006 Encounter for examination for normal comparison and control in clinical research program: Secondary | ICD-10-CM

## 2020-12-26 DIAGNOSIS — I340 Nonrheumatic mitral (valve) insufficiency: Secondary | ICD-10-CM | POA: Diagnosis present

## 2020-12-26 DIAGNOSIS — N1832 Chronic kidney disease, stage 3b: Secondary | ICD-10-CM | POA: Diagnosis present

## 2020-12-26 DIAGNOSIS — I2510 Atherosclerotic heart disease of native coronary artery without angina pectoris: Secondary | ICD-10-CM | POA: Diagnosis present

## 2020-12-26 DIAGNOSIS — Z794 Long term (current) use of insulin: Secondary | ICD-10-CM | POA: Diagnosis not present

## 2020-12-26 DIAGNOSIS — E785 Hyperlipidemia, unspecified: Secondary | ICD-10-CM | POA: Diagnosis present

## 2020-12-26 DIAGNOSIS — I359 Nonrheumatic aortic valve disorder, unspecified: Secondary | ICD-10-CM

## 2020-12-26 DIAGNOSIS — I129 Hypertensive chronic kidney disease with stage 1 through stage 4 chronic kidney disease, or unspecified chronic kidney disease: Secondary | ICD-10-CM | POA: Diagnosis present

## 2020-12-26 DIAGNOSIS — E1165 Type 2 diabetes mellitus with hyperglycemia: Secondary | ICD-10-CM

## 2020-12-26 DIAGNOSIS — I959 Hypotension, unspecified: Secondary | ICD-10-CM | POA: Diagnosis present

## 2020-12-26 DIAGNOSIS — I350 Nonrheumatic aortic (valve) stenosis: Secondary | ICD-10-CM

## 2020-12-26 DIAGNOSIS — Z79899 Other long term (current) drug therapy: Secondary | ICD-10-CM | POA: Diagnosis not present

## 2020-12-26 DIAGNOSIS — I351 Nonrheumatic aortic (valve) insufficiency: Secondary | ICD-10-CM | POA: Diagnosis not present

## 2020-12-26 DIAGNOSIS — R001 Bradycardia, unspecified: Secondary | ICD-10-CM

## 2020-12-26 DIAGNOSIS — E119 Type 2 diabetes mellitus without complications: Secondary | ICD-10-CM

## 2020-12-26 DIAGNOSIS — I10 Essential (primary) hypertension: Secondary | ICD-10-CM | POA: Diagnosis present

## 2020-12-26 DIAGNOSIS — I272 Pulmonary hypertension, unspecified: Secondary | ICD-10-CM | POA: Diagnosis present

## 2020-12-26 DIAGNOSIS — E669 Obesity, unspecified: Secondary | ICD-10-CM

## 2020-12-26 DIAGNOSIS — E1169 Type 2 diabetes mellitus with other specified complication: Secondary | ICD-10-CM

## 2020-12-26 HISTORY — DX: Chronic obstructive pulmonary disease, unspecified: J44.9

## 2020-12-26 HISTORY — DX: Nonrheumatic aortic valve disorder, unspecified: I35.9

## 2020-12-26 HISTORY — PX: TRANSCATHETER AORTIC VALVE REPLACEMENT, TRANSFEMORAL: SHX6400

## 2020-12-26 HISTORY — PX: INTRAOPERATIVE TRANSTHORACIC ECHOCARDIOGRAM: SHX6523

## 2020-12-26 HISTORY — DX: Presence of prosthetic heart valve: Z95.2

## 2020-12-26 HISTORY — DX: Morbid (severe) obesity due to excess calories: E66.01

## 2020-12-26 LAB — ECHOCARDIOGRAM LIMITED
AR max vel: 2.52 cm2
AV Area VTI: 2.58 cm2
AV Area mean vel: 2.62 cm2
AV Mean grad: 7 mmHg
AV Peak grad: 11.4 mmHg
Ao pk vel: 1.69 m/s
P 1/2 time: 271 msec

## 2020-12-26 LAB — POCT I-STAT, CHEM 8
BUN: 15 mg/dL (ref 8–23)
Calcium, Ion: 1.25 mmol/L (ref 1.15–1.40)
Chloride: 107 mmol/L (ref 98–111)
Creatinine, Ser: 1 mg/dL (ref 0.44–1.00)
Glucose, Bld: 145 mg/dL — ABNORMAL HIGH (ref 70–99)
HCT: 30 % — ABNORMAL LOW (ref 36.0–46.0)
Hemoglobin: 10.2 g/dL — ABNORMAL LOW (ref 12.0–15.0)
Potassium: 4 mmol/L (ref 3.5–5.1)
Sodium: 143 mmol/L (ref 135–145)
TCO2: 25 mmol/L (ref 22–32)

## 2020-12-26 LAB — GLUCOSE, CAPILLARY
Glucose-Capillary: 102 mg/dL — ABNORMAL HIGH (ref 70–99)
Glucose-Capillary: 109 mg/dL — ABNORMAL HIGH (ref 70–99)
Glucose-Capillary: 119 mg/dL — ABNORMAL HIGH (ref 70–99)
Glucose-Capillary: 90 mg/dL (ref 70–99)

## 2020-12-26 LAB — POCT ACTIVATED CLOTTING TIME
Activated Clotting Time: 131 seconds
Activated Clotting Time: 131 seconds
Activated Clotting Time: 371 seconds

## 2020-12-26 LAB — MRSA NEXT GEN BY PCR, NASAL: MRSA by PCR Next Gen: NOT DETECTED

## 2020-12-26 LAB — ABO/RH: ABO/RH(D): O POS

## 2020-12-26 SURGERY — IMPLANTATION, AORTIC VALVE, TRANSCATHETER, FEMORAL APPROACH
Anesthesia: General

## 2020-12-26 MED ORDER — PROTAMINE SULFATE 10 MG/ML IV SOLN
INTRAVENOUS | Status: DC | PRN
Start: 2020-12-26 — End: 2020-12-26
  Administered 2020-12-26: 270 mg via INTRAVENOUS

## 2020-12-26 MED ORDER — PROPOFOL 500 MG/50ML IV EMUL
INTRAVENOUS | Status: DC | PRN
  Administered 2020-12-26: 25 ug/kg/min via INTRAVENOUS
  Administered 2020-12-26: 20 mg via INTRAVENOUS

## 2020-12-26 MED ORDER — ACETAMINOPHEN 325 MG PO TABS
650.0000 mg | ORAL_TABLET | Freq: Four times a day (QID) | ORAL | Status: DC | PRN
  Administered 2020-12-27 (×2): 650 mg via ORAL
  Filled 2020-12-26 (×2): qty 2

## 2020-12-26 MED ORDER — SODIUM CHLORIDE 0.9 % IV SOLN: INTRAVENOUS | Status: AC

## 2020-12-26 MED ORDER — ATORVASTATIN CALCIUM 10 MG PO TABS
10.0000 mg | ORAL_TABLET | Freq: Every day | ORAL | Status: DC
  Administered 2020-12-26 – 2020-12-27 (×2): 10 mg via ORAL
  Filled 2020-12-26 (×2): qty 1

## 2020-12-26 MED ORDER — LACTATED RINGERS IV SOLN: INTRAVENOUS | Status: DC

## 2020-12-26 MED ORDER — ACETAMINOPHEN 650 MG RE SUPP: 650.0000 mg | Freq: Four times a day (QID) | RECTAL | Status: DC | PRN

## 2020-12-26 MED ORDER — ASPIRIN 81 MG PO CHEW
81.0000 mg | CHEWABLE_TABLET | Freq: Every day | ORAL | Status: DC
  Administered 2020-12-27: 10:00:00 81 mg via ORAL
  Filled 2020-12-26: qty 1

## 2020-12-26 MED ORDER — HEPARIN (PORCINE) IN NACL 2000-0.9 UNIT/L-% IV SOLN
INTRAVENOUS | Status: DC | PRN
  Administered 2020-12-26 (×2): 1000 mL

## 2020-12-26 MED ORDER — ONDANSETRON HCL 4 MG/2ML IJ SOLN: 4.0000 mg | Freq: Four times a day (QID) | INTRAMUSCULAR | Status: DC | PRN

## 2020-12-26 MED ORDER — INSULIN ASPART 100 UNIT/ML IJ SOLN
0.0000 [IU] | INTRAMUSCULAR | Status: DC
  Administered 2020-12-27: 07:00:00 2 [IU] via SUBCUTANEOUS

## 2020-12-26 MED ORDER — SODIUM CHLORIDE 0.9% FLUSH
3.0000 mL | Freq: Two times a day (BID) | INTRAVENOUS | Status: DC
  Administered 2020-12-26 – 2020-12-27 (×2): 3 mL via INTRAVENOUS

## 2020-12-26 MED ORDER — MIDAZOLAM HCL 2 MG/2ML IJ SOLN
INTRAMUSCULAR | Status: DC | PRN
  Administered 2020-12-26 (×2): 1 mg via INTRAVENOUS

## 2020-12-26 MED ORDER — CHLORHEXIDINE GLUCONATE 4 % EX LIQD: 60.0000 mL | Freq: Once | CUTANEOUS | Status: DC

## 2020-12-26 MED ORDER — ONDANSETRON HCL 4 MG/2ML IJ SOLN
INTRAMUSCULAR | Status: DC | PRN
  Administered 2020-12-26: 4 mg via INTRAVENOUS

## 2020-12-26 MED ORDER — LIDOCAINE HCL (PF) 1 % IJ SOLN
INTRAMUSCULAR | Status: AC
  Filled 2020-12-26: qty 30

## 2020-12-26 MED ORDER — OXYCODONE HCL 5 MG PO TABS: 5.0000 mg | ORAL_TABLET | ORAL | Status: DC | PRN

## 2020-12-26 MED ORDER — CHLORHEXIDINE GLUCONATE CLOTH 2 % EX PADS
6.0000 | MEDICATED_PAD | Freq: Every day | CUTANEOUS | Status: DC
  Administered 2020-12-26 – 2020-12-27 (×2): 6 via TOPICAL

## 2020-12-26 MED ORDER — CHLORHEXIDINE GLUCONATE 0.12 % MT SOLN: 15.0000 mL | Freq: Once | OROMUCOSAL | Status: AC

## 2020-12-26 MED ORDER — CEFAZOLIN SODIUM-DEXTROSE 2-4 GM/100ML-% IV SOLN
2.0000 g | Freq: Three times a day (TID) | INTRAVENOUS | Status: AC
  Administered 2020-12-26 (×2): 2 g via INTRAVENOUS
  Filled 2020-12-26 (×2): qty 100

## 2020-12-26 MED ORDER — SODIUM CHLORIDE 0.9 % IV SOLN: INTRAVENOUS | Status: DC

## 2020-12-26 MED ORDER — IOHEXOL 350 MG/ML SOLN
INTRAVENOUS | Status: DC | PRN
  Administered 2020-12-26: 100 mL

## 2020-12-26 MED ORDER — LIDOCAINE HCL (PF) 1 % IJ SOLN
INTRAMUSCULAR | Status: DC | PRN
  Administered 2020-12-26 (×2): 5 mL via INTRADERMAL

## 2020-12-26 MED ORDER — CHLORHEXIDINE GLUCONATE 4 % EX LIQD: 30.0000 mL | CUTANEOUS | Status: DC

## 2020-12-26 MED ORDER — HEPARIN (PORCINE) IN NACL 2000-0.9 UNIT/L-% IV SOLN
INTRAVENOUS | Status: AC
  Filled 2020-12-26: qty 2000

## 2020-12-26 MED ORDER — SODIUM CHLORIDE 0.9% FLUSH: 3.0000 mL | INTRAVENOUS | Status: DC | PRN

## 2020-12-26 MED ORDER — HEPARIN (PORCINE) IN NACL 1000-0.9 UT/500ML-% IV SOLN: INTRAVENOUS | Status: DC | PRN

## 2020-12-26 MED ORDER — TRAMADOL HCL 50 MG PO TABS: 50.0000 mg | ORAL_TABLET | ORAL | Status: DC | PRN

## 2020-12-26 MED ORDER — CHLORHEXIDINE GLUCONATE 0.12 % MT SOLN
OROMUCOSAL | Status: AC
  Administered 2020-12-26: 15 mL via OROMUCOSAL
  Filled 2020-12-26: qty 15

## 2020-12-26 MED ORDER — MORPHINE SULFATE (PF) 2 MG/ML IV SOLN: 1.0000 mg | INTRAVENOUS | Status: DC | PRN

## 2020-12-26 MED ORDER — CHLORHEXIDINE GLUCONATE 4 % EX LIQD
60.0000 mL | Freq: Once | CUTANEOUS | Status: DC
  Administered 2020-12-26: 4 via TOPICAL

## 2020-12-26 MED ORDER — DOPAMINE-DEXTROSE 3.2-5 MG/ML-% IV SOLN
0.0000 ug/kg/min | INTRAVENOUS | Status: DC
Start: 2020-12-26 — End: 2020-12-27

## 2020-12-26 MED ORDER — NITROGLYCERIN IN D5W 200-5 MCG/ML-% IV SOLN
0.0000 ug/min | INTRAVENOUS | Status: DC
  Administered 2020-12-27: 03:00:00 5 ug/min via INTRAVENOUS
  Filled 2020-12-26: qty 250

## 2020-12-26 MED ORDER — HEPARIN SODIUM (PORCINE) 1000 UNIT/ML IJ SOLN
INTRAMUSCULAR | Status: DC | PRN
  Administered 2020-12-26: 27000 [IU] via INTRAVENOUS

## 2020-12-26 MED ORDER — SODIUM CHLORIDE 0.9 % IV SOLN: 250.0000 mL | INTRAVENOUS | Status: DC | PRN

## 2020-12-26 MED ORDER — SODIUM CHLORIDE 0.9 % IV SOLN
250.0000 mL | INTRAVENOUS | Status: DC
  Administered 2020-12-26: 250 mL via INTRAVENOUS

## 2020-12-26 MED ORDER — FENTANYL CITRATE (PF) 250 MCG/5ML IJ SOLN
INTRAMUSCULAR | Status: DC | PRN
  Administered 2020-12-26 (×3): 50 ug via INTRAVENOUS

## 2020-12-26 SURGICAL SUPPLY — 37 items
BAG SNAP BAND KOVER 36X36 (MISCELLANEOUS) ×6 IMPLANT
BLANKET WARM UNDERBOD FULL ACC (MISCELLANEOUS) ×3 IMPLANT
CABLE ADAPT PACING TEMP 12FT (ADAPTER) ×2 IMPLANT
CABLE SURGICAL S-101-97-12 (CABLE) ×2 IMPLANT
CATH 26 ULTRA DELIVERY (CATHETERS) ×2 IMPLANT
CATH INFINITI 5FR AL1 (CATHETERS) ×2 IMPLANT
CATH INFINITI 5FR ANG PIGTAIL (CATHETERS) ×4 IMPLANT
CATH S G BIP PACING (CATHETERS) ×2 IMPLANT
CLOSURE MYNX CONTROL 6F/7F (Vascular Products) ×2 IMPLANT
CLOSURE PERCLOSE PROSTYLE (VASCULAR PRODUCTS) ×4 IMPLANT
CRIMPER (MISCELLANEOUS) ×2 IMPLANT
DEVICE INFLATION ATRION QL2530 (MISCELLANEOUS) ×2 IMPLANT
ELECT DEFIB PAD ADLT CADENCE (PAD) ×2 IMPLANT
GUIDEWIRE CNFDA BRKR CVD (WIRE) ×4 IMPLANT
GUIDEWIRE INQWIRE 1.5J.035X260 (WIRE) IMPLANT
INQWIRE 1.5J .035X260CM (WIRE) ×3
KIT HEART LEFT (KITS) ×3 IMPLANT
KIT SAPIAN 3 ULTRA RESILIA 26 (Valve) ×2 IMPLANT
PACK CARDIAC CATHETERIZATION (CUSTOM PROCEDURE TRAY) ×3 IMPLANT
PINNACLE LONG 6F 25CM (SHEATH) ×3
SHEATH BRITE TIP 7FR 35CM (SHEATH) ×2 IMPLANT
SHEATH INTRO PINNACLE 6F 25CM (SHEATH) IMPLANT
SHEATH INTRODUCER SET 20-26 (SHEATH) ×2 IMPLANT
SHEATH PINNACLE 6F 10CM (SHEATH) ×2 IMPLANT
SHEATH PINNACLE 8F 10CM (SHEATH) ×2 IMPLANT
SLEEVE REPOSITIONING LENGTH 30 (MISCELLANEOUS) ×2 IMPLANT
STOPCOCK MORSE 400PSI 3WAY (MISCELLANEOUS) ×6 IMPLANT
SYR MEDRAD MARK V 150ML (SYRINGE) ×2 IMPLANT
TRANSDUCER W/STOPCOCK (MISCELLANEOUS) ×6 IMPLANT
TUBING ART PRESS 72  MALE/FEM (TUBING) ×2
TUBING ART PRESS 72 MALE/FEM (TUBING) IMPLANT
TUBING CIL FLEX 10 FLL-RA (TUBING) ×4 IMPLANT
TUBING CONTRAST HIGH PRESS 48 (TUBING) ×2 IMPLANT
WIRE AMPLATZ SS-J .035X180CM (WIRE) ×2 IMPLANT
WIRE EMERALD 3MM-J .035X150CM (WIRE) ×2 IMPLANT
WIRE EMERALD ST .035X260CM (WIRE) ×4 IMPLANT
WIRE MICRO SET 5FR 12 (WIRE) ×2 IMPLANT

## 2020-12-26 NOTE — Progress Notes (Signed)
°  [facility] VALVE TEAM  Patient doing well s/p TAVR. She is hemodynamically stable. Groin sites stable. ECG with sinus bradycardia but no high grade block. Arterial line discontinued and transferred to 4E. Plan for early ambulation after bedrest completed and hopeful discharge over the next 24-48 hours.   Angelena Form PA-C  [facility]  Pager [phone]

## 2020-12-26 NOTE — Progress Notes (Addendum)
°  [facility]   MULTIDISCIPLINARY HEART VALVE TEAM  Patient up and talking with family. HR sinus in the low 30s. She was noted to have a 4.5 second pause but now associated LOC. BP is low but stable 101/58. D/W Dr. Cyndia Bent and want to transfer to cardiac ICU. Start on a DA gtt to support HR and BP.   Angelena Form PA-C  [facility]

## 2020-12-26 NOTE — Anesthesia Procedure Notes (Signed)
Arterial Line Insertion Start/End12/13/2022 8:00 AM, 12/26/2020 8:20 AM Performed by: Moshe Salisbury, CRNA, CRNA  Patient location: Pre-op. Preanesthetic checklist: patient identified, IV checked, site marked, risks and benefits discussed, surgical consent, monitors and equipment checked, pre-op evaluation, timeout performed and anesthesia consent Lidocaine 1% used for infiltration Right, radial was placed Catheter size: 20 G Hand hygiene performed , maximum sterile barriers used  and Seldinger technique used  Attempts: 2 Procedure performed using ultrasound guided technique. Ultrasound Notes:anatomy identified, needle tip was noted to be adjacent to the nerve/plexus identified and no ultrasound evidence of intravascular and/or intraneural injection Following insertion, dressing applied and Biopatch. Post procedure assessment: normal  Patient tolerated the procedure well with no immediate complications.

## 2020-12-26 NOTE — Op Note (Signed)
[facility]   MULTIDISCIPLINARY HEART VALVE TEAM   TAVR OPERATIVE NOTE   Date of Procedure:  12/26/2020  Preoperative Diagnosis: Severe Aortic Stenosis   Postoperative Diagnosis: Same   Procedure:   Transcatheter Aortic Valve Replacement - Percutaneous Right Transfemoral Approach  Edwards Sapien 3 Ultra Resilia THV (size 26 mm, model # 9755RSL, serial # [id number])   Co-Surgeons:  Gaye Pollack, MD and Lenna Sciara, MD   Anesthesiologist:  C. Nyoka Cowden, MD  Echocardiographer:  Jerilynn Mages. Croitoru, MD  Pre-operative Echo Findings: Severe aortic stenosis Normal left ventricular systolic function  Post-operative Echo Findings: No paravalvular leak Normal left ventricular systolic function   BRIEF CLINICAL NOTE AND INDICATIONS FOR SURGERY  This 65 year old woman has stage D, severe, symptomatic aortic insufficiency and moderate aortic stenosis with New York Heart Association class II-III symptoms of exertional shortness of breath and fatigue consistent with chronic diastolic congestive heart failure.  I personally reviewed her 2D echocardiogram, cardiac catheterization, and CTA studies.  Her echocardiogram shows a trileaflet aortic valve with severe calcification and thickening and restricted leaflet mobility.  The mean gradient is 28 mmHg with a peak gradient of 52 mmHg consistent with moderate aortic stenosis.  Visually there appears to be severe aortic insufficiency although the AI pressure half-time is only 603 ms.  Her left ventricular ejection fraction is normal.  LV diastolic dimension has increased from 4.7 cm in April to 5.5 cm in November.  LV systolic dimension has remained stable.  Cardiac catheterization showed no significant coronary disease.  There was mild pulmonary hypertension with an elevated LVEDP consistent with volume overload.  There was felt to be 3+ aortic insufficiency.  I think her aortic valve is severely diseased with at least moderate stenosis and  severe insufficiency with signs of left ventricular overload.  I think it would be best to proceed with aortic valve replacement before she develops progressive left ventricular dilation and dysfunction.  She is 65 years old but with morbid obesity with a weight of 340 pounds, diabetes and stage III chronic kidney disease, and CO2 retention on arterial blood gas suggesting obesity hypoventilation syndrome I think it is reasonable to recommend TAVR as an alternative to open surgical aortic valve replacement.  Her gated cardiac CTA shows anatomy suitable for TAVR using a SAPIEN 3 valve.  Her abdominal and pelvic CTA shows adequate pelvic vascular anatomy to allow transfemoral insertion.  I discussed the alternative of open surgical aortic valve replacement and she is really not interested in that unless that is the only option available.   The patient and her sister were counseled at length regarding treatment alternatives for management of severe symptomatic aortic stenosis. The risks and benefits of surgical intervention has been discussed in detail. Long-term prognosis with medical therapy was discussed. Alternative approaches such as conventional surgical aortic valve replacement, transcatheter aortic valve replacement, and palliative medical therapy were compared and contrasted at length. This discussion was placed in the context of the patient's own specific clinical presentation and past medical history. All of their questions have been addressed.    Following the decision to proceed with transcatheter aortic valve replacement, a discussion was held regarding what types of management strategies would be attempted intraoperatively in the event of life-threatening complications, including whether or not the patient would be considered a candidate for the use of cardiopulmonary bypass and/or conversion to open sternotomy for attempted surgical intervention. I think she would be a candidate for emergent  sternotomy to manage any  intraoperative complications. The patient is aware of the fact that transient use of cardiopulmonary bypass may be necessary. The patient has been advised of a variety of complications that might develop including but not limited to risks of death, stroke, paravalvular leak, aortic dissection or other major vascular complications, aortic annulus rupture, device embolization, cardiac rupture or perforation, mitral regurgitation, acute myocardial infarction, arrhythmia, heart block or bradycardia requiring permanent pacemaker placement, congestive heart failure, respiratory failure, renal failure, pneumonia, infection, other late complications related to structural valve deterioration or migration, or other complications that might ultimately cause a temporary or permanent loss of functional independence or other long term morbidity. The patient provides full informed consent for the procedure as described and all questions were answered.        DETAILS OF THE OPERATIVE PROCEDURE  PREPARATION:    The patient was brought to the operating room on the above mentioned date and appropriate monitoring was established by the anesthesia team. The patient was placed in the supine position on the operating table.  Intravenous antibiotics were administered. The patient was monitored closely throughout the procedure under conscious sedation.   Baseline transthoracic echocardiogram was performed. The patient's abdomen and both groins were prepped and draped in a sterile manner. A time out procedure was performed.   PERIPHERAL ACCESS:    Using the modified Seldinger technique, femoral arterial and venous access was obtained with placement of 6 Fr sheaths on the left side.  A pigtail diagnostic catheter was passed through the left arterial sheath under fluoroscopic guidance into the aortic root.  A temporary transvenous pacemaker catheter was passed through the left femoral venous sheath  under fluoroscopic guidance into the right ventricle.  The pacemaker was tested to ensure stable lead placement and pacemaker capture. Aortic root angiography was performed in order to determine the optimal angiographic angle for valve deployment.   TRANSFEMORAL ACCESS:   Percutaneous transfemoral access and sheath placement was performed using ultrasound guidance.  The right common femoral artery was cannulated using a micropuncture needle and appropriate location was verified using hand injection angiogram.  A pair of Abbott Perclose percutaneous closure devices were placed and a 6 French sheath replaced into the femoral artery.  The patient was heparinized systemically and ACT verified > 250 seconds.    A 14 Fr transfemoral E-sheath was introduced into the right common femoral artery after progressively dilating over an Amplatz superstiff wire. An AL-1 catheter was used to direct a straight-tip exchange length wire across the native aortic valve into the left ventricle. This was exchanged out for a pigtail catheter and position was confirmed in the LV apex. Simultaneous LV and Ao pressures were recorded.  The pigtail catheter was exchanged for an Amplatz Extra-stiff wire in the LV apex.    BALLOON AORTIC VALVULOPLASTY:   Not performed    TRANSCATHETER HEART VALVE DEPLOYMENT:   An Edwards Sapien 3 Ultra transcatheter heart valve (size 26 mm) was prepared and crimped per manufacturer's guidelines, and the proper orientation of the valve is confirmed on the [employer] delivery system. The valve was advanced through the introducer sheath using normal technique until in an appropriate position in the abdominal aorta beyond the sheath tip. The balloon was then retracted and using the fine-tuning wheel was centered on the valve. The valve was then advanced across the aortic arch using appropriate flexion of the catheter. The valve was carefully positioned across the aortic valve annulus. The  Commander catheter was retracted using normal technique. Once  final position of the valve has been confirmed by angiographic assessment, the valve is deployed while temporarily holding ventilation and during rapid ventricular pacing to maintain systolic blood pressure < 50 mmHg and pulse pressure < 10 mmHg. The balloon inflation is held for >3 seconds after reaching full deployment volume. Once the balloon has fully deflated the balloon is retracted into the ascending aorta and valve function is assessed using echocardiography. There is felt to be no paravalvular leak and no central aortic insufficiency.  The patient's hemodynamic recovery following valve deployment is good.  The deployment balloon and guidewire are both removed.    PROCEDURE COMPLETION:   The sheath was removed and femoral artery closure performed.  Protamine was administered once femoral arterial repair was complete. The temporary pacemaker, pigtail catheters and femoral sheaths were removed with manual pressure used for hemostasis.  A Mynx femoral closure device was utilized following removal of the diagnostic sheath in the left femoral artery.  The patient tolerated the procedure well and is transported to the cath lab recovery area in stable condition. There were no immediate intraoperative complications. All sponge instrument and needle counts are verified correct at completion of the operation.   No blood products were administered during the operation.  The patient received a total of 100 mL of intravenous contrast during the procedure.   Gaye Pollack, MD 12/26/2020

## 2020-12-26 NOTE — Progress Notes (Signed)
An USGPIV (ultrasound guided PIV) has been placed for short-term vasopressor infusion. A correctly placed ivWatch must be used when administering Vasopressors. Should this treatment be needed beyond 72 hours, central line access should be obtained.  It will be the responsibility of the bedside nurse to follow best practice to prevent extravasations. This site is Proximal Left FA. RN Yvone Neu aware.

## 2020-12-26 NOTE — Op Note (Signed)
[facility]  [facility]AVR OPERATIVE NOTE   Date of Procedure:  12/26/2020  Preoperative Diagnosis: Moderate to Severe Aortic Stenosis and Severe Aortic Insufficiency   Postoperative Diagnosis: Same   Procedure:   Transcatheter Aortic Valve Replacement - Transfemoral Approach  Edwards Sapien 3 Resilia THV (size 29m, model # 9755RLS, serial # [id number]   Co-Surgeons:   BGaye Pollack MD and ALenna Sciara MD Anesthesiologist:  CBelinda Block MD  Echocardiographer:  MSanda Klein MD  Pre-operative Echo Findings: Severe aortic stenosis Normal left ventricular systolic function  Post-operative Echo Findings: No paravalvular leak Normal left ventricular systolic function  BRIEF CLINICAL NOTE AND INDICATIONS FOR SURGERY  Patient is a 65year old African-American female with history of severe aortic valve moderate to severe aortic stenosis and severe aortic insufficiency, coronary artery disease hyperlipidemia, morbid obesity who was referred by Dr. HDaneen Schickdue to symptomatic aortic valvular disease.  During the course of the patient's preoperative work up they have been evaluated comprehensively by a multidisciplinary team of specialists [facility]  They have been demonstrated to suffer from symptomatic severe aortic stenosis as noted above. The patient has been counseled extensively as to the relative risks and benefits of all options for the treatment of severe aortic stenosis including long term medical therapy, conventional surgery for aortic valve replacement, and transcatheter aortic valve replacement.  The patient has been independently evaluated by Dr. BCyndia Bentwith CT surgery and they are felt to be at high risk for conventional surgical aortic valve replacement. The surgeon indicated the patient would be a poor candidate for conventional surgery. Based upon review of all of  the patient's preoperative diagnostic tests they are felt to be candidate for transcatheter aortic valve replacement using the transfemoral approach as an alternative to high risk conventional surgery.    Following the decision to proceed with transcatheter aortic valve replacement, a discussion has been held regarding what types of management strategies would be attempted intraoperatively in the event of life-threatening complications, including whether or not the patient would be considered a candidate for the use of cardiopulmonary bypass and/or conversion to open sternotomy for attempted surgical intervention.  The patient has been advised of a variety of complications that might develop peculiar to this approach including but not limited to risks of death, stroke, paravalvular leak, aortic dissection or other major vascular complications, aortic annulus rupture, device embolization, cardiac rupture or perforation, acute myocardial infarction, arrhythmia, heart block or bradycardia requiring permanent pacemaker placement, congestive heart failure, respiratory failure, renal failure, pneumonia, infection, other late complications related to structural valve deterioration or migration, or other complications that might ultimately cause a temporary or permanent loss of functional independence or other long term morbidity.  The patient provides full informed consent for the procedure as described and all questions were answered preoperatively.    DETAILS OF THE OPERATIVE PROCEDURE  PREPARATION:   The patient is brought to the operating room on the above mentioned date and central monitoring was established by the anesthesia team including placement of a radial arterial line. The patient is placed in the supine position on the operating table.  Intravenous antibiotics are administered. Conscious sedation is used.   Baseline transthoracic echocardiogram was performed. The patient's chest, abdomen, both  groins, and both lower extremities are prepared and draped in a sterile manner. A time out procedure is performed.   PERIPHERAL ACCESS:   Using the modified Seldinger technique,  femoral arterial and venous access were obtained with placement of a 6 Fr sheath in the artery and a 7 Fr sheath in the vein on the left side using u/s guidance.  A pigtail diagnostic catheter was passed through the femoral arterial sheath under fluoroscopic guidance into the aortic root.  A temporary transvenous pacemaker catheter was passed through the femoral venous sheath under fluoroscopic guidance into the right ventricle.  We could not obtain positioning in the RV apex so we deferred further attempts and performed direct LV pacing.  TRANSFEMORAL ACCESS:  A micropuncture kit was used to gain access to the right femoral artery using u/s guidance. Position confirmed with angiography. Pre-closure with double ProGlide closure devices. The patient was heparinized systemically and ACT verified > 250 seconds.    A 14 Fr transfemoral E-sheath was introduced into the right femoral artery after progressively dilating over an Amplatz superstiff wire. An AL-1 catheter was used to direct a straight-tip exchange length wire across the native aortic valve into the left ventricle. This was exchanged out for a pigtail catheter and position was confirmed in the LV apex. Simultaneous LV and Ao pressures were recorded.  The pigtail catheter was then exchanged for a Confida wire in the LV apex and direct LV pacing thresholds were assessed and found to be adequate.   TRANSCATHETER HEART VALVE DEPLOYMENT:  An Edwards Sapien 3 THV (size 26 mm) was prepared and crimped per manufacturer's guidelines, and the proper orientation of the valve is confirmed on the [employer] delivery system. The valve was advanced through the introducer sheath using normal technique until in an appropriate position in the abdominal aorta beyond the sheath tip. The  balloon was then retracted and using the fine-tuning wheel was centered on the valve. The valve was then advanced across the aortic arch using appropriate flexion of the catheter. The valve was carefully positioned across the aortic valve annulus. The Commander catheter was retracted using normal technique. Once final position of the valve has been confirmed by angiographic assessment, the valve is deployed while temporarily holding ventilation and during rapid direct left ventricular pacing to maintain systolic blood pressure < 50 mmHg and pulse pressure < 10 mmHg. The balloon inflation is held for >3 seconds after reaching full deployment volume. Once the balloon has fully deflated the balloon is retracted into the ascending aorta and valve function is assessed using TTE. There is felt to be no paravalvular leak and no central aortic insufficiency.  The patient's hemodynamic recovery following valve deployment is good.  The deployment balloon and guidewire are both removed. Echo demostrated acceptable post-procedural gradients, stable mitral valve function, and no AI.   PROCEDURE COMPLETION:  The sheath was then removed and closure devices were completed. Protamine was administered once femoral arterial repair was complete. The temporary pacemaker, pigtail catheters and femoral sheaths were removed with a Mynx closure device placed in the artery and manual pressure used for venous hemostasis.    The patient tolerated the procedure well and is transported to the surgical intensive care in stable condition. There were no immediate intraoperative complications. All sponge instrument and needle counts are verified correct at completion of the operation.   No blood products were administered during the operation.  The patient received a total of 100 mL of intravenous contrast during the procedure.  Early Osmond MD 12/26/2020 11:59 AM

## 2020-12-26 NOTE — Interval H&P Note (Signed)
History and Physical Interval Note:  12/26/2020 9:28 AM  Rose Miller  has presented today for surgery, with the diagnosis of Severe Aortic Insufficiency and Moderate Aortic Stenosis.  The various methods of treatment have been discussed with the patient and family. After consideration of risks, benefits and other options for treatment, the patient has consented to  Procedure(s): TRANSCATHETER AORTIC VALVE REPLACEMENT, TRANSFEMORAL (N/A) INTRAOPERATIVE TRANSTHORACIC ECHOCARDIOGRAM (N/A) as a surgical intervention.  The patient's history has been reviewed, patient examined, no change in status, stable for surgery.  I have reviewed the patient's chart and labs.  Questions were answered to the patient's satisfaction.     Gaye Pollack

## 2020-12-26 NOTE — Anesthesia Postprocedure Evaluation (Signed)
Anesthesia Post Note  Patient: Rose Miller  Procedure(s) Performed: TRANSCATHETER AORTIC VALVE REPLACEMENT, TRANSFEMORAL INTRAOPERATIVE TRANSTHORACIC ECHOCARDIOGRAM     Patient location during evaluation: ICU Anesthesia Type: General Level of consciousness: awake Pain management: pain level controlled Vital Signs Assessment: post-procedure vital signs reviewed and stable Respiratory status: spontaneous breathing Cardiovascular status: stable Postop Assessment: no apparent nausea or vomiting Anesthetic complications: no   No notable events documented.  Last Vitals:  Vitals:   12/26/20 1330 12/26/20 1405  BP: (!) 96/51 (!) 101/58  Pulse: (!) 35 (!) 47  Resp: 13 13  Temp:  36.5 C  SpO2: 97% 100%    Last Pain:  Vitals:   12/26/20 1405  TempSrc: Oral  PainSc: 0-No pain                 Chosen Garron

## 2020-12-26 NOTE — Progress Notes (Signed)
Patient transferred to 2T80 without complication.

## 2020-12-26 NOTE — Anesthesia Procedure Notes (Signed)
Procedure Name: MAC Date/Time: 12/26/2020 9:35 AM Performed by: Moshe Salisbury, CRNA Pre-anesthesia Checklist: Patient identified, Emergency Drugs available, Suction available and Patient being monitored Patient Re-evaluated:Patient Re-evaluated prior to induction Oxygen Delivery Method: Nasal cannula Placement Confirmation: positive ETCO2 Dental Injury: Teeth and Oropharynx as per pre-operative assessment

## 2020-12-26 NOTE — Transfer of Care (Signed)
Immediate Anesthesia Transfer of Care Note  Patient: Rose Miller  Procedure(s) Performed: TRANSCATHETER AORTIC VALVE REPLACEMENT, TRANSFEMORAL INTRAOPERATIVE TRANSTHORACIC ECHOCARDIOGRAM  Patient Location: Cath Lab  Anesthesia Type:MAC  Level of Consciousness: awake and patient cooperative  Airway & Oxygen Therapy: Patient Spontanous Breathing and Patient connected to nasal cannula oxygen  Post-op Assessment: Report given to RN, Post -op Vital signs reviewed and stable and Patient moving all extremities  Post vital signs: Reviewed and stable  Last Vitals:  Vitals Value Taken Time  BP 123/59 12/26/20 1212  Temp 36.3 C 12/26/20 1209  Pulse 41 12/26/20 1213  Resp 17 12/26/20 1213  SpO2 99 % 12/26/20 1213  Vitals shown include unvalidated device data.  Last Pain:  Vitals:   12/26/20 1209  TempSrc: Temporal  PainSc: 0-No pain      Patients Stated Pain Goal: 0 (44/03/47 [id number])  Complications: No notable events documented.

## 2020-12-26 NOTE — Discharge Instructions (Signed)

## 2020-12-27 ENCOUNTER — Inpatient Hospital Stay (HOSPITAL_COMMUNITY)
Admission: RE | Admit: 2020-12-27 | Discharge: 2020-12-27 | Disposition: A | Discharge status: Home / Self Care | Payer: Medicare Other | Source: Home / Self Care | Attending: Physician Assistant | Admitting: Physician Assistant

## 2020-12-27 ENCOUNTER — Encounter (HOSPITAL_COMMUNITY): Payer: Self-pay | Admitting: Internal Medicine

## 2020-12-27 ENCOUNTER — Other Ambulatory Visit (HOSPITAL_COMMUNITY): Payer: Self-pay

## 2020-12-27 ENCOUNTER — Inpatient Hospital Stay (HOSPITAL_COMMUNITY): Payer: Medicare Other

## 2020-12-27 DIAGNOSIS — Z952 Presence of prosthetic heart valve: Secondary | ICD-10-CM

## 2020-12-27 DIAGNOSIS — R001 Bradycardia, unspecified: Secondary | ICD-10-CM

## 2020-12-27 DIAGNOSIS — I350 Nonrheumatic aortic (valve) stenosis: Secondary | ICD-10-CM

## 2020-12-27 LAB — CBC
HCT: 33.2 % — ABNORMAL LOW (ref 36.0–46.0)
Hemoglobin: 10.6 g/dL — ABNORMAL LOW (ref 12.0–15.0)
MCH: 27.3 pg (ref 26.0–34.0)
MCHC: 31.9 g/dL (ref 30.0–36.0)
MCV: 85.6 fL (ref 80.0–100.0)
Platelets: 161 10*3/uL (ref 150–400)
RBC: 3.88 MIL/uL (ref 3.87–5.11)
RDW: 16.2 % — ABNORMAL HIGH (ref 11.5–15.5)
WBC: 6.6 10*3/uL (ref 4.0–10.5)
nRBC: 0 % (ref 0.0–0.2)

## 2020-12-27 LAB — POCT I-STAT, CHEM 8
BUN: 15 mg/dL (ref 8–23)
BUN: 17 mg/dL (ref 8–23)
Calcium, Ion: 1.23 mmol/L (ref 1.15–1.40)
Calcium, Ion: 1.22 mmol/L (ref 1.15–1.40)
Chloride: 108 mmol/L (ref 98–111)
Chloride: 106 mmol/L (ref 98–111)
Creatinine, Ser: 1.1 mg/dL — ABNORMAL HIGH (ref 0.44–1.00)
Creatinine, Ser: 1 mg/dL (ref 0.44–1.00)
Glucose, Bld: 122 mg/dL — ABNORMAL HIGH (ref 70–99)
Glucose, Bld: 161 mg/dL — ABNORMAL HIGH (ref 70–99)
HCT: 31 % — ABNORMAL LOW (ref 36.0–46.0)
HCT: 31 % — ABNORMAL LOW (ref 36.0–46.0)
Hemoglobin: 10.5 g/dL — ABNORMAL LOW (ref 12.0–15.0)
Hemoglobin: 10.5 g/dL — ABNORMAL LOW (ref 12.0–15.0)
Potassium: 3.9 mmol/L (ref 3.5–5.1)
Potassium: 4.1 mmol/L (ref 3.5–5.1)
Sodium: 144 mmol/L (ref 135–145)
Sodium: 142 mmol/L (ref 135–145)
TCO2: 26 mmol/L (ref 22–32)
TCO2: 27 mmol/L (ref 22–32)

## 2020-12-27 LAB — ECHOCARDIOGRAM COMPLETE
AR max vel: 2.29 cm2
AV Area VTI: 2.07 cm2
AV Area mean vel: 2.43 cm2
AV Mean grad: 17.7 mmHg
AV Peak grad: 28.9 mmHg
Ao pk vel: 2.69 m/s
Area-P 1/2: 2.56 cm2
Height: 70 in
S' Lateral: 2.8 cm
Weight: 5552.06 oz

## 2020-12-27 LAB — POCT I-STAT 7, (LYTES, BLD GAS, ICA,H+H)
Acid-base deficit: 1 mmol/L (ref 0.0–2.0)
Acid-base deficit: 2 mmol/L (ref 0.0–2.0)
Bicarbonate: 26.1 mmol/L (ref 20.0–28.0)
Bicarbonate: 26.1 mmol/L (ref 20.0–28.0)
Calcium, Ion: 1.29 mmol/L (ref 1.15–1.40)
Calcium, Ion: 1.28 mmol/L (ref 1.15–1.40)
HCT: 31 % — ABNORMAL LOW (ref 36.0–46.0)
HCT: 30 % — ABNORMAL LOW (ref 36.0–46.0)
Hemoglobin: 10.5 g/dL — ABNORMAL LOW (ref 12.0–15.0)
Hemoglobin: 10.2 g/dL — ABNORMAL LOW (ref 12.0–15.0)
O2 Saturation: 98 %
O2 Saturation: 97 %
Potassium: 3.9 mmol/L (ref 3.5–5.1)
Potassium: 4.2 mmol/L (ref 3.5–5.1)
Sodium: 144 mmol/L (ref 135–145)
Sodium: 142 mmol/L (ref 135–145)
TCO2: 28 mmol/L (ref 22–32)
TCO2: 28 mmol/L (ref 22–32)
pCO2 arterial: 53.8 mmHg — ABNORMAL HIGH (ref 32.0–48.0)
pCO2 arterial: 64 mmHg — ABNORMAL HIGH (ref 32.0–48.0)
pH, Arterial: 7.294 — ABNORMAL LOW (ref 7.350–7.450)
pH, Arterial: 7.218 — ABNORMAL LOW (ref 7.350–7.450)
pO2, Arterial: 123 mmHg — ABNORMAL HIGH (ref 83.0–108.0)
pO2, Arterial: 115 mmHg — ABNORMAL HIGH (ref 83.0–108.0)

## 2020-12-27 LAB — BASIC METABOLIC PANEL
Anion gap: 9 (ref 5–15)
BUN: 14 mg/dL (ref 8–23)
CO2: 22 mmol/L (ref 22–32)
Calcium: 9 mg/dL (ref 8.9–10.3)
Chloride: 104 mmol/L (ref 98–111)
Creatinine, Ser: 1 mg/dL (ref 0.44–1.00)
GFR, Estimated: >60 mL/min (ref >60)
Glucose, Bld: 89 mg/dL (ref 70–99)
Potassium: 4.3 mmol/L (ref 3.5–5.1)
Sodium: 135 mmol/L (ref 135–145)

## 2020-12-27 LAB — GLUCOSE, CAPILLARY
Glucose-Capillary: 102 mg/dL — ABNORMAL HIGH (ref 70–99)
Glucose-Capillary: 139 mg/dL — ABNORMAL HIGH (ref 70–99)
Glucose-Capillary: 136 mg/dL — ABNORMAL HIGH (ref 70–99)
Glucose-Capillary: 138 mg/dL — ABNORMAL HIGH (ref 70–99)

## 2020-12-27 MED ORDER — HYDRALAZINE HCL 50 MG PO TABS: 25.0000 mg | ORAL_TABLET | Freq: Three times a day (TID) | ORAL | 3 refills | Status: DC

## 2020-12-27 MED ORDER — HYDRALAZINE HCL 50 MG PO TABS
50.0000 mg | ORAL_TABLET | Freq: Once | ORAL | Status: AC
  Administered 2020-12-27: 13:00:00 50 mg via ORAL
  Filled 2020-12-27: qty 1

## 2020-12-27 MED ORDER — ASPIRIN 81 MG PO CHEW
81.0000 mg | CHEWABLE_TABLET | Freq: Every day | ORAL | 3 refills | Status: DC
  Filled 2020-12-27: qty 90, 90d supply, fill #0

## 2020-12-27 MED ORDER — METOPROLOL SUCCINATE ER 25 MG PO TB24
25.0000 mg | ORAL_TABLET | Freq: Every day | ORAL | Status: DC
  Filled 2020-12-27: qty 1

## 2020-12-27 MED ORDER — HYDRALAZINE HCL 25 MG PO TABS
25.0000 mg | ORAL_TABLET | Freq: Two times a day (BID) | ORAL | Status: DC
  Administered 2020-12-27 (×2): 25 mg via ORAL
  Filled 2020-12-27 (×2): qty 1

## 2020-12-27 MED ORDER — LOSARTAN POTASSIUM 50 MG PO TABS
100.0000 mg | ORAL_TABLET | Freq: Every day | ORAL | Status: DC
  Administered 2020-12-27: 10:00:00 100 mg via ORAL
  Filled 2020-12-27: qty 2

## 2020-12-27 MED ORDER — ASPIRIN 81 MG PO CHEW: 81.0000 mg | CHEWABLE_TABLET | Freq: Every day | ORAL | 3 refills | Status: DC

## 2020-12-27 MED ORDER — AMLODIPINE BESYLATE 10 MG PO TABS
10.0000 mg | ORAL_TABLET | Freq: Every day | ORAL | Status: DC
  Administered 2020-12-27: 10:00:00 10 mg via ORAL
  Filled 2020-12-27: qty 1

## 2020-12-27 MED ORDER — HYDRALAZINE HCL 50 MG PO TABS
25.0000 mg | ORAL_TABLET | Freq: Three times a day (TID) | ORAL | 3 refills | Status: DC
  Filled 2020-12-27: qty 90, 60d supply, fill #0

## 2020-12-27 NOTE — Discharge Summary (Addendum)
Agency VALVE TEAM  Discharge Summary    Patient ID: Rose Miller MRN: [id number]; DOB: 1955-11-24  Admit date: 12/26/2020 Discharge date: 12/27/2020  Primary Care Provider: Flossie Buffy, NP  Primary Cardiologist: Sinclair Grooms, MD / Dr. Ali Lowe & Dr. Cyndia Bent (TAVR)  Discharge Diagnoses    Principal Problem:   S/P TAVR (transcatheter aortic valve replacement) Active Problems:   Essential hypertension   Hypercholesterolemia   Class 3 severe obesity with serious comorbidity and body mass index (BMI) of 45.0 to 49.9 in adult [facility])   DM (diabetes mellitus) (HCC)   CKD (chronic kidney disease) stage 3, GFR 30-59 ml/min (HCC)   Aortic valve disease   COPD (chronic obstructive pulmonary disease) (HCC)   Allergies No Known Allergies  Diagnostic Studies/Procedures    TAVR OPERATIVE NOTE     Date of Procedure:                12/26/2020   Preoperative Diagnosis:      Severe Aortic Stenosis    Postoperative Diagnosis:    Same    Procedure:        Transcatheter Aortic Valve Replacement - Percutaneous Right Transfemoral Approach             Edwards Sapien 3 Ultra Resilia THV (size 26 mm, model # 9755RSL, serial # [id number])              Co-Surgeons:                        Gaye Pollack, MD and Lenna Sciara, MD     Anesthesiologist:                  Gennie Alma, MD   Echocardiographer:              Bertrum Sol, MD   Pre-operative Echo Findings: Severe aortic stenosis Normal left ventricular systolic function   Post-operative Echo Findings: No paravalvular leak Normal left ventricular systolic function   ______________________   Echo 12/27/20:  IMPRESSIONS   1. Left ventricular ejection fraction, by estimation, is 70 to 75%. The  left ventricle has hyperdynamic function. The left ventricle has no  regional wall motion abnormalities. There is moderate concentric left  ventricular hypertrophy. Left ventricular   diastolic parameters are consistent with Grade II diastolic dysfunction  (pseudonormalization). Elevated left atrial pressure.   2. Right ventricular systolic function is hyperdynamic. The right  ventricular size is normal. Tricuspid regurgitation signal is inadequate  for assessing PA pressure.   3. Left atrial size was mildly dilated.   4. The mitral valve is normal in structure. No evidence of mitral valve  regurgitation.   5. The aortic valve has been repaired/replaced. Aortic valve  regurgitation is not visualized. There is a 26 mm Sapien prosthetic (TAVR)  valve present in the aortic position. Echo findings are consistent with  normal structure and function of the aortic  valve prosthesis. Aortic valve mean gradient measures 17.7 mmHg. Aortic  valve Vmax measures 2.69 m/s.   6. There is borderline dilatation of the ascending aorta, measuring 37  mm.   7. The inferior vena cava is normal in size with greater than 50%  respiratory variability, suggesting right atrial pressure of 3 mmHg.  History of Present Illness     Rose Miller is a 65 y.o. female with a history of morbid obesity (BMI 49, BSA 2.76), IVCD, pre-diabetes, HTN,  HLD, mild COPD, and mixed aortic valve disease with moderate to severe AS/severe AI who presented to [facility] on 12/26/20 for planned TAVR.  She is followed by Dr. [name] for her cardiology care. The patient was noted to develop progression of aortic insufficiency and aortic stenosis.  Echo showed severe aortic valvular disease consisting of moderate to severe aortic stenosis with AVA 0.78cm2, with a mean gradient of 28 mmHg and severe aortic insufficiency with an ejection fraction She was referred for cardiac catheterization which demonstrated mild obstructive coronary artery disease, elevated wedge pressure of 24 mmHg with V waves to 40 mmHg and an LVEDP of 32 mmHg. The peak to peak pressure gradient on pullback was 53mmHg.    The patient has been evaluated by the  multidisciplinary valve team and felt to have severe, symptomatic aortic stenosis and to be a suitable candidate for TAVR, which was set up for 12/26/20.   Hospital Course     Consultants: none   Severe AS/AI: s/p successful TAVR with a 26 mm Edwards Sapien 3 Ultra Resilia THV via the TF approach on 12/26/20. Post operative echo EF 70%, normally functioning TAVR with a mean gradient of 17.7 mmHg and no PVL. Groin sites are stable. ECG with sinus and no high grade heart block. Continue Asprin. Plan for discharge home today with close follow up in the office next week.   HTN: BP elevated and started on a nitro gtt. Will resume home medications Losartan 100mg  daily, Norvasc 10mg  daily, and hydralazine 25mg  BID. Given marked sinus bradycardia after TAVR will discontinue home Toprol XL. She is still hypertensive so will increase hydralazine to 50mg  TID. Will continue to monitor in the outpatient setting.    Sinus bradycardia: had marked sinus bradycardia post operatively with a 4.5 second pause. HR has improved into the 60s. Stop home Toprol XL. Will place a Zio AT prior to DC.    Temperature: pt had a mild temp overnight, Tmax 100.8. White count is normal. UA and blood cultures ordered. Pt overall feels well.    Morbid obesity: Body mass index is 49.79 kg/m. Recommend diet and exercise.  _____________  Discharge Vitals Blood pressure (!) 144/70, pulse 70, temperature 98.8 F (37.1 C), resp. rate 14, height 5\' 10"  (1.778 m), weight (!) 157.4 kg, SpO2 95 %.  Filed Weights   12/26/20 0712 12/27/20 0605  Weight: (!) 154.2 kg (!) 157.4 kg    Labs & Radiologic Studies    CBC Recent Labs    12/26/20 1225 12/27/20 0123  WBC  --  6.6  HGB 10.2* 10.6*  HCT 30.0* 33.2*  MCV  --  85.6  PLT  --  578   Basic Metabolic Panel Recent Labs    12/26/20 1225 12/27/20 0123  NA 143 135  K 4.0 4.3  CL 107 104  CO2  --  22  GLUCOSE 145* 89  BUN 15 14  CREATININE 1.00 1.00  CALCIUM  --  9.0    Liver Function Tests No results for input(s): AST, ALT, ALKPHOS, BILITOT, PROT, ALBUMIN in the last 72 hours. No results for input(s): LIPASE, AMYLASE in the last 72 hours. Cardiac Enzymes No results for input(s): CKTOTAL, CKMB, CKMBINDEX, TROPONINI in the last 72 hours. BNP Invalid input(s): POCBNP D-Dimer No results for input(s): DDIMER in the last 72 hours. Hemoglobin A1C No results for input(s): HGBA1C in the last 72 hours. Fasting Lipid Panel No results for input(s): CHOL, HDL, LDLCALC, TRIG, CHOLHDL, LDLDIRECT in the last 72  hours. Thyroid Function Tests No results for input(s): TSH, T4TOTAL, T3FREE, THYROIDAB in the last 72 hours.  Invalid input(s): FREET3 _____________  DG Chest 2 View  Result Date: 12/22/2020 CLINICAL DATA:  Preoperative evaluation for transcatheter aortic valve replacement. EXAM: CHEST - 2 VIEW COMPARISON:  CTA chest 12/14/2020. FINDINGS: Stable mild cardiomegaly. Atherosclerotic calcification of the aortic knob. Mildly prominent perihilar and bibasilar interstitial markings. No focal airspace consolidation. No pleural effusion or pneumothorax. IMPRESSION: Mildly prominent perihilar and bibasilar interstitial markings, which could represent bronchitic lung changes or mild edema. Electronically Signed   By: Davina Poke D.O.   On: 12/22/2020 12:58   CT CORONARY MORPH W/CTA COR W/SCORE W/CA W/CM &/OR WO/CM  Addendum Date: 12/14/2020   ADDENDUM REPORT: 12/14/2020 14:09 CLINICAL DATA:  Aortic stenosis EXAM: Cardiac TAVR CT TECHNIQUE: The patient was scanned on a Siemens Force 938 slice scanner. A 120 kV retrospective scan was triggered in the descending thoracic aorta at 111 HU's. Gantry rotation speed was 270 msecs and collimation was .9 mm. No beta blockade or nitro were given. The 3D data set was reconstructed in 5% intervals of the R-R cycle. Systolic and diastolic phases were analyzed on a dedicated work station using MPR, MIP and VRT modes. The patient  received 80 cc of contrast. FINDINGS: Aortic Valve: Tri leaflet calcified valve with restricted leaflet motion calcium score 1835 Aorta: No aneurysm normal arch vessels mild calcific atherosclerosis Sinotubular Junction: 30 mm Ascending Thoracic Aorta: 37 mm Aortic Arch: 31 mm Descending Thoracic Aorta: 27 mm Sinus of Valsalva Measurements: Non-coronary: 30.4 mm Right - coronary: 28.2 mm Left - coronary: 29 mm Coronary Artery Height above Annulus: Left Main: 13.1 mm above annulus Right Coronary: 16 mm above annulus Virtual Basal Annulus Measurements: Maximum/Minimum Diameter: 26.3 mm x 21.1 mm Perimeter: 80.4 mm Area: 479 mm2 Coronary Arteries: Sufficient height above annulus for deployment Optimum Fluoroscopic Angle for Delivery: LAO 10 Cranial 4 degrees IMPRESSION: Poor quality study.  Poor opacification of aorta 1.  Calcified tri leaflet AV with calcium score 1835 2.  Annular area of 479 mm2 suitable for a 26 mm Sapien 3 valve 3.  Coronary arteries sufficient height above annulus for deployment 4. Optimum angiographic angle for deployment LAO 10 cranial 4 degrees [place] Electronically Signed   By: Jenkins Rouge M.D.   On: 12/14/2020 14:09   Result Date: 12/14/2020 EXAM: OVER-READ INTERPRETATION  CT CHEST The following report is an over-read performed by radiologist Dr. Vinnie Langton of [facility], [place] on 12/14/2020. This over-read does not include interpretation of cardiac or coronary anatomy or pathology. The coronary calcium score/coronary CTA interpretation by the cardiologist is attached. COMPARISON:  None. FINDINGS: Extracardiac findings will be described separately under dictation for contemporaneously obtained CTA chest, abdomen and pelvis. IMPRESSION: Please see separate dictation for contemporaneously obtained CTA chest, abdomen and pelvis dated 12/14/2020 for full description of relevant extracardiac findings. Electronically Signed: By: Vinnie Langton M.D. On: 12/14/2020 11:47   MM  3D SCREEN BREAST BILATERAL  Result Date: 12/04/2020 CLINICAL DATA:  Screening. EXAM: DIGITAL SCREENING BILATERAL MAMMOGRAM WITH TOMOSYNTHESIS AND CAD TECHNIQUE: Bilateral screening digital craniocaudal and mediolateral oblique mammograms were obtained. Bilateral screening digital breast tomosynthesis was performed. The images were evaluated with computer-aided detection. COMPARISON:  Previous exam(s). ACR Breast Density Category b: There are scattered areas of fibroglandular density. FINDINGS: There are no findings suspicious for malignancy. IMPRESSION: No mammographic evidence of malignancy. A result letter of this screening mammogram will be mailed directly to  the patient. RECOMMENDATION: Screening mammogram in one year. (Code:SM-B-01Y) BI-RADS CATEGORY  1: Negative. Electronically Signed   By: Lajean Manes M.D.   On: 12/04/2020 13:00   CT ANGIO CHEST AORTA W/CM & OR WO/CM  Result Date: 12/14/2020 CLINICAL DATA:  66 year old female with history of severe aortic stenosis. Preprocedural study prior to potential transcatheter aortic valve replacement (TAVR) procedure. EXAM: CT ANGIOGRAPHY CHEST, ABDOMEN AND PELVIS TECHNIQUE: Multidetector CT imaging through the chest, abdomen and pelvis was performed using the standard protocol during bolus administration of intravenous contrast. Multiplanar reconstructed images and MIPs were obtained and reviewed to evaluate the vascular anatomy. CONTRAST:  146mL OMNIPAQUE IOHEXOL 350 MG/ML SOLN COMPARISON:  No priors. FINDINGS: Comment: Today's study is limited by suboptimal contrast bolus (examination was performed during portal venous phase, not arterial phase), and extensive image noise related to the patient's large body habitus, such that portions of the examination are essentially nondiagnostic for purposes of vascular assessment. CTA CHEST FINDINGS Cardiovascular: Heart size is mildly enlarged. There is no significant pericardial fluid, thickening or pericardial  calcification. There is aortic atherosclerosis, as well as atherosclerosis of the great vessels of the mediastinum and the coronary arteries, including calcified atherosclerotic plaque in the left anterior descending and right coronary arteries. Severe calcifications of the aortic valve. Mild calcifications of the mitral annulus. Mediastinum/Lymph Nodes: No pathologically enlarged mediastinal or hilar lymph nodes. Esophagus is unremarkable in appearance. No axillary lymphadenopathy. Lungs/Pleura: Widespread areas of ground-glass attenuation and mild interlobular septal thickening noted throughout the lungs bilaterally, favored to reflect a background of mild interstitial pulmonary edema. Mosaic attenuation in the lungs, suggesting air trapping from small airways disease. No acute consolidative airspace disease. No pleural effusions. No suspicious appearing pulmonary nodules or masses are noted. Musculoskeletal/Soft Tissues: There are no aggressive appearing lytic or blastic lesions noted in the visualized portions of the skeleton. CTA ABDOMEN AND PELVIS FINDINGS Hepatobiliary: No suspicious cystic or solid hepatic lesions. No intra or extrahepatic biliary ductal dilatation. Tiny calcified gallstones lying dependently in the gallbladder. No findings to suggest an acute cholecystitis are noted at this time. Pancreas: No pancreatic mass. No pancreatic ductal dilatation. No pancreatic or peripancreatic fluid collections or inflammatory changes. Spleen: Unremarkable. Adrenals/Urinary Tract: 1.2 cm low-attenuation lesion in the lower pole of the right kidney, compatible with a simple cyst. Left kidney and bilateral adrenal glands are normal in appearance. No hydroureteronephrosis. Urinary bladder is normal in appearance. Stomach/Bowel: The appearance of the stomach is normal. No pathologic dilatation of small bowel or colon. Numerous colonic diverticulae are noted, particularly in the sigmoid colon, without surrounding  inflammatory changes to suggest an acute diverticulitis at this time. Normal appendix. Vascular/Lymphatic: Aortic atherosclerosis, with vascular findings and measurements pertinent to potential TAVR procedure, as detailed below. No aneurysm or dissection noted in the abdominal or pelvic vasculature. No lymphadenopathy noted in the abdomen or pelvis. Reproductive: Multiple densely calcified lesions in the uterus, largest of which is in the left side of the uterine fundus measuring 6.5 x 5.9 x 5.7 cm, likely to represent multifocal fibroids. Ovaries are a trophic. Other: No significant volume of ascites. No pneumoperitoneum. Large periumbilical ventral hernia containing only omental fat. Musculoskeletal: There are no aggressive appearing lytic or blastic lesions noted in the visualized portions of the skeleton. VASCULAR MEASUREMENTS PERTINENT TO TAVR: AORTA: Minimal Aortic Diameter-17 x 13 mm Severity of Aortic Calcification-moderate RIGHT PELVIS: Right Common Iliac Artery - Minimal Diameter- nondiagnostic image quality insufficient to accurately measure vessel diameter. Tortuosity-mild Calcification - mild  Right External Iliac Artery - Minimal Diameter - nondiagnostic image quality insufficient to accurately measure vessel diameter. Tortuosity - mild Calcification-none Right Common Femoral Artery - Minimal Diameter - nondiagnostic image quality insufficient to accurately measure vessel diameter. Tortuosity - mild Calcification - mild LEFT PELVIS: Left Common Iliac Artery - Minimal Diameter - nondiagnostic image quality insufficient to accurately measure vessel diameter. Tortuosity - mild Calcification-mild to moderate Left External Iliac Artery - Minimal Diameter - nondiagnostic image quality insufficient to accurately measure vessel diameter. Tortuosity - mild Calcification - mild Left Common Femoral Artery - Minimal Diameter - nondiagnostic image quality insufficient to accurately measure vessel diameter.  Tortuosity - mild Calcification-moderate Review of the MIP images confirms the above findings. IMPRESSION: 1. Limited study, essentially nondiagnostic in terms of vascular assessment for luminal diameter with respect to planning of TAVR procedure. Repeat pelvic CTA should be considered. 2. Severe calcifications of the aortic valve, compatible with reported clinical history of severe aortic stenosis. 3. Mild cardiomegaly with evidence of interstitial pulmonary edema; imaging findings concerning for mild congestive heart failure. 4. Probable air trapping in the lungs indicative of small airways disease. 5. Colonic diverticulosis without evidence of acute diverticulitis at this time. 6. Fibroid uterus. 7. Large periumbilical ventral hernia containing only omental fat. No associated bowel incarceration or obstruction at this time. 8. Additional incidental findings, as above. Electronically Signed   By: Vinnie Langton M.D.   On: 12/14/2020 13:53   ECHOCARDIOGRAM COMPLETE  Result Date: 12/27/2020    ECHOCARDIOGRAM REPORT   Patient Name:   MAEVYN RIORDAN Date of Exam: 12/27/2020 Medical Rec #:  [id number]      Height:       70.0 in Accession #:    [id number]     Weight:       347.0 lb Date of Birth:  Sep 18, 1955     BSA:          2.638 m Patient Age:    75 years       BP:           140/60 mmHg Patient Gender: F              HR:           75 bpm. Exam Location:  Inpatient Procedure: 2D Echo, Cardiac Doppler and Color Doppler Indications:    Post TAVR evaluation V43.3 / Z95.2  History:        Patient has prior history of Echocardiogram examinations, most                 recent 12/26/2020. Aortic Valve Disease; Risk                 Factors:Hypertension, Diabetes and Dyslipidemia. Chronic kidney                 disease.                 Aortic Valve: 26 mm Sapien prosthetic, stented (TAVR) valve is                 present in the aortic position.  Sonographer:    Darlina Sicilian RDCS Referring Phys: [place]  1. Left ventricular ejection fraction, by estimation, is 70 to 75%. The left ventricle has hyperdynamic function. The left ventricle has no regional wall motion abnormalities. There is moderate concentric left ventricular hypertrophy. Left ventricular diastolic parameters are consistent with Grade II diastolic dysfunction (pseudonormalization). Elevated left atrial pressure.  2. Right ventricular systolic function is hyperdynamic. The right ventricular size is normal. Tricuspid regurgitation signal is inadequate for assessing PA pressure.  3. Left atrial size was mildly dilated.  4. The mitral valve is normal in structure. No evidence of mitral valve regurgitation.  5. The aortic valve has been repaired/replaced. Aortic valve regurgitation is not visualized. There is a 26 mm Sapien prosthetic (TAVR) valve present in the aortic position. Echo findings are consistent with normal structure and function of the aortic valve prosthesis. Aortic valve mean gradient measures 17.7 mmHg. Aortic valve Vmax measures 2.69 m/s.  6. There is borderline dilatation of the ascending aorta, measuring 37 mm.  7. The inferior vena cava is normal in size with greater than 50% respiratory variability, suggesting right atrial pressure of 3 mmHg. FINDINGS  Left Ventricle: Left ventricular ejection fraction, by estimation, is 70 to 75%. The left ventricle has hyperdynamic function. The left ventricle has no regional wall motion abnormalities. The left ventricular internal cavity size was normal in size. There is moderate concentric left ventricular hypertrophy. Left ventricular diastolic parameters are consistent with Grade II diastolic dysfunction (pseudonormalization). Elevated left atrial pressure. Right Ventricle: The right ventricular size is normal. No increase in right ventricular wall thickness. Right ventricular systolic function is hyperdynamic. Tricuspid regurgitation signal is inadequate for assessing PA pressure.  Left Atrium: Left atrial size was mildly dilated. Right Atrium: Right atrial size was normal in size. Pericardium: There is no evidence of pericardial effusion. Mitral Valve: The mitral valve is normal in structure. Mild mitral annular calcification. No evidence of mitral valve regurgitation. Tricuspid Valve: The tricuspid valve is normal in structure. Tricuspid valve regurgitation is not demonstrated. Aortic Valve: The aortic valve has been repaired/replaced. Aortic valve regurgitation is not visualized. Aortic valve mean gradient measures 17.7 mmHg. Aortic valve peak gradient measures 28.9 mmHg. Aortic valve area, by VTI measures 2.07 cm. There is a  26 mm Sapien prosthetic, stented (TAVR) valve present in the aortic position. Echo findings are consistent with normal structure and function of the aortic valve prosthesis. Pulmonic Valve: The pulmonic valve was not well visualized. Pulmonic valve regurgitation is not visualized. Aorta: The aortic root is normal in size and structure. There is borderline dilatation of the ascending aorta, measuring 37 mm. Venous: The inferior vena cava is normal in size with greater than 50% respiratory variability, suggesting right atrial pressure of 3 mmHg. IAS/Shunts: No atrial level shunt detected by color flow Doppler.  LEFT VENTRICLE PLAX 2D LVIDd:         4.20 cm   Diastology LVIDs:         2.80 cm   LV e' medial:    4.98 cm/s LV PW:         1.40 cm   LV E/e' medial:  20.3 LV IVS:        1.55 cm   LV e' lateral:   3.28 cm/s LVOT diam:     2.30 cm   LV E/e' lateral: 30.7 LV SV:         106 LV SV Index:   40 LVOT Area:     4.15 cm  RIGHT VENTRICLE RV S prime:     19.60 cm/s TAPSE (M-mode): 2.2 cm LEFT ATRIUM             Index LA diam:        4.10 cm 1.55 cm/m LA Vol (A2C):   54.9 ml 20.81 ml/m LA Vol (A4C):   49.2 ml  18.65 ml/m LA Biplane Vol: 52.8 ml 20.01 ml/m  AORTIC VALVE AV Area (Vmax):    2.29 cm AV Area (Vmean):   2.43 cm AV Area (VTI):     2.07 cm AV Vmax:            268.67 cm/s AV Vmean:          199.667 cm/s AV VTI:            0.511 m AV Peak Grad:      28.9 mmHg AV Mean Grad:      17.7 mmHg LVOT Vmax:         148.00 cm/s LVOT Vmean:        117.000 cm/s LVOT VTI:          0.255 m LVOT/AV VTI ratio: 0.50  AORTA Ao Asc diam: 3.70 cm MITRAL VALVE MV Area (PHT): 2.56 cm     SHUNTS MV Decel Time: 296 msec     Systemic VTI:  0.26 m MV E velocity: 101.00 cm/s  Systemic Diam: 2.30 cm MV A velocity: 90.20 cm/s MV E/A ratio:  1.12 Mihai Croitoru MD Electronically signed by Sanda Klein MD Signature Date/Time: 12/27/2020/11:01:02 AM    Final    ECHOCARDIOGRAM LIMITED  Result Date: 12/26/2020    ECHOCARDIOGRAM LIMITED REPORT   Patient Name:   Rose Miller Date of Exam: 12/26/2020 Medical Rec #:  [id number]      Height:       70.0 in Accession #:    [id number]     Weight:       340.0 lb Date of Birth:  [birth year]     BSA:          2.615 m Patient Age:    41 years       BP:           165/61 mmHg Patient Gender: F              HR:           63 bpm. Exam Location:  Inpatient Procedure: Limited Echo, Color Doppler and Cardiac Doppler Indications:     Aortic Stenosis i35.0  History:         Patient has prior history of Echocardiogram examinations, most                  recent 11/15/2020. Risk Factors:Hypertension, Diabetes and                  Dyslipidemia.                  Aortic Valve: 26 mm Edwards Ultra, stented (TAVR) valve is                  present in the aortic position. Procedure Date: 12/26/2020.  Sonographer:     Raquel Sarna Senior RDCS Referring Phys:  [id number] Early [place] Diagnosing Phys: Sanda Klein MD  Sonographer Comments: 65mm Edwards S3U TAVR Implanted IMPRESSIONS  1. Left ventricular ejection fraction, by estimation, is 65 to 70%. The left ventricle has normal function. The left ventricle has no regional wall motion abnormalities. There is mild concentric left ventricular hypertrophy. Left ventricular diastolic function could not be evaluated.  2. Right  ventricular systolic function is normal. The right ventricular size is normal.  3. Left atrial size was moderately dilated.  4. The mitral valve is degenerative. Moderate mitral valve regurgitation. Moderate mitral annular calcification.  5. Tricuspid valve regurgitation is mild to moderate.  6. The aortic valve has been repaired/replaced. There is a 26 mm Edwards Ultra, stented (TAVR) valve present in the aortic position. Procedure Date: 12/26/2020. Echo findings are consistent with normal structure and function of the aortic valve prosthesis. PRE-PROCEDURAL FINDINGS:  Normal left ventricular systolic function. Estimated LVEF 60-65%. There are no regional wall motion abnormalities. Moderate-to-severe calcific aortic stenosis. Trileaflet aortic valve. Peak aortic valve gradient 43 mm Hg, mean gradient 28 mm Hg. Dimensionless obstructive index 0.41, calculated aortic valve area is 1.29 cm (index for BSA 0.5 cm/m). Severe aortic insufficiency. No pericardial effusion.  POST-PROCEDURAL FINDINGS:  Hyperdynamic left ventricular systolic function. Estimated LVEF 65-70%. There are no regional wall motion abnormalities. There is no evidence for dynamic LVOT obstruction. Well-seated TAVR stent-valve. Peak aortic valve gradient 11 mm Hg, mean gradient 7 mm Hg. Dimensionless obstructive index 0.79, calculated aortic valve area is 2.58 cm (index for BSA 0.99 cm/m). There is no aortic insufficiency and no perivalvular leak. Mild mitral insufficiency. No pericardial effusion.  FINDINGS  Left Ventricle: Left ventricular ejection fraction, by estimation, is 65 to 70%. The left ventricle has normal function. The left ventricle has no regional wall motion abnormalities. The left ventricular internal cavity size was normal in size. There is  mild concentric left ventricular hypertrophy. Left ventricular diastolic function could not be evaluated. Right Ventricle: The right ventricular size is normal. No increase in right  ventricular wall thickness. Right ventricular systolic function is normal. Left Atrium: Left atrial size was moderately dilated. Right Atrium: Right atrial size was normal in size. Pericardium: There is no evidence of pericardial effusion. Mitral Valve: The mitral valve is degenerative in appearance. Moderate mitral annular calcification. Moderate mitral valve regurgitation. Tricuspid Valve: The tricuspid valve is grossly normal. Tricuspid valve regurgitation is mild to moderate. Aortic Valve: The aortic valve has been repaired/replaced. Aortic regurgitation PHT measures 271 msec. Aortic valve mean gradient measures 7.0 mmHg. Aortic valve peak gradient measures 11.4 mmHg. Aortic valve area, by VTI measures 2.58 cm. There is a 26  mm Edwards Ultra, stented (TAVR) valve present in the aortic position. Procedure Date: 12/26/2020. Echo findings are consistent with normal structure and function of the aortic valve prosthesis. Pulmonic Valve: The pulmonic valve was grossly normal. Pulmonic valve regurgitation is trivial. Aorta: The aortic root is normal in size and structure. IAS/Shunts: The interatrial septum was not assessed. LEFT VENTRICLE PLAX 2D LVOT diam:     2.01 cm LV SV:         119 LV SV Index:   46 LVOT Area:     3.17 cm  AORTIC VALVE AV Area (Vmax):    2.52 cm AV Area (Vmean):   2.62 cm AV Area (VTI):     2.58 cm AV Vmax:           169.00 cm/s AV Vmean:          121.000 cm/s AV VTI:            0.462 m AV Peak Grad:      11.4 mmHg AV Mean Grad:      7.0 mmHg LVOT Vmax:         134.00 cm/s LVOT Vmean:        100.000 cm/s LVOT VTI:          0.376 m LVOT/AV VTI ratio: 0.81 AI PHT:            271 msec  SHUNTS Systemic VTI:  0.38 m Systemic Diam: 2.01 cm Dani Gobble Croitoru MD  Electronically signed by Sanda Klein MD Signature Date/Time: 12/26/2020/2:41:58 PM    Final    Structural Heart Procedure  Result Date: 12/26/2020 See surgical note for result.  CT ANGIO ABDOMEN PELVIS  W &/OR WO CONTRAST  Result  Date: 12/14/2020 CLINICAL DATA:  65 year old female with history of severe aortic stenosis. Preprocedural study prior to potential transcatheter aortic valve replacement (TAVR) procedure. EXAM: CT ANGIOGRAPHY CHEST, ABDOMEN AND PELVIS TECHNIQUE: Multidetector CT imaging through the chest, abdomen and pelvis was performed using the standard protocol during bolus administration of intravenous contrast. Multiplanar reconstructed images and MIPs were obtained and reviewed to evaluate the vascular anatomy. CONTRAST:  122mL OMNIPAQUE IOHEXOL 350 MG/ML SOLN COMPARISON:  No priors. FINDINGS: Comment: Today's study is limited by suboptimal contrast bolus (examination was performed during portal venous phase, not arterial phase), and extensive image noise related to the patient's large body habitus, such that portions of the examination are essentially nondiagnostic for purposes of vascular assessment. CTA CHEST FINDINGS Cardiovascular: Heart size is mildly enlarged. There is no significant pericardial fluid, thickening or pericardial calcification. There is aortic atherosclerosis, as well as atherosclerosis of the great vessels of the mediastinum and the coronary arteries, including calcified atherosclerotic plaque in the left anterior descending and right coronary arteries. Severe calcifications of the aortic valve. Mild calcifications of the mitral annulus. Mediastinum/Lymph Nodes: No pathologically enlarged mediastinal or hilar lymph nodes. Esophagus is unremarkable in appearance. No axillary lymphadenopathy. Lungs/Pleura: Widespread areas of ground-glass attenuation and mild interlobular septal thickening noted throughout the lungs bilaterally, favored to reflect a background of mild interstitial pulmonary edema. Mosaic attenuation in the lungs, suggesting air trapping from small airways disease. No acute consolidative airspace disease. No pleural effusions. No suspicious appearing pulmonary nodules or masses are noted.  Musculoskeletal/Soft Tissues: There are no aggressive appearing lytic or blastic lesions noted in the visualized portions of the skeleton. CTA ABDOMEN AND PELVIS FINDINGS Hepatobiliary: No suspicious cystic or solid hepatic lesions. No intra or extrahepatic biliary ductal dilatation. Tiny calcified gallstones lying dependently in the gallbladder. No findings to suggest an acute cholecystitis are noted at this time. Pancreas: No pancreatic mass. No pancreatic ductal dilatation. No pancreatic or peripancreatic fluid collections or inflammatory changes. Spleen: Unremarkable. Adrenals/Urinary Tract: 1.2 cm low-attenuation lesion in the lower pole of the right kidney, compatible with a simple cyst. Left kidney and bilateral adrenal glands are normal in appearance. No hydroureteronephrosis. Urinary bladder is normal in appearance. Stomach/Bowel: The appearance of the stomach is normal. No pathologic dilatation of small bowel or colon. Numerous colonic diverticulae are noted, particularly in the sigmoid colon, without surrounding inflammatory changes to suggest an acute diverticulitis at this time. Normal appendix. Vascular/Lymphatic: Aortic atherosclerosis, with vascular findings and measurements pertinent to potential TAVR procedure, as detailed below. No aneurysm or dissection noted in the abdominal or pelvic vasculature. No lymphadenopathy noted in the abdomen or pelvis. Reproductive: Multiple densely calcified lesions in the uterus, largest of which is in the left side of the uterine fundus measuring 6.5 x 5.9 x 5.7 cm, likely to represent multifocal fibroids. Ovaries are a trophic. Other: No significant volume of ascites. No pneumoperitoneum. Large periumbilical ventral hernia containing only omental fat. Musculoskeletal: There are no aggressive appearing lytic or blastic lesions noted in the visualized portions of the skeleton. VASCULAR MEASUREMENTS PERTINENT TO TAVR: AORTA: Minimal Aortic Diameter-17 x 13 mm  Severity of Aortic Calcification-moderate RIGHT PELVIS: Right Common Iliac Artery - Minimal Diameter- nondiagnostic image quality insufficient to accurately measure vessel diameter. Tortuosity-mild Calcification -  mild Right External Iliac Artery - Minimal Diameter - nondiagnostic image quality insufficient to accurately measure vessel diameter. Tortuosity - mild Calcification-none Right Common Femoral Artery - Minimal Diameter - nondiagnostic image quality insufficient to accurately measure vessel diameter. Tortuosity - mild Calcification - mild LEFT PELVIS: Left Common Iliac Artery - Minimal Diameter - nondiagnostic image quality insufficient to accurately measure vessel diameter. Tortuosity - mild Calcification-mild to moderate Left External Iliac Artery - Minimal Diameter - nondiagnostic image quality insufficient to accurately measure vessel diameter. Tortuosity - mild Calcification - mild Left Common Femoral Artery - Minimal Diameter - nondiagnostic image quality insufficient to accurately measure vessel diameter. Tortuosity - mild Calcification-moderate Review of the MIP images confirms the above findings. IMPRESSION: 1. Limited study, essentially nondiagnostic in terms of vascular assessment for luminal diameter with respect to planning of TAVR procedure. Repeat pelvic CTA should be considered. 2. Severe calcifications of the aortic valve, compatible with reported clinical history of severe aortic stenosis. 3. Mild cardiomegaly with evidence of interstitial pulmonary edema; imaging findings concerning for mild congestive heart failure. 4. Probable air trapping in the lungs indicative of small airways disease. 5. Colonic diverticulosis without evidence of acute diverticulitis at this time. 6. Fibroid uterus. 7. Large periumbilical ventral hernia containing only omental fat. No associated bowel incarceration or obstruction at this time. 8. Additional incidental findings, as above. Electronically Signed   By:  Vinnie Langton M.D.   On: 12/14/2020 13:53   Disposition   Pt is being discharged home today in good condition.  Follow-up Plans & Appointments     Follow-up Information     Eileen Stanford, PA-C. Go on 01/03/2021.   Specialties: Cardiology, Radiology Why: @ 2:30pm, please arrive at least 10 minutes early Contact information: [address] [place] [address] [phone]                  Discharge Medications   Allergies as of 12/27/2020   No Known Allergies      Medication List     STOP taking these medications    metoprolol succinate 25 MG 24 hr tablet Commonly known as: TOPROL-XL       TAKE these medications    amLODipine 10 MG tablet Commonly known as: NORVASC Take 1 tablet (10 mg total) by mouth daily.   aspirin 81 MG chewable tablet Chew 1 tablet (81 mg total) by mouth daily. Okay to swallow whole Start taking on: December 28, 2020   atorvastatin 10 MG tablet Commonly known as: LIPITOR Take 1 tablet (10 mg total) by mouth daily.   B-12 500 MCG Tabs Take 500 mcg by mouth daily.   Bayer Microlet Lancets lancets Use when testing glucose as recommended.   BD Pen Needle Nano 2nd Gen 32G X 4 MM Misc Generic drug: Insulin Pen Needle 1 Package by Does not apply route 2 (two) times daily.   Blue-Emu Maximum Strength 2.5 % Liqd Generic drug: Menthol (Topical Analgesic) Apply 1 application topically daily as needed (pain).   Cholecalciferol 50 MCG (2000 UT) Tabs Take 2,000 Units by mouth daily.   Co Q-10 100 MG Caps Take 100 mg by mouth daily.   Contour Next Test test strip Generic drug: glucose blood Test up to 4 times per day as recommended   hydrALAZINE 50 MG tablet Commonly known as: APRESOLINE Take 0.5 tablets (25 mg total) by mouth 3 (three) times daily. What changed:  medication strength when to take this   losartan 100  MG tablet Commonly known as: COZAAR Take 1 tablet (100 mg total) by mouth  daily.   neomycin-polymyxin-hydrocortisone OTIC solution Commonly known as: CORTISPORIN Apply 1-2 drops to toenail if there is soreness once daily after shower What changed:  how much to take when to take this additional instructions   Sphygmomanometer Misc 1 Units by Does not apply route daily.   vitamin C 1000 MG tablet Take 1,000 mg by mouth daily.   Zinc 50 MG Caps Take 50 mg by mouth.            Outstanding Labs/Studies   none  Duration of Discharge Encounter   Greater than 30 minutes including physician time.  Signed, Angelena Form, PA-C 12/27/2020, 11:59 AM [phone]  ATTENDING ATTESTATION:  After conducting a review of all available clinical information with the care team, interviewing the patient, and performing a physical exam, I agree with the findings and plan described in this note.  Patient doing well after TAVR.  Due to transient bradycardia, will place Zio monitor.  Transiently febrile so UA and Bcx will be followed.  TTE today with good valve function, EF 70%.  Discharge today with close follow up with titration of BP meds.  Lenna Sciara, MD Pager [phone]

## 2020-12-27 NOTE — Progress Notes (Addendum)
Temp = 38.2 C (100.51F)  Pt has been cold/chilled and had room temp at 76 and multiple layers of blankets on.   Decr room temp and removed some blankets.

## 2020-12-27 NOTE — Plan of Care (Signed)

## 2020-12-27 NOTE — Progress Notes (Signed)
°  Echocardiogram 2D Echocardiogram post TAVR has been performed.  Rose Miller 12/27/2020, 8:00 AM

## 2020-12-27 NOTE — Progress Notes (Addendum)
[place] VALVE TEAM  Patient Name: Rose Miller Date of Encounter: 12/27/2020  Admit date: 12/26/2020  Primary Care Provider: Flossie Buffy, NP [facility] Cardiologist: Sinclair Grooms, MD / Dr. Ali Lowe & Dr. Cyndia Bent (TAVR) [facility] Electrophysiologist:  None   Hospital Problem List     Principal Problem:   S/P TAVR (transcatheter aortic valve replacement) Active Problems:   Essential hypertension   Hypercholesterolemia   Class 3 severe obesity with serious comorbidity and body mass index (BMI) of 45.0 to 49.9 in adult [facility])   DM (diabetes mellitus) ([place])   CKD (chronic kidney disease) stage 3, GFR 30-59 ml/min (HCC)   Aortic valve disease   COPD (chronic obstructive pulmonary disease) ([place])   Subjective   Doing okay. Had a fever overnight. Hasn't walked around.   Inpatient Medications    Scheduled Meds:  aspirin  81 mg Oral Daily   atorvastatin  10 mg Oral Daily   Chlorhexidine Gluconate Cloth  6 each Topical Daily   hydrALAZINE  25 mg Oral BID   insulin aspart  0-24 Units Subcutaneous Q4H   sodium chloride flush  3 mL Intravenous Q12H   Continuous Infusions:  sodium chloride     sodium chloride Stopped (12/26/20 2316)   DOPamine Stopped (12/26/20 1653)   lactated ringers 10 mL/hr at 12/26/20 0927   nitroGLYCERIN 15 mcg/min (12/27/20 0830)   PRN Meds: sodium chloride, acetaminophen **OR** acetaminophen, morphine injection, ondansetron (ZOFRAN) IV, oxyCODONE, sodium chloride flush, traMADol   Vital Signs    Vitals:   12/27/20 0645 12/27/20 0652 12/27/20 0700 12/27/20 0800  BP: (!) 122/54 (!) 135/52 (!) 132/57 (!) 144/70  Pulse: 74 78 75 70  Resp: 16 17 (!) 25 14  Temp:   100.3 F (37.9 C)   TempSrc:   Oral   SpO2: 95% 94% 95% 95%  Weight:      Height:        Intake/Output Summary (Last 24 hours) at 12/27/2020 0851 Last data filed at 12/27/2020 0700 Gross per 24 hour  Intake 2589.96 ml   Output 1400 ml  Net 1189.96 ml   Filed Weights   12/26/20 0712 12/27/20 0605  Weight: (!) 154.2 kg (!) 157.4 kg    Physical Exam    GEN: Well nourished, well developed, in no acute distress. Morbidly obese HEENT: Grossly normal.  Neck: Supple, no JVD, carotid bruits, or masses. Cardiac: RRR, 3/6  RUSB murmur. No rubs, or gallops. No clubbing, cyanosis, edema.   Respiratory:  Respirations regular and unlabored, clear to auscultation bilaterally. GI: Soft, nontender, nondistended, BS + x 4. MS: no deformity or atrophy. Skin: warm and dry, no rash.  Groin sites clear without hematoma or ecchymosis  Neuro:  Strength and sensation are intact. Psych: AAOx3.  Normal affect.  Labs    CBC Recent Labs    12/26/20 1225 12/27/20 0123  WBC  --  6.6  HGB 10.2* 10.6*  HCT 30.0* 33.2*  MCV  --  85.6  PLT  --  409   Basic Metabolic Panel Recent Labs    12/26/20 1225 12/27/20 0123  NA 143 135  K 4.0 4.3  CL 107 104  CO2  --  22  GLUCOSE 145* 89  BUN 15 14  CREATININE 1.00 1.00  CALCIUM  --  9.0   Liver Function Tests No results for input(s): AST, ALT, ALKPHOS, BILITOT, PROT, ALBUMIN in the last 72 hours. No results for  input(s): LIPASE, AMYLASE in the last 72 hours. Cardiac Enzymes No results for input(s): CKTOTAL, CKMB, CKMBINDEX, TROPONINI in the last 72 hours. BNP Invalid input(s): POCBNP D-Dimer No results for input(s): DDIMER in the last 72 hours. Hemoglobin A1C No results for input(s): HGBA1C in the last 72 hours. Fasting Lipid Panel No results for input(s): CHOL, HDL, LDLCALC, TRIG, CHOLHDL, LDLDIRECT in the last 72 hours. Thyroid Function Tests No results for input(s): TSH, T4TOTAL, T3FREE, THYROIDAB in the last 72 hours.  Invalid input(s): FREET3  Telemetry    Sinus in 60s, short run of NSVT - Personally Reviewed  ECG    Sinus with mod LVH, HR 68bpm  - Personally Reviewed  Radiology    ECHOCARDIOGRAM LIMITED  Result Date: 12/26/2020     ECHOCARDIOGRAM LIMITED REPORT   Patient Name:   Rose Miller Date of Exam: 12/26/2020 Medical Rec #:  [id number]      Height:       70.0 in Accession #:    [id number]     Weight:       340.0 lb Date of Birth:  [birth year]     BSA:          2.615 m Patient Age:    65 years       BP:           165/61 mmHg Patient Gender: F              HR:           63 bpm. Exam Location:  Inpatient Procedure: Limited Echo, Color Doppler and Cardiac Doppler Indications:     Aortic Stenosis i35.0  History:         Patient has prior history of Echocardiogram examinations, most                  recent 11/15/2020. Risk Factors:Hypertension, Diabetes and                  Dyslipidemia.                  Aortic Valve: 26 mm Edwards Ultra, stented (TAVR) valve is                  present in the aortic position. Procedure Date: 12/26/2020.  Sonographer:     Raquel Sarna Senior RDCS Referring Phys:  [id number] Early Osm[place] Diagnosing Phys: Sanda Klein MD  Sonographer Comments: 44mm Edwards S3U TAVR Implanted IMPRESSIONS  1. Left ventricular ejection fraction, by estimation, is 65 to 70%. The left ventricle has normal function. The left ventricle has no regional wall motion abnormalities. There is mild concentric left ventricular hypertrophy. Left ventricular diastolic function could not be evaluated.  2. Right ventricular systolic function is normal. The right ventricular size is normal.  3. Left atrial size was moderately dilated.  4. The mitral valve is degenerative. Moderate mitral valve regurgitation. Moderate mitral annular calcification.  5. Tricuspid valve regurgitation is mild to moderate.  6. The aortic valve has been repaired/replaced. There is a 26 mm Edwards Ultra, stented (TAVR) valve present in the aortic position. Procedure Date: 12/26/2020. Echo findings are consistent with normal structure and function of the aortic valve prosthesis. PRE-PROCEDURAL FINDINGS:  Normal left ventricular systolic function. Estimated LVEF 60-65%. There  are no regional wall motion abnormalities. Moderate-to-severe calcific aortic stenosis. Trileaflet aortic valve. Peak aortic valve gradient 43 mm Hg, mean gradient 28 mm Hg. Dimensionless obstructive index 0.41, calculated aortic valve area is 1.29 cm (  index for BSA 0.5 cm/m). Severe aortic insufficiency. No pericardial effusion.  POST-PROCEDURAL FINDINGS:  Hyperdynamic left ventricular systolic function. Estimated LVEF 65-70%. There are no regional wall motion abnormalities. There is no evidence for dynamic LVOT obstruction. Well-seated TAVR stent-valve. Peak aortic valve gradient 11 mm Hg, mean gradient 7 mm Hg. Dimensionless obstructive index 0.79, calculated aortic valve area is 2.58 cm (index for BSA 0.99 cm/m). There is no aortic insufficiency and no perivalvular leak. Mild mitral insufficiency. No pericardial effusion.  FINDINGS  Left Ventricle: Left ventricular ejection fraction, by estimation, is 65 to 70%. The left ventricle has normal function. The left ventricle has no regional wall motion abnormalities. The left ventricular internal cavity size was normal in size. There is  mild concentric left ventricular hypertrophy. Left ventricular diastolic function could not be evaluated. Right Ventricle: The right ventricular size is normal. No increase in right ventricular wall thickness. Right ventricular systolic function is normal. Left Atrium: Left atrial size was moderately dilated. Right Atrium: Right atrial size was normal in size. Pericardium: There is no evidence of pericardial effusion. Mitral Valve: The mitral valve is degenerative in appearance. Moderate mitral annular calcification. Moderate mitral valve regurgitation. Tricuspid Valve: The tricuspid valve is grossly normal. Tricuspid valve regurgitation is mild to moderate. Aortic Valve: The aortic valve has been repaired/replaced. Aortic regurgitation PHT measures 271 msec. Aortic valve mean gradient measures 7.0 mmHg. Aortic valve peak  gradient measures 11.4 mmHg. Aortic valve area, by VTI measures 2.58 cm. There is a 26  mm Edwards Ultra, stented (TAVR) valve present in the aortic position. Procedure Date: 12/26/2020. Echo findings are consistent with normal structure and function of the aortic valve prosthesis. Pulmonic Valve: The pulmonic valve was grossly normal. Pulmonic valve regurgitation is trivial. Aorta: The aortic root is normal in size and structure. IAS/Shunts: The interatrial septum was not assessed. LEFT VENTRICLE PLAX 2D LVOT diam:     2.01 cm LV SV:         119 LV SV Index:   46 LVOT Area:     3.17 cm  AORTIC VALVE AV Area (Vmax):    2.52 cm AV Area (Vmean):   2.62 cm AV Area (VTI):     2.58 cm AV Vmax:           169.00 cm/s AV Vmean:          121.000 cm/s AV VTI:            0.462 m AV Peak Grad:      11.4 mmHg AV Mean Grad:      7.0 mmHg LVOT Vmax:         134.00 cm/s LVOT Vmean:        100.000 cm/s LVOT VTI:          0.376 m LVOT/AV VTI ratio: 0.81 AI PHT:            271 msec  SHUNTS Systemic VTI:  0.38 m Systemic Diam: 2.01 cm Sanda Klein MD Electronically signed by Sanda Klein MD Signature Date/Time: 12/26/2020/2:41:58 PM    Final    Structural Heart Procedure  Result Date: 12/26/2020 See surgical note for result.   Cardiac Studies   TAVR OPERATIVE NOTE     Date of Procedure:                12/26/2020   Preoperative Diagnosis:      Severe Aortic Stenosis    Postoperative Diagnosis:    Same    Procedure:  Transcatheter Aortic Valve Replacement - Percutaneous Right Transfemoral Approach             Edwards Sapien 3 Ultra Resilia THV (size 26 mm, model # 9755RSL, serial # [id number])              Co-Surgeons:                        Gaye Pollack, MD and Lenna Sciara, MD     Anesthesiologist:                  Gennie Alma, MD   Echocardiographer:              Bertrum Sol, MD   Pre-operative Echo Findings: Severe aortic stenosis Normal left ventricular systolic function    Post-operative Echo Findings: No paravalvular leak Normal left ventricular systolic function   ______________________  Echo 12/27/20: pending  Patient Profile     Rose Miller is a 65 y.o. 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  Assessment & Plan    Severe AS/AI: s/p successful TAVR with a 26 mm Edwards Sapien 3 Ultra Resilia THV via the TF approach on 12/26/20. Post operative echo completed but pending formal read. Groin sites are stable. ECG with sinus and no high grade heart block. Continue Asprin. Plan to get her up walking around and potentially home later today.   HTN: BP elevated and started on a nitro gtt. Will wean this and restart home meds including Losartan, Toprol XL and Norvasc.  Sinus bradycardia: had marked sinus bradycardia post operatively with a 4.5 second pause. HR has improved into the 60s. Will restart home Toprol and watch. I will place a zio patch prior to discharge.   Temperature: pt had a mild temp overnight, Tmax 100.8. White count is normal. Will check UA and blood cultures.   Morbid obesity: Body mass index is 49.79 kg/m.   Signed, Angelena Form, PA-C  12/27/2020, 8:51 AM  Pager [phone]  ATTENDING ATTESTATION:  After conducting a review of all available clinical information with the care team, interviewing the patient, and performing a physical exam, I agree with the findings and plan described in this note.  Patient doing well after TAVR.  Bradycardia now resolved.  Mild fever overnight, will check UA and Bcx; f/u TTE today with likely discharge.  Monitor to be placed on discharge to evaluate heart rate.  Lenna Sciara, MD Pager [phone]

## 2020-12-27 NOTE — Progress Notes (Signed)
Applied in hospital. Dr. [name] to read.

## 2020-12-27 NOTE — Progress Notes (Signed)
CARDIAC REHAB PHASE I   TAVR education completed with pt and son. Pt educated on site care and monitoring incisions daily. Encouraged ambulation with emphasis on safety. Pt states an improvement in her breathing already. Reviewed restrictions. Pt declines CRP II at this time, d/c today.   [id number] Rufina Falco, RN BSN 12/27/2020 1:51 PM

## 2020-12-28 ENCOUNTER — Other Ambulatory Visit (HOSPITAL_COMMUNITY): Payer: Self-pay

## 2020-12-28 ENCOUNTER — Telehealth: Payer: Self-pay | Admitting: Physician Assistant

## 2020-12-28 ENCOUNTER — Other Ambulatory Visit: Payer: Self-pay | Admitting: Physician Assistant

## 2020-12-28 DIAGNOSIS — I10 Essential (primary) hypertension: Secondary | ICD-10-CM

## 2020-12-28 MED ORDER — HYDRALAZINE HCL 50 MG PO TABS: 50.0000 mg | ORAL_TABLET | Freq: Three times a day (TID) | ORAL | 3 refills | Status: DC

## 2020-12-28 NOTE — Telephone Encounter (Signed)
°  [place] VALVE TEAM   Patient contacted regarding discharge from [facility] on 12/14  Patient understands to follow up with a structural heart APP on 12/21 at [place].  Patient understands discharge instructions? yes Patient understands medications and regimen? yes Patient understands to bring all medications to this visit? yes  Angelena Form PA-C  [facility]

## 2020-12-30 ENCOUNTER — Encounter (HOSPITAL_COMMUNITY): Payer: Self-pay | Admitting: Emergency Medicine

## 2020-12-30 ENCOUNTER — Emergency Department (HOSPITAL_COMMUNITY)
Admission: EM | Admit: 2020-12-30 | Discharge: 2020-12-30 | Disposition: A | Discharge status: Home / Self Care | Payer: Medicare Other | Attending: Emergency Medicine | Admitting: Emergency Medicine

## 2020-12-30 ENCOUNTER — Emergency Department (HOSPITAL_COMMUNITY): Payer: Medicare Other

## 2020-12-30 ENCOUNTER — Telehealth: Payer: Self-pay | Admitting: Cardiology

## 2020-12-30 ENCOUNTER — Other Ambulatory Visit: Payer: Self-pay

## 2020-12-30 DIAGNOSIS — E1122 Type 2 diabetes mellitus with diabetic chronic kidney disease: Secondary | ICD-10-CM | POA: Diagnosis not present

## 2020-12-30 DIAGNOSIS — D649 Anemia, unspecified: Secondary | ICD-10-CM | POA: Insufficient documentation

## 2020-12-30 DIAGNOSIS — R0602 Shortness of breath: Secondary | ICD-10-CM | POA: Insufficient documentation

## 2020-12-30 DIAGNOSIS — J45909 Unspecified asthma, uncomplicated: Secondary | ICD-10-CM | POA: Insufficient documentation

## 2020-12-30 DIAGNOSIS — I4892 Unspecified atrial flutter: Secondary | ICD-10-CM | POA: Diagnosis not present

## 2020-12-30 DIAGNOSIS — Z79899 Other long term (current) drug therapy: Secondary | ICD-10-CM | POA: Diagnosis not present

## 2020-12-30 DIAGNOSIS — I129 Hypertensive chronic kidney disease with stage 1 through stage 4 chronic kidney disease, or unspecified chronic kidney disease: Secondary | ICD-10-CM | POA: Insufficient documentation

## 2020-12-30 DIAGNOSIS — Z87891 Personal history of nicotine dependence: Secondary | ICD-10-CM | POA: Insufficient documentation

## 2020-12-30 DIAGNOSIS — J449 Chronic obstructive pulmonary disease, unspecified: Secondary | ICD-10-CM | POA: Insufficient documentation

## 2020-12-30 DIAGNOSIS — Z794 Long term (current) use of insulin: Secondary | ICD-10-CM | POA: Insufficient documentation

## 2020-12-30 DIAGNOSIS — N183 Chronic kidney disease, stage 3 unspecified: Secondary | ICD-10-CM | POA: Insufficient documentation

## 2020-12-30 DIAGNOSIS — R002 Palpitations: Secondary | ICD-10-CM | POA: Diagnosis present

## 2020-12-30 LAB — COMPREHENSIVE METABOLIC PANEL
ALT: 14 U/L (ref 0–44)
AST: 18 U/L (ref 15–41)
Albumin: 3.4 g/dL — ABNORMAL LOW (ref 3.5–5.0)
Alkaline Phosphatase: 60 U/L (ref 38–126)
Anion gap: 9 (ref 5–15)
BUN: 7 mg/dL — ABNORMAL LOW (ref 8–23)
CO2: 25 mmol/L (ref 22–32)
Calcium: 9 mg/dL (ref 8.9–10.3)
Chloride: 106 mmol/L (ref 98–111)
Creatinine, Ser: 1.16 mg/dL — ABNORMAL HIGH (ref 0.44–1.00)
GFR, Estimated: 52 mL/min — ABNORMAL LOW (ref >60)
Glucose, Bld: 165 mg/dL — ABNORMAL HIGH (ref 70–99)
Potassium: 3.7 mmol/L (ref 3.5–5.1)
Sodium: 140 mmol/L (ref 135–145)
Total Bilirubin: 0.7 mg/dL (ref 0.3–1.2)
Total Protein: 7.4 g/dL (ref 6.5–8.1)

## 2020-12-30 LAB — TROPONIN I (HIGH SENSITIVITY)
Troponin I (High Sensitivity): 82 ng/L — ABNORMAL HIGH (ref <18)
Troponin I (High Sensitivity): 66 ng/L — ABNORMAL HIGH (ref <18)

## 2020-12-30 LAB — MAGNESIUM: Magnesium: 2 mg/dL (ref 1.7–2.4)

## 2020-12-30 LAB — CBC
HCT: 36 % (ref 36.0–46.0)
Hemoglobin: 11.1 g/dL — ABNORMAL LOW (ref 12.0–15.0)
MCH: 26.6 pg (ref 26.0–34.0)
MCHC: 30.8 g/dL (ref 30.0–36.0)
MCV: 86.1 fL (ref 80.0–100.0)
Platelets: 170 10*3/uL (ref 150–400)
RBC: 4.18 MIL/uL (ref 3.87–5.11)
RDW: 16.4 % — ABNORMAL HIGH (ref 11.5–15.5)
WBC: 6.8 10*3/uL (ref 4.0–10.5)
nRBC: 0 % (ref 0.0–0.2)

## 2020-12-30 LAB — TSH: TSH: 1.311 u[IU]/mL (ref 0.350–4.500)

## 2020-12-30 LAB — T4, FREE: Free T4: 0.91 ng/dL (ref 0.61–1.12)

## 2020-12-30 LAB — BRAIN NATRIURETIC PEPTIDE: B Natriuretic Peptide: 43.6 pg/mL (ref 0.0–100.0)

## 2020-12-30 MED ORDER — DILTIAZEM HCL 30 MG PO TABS: 30.0000 mg | ORAL_TABLET | Freq: Four times a day (QID) | ORAL | 0 refills | Status: DC | PRN

## 2020-12-30 MED ORDER — APIXABAN 5 MG PO TABS: 5.0000 mg | ORAL_TABLET | Freq: Two times a day (BID) | ORAL | 0 refills | Status: DC

## 2020-12-30 MED ORDER — APIXABAN 5 MG PO TABS
5.0000 mg | ORAL_TABLET | Freq: Once | ORAL | Status: AC
  Administered 2020-12-30: 5 mg via ORAL
  Filled 2020-12-30: qty 1

## 2020-12-30 NOTE — Consult Note (Signed)
Cardiology Admission History and Physical:   Patient ID: Rose Miller MRN: [id number]; DOB: 1955-09-14   Admission date: 12/30/2020  PCP:  Flossie Buffy, NP   [facility] Providers Cardiologist:  Sinclair Grooms, MD       Chief Complaint:  New Arrhythmia post TAVR  Patient Profile:   Rose Miller is a 65 y.o. female with a history of Severe AS s/p recent TAVR complicated by prolonged pause (4.5 Sinus pause) , HTN with DM and Morbid Obesity, Hld with DM, CKD stage IIIa, and COPD who is being seen 12/30/2020 for the evaluation of new AF.  History of Present Illness:   [name] notes that she is feeling much better now.  Has had no chest pain, chest pressure, chest tightness, chest stinging.  Was having new allergies post TAVR discharge and family picked up [place].  After this found to have new palpitations.  Did not trigger her monitor (she is still wearing her heart monitor secondary to her sinus pause).  Patient exertion notable for doing ADLs with  and feels no symptoms.  No shortness of breath (feels better post TAVR).  No syncope or near syncope  Inpatient evaluation because of AFL RVR.   In the [place] patient received medications no medications prior to our evaluation. Key labs notable for novel troponin testing < 100.  Key imaging includes CXR- mild cardiomegaly with no effusions.   Cardiology called for evaluation.    Past Medical History:  Diagnosis Date   Adhesive capsulitis    Aortic valve disease    Arthritis    Asthma    Back pain    CKD (chronic kidney disease) stage 3, GFR 30-59 ml/min (HCC)    Constipation    COPD (chronic obstructive pulmonary disease) (HCC)    Diabetes mellitus without complication (HCC)    Diuretic-induced hypokalemia    Hypercholesterolemia    Hypertension    IT band syndrome    Morbid obesity (HCC)    Palpitations    Periumbilical pain    Postmenopausal estrogen deficiency    Prediabetes    Ventral hernia      Past Surgical History:  Procedure Laterality Date   CESAREAN SECTION  1989   COLONOSCOPY     INTRAOPERATIVE TRANSTHORACIC ECHOCARDIOGRAM N/A 12/26/2020   Procedure: INTRAOPERATIVE TRANSTHORACIC ECHOCARDIOGRAM;  Surgeon: Early Osmond, MD;  Location: [place] [facility];  Service: Open Heart Surgery;  Laterality: N/A;   RIGHT/LEFT HEART CATH AND CORONARY ANGIOGRAPHY N/A 11/24/2020   Procedure: RIGHT/LEFT HEART CATH AND CORONARY ANGIOGRAPHY;  Surgeon: Belva Crome, MD;  Location: [place] [facility];  Service: Cardiovascular;  Laterality: N/A;   TONSILLECTOMY AND ADENOIDECTOMY  1960   TRANSCATHETER AORTIC VALVE REPLACEMENT, TRANSFEMORAL N/A 12/26/2020   Procedure: TRANSCATHETER AORTIC VALVE REPLACEMENT, TRANSFEMORAL;  Surgeon: Early Osmond, MD;  Location: [place] [facility];  Service: Open Heart Surgery;  Laterality: N/A;     Medications Prior to Admission: Prior to Admission medications   Medication Sig Start Date End Date Taking? Authorizing Provider  amLODipine (NORVASC) 10 MG tablet Take 1 tablet (10 mg total) by mouth daily. 12/12/20   [name], [name], NP  Ascorbic Acid (VITAMIN C) 1000 MG tablet Take 1,000 mg by mouth daily.    [provider]  aspirin 81 MG chewable tablet Chew 1 tablet (81 mg total) by mouth daily. Okay to swallow whole 12/28/20   Early Osmond, MD  atorvastatin (LIPITOR) 10 MG tablet Take 1  tablet (10 mg total) by mouth daily. 11/10/20   Belva Crome, MD  Bayer Microlet Lancets lancets Use when testing glucose as recommended. 11/27/18   [provider]  Blood Pressure Monitoring (SPHYGMOMANOMETER) MISC 1 Units by Does not apply route daily. 07/30/16   [name], [name], NP  Cholecalciferol 50 MCG (2000 UT) TABS Take 2,000 Units by mouth daily.    [provider]  Coenzyme Q10 (CO Q-10) 100 MG CAPS Take 100 mg by mouth daily.     [provider]  Cyanocobalamin (B-12) 500 MCG TABS Take 500 mcg by mouth daily.     [provider]  glucose blood (CONTOUR NEXT TEST) test strip Test up to 4 times per day as recommended 11/27/18   [provider]  hydrALAZINE (APRESOLINE) 50 MG tablet Take 1 tablet (50 mg total) by mouth 3 (three) times daily. 12/28/20   Eileen Stanford, PA-C  Insulin Pen Needle (BD PEN NEEDLE NANO 2ND GEN) 32G X 4 MM MISC 1 Package by Does not apply route 2 (two) times daily. 03/15/20   Laqueta Linden, MD  losartan (COZAAR) 100 MG tablet Take 1 tablet (100 mg total) by mouth daily. 12/12/20   [name], [name], NP  Menthol, Topical Analgesic, (BLUE-EMU MAXIMUM STRENGTH) 2.5 % LIQD Apply 1 application topically daily as needed (pain).    [provider]  neomycin-polymyxin-hydrocortisone (CORTISPORIN) OTIC solution Apply 1-2 drops to toenail if there is soreness once daily after shower Patient taking differently: 1-2 drops See admin instructions. Apply 1-2 drops to toenail if there is soreness once daily after shower as needed 07/27/20   Landis Martins, DPM  Zinc 50 MG CAPS Take 50 mg by mouth.    [provider]     Allergies:   No Known Allergies  Social History:   Social History   Socioeconomic History   Marital status: Single    Spouse name: Not on file   Number of children: 1   Years of education: Not on file   Highest education level: Not on file  Occupational History   Occupation: retired  Tobacco Use   Smoking status: Former    Types: Cigarettes   Smokeless tobacco: Never  Scientific laboratory technician Use: Never used  Substance and Sexual Activity   Alcohol use: Yes    Comment: socially   Drug use: No   Sexual activity: Not on file  Other Topics Concern   Not on file  Social History Narrative   Not on file   Social Determinants of Health   Financial Resource Strain: Medium Risk   Difficulty of Paying Living Expenses: Somewhat hard  Food Insecurity: No Food Insecurity   Worried About Charity fundraiser in the Last Year:  Never true   [place] in the Last Year: Never true  Transportation Needs: No Transportation Needs   Lack of Transportation (Medical): No   Lack of Transportation (Non-Medical): No  Physical Activity: Inactive   Days of Exercise per Week: 0 days   Minutes of Exercise per Session: 0 min  Stress: No Stress Concern Present   Feeling of Stress : Not at all  Social Connections: Socially Isolated   Frequency of Communication with Friends and Family: More than three times a week   Frequency of Social Gatherings with Friends and Family: Once a week   Attends Religious Services: Never   Marine scientist or Organizations: No   Attends [employer]  or Organization Meetings: Never   Marital Status: Never married  Human resources officer Violence: Not At Risk   Fear of Current or Ex-Partner: No   Emotionally Abused: No   Physically Abused: No   Sexually Abused: No    Family History:   The patient's family history includes Arthritis in her mother; Cancer in her father and mother; Colon cancer in her father; Heart disease in her father; Hypertension in her mother; Obesity in her mother; Thyroid disease in her father.    ROS:  Please see the history of present illness.  All other ROS reviewed and negative.     Physical Exam/Data:   Vitals:   12/30/20 1330 12/30/20 1345 12/30/20 1400 12/30/20 1415  BP: (!) 149/65 (!) 133/112 106/73 111/64  Pulse: 85 75 74 71  Resp: 20 20 (!) 22 (!) 21  Temp:      TempSrc:      SpO2: 97% 97% 96% 97%   No intake or output data in the 24 hours ending 12/30/20 1507 Last 3 Weights 12/27/2020 12/26/2020 12/22/2020  Weight (lbs) 347 lb 0.1 oz 340 lb 341 lb 8 oz  Weight (kg) 157.4 kg 154.223 kg 154.903 kg     There is no height or weight on file to calculate BMI.   Gen: no distress, morbid obesity   Neck: No JVD Cardiac: No Rubs or Gallops, soft systolic Murmur, regular rate and rhythm, +2 radial pulses, no hematoma (r groin) Respiratory: Clear to auscultation  bilaterally, normal effort, normal  respiratory rate GI: Soft, nontender, non-distended  MS: No  edema;  moves all extremities Integument: Skin feels warm Neuro:  At time of evaluation, alert and oriented to person/place/time/situation  Psych: Normal affect, patient feels fine   EKG:  The ECG that was done  was personally reviewed and demonstrates AFL with variable conduction rate 139-> SR (note the change in p wave axis)   Laboratory Data:  High Sensitivity Troponin:   Recent Labs  Lab 12/30/20 1003 12/30/20 1312  TROPONINIHS 82* 66*      Chemistry Recent Labs  Lab 12/27/20 0123 12/30/20 1003 12/30/20 1312  NA 135 140  --   K 4.3 3.7  --   CL 104 106  --   CO2 22 25  --   GLUCOSE 89 165*  --   BUN 14 7*  --   CREATININE 1.00 1.16*  --   CALCIUM 9.0 9.0  --   MG  --   --  2.0  GFRNONAA >60 52*  --   ANIONGAP 9 9  --     Recent Labs  Lab 12/30/20 1003  PROT 7.4  ALBUMIN 3.4*  AST 18  ALT 14  ALKPHOS 60  BILITOT 0.7   Lipids No results for input(s): CHOL, TRIG, HDL, LABVLDL, LDLCALC, CHOLHDL in the last 168 hours. Hematology Recent Labs  Lab 12/27/20 0123 12/30/20 1003  WBC 6.6 6.8  RBC 3.88 4.18  HGB 10.6* 11.1*  HCT 33.2* 36.0  MCV 85.6 86.1  MCH 27.3 26.6  MCHC 31.9 30.8  RDW 16.2* 16.4*  PLT 161 170   Thyroid  Recent Labs  Lab 12/30/20 1312  FREET4 0.91   BNP Recent Labs  Lab 12/30/20 1003  BNP 43.6    DDimer No results for input(s): DDIMER in the last 168 hours.   Radiology/Studies:  DG Chest 2 View  Result Date: 12/30/2020 CLINICAL DATA:  Shortness of breath EXAM: CHEST - 2 VIEW COMPARISON:  Chest x-ray  12/22/2020 FINDINGS: Heart is mildly enlarged. Mediastinum appears stable. Calcified plaques in the aortic arch. Metallic device in the left anterior chest wall. No focal consolidation identified. No pleural effusion or pneumothorax. IMPRESSION: Mild cardiomegaly with no acute process identified. Electronically Signed   By: Ofilia Neas M.D.   On: 12/30/2020 10:26     Assessment and Plan:   AFL (Paroxysmal) potential in the setting of Afrin use Severe AS s/p recent TAVR complicated by prolonged pause (4.5 Sinus pause) at last admission HTN with DM and Morbid Obesity Hld with DM CKD stage IIIa COPD  -elevated CHADVASC with no bleeding issues, would start DOAC -stop ASA - given her prior pause I offered Obs eval for standing AV nodal agents and to monitor for pause vs continued heart monitor (she is wearing it now), PRN diltiazem 30 mg PO Q6hr PRN palpitations; she was amenable to outpatient follow up (has 01/03/21 f/u visit) -stop Afrin - no change in other medications    For questions or updates, please contact [place] Please consult [email or web address] for contact info under     Signed, Werner Lean, MD  12/30/2020 3:07 PM

## 2020-12-30 NOTE — ED Notes (Signed)
Pt provided discharge instructions and prescription information. Pt was given the opportunity to ask questions and questions were answered. Discharge signature not obtained in the setting of the COVID-19 pandemic in order to reduce high touch surfaces.  ° °

## 2020-12-30 NOTE — ED Provider Notes (Signed)
Emergency Medicine Provider Triage Evaluation Note  Rose Miller , a 65 y.o. female  was evaluated in triage.  Pt complains of palpitations. Status post TAVR on Tuesday without complication, she began to feel slightly more DOE yesterday and into today with sudden onset feeling of palpitations since 8am this morning. She states she feels her heart is beating irregularly as well. Denies any pain, fevers, chills, diaphoresis, n/v/d. No leg or abdominal swelling, no cough.  Review of Systems  Positive: Palpitations, shortness of breath Negative: Chest pain, leg swelling  Physical Exam  BP 120/80 (BP Location: Right Arm)    Pulse 85    Temp 98.6 F (37 C) (Oral)    Resp 18    SpO2 97%  Gen:   Awake, no distress   Resp:  Normal effort  MSK:   Moves extremities without difficulty  Other:    Medical Decision Making  Medically screening exam initiated at 9:56 AM.  Appropriate orders placed.  Rose Miller was informed that the remainder of the evaluation will be completed by another provider, this initial triage assessment does not replace that evaluation, and the importance of remaining in the ED until their evaluation is complete.     Nestor Lewandowsky 12/30/20 1002    Pattricia Boss, MD 01/01/21 [phone]

## 2020-12-30 NOTE — ED Provider Notes (Signed)
[facility] EMERGENCY DEPARTMENT Provider Note   CSN: [id number] Arrival date & time: 12/30/20  7915     History Chief Complaint  Patient presents with   Irregular Heart Beat    Rose Miller is a 65 y.o. female.  HPI Patient is a 65 year old female presented to ER today with complaints of heart palpitations that began around 8 AM this morning.  She states that she has not had any chest pain, pressure or tightness denies any difficulty breathing syncope or near syncope.  She did have a TAVR done 5 days ago.  States that she has not had any issues since TAVR.  Denies any groin pain or bleeding.  States that she chronically feels somewhat short of breath no changes.  Denies any other associate symptoms.  No aggravating or mitigating factors.  She states that she has had on constant palpitations since it began.     Past Medical History:  Diagnosis Date   Adhesive capsulitis    Aortic valve disease    Arthritis    Asthma    Back pain    CKD (chronic kidney disease) stage 3, GFR 30-59 ml/min (HCC)    Constipation    COPD (chronic obstructive pulmonary disease) (HCC)    Diabetes mellitus without complication (HCC)    Diuretic-induced hypokalemia    Hypercholesterolemia    Hypertension    IT band syndrome    Morbid obesity (HCC)    Palpitations    Periumbilical pain    Postmenopausal estrogen deficiency    Prediabetes    Ventral hernia     Patient Active Problem List   Diagnosis Date Noted   Aortic valve disease 12/26/2020   COPD (chronic obstructive pulmonary disease) ([place]) 12/26/2020   S/P TAVR (transcatheter aortic valve replacement) 12/26/2020   Postmenopausal estrogen deficiency 02/18/2019   Former smoker 05/13/2018   Long-term use of aspirin therapy 05/13/2018   Class 3 severe obesity with serious comorbidity and body mass index (BMI) of 45.0 to 49.9 in adult ([place]) 03/31/2017   DM (diabetes mellitus) ([place]) 03/31/2017   CKD (chronic kidney  disease) stage 3, GFR 30-59 ml/min (HCC) 03/31/2017   Hypercholesterolemia 05/69/7948   Periumbilical pain 01/65/5374   IT band syndrome 09/11/2014   Right shoulder pain 09/11/2014   Ventral hernia 05/01/2014   Essential hypertension 03/17/2014    Past Surgical History:  Procedure Laterality Date   [place]   COLONOSCOPY     INTRAOPERATIVE TRANSTHORACIC ECHOCARDIOGRAM N/A 12/26/2020   Procedure: INTRAOPERATIVE TRANSTHORACIC ECHOCARDIOGRAM;  Surgeon: Early Osmond, MD;  Location: [facility];  Service: Open Heart Surgery;  Laterality: N/A;   RIGHT/LEFT HEART CATH AND CORONARY ANGIOGRAPHY N/A 11/24/2020   Procedure: RIGHT/LEFT HEART CATH AND CORONARY ANGIOGRAPHY;  Surgeon: Belva Crome, MD;  Location: [facility];  Service: Cardiovascular;  Laterality: N/A;   TONSILLECTOMY AND ADENOIDECTOMY  1960   TRANSCATHETER AORTIC VALVE REPLACEMENT, TRANSFEMORAL N/A 12/26/2020   Procedure: TRANSCATHETER AORTIC VALVE REPLACEMENT, TRANSFEMORAL;  Surgeon: Early Osmond, MD;  Location: [facility];  Service: Open Heart Surgery;  Laterality: N/A;     OB History     Gravida  1   Para      Term      Preterm      AB      Living         SAB      IAB      Ectopic  Multiple      Live Births              Family History  Problem Relation Age of Onset   Arthritis Mother    Cancer Mother        pancreatic   Hypertension Mother    Obesity Mother    Cancer Father        prostate and colon   Colon cancer Father    Heart disease Father    Thyroid disease Father     Social History   Tobacco Use   Smoking status: Former    Types: Cigarettes   Smokeless tobacco: Never  Vaping Use   Vaping Use: Never used  Substance Use Topics   Alcohol use: Yes    Comment: socially   Drug use: No    Home Medications Prior to Admission medications   Medication Sig Start Date End Date Taking? Authorizing Provider  apixaban (ELIQUIS) 5 MG  TABS tablet Take 1 tablet (5 mg total) by mouth 2 (two) times daily. 12/30/20 01/29/21 Yes Krikor Willet S, PA  diltiazem (CARDIZEM) 30 MG tablet Take 1 tablet (30 mg total) by mouth every 6 (six) hours as needed (fast heart rate, heart palpitations). 12/30/20  Yes Chudney Scheffler S, PA  amLODipine (NORVASC) 10 MG tablet Take 1 tablet (10 mg total) by mouth daily. 12/12/20   [name], [name], NP  Ascorbic Acid (VITAMIN C) 1000 MG tablet Take 1,000 mg by mouth daily.    [provider]  atorvastatin (LIPITOR) 10 MG tablet Take 1 tablet (10 mg total) by mouth daily. 11/10/20   Belva Crome, MD  Bayer Microlet Lancets lancets Use when testing glucose as recommended. 11/27/18   [provider]  Blood Pressure Monitoring (SPHYGMOMANOMETER) MISC 1 Units by Does not apply route daily. 07/30/16   [name], [name], NP  Cholecalciferol 50 MCG (2000 UT) TABS Take 2,000 Units by mouth daily.    [provider]  Coenzyme Q10 (CO Q-10) 100 MG CAPS Take 100 mg by mouth daily.     [provider]  Cyanocobalamin (B-12) 500 MCG TABS Take 500 mcg by mouth daily.    [provider]  glucose blood (CONTOUR NEXT TEST) test strip Test up to 4 times per day as recommended 11/27/18   [provider]  hydrALAZINE (APRESOLINE) 50 MG tablet Take 1 tablet (50 mg total) by mouth 3 (three) times daily. 12/28/20   Eileen Stanford, PA-C  Insulin Pen Needle (BD PEN NEEDLE NANO 2ND GEN) 32G X 4 MM MISC 1 Package by Does not apply route 2 (two) times daily. 03/15/20   Laqueta Linden, MD  losartan (COZAAR) 100 MG tablet Take 1 tablet (100 mg total) by mouth daily. 12/12/20   [name], [name], NP  Menthol, Topical Analgesic, (BLUE-EMU MAXIMUM STRENGTH) 2.5 % LIQD Apply 1 application topically daily as needed (pain).    [provider]  neomycin-polymyxin-hydrocortisone (CORTISPORIN) OTIC solution Apply 1-2 drops to toenail if there is soreness once daily  after shower Patient taking differently: 1-2 drops See admin instructions. Apply 1-2 drops to toenail if there is soreness once daily after shower as needed 07/27/20   Landis Martins, DPM  Zinc 50 MG CAPS Take 50 mg by mouth.    [provider]    Allergies    Patient has no known allergies.  Review of Systems   Review of Systems  Constitutional:  Negative for chills and fever.  HENT:  Negative for congestion.   Eyes:  Negative for pain.  Respiratory:  Negative for cough and shortness of breath.   Cardiovascular:  Positive for palpitations. Negative for chest pain and leg swelling.  Gastrointestinal:  Negative for abdominal pain and vomiting.  Genitourinary:  Negative for dysuria.  Musculoskeletal:  Negative for myalgias.  Skin:  Negative for rash.  Neurological:  Negative for dizziness and headaches.   Physical Exam Updated Vital Signs BP (!) 108/48    Pulse 67    Temp 98.6 F (37 C) (Oral)    Resp (!) 23    SpO2 97%   Physical Exam Vitals and nursing note reviewed.  Constitutional:      General: She is not in acute distress. HENT:     Head: Normocephalic and atraumatic.     Nose: Nose normal.  Eyes:     General: No scleral icterus. Cardiovascular:     Rate and Rhythm: Normal rate.     Pulses: Normal pulses.     Heart sounds: Normal heart sounds.     Comments: Irregular, HR 110 Pulmonary:     Effort: Pulmonary effort is normal. No respiratory distress.     Breath sounds: No wheezing.  Abdominal:     Palpations: Abdomen is soft.     Tenderness: There is no abdominal tenderness.  Musculoskeletal:     Cervical back: Normal range of motion.     Right lower leg: No edema.     Left lower leg: No edema.  Skin:    General: Skin is warm and dry.     Capillary Refill: Capillary refill takes less than 2 seconds.  Neurological:     Mental Status: She is alert. Mental status is at baseline.  Psychiatric:        Mood and Affect: Mood normal.        Behavior:  Behavior normal.    ED Results / Procedures / Treatments   Labs (all labs ordered are listed, but only abnormal results are displayed) Labs Reviewed  CBC - Abnormal; Notable for the following components:      Result Value   Hemoglobin 11.1 (*)    RDW 16.4 (*)    All other components within normal limits  COMPREHENSIVE METABOLIC PANEL - Abnormal; Notable for the following components:   Glucose, Bld 165 (*)    BUN 7 (*)    Creatinine, Ser 1.16 (*)    Albumin 3.4 (*)    GFR, Estimated 52 (*)    All other components within normal limits  TROPONIN I (HIGH SENSITIVITY) - Abnormal; Notable for the following components:   Troponin I (High Sensitivity) 82 (*)    All other components within normal limits  TROPONIN I (HIGH SENSITIVITY) - Abnormal; Notable for the following components:   Troponin I (High Sensitivity) 66 (*)    All other components within normal limits  BRAIN NATRIURETIC PEPTIDE  MAGNESIUM  TSH  T4, FREE    EKG EKG Interpretation  Date/Time:  Saturday December 30 2020 14:59:33 EST Ventricular Rate:  70 PR Interval:  185 QRS Duration: 96 QT Interval:  414 QTC Calculation: 447 R Axis:   10 Text Interpretation: Sinus rhythm Left ventricular hypertrophy Confirmed by Pattricia Boss [phone]) on 12/30/2020 3:07:34 PM  Radiology DG Chest 2 View  Result Date: 12/30/2020 CLINICAL DATA:  Shortness of breath EXAM: CHEST - 2 VIEW COMPARISON:  Chest x-ray 12/22/2020 FINDINGS: Heart is mildly enlarged. Mediastinum appears stable. Calcified plaques in the aortic  arch. Metallic device in the left anterior chest wall. No focal consolidation identified. No pleural effusion or pneumothorax. IMPRESSION: Mild cardiomegaly with no acute process identified. Electronically Signed   By: Ofilia Neas M.D.   On: 12/30/2020 10:26    Procedures Procedures   Medications Ordered in ED Medications  apixaban (ELIQUIS) tablet 5 mg (5 mg Oral Given 12/30/20 1511)    ED Course  I have  reviewed the triage vital signs and the nursing notes.  Pertinent labs & imaging results that were available during my care of the patient were reviewed by me and considered in my medical decision making (see chart for details).  Clinical Course as of 12/30/20 1537  Sat Dec 30, 2020  1249 Discussed with Dr. Gasper Sells who will evaluate this patient and make recommendations prior to dispo.  [WF]    Clinical Course User Index [WF] Tedd Sias, [place]   MDM Rules/Calculators/A&P                          Patient is a 65 year old female with a past medical history detailed in HPI she is presented to the emergency room today with palpitations.  She states she has no other symptoms besides palpitations.  Denies any lightheadedness, dizziness, syncope, near syncope.  No nausea vomiting or chest pain.  Troponin initially 82--> 66.  EKG without ischemia.  CMP unremarkable apart from mild hyperglycemia.  CBC unremarkable apart from mild anemia.  BNP mag within normal limits.  Normal thyroid panel.  Found to be in a flutter with variable conduction.  Patient is currently only taking daily aspirin.  Discussed with cardiology who reviewed EKGs and discussed admission versus discharge with close follow-up with patient.  Patient is electing to be discharged home at this time with Eliquis for anticoagulation and as needed Cardizem for palpitations/tachycardia.  Patient was offered admission for observation with cardiology following.  She declined this.  Patient states that she is completely asymptomatic at this time.  Repeat EKG with normal sinus rhythm.  Patient has cardiology follow-up appointment 12/21 return precautions reviewed and understood by patient.  Patient also received pharmacologic counseling from [facility] prior to discharge.  Understands she will need to stop taking the aspirin/avoid nsaids  Final Clinical Impression(s) / ED Diagnoses Final diagnoses:  Atrial flutter,  unspecified type [facility])    Rx / DC Orders ED Discharge Orders          Ordered    apixaban (ELIQUIS) 5 MG TABS tablet  2 times daily        12/30/20 1449    diltiazem (CARDIZEM) 30 MG tablet  Every 6 hours PRN        12/30/20 1449             Tedd Sias, [place] 12/31/20 8937    Pattricia Boss, MD 01/01/21 0745

## 2020-12-30 NOTE — Telephone Encounter (Signed)
I-Rhythm notifying about patient's rhythm Afib with HR 110 this morning  Patient was seen by Cardiology consult team and has been started on Eliquis. Because of the prior pause- no bb given but diltiazem q6hrs PRN was given.  Pt's HR is only 110- continue to monitor paroxysmal Afib.

## 2020-12-30 NOTE — Discharge Instructions (Addendum)
You have a 01/03/2021 cardiology follow-up appointment already scheduled.  Please return to the emergency room for any new or concerning symptoms such as chest pain difficulty breathing or tachycardia that does not resolve with the medication prescribed  You have been started on Eliquis (a blood thinner) you will need to stop taking the aspirin that you are currently taking and refrain from taking any ibuprofen, Aleve, meloxicam or other NSAIDs.  The medication diltiazem has also been prescribed.  You may take 1 tablet every 6 hours if you are experiencing palpitations/fast heart rate. Your symptoms do not resolve after 1-2 doses please return to the emergency room.  Your cardiologist as already scheduled you have a follow-up appointment.

## 2020-12-30 NOTE — ED Triage Notes (Signed)
C/o heart beating irregular and fast since 8am today.  Denies pain.  Denies nausea, vomiting, and lightheadedness.  Reports SOB with exertion that started yesterday.

## 2021-01-01 LAB — CULTURE, BLOOD (ROUTINE X 2)
Culture: NO GROWTH
Culture: NO GROWTH
Special Requests: ADEQUATE
Special Requests: ADEQUATE

## 2021-01-02 NOTE — Progress Notes (Signed)
[facility] [place]                                     Cardiology Office Note:    Date:  01/03/2021   ID:  Rose Miller, DOB 01/07/1956, MRN [id number]  PCP:  Rose Buffy, NP  [facility] Cardiologist:  Rose Grooms, MD  / Dr. Ali Lowe & Dr. Cyndia Bent (TAVR) [place] Electrophysiologist:  None   Referring MD: Rose Buffy, NP   [facility] s/p TAVR  History of Present Illness:    Rose Miller is a 65 y.o. 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   She is followed by Dr. Tamala Miller for her cardiology care. The patient was noted to develop progression of aortic insufficiency and aortic stenosis.  Echo showed severe aortic valvular disease consisting of moderate to severe aortic stenosis with AVA 0.78cm2, with a mean gradient of 28 mmHg and severe aortic insufficiency with an ejection fraction She was referred for cardiac catheterization which demonstrated mild obstructive coronary artery disease, elevated wedge pressure of 24 mmHg with V waves to 40 mmHg and an LVEDP of 32 mmHg. The peak to peak pressure gradient on pullback was 41mmHg.     She was evaluated by the multidisciplinary valve team and underwent successful TAVR with a 26 mm Edwards Sapien 3 Ultra Resilia THV via the TF approach on 12/26/20. Post operative echo showed EF 70%, normally functioning TAVR with a mean gradient of 17.7 mmHg and no PVL. BP was elevated and she was started on a nitro gtt. She was resumed on home medications Losartan 100mg  daily, Norvasc 10mg  daily, and hydralazine 25mg  BID, which was increased to 50mg  TID. Given marked sinus bradycardia after TAVR  home Toprol XL was discontinued. She was discharged on a baby aspirin.   She then presented to the ER and was found to  be in rapid afib and started on Eliquis and given PRN diltiazem 30 mg PO Q6hr PRN palpitations.   Today the patient presents to clinic for follow up. Here with son. Doing well. No CP or SOB. No LE edema, orthopnea or PND. No dizziness or syncope. No blood in stool or urine. No palpitations. She does have a non painful lump on her right groin.    Past Medical History:  Diagnosis Date   Adhesive capsulitis    Aortic valve disease    Arthritis    Asthma    Back pain    CKD (chronic kidney disease) stage 3, GFR 30-59 ml/min (HCC)    Constipation    COPD (chronic obstructive pulmonary disease) (HCC)    Diabetes mellitus without complication (HCC)    Diuretic-induced hypokalemia    Hypercholesterolemia    Hypertension    IT band syndrome    Morbid obesity (HCC)    Palpitations    Periumbilical pain    Postmenopausal estrogen deficiency    Prediabetes    Ventral hernia     Past Surgical History:  Procedure Laterality Date   CESAREAN SECTION  1989   COLONOSCOPY     INTRAOPERATIVE TRANSTHORACIC ECHOCARDIOGRAM N/A 12/26/2020   Procedure: INTRAOPERATIVE TRANSTHORACIC ECHOCARDIOGRAM;  Surgeon: Rose Osmond, MD;  Location: [facility]  INVASIVE CV LAB;  Service: Open Heart Surgery;  Laterality: N/A;   RIGHT/LEFT HEART CATH AND CORONARY ANGIOGRAPHY N/A 11/24/2020   Procedure: RIGHT/LEFT HEART CATH AND CORONARY ANGIOGRAPHY;  Surgeon: Rose Crome, MD;  Location: [place] CV LAB;  Service: Cardiovascular;  Laterality: N/A;   TONSILLECTOMY AND ADENOIDECTOMY  1960   TRANSCATHETER AORTIC VALVE REPLACEMENT, TRANSFEMORAL N/A 12/26/2020   Procedure: TRANSCATHETER AORTIC VALVE REPLACEMENT, TRANSFEMORAL;  Surgeon: Rose Osmond, MD;  Location: [place] CV LAB;  Service: Open Heart Surgery;  Laterality: N/A;    Current Medications: Current Meds  Medication Sig   amLODipine (NORVASC) 10 MG tablet Take 1 tablet (10 mg total) by mouth daily.   amoxicillin (AMOXIL) 500 MG tablet Take 4 tablets  by mouth 1 hour prior to dental procedures and cleanings   apixaban (ELIQUIS) 5 MG TABS tablet Take 1 tablet (5 mg total) by mouth 2 (two) times daily.   Ascorbic Acid (VITAMIN C) 1000 MG tablet Take 1,000 mg by mouth daily.   atorvastatin (LIPITOR) 10 MG tablet Take 1 tablet (10 mg total) by mouth daily.   Bayer Microlet Lancets lancets Use when testing glucose as recommended.   Blood Pressure Monitoring (SPHYGMOMANOMETER) MISC 1 Units by Does not apply route daily.   Cholecalciferol 50 MCG (2000 UT) TABS Take 2,000 Units by mouth daily.   Coenzyme Q10 (CO Q-10) 100 MG CAPS Take 100 mg by mouth daily.    Cyanocobalamin (B-12) 500 MCG TABS Take 500 mcg by mouth daily.   diltiazem (CARDIZEM) 30 MG tablet Take 1 tablet (30 mg total) by mouth every 6 (six) hours as needed (fast heart rate, heart palpitations).   glucose blood (CONTOUR NEXT TEST) test strip Test up to 4 times per day as recommended   hydrALAZINE (APRESOLINE) 50 MG tablet Take 1 tablet (50 mg total) by mouth 3 (three) times daily.   Insulin Pen Needle (BD PEN NEEDLE NANO 2ND GEN) 32G X 4 MM MISC 1 Package by Does not apply route 2 (two) times daily.   losartan (COZAAR) 100 MG tablet Take 1 tablet (100 mg total) by mouth daily.   Menthol, Topical Analgesic, (BLUE-EMU MAXIMUM STRENGTH) 2.5 % LIQD Apply 1 application topically daily as needed (pain).   neomycin-polymyxin-hydrocortisone (CORTISPORIN) OTIC solution Apply 1-2 drops to toenail if there is soreness once daily after shower (Patient taking differently: 1-2 drops See admin instructions. Apply 1-2 drops to toenail if there is soreness once daily after shower as needed)   Zinc 50 MG CAPS Take 50 mg by mouth.     Allergies:   Patient has no known allergies.   Social History   Socioeconomic History   Marital status: Single    Spouse name: Not on file   Number of children: 1   Years of education: Not on file   Highest education level: Not on file  Occupational History    Occupation: retired  Tobacco Use   Smoking status: Former    Types: Cigarettes   Smokeless tobacco: Never  Scientific laboratory technician Use: Never used  Substance and Sexual Activity   Alcohol use: Yes    Comment: socially   Drug use: No   Sexual activity: Not on file  Other Topics Concern   Not on file  Social History Narrative   Not on file   Social Determinants of Health   Financial Resource Strain: Medium Risk   Difficulty of Paying Living Expenses: Somewhat hard  Food Insecurity: No Food  Insecurity   Worried About Charity fundraiser in the Last Year: Never true   [place] in the Last Year: Never true  Transportation Needs: No Transportation Needs   Lack of Transportation (Medical): No   Lack of Transportation (Non-Medical): No  Physical Activity: Inactive   Days of Exercise per Week: 0 days   Minutes of Exercise per Session: 0 min  Stress: No Stress Concern Present   Feeling of Stress : Not at all  Social Connections: Socially Isolated   Frequency of Communication with Friends and Family: More than three times a week   Frequency of Social Gatherings with Friends and Family: Once a week   Attends Religious Services: Never   Marine scientist or Organizations: No   Attends Music therapist: Never   Marital Status: Never married     Family History: The patient's family history includes Arthritis in her mother; Cancer in her father and mother; Colon cancer in her father; Heart disease in her father; Hypertension in her mother; Obesity in her mother; Thyroid disease in her father.  ROS:   Please see the history of present illness.    All other systems reviewed and are negative.  EKGs/Labs/Other Studies Reviewed:    The following studies were reviewed today:  TAVR OPERATIVE NOTE     Date of Procedure:                12/26/2020   Preoperative Diagnosis:      Severe Aortic Stenosis    Postoperative Diagnosis:    Same    Procedure:         Transcatheter Aortic Valve Replacement - Percutaneous Right Transfemoral Approach             Edwards Sapien 3 Ultra Resilia THV (size 26 mm, model # 9755RSL, serial # [id number])              Co-Surgeons:                        Gaye Pollack, MD and Lenna Sciara, MD     Anesthesiologist:                  Gennie Alma, MD   Echocardiographer:              Bertrum Sol, MD   Pre-operative Echo Findings: Severe aortic stenosis Normal left ventricular systolic function   Post-operative Echo Findings: No paravalvular leak Normal left ventricular systolic function   ______________________   Echo 12/27/20:  IMPRESSIONS   1. Left ventricular ejection fraction, by estimation, is 70 to 75%. The  left ventricle has hyperdynamic function. The left ventricle has no  regional wall motion abnormalities. There is moderate concentric left  ventricular hypertrophy. Left ventricular  diastolic parameters are consistent with Grade II diastolic dysfunction  (pseudonormalization). Elevated left atrial pressure.   2. Right ventricular systolic function is hyperdynamic. The right  ventricular size is normal. Tricuspid regurgitation signal is inadequate  for assessing PA pressure.   3. Left atrial size was mildly dilated.   4. The mitral valve is normal in structure. No evidence of mitral valve  regurgitation.   5. The aortic valve has been repaired/replaced. Aortic valve  regurgitation is not visualized. There is a 26 mm Sapien prosthetic (TAVR)  valve present in the aortic position. Echo findings are consistent with  normal structure and function of the aortic  valve prosthesis. Aortic  valve mean gradient measures 17.7 mmHg. Aortic  valve Vmax measures 2.69 m/s.   6. There is borderline dilatation of the ascending aorta, measuring 37  mm.   7. The inferior vena cava is normal in size with greater than 50%  respiratory variability, suggesting right atrial pressure of 3 mmHg.  EKG:  EKG is ordered  today.  The ekg ordered today demonstrates sinus HR 72  Recent Labs: 12/30/2020: ALT 14; B Natriuretic Peptide 43.6; BUN 7; Creatinine, Ser 1.16; Hemoglobin 11.1; Magnesium 2.0; Platelets 170; Potassium 3.7; Sodium 140; TSH 1.311  Recent Lipid Panel    Component Value Date/Time   CHOL 137 06/06/2020 0951   TRIG 123.0 06/06/2020 0951   HDL 51.60 06/06/2020 0951   CHOLHDL 3 06/06/2020 0951   VLDL 24.6 06/06/2020 0951   LDLCALC 61 06/06/2020 0951     Risk Assessment/Calculations:    CHA2DS2-VASc Score = 5   This indicates a 7.2% annual risk of stroke. The patient's score is based upon: CHF History: 0 HTN History: 1 Diabetes History: 1 Stroke History: 0 Vascular Disease History: 1 Age Score: 1 Gender Score: 1      Physical Exam:    VS:  BP 136/78    Pulse 72    Ht 5\' 10"  (1.778 m)    Wt (!) 338 lb (153.3 kg)    SpO2 96%    BMI 48.50 kg/m     Wt Readings from Last 3 Encounters:  01/03/21 (!) 338 lb (153.3 kg)  12/27/20 (!) 347 lb 0.1 oz (157.4 kg)  12/22/20 (!) 341 lb 8 oz (154.9 kg)     GEN:  Well nourished, well developed in no acute distress, obese HEENT: Normal NECK: No JVD LYMPHATICS: No lymphadenopathy CARDIAC: RRR, soft murmur. No rubs, gallops RESPIRATORY:  Clear to auscultation without rales, wheezing or rhonchi  ABDOMEN: Soft, non-tender, non-distended MUSCULOSKELETAL:  No edema; No deformity  SKIN: Warm and dry.  Groin sites clear without hematoma or ecchymosis. Small lump on right groin NEUROLOGIC:  Alert and oriented x 3 PSYCHIATRIC:  Normal affect   ASSESSMENT:    1. S/P TAVR (transcatheter aortic valve replacement)   2. Essential hypertension   3. Sinus bradycardia   4. Morbid obesity ([place])   5. PAF (paroxysmal atrial fibrillation) (HCC)    PLAN:    In order of problems listed above:  Severe AS/AI s/p TAVR: doing well 1 week out from TAVR. Groin sites are healing well. ECG with sinus and no HAVB. Continue on Eliquis 5mg  BID. SBE prophylaxis  discussed; I have RX'd amoxicillin. I will see her back in 1 month with echo.   HTN: BP much better controlled on hydralazine 50mg  TID, Norvasc 10mg  daily and Losartan 100mg  daily. Will add back Toprol XL with new diagnosis of afib.   Sinus bradycardia: this has resolved. Resume home Toprol XL. Will watch HRs while still on Zio.   Morbid obesity: Body mass index is 49.79 kg/m. Recommend diet and exercise.   PAF: recently diagnosed with PAF. Back in normal rhythm today. Continue on Eliquis. I will resume home Toprol XL.    Medication Adjustments/Labs and Tests Ordered: Current medicines are reviewed at length with the patient today.  Concerns regarding medicines are outlined above.  Orders Placed This Encounter  Procedures   EKG 12-Lead   Meds ordered this encounter  Medications   amoxicillin (AMOXIL) 500 MG tablet    Sig: Take 4 tablets by mouth 1 hour prior to dental procedures and  cleanings    Dispense:  12 tablet    Refill:  6    Patient Instructions  Medication Instructions:  We have sent in a prescription for Amoxicillin 500 mg, take 4 tablets by mouth 1 hour prior to dental procedures and cleanings   *If you need a refill on your cardiac medications before your next appointment, please call your pharmacy*   Lab Work: None ordered   If you have labs (blood work) drawn today and your tests are completely normal, you will receive your results only by: [facility] (if you have MyChart) OR A paper copy in the mail If you have any lab test that is abnormal or we need to change your treatment, we will call you to review the results.   Testing/Procedures: None ordered today   Follow-Up: Follow up as scheduled    Other Instructions None     Signed, Angelena Form, PA-C  01/03/2021 4:09 PM    [facility]

## 2021-01-03 ENCOUNTER — Other Ambulatory Visit: Payer: Self-pay

## 2021-01-03 ENCOUNTER — Encounter: Payer: Self-pay | Admitting: Physician Assistant

## 2021-01-03 ENCOUNTER — Ambulatory Visit: Payer: Medicare Other | Admitting: Physician Assistant

## 2021-01-03 VITALS — BP 136/78 | HR 72 | Ht 70.0 in | Wt 338.0 lb

## 2021-01-03 DIAGNOSIS — Z952 Presence of prosthetic heart valve: Secondary | ICD-10-CM

## 2021-01-03 DIAGNOSIS — R001 Bradycardia, unspecified: Secondary | ICD-10-CM | POA: Diagnosis not present

## 2021-01-03 DIAGNOSIS — I480 Paroxysmal atrial fibrillation: Secondary | ICD-10-CM

## 2021-01-03 DIAGNOSIS — I10 Essential (primary) hypertension: Secondary | ICD-10-CM

## 2021-01-03 MED ORDER — AMOXICILLIN 500 MG PO TABS: ORAL_TABLET | ORAL | 6 refills | Status: DC

## 2021-01-03 NOTE — Patient Instructions (Addendum)
Medication Instructions:  We have sent in a prescription for Amoxicillin 500 mg, take 4 tablets by mouth 1 hour prior to dental procedures and cleanings   *If you need a refill on your cardiac medications before your next appointment, please call your pharmacy*   Lab Work: None ordered   If you have labs (blood work) drawn today and your tests are completely normal, you will receive your results only by: [facility] (if you have MyChart) OR A paper copy in the mail If you have any lab test that is abnormal or we need to change your treatment, we will call you to review the results.   Testing/Procedures: None ordered today   Follow-Up: Follow up as scheduled    Other Instructions None

## 2021-01-04 ENCOUNTER — Telehealth: Payer: Self-pay | Admitting: Physician Assistant

## 2021-01-04 NOTE — Telephone Encounter (Signed)
There is minimal efficacy data for black seed oil for any use. Side effect profile for long term use is unknown. May slow blood clotting so would not recommend since she also takes Eliquis.  Forwarding ASA question to [facility] who just saw pt for post TAVR visit.

## 2021-01-04 NOTE — Telephone Encounter (Signed)
Patient wants to know if she should start taking aspirin again.  She also wants to know if it's okay for her to use black seed oil.

## 2021-01-04 NOTE — Telephone Encounter (Signed)
No aspirin. Just Eliquis

## 2021-01-05 NOTE — Telephone Encounter (Signed)
PT called and advised.

## 2021-01-31 ENCOUNTER — Ambulatory Visit: Payer: Medicare Other | Admitting: Physician Assistant

## 2021-01-31 NOTE — Progress Notes (Addendum)
[facility] [name]                                     Cardiology Office Note:    Date:  02/02/2021   ID:  Rose Miller, DOB 1955/07/12, MRN [id number]  PCP:  Flossie Buffy, NP  [facility] Cardiologist:  Sinclair Grooms, MD  / Dr. Ali Lowe & Dr. Cyndia Bent (TAVR) [place] Electrophysiologist:  None   Referring MD: Flossie Buffy, NP   1 month s/p TAVR  History of Present Illness:    Rose Miller is a 66 y.o. 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   She is followed by Dr. Tamala Miller for her cardiology care. The patient was noted to develop progression of aortic insufficiency and aortic stenosis.  Echo 11/15/20 showed EF 70%, and severe aortic valvular disease consisting of moderate to severe aortic stenosis with AVA 0.78cm2, with a mean gradient of 28 mmHg and severe aortic insufficiency. She was referred for cardiac catheterization which demonstrated mild obstructive coronary artery disease, elevated wedge pressure of 24 mmHg with V waves to 40 mmHg and an LVEDP of 32 mmHg. The peak to peak pressure gradient on pullback was 51mHg.     She was evaluated by the multidisciplinary valve team and underwent successful TAVR with a 26 mm Edwards Sapien 3 Ultra Resilia THV via the TF approach on 12/26/20. Post operative echo showed EF 70%, normally functioning TAVR with a mean gradient of 17.7 mmHg and no PVL. BP was elevated and she was started on a nitro gtt. She was resumed on home medications Losartan 1031mdaily, Norvasc 1045maily, and hydralazine 87m62mD, which was increased to 50mg72m. Given marked sinus bradycardia after TAVR home Toprol XL was discontinued. She was discharged on a baby aspirin.   She then presented to the ER on 12/17 and was  found to be in rapid afib and started on Eliquis and given PRN diltiazem 30 mg PO Q6hr PRN palpitations. At follow up I resumed her Toprol XL.   Today the patient presents to clinic for follow up. Here alone. She is doing great. No CP. Only has SOB with moderate acitivty. No LE edema, orthopnea or PND. No dizziness or syncope. No blood in stool or urine. No palpitations.   Past Medical History:  Diagnosis Date   Adhesive capsulitis    Aortic valve disease    Arthritis    Asthma    Back pain    CKD (chronic kidney disease) stage 3, GFR 30-59 ml/min (HCC)    Constipation    COPD (chronic obstructive pulmonary disease) (HCC)    Diabetes mellitus without complication (HCC)    Diuretic-induced hypokalemia    Hypercholesterolemia    Hypertension    IT band syndrome    Morbid obesity (HCC)    Palpitations    Periumbilical pain    Postmenopausal estrogen deficiency    Prediabetes    Ventral hernia     Past Surgical History:  Procedure Laterality Date   CESAREAN SECTION  1989   COLONOSCOPY     INTRAOPERATIVE TRANSTHORACIC ECHOCARDIOGRAM N/A 12/26/2020   Procedure: INTRAOPERATIVE TRANSTHORACIC ECHOCARDIOGRAM;  Surgeon: Rose Miller  Location: [place] [facility];  Service: Open Heart Surgery;  Laterality: N/A;   RIGHT/LEFT HEART CATH AND CORONARY ANGIOGRAPHY N/A 11/24/2020   Procedure: RIGHT/LEFT HEART CATH AND CORONARY ANGIOGRAPHY;  Surgeon: Rose Crome, MD;  Location: [place] [facility];  Service: Cardiovascular;  Laterality: N/A;   TONSILLECTOMY AND ADENOIDECTOMY  1960   TRANSCATHETER AORTIC VALVE REPLACEMENT, TRANSFEMORAL N/A 12/26/2020   Procedure: TRANSCATHETER AORTIC VALVE REPLACEMENT, TRANSFEMORAL;  Surgeon: Early Osmond, MD;  Location: [place] [facility];  Service: Open Heart Surgery;  Laterality: N/A;    Current Medications: Current Meds  Medication Sig   amLODipine (NORVASC) 10 MG tablet Take 1 tablet (10 mg total) by mouth daily.   Ascorbic Acid (VITAMIN  C) 1000 MG tablet Take 1,000 mg by mouth daily.   atorvastatin (LIPITOR) 10 MG tablet Take 1 tablet (10 mg total) by mouth daily.   Bayer Microlet Lancets lancets Use when testing glucose as recommended.   Blood Pressure Monitoring (SPHYGMOMANOMETER) MISC 1 Units by Does not apply route daily.   Coenzyme Q10 (CO Q-10) 100 MG CAPS Take 100 mg by mouth daily.    Cyanocobalamin (B-12) 500 MCG TABS Take 500 mcg by mouth daily.   diltiazem (CARDIZEM) 30 MG tablet Take 1 tablet (30 mg total) by mouth every 6 (six) hours as needed (fast heart rate, heart palpitations).   glucose blood (CONTOUR NEXT TEST) test strip Test up to 4 times per day as recommended   hydrALAZINE (APRESOLINE) 50 MG tablet Take 1 tablet (50 mg total) by mouth 3 (three) times daily.   Insulin Pen Needle (BD PEN NEEDLE NANO 2ND GEN) 32G X 4 MM MISC 1 Package by Does not apply route 2 (two) times daily.   losartan (COZAAR) 100 MG tablet Take 1 tablet (100 mg total) by mouth daily.   Menthol, Topical Analgesic, (BLUE-EMU MAXIMUM STRENGTH) 2.5 % LIQD Apply 1 application topically daily as needed (pain).   metoprolol succinate (TOPROL-XL) 25 MG 24 hr tablet Take 25 mg by mouth daily.   neomycin-polymyxin-hydrocortisone (CORTISPORIN) OTIC solution Apply 1-2 drops to toenail if there is soreness once daily after shower (Patient taking differently: 1-2 drops See admin instructions. Apply 1-2 drops to toenail if there is soreness once daily after shower as needed)   spironolactone (ALDACTONE) 25 MG tablet Take 0.5 tablets (12.5 mg total) by mouth daily.   Zinc 50 MG CAPS Take 50 mg by mouth.   [DISCONTINUED] amoxicillin (AMOXIL) 500 MG tablet Take 4 tablets by mouth 1 hour prior to dental procedures and cleanings     Allergies:   Patient has no known allergies.   Social History   Socioeconomic History   Marital status: Single    Spouse name: Not on file   Number of children: 1   Years of education: Not on file   Highest education  level: Not on file  Occupational History   Occupation: retired  Tobacco Use   Smoking status: Former    Types: Cigarettes   Smokeless tobacco: Never  Scientific laboratory technician Use: Never used  Substance and Sexual Activity   Alcohol use: Yes    Comment: socially   Drug use: No   Sexual activity: Not on file  Other Topics Concern   Not on file  Social History Narrative   Not on file   Social Determinants of Health   Financial Resource Strain: Medium Risk   Difficulty of Paying Living Expenses: Somewhat hard  Food Insecurity: No  Food Insecurity   Worried About Charity fundraiser in the Last Year: Never true   Ran Out of Food in the Last Year: Never true  Transportation Needs: No Transportation Needs   Lack of Transportation (Medical): No   Lack of Transportation (Non-Medical): No  Physical Activity: Inactive   Days of Exercise per Week: 0 days   Minutes of Exercise per Session: 0 min  Stress: No Stress Concern Present   Feeling of Stress : Not at all  Social Connections: Socially Isolated   Frequency of Communication with Friends and Family: More than three times a week   Frequency of Social Gatherings with Friends and Family: Once a week   Attends Religious Services: Never   Marine scientist or Organizations: No   Attends Music therapist: Never   Marital Status: Never married     Family History: The patient's family history includes Arthritis in her mother; Cancer in her father and mother; Colon cancer in her father; Heart disease in her father; Hypertension in her mother; Obesity in her mother; Thyroid disease in her father.  ROS:   Please see the history of present illness.    All other systems reviewed and are negative.  EKGs/Labs/Other Studies Reviewed:    The following studies were reviewed today:  TAVR OPERATIVE NOTE     Date of Procedure:                12/26/2020   Preoperative Diagnosis:      Severe Aortic Stenosis    Postoperative  Diagnosis:    Same    Procedure:        Transcatheter Aortic Valve Replacement - Percutaneous Right Transfemoral Approach             Edwards Sapien 3 Ultra Resilia THV (size 26 mm, model # 9755RSL, serial # [id number])              Co-Surgeons:                        Gaye Pollack, MD and Lenna Sciara, MD     Anesthesiologist:                  Gennie Alma, MD   Echocardiographer:              Bertrum Sol, MD   Pre-operative Echo Findings: Severe aortic stenosis Normal left ventricular systolic function   Post-operative Echo Findings: No paravalvular leak Normal left ventricular systolic function   ______________________   Echo 12/27/20:  IMPRESSIONS   1. Left ventricular ejection fraction, by estimation, is 70 to 75%. The  left ventricle has hyperdynamic function. The left ventricle has no  regional wall motion abnormalities. There is moderate concentric left  ventricular hypertrophy. Left ventricular  diastolic parameters are consistent with Grade II diastolic dysfunction  (pseudonormalization). Elevated left atrial pressure.   2. Right ventricular systolic function is hyperdynamic. The right  ventricular size is normal. Tricuspid regurgitation signal is inadequate  for assessing PA pressure.   3. Left atrial size was mildly dilated.   4. The mitral valve is normal in structure. No evidence of mitral valve  regurgitation.   5. The aortic valve has been repaired/replaced. Aortic valve  regurgitation is not visualized. There is a 26 mm Sapien prosthetic (TAVR)  valve present in the aortic position. Echo findings are consistent with  normal structure and function of the aortic  valve prosthesis.  Aortic valve mean gradient measures 17.7 mmHg. Aortic  valve Vmax measures 2.69 m/s.   6. There is borderline dilatation of the ascending aorta, measuring 37  mm.   7. The inferior vena cava is normal in size with greater than 50%  respiratory variability, suggesting right atrial  pressure of 3 mmHg.  ________________________   Elwyn Reach 12/27/20-01/10/21 Study Highlights    Paroxysmal atrial fibrillation with RVR. Burden 4% Otherwise NSR with occational PAC's, PVC's, and SVT. PVC and PAC burden < 1%     Patch Wear Time:  14 days and 0 hours (2022-12-14T12:16:42-0500 to 2022-12-28T12:16:42-0500)   Patient had a min HR of 40 bpm, max HR of 195 bpm, and avg HR of 71 bpm. Predominant underlying rhythm was Sinus Rhythm. 1 run of Ventricular Tachycardia occurred lasting 4 beats with a max rate of 169 bpm (avg 164 bpm). 36 Supraventricular Tachycardia  runs occurred, the run with the fastest interval lasting 4 beats with a max rate of 188 bpm, the longest lasting 11.9 secs with an avg rate of 116 bpm. Atrial Fibrillation occurred (4% burden), ranging from 57-195 bpm (avg of 107 bpm), the longest  lasting 7 hours 13 mins with an avg rate of 100 bpm. Isolated SVEs were rare (<1.0%), SVE Couplets were rare (<1.0%), and SVE Triplets were rare (<1.0%). Isolated VEs were rare (<1.0%, 2949), VE Couplets were rare (<1.0%, 91), and VE Triplets were rare  (<1.0%, 1). Ventricular Bigeminy and Trigeminy were present. Previously Notified: MD notification criteria for First Documentation of Atrial Fibrillation met - report posted prior to notification per account request (CL).  _____________________  Echo 02/02/21 IMPRESSIONS   1. Left ventricular ejection fraction, by estimation, is 60 to 65%. The  left ventricle has normal function. The left ventricle has no regional  wall motion abnormalities. There is mild left ventricular hypertrophy.  Left ventricular diastolic parameters  were normal. The average left ventricular global longitudinal strain is  -19.5 %. The global longitudinal strain is normal.   2. Right ventricular systolic function is normal. The right ventricular  size is normal.   3. Left atrial size was mildly dilated.   4. The mitral valve is normal in structure. Trivial  mitral valve  regurgitation. No evidence of mitral stenosis.   5. Post TAVR with 26 mm Sapien valve Trivial PVL mean gradient 11 peak 22  mmHg AVA 2.8 cm2. The aortic valve has been repaired/replaced. Aortic  valve regurgitation is not visualized. No aortic stenosis is present.  There is a 26 mm Ultra, stented (TAVR)  valve present in the aortic position. Procedure Date: 12/26/20.   6. Aortic dilatation noted. There is mild dilatation of the ascending  aorta, measuring 38 mm.   7. The inferior vena cava is normal in size with greater than 50%  respiratory variability, suggesting right atrial pressure of 3 mmHg.   Comparison(s): 12/27/20 EF 70-75%.   EKG:  EKG is NOT ordered today.    Recent Labs: 12/30/2020: ALT 14; B Natriuretic Peptide 43.6; BUN 7; Creatinine, Ser 1.16; Hemoglobin 11.1; Magnesium 2.0; Platelets 170; Potassium 3.7; Sodium 140; TSH 1.311  Recent Lipid Panel    Component Value Date/Time   CHOL 137 06/06/2020 0951   TRIG 123.0 06/06/2020 0951   HDL 51.60 06/06/2020 0951   CHOLHDL 3 06/06/2020 0951   VLDL 24.6 06/06/2020 0951   LDLCALC 61 06/06/2020 0951     Risk Assessment/Calculations:    CHA2DS2-VASc Score = 5   This indicates a 7.2% annual  risk of stroke. The patient's score is based upon: CHF History: 0 HTN History: 1 Diabetes History: 1 Stroke History: 0 Vascular Disease History: 1 Age Score: 1 Gender Score: 1      Physical Exam:    VS:  BP (!) 152/88    Pulse 79    Ht '5\' 10"'  (1.778 m)    Wt (!) 334 lb (151.5 kg)    SpO2 93%    BMI 47.92 kg/m     Wt Readings from Last 3 Encounters:  02/02/21 (!) 334 lb (151.5 kg)  01/03/21 (!) 338 lb (153.3 kg)  12/27/20 (!) 347 lb 0.1 oz (157.4 kg)     GEN:  Well nourished, well developed in no acute distress, obese HEENT: Normal NECK: No JVD LYMPHATICS: No lymphadenopathy CARDIAC: RRR, soft murmur. No rubs, gallops RESPIRATORY:  Clear to auscultation without rales, wheezing or rhonchi  ABDOMEN:  Soft, non-tender, non-distended MUSCULOSKELETAL:  No edema; No deformity  SKIN: Warm and dry.  NEUROLOGIC:  Alert and oriented x 3 PSYCHIATRIC:  Normal affect   ASSESSMENT:    1. S/P TAVR (transcatheter aortic valve replacement)   2. Essential hypertension   3. Morbid obesity ([place])   4. PAF (paroxysmal atrial fibrillation) (HCC)     PLAN:    In order of problems listed above:  Severe AS/AI s/p TAVR: echo today shows EF 60%, normally functioning TAVR with a mean gradient of 11 mm hg and trivial PVL. She has NYHA class II symptoms. Continue on Eliquis 69m BID. SBE prophylaxis discussed; she has amoxicillin. I will see her back in 1 year with echo.   HTN: BP still uncontrolled; 158/88 today and echo shows some degree of LVH. She is currently on hydralazine 565mTID, Norvasc 1064maily, Losartan 100m33mily and Toprol XL 25mg53mly. Will add Sprio 12.5mg d54my and check labs. I will see her back for follow up labs and BP.  Morbid obesity: Body mass index is 49.79 kg/m. Recommend diet and exercise.   PAF: recently diagnosed with PAF. Sounds regular on exam. Recent Zio with 4% burden. Continue Toprol XL and Eliquis 5mg BI27m   Medication Adjustments/Labs and Tests Ordered: Current medicines are reviewed at length with the patient today.  Concerns regarding medicines are outlined above.  Orders Placed This Encounter  Procedures   Basic metabolic panel   Meds ordered this encounter  Medications   amoxicillin (AMOXIL) 500 MG tablet    Sig: Take 4 tablets by mouth 1 hour prior to dental procedures and cleanings    Dispense:  12 tablet    Refill:  6   spironolactone (ALDACTONE) 25 MG tablet    Sig: Take 0.5 tablets (12.5 mg total) by mouth daily.    Dispense:  45 tablet    Refill:  3    Patient Instructions  Medication Instructions:  Start Spironolactone 12.5 mg daily    *If you need a refill on your cardiac medications before your next appointment, please call your  pharmacy*   Lab Work: Bmp- Today   If you have labs (blood work) drawn today and your tests are completely normal, you will receive your results only by: [facility]) OR A paper copy in the mail If you have any lab test that is abnormal or we need to change your treatment, we will call you to review the results.   Testing/Procedures: None ordered    Follow-Up: Follow up as scheduled    Other Instructions  None     Signed, Angelena Form, PA-C  02/02/2021 1:59 PM    [facility]

## 2021-02-01 ENCOUNTER — Encounter: Payer: Self-pay | Admitting: Sports Medicine

## 2021-02-01 ENCOUNTER — Other Ambulatory Visit: Payer: Self-pay

## 2021-02-01 ENCOUNTER — Ambulatory Visit (INDEPENDENT_AMBULATORY_CARE_PROVIDER_SITE_OTHER): Payer: No Typology Code available for payment source | Admitting: Sports Medicine

## 2021-02-01 DIAGNOSIS — M79675 Pain in left toe(s): Secondary | ICD-10-CM

## 2021-02-01 DIAGNOSIS — B351 Tinea unguium: Secondary | ICD-10-CM

## 2021-02-01 DIAGNOSIS — N1831 Chronic kidney disease, stage 3a: Secondary | ICD-10-CM | POA: Diagnosis not present

## 2021-02-01 DIAGNOSIS — M79674 Pain in right toe(s): Secondary | ICD-10-CM

## 2021-02-01 DIAGNOSIS — Z872 Personal history of diseases of the skin and subcutaneous tissue: Secondary | ICD-10-CM

## 2021-02-01 DIAGNOSIS — E119 Type 2 diabetes mellitus without complications: Secondary | ICD-10-CM

## 2021-02-01 NOTE — Patient Instructions (Signed)
Vinegar soaks 1 cup of white distilled vinegar to 8 cups of warm water.  Soak 20 mins. May repeat soak two times per week.  If there is thickness to nails may file nails after soaks or after bath/shower with nail file and apply tea tree oil. Apply oil daily to nails after filing for the best result.  

## 2021-02-01 NOTE — Progress Notes (Signed)
Subjective: Rose Miller is a 66 y.o. female patient with history of diabetes who returns to office today for long painful nails.  Patient unable to trim.  Patient denies any changes with medications or health history since last encounter.  No other pedal complaints noted.  Patient Active Problem List   Diagnosis Date Noted   Aortic valve disease 12/26/2020   COPD (chronic obstructive pulmonary disease) ([facility]) 12/26/2020   S/P TAVR (transcatheter aortic valve replacement) 12/26/2020   Postmenopausal estrogen deficiency 02/18/2019   Former smoker 05/13/2018   Long-term use of aspirin therapy 05/13/2018   Class 3 severe obesity with serious comorbidity and body mass index (BMI) of 45.0 to 49.9 in adult [facility]) 03/31/2017   DM (diabetes mellitus) ([facility]) 03/31/2017   CKD (chronic kidney disease) stage 3, GFR 30-59 ml/min (HCC) 03/31/2017   Hypercholesterolemia 97/28/2060   Periumbilical pain 15/61/5379   IT band syndrome 09/11/2014   Right shoulder pain 09/11/2014   Ventral hernia 05/01/2014   Essential hypertension 03/17/2014   Current Outpatient Medications on File Prior to Visit  Medication Sig Dispense Refill   amLODipine (NORVASC) 10 MG tablet Take 1 tablet (10 mg total) by mouth daily. 90 tablet 3   amoxicillin (AMOXIL) 500 MG tablet Take 4 tablets by mouth 1 hour prior to dental procedures and cleanings 12 tablet 6   apixaban (ELIQUIS) 5 MG TABS tablet Take 1 tablet (5 mg total) by mouth 2 (two) times daily. 60 tablet 0   Ascorbic Acid (VITAMIN C) 1000 MG tablet Take 1,000 mg by mouth daily.     atorvastatin (LIPITOR) 10 MG tablet Take 1 tablet (10 mg total) by mouth daily. 90 tablet 3   Bayer Microlet Lancets lancets Use when testing glucose as recommended.     Blood Pressure Monitoring (SPHYGMOMANOMETER) MISC 1 Units by Does not apply route daily. 1 each 0   Cholecalciferol 50 MCG (2000 UT) TABS Take 2,000 Units by mouth daily.     Coenzyme Q10 (CO Q-10) 100 MG CAPS Take 100 mg by  mouth daily.      Cyanocobalamin (B-12) 500 MCG TABS Take 500 mcg by mouth daily.     diltiazem (CARDIZEM) 30 MG tablet Take 1 tablet (30 mg total) by mouth every 6 (six) hours as needed (fast heart rate, heart palpitations). 14 tablet 0   glucose blood (CONTOUR NEXT TEST) test strip Test up to 4 times per day as recommended     hydrALAZINE (APRESOLINE) 50 MG tablet Take 1 tablet (50 mg total) by mouth 3 (three) times daily. 90 tablet 3   Insulin Pen Needle (BD PEN NEEDLE NANO 2ND GEN) 32G X 4 MM MISC 1 Package by Does not apply route 2 (two) times daily. 100 each 0   losartan (COZAAR) 100 MG tablet Take 1 tablet (100 mg total) by mouth daily. 90 tablet 3   Menthol, Topical Analgesic, (BLUE-EMU MAXIMUM STRENGTH) 2.5 % LIQD Apply 1 application topically daily as needed (pain).     neomycin-polymyxin-hydrocortisone (CORTISPORIN) OTIC solution Apply 1-2 drops to toenail if there is soreness once daily after shower (Patient taking differently: 1-2 drops See admin instructions. Apply 1-2 drops to toenail if there is soreness once daily after shower as needed) 10 mL 0   Zinc 50 MG CAPS Take 50 mg by mouth.     No current facility-administered medications on file prior to visit.   No Known Allergies  Recent Results (from the past 2160 hour(s))  ECHOCARDIOGRAM COMPLETE  Status: None   Collection Time: 11/15/20 10:54 AM  Result Value Ref Range   Area-P 1/2 4.08 cm2   S' Lateral 2.70 cm   AV Area mean vel 1.79 cm2   AR max vel 1.73 cm2   AV Area VTI 1.76 cm2   MV VTI 2.97 cm2   P 1/2 time 603 msec   Ao pk vel 3.59 m/s   AV Mean grad 28.0 mmHg   AV Peak grad 51.7 mmHg  Basic metabolic panel     Status: Abnormal   Collection Time: 11/21/20  3:46 PM  Result Value Ref Range   Glucose 103 (H) 70 - 99 mg/dL   BUN 9 8 - 27 mg/dL   Creatinine, Ser 1.13 (H) 0.57 - 1.00 mg/dL   eGFR 54 (L) >59 mL/min/1.73   BUN/Creatinine Ratio 8 (L) 12 - 28   Sodium 142 134 - 144 mmol/L   Potassium 4.2 3.5 -  5.2 mmol/L   Chloride 104 96 - 106 mmol/L   CO2 26 20 - 29 mmol/L   Calcium 9.8 8.7 - 10.3 mg/dL  CBC     Status: Abnormal   Collection Time: 11/21/20  3:46 PM  Result Value Ref Range   WBC 4.8 3.4 - 10.8 x10E3/uL   RBC 4.13 3.77 - 5.28 x10E6/uL   Hemoglobin 11.1 11.1 - 15.9 g/dL   Hematocrit 35.5 34.0 - 46.6 %   MCV 86 79 - 97 fL   MCH 26.9 26.6 - 33.0 pg   MCHC 31.3 (L) 31.5 - 35.7 g/dL   RDW 15.2 11.7 - 15.4 %   Platelets 192 150 - 450 x10E3/uL  Glucose, capillary     Status: Abnormal   Collection Time: 11/24/20 10:08 AM  Result Value Ref Range   Glucose-Capillary 121 (H) 70 - 99 mg/dL    Comment: Glucose reference range applies only to samples taken after fasting for at least 8 hours.  POCT I-Stat EG7     Status: Abnormal   Collection Time: 11/24/20 12:35 PM  Result Value Ref Range   pH, Ven 7.349 7.250 - 7.430   pCO2, Ven 48.7 44.0 - 60.0 mmHg   pO2, Ven 36.0 32.0 - 45.0 mmHg   Bicarbonate 26.8 20.0 - 28.0 mmol/L   TCO2 28 22 - 32 mmol/L   O2 Saturation 65.0 %   Acid-Base Excess 1.0 0.0 - 2.0 mmol/L   Sodium 142 135 - 145 mmol/L   Potassium 3.6 3.5 - 5.1 mmol/L   Calcium, Ion 1.25 1.15 - 1.40 mmol/L   HCT 32.0 (L) 36.0 - 46.0 %   Hemoglobin 10.9 (L) 12.0 - 15.0 g/dL   Sample type MIXED VENOUS SAMPLE    Comment NOTIFIED PHYSICIAN   I-STAT 7, (LYTES, BLD GAS, ICA, H+H)     Status: Abnormal   Collection Time: 11/24/20 12:37 PM  Result Value Ref Range   pH, Arterial 7.362 7.350 - 7.450   pCO2 arterial 45.7 32.0 - 48.0 mmHg   pO2, Arterial 73 (L) 83.0 - 108.0 mmHg   Bicarbonate 25.9 20.0 - 28.0 mmol/L   TCO2 27 22 - 32 mmol/L   O2 Saturation 94.0 %   Acid-Base Excess 0.0 0.0 - 2.0 mmol/L   Sodium 142 135 - 145 mmol/L   Potassium 3.6 3.5 - 5.1 mmol/L   Calcium, Ion 1.24 1.15 - 1.40 mmol/L   HCT 31.0 (L) 36.0 - 46.0 %   Hemoglobin 10.5 (L) 12.0 - 15.0 g/dL   Sample type ARTERIAL  Glucose, capillary     Status: None   Collection Time: 11/24/20  1:18 PM  Result  Value Ref Range   Glucose-Capillary 84 70 - 99 mg/dL    Comment: Glucose reference range applies only to samples taken after fasting for at least 8 hours.  POCT glycosylated hemoglobin (Hb A1C)     Status: Abnormal   Collection Time: 12/12/20 10:42 AM  Result Value Ref Range   Hemoglobin A1C 6.1 (A) 4.0 - 5.6 %  Glucose, capillary     Status: Abnormal   Collection Time: 12/22/20  9:11 AM  Result Value Ref Range   Glucose-Capillary 121 (H) 70 - 99 mg/dL    Comment: Glucose reference range applies only to samples taken after fasting for at least 8 hours.  Urinalysis, Routine w reflex microscopic     Status: None   Collection Time: 12/22/20  9:30 AM  Result Value Ref Range   Color, Urine YELLOW YELLOW   APPearance CLEAR CLEAR   Specific Gravity, Urine 1.025 1.005 - 1.030   pH 5.5 5.0 - 8.0   Glucose, UA NEGATIVE NEGATIVE mg/dL   Hgb urine dipstick NEGATIVE NEGATIVE   Bilirubin Urine NEGATIVE NEGATIVE   Ketones, ur NEGATIVE NEGATIVE mg/dL   Protein, ur NEGATIVE NEGATIVE mg/dL   Nitrite NEGATIVE NEGATIVE   Leukocytes,Ua NEGATIVE NEGATIVE    Comment: Microscopic not done on urines with negative protein, blood, leukocytes, nitrite, or glucose < 500 mg/dL. Performed at [facility], [place] [address] [place], [place] [address]   SARS CORONAVIRUS 2 (TAT 6-24 HRS) Nasopharyngeal Nasopharyngeal Swab     Status: None   Collection Time: 12/22/20  9:30 AM   Specimen: Nasopharyngeal Swab  Result Value Ref Range   SARS Coronavirus 2 NEGATIVE NEGATIVE    Comment: (NOTE) SARS-CoV-2 target nucleic acids are NOT DETECTED.  The SARS-CoV-2 RNA is generally detectable in upper and lower respiratory specimens during the acute phase of infection. Negative results do not preclude SARS-CoV-2 infection, do not rule out co-infections with other pathogens, and should not be used as the sole basis for treatment or other patient management decisions. Negative results must be combined with clinical  observations, patient history, and epidemiological information. The expected result is Negative.  Fact Sheet for Patients: SugarRoll.be  Fact Sheet for Healthcare Providers: https://www.woods-mathews.com/  This test is not yet approved or cleared by the [place] FDA and  has been authorized for detection and/or diagnosis of SARS-CoV-2 by FDA under an Emergency Use Authorization (EUA). This EUA will remain  in effect (meaning this test can be used) for the duration of the COVID-19 declaration under Se ction 564(b)(1) of the Act, 21 U.S.C. section 360bbb-3(b)(1), unless the authorization is terminated or revoked sooner.  Performed at [facility], [place] [address], [place], [place] [address]   CBC     Status: Abnormal   Collection Time: 12/22/20  9:31 AM  Result Value Ref Range   WBC 4.4 4.0 - 10.5 K/uL   RBC 4.23 3.87 - 5.11 MIL/uL   Hemoglobin 11.4 (L) 12.0 - 15.0 g/dL   HCT 36.6 36.0 - 46.0 %   MCV 86.5 80.0 - 100.0 fL   MCH 27.0 26.0 - 34.0 pg   MCHC 31.1 30.0 - 36.0 g/dL   RDW 15.7 (H) 11.5 - 15.5 %   Platelets 216 150 - 400 K/uL   nRBC 0.0 0.0 - 0.2 %    Comment: Performed at [facility], [place]  [address] [place], [place] [address]  Comprehensive metabolic panel     Status: Abnormal   Collection Time: 12/22/20  9:31 AM  Result Value Ref Range   Sodium 139 135 - 145 mmol/L   Potassium 3.9 3.5 - 5.1 mmol/L   Chloride 108 98 - 111 mmol/L   CO2 21 (L) 22 - 32 mmol/L   Glucose, Bld 128 (H) 70 - 99 mg/dL    Comment: Glucose reference range applies only to samples taken after fasting for at least 8 hours.   BUN 10 8 - 23 mg/dL   Creatinine, Ser 1.05 (H) 0.44 - 1.00 mg/dL   Calcium 9.1 8.9 - 10.3 mg/dL   Total Protein 7.3 6.5 - 8.1 g/dL   Albumin 3.6 3.5 - 5.0 g/dL   AST 24 15 - 41 U/L   ALT 25 0 - 44 U/L   Alkaline Phosphatase 64 38 - 126 U/L   Total Bilirubin 0.5 0.3 - 1.2 mg/dL   GFR, Estimated 59 (L) >60  mL/min    Comment: (NOTE) Calculated using the CKD-EPI Creatinine Equation (2021)    Anion gap 10 5 - 15    Comment: Performed at [facility] [address], [place], [place] [address]  Protime-INR     Status: None   Collection Time: 12/22/20  9:31 AM  Result Value Ref Range   Prothrombin Time 13.3 11.4 - 15.2 seconds   INR 1.0 0.8 - 1.2    Comment: (NOTE) INR goal varies based on device and disease states. Performed at [facility], [place] [address], [place], [place] [address]   Brain natriuretic peptide     Status: Abnormal   Collection Time: 12/22/20  9:31 AM  Result Value Ref Range   B Natriuretic Peptide 116.3 (H) 0.0 - 100.0 pg/mL    Comment: Performed at [facility] [address] [place], [place] [address]  Surgical pcr screen     Status: None   Collection Time: 12/22/20  9:31 AM   Specimen: Nasal Mucosa; Nasal Swab  Result Value Ref Range   MRSA, PCR NEGATIVE NEGATIVE   Staphylococcus aureus NEGATIVE NEGATIVE    Comment: (NOTE) The Xpert SA Assay (FDA approved for NASAL specimens in patients 43 years of age and older), is one component of a comprehensive surveillance program. It is not intended to diagnose infection nor to guide or monitor treatment. Performed at [facility], [place] [address] [place], [place] [address]   Blood gas, arterial on room air     Status: Abnormal   Collection Time: 12/22/20  9:59 AM  Result Value Ref Range   FIO2 21.00    pH, Arterial 7.395 7.350 - 7.450   pCO2 arterial 40.4 32.0 - 48.0 mmHg   pO2, Arterial 129 (H) 83.0 - 108.0 mmHg   Bicarbonate 24.2 20.0 - 28.0 mmol/L   Acid-base deficit 0.1 0.0 - 2.0 mmol/L   O2 Saturation 98.8 %   Patient temperature 37.0    Collection site LEFT BRACHIAL    Drawn by COLLECTED BY RT     Comment: [id number]   Sample type ARTERIAL DRAW    Allens test (pass/fail) BRACHIAL ARTERY (A) PASS    Comment: Performed at [facility], [place] [address], [place], [place] [address]   Type and screen     Status: None   Collection Time: 12/22/20  9:59 AM  Result Value Ref Range   ABO/RH(D) O POS  Antibody Screen NEG    Sample Expiration 01/05/2021,2359    Extend sample reason      NO TRANSFUSIONS OR PREGNANCY IN THE PAST 3 MONTHS Performed at [facility], [address] [place], [place] [address]   Glucose, capillary     Status: Abnormal   Collection Time: 12/26/20  7:08 AM  Result Value Ref Range   Glucose-Capillary 119 (H) 70 - 99 mg/dL    Comment: Glucose reference range applies only to samples taken after fasting for at least 8 hours.  ABO/Rh     Status: None   Collection Time: 12/26/20  7:35 AM  Result Value Ref Range   ABO/RH(D)      O POS Performed at [facility] [address] [place], [place] [address]   I-STAT 7, (LYTES, BLD GAS, ICA, H+H)     Status: Abnormal   Collection Time: 12/26/20  9:52 AM  Result Value Ref Range   pH, Arterial 7.294 (L) 7.350 - 7.450   pCO2 arterial 53.8 (H) 32.0 - 48.0 mmHg   pO2, Arterial 123 (H) 83.0 - 108.0 mmHg   Bicarbonate 26.1 20.0 - 28.0 mmol/L   TCO2 28 22 - 32 mmol/L   O2 Saturation 98.0 %   Acid-base deficit 1.0 0.0 - 2.0 mmol/L   Sodium 144 135 - 145 mmol/L   Potassium 3.9 3.5 - 5.1 mmol/L   Calcium, Ion 1.29 1.15 - 1.40 mmol/L   HCT 31.0 (L) 36.0 - 46.0 %   Hemoglobin 10.5 (L) 12.0 - 15.0 g/dL   Sample type ARTERIAL   POCT Activated clotting time     Status: None   Collection Time: 12/26/20  9:57 AM  Result Value Ref Range   Activated Clotting Time 131 seconds    Comment: Reference range 74-137 seconds for patients not on anticoagulant therapy.  I-STAT, chem 8     Status: Abnormal   Collection Time: 12/26/20 10:00 AM  Result Value Ref Range   Sodium 144 135 - 145 mmol/L   Potassium 3.9 3.5 - 5.1 mmol/L   Chloride 108 98 - 111 mmol/L   BUN 15 8 - 23 mg/dL   Creatinine, Ser 1.10 (H) 0.44 - 1.00 mg/dL   Glucose, Bld 122 (H) 70 - 99 mg/dL    Comment: Glucose reference range applies  only to samples taken after fasting for at least 8 hours.   Calcium, Ion 1.23 1.15 - 1.40 mmol/L   TCO2 26 22 - 32 mmol/L   Hemoglobin 10.5 (L) 12.0 - 15.0 g/dL   HCT 31.0 (L) 36.0 - 46.0 %  POCT Activated clotting time     Status: None   Collection Time: 12/26/20 10:50 AM  Result Value Ref Range   Activated Clotting Time 371 seconds    Comment: Reference range 74-137 seconds for patients not on anticoagulant therapy.  I-STAT, chem 8     Status: Abnormal   Collection Time: 12/26/20 10:51 AM  Result Value Ref Range   Sodium 142 135 - 145 mmol/L   Potassium 4.1 3.5 - 5.1 mmol/L   Chloride 106 98 - 111 mmol/L   BUN 17 8 - 23 mg/dL   Creatinine, Ser 1.00 0.44 - 1.00 mg/dL   Glucose, Bld 161 (H) 70 - 99 mg/dL    Comment: Glucose reference range applies only to samples taken after fasting for at least 8 hours.   Calcium, Ion 1.22 1.15 - 1.40 mmol/L   TCO2 27 22 - 32  mmol/L   Hemoglobin 10.5 (L) 12.0 - 15.0 g/dL   HCT 31.0 (L) 36.0 - 46.0 %  ECHOCARDIOGRAM LIMITED     Status: None   Collection Time: 12/26/20 11:23 AM  Result Value Ref Range   AR max vel 2.52 cm2   AV Area VTI 2.58 cm2   AV Mean grad 7.0 mmHg   AV Peak grad 11.4 mmHg   Ao pk vel 1.69 m/s   AV Area mean vel 2.62 cm2   P 1/2 time 271 msec  POCT Activated clotting time     Status: None   Collection Time: 12/26/20 11:29 AM  Result Value Ref Range   Activated Clotting Time 131 seconds    Comment: Reference range 74-137 seconds for patients not on anticoagulant therapy.  I-STAT 7, (LYTES, BLD GAS, ICA, H+H)     Status: Abnormal   Collection Time: 12/26/20 11:29 AM  Result Value Ref Range   pH, Arterial 7.218 (L) 7.350 - 7.450   pCO2 arterial 64.0 (H) 32.0 - 48.0 mmHg   pO2, Arterial 115 (H) 83.0 - 108.0 mmHg   Bicarbonate 26.1 20.0 - 28.0 mmol/L   TCO2 28 22 - 32 mmol/L   O2 Saturation 97.0 %   Acid-base deficit 2.0 0.0 - 2.0 mmol/L   Sodium 142 135 - 145 mmol/L   Potassium 4.2 3.5 - 5.1 mmol/L   Calcium, Ion  1.28 1.15 - 1.40 mmol/L   HCT 30.0 (L) 36.0 - 46.0 %   Hemoglobin 10.2 (L) 12.0 - 15.0 g/dL   Sample type ARTERIAL   I-STAT, chem 8     Status: Abnormal   Collection Time: 12/26/20 12:25 PM  Result Value Ref Range   Sodium 143 135 - 145 mmol/L   Potassium 4.0 3.5 - 5.1 mmol/L   Chloride 107 98 - 111 mmol/L   BUN 15 8 - 23 mg/dL   Creatinine, Ser 1.00 0.44 - 1.00 mg/dL   Glucose, Bld 145 (H) 70 - 99 mg/dL    Comment: Glucose reference range applies only to samples taken after fasting for at least 8 hours.   Calcium, Ion 1.25 1.15 - 1.40 mmol/L   TCO2 25 22 - 32 mmol/L   Hemoglobin 10.2 (L) 12.0 - 15.0 g/dL   HCT 30.0 (L) 36.0 - 46.0 %  Glucose, capillary     Status: Abnormal   Collection Time: 12/26/20  3:26 PM  Result Value Ref Range   Glucose-Capillary 102 (H) 70 - 99 mg/dL    Comment: Glucose reference range applies only to samples taken after fasting for at least 8 hours.  MRSA Next Gen by PCR, Nasal     Status: None   Collection Time: 12/26/20  4:42 PM   Specimen: Nasal Mucosa; Nasal Swab  Result Value Ref Range   MRSA by PCR Next Gen NOT DETECTED NOT DETECTED    Comment: (NOTE) The GeneXpert MRSA Assay (FDA approved for NASAL specimens only), is one component of a comprehensive MRSA colonization surveillance program. It is not intended to diagnose MRSA infection nor to guide or monitor treatment for MRSA infections. Test performance is not FDA approved in patients less than 51 years old. Performed at [facility], [place] [address], [place], [place] [address]   Glucose, capillary     Status: Abnormal   Collection Time: 12/26/20  8:30 PM  Result Value Ref Range   Glucose-Capillary 109 (H) 70 - 99 mg/dL    Comment: Glucose reference range applies only  to samples taken after fasting for at least 8 hours.  Glucose, capillary     Status: None   Collection Time: 12/26/20 11:30 PM  Result Value Ref Range   Glucose-Capillary 90 70 - 99 mg/dL    Comment: Glucose reference  range applies only to samples taken after fasting for at least 8 hours.  CBC     Status: Abnormal   Collection Time: 12/27/20  1:23 AM  Result Value Ref Range   WBC 6.6 4.0 - 10.5 K/uL   RBC 3.88 3.87 - 5.11 MIL/uL   Hemoglobin 10.6 (L) 12.0 - 15.0 g/dL   HCT 33.2 (L) 36.0 - 46.0 %   MCV 85.6 80.0 - 100.0 fL   MCH 27.3 26.0 - 34.0 pg   MCHC 31.9 30.0 - 36.0 g/dL   RDW 16.2 (H) 11.5 - 15.5 %   Platelets 161 150 - 400 K/uL   nRBC 0.0 0.0 - 0.2 %    Comment: Performed at [facility] [address] [place], [place] [address]  Basic metabolic panel     Status: None   Collection Time: 12/27/20  1:23 AM  Result Value Ref Range   Sodium 135 135 - 145 mmol/L    Comment: DELTA CHECK NOTED   Potassium 4.3 3.5 - 5.1 mmol/L   Chloride 104 98 - 111 mmol/L   CO2 22 22 - 32 mmol/L   Glucose, Bld 89 70 - 99 mg/dL    Comment: Glucose reference range applies only to samples taken after fasting for at least 8 hours.   BUN 14 8 - 23 mg/dL   Creatinine, Ser 1.00 0.44 - 1.00 mg/dL   Calcium 9.0 8.9 - 10.3 mg/dL   GFR, Estimated >60 >60 mL/min    Comment: (NOTE) Calculated using the CKD-EPI Creatinine Equation (2021)    Anion gap 9 5 - 15    Comment: Performed at [facility] [address], [place], [place] [address]  Glucose, capillary     Status: Abnormal   Collection Time: 12/27/20  4:01 AM  Result Value Ref Range   Glucose-Capillary 102 (H) 70 - 99 mg/dL    Comment: Glucose reference range applies only to samples taken after fasting for at least 8 hours.  Glucose, capillary     Status: Abnormal   Collection Time: 12/27/20  6:48 AM  Result Value Ref Range   Glucose-Capillary 139 (H) 70 - 99 mg/dL    Comment: Glucose reference range applies only to samples taken after fasting for at least 8 hours.  ECHOCARDIOGRAM COMPLETE     Status: None   Collection Time: 12/27/20  8:00 AM  Result Value Ref Range   Weight 5,552.06 oz   Height 70 in   BP 132/57 mmHg   S' Lateral 2.80 cm    AR max vel 2.29 cm2   AV Area VTI 2.07 cm2   AV Mean grad 17.7 mmHg   AV Peak grad 28.9 mmHg   Ao pk vel 2.69 m/s   Area-P 1/2 2.56 cm2   AV Area mean vel 2.43 cm2  Glucose, capillary     Status: Abnormal   Collection Time: 12/27/20 10:43 AM  Result Value Ref Range   Glucose-Capillary 136 (H) 70 - 99 mg/dL    Comment: Glucose reference range applies only to samples taken after fasting for at least 8 hours.  Culture, blood (routine x 2)     Status: None   Collection Time: 12/27/20 11:57  AM   Specimen: BLOOD  Result Value Ref Range   Specimen Description BLOOD LEFT ANTECUBITAL    Special Requests      BOTTLES DRAWN AEROBIC AND ANAEROBIC Blood Culture adequate volume   Culture      NO GROWTH 5 DAYS Performed at [facility], [place] [address] [place], [place] [address]    Report Status 01/01/2021 FINAL   Culture, blood (routine x 2)     Status: None   Collection Time: 12/27/20 12:03 PM   Specimen: BLOOD  Result Value Ref Range   Specimen Description BLOOD RIGHT ANTECUBITAL    Special Requests      BOTTLES DRAWN AEROBIC AND ANAEROBIC Blood Culture adequate volume   Culture      NO GROWTH 5 DAYS Performed at [facility], [place] [address], [place], [place] [address]    Report Status 01/01/2021 FINAL   Glucose, capillary     Status: Abnormal   Collection Time: 12/27/20 12:52 PM  Result Value Ref Range   Glucose-Capillary 138 (H) 70 - 99 mg/dL    Comment: Glucose reference range applies only to samples taken after fasting for at least 8 hours.  CBC     Status: Abnormal   Collection Time: 12/30/20 10:03 AM  Result Value Ref Range   WBC 6.8 4.0 - 10.5 K/uL   RBC 4.18 3.87 - 5.11 MIL/uL   Hemoglobin 11.1 (L) 12.0 - 15.0 g/dL   HCT 36.0 36.0 - 46.0 %   MCV 86.1 80.0 - 100.0 fL   MCH 26.6 26.0 - 34.0 pg   MCHC 30.8 30.0 - 36.0 g/dL   RDW 16.4 (H) 11.5 - 15.5 %   Platelets 170 150 - 400 K/uL   nRBC 0.0 0.0 - 0.2 %    Comment: Performed at [facility],  [place] [address], [place], [place] [address]  Troponin I (High Sensitivity)     Status: Abnormal   Collection Time: 12/30/20 10:03 AM  Result Value Ref Range   Troponin I (High Sensitivity) 82 (H) <18 ng/L    Comment: (NOTE) Elevated high sensitivity troponin I (hsTnI) values and significant  changes across serial measurements may suggest ACS but many other  chronic and acute conditions are known to elevate hsTnI results.  Refer to the "Links" section for chest pain algorithms and additional  guidance. Performed at [facility], [place] [address], [place], [place] [address]   Comprehensive metabolic panel     Status: Abnormal   Collection Time: 12/30/20 10:03 AM  Result Value Ref Range   Sodium 140 135 - 145 mmol/L   Potassium 3.7 3.5 - 5.1 mmol/L   Chloride 106 98 - 111 mmol/L   CO2 25 22 - 32 mmol/L   Glucose, Bld 165 (H) 70 - 99 mg/dL    Comment: Glucose reference range applies only to samples taken after fasting for at least 8 hours.   BUN 7 (L) 8 - 23 mg/dL   Creatinine, Ser 1.16 (H) 0.44 - 1.00 mg/dL   Calcium 9.0 8.9 - 10.3 mg/dL   Total Protein 7.4 6.5 - 8.1 g/dL   Albumin 3.4 (L) 3.5 - 5.0 g/dL   AST 18 15 - 41 U/L   ALT 14 0 - 44 U/L   Alkaline Phosphatase 60 38 - 126 U/L   Total Bilirubin 0.7 0.3 - 1.2 mg/dL   GFR, Estimated 52 (L) >60 mL/min    Comment: (NOTE) Calculated using the  CKD-EPI Creatinine Equation (2021)    Anion gap 9 5 - 15    Comment: Performed at [facility], [place] [address], [place], [place] [address]  Brain natriuretic peptide     Status: None   Collection Time: 12/30/20 10:03 AM  Result Value Ref Range   B Natriuretic Peptide 43.6 0.0 - 100.0 pg/mL    Comment: Performed at [facility] [address], [place], [place] [address]  Troponin I (High Sensitivity)     Status: Abnormal   Collection Time: 12/30/20  1:12 PM  Result Value Ref Range   Troponin I (High Sensitivity) 66 (H) <18 ng/L    Comment: (NOTE) Elevated high sensitivity  troponin I (hsTnI) values and significant  changes across serial measurements may suggest ACS but many other  chronic and acute conditions are known to elevate hsTnI results.  Refer to the "Links" section for chest pain algorithms and additional  guidance. Performed at [facility], [place] [address], [place], [place] [address]   Magnesium     Status: None   Collection Time: 12/30/20  1:12 PM  Result Value Ref Range   Magnesium 2.0 1.7 - 2.4 mg/dL    Comment: Performed at [facility] [address], [place], [place] [address]  TSH     Status: None   Collection Time: 12/30/20  1:12 PM  Result Value Ref Range   TSH 1.311 0.350 - 4.500 uIU/mL    Comment: Performed by a 3rd Generation assay with a functional sensitivity of <=0.01 uIU/mL. Performed at [facility], [place] [address] [place], [place] [address]   T4, free     Status: None   Collection Time: 12/30/20  1:12 PM  Result Value Ref Range   Free T4 0.91 0.61 - 1.12 ng/dL    Comment: (NOTE) Biotin ingestion may interfere with free T4 tests. If the results are inconsistent with the TSH level, previous test results, or the clinical presentation, then consider biotin interference. If needed, order repeat testing after stopping biotin. Performed at [facility], [place] [address], [place], [place] [address]     Objective: General: Patient is awake, alert, and oriented x 3 and in no acute distress.  Integument: Skin is warm, dry and supple bilateral. Nails are tender, long, thickened and dystrophic with subungual debris, consistent with onychomycosis, 1-5 bilateral.  Chronic incurvation of bilateral hallux nails with no acute signs of infection.  No open lesions or preulcerative lesions present bilateral. Remaining integument unremarkable.  Vasculature:  Dorsalis Pedis pulse 1/4 bilateral. Posterior Tibial pulse 1/4 bilateral. Capillary fill time <3 sec 1-5 bilateral. Positive hair growth to the level of the  digits.Temperature gradient within normal limits. No varicosities present bilateral. No edema present bilateral.   Neurology: Gross sensation present via light touch on today's exam.  Musculoskeletal: Asymptomatic hammertoe and pes planus pedal deformities noted bilateral. Muscular strength 5/5 in all lower extremity muscular groups bilateral without pain on range of motion, unchanged from prior.  No tenderness with calf compression bilateral.  Assessment and Plan: Problem List Items Addressed This Visit   None Visit Diagnoses     Pain due to onychomycosis of toenails of both feet    -  Primary   History of ingrowing nail       Diabetes mellitus without complication ([place])           -Examined patient. -Re-Discussed and educated patient on diabetic foot care, especially  with  regards to the vascular, neurological and musculoskeletal systems.  -Mechanically debrided all nails 1-5 bilateral using sterile nail nipper and filed with dremel without incident.  Advised soaking with vinegar to help with subungual debris and any soreness that ingrown's -Recommend daily skin emollients for dry skin plantar surfaces of both feet -Answered all patient questions -Patient to return  in 2.5 months for diabetic nail care or sooner if problems or issues arise.  Landis Martins, DPM

## 2021-02-02 ENCOUNTER — Encounter: Payer: Self-pay | Admitting: Physician Assistant

## 2021-02-02 ENCOUNTER — Ambulatory Visit: Payer: No Typology Code available for payment source | Admitting: Physician Assistant

## 2021-02-02 ENCOUNTER — Ambulatory Visit (HOSPITAL_COMMUNITY): Payer: No Typology Code available for payment source | Attending: Cardiovascular Disease

## 2021-02-02 VITALS — BP 152/88 | HR 79 | Ht 70.0 in | Wt 334.0 lb

## 2021-02-02 DIAGNOSIS — Z952 Presence of prosthetic heart valve: Secondary | ICD-10-CM

## 2021-02-02 DIAGNOSIS — I10 Essential (primary) hypertension: Secondary | ICD-10-CM

## 2021-02-02 DIAGNOSIS — I480 Paroxysmal atrial fibrillation: Secondary | ICD-10-CM

## 2021-02-02 LAB — BASIC METABOLIC PANEL
BUN/Creatinine Ratio: 12 (ref 12–28)
BUN: 13 mg/dL (ref 8–27)
CO2: 24 mmol/L (ref 20–29)
Calcium: 9.3 mg/dL (ref 8.7–10.3)
Chloride: 103 mmol/L (ref 96–106)
Creatinine, Ser: 1.09 mg/dL — ABNORMAL HIGH (ref 0.57–1.00)
Glucose: 105 mg/dL — ABNORMAL HIGH (ref 70–99)
Potassium: 3.8 mmol/L (ref 3.5–5.2)
Sodium: 140 mmol/L (ref 134–144)
eGFR: 56 mL/min/{1.73_m2} — ABNORMAL LOW (ref >59)

## 2021-02-02 LAB — ECHOCARDIOGRAM COMPLETE
AR max vel: 2.81 cm2
AV Area VTI: 2.74 cm2
AV Area mean vel: 2.84 cm2
AV Mean grad: 11 mmHg
AV Peak grad: 22 mmHg
Ao pk vel: 2.35 m/s
Area-P 1/2: 2.93 cm2
S' Lateral: 3.2 cm

## 2021-02-02 MED ORDER — SPIRONOLACTONE 25 MG PO TABS: 12.5000 mg | ORAL_TABLET | Freq: Every day | ORAL | 3 refills | Status: DC

## 2021-02-02 MED ORDER — AMOXICILLIN 500 MG PO TABS: ORAL_TABLET | ORAL | 6 refills | Status: DC

## 2021-02-02 NOTE — Patient Instructions (Signed)
Medication Instructions:  Start Spironolactone 12.5 mg daily    *If you need a refill on your cardiac medications before your next appointment, please call your pharmacy*   Lab Work: Bmp- Today   If you have labs (blood work) drawn today and your tests are completely normal, you will receive your results only by: [place] (if you have MyChart) OR A paper copy in the mail If you have any lab test that is abnormal or we need to change your treatment, we will call you to review the results.   Testing/Procedures: None ordered    Follow-Up: Follow up as scheduled    Other Instructions None

## 2021-02-05 DIAGNOSIS — I350 Nonrheumatic aortic (valve) stenosis: Secondary | ICD-10-CM | POA: Diagnosis not present

## 2021-02-05 DIAGNOSIS — I129 Hypertensive chronic kidney disease with stage 1 through stage 4 chronic kidney disease, or unspecified chronic kidney disease: Secondary | ICD-10-CM | POA: Diagnosis not present

## 2021-02-05 DIAGNOSIS — E1122 Type 2 diabetes mellitus with diabetic chronic kidney disease: Secondary | ICD-10-CM | POA: Diagnosis not present

## 2021-02-05 DIAGNOSIS — N1831 Chronic kidney disease, stage 3a: Secondary | ICD-10-CM | POA: Diagnosis not present

## 2021-02-15 NOTE — Progress Notes (Signed)
[facility] [place]                                     Cardiology Office Note:    Date:  02/16/2021   ID:  Rose Miller, DOB Jul 02, 1955, MRN [id number]  PCP:  Flossie Buffy, NP  [facility] Cardiologist:  Sinclair Grooms, MD  / Dr. Ali Lowe & Dr. Cyndia Bent (TAVR) [place] Electrophysiologist:  None   Referring MD: Flossie Buffy, NP   Follow up BP  History of Present Illness:    Rose Miller is a 66 y.o. 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   She is followed by Dr. [name] for her cardiology care. The patient was noted to develop progression of aortic insufficiency and aortic stenosis.  Echo 11/15/20 showed EF 70%, and severe aortic valvular disease consisting of moderate to severe aortic stenosis with AVA 0.78cm2, with a mean gradient of 28 mmHg and severe aortic insufficiency. She was referred for cardiac catheterization which demonstrated mild obstructive coronary artery disease, elevated wedge pressure of 24 mmHg with V waves to 40 mmHg and an LVEDP of 32 mmHg. The peak to peak pressure gradient on pullback was 71mHg.     She was evaluated by the multidisciplinary valve team and underwent successful TAVR with a 26 mm Edwards Sapien 3 Ultra Resilia THV via the TF approach on 12/26/20. Post operative echo showed EF 70%, normally functioning TAVR with a mean gradient of 17.7 mmHg and no PVL. BP was elevated and she was started on a nitro gtt. She was resumed on home medications Losartan 1074mdaily, Norvasc 1070maily, and hydralazine 86m65mD, which was increased to 50mg43m. Given marked sinus bradycardia after TAVR home Toprol XL was discontinued. She was discharged on a baby aspirin.   She then presented to the ER on 12/17 and was found to  be in rapid afib and started on Eliquis and given PRN diltiazem 30 mg PO Q6hr PRN palpitations. At follow up I resumed her Toprol XL. 1 month echo showed EF 60%, normally functioning TAVR with a mean gradient of 11 mm hg and trivial PVL. Her BP was elevated and Spiro 12.5mg d16my was added.   Today the patient presents to clinic for follow up. She is doing well. No CP or SOB. No LE edema, orthopnea or PND. No dizziness or syncope. No blood in stool or urine. No palpitations. Reviewed BPs and averages are in an acceptable range ~130 s~623lic.     Past Medical History:  Diagnosis Date   Adhesive capsulitis    Aortic valve disease    Arthritis    Asthma    Back pain    CKD (chronic kidney disease) stage 3, GFR 30-59 ml/min (HCC)    Constipation    COPD (chronic obstructive pulmonary disease) (HCC)    Diabetes mellitus without complication (HCC)    Diuretic-induced hypokalemia    Hypercholesterolemia    Hypertension    IT band syndrome    Morbid obesity (HCC)    Palpitations    Periumbilical pain    Postmenopausal estrogen deficiency    Prediabetes    Ventral hernia     Past  Surgical History:  Procedure Laterality Date   CESAREAN SECTION  1989   COLONOSCOPY     INTRAOPERATIVE TRANSTHORACIC ECHOCARDIOGRAM N/A 12/26/2020   Procedure: INTRAOPERATIVE TRANSTHORACIC ECHOCARDIOGRAM;  Surgeon: Early Osmond, MD;  Location: [facility];  Service: Open Heart Surgery;  Laterality: N/A;   RIGHT/LEFT HEART CATH AND CORONARY ANGIOGRAPHY N/A 11/24/2020   Procedure: RIGHT/LEFT HEART CATH AND CORONARY ANGIOGRAPHY;  Surgeon: Belva Crome, MD;  Location: [facility];  Service: Cardiovascular;  Laterality: N/A;   TONSILLECTOMY AND ADENOIDECTOMY  1960   TRANSCATHETER AORTIC VALVE REPLACEMENT, TRANSFEMORAL N/A 12/26/2020   Procedure: TRANSCATHETER AORTIC VALVE REPLACEMENT, TRANSFEMORAL;  Surgeon: Early Osmond, MD;  Location: [facility];  Service: Open Heart Surgery;   Laterality: N/A;    Current Medications: Current Meds  Medication Sig   amLODipine (NORVASC) 10 MG tablet Take 1 tablet (10 mg total) by mouth daily.   amoxicillin (AMOXIL) 500 MG tablet Take 4 tablets by mouth 1 hour prior to dental procedures and cleanings   apixaban (ELIQUIS) 5 MG TABS tablet Take 1 tablet (5 mg total) by mouth 2 (two) times daily.   Ascorbic Acid (VITAMIN C) 1000 MG tablet Take 1,000 mg by mouth daily.   atorvastatin (LIPITOR) 10 MG tablet Take 1 tablet (10 mg total) by mouth daily.   Bayer Microlet Lancets lancets Use when testing glucose as recommended.   Blood Pressure Monitoring (SPHYGMOMANOMETER) MISC 1 Units by Does not apply route daily.   Cholecalciferol 50 MCG (2000 UT) TABS Take 2,000 Units by mouth daily.   Coenzyme Q10 (CO Q-10) 100 MG CAPS Take 100 mg by mouth daily.    Cyanocobalamin (B-12) 500 MCG TABS Take 500 mcg by mouth daily.   diltiazem (CARDIZEM) 30 MG tablet Take 1 tablet (30 mg total) by mouth every 6 (six) hours as needed (fast heart rate, heart palpitations).   glucose blood (CONTOUR NEXT TEST) test strip Test up to 4 times per day as recommended   hydrALAZINE (APRESOLINE) 50 MG tablet Take 1 tablet (50 mg total) by mouth 3 (three) times daily.   Insulin Pen Needle (BD PEN NEEDLE NANO 2ND GEN) 32G X 4 MM MISC 1 Package by Does not apply route 2 (two) times daily.   losartan (COZAAR) 100 MG tablet Take 1 tablet (100 mg total) by mouth daily.   Menthol, Topical Analgesic, (BLUE-EMU MAXIMUM STRENGTH) 2.5 % LIQD Apply 1 application topically daily as needed (pain).   metoprolol succinate (TOPROL-XL) 25 MG 24 hr tablet Take 25 mg by mouth daily.   neomycin-polymyxin-hydrocortisone (CORTISPORIN) OTIC solution Apply 1-2 drops to toenail if there is soreness once daily after shower   spironolactone (ALDACTONE) 25 MG tablet Take 0.5 tablets (12.5 mg total) by mouth daily.   Zinc 50 MG CAPS Take 50 mg by mouth.     Allergies:   Patient has no known  allergies.   Social History   Socioeconomic History   Marital status: Single    Spouse name: Not on file   Number of children: 1   Years of education: Not on file   Highest education level: Not on file  Occupational History   Occupation: retired  Tobacco Use   Smoking status: Former    Types: Cigarettes   Smokeless tobacco: Never  Vaping Use   Vaping Use: Never used  Substance and Sexual Activity   Alcohol use: Yes    Comment: socially   Drug use: No   Sexual activity:  Not on file  Other Topics Concern   Not on file  Social History Narrative   Not on file   Social Determinants of Health   Financial Resource Strain: Medium Risk   Difficulty of Paying Living Expenses: Somewhat hard  Food Insecurity: No Food Insecurity   Worried About Running Out of Food in the Last Year: Never true   Ran Out of Food in the Last Year: Never true  Transportation Needs: No Transportation Needs   Lack of Transportation (Medical): No   Lack of Transportation (Non-Medical): No  Physical Activity: Inactive   Days of Exercise per Week: 0 days   Minutes of Exercise per Session: 0 min  Stress: No Stress Concern Present   Feeling of Stress : Not at all  Social Connections: Socially Isolated   Frequency of Communication with Friends and Family: More than three times a week   Frequency of Social Gatherings with Friends and Family: Once a week   Attends Religious Services: Never   Marine scientist or Organizations: No   Attends Music therapist: Never   Marital Status: Never married     Family History: The patient's family history includes Arthritis in her mother; Cancer in her father and mother; Colon cancer in her father; Heart disease in her father; Hypertension in her mother; Obesity in her mother; Thyroid disease in her father.  ROS:   Please see the history of present illness.    All other systems reviewed and are negative.  EKGs/Labs/Other Studies Reviewed:     The following studies were reviewed today:  TAVR OPERATIVE NOTE     Date of Procedure:                12/26/2020   Preoperative Diagnosis:      Severe Aortic Stenosis    Postoperative Diagnosis:    Same    Procedure:        Transcatheter Aortic Valve Replacement - Percutaneous Right Transfemoral Approach             Edwards Sapien 3 Ultra Resilia THV (size 26 mm, model # 9755RSL, serial # [id number])              Co-Surgeons:                        Gaye Pollack, MD and Lenna Sciara, MD     Anesthesiologist:                  Gennie Alma, MD   Echocardiographer:              Bertrum Sol, MD   Pre-operative Echo Findings: Severe aortic stenosis Normal left ventricular systolic function   Post-operative Echo Findings: No paravalvular leak Normal left ventricular systolic function   ______________________   Echo 12/27/20:  IMPRESSIONS   1. Left ventricular ejection fraction, by estimation, is 70 to 75%. The  left ventricle has hyperdynamic function. The left ventricle has no  regional wall motion abnormalities. There is moderate concentric left  ventricular hypertrophy. Left ventricular  diastolic parameters are consistent with Grade II diastolic dysfunction  (pseudonormalization). Elevated left atrial pressure.   2. Right ventricular systolic function is hyperdynamic. The right  ventricular size is normal. Tricuspid regurgitation signal is inadequate  for assessing PA pressure.   3. Left atrial size was mildly dilated.   4. The mitral valve is normal in structure. No evidence of mitral valve  regurgitation.  5. The aortic valve has been repaired/replaced. Aortic valve  regurgitation is not visualized. There is a 26 mm Sapien prosthetic (TAVR)  valve present in the aortic position. Echo findings are consistent with  normal structure and function of the aortic  valve prosthesis. Aortic valve mean gradient measures 17.7 mmHg. Aortic  valve Vmax measures 2.69 m/s.   6.  There is borderline dilatation of the ascending aorta, measuring 37  mm.   7. The inferior vena cava is normal in size with greater than 50%  respiratory variability, suggesting right atrial pressure of 3 mmHg.  ________________________   Elwyn Reach 12/27/20-01/10/21 Study Highlights    Paroxysmal atrial fibrillation with RVR. Burden 4% Otherwise NSR with occational PAC's, PVC's, and SVT. PVC and PAC burden < 1%     Patch Wear Time:  14 days and 0 hours (2022-12-14T12:16:42-0500 to 2022-12-28T12:16:42-0500)   Patient had a min HR of 40 bpm, max HR of 195 bpm, and avg HR of 71 bpm. Predominant underlying rhythm was Sinus Rhythm. 1 run of Ventricular Tachycardia occurred lasting 4 beats with a max rate of 169 bpm (avg 164 bpm). 36 Supraventricular Tachycardia  runs occurred, the run with the fastest interval lasting 4 beats with a max rate of 188 bpm, the longest lasting 11.9 secs with an avg rate of 116 bpm. Atrial Fibrillation occurred (4% burden), ranging from 57-195 bpm (avg of 107 bpm), the longest  lasting 7 hours 13 mins with an avg rate of 100 bpm. Isolated SVEs were rare (<1.0%), SVE Couplets were rare (<1.0%), and SVE Triplets were rare (<1.0%). Isolated VEs were rare (<1.0%, 2949), VE Couplets were rare (<1.0%, 91), and VE Triplets were rare  (<1.0%, 1). Ventricular Bigeminy and Trigeminy were present. Previously Notified: MD notification criteria for First Documentation of Atrial Fibrillation met - report posted prior to notification per account request (CL).  _____________________  Echo 02/02/21 IMPRESSIONS   1. Left ventricular ejection fraction, by estimation, is 60 to 65%. The  left ventricle has normal function. The left ventricle has no regional  wall motion abnormalities. There is mild left ventricular hypertrophy.  Left ventricular diastolic parameters  were normal. The average left ventricular global longitudinal strain is  -19.5 %. The global longitudinal strain is  normal.   2. Right ventricular systolic function is normal. The right ventricular  size is normal.   3. Left atrial size was mildly dilated.   4. The mitral valve is normal in structure. Trivial mitral valve  regurgitation. No evidence of mitral stenosis.   5. Post TAVR with 26 mm Sapien valve Trivial PVL mean gradient 11 peak 22  mmHg AVA 2.8 cm2. The aortic valve has been repaired/replaced. Aortic  valve regurgitation is not visualized. No aortic stenosis is present.  There is a 26 mm Ultra, stented (TAVR)  valve present in the aortic position. Procedure Date: 12/26/20.   6. Aortic dilatation noted. There is mild dilatation of the ascending  aorta, measuring 38 mm.   7. The inferior vena cava is normal in size with greater than 50%  respiratory variability, suggesting right atrial pressure of 3 mmHg.   Comparison(s): 12/27/20 EF 70-75%.   EKG:  EKG is NOT ordered today.    Recent Labs: 12/30/2020: ALT 14; B Natriuretic Peptide 43.6; Hemoglobin 11.1; Magnesium 2.0; Platelets 170; TSH 1.311 02/02/2021: BUN 13; Creatinine, Ser 1.09; Potassium 3.8; Sodium 140  Recent Lipid Panel    Component Value Date/Time   CHOL 137 06/06/2020 0951   TRIG 123.0  06/06/2020 0951   HDL 51.60 06/06/2020 0951   CHOLHDL 3 06/06/2020 0951   VLDL 24.6 06/06/2020 0951   LDLCALC 61 06/06/2020 0951     Risk Assessment/Calculations:    CHA2DS2-VASc Score = 5   This indicates a 7.2% annual risk of stroke. The patient's score is based upon: CHF History: 0 HTN History: 1 Diabetes History: 1 Stroke History: 0 Vascular Disease History: 1 Age Score: 1 Gender Score: 1      Physical Exam:    VS:  BP 140/70    Pulse 75    Ht 5\' 10"  (1.778 m)    Wt (!) 334 lb (151.5 kg)    SpO2 97%    BMI 47.92 kg/m     Wt Readings from Last 3 Encounters:  02/16/21 (!) 334 lb (151.5 kg)  02/02/21 (!) 334 lb (151.5 kg)  01/03/21 (!) 338 lb (153.3 kg)     GEN:  Well nourished, well developed in no acute distress,  obese HEENT: Normal NECK: No JVD LYMPHATICS: No lymphadenopathy CARDIAC: RRR, soft murmur. No rubs, gallops RESPIRATORY:  Clear to auscultation without rales, wheezing or rhonchi  ABDOMEN: Soft, non-tender, non-distended MUSCULOSKELETAL:  No edema; No deformity  SKIN: Warm and dry.  NEUROLOGIC:  Alert and oriented x 3 PSYCHIATRIC:  Normal affect   ASSESSMENT:    1. Essential hypertension   2. Morbid obesity ([place])   3. PAF (paroxysmal atrial fibrillation) ([place])   4. S/P TAVR (transcatheter aortic valve replacement)     PLAN:    In order of problems listed above:   HTN: BP 140/70 today and home logs in an acceptable range with an average systolic ~557. Current medications include hydralazine 50mg  TID, Norvasc 10mg  daily, Losartan 100mg  daily, Toprol XL 25mg  daily and Spiro 12.5mg  daily which was added at last visit. Will check a BMET today.   Morbid obesity: Body mass index is 49.79 kg/m. Recommend diet and exercise.   PAF: recently diagnosed with PAF. Sounds regular on exam. Recent Zio with 4% burden. Continue Toprol XL and Eliquis 5mg  BID. Gave patient assistance paper work as she has to pay ~$120 out of pocket and this is expensive for her.   Severe AS/AI s/p TAVR: stable. Will see her back for 1 year follow up and echo.    Medication Adjustments/Labs and Tests Ordered: Current medicines are reviewed at length with the patient today.  Concerns regarding medicines are outlined above.  Orders Placed This Encounter  Procedures   Basic metabolic panel   ECHOCARDIOGRAM COMPLETE   No orders of the defined types were placed in this encounter.   Patient Instructions  Medication Instructions:  Your physician recommends that you continue on your current medications as directed. Please refer to the Current Medication list given to you today.   *If you need a refill on your cardiac medications before your next appointment, please call your pharmacy*   Lab Work: Bmp- Today    If you have labs (blood work) drawn today and your tests are completely normal, you will receive your results only by: [place] (if you have MyChart) OR A paper copy in the mail If you have any lab test that is abnormal or we need to change your treatment, we will call you to review the results.   Testing/Procedures: Your physician has requested that you have an echocardiogram in 1 year . Echocardiography is a painless test that uses sound waves to create images of your heart. It provides your doctor  with information about the size and shape of your heart and how well your hearts chambers and valves are working. This procedure takes approximately one hour. There are no restrictions for this procedure.    Follow-Up: Follow up as scheduled     Other Instructions None     Signed, Angelena Form, PA-C  02/16/2021 2:15 PM     [facility]

## 2021-02-16 ENCOUNTER — Other Ambulatory Visit: Payer: Self-pay

## 2021-02-16 ENCOUNTER — Ambulatory Visit (INDEPENDENT_AMBULATORY_CARE_PROVIDER_SITE_OTHER): Payer: No Typology Code available for payment source | Admitting: Physician Assistant

## 2021-02-16 VITALS — BP 140/70 | HR 75 | Ht 70.0 in | Wt 334.0 lb

## 2021-02-16 DIAGNOSIS — I10 Essential (primary) hypertension: Secondary | ICD-10-CM | POA: Diagnosis not present

## 2021-02-16 DIAGNOSIS — I480 Paroxysmal atrial fibrillation: Secondary | ICD-10-CM

## 2021-02-16 DIAGNOSIS — Z952 Presence of prosthetic heart valve: Secondary | ICD-10-CM | POA: Diagnosis not present

## 2021-02-16 NOTE — Addendum Note (Signed)
Addended by: Mendel Ryder on: 02/16/2021 03:21 PM   Modules accepted: Orders

## 2021-02-16 NOTE — Patient Instructions (Addendum)
Medication Instructions:  Your physician recommends that you continue on your current medications as directed. Please refer to the Current Medication list given to you today.   *If you need a refill on your cardiac medications before your next appointment, please call your pharmacy*   Lab Work: Bmp- Today   If you have labs (blood work) drawn today and your tests are completely normal, you will receive your results only by: [place] (if you have MyChart) OR A paper copy in the mail If you have any lab test that is abnormal or we need to change your treatment, we will call you to review the results.   Testing/Procedures: Your physician has requested that you have an echocardiogram in 1 year . Echocardiography is a painless test that uses sound waves to create images of your heart. It provides your doctor with information about the size and shape of your heart and how well your hearts chambers and valves are working. This procedure takes approximately one hour. There are no restrictions for this procedure.    Follow-Up: Follow up as scheduled     Other Instructions None

## 2021-02-17 LAB — BASIC METABOLIC PANEL
BUN/Creatinine Ratio: 10 — ABNORMAL LOW (ref 12–28)
BUN: 12 mg/dL (ref 8–27)
CO2: 24 mmol/L (ref 20–29)
Calcium: 9.1 mg/dL (ref 8.7–10.3)
Chloride: 107 mmol/L — ABNORMAL HIGH (ref 96–106)
Creatinine, Ser: 1.19 mg/dL — ABNORMAL HIGH (ref 0.57–1.00)
Glucose: 118 mg/dL — ABNORMAL HIGH (ref 70–99)
Potassium: 4.1 mmol/L (ref 3.5–5.2)
Sodium: 143 mmol/L (ref 134–144)
eGFR: 50 mL/min/{1.73_m2} — ABNORMAL LOW (ref >59)

## 2021-02-20 ENCOUNTER — Other Ambulatory Visit: Payer: Self-pay | Admitting: *Deleted

## 2021-02-20 DIAGNOSIS — I10 Essential (primary) hypertension: Secondary | ICD-10-CM

## 2021-02-20 DIAGNOSIS — E785 Hyperlipidemia, unspecified: Secondary | ICD-10-CM

## 2021-02-20 MED ORDER — AMLODIPINE BESYLATE 10 MG PO TABS: 10.0000 mg | ORAL_TABLET | Freq: Every day | ORAL | 3 refills | Status: DC

## 2021-02-20 MED ORDER — METOPROLOL SUCCINATE ER 25 MG PO TB24: 25.0000 mg | ORAL_TABLET | Freq: Every day | ORAL | 3 refills | Status: DC

## 2021-02-20 MED ORDER — APIXABAN 5 MG PO TABS
5.0000 mg | ORAL_TABLET | Freq: Two times a day (BID) | ORAL | 9 refills | Status: DC
Start: 2021-02-20 — End: 2021-02-22

## 2021-02-20 MED ORDER — LOSARTAN POTASSIUM 100 MG PO TABS: 100.0000 mg | ORAL_TABLET | Freq: Every day | ORAL | 3 refills | Status: DC

## 2021-02-20 MED ORDER — ATORVASTATIN CALCIUM 10 MG PO TABS: 10.0000 mg | ORAL_TABLET | Freq: Every day | ORAL | 3 refills | Status: DC

## 2021-02-20 MED ORDER — HYDRALAZINE HCL 50 MG PO TABS: 50.0000 mg | ORAL_TABLET | Freq: Three times a day (TID) | ORAL | 3 refills | Status: DC

## 2021-02-20 MED ORDER — AMOXICILLIN 500 MG PO TABS: ORAL_TABLET | ORAL | 6 refills | Status: DC

## 2021-02-20 MED ORDER — SPIRONOLACTONE 25 MG PO TABS: 12.5000 mg | ORAL_TABLET | Freq: Every day | ORAL | 3 refills | Status: DC

## 2021-02-20 NOTE — Telephone Encounter (Signed)
Pt last saw Nell Range, [place] on 02/16/21, last labs 02/16/21 Creat 1.19, age 66, weight 151.5kg, based on specified criteria pt is on appropriate dosage of Eliquis 5mg  BID for afib.  Will refill rx.

## 2021-02-21 ENCOUNTER — Other Ambulatory Visit: Payer: Self-pay

## 2021-02-21 DIAGNOSIS — I10 Essential (primary) hypertension: Secondary | ICD-10-CM

## 2021-02-21 MED ORDER — HYDRALAZINE HCL 50 MG PO TABS: 50.0000 mg | ORAL_TABLET | Freq: Three times a day (TID) | ORAL | 3 refills | Status: DC

## 2021-02-22 ENCOUNTER — Telehealth: Payer: Self-pay | Admitting: Interventional Cardiology

## 2021-02-22 MED ORDER — APIXABAN 5 MG PO TABS
5.0000 mg | ORAL_TABLET | Freq: Two times a day (BID) | ORAL | 3 refills | Status: DC
Start: 2021-02-22 — End: 2021-04-25

## 2021-02-22 NOTE — Telephone Encounter (Signed)
Prescription refill request for Eliquis received. Indication:Afib  Last office visit:02/16/21 [place])  Scr: 1.19 (02/16/21)  Age: 66 Weight: 151.5kg  Appropriate dose and refill sent to requested pharmacy.

## 2021-02-22 NOTE — Telephone Encounter (Signed)
*  STAT* If patient is at the pharmacy, call can be transferred to refill team.   1. Which medications need to be refilled? (please list name of each medication and dose if known) apixaban (ELIQUIS) 5 MG TABS tablet  2. Which pharmacy/location (including street and city if local pharmacy) is medication to be sent to? [facility] [place], [place] to Registered [facility]  3. Do they need a 30 day or 90 day supply? 90 day   Darnella from [facility] states the patient is requesting 90 day supplies.

## 2021-02-27 ENCOUNTER — Ambulatory Visit: Payer: Medicare Other

## 2021-03-08 ENCOUNTER — Ambulatory Visit: Payer: No Typology Code available for payment source | Admitting: Physician Assistant

## 2021-03-08 ENCOUNTER — Encounter: Payer: Self-pay | Admitting: Physician Assistant

## 2021-03-08 ENCOUNTER — Telehealth: Payer: Self-pay

## 2021-03-08 VITALS — BP 130/82 | HR 54 | Ht 70.0 in | Wt 340.0 lb

## 2021-03-08 DIAGNOSIS — Z800 Family history of malignant neoplasm of digestive organs: Secondary | ICD-10-CM | POA: Diagnosis not present

## 2021-03-08 DIAGNOSIS — Z7901 Long term (current) use of anticoagulants: Secondary | ICD-10-CM

## 2021-03-08 DIAGNOSIS — R1314 Dysphagia, pharyngoesophageal phase: Secondary | ICD-10-CM

## 2021-03-08 DIAGNOSIS — Z952 Presence of prosthetic heart valve: Secondary | ICD-10-CM

## 2021-03-08 MED ORDER — NA SULFATE-K SULFATE-MG SULF 17.5-3.13-1.6 GM/177ML PO SOLN: 1.0000 | ORAL | 0 refills | Status: DC

## 2021-03-08 NOTE — Telephone Encounter (Signed)
Request for surgical clearance:     Endoscopy Procedure  What type of surgery is being performed?     Colon/EGD  When is this surgery scheduled?     05/11/21  What type of clearance is required ?   Pharmacy  Are there any medications that need to be held prior to surgery and how long? Eliquis x2 days   Practice name and name of physician performing surgery?      [facility]-Dr Silvano Rusk   What is your office phone and fax number?      Phone- [phone]  [phone]  Anesthesia type (None, local, MAC, general) ?       MAC

## 2021-03-08 NOTE — Patient Instructions (Signed)
You have been scheduled for an endoscopy and colonoscopy. Please follow the written instructions given to you at your visit today. Please pick up your prep supplies at the pharmacy within the next 1-3 days. If you use inhalers (even only as needed), please bring them with you on the day of your procedure.  We have sent the following medications to your pharmacy for you to pick up at your convenience: Suprep   If you are age 68 or older, your body mass index should be between 23-30. Your Body mass index is 48.78 kg/m. If this is out of the aforementioned range listed, please consider follow up with your Primary Care Provider.   The [facility] providers would like to encourage you to use [facility] to communicate with providers for non-urgent requests or questions.  Due to long hold times on the telephone, sending your provider a message by [facility] may be a faster and more efficient way to get a response.  Please allow 48 business hours for a response.  Please remember that this is for non-urgent requests.  _______________________________________________________  Thank you for choosing me and [facility].  Ellouise Newer PA

## 2021-03-08 NOTE — Progress Notes (Signed)
Chief Complaint: Family history of colon cancer and dysphagia  HPI:    Rose Miller is a 66 year old female with a past medical history as listed below including A-fib on Eliquis, severe aortic stenosis status post TAVR in December, CKD stage III, COPD and obesity, known to Dr. Carlean Purl, who returns to clinic today for follow-up of her family history of colon cancer and a new symptom of dysphagia.    06/07/2014 colonoscopy with severe diverticulosis in the left colon otherwise normal. Repeat recommended in 5 years given father's history of colon cancer.     10/18/2020 patient seen in clinic and at that time wanted to have her colonoscopy given family history of colon cancer in her father.  We discussed her moderate aortic stenosis and her BMI over 50 which would mean she had to have her procedure at the hospital.  She was told to take some time and see if she can get her weight down in order to be able to do this possibly here.    Today, patient explains that she has been able to lose weight with a change in her diet.  BMI today is 48.78.  Also today brings up a new complaint of problems swallowing, typically this is just with water and at random times but happens at least 1 or 2 times a week.  Tells me she will drink a sip of water and it feels like everything just stops up and she has to wait until it opens again in order to move down.  Tells me she drinks room temperature water.  This does not tend to happen with food.  Denies overt heartburn or reflux.    Patient is also status post TAVR in December and doing well per cardiology follow-up recently.  She was diagnosed with A-fib and started on Eliquis around that time as well.    Denies fever, chills, weight loss, change in bowel habits or blood in her stool.  Past Medical History:  Diagnosis Date   Adhesive capsulitis    Aortic valve disease    Arthritis    Asthma    Back pain    CKD (chronic kidney disease) stage 3, GFR 30-59 ml/min (HCC)     Constipation    COPD (chronic obstructive pulmonary disease) (HCC)    Diabetes mellitus without complication (HCC)    Diuretic-induced hypokalemia    Hypercholesterolemia    Hypertension    IT band syndrome    Morbid obesity (HCC)    Palpitations    Periumbilical pain    Postmenopausal estrogen deficiency    Prediabetes    Ventral hernia     Past Surgical History:  Procedure Laterality Date   CESAREAN SECTION  1989   COLONOSCOPY     INTRAOPERATIVE TRANSTHORACIC ECHOCARDIOGRAM N/A 12/26/2020   Procedure: INTRAOPERATIVE TRANSTHORACIC ECHOCARDIOGRAM;  Surgeon: Early Osmond, MD;  Location: [facility];  Service: Open Heart Surgery;  Laterality: N/A;   RIGHT/LEFT HEART CATH AND CORONARY ANGIOGRAPHY N/A 11/24/2020   Procedure: RIGHT/LEFT HEART CATH AND CORONARY ANGIOGRAPHY;  Surgeon: Belva Crome, MD;  Location: [facility];  Service: Cardiovascular;  Laterality: N/A;   TONSILLECTOMY AND ADENOIDECTOMY  1960   TRANSCATHETER AORTIC VALVE REPLACEMENT, TRANSFEMORAL N/A 12/26/2020   Procedure: TRANSCATHETER AORTIC VALVE REPLACEMENT, TRANSFEMORAL;  Surgeon: Early Osmond, MD;  Location: [facility];  Service: Open Heart Surgery;  Laterality: N/A;    Current Outpatient Medications  Medication Sig Dispense Refill   amLODipine (NORVASC)  10 MG tablet Take 1 tablet (10 mg total) by mouth daily. 90 tablet 3   amoxicillin (AMOXIL) 500 MG tablet Take 4 tablets by mouth 1 hour prior to dental procedures and cleanings 12 tablet 6   apixaban (ELIQUIS) 5 MG TABS tablet Take 1 tablet (5 mg total) by mouth 2 (two) times daily. 180 tablet 3   Ascorbic Acid (VITAMIN C) 1000 MG tablet Take 1,000 mg by mouth daily.     atorvastatin (LIPITOR) 10 MG tablet Take 1 tablet (10 mg total) by mouth daily. 90 tablet 3   Bayer Microlet Lancets lancets Use when testing glucose as recommended.     Blood Pressure Monitoring (SPHYGMOMANOMETER) MISC 1 Units by Does not apply route daily. 1 each  0   Cholecalciferol 50 MCG (2000 UT) TABS Take 2,000 Units by mouth daily.     Coenzyme Q10 (CO Q-10) 100 MG CAPS Take 100 mg by mouth daily.      Cyanocobalamin (B-12) 500 MCG TABS Take 500 mcg by mouth daily.     diltiazem (CARDIZEM) 30 MG tablet Take 1 tablet (30 mg total) by mouth every 6 (six) hours as needed (fast heart rate, heart palpitations). 14 tablet 0   glucose blood (CONTOUR NEXT TEST) test strip Test up to 4 times per day as recommended     hydrALAZINE (APRESOLINE) 50 MG tablet Take 1 tablet (50 mg total) by mouth 3 (three) times daily. 270 tablet 3   Insulin Pen Needle (BD PEN NEEDLE NANO 2ND GEN) 32G X 4 MM MISC 1 Package by Does not apply route 2 (two) times daily. 100 each 0   losartan (COZAAR) 100 MG tablet Take 1 tablet (100 mg total) by mouth daily. 90 tablet 3   Menthol, Topical Analgesic, (BLUE-EMU MAXIMUM STRENGTH) 2.5 % LIQD Apply 1 application topically daily as needed (pain).     metoprolol succinate (TOPROL-XL) 25 MG 24 hr tablet Take 1 tablet (25 mg total) by mouth daily. 90 tablet 3   neomycin-polymyxin-hydrocortisone (CORTISPORIN) OTIC solution Apply 1-2 drops to toenail if there is soreness once daily after shower 10 mL 0   spironolactone (ALDACTONE) 25 MG tablet Take 0.5 tablets (12.5 mg total) by mouth daily. 45 tablet 3   Zinc 50 MG CAPS Take 50 mg by mouth.     No current facility-administered medications for this visit.    Allergies as of 03/08/2021   (No Known Allergies)    Family History  Problem Relation Age of Onset   Arthritis Mother    Cancer Mother        pancreatic   Hypertension Mother    Obesity Mother    Cancer Father        prostate and colon   Colon cancer Father    Heart disease Father    Thyroid disease Father     Social History   Socioeconomic History   Marital status: Single    Spouse name: Not on file   Number of children: 1   Years of education: Not on file   Highest education level: Not on file  Occupational History    Occupation: retired  Tobacco Use   Smoking status: Former    Types: Cigarettes   Smokeless tobacco: Never  Scientific laboratory technician Use: Never used  Substance and Sexual Activity   Alcohol use: Yes    Comment: socially   Drug use: No   Sexual activity: Not on file  Other Topics Concern   Not  on file  Social History Narrative   Not on file   Social Determinants of Health   Financial Resource Strain: Not on file  Food Insecurity: Not on file  Transportation Needs: Not on file  Physical Activity: Not on file  Stress: Not on file  Social Connections: Not on file  Intimate Partner Violence: Not on file    Review of Systems:    Constitutional: No weight loss, fever or chills Cardiovascular: No chest pain Respiratory: No SOB Gastrointestinal: See HPI and otherwise negative   Physical Exam:  Vital signs: BP 130/82    Pulse (!) 54    Ht 5\' 10"  (1.778 m)    Wt (!) 340 lb (154.2 kg)    SpO2 98%    BMI 48.78 kg/m    Constitutional:   Pleasant obese African-American female appears to be in NAD, Well developed, Well nourished, alert and cooperative Respiratory: Respirations even and unlabored. Lungs clear to auscultation bilaterally.   No wheezes, crackles, or rhonchi.  Cardiovascular: Normal S1, S2. No MRG. Regular rate and rhythm. No peripheral edema, cyanosis or pallor.  Gastrointestinal:  Soft, nondistended, nontender. No rebound or guarding. Normal bowel sounds. No appreciable masses or hepatomegaly. Rectal:  Not performed.  Psychiatric: Oriented to person, place and time. Demonstrates good judgement and reason without abnormal affect or behaviors.  RELEVANT LABS AND IMAGING: CBC    Component Value Date/Time   WBC 6.8 12/30/2020 1003   RBC 4.18 12/30/2020 1003   HGB 11.1 (L) 12/30/2020 1003   HGB 11.1 11/21/2020 1546   HCT 36.0 12/30/2020 1003   HCT 35.5 11/21/2020 1546   PLT 170 12/30/2020 1003   PLT 192 11/21/2020 1546   MCV 86.1 12/30/2020 1003   MCV 86 11/21/2020  1546   MCH 26.6 12/30/2020 1003   MCHC 30.8 12/30/2020 1003   RDW 16.4 (H) 12/30/2020 1003   RDW 15.2 11/21/2020 1546   LYMPHSABS 1.9 02/02/2020 1138   EOSABS 0.1 02/02/2020 1138   BASOSABS 0.0 02/02/2020 1138    CMP     Component Value Date/Time   NA 143 02/16/2021 1301   K 4.1 02/16/2021 1301   CL 107 (H) 02/16/2021 1301   CO2 24 02/16/2021 1301   GLUCOSE 118 (H) 02/16/2021 1301   GLUCOSE 165 (H) 12/30/2020 1003   BUN 12 02/16/2021 1301   CREATININE 1.19 (H) 02/16/2021 1301   CALCIUM 9.1 02/16/2021 1301   PROT 7.4 12/30/2020 1003   PROT 7.4 02/02/2020 1138   ALBUMIN 3.4 (L) 12/30/2020 1003   ALBUMIN 4.2 02/02/2020 1138   AST 18 12/30/2020 1003   ALT 14 12/30/2020 1003   ALKPHOS 60 12/30/2020 1003   BILITOT 0.7 12/30/2020 1003   BILITOT 0.3 02/02/2020 1138   GFRNONAA 52 (L) 12/30/2020 1003   GFRAA 60 02/02/2020 1138    Assessment: 1.  Family history of colon cancer: In her father, unsure exactly when he was diagnosed but her last colonoscopy was in 2016 with discussion about having repeat in 5 years, she is overdue 2.  Moderate/severe aortic stenosis status post TAVR: Completed in December, most recent echo 02/02/2021 with LVEF 60-65% and post TAVR with no aortic stenosis 3.  New diagnosis of A-fib: On Eliquis 4.  Obesity: BMI now under 50  Plan: 1.  Discussed with patient that since she has had a TAVR and her BMI is under 50 we can complete her procedure here in the [place].  Scheduled her for a diagnostic EGD with possible dilation  and colonoscopy in the [place] with Dr. Carlean Purl.  Did provide the patient a detailed list of risks for the procedures and she agrees to proceed. Patient is appropriate for endoscopic procedure(s) in the ambulatory ([place]) setting.  2.  Recommend the patient hold her Eliquis for 2 days prior to time of procedure.  We will communicate with her prescribing physician to ensure this is acceptable for her. 3. I will also send a note to Osvaldo Angst our lead  anesthesiologist, to make sure he feels like she is appropriate for the [place]. 4.  Discussed patient's dysphagia at times.  Would start with an EGD, she may need esophageal manometry in the future for possible spasm. 5.  Patient follow in clinic per recommendations from Dr. Carlean Purl after time of procedures  Rose Newer, PA-C [facility] 03/08/2021, 9:00 AM  Cc: Flossie Buffy, NP

## 2021-03-09 ENCOUNTER — Telehealth: Payer: Self-pay

## 2021-03-09 ENCOUNTER — Other Ambulatory Visit: Payer: Self-pay

## 2021-03-09 DIAGNOSIS — R1314 Dysphagia, pharyngoesophageal phase: Secondary | ICD-10-CM

## 2021-03-09 DIAGNOSIS — Z952 Presence of prosthetic heart valve: Secondary | ICD-10-CM

## 2021-03-09 DIAGNOSIS — Z7901 Long term (current) use of anticoagulants: Secondary | ICD-10-CM

## 2021-03-09 DIAGNOSIS — Z800 Family history of malignant neoplasm of digestive organs: Secondary | ICD-10-CM

## 2021-03-09 NOTE — Telephone Encounter (Signed)
-----   Message from [place], [place] sent at 03/09/2021 11:22 AM EST ----- Regarding: Needs EGD and colonoscopy at the hospital [facility],  I saw this patient the other day in clinic and I thought since her BMI had dropped and she is status post TAVR she could be done in the [place] but [place] thinks she still should be done at the hospital.  Her procedures will need to be changed which will likely delay them.  Please apologize to her and get her scheduled as quickly as possible.  Thanks, JL L ----- Message ----- From: Osvaldo Angst, CRNA Sent: 03/09/2021  11:19 AM EST To: Levin Erp, PA Subject: RE: [facility] for lec                                 Yes  ----- Message ----- From: Levin Erp, PA Sent: 03/09/2021   9:23 AM EST To: Osvaldo Angst, CRNA Subject: RE: Ok for lec                                 Her BMI is now under 34- and she is s/p TAVR- still needs to be done at the hospital?  [facility] ----- Message ----- From: Osvaldo Angst, CRNA Sent: 03/08/2021   4:53 PM EST To: Levin Erp, PA Subject: RE: [facility] for [facility],  This pt's severe morbid obesity and multiple co-morbidities make her an ASA IV.  Her procedure needs to be done at the hospital.  Thanks,  Osvaldo Angst ----- Message ----- From: Levin Erp, PA Sent: 03/08/2021   9:43 AM EST To: Osvaldo Angst, CRNA Subject: Ok for lec                                     I think she is okay for [place], but can you just look everything over.  Thanks, JL L

## 2021-03-09 NOTE — Telephone Encounter (Signed)
Per Dr Ali Lowe, patient ok to hold Eliquis for 2 days

## 2021-03-09 NOTE — Telephone Encounter (Signed)
Patient with diagnosis of PAF s/p TAVR on Eliquis for anticoagulation.  Patient had TAVR on 12/26/20.  Procedure: Colon/EGD Date of procedure: 05/11/21   CHA2DS2-VASc Score = 5  This indicates a 7.2% annual risk of stroke. The patient's score is based upon: CHF History: 0 HTN History: 1 Diabetes History: 1 Stroke History: 0 Vascular Disease History: 1 Age Score: 1 Gender Score: 1  CrCl 74 ml/min Platelet count 170K  Since patient has A Fib and will be only 4.5 months out from TAVR, will route to cardiologist for input if appropriate to hold anticoagulant or if procedure should be postponed

## 2021-03-09 NOTE — Telephone Encounter (Signed)
Dr. Carlean Purl has no availability at [facility] hospital. Dr. Lyndel Safe has availability on 05/01/21, I have sent him a secure staff message to see if he would be able to perform patient's procedures on this day. Will await response.   Hospital orders in epic. [facility] procedures have been cancelled.   I called and spoke with patient regarding information below. She knows that we have cancelled the [place] procedures and the reasoning behind it. She is aware that we are currently trying to find a date at the hospital. I told her that we are looking at 4/18 as a possibility. She mentioned that the [place] would cost her about $80. I told her that we could send a different prep to see if it is covered any better once we get her scheduled. She knows that we will be in contact with her appt information. Pt verbalized understanding and had no concerns at the end of the call.

## 2021-03-13 ENCOUNTER — Telehealth: Payer: Self-pay | Admitting: Physician Assistant

## 2021-03-13 MED ORDER — PLENVU 140 G PO SOLR: 1.0000 | ORAL | 0 refills | Status: DC

## 2021-03-13 NOTE — Telephone Encounter (Signed)
Patient has been scheduled for an ECL at [facility] on Tuesday, 05/01/21 at 7:30 am. Pt needs to arrive at 6 am with a care partner. Pt is aware that I will send in PLENVU prep to her pharmacy and she will need to pick that up within the next few days or the pharmacy may put it back. Pt knows that I will send her new instructions to my chart and will mail a hard copy. Pt verbalized understanding of all information and had no concerns at the end of the call.  PLENVU prep sent to pharmacy on file, pt confirmed. New instructions sent to patient. Secure staff message sent to [facility] to inform her of procedure location change.

## 2021-03-13 NOTE — Telephone Encounter (Signed)
Jackquline Denmark, MD  Yevette Edwards, RN Absolutely  No problems   RG

## 2021-03-13 NOTE — Telephone Encounter (Addendum)
Returned call to patient. She states that the PLENVU prep would be $95. I told pt that she can come by the 2nd floor receptionist desk to pick up PLENVU sample. Pt verbalized understanding and had no concerns at the end of the call.  PLENVU sample and instructions placed at 2nd floor receptionist desk.

## 2021-03-13 NOTE — Telephone Encounter (Signed)
Patient called regarding the prep that was called into her pharmacy for her.  She said it is too expensive and she needs something more affordable.  Please call patient and advise.  Thank you.

## 2021-03-20 ENCOUNTER — Telehealth: Payer: Self-pay | Admitting: Nurse Practitioner

## 2021-03-20 ENCOUNTER — Other Ambulatory Visit: Payer: Self-pay

## 2021-03-20 ENCOUNTER — Ambulatory Visit (INDEPENDENT_AMBULATORY_CARE_PROVIDER_SITE_OTHER): Payer: No Typology Code available for payment source

## 2021-03-20 VITALS — BP 138/70 | HR 58 | Temp 96.9°F | Ht 70.0 in | Wt 338.0 lb

## 2021-03-20 DIAGNOSIS — Z0000 Encounter for general adult medical examination without abnormal findings: Secondary | ICD-10-CM

## 2021-03-20 NOTE — Patient Instructions (Signed)
Rose Miller , Thank you for taking time to come for your Medicare Wellness Visit. I appreciate your ongoing commitment to your health goals. Please review the following plan we discussed and let me know if I can assist you in the future.   Screening recommendations/referrals: Colonoscopy: scheduled 05/01/2021 Mammogram: 12/04/2020 Bone Density: 05/14/2019 Recommended yearly ophthalmology/optometry visit for glaucoma screening and checkup Recommended yearly dental visit for hygiene and checkup  Vaccinations: Influenza vaccine: completed  Pneumococcal vaccine: will consider  Tdap vaccine: 12/19/2014 Shingles vaccine: will consider     Advanced directives: none   Conditions/risks identified: none   Next appointment: none    Preventive Care 60 Years and Older, Female Preventive care refers to lifestyle choices and visits with your health care provider that can promote health and wellness. What does preventive care include? A yearly physical exam. This is also called an annual well check. Dental exams once or twice a year. Routine eye exams. Ask your health care provider how often you should have your eyes checked. Personal lifestyle choices, including: Daily care of your teeth and gums. Regular physical activity. Eating a healthy diet. Avoiding tobacco and drug use. Limiting alcohol use. Practicing safe sex. Taking low-dose aspirin every day. Taking vitamin and mineral supplements as recommended by your health care provider. What happens during an annual well check? The services and screenings done by your health care provider during your annual well check will depend on your age, overall health, lifestyle risk factors, and family history of disease. Counseling  Your health care provider may ask you questions about your: Alcohol use. Tobacco use. Drug use. Emotional well-being. Home and relationship well-being. Sexual activity. Eating habits. History of falls. Memory and  ability to understand (cognition). Work and work Statistician. Reproductive health. Screening  You may have the following tests or measurements: Height, weight, and BMI. Blood pressure. Lipid and cholesterol levels. These may be checked every 5 years, or more frequently if you are over 80 years old. Skin check. Lung cancer screening. You may have this screening every year starting at age 28 if you have a 30-pack-year history of smoking and currently smoke or have quit within the past 15 years. Fecal occult blood test (FOBT) of the stool. You may have this test every year starting at age 22. Flexible sigmoidoscopy or colonoscopy. You may have a sigmoidoscopy every 5 years or a colonoscopy every 10 years starting at age 95. Hepatitis C blood test. Hepatitis B blood test. Sexually transmitted disease (STD) testing. Diabetes screening. This is done by checking your blood sugar (glucose) after you have not eaten for a while (fasting). You may have this done every 1-3 years. Bone density scan. This is done to screen for osteoporosis. You may have this done starting at age 36. Mammogram. This may be done every 1-2 years. Talk to your health care provider about how often you should have regular mammograms. Talk with your health care provider about your test results, treatment options, and if necessary, the need for more tests. Vaccines  Your health care provider may recommend certain vaccines, such as: Influenza vaccine. This is recommended every year. Tetanus, diphtheria, and acellular pertussis (Tdap, Td) vaccine. You may need a Td booster every 10 years. Zoster vaccine. You may need this after age 59. Pneumococcal 13-valent conjugate (PCV13) vaccine. One dose is recommended after age 65. Pneumococcal polysaccharide (PPSV23) vaccine. One dose is recommended after age 68. Talk to your health care provider about which screenings and vaccines you need and  how often you need them. This information is  not intended to replace advice given to you by your health care provider. Make sure you discuss any questions you have with your health care provider. Document Released: 01/27/2015 Document Revised: 09/20/2015 Document Reviewed: 11/01/2014 Elsevier Interactive Patient Education  2017 [place] Prevention in the Home Falls can cause injuries. They can happen to people of all ages. There are many things you can do to make your home safe and to help prevent falls. What can I do on the outside of my home? Regularly fix the edges of walkways and driveways and fix any cracks. Remove anything that might make you trip as you walk through a door, such as a raised step or threshold. Trim any bushes or trees on the path to your home. Use bright outdoor lighting. Clear any walking paths of anything that might make someone trip, such as rocks or tools. Regularly check to see if handrails are loose or broken. Make sure that both sides of any steps have handrails. Any raised decks and porches should have guardrails on the edges. Have any leaves, snow, or ice cleared regularly. Use sand or salt on walking paths during winter. Clean up any spills in your garage right away. This includes oil or grease spills. What can I do in the bathroom? Use night lights. Install grab bars by the toilet and in the tub and shower. Do not use towel bars as grab bars. Use non-skid mats or decals in the tub or shower. If you need to sit down in the shower, use a plastic, non-slip stool. Keep the floor dry. Clean up any water that spills on the floor as soon as it happens. Remove soap buildup in the tub or shower regularly. Attach bath mats securely with double-sided non-slip rug tape. Do not have throw rugs and other things on the floor that can make you trip. What can I do in the bedroom? Use night lights. Make sure that you have a light by your bed that is easy to reach. Do not use any sheets or blankets that  are too big for your bed. They should not hang down onto the floor. Have a firm chair that has side arms. You can use this for support while you get dressed. Do not have throw rugs and other things on the floor that can make you trip. What can I do in the kitchen? Clean up any spills right away. Avoid walking on wet floors. Keep items that you use a lot in easy-to-reach places. If you need to reach something above you, use a strong step stool that has a grab bar. Keep electrical cords out of the way. Do not use floor polish or wax that makes floors slippery. If you must use wax, use non-skid floor wax. Do not have throw rugs and other things on the floor that can make you trip. What can I do with my stairs? Do not leave any items on the stairs. Make sure that there are handrails on both sides of the stairs and use them. Fix handrails that are broken or loose. Make sure that handrails are as long as the stairways. Check any carpeting to make sure that it is firmly attached to the stairs. Fix any carpet that is loose or worn. Avoid having throw rugs at the top or bottom of the stairs. If you do have throw rugs, attach them to the floor with carpet tape. Make sure that you have  a light switch at the top of the stairs and the bottom of the stairs. If you do not have them, ask someone to add them for you. What else can I do to help prevent falls? Wear shoes that: Do not have high heels. Have rubber bottoms. Are comfortable and fit you well. Are closed at the toe. Do not wear sandals. If you use a stepladder: Make sure that it is fully opened. Do not climb a closed stepladder. Make sure that both sides of the stepladder are locked into place. Ask someone to hold it for you, if possible. Clearly mark and make sure that you can see: Any grab bars or handrails. First and last steps. Where the edge of each step is. Use tools that help you move around (mobility aids) if they are needed. These  include: Canes. Walkers. Scooters. Crutches. Turn on the lights when you go into a dark area. Replace any light bulbs as soon as they burn out. Set up your furniture so you have a clear path. Avoid moving your furniture around. If any of your floors are uneven, fix them. If there are any pets around you, be aware of where they are. Review your medicines with your doctor. Some medicines can make you feel dizzy. This can increase your chance of falling. Ask your doctor what other things that you can do to help prevent falls. This information is not intended to replace advice given to you by your health care provider. Make sure you discuss any questions you have with your health care provider. Document Released: 10/27/2008 Document Revised: 06/08/2015 Document Reviewed: 02/04/2014 Elsevier Interactive Patient Education  2017 [employer].

## 2021-03-20 NOTE — Telephone Encounter (Signed)
This patient would like to transfer care from [facility] to Dr. Ethelene Hal. I will follow through after you respond. ?

## 2021-03-20 NOTE — Progress Notes (Signed)
Subjective:   Rose Miller is a 66 y.o. female who presents for Medicare Annual (Subsequent) preventive examination.  Review of Systems     Cardiac Risk Factors include: advanced age (>64men, >22 women);diabetes mellitus;hypertension     Objective:    Today's Vitals   03/20/21 1256  BP: 138/70  Pulse: (!) 58  Temp: (!) 96.9 F (36.1 C)  SpO2: 95%  Weight: (!) 338 lb (153.3 kg)  Height: $Remove'5\' 10"'WoIpnQO$  (1.778 m)   Body mass index is 48.5 kg/m.  Advanced Directives 03/20/2021 12/26/2020 12/22/2020 11/24/2020 02/22/2020 05/01/2017 06/07/2014  Does Patient Have a Medical Advance Directive? Yes No No No No No No  Type of Paramedic of [place];Living will - - - - - -  Copy of [place] in Chart? No - copy requested - - - - - -  Would patient like information on creating a medical advance directive? - No - Patient declined No - Patient declined No - Patient declined No - Patient declined No - Patient declined -    Current Medications (verified) Outpatient Encounter Medications as of 03/20/2021  Medication Sig   amLODipine (NORVASC) 10 MG tablet Take 1 tablet (10 mg total) by mouth daily.   amoxicillin (AMOXIL) 500 MG tablet Take 4 tablets by mouth 1 hour prior to dental procedures and cleanings   apixaban (ELIQUIS) 5 MG TABS tablet Take 1 tablet (5 mg total) by mouth 2 (two) times daily.   Ascorbic Acid (VITAMIN C) 1000 MG tablet Take 1,000 mg by mouth daily.   atorvastatin (LIPITOR) 10 MG tablet Take 1 tablet (10 mg total) by mouth daily.   Bayer Microlet Lancets lancets Use when testing glucose as recommended.   Blood Pressure Monitoring (SPHYGMOMANOMETER) MISC 1 Units by Does not apply route daily.   Cholecalciferol 50 MCG (2000 UT) TABS Take 2,000 Units by mouth daily.   Coenzyme Q10 (CO Q-10) 100 MG CAPS Take 100 mg by mouth daily.    Cyanocobalamin (B-12) 500 MCG TABS Take 500 mcg by mouth daily.   diltiazem (CARDIZEM) 30 MG tablet Take 1  tablet (30 mg total) by mouth every 6 (six) hours as needed (fast heart rate, heart palpitations).   glucose blood (CONTOUR NEXT TEST) test strip Test up to 4 times per day as recommended   hydrALAZINE (APRESOLINE) 50 MG tablet Take 1 tablet (50 mg total) by mouth 3 (three) times daily.   Insulin Pen Needle (BD PEN NEEDLE NANO 2ND GEN) 32G X 4 MM MISC 1 Package by Does not apply route 2 (two) times daily.   losartan (COZAAR) 100 MG tablet Take 1 tablet (100 mg total) by mouth daily.   Menthol, Topical Analgesic, (BLUE-EMU MAXIMUM STRENGTH) 2.5 % LIQD Apply 1 application topically daily as needed (pain).   metoprolol succinate (TOPROL-XL) 25 MG 24 hr tablet Take 1 tablet (25 mg total) by mouth daily.   neomycin-polymyxin-hydrocortisone (CORTISPORIN) OTIC solution Apply 1-2 drops to toenail if there is soreness once daily after shower   PEG-KCl-NaCl-NaSulf-Na Asc-C (PLENVU) 140 g SOLR Take 1 kit by mouth as directed.   spironolactone (ALDACTONE) 25 MG tablet Take 0.5 tablets (12.5 mg total) by mouth daily.   Zinc 50 MG CAPS Take 50 mg by mouth.   No facility-administered encounter medications on file as of 03/20/2021.    Allergies (verified) Patient has no known allergies.   History: Past Medical History:  Diagnosis Date   Adhesive capsulitis    Aortic valve disease  Arthritis    Asthma    Back pain    CKD (chronic kidney disease) stage 3, GFR 30-59 ml/min (HCC)    Constipation    COPD (chronic obstructive pulmonary disease) (HCC)    Diabetes mellitus without complication (HCC)    Diuretic-induced hypokalemia    Hypercholesterolemia    Hypertension    IT band syndrome    Morbid obesity (HCC)    Palpitations    Periumbilical pain    Postmenopausal estrogen deficiency    Prediabetes    Ventral hernia    Past Surgical History:  Procedure Laterality Date   CESAREAN SECTION  1989   COLONOSCOPY     INTRAOPERATIVE TRANSTHORACIC ECHOCARDIOGRAM N/A 12/26/2020   Procedure:  INTRAOPERATIVE TRANSTHORACIC ECHOCARDIOGRAM;  Surgeon: Early Osmond, MD;  Location: [facility];  Service: Open Heart Surgery;  Laterality: N/A;   RIGHT/LEFT HEART CATH AND CORONARY ANGIOGRAPHY N/A 11/24/2020   Procedure: RIGHT/LEFT HEART CATH AND CORONARY ANGIOGRAPHY;  Surgeon: Belva Crome, MD;  Location: [facility];  Service: Cardiovascular;  Laterality: N/A;   TONSILLECTOMY AND ADENOIDECTOMY  1960   TRANSCATHETER AORTIC VALVE REPLACEMENT, TRANSFEMORAL N/A 12/26/2020   Procedure: TRANSCATHETER AORTIC VALVE REPLACEMENT, TRANSFEMORAL;  Surgeon: Early Osmond, MD;  Location: [facility];  Service: Open Heart Surgery;  Laterality: N/A;   Family History  Problem Relation Age of Onset   Arthritis Mother    Cancer Mother        pancreatic   Hypertension Mother    Obesity Mother    Cancer Father        prostate and colon   Colon cancer Father    Heart disease Father    Thyroid disease Father    Social History   Socioeconomic History   Marital status: Single    Spouse name: Not on file   Number of children: 1   Years of education: Not on file   Highest education level: Not on file  Occupational History   Occupation: retired  Tobacco Use   Smoking status: Former    Types: Cigarettes   Smokeless tobacco: Never  Scientific laboratory technician Use: Never used  Substance and Sexual Activity   Alcohol use: Yes    Comment: socially   Drug use: No   Sexual activity: Not on file  Other Topics Concern   Not on file  Social History Narrative   Not on file   Social Determinants of Health   Financial Resource Strain: Low Risk    Difficulty of Paying Living Expenses: Not hard at all  Food Insecurity: No Food Insecurity   Worried About Charity fundraiser in the Last Year: Never true   Arboriculturist in the Last Year: Never true  Transportation Needs: No Transportation Needs   Lack of Transportation (Medical): No   Lack of Transportation (Non-Medical): No   Physical Activity: Inactive   Days of Exercise per Week: 0 days   Minutes of Exercise per Session: 0 min  Stress: No Stress Concern Present   Feeling of Stress : Not at all  Social Connections: Socially Isolated   Frequency of Communication with Friends and Family: Twice a week   Frequency of Social Gatherings with Friends and Family: Twice a week   Attends Religious Services: Never   Marine scientist or Organizations: No   Attends Archivist Meetings: Never   Marital Status: Never married    Tobacco Counseling Counseling  given: Not Answered   Clinical Intake:  Pre-visit preparation completed: Yes  Pain : No/denies pain     Nutritional Risks: None Diabetes: No  How often do you need to have someone help you when you read instructions, pamphlets, or other written materials from your doctor or pharmacy?: 1 - Never What is the last grade level you completed in school?: college  Diabetic?yes Nutrition Risk Assessment:  Has the patient had any N/V/D within the last 2 months?  No  Does the patient have any non-healing wounds?  No  Has the patient had any unintentional weight loss or weight gain?  No   Diabetes:  Is the patient diabetic?  Yes  If diabetic, was a CBG obtained today?  No  Did the patient bring in their glucometer from home?  No  How often do you monitor your CBG's? Once Month .   Financial Strains and Diabetes Management:  Are you having any financial strains with the device, your supplies or your medication? No .  Does the patient want to be seen by Chronic Care Management for management of their diabetes?  No  Would the patient like to be referred to a Nutritionist or for Diabetic Management?  No   Diabetic Exams:  Diabetic Eye Exam: Completed 09/2020 Diabetic Foot Exam: Overdue, Pt has been advised about the importance in completing this exam. Pt is scheduled for diabetic foot exam on next office visit .   Interpreter Needed?:  No  Information entered by :: [place] of Daily Living In your present state of health, do you have any difficulty performing the following activities: 03/20/2021 12/26/2020  Hearing? N N  Vision? N N  Difficulty concentrating or making decisions? N N  Walking or climbing stairs? N N  Dressing or bathing? N N  Doing errands, shopping? N N  Preparing Food and eating ? N -  Using the Toilet? N -  In the past six months, have you accidently leaked urine? N -  Do you have problems with loss of bowel control? N -  Managing your Medications? N -  Managing your Finances? N -  Housekeeping or managing your Housekeeping? N -  Some recent data might be hidden    Patient Care Team: [name], [name], NP as PCP - General (Internal Medicine) Belva Crome, MD as PCP - Cardiology (Cardiology)  Indicate any recent Medical Services you may have received from other than [facility] providers in the past year (date may be approximate).     Assessment:   This is a routine wellness examination for [place].  Hearing/Vision screen Vision Screening - Comments:: Annual eye exams wear glasses   Dietary issues and exercise activities discussed: Current Exercise Habits: Home exercise routine, Type of exercise: walking, Exercise limited by: None identified   Goals Addressed             This Visit's Progress    Patient Stated   On track    Would like to lose weight & improve health       Depression Screen PHQ 2/9 Scores 03/20/2021 03/20/2021 02/22/2020 02/02/2020 05/17/2019 02/17/2019 05/01/2017  PHQ - 2 Score 0 0 0 1 0 0 0  PHQ- 9 Score - - - 2 - - -    Fall Risk Fall Risk  03/20/2021 12/12/2020 06/06/2020 02/22/2020 11/17/2019  Falls in the past year? 0 1 1 0 0  Number falls in past yr: 0 0 0 0 -  Injury with  Fall? 0 0 1 0 -  Risk for fall due to : No Fall Risks History of fall(s) History of fall(s) - -  Follow up Falls evaluation completed Falls evaluation completed Falls evaluation  completed Falls prevention discussed -    FALL RISK PREVENTION PERTAINING TO THE HOME:  Any stairs in or around the home? Yes  If so, are there any without handrails? No  Home free of loose throw rugs in walkways, pet beds, electrical cords, etc? Yes  Adequate lighting in your home to reduce risk of falls? Yes   ASSISTIVE DEVICES UTILIZED TO PREVENT FALLS:  Life alert? No  Use of a cane, walker or w/c? No  Grab bars in the bathroom? No  Shower chair or bench in shower? Yes  Elevated toilet seat or a handicapped toilet? No   TIMED UP AND GO:  Was the test performed? Yes .  Length of time to ambulate 10 feet: 8 sec.   Gait steady and fast without use of assistive device  Cognitive Function:  Normal cognitive status assessed by direct observation by this Nurse Health Advisor. No abnormalities found.        Immunizations Immunization History  Administered Date(s) Administered   Influenza,inj,Quad PF,6+ Mos 12/19/2014   Tdap 12/19/2014, 12/19/2014    TDAP status: Up to date  Flu Vaccine status: Declined, Education has been provided regarding the importance of this vaccine but patient still declined. Advised may receive this vaccine at local pharmacy or Health Dept. Aware to provide a copy of the vaccination record if obtained from local pharmacy or Health Dept. Verbalized acceptance and understanding.  Pneumococcal vaccine status: Due, Education has been provided regarding the importance of this vaccine. Advised may receive this vaccine at local pharmacy or Health Dept. Aware to provide a copy of the vaccination record if obtained from local pharmacy or Health Dept. Verbalized acceptance and understanding.  Covid-19 vaccine status: Declined, Education has been provided regarding the importance of this vaccine but patient still declined. Advised may receive this vaccine at local pharmacy or Health Dept.or vaccine clinic. Aware to provide a copy of the vaccination record if  obtained from local pharmacy or Health Dept. Verbalized acceptance and understanding.  Qualifies for Shingles Vaccine? Yes   Zostavax completed No   Shingrix Completed?: No.    Education has been provided regarding the importance of this vaccine. Patient has been advised to call insurance company to determine out of pocket expense if they have not yet received this vaccine. Advised may also receive vaccine at local pharmacy or Health Dept. Verbalized acceptance and understanding.  Screening Tests Health Maintenance  Topic Date Due   Zoster Vaccines- Shingrix (1 of 2) Never done   COLONOSCOPY (Pts 45-42yrs Insurance coverage will need to be confirmed)  06/07/2019   INFLUENZA VACCINE  04/13/2021 (Originally 08/14/2020)   Pneumonia Vaccine 49+ Years old (1 - PCV) 12/12/2021 (Originally 12/30/2018)   FOOT EXAM  06/06/2021   HEMOGLOBIN A1C  06/11/2021   OPHTHALMOLOGY EXAM  10/12/2021   MAMMOGRAM  12/04/2021   TETANUS/TDAP  12/18/2024   DEXA SCAN  Completed   Hepatitis C Screening  Completed   HPV VACCINES  Aged Out   COVID-19 Vaccine  Discontinued    Health Maintenance  Health Maintenance Due  Topic Date Due   Zoster Vaccines- Shingrix (1 of 2) Never done   COLONOSCOPY (Pts 45-14yrs Insurance coverage will need to be confirmed)  06/07/2019    Colorectal cancer screening: Referral to GI placed  scheduled 05/01/2021. Pt aware the office will call re: appt.  Mammogram status: Completed 12/04/2020. Repeat every year  Bone Density status: Completed 05/14/2019. Results reflect: Bone density results: OSTEOPENIA. Repeat every 5 years.  Lung Cancer Screening: (Low Dose CT Chest recommended if Age 70-80 years, 30 pack-year currently smoking OR have quit w/in 15years.) does not qualify.   Lung Cancer Screening Referral: n/a  Additional Screening:  Hepatitis C Screening: does not qualify; Completed 04/07/2017  Vision Screening: Recommended annual ophthalmology exams for early detection of  glaucoma and other disorders of the eye. Is the patient up to date with their annual eye exam?  Yes  Who is the provider or what is the name of the office in which the patient attends annual eye exams? [facility]  If pt is not established with a provider, would they like to be referred to a provider to establish care? No .   Dental Screening: Recommended annual dental exams for proper oral hygiene  Community Resource Referral / Chronic Care Management: CRR required this visit?  No   CCM required this visit?  No      Plan:     I have personally reviewed and noted the following in the patients chart:   Medical and social history Use of alcohol, tobacco or illicit drugs  Current medications and supplements including opioid prescriptions.  Functional ability and status Nutritional status Physical activity Advanced directives List of other physicians Hospitalizations, surgeries, and ER visits in previous 12 months Vitals Screenings to include cognitive, depression, and falls Referrals and appointments  In addition, I have reviewed and discussed with patient certain preventive protocols, quality metrics, and best practice recommendations. A written personalized care plan for preventive services as well as general preventive health recommendations were provided to patient.     Randel Pigg, LPN   01/19/9731   Nurse Notes: none

## 2021-03-23 NOTE — Telephone Encounter (Signed)
Pt has been informed okay to hold Eliquis x2 days prior to procedure that is now scheduled for 05/01/21 with Dr Lyndel Safe at [facility]. Pt voiced understanding.   ?

## 2021-04-05 ENCOUNTER — Ambulatory Visit (INDEPENDENT_AMBULATORY_CARE_PROVIDER_SITE_OTHER): Payer: No Typology Code available for payment source | Admitting: Sports Medicine

## 2021-04-05 ENCOUNTER — Encounter: Payer: Self-pay | Admitting: Sports Medicine

## 2021-04-05 ENCOUNTER — Other Ambulatory Visit: Payer: Self-pay

## 2021-04-05 DIAGNOSIS — M79674 Pain in right toe(s): Secondary | ICD-10-CM | POA: Diagnosis not present

## 2021-04-05 DIAGNOSIS — M79675 Pain in left toe(s): Secondary | ICD-10-CM | POA: Diagnosis not present

## 2021-04-05 DIAGNOSIS — M2141 Flat foot [pes planus] (acquired), right foot: Secondary | ICD-10-CM

## 2021-04-05 DIAGNOSIS — B351 Tinea unguium: Secondary | ICD-10-CM | POA: Diagnosis not present

## 2021-04-05 DIAGNOSIS — Z872 Personal history of diseases of the skin and subcutaneous tissue: Secondary | ICD-10-CM

## 2021-04-05 DIAGNOSIS — E119 Type 2 diabetes mellitus without complications: Secondary | ICD-10-CM

## 2021-04-05 DIAGNOSIS — M2142 Flat foot [pes planus] (acquired), left foot: Secondary | ICD-10-CM

## 2021-04-05 DIAGNOSIS — M2042 Other hammer toe(s) (acquired), left foot: Secondary | ICD-10-CM

## 2021-04-05 DIAGNOSIS — M2041 Other hammer toe(s) (acquired), right foot: Secondary | ICD-10-CM

## 2021-04-05 NOTE — Progress Notes (Signed)
Subjective: ?Rose Miller is a 66 y.o. female patient with history of diabetes who returns to office today for long painful nails.  Patient unable to trim.  Reports that her left big toenail is getting a little sore starting to grow in a little.  No other pedal complaints noted. ? ?Patient Active Problem List  ? Diagnosis Date Noted  ? Aortic valve disease 12/26/2020  ? COPD (chronic obstructive pulmonary disease) ([place]) 12/26/2020  ? S/P TAVR (transcatheter aortic valve replacement) 12/26/2020  ? Postmenopausal estrogen deficiency 02/18/2019  ? Former smoker 05/13/2018  ? Long-term use of aspirin therapy 05/13/2018  ? Class 3 severe obesity with serious comorbidity and body mass index (BMI) of 45.0 to 49.9 in adult [facility]) 03/31/2017  ? DM (diabetes mellitus) ([place]) 03/31/2017  ? CKD (chronic kidney disease) stage 3, GFR 30-59 ml/min (HCC) 03/31/2017  ? Hypercholesterolemia 06/28/2016  ? Periumbilical pain 04/04/2246  ? IT band syndrome 09/11/2014  ? Right shoulder pain 09/11/2014  ? Ventral hernia 05/01/2014  ? Essential hypertension 03/17/2014  ? ?Current Outpatient Medications on File Prior to Visit  ?Medication Sig Dispense Refill  ? amLODipine (NORVASC) 10 MG tablet Take 1 tablet (10 mg total) by mouth daily. 90 tablet 3  ? amoxicillin (AMOXIL) 500 MG tablet Take 4 tablets by mouth 1 hour prior to dental procedures and cleanings 12 tablet 6  ? apixaban (ELIQUIS) 5 MG TABS tablet Take 1 tablet (5 mg total) by mouth 2 (two) times daily. 180 tablet 3  ? Ascorbic Acid (VITAMIN C) 1000 MG tablet Take 1,000 mg by mouth daily.    ? atorvastatin (LIPITOR) 10 MG tablet Take 1 tablet (10 mg total) by mouth daily. 90 tablet 3  ? Bayer Microlet Lancets lancets Use when testing glucose as recommended.    ? Blood Pressure Monitoring (SPHYGMOMANOMETER) MISC 1 Units by Does not apply route daily. 1 each 0  ? Cholecalciferol 50 MCG (2000 UT) TABS Take 2,000 Units by mouth daily.    ? Coenzyme Q10 (CO Q-10) 100 MG CAPS Take 100  mg by mouth daily.     ? Cyanocobalamin (B-12) 500 MCG TABS Take 500 mcg by mouth daily.    ? diltiazem (CARDIZEM) 30 MG tablet Take 1 tablet (30 mg total) by mouth every 6 (six) hours as needed (fast heart rate, heart palpitations). 14 tablet 0  ? glucose blood (CONTOUR NEXT TEST) test strip Test up to 4 times per day as recommended    ? hydrALAZINE (APRESOLINE) 50 MG tablet Take 1 tablet (50 mg total) by mouth 3 (three) times daily. 270 tablet 3  ? Insulin Pen Needle (BD PEN NEEDLE NANO 2ND GEN) 32G X 4 MM MISC 1 Package by Does not apply route 2 (two) times daily. 100 each 0  ? losartan (COZAAR) 100 MG tablet Take 1 tablet (100 mg total) by mouth daily. 90 tablet 3  ? Menthol, Topical Analgesic, (BLUE-EMU MAXIMUM STRENGTH) 2.5 % LIQD Apply 1 application topically daily as needed (pain).    ? metoprolol succinate (TOPROL-XL) 25 MG 24 hr tablet Take 1 tablet (25 mg total) by mouth daily. 90 tablet 3  ? neomycin-polymyxin-hydrocortisone (CORTISPORIN) OTIC solution Apply 1-2 drops to toenail if there is soreness once daily after shower 10 mL 0  ? PEG-KCl-NaCl-NaSulf-Na Asc-C (PLENVU) 140 g SOLR Take 1 kit by mouth as directed. 1 each 0  ? spironolactone (ALDACTONE) 25 MG tablet Take 0.5 tablets (12.5 mg total) by mouth daily. 45 tablet 3  ? Zinc  50 MG CAPS Take 50 mg by mouth.    ? ?No current facility-administered medications on file prior to visit.  ? ?No Known Allergies ? ?Recent Results (from the past 2160 hour(s))  ?ECHOCARDIOGRAM COMPLETE     Status: None  ? Collection Time: 02/02/21 10:05 AM  ?Result Value Ref Range  ? Area-P 1/2 2.93 cm2  ? S' Lateral 3.20 cm  ? AV Area mean vel 2.84 cm2  ? AR max vel 2.81 cm2  ? AV Area VTI 2.74 cm2  ? Ao pk vel 2.35 m/s  ? AV Mean grad 11.0 mmHg  ? AV Peak grad 22.0 mmHg  ?Basic metabolic panel     Status: Abnormal  ? Collection Time: 02/02/21 11:23 AM  ?Result Value Ref Range  ? Glucose 105 (H) 70 - 99 mg/dL  ? BUN 13 8 - 27 mg/dL  ? Creatinine, Ser 1.09 (H) 0.57 - 1.00  mg/dL  ? eGFR 56 (L) >59 mL/min/1.73  ? BUN/Creatinine Ratio 12 12 - 28  ? Sodium 140 134 - 144 mmol/L  ? Potassium 3.8 3.5 - 5.2 mmol/L  ? Chloride 103 96 - 106 mmol/L  ? CO2 24 20 - 29 mmol/L  ? Calcium 9.3 8.7 - 10.3 mg/dL  ?Basic metabolic panel     Status: Abnormal  ? Collection Time: 02/16/21  1:01 PM  ?Result Value Ref Range  ? Glucose 118 (H) 70 - 99 mg/dL  ? BUN 12 8 - 27 mg/dL  ? Creatinine, Ser 1.19 (H) 0.57 - 1.00 mg/dL  ? eGFR 50 (L) >59 mL/min/1.73  ? BUN/Creatinine Ratio 10 (L) 12 - 28  ? Sodium 143 134 - 144 mmol/L  ? Potassium 4.1 3.5 - 5.2 mmol/L  ? Chloride 107 (H) 96 - 106 mmol/L  ? CO2 24 20 - 29 mmol/L  ? Calcium 9.1 8.7 - 10.3 mg/dL  ? ? ?Objective: ?General: Patient is awake, alert, and oriented x 3 and in no acute distress. ? ?Integument: Skin is warm, dry and supple bilateral. Nails are tender, long, thickened and dystrophic with subungual debris, consistent with onychomycosis, 1-5 bilateral.  Chronic incurvation of bilateral hallux nails with no acute signs of infection.  No open lesions or preulcerative lesions present bilateral. Remaining integument unremarkable. ? ?Vasculature:  Dorsalis Pedis pulse 1/4 bilateral. Posterior Tibial pulse 1/4 bilateral. Capillary fill time <3 sec 1-5 bilateral. Positive hair growth to the level of the digits.Temperature gradient within normal limits. No varicosities present bilateral. No edema present bilateral.  ? ?Neurology: Gross sensation present via light touch on today's exam. ? ?Musculoskeletal: Asymptomatic hammertoe and pes planus pedal deformities noted bilateral. Muscular strength 5/5 in all lower extremity muscular groups bilateral without pain on range of motion, unchanged from prior.  No tenderness with calf compression bilateral. ? ?Assessment and Plan: ?Problem List Items Addressed This Visit   ?None ?Visit Diagnoses   ? ? Pain due to onychomycosis of toenails of both feet    -  Primary  ? History of ingrowing nail      ? Diabetes mellitus  without complication ([place])      ? Pes planus of both feet      ? Hammer toes of both feet      ? ?  ? ? ?-Examined patient. ?-Re-Discussed and educated patient on diabetic foot care, especially with  ?regards to the vascular, neurological and musculoskeletal systems.  ?-Mechanically debrided all nails 1-5 bilateral using sterile nail nipper and filed with dremel without  incident.  Advised patient to use Corticosporin as needed for any soreness at nails that tend to grow into the skin ?-Recommend daily skin emollients for dry skin plantar surfaces of both feet like previous ?-Patient to see Aaron Edelman for diabetic shoes and insoles ?-Answered all patient questions ?-Patient to return  in 2.5 months for diabetic nail care or sooner if problems or issues arise. ? ?Landis Martins, DPM ? ?

## 2021-04-13 ENCOUNTER — Ambulatory Visit: Payer: No Typology Code available for payment source

## 2021-04-13 DIAGNOSIS — M2141 Flat foot [pes planus] (acquired), right foot: Secondary | ICD-10-CM

## 2021-04-13 DIAGNOSIS — M2041 Other hammer toe(s) (acquired), right foot: Secondary | ICD-10-CM

## 2021-04-13 DIAGNOSIS — M2142 Flat foot [pes planus] (acquired), left foot: Secondary | ICD-10-CM

## 2021-04-13 DIAGNOSIS — E119 Type 2 diabetes mellitus without complications: Secondary | ICD-10-CM

## 2021-04-13 NOTE — Progress Notes (Signed)
SITUATION ?Reason for Consult: Evaluation for Prefabricated Diabetic Shoes and Custom Diabetic Inserts. ?Patient / Caregiver Report: Patient would like well fitting shoes ? ?OBJECTIVE DATA: ?Patient History / Diagnosis:  ?  ICD-10-CM   ?1. Diabetes mellitus without complication (HCC)  C12.7   ?  ?2. Pes planus of both feet  M21.41   ? M21.42   ?  ?3. Hammer toes of both feet  M20.41   ? M20.42   ?  ? ? ?Physician Treating Diabetes:  Dr. Abelino Derrick ? ?Current or Previous Devices:   None and no history ? ?In-Person Foot Examination: ?Ulcers & Callousing:   None ?Deformities:    Pes planus, hammertoes ?Sensation:    Intact  ?Shoe Size:     Men's 12.5XW ? ?ORTHOTIC RECOMMENDATION ?Recommended Devices: ?- 1x pair prefabricated PDAC approved diabetic shoes; Patient Selected Apex X801W Size Men's 12.5XW ?- 3x pair custom-to-patient PDAC approved vacuum formed diabetic insoles. ? ?GOALS OF SHOES AND INSOLES ?- Reduce shear and pressure ?- Reduce / Prevent callus formation ?- Reduce / Prevent ulceration ?- Protect the fragile healing compromised diabetic foot. ? ?Patient would benefit from diabetic shoes and inserts as patient has diabetes mellitus and the patient has one or more of the following conditions: ?- History of partial or complete amputation of the foot ?- History of previous foot ulceration. ?- History of pre-ulcerative callus ?- Peripheral neuropathy with evidence of callus formation ?- Foot deformity ?- Poor circulation ? ?ACTIONS PERFORMED ?Potential out of pocket cost was communicated to patient. Patient understood and consented to measurement and casting. Patient was casted for insoles via crush box and measured for shoes via brannock device. Procedure was explained and patient tolerated procedure well. All questions were answered and concerns addressed. Casts were shipped to central fabrication for HOLD until Certificate of Medical Necessity or otherwise necessary authorization from insurance is  obtained. ? ?PLAN ?Shoes are to be ordered and casts released from hold once all appropriate paperwork is complete. Patient is to be contacted and scheduled for fitting once shoes and insoles have been fabricated and received. ? ?

## 2021-04-25 ENCOUNTER — Encounter (HOSPITAL_COMMUNITY): Payer: Self-pay | Admitting: Gastroenterology

## 2021-04-25 NOTE — Progress Notes (Signed)
Attempted to obtain medical history via telephone, unable to reach at this time. I left a voicemail to return pre surgical testing department's phone call.  

## 2021-04-30 NOTE — Anesthesia Preprocedure Evaluation (Addendum)
Anesthesia Evaluation  ?Patient identified by MRN, date of birth, ID band ?Patient awake ? ? ? ?Reviewed: ?Allergy & Precautions, NPO status , Patient's Chart, lab work & pertinent test results, reviewed documented beta blocker date and time  ? ?History of Anesthesia Complications ?Negative for: history of anesthetic complications ? ?Airway ?Mallampati: II ? ?TM Distance: >3 FB ?Neck ROM: Full ? ? ? Dental ? ?(+) Dental Advisory Given, Partial Upper, Partial Lower ?  ?Pulmonary ?asthma , COPD, former smoker,  ?  ?Pulmonary exam normal ? ? ? ? ? ? ? Cardiovascular ?hypertension, Pt. on medications and Pt. on home beta blockers ?Normal cardiovascular exam+ dysrhythmias Atrial Fibrillation  ? ? ?S/p TAVR ? ?'23 TTE - EF 60 to 65%. There is mild left ventricular hypertrophy. Left atrial size was mildly dilated. Trivial MR. Post TAVR with 26 mm Sapien valve Trivial PVL mean gradient 11 peak 22 mmHg AVA 2.8 cm2. There is mild dilatation of the ascending aorta, measuring 38 mm.  ? ?  ?Neuro/Psych ?negative neurological ROS ? negative psych ROS  ? GI/Hepatic ?negative GI ROS, Neg liver ROS,   ?Endo/Other  ?diabetes, Type 2, Insulin DependentMorbid obesity ? Renal/GU ?CRFRenal disease  ? ?  ?Musculoskeletal ? ?(+) Arthritis ,  ? Abdominal ?  ?Peds ? Hematology ? ?On eliquis ?   ?Anesthesia Other Findings ? ? Reproductive/Obstetrics ? ?  ? ? ? ? ? ? ? ? ? ? ? ? ? ?  ?  ? ? ? ? ? ? ? ?Anesthesia Physical ?Anesthesia Plan ? ?ASA: 3 ? ?Anesthesia Plan: MAC  ? ?Post-op Pain Management: Minimal or no pain anticipated  ? ?Induction:  ? ?PONV Risk Score and Plan: 2 and Propofol infusion and Treatment may vary due to age or medical condition ? ?Airway Management Planned: Nasal Cannula and Natural Airway ? ?Additional Equipment: None ? ?Intra-op Plan:  ? ?Post-operative Plan:  ? ?Informed Consent: I have reviewed the patients History and Physical, chart, labs and discussed the procedure including the  risks, benefits and alternatives for the proposed anesthesia with the patient or authorized representative who has indicated his/her understanding and acceptance.  ? ? ? ? ? ?Plan Discussed with: CRNA and Anesthesiologist ? ?Anesthesia Plan Comments:   ? ? ? ? ? ?Anesthesia Quick Evaluation ? ?

## 2021-05-01 ENCOUNTER — Ambulatory Visit (HOSPITAL_COMMUNITY): Payer: No Typology Code available for payment source | Admitting: Anesthesiology

## 2021-05-01 ENCOUNTER — Ambulatory Visit (HOSPITAL_BASED_OUTPATIENT_CLINIC_OR_DEPARTMENT_OTHER): Payer: No Typology Code available for payment source | Admitting: Anesthesiology

## 2021-05-01 ENCOUNTER — Ambulatory Visit (HOSPITAL_COMMUNITY)
Admission: RE | Admit: 2021-05-01 | Discharge: 2021-05-01 | Disposition: A | Discharge status: Home / Self Care | Payer: No Typology Code available for payment source | Attending: Gastroenterology | Admitting: Gastroenterology

## 2021-05-01 ENCOUNTER — Encounter (HOSPITAL_COMMUNITY)
Admission: RE | Disposition: A | Discharge status: Home / Self Care | Payer: Self-pay | Source: Home / Self Care | Attending: Gastroenterology

## 2021-05-01 ENCOUNTER — Encounter (HOSPITAL_COMMUNITY): Payer: Self-pay | Admitting: Gastroenterology

## 2021-05-01 ENCOUNTER — Other Ambulatory Visit: Payer: Self-pay

## 2021-05-01 DIAGNOSIS — Z6841 Body Mass Index (BMI) 40.0 and over, adult: Secondary | ICD-10-CM | POA: Insufficient documentation

## 2021-05-01 DIAGNOSIS — Z952 Presence of prosthetic heart valve: Secondary | ICD-10-CM

## 2021-05-01 DIAGNOSIS — D123 Benign neoplasm of transverse colon: Secondary | ICD-10-CM | POA: Diagnosis not present

## 2021-05-01 DIAGNOSIS — R1310 Dysphagia, unspecified: Secondary | ICD-10-CM | POA: Diagnosis not present

## 2021-05-01 DIAGNOSIS — N183 Chronic kidney disease, stage 3 unspecified: Secondary | ICD-10-CM | POA: Insufficient documentation

## 2021-05-01 DIAGNOSIS — K635 Polyp of colon: Secondary | ICD-10-CM | POA: Diagnosis not present

## 2021-05-01 DIAGNOSIS — Z800 Family history of malignant neoplasm of digestive organs: Secondary | ICD-10-CM | POA: Diagnosis not present

## 2021-05-01 DIAGNOSIS — K222 Esophageal obstruction: Secondary | ICD-10-CM

## 2021-05-01 DIAGNOSIS — K449 Diaphragmatic hernia without obstruction or gangrene: Secondary | ICD-10-CM | POA: Diagnosis not present

## 2021-05-01 DIAGNOSIS — I4891 Unspecified atrial fibrillation: Secondary | ICD-10-CM | POA: Insufficient documentation

## 2021-05-01 DIAGNOSIS — I129 Hypertensive chronic kidney disease with stage 1 through stage 4 chronic kidney disease, or unspecified chronic kidney disease: Secondary | ICD-10-CM | POA: Insufficient documentation

## 2021-05-01 DIAGNOSIS — R1314 Dysphagia, pharyngoesophageal phase: Secondary | ICD-10-CM

## 2021-05-01 DIAGNOSIS — Z794 Long term (current) use of insulin: Secondary | ICD-10-CM | POA: Insufficient documentation

## 2021-05-01 DIAGNOSIS — K2970 Gastritis, unspecified, without bleeding: Secondary | ICD-10-CM

## 2021-05-01 DIAGNOSIS — Z79899 Other long term (current) drug therapy: Secondary | ICD-10-CM | POA: Insufficient documentation

## 2021-05-01 DIAGNOSIS — K5790 Diverticulosis of intestine, part unspecified, without perforation or abscess without bleeding: Secondary | ICD-10-CM | POA: Diagnosis not present

## 2021-05-01 DIAGNOSIS — K640 First degree hemorrhoids: Secondary | ICD-10-CM | POA: Insufficient documentation

## 2021-05-01 DIAGNOSIS — Z7901 Long term (current) use of anticoagulants: Secondary | ICD-10-CM | POA: Diagnosis not present

## 2021-05-01 DIAGNOSIS — E1122 Type 2 diabetes mellitus with diabetic chronic kidney disease: Secondary | ICD-10-CM | POA: Diagnosis not present

## 2021-05-01 DIAGNOSIS — K5730 Diverticulosis of large intestine without perforation or abscess without bleeding: Secondary | ICD-10-CM | POA: Diagnosis not present

## 2021-05-01 DIAGNOSIS — Z1211 Encounter for screening for malignant neoplasm of colon: Secondary | ICD-10-CM | POA: Insufficient documentation

## 2021-05-01 DIAGNOSIS — Z87891 Personal history of nicotine dependence: Secondary | ICD-10-CM | POA: Diagnosis not present

## 2021-05-01 HISTORY — PX: ESOPHAGOGASTRODUODENOSCOPY (EGD) WITH PROPOFOL: SHX5813

## 2021-05-01 HISTORY — PX: MALONEY DILATION: SHX5535

## 2021-05-01 HISTORY — PX: POLYPECTOMY: SHX5525

## 2021-05-01 HISTORY — PX: COLONOSCOPY WITH PROPOFOL: SHX5780

## 2021-05-01 HISTORY — PX: BIOPSY: SHX5522

## 2021-05-01 LAB — GLUCOSE, CAPILLARY: Glucose-Capillary: 101 mg/dL — ABNORMAL HIGH (ref 70–99)

## 2021-05-01 SURGERY — ESOPHAGOGASTRODUODENOSCOPY (EGD) WITH PROPOFOL
Anesthesia: Monitor Anesthesia Care

## 2021-05-01 MED ORDER — PROPOFOL 500 MG/50ML IV EMUL
INTRAVENOUS | Status: DC | PRN
  Administered 2021-05-01: 150 ug/kg/min via INTRAVENOUS

## 2021-05-01 MED ORDER — LACTATED RINGERS IV SOLN
INTRAVENOUS | Status: DC
  Administered 2021-05-01: 1000 mL via INTRAVENOUS

## 2021-05-01 MED ORDER — SODIUM CHLORIDE 0.9 % IV SOLN: INTRAVENOUS | Status: DC

## 2021-05-01 MED ORDER — PROPOFOL 10 MG/ML IV BOLUS
INTRAVENOUS | Status: AC
Start: 2021-05-01 — End: ?
  Filled 2021-05-01: qty 20

## 2021-05-01 MED ORDER — OMEPRAZOLE MAGNESIUM 20 MG PO TBEC: 20.0000 mg | DELAYED_RELEASE_TABLET | Freq: Every day | ORAL | 4 refills | Status: DC

## 2021-05-01 MED ORDER — PROPOFOL 10 MG/ML IV BOLUS
INTRAVENOUS | Status: DC | PRN
  Administered 2021-05-01 (×5): 20 mg via INTRAVENOUS

## 2021-05-01 MED ORDER — LIDOCAINE HCL (CARDIAC) PF 100 MG/5ML IV SOSY
PREFILLED_SYRINGE | INTRAVENOUS | Status: DC | PRN
  Administered 2021-05-01: 40 mg via INTRAVENOUS
  Administered 2021-05-01: 60 mg via INTRAVENOUS

## 2021-05-01 SURGICAL SUPPLY — 25 items

## 2021-05-01 NOTE — Anesthesia Postprocedure Evaluation (Signed)
Anesthesia Post Note ? ?Patient: Rose Miller ? ?Procedure(s) Performed: ESOPHAGOGASTRODUODENOSCOPY (EGD) WITH PROPOFOL ?COLONOSCOPY WITH PROPOFOL ?BIOPSY ?MALONEY DILATION ?POLYPECTOMY ? ?  ? ?Patient location during evaluation: PACU ?Anesthesia Type: MAC ?Level of consciousness: awake and alert ?Pain management: pain level controlled ?Vital Signs Assessment: post-procedure vital signs reviewed and stable ?Respiratory status: spontaneous breathing, nonlabored ventilation and respiratory function stable ?Cardiovascular status: stable and blood pressure returned to baseline ?Anesthetic complications: no ? ? ?No notable events documented. ? ?Last Vitals:  ?Vitals:  ? 05/01/21 0850 05/01/21 0900  ?BP: (!) 105/33 (!) 126/57  ?Pulse: (!) 48 60  ?Resp: 16 16  ?Temp:    ?SpO2: 93% 96%  ?  ?Last Pain:  ?Vitals:  ? 05/01/21 0900  ?TempSrc:   ?PainSc: 0-No pain  ? ? ?  ?  ?  ?  ?  ?  ? ?Audry Pili ? ? ? ? ?

## 2021-05-01 NOTE — Transfer of Care (Signed)
Immediate Anesthesia Transfer of Care Note ? ?Patient: Rose Miller ? ?Procedure(s) Performed: ESOPHAGOGASTRODUODENOSCOPY (EGD) WITH PROPOFOL ?COLONOSCOPY WITH PROPOFOL ?BIOPSY ?MALONEY DILATION ?POLYPECTOMY ? ?Patient Location: PACU ? ?Anesthesia Type:MAC ? ?Level of Consciousness: drowsy ? ?Airway & Oxygen Therapy: Patient Spontanous Breathing and Patient connected to face mask oxygen ? ?Post-op Assessment: Report given to RN and Post -op Vital signs reviewed and stable ? ?Post vital signs: Reviewed and stable ? ?Last Vitals:  ?Vitals Value Taken Time  ?BP    ?Temp    ?Pulse    ?Resp    ?SpO2    ? ? ?Last Pain:  ?Vitals:  ? 05/01/21 0638  ?TempSrc: Oral  ?PainSc: 0-No pain  ?   ? ?  ? ?Complications: No notable events documented. ?

## 2021-05-01 NOTE — H&P (Signed)
?  ?Chief Complaint: Family history of colon cancer and dysphagia ?  ?HPI: ?   Rose Miller is a 66 year old female with a past medical history as listed below including A-fib on Eliquis, severe aortic stenosis status post TAVR in December, CKD stage III, COPD and obesity, known to Dr. Carlean Purl, who returns to clinic today for follow-up of her family history of colon cancer and a new symptom of dysphagia. ?   06/07/2014 colonoscopy with severe diverticulosis in the left colon otherwise normal. Repeat recommended in 5 years given father's history of colon cancer.  ?   10/18/2020 patient seen in clinic and at that time wanted to have her colonoscopy given family history of colon cancer in her father.  We discussed her moderate aortic stenosis and her BMI over 50 which would mean she had to have her procedure at the hospital.  She was told to take some time and see if she can get her weight down in order to be able to do this possibly here. ?   Today, patient explains that she has been able to lose weight with a change in her diet.  BMI today is 48.78.  Also today brings up a new complaint of problems swallowing, typically this is just with water and at random times but happens at least 1 or 2 times a week.  Tells me she will drink a sip of water and it feels like everything just stops up and she has to wait until it opens again in order to move down.  Tells me she drinks room temperature water.  This does not tend to happen with food.  Denies overt heartburn or reflux. ?   Patient is also status post TAVR in December and doing well per cardiology follow-up recently.  She was diagnosed with A-fib and started on Eliquis around that time as well. ?   Denies fever, chills, weight loss, change in bowel habits or blood in her stool. ?  ?    ?Past Medical History:  ?Diagnosis Date  ? Adhesive capsulitis    ? Aortic valve disease    ? Arthritis    ? Asthma    ? Back pain    ? CKD (chronic kidney disease) stage 3, GFR 30-59  ml/min (HCC)    ? Constipation    ? COPD (chronic obstructive pulmonary disease) ([place])    ? Diabetes mellitus without complication ([place])    ? Diuretic-induced hypokalemia    ? Hypercholesterolemia    ? Hypertension    ? IT band syndrome    ? Morbid obesity ([place])    ? Palpitations    ? Periumbilical pain    ? Postmenopausal estrogen deficiency    ? Prediabetes    ? Ventral hernia    ?  ?  ?     ?Past Surgical History:  ?Procedure Laterality Date  ? [place]  ? COLONOSCOPY      ? INTRAOPERATIVE TRANSTHORACIC ECHOCARDIOGRAM N/A 12/26/2020  ?  Procedure: INTRAOPERATIVE TRANSTHORACIC ECHOCARDIOGRAM;  Surgeon: Early Osmond, MD;  Location: [facility];  Service: Open Heart Surgery;  Laterality: N/A;  ? RIGHT/LEFT HEART CATH AND CORONARY ANGIOGRAPHY N/A 11/24/2020  ?  Procedure: RIGHT/LEFT HEART CATH AND CORONARY ANGIOGRAPHY;  Surgeon: Belva Crome, MD;  Location: [facility];  Service: Cardiovascular;  Laterality: N/A;  ? [place]  ? TRANSCATHETER AORTIC VALVE REPLACEMENT, TRANSFEMORAL N/A 12/26/2020  ?  Procedure:  TRANSCATHETER AORTIC VALVE REPLACEMENT, TRANSFEMORAL;  Surgeon: Early Osmond, MD;  Location: [facility];  Service: Open Heart Surgery;  Laterality: N/A;  ?  ?  ?      ?Current Outpatient Medications  ?Medication Sig Dispense Refill  ? amLODipine (NORVASC) 10 MG tablet Take 1 tablet (10 mg total) by mouth daily. 90 tablet 3  ? amoxicillin (AMOXIL) 500 MG tablet Take 4 tablets by mouth 1 hour prior to dental procedures and cleanings 12 tablet 6  ? apixaban (ELIQUIS) 5 MG TABS tablet Take 1 tablet (5 mg total) by mouth 2 (two) times daily. 180 tablet 3  ? Ascorbic Acid (VITAMIN C) 1000 MG tablet Take 1,000 mg by mouth daily.      ? atorvastatin (LIPITOR) 10 MG tablet Take 1 tablet (10 mg total) by mouth daily. 90 tablet 3  ? Bayer Microlet Lancets lancets Use when testing glucose as recommended.      ? Blood Pressure Monitoring  (SPHYGMOMANOMETER) MISC 1 Units by Does not apply route daily. 1 each 0  ? Cholecalciferol 50 MCG (2000 UT) TABS Take 2,000 Units by mouth daily.      ? Coenzyme Q10 (CO Q-10) 100 MG CAPS Take 100 mg by mouth daily.       ? Cyanocobalamin (B-12) 500 MCG TABS Take 500 mcg by mouth daily.      ? diltiazem (CARDIZEM) 30 MG tablet Take 1 tablet (30 mg total) by mouth every 6 (six) hours as needed (fast heart rate, heart palpitations). 14 tablet 0  ? glucose blood (CONTOUR NEXT TEST) test strip Test up to 4 times per day as recommended      ? hydrALAZINE (APRESOLINE) 50 MG tablet Take 1 tablet (50 mg total) by mouth 3 (three) times daily. 270 tablet 3  ? Insulin Pen Needle (BD PEN NEEDLE NANO 2ND GEN) 32G X 4 MM MISC 1 Package by Does not apply route 2 (two) times daily. 100 each 0  ? losartan (COZAAR) 100 MG tablet Take 1 tablet (100 mg total) by mouth daily. 90 tablet 3  ? Menthol, Topical Analgesic, (BLUE-EMU MAXIMUM STRENGTH) 2.5 % LIQD Apply 1 application topically daily as needed (pain).      ? metoprolol succinate (TOPROL-XL) 25 MG 24 hr tablet Take 1 tablet (25 mg total) by mouth daily. 90 tablet 3  ? neomycin-polymyxin-hydrocortisone (CORTISPORIN) OTIC solution Apply 1-2 drops to toenail if there is soreness once daily after shower 10 mL 0  ? spironolactone (ALDACTONE) 25 MG tablet Take 0.5 tablets (12.5 mg total) by mouth daily. 45 tablet 3  ? Zinc 50 MG CAPS Take 50 mg by mouth.      ?  ?No current facility-administered medications for this visit.  ?  ?  ?   ?Allergies as of 03/08/2021  ? (No Known Allergies)  ?  ?  ?     ?Family History  ?Problem Relation Age of Onset  ? Arthritis Mother    ? Cancer Mother    ?      pancreatic  ? Hypertension Mother    ? Obesity Mother    ? Cancer Father    ?      prostate and colon  ? Colon cancer Father    ? Heart disease Father    ? Thyroid disease Father    ?  ?  ?Social History  ?  ?     ?Socioeconomic History  ? Marital status: Single  ?  Spouse name: Not on file  ?  Number of children: 1  ? Years of education: Not on file  ? Highest education level: Not on file  ?Occupational History  ? Occupation: retired  ?Tobacco Use  ? Smoking status: Former  ?    Types: Cigarettes  ? Smokeless tobacco: Never  ?Vaping Use  ? Vaping Use: Never used  ?Substance and Sexual Activity  ? Alcohol use: Yes  ?    Comment: socially  ? Drug use: No  ? Sexual activity: Not on file  ?Other Topics Concern  ? Not on file  ?Social History Narrative  ? Not on file  ?  ?Social Determinants of Health  ?  ?Financial Resource Strain: Not on file  ?Food Insecurity: Not on file  ?Transportation Needs: Not on file  ?Physical Activity: Not on file  ?Stress: Not on file  ?Social Connections: Not on file  ?Intimate Partner Violence: Not on file  ?  ?  ?Review of Systems:    ?Constitutional: No weight loss, fever or chills ?Cardiovascular: No chest pain ?Respiratory: No SOB ?Gastrointestinal: See HPI and otherwise negative ?  ? Physical Exam:  ?Vital signs: ?BP 130/82   Pulse (!) 54   Ht '5\' 10"'$  (1.778 m)   Wt (!) 340 lb (154.2 kg)   SpO2 98%   BMI 48.78 kg/m?   ?  ?Constitutional:   Pleasant obese African-American female appears to be in NAD, Well developed, Well nourished, alert and cooperative ?Respiratory: Respirations even and unlabored. Lungs clear to auscultation bilaterally.   No wheezes, crackles, or rhonchi.  ?Cardiovascular: Normal S1, S2. No MRG. Regular rate and rhythm. No peripheral edema, cyanosis or pallor.  ?Gastrointestinal:  Soft, nondistended, nontender. No rebound or guarding. Normal bowel sounds. No appreciable masses or hepatomegaly. ?Rectal:  Not performed.  ?Psychiatric: Oriented to person, place and time. Demonstrates good judgement and reason without abnormal affect or behaviors. ?  ?RELEVANT LABS AND IMAGING: ?CBC ?     ?   ?Component Value Date/Time  ?  WBC 6.8 12/30/2020 1003  ?  RBC 4.18 12/30/2020 1003  ?  HGB 11.1 (L) 12/30/2020 1003  ?  HGB 11.1 11/21/2020 1546  ?  HCT 36.0  12/30/2020 1003  ?  HCT 35.5 11/21/2020 1546  ?  PLT 170 12/30/2020 1003  ?  PLT 192 11/21/2020 1546  ?  MCV 86.1 12/30/2020 1003  ?  MCV 86 11/21/2020 1546  ?  MCH 26.6 12/30/2020 1003  ?  MCHC 30.8 12/30/2020 1003  ?  RDW 16.4

## 2021-05-01 NOTE — Discharge Instructions (Signed)
YOU HAD AN ENDOSCOPIC PROCEDURE TODAY: Refer to the procedure report and other information in the discharge instructions given to you for any specific questions about what was found during the examination. If this information does not answer your questions, please call [place] office at [phone] to clarify.  ° °YOU SHOULD EXPECT: Some feelings of bloating in the abdomen. Passage of more gas than usual. Walking can help get rid of the air that was put into your GI tract during the procedure and reduce the bloating. If you had a lower endoscopy (such as a colonoscopy or flexible sigmoidoscopy) you may notice spotting of blood in your stool or on the toilet paper. Some abdominal soreness may be present for a day or two, also. ° °DIET: Your first meal following the procedure should be a light meal and then it is ok to progress to your normal diet. A half-sandwich or bowl of soup is an example of a good first meal. Heavy or fried foods are harder to digest and may make you feel nauseous or bloated. Drink plenty of fluids but you should avoid alcoholic beverages for 24 hours. If you had a esophageal dilation, please see attached instructions for diet.   ° °ACTIVITY: Your care partner should take you home directly after the procedure. You should plan to take it easy, moving slowly for the rest of the day. You can resume normal activity the day after the procedure however YOU SHOULD NOT DRIVE, use power tools, machinery or perform tasks that involve climbing or major physical exertion for 24 hours (because of the sedation medicines used during the test).  ° °SYMPTOMS TO REPORT IMMEDIATELY: °A gastroenterologist can be reached at any hour. Please call [phone]  for any of the following symptoms:  °Following lower endoscopy (colonoscopy, flexible sigmoidoscopy) °Excessive amounts of blood in the stool  °Significant tenderness, worsening of abdominal pains  °Swelling of the abdomen that is new, acute  °Fever of 100° or  higher  °Following upper endoscopy (EGD, EUS, ERCP, esophageal dilation) °Vomiting of blood or coffee ground material  °New, significant abdominal pain  °New, significant chest pain or pain under the shoulder blades  °Painful or persistently difficult swallowing  °New shortness of breath  °Black, tarry-looking or red, bloody stools ° °FOLLOW UP:  °If any biopsies were taken you will be contacted by phone or by letter within the next 1-3 weeks. Call [phone]  if you have not heard about the biopsies in 3 weeks.  °Please also call with any specific questions about appointments or follow up tests. ° °

## 2021-05-01 NOTE — Op Note (Signed)
[facility] ?Patient Name: Rose Miller ?Procedure Date: 05/01/2021 ?MRN: [id number] ?Attending MD: Jackquline Denmark , MD ?Date of Birth: [birth year] ?CSN: [id number] ?Age: 66 ?Admit Type: Outpatient ?Procedure:                Colonoscopy ?Indications:              Screening in patient at increased risk: Colorectal  ?                          cancer in father before age 73 ?Providers:                Jackquline Denmark, MD, Jaci Carrel, RN, Narda Rutherford  ?                          [place], Technician, Ladoris Gene, RN, April  ?                          Eulas Post CRNA ?Referring MD:              ?Medicines:                Monitored Anesthesia Care ?Complications:            No immediate complications. ?Estimated Blood Loss:     Estimated blood loss: none. ?Procedure:                Pre-Anesthesia Assessment: ?                          - Prior to the procedure, a History and Physical  ?                          was performed, and patient medications and  ?                          allergies were reviewed. The patient's tolerance of  ?                          previous anesthesia was also reviewed. The risks  ?                          and benefits of the procedure and the sedation  ?                          options and risks were discussed with the patient.  ?                          All questions were answered, and informed consent  ?                          was obtained. Prior Anticoagulants: The patient has  ?                          taken Eliquis (apixaban), last dose was 2 days  ?  prior to procedure. ASA Grade Assessment: III - A  ?                          patient with severe systemic disease. After  ?                          reviewing the risks and benefits, the patient was  ?                          deemed in satisfactory condition to undergo the  ?                          procedure. ?                          After obtaining informed consent, the colonoscope  ?                           was passed under direct vision. Throughout the  ?                          procedure, the patient's blood pressure, pulse, and  ?                          oxygen saturations were monitored continuously. The  ?                          [id number]-[id number] ([id number]) Olympus colonoscope was  ?                          introduced through the anus and advanced to the 2  ?                          cm into the ileum. The colonoscopy was performed  ?                          without difficulty. The patient tolerated the  ?                          procedure well. The quality of the bowel  ?                          preparation was good. The terminal ileum, ileocecal  ?                          valve, appendiceal orifice, and rectum were  ?                          photographed. ?Scope In: 8:08:56 AM ?Scope Out: 8:22:20 AM ?Scope Withdrawal Time: 0 hours 9 minutes 29 seconds  ?Total Procedure Duration: 0 hours 13 minutes 24 seconds  ?Findings: ?     A 6 mm polyp was found in the distal transverse colon. The polyp was  ?     sessile. The polyp was removed with a cold  snare. Resection and  ?     retrieval were complete. ?     Multiple medium-mouthed diverticula were found in the sigmoid colon,  ?     descending colon and rare in ascending colon. Few diverticula in the  ?     sigmoid colon with stool impaction. No endoscopic evidence of  ?     diverticulitis. ?     Non-bleeding internal hemorrhoids were found during retroflexion. The  ?     hemorrhoids were small and Grade I (internal hemorrhoids that do not  ?     prolapse). ?     The terminal ileum appeared normal. ?     The exam was otherwise without abnormality on direct and retroflexion  ?     views. ?Impression:               - One 6 mm polyp in the distal transverse colon,  ?                          removed with a cold snare. Resected and retrieved. ?                          - Moderate predominantly left colonic  ?                          diverticulosis. ?                           - Non-bleeding internal hemorrhoids. ?                          - The examined portion of the ileum was normal. ?                          - The examination was otherwise normal on direct  ?                          and retroflexion views. ?Moderate Sedation: ?     Not Applicable - Patient had care per Anesthesia. ?Recommendation:           - Patient has a contact number available for  ?                          emergencies. The signs and symptoms of potential  ?                          delayed complications were discussed with the  ?                          patient. Return to normal activities tomorrow.  ?                          Written discharge instructions were provided to the  ?                          patient. ?                          -  High fiber diet. ?                          - Continue present medications. ?                          - Await pathology results. ?                          - Repeat colonoscopy in 5 years for surveillance. ?                          - Resume Eliquis (apixaban) at prior dose tomorrow. ?                          - The findings and recommendations were discussed  ?                          with the patient's family. ?Procedure Code(s):        --- Professional --- ?                          [phone], Colonoscopy, flexible; with removal of  ?                          tumor(s), polyp(s), or other lesion(s) by snare  ?                          technique ?Diagnosis Code(s):        --- Professional --- ?                          Z80.0, Family history of malignant neoplasm of  ?                          digestive organs ?                          K63.5, Polyp of colon ?                          K64.0, First degree hemorrhoids ?                          K57.30, Diverticulosis of large intestine without  ?                          perforation or abscess without bleeding ?CPT copyright 2019 [employer]. All rights reserved. ?The codes documented in this  report are preliminary and upon coder review may  ?be revised to meet current compliance requirements. ?Jackquline Denmark, MD ?05/01/2021 8:35:53 AM ?This report has been signed electronically. ?Number of Addenda: 0 ?

## 2021-05-01 NOTE — Anesthesia Procedure Notes (Signed)
Procedure Name: [place] ?Date/Time: 05/01/2021 7:50 AM ?Performed by: Lieutenant Diego, CRNA ?Pre-anesthesia Checklist: Patient identified, Emergency Drugs available, Suction available, Patient being monitored and Timeout performed ?Oxygen Delivery Method: Simple face mask ?Preoxygenation: Pre-oxygenation with 100% oxygen ?Induction Type: IV induction ? ? ? ? ?

## 2021-05-01 NOTE — Op Note (Signed)
[facility] ?Patient Name: Rose Miller ?Procedure Date: 05/01/2021 ?MRN: [id number] ?Attending MD: Jackquline Denmark , MD ?Date of Birth: Jul 20, 1955 ?CSN: [id number] ?Age: 66 ?Admit Type: Outpatient ?Procedure:                Upper GI endoscopy ?Indications:              Dysphagia ?Providers:                Jackquline Denmark, MD, Jaci Carrel, RN, Dewaine Oats  ?                          Muldrow, RN, [employer], Technician ?Referring MD:              ?Medicines:                Monitored Anesthesia Care ?Complications:            No immediate complications. ?Estimated Blood Loss:     Estimated blood loss: none. ?Procedure:                Pre-Anesthesia Assessment: ?                          - Prior to the procedure, a History and Physical  ?                          was performed, and patient medications and  ?                          allergies were reviewed. The patient's tolerance of  ?                          previous anesthesia was also reviewed. The risks  ?                          and benefits of the procedure and the sedation  ?                          options and risks were discussed with the patient.  ?                          All questions were answered, and informed consent  ?                          was obtained. Prior Anticoagulants: The patient has  ?                          taken Eliquis last dose 04/29/2021. ASA Grade  ?                          Assessment: III - A patient with severe systemic  ?                          disease. After reviewing the risks and benefits,  ?  the patient was deemed in satisfactory condition to  ?                          undergo the procedure. ?                          After obtaining informed consent, the endoscope was  ?                          passed under direct vision. Throughout the  ?                          procedure, the patient's blood pressure, pulse, and  ?                          oxygen saturations were monitored  continuously. The  ?                          [id number]-H190 ([id number]) Olympus endoscope was introduced  ?                          through the mouth, and advanced to the second part  ?                          of duodenum. The upper GI endoscopy was  ?                          accomplished without difficulty. The patient  ?                          tolerated the procedure well. ?Scope In: ?Scope Out: ?Findings: ?     A non-obstructing and moderate Schatzki ring with luminal diameter of  ?     approximately 14 mm was found at the gastroesophageal junction, 38 cm  ?     from incisors. Biopsies were obtained from the proximal and distal  ?     esophagus with cold forceps for histology of suspected eosinophilic  ?     esophagitis. The scope was withdrawn. Dilation was performed with a  ?     Maloney dilator with mild resistance at 52 Fr and 54 Fr. ?     A 2 cm hiatal hernia was present (extending from 38 up to 26 cm-GE Jn to  ?     diaphragmatic hiatus) ?     Localized mild inflammation characterized by erythema was found in the  ?     gastric antrum. Biopsies were taken with a cold forceps for histology. ?     The examined duodenum was normal. ?Impression:               - Non-obstructing and moderate Schatzki ring.  ?                          Biopsied. Dilated. ?                          - 2 cm hiatal hernia. ?                          -  Gastritis. Biopsied. ?                          - Normal examined duodenum. ?Moderate Sedation: ?     Not Applicable - Patient had care per Anesthesia. ?Recommendation:           - Patient has a contact number available for  ?                          emergencies. The signs and symptoms of potential  ?                          delayed complications were discussed with the  ?                          patient. Return to normal activities tomorrow.  ?                          Written discharge instructions were provided to the  ?                          patient. ?                          -  Resume previous diet. ?                          - Use Prilosec (omeprazole) 20 mg PO daily. ?                          - Await pathology results. ?                          - Proceed with colonoscopy. ?                          - The findings and recommendations were discussed  ?                          with the patient's family. ?Procedure Code(s):        --- Professional --- ?                          [phone], Esophagogastroduodenoscopy, flexible,  ?                          transoral; with biopsy, single or multiple ?                          43450, Dilation of esophagus, by unguided sound or  ?                          bougie, single or multiple passes ?Diagnosis Code(s):        --- Professional --- ?                          K22.2, Esophageal obstruction ?  K44.9, Diaphragmatic hernia without obstruction or  ?                          gangrene ?                          K29.70, Gastritis, unspecified, without bleeding ?                          R13.10, Dysphagia, unspecified ?CPT copyright 2019 American Medical Association. All rights reserved. ?The codes documented in this report are preliminary and upon coder review may  ?be revised to meet current compliance requirements. ?Jackquline Denmark, MD ?05/01/2021 8:31:20 AM ?This report has been signed electronically. ?Number of Addenda: 0 ?

## 2021-05-02 ENCOUNTER — Encounter (HOSPITAL_COMMUNITY): Payer: Self-pay | Admitting: Gastroenterology

## 2021-05-02 LAB — SURGICAL PATHOLOGY

## 2021-05-05 ENCOUNTER — Encounter: Payer: Self-pay | Admitting: Gastroenterology

## 2021-05-11 ENCOUNTER — Encounter: Payer: No Typology Code available for payment source | Admitting: Internal Medicine

## 2021-05-25 DIAGNOSIS — H524 Presbyopia: Secondary | ICD-10-CM | POA: Diagnosis not present

## 2021-05-25 DIAGNOSIS — Z0100 Encounter for examination of eyes and vision without abnormal findings: Secondary | ICD-10-CM | POA: Diagnosis not present

## 2021-05-25 LAB — HM DIABETES EYE EXAM

## 2021-05-31 ENCOUNTER — Encounter: Payer: Self-pay | Admitting: Family Medicine

## 2021-06-07 ENCOUNTER — Telehealth: Payer: Self-pay

## 2021-06-07 ENCOUNTER — Encounter: Payer: Self-pay | Admitting: Sports Medicine

## 2021-06-07 ENCOUNTER — Ambulatory Visit (INDEPENDENT_AMBULATORY_CARE_PROVIDER_SITE_OTHER): Payer: No Typology Code available for payment source | Admitting: Sports Medicine

## 2021-06-07 DIAGNOSIS — M79674 Pain in right toe(s): Secondary | ICD-10-CM

## 2021-06-07 DIAGNOSIS — E119 Type 2 diabetes mellitus without complications: Secondary | ICD-10-CM

## 2021-06-07 DIAGNOSIS — M2042 Other hammer toe(s) (acquired), left foot: Secondary | ICD-10-CM

## 2021-06-07 DIAGNOSIS — M2041 Other hammer toe(s) (acquired), right foot: Secondary | ICD-10-CM

## 2021-06-07 DIAGNOSIS — Z872 Personal history of diseases of the skin and subcutaneous tissue: Secondary | ICD-10-CM

## 2021-06-07 DIAGNOSIS — M2141 Flat foot [pes planus] (acquired), right foot: Secondary | ICD-10-CM

## 2021-06-07 DIAGNOSIS — M79675 Pain in left toe(s): Secondary | ICD-10-CM | POA: Diagnosis not present

## 2021-06-07 DIAGNOSIS — M2142 Flat foot [pes planus] (acquired), left foot: Secondary | ICD-10-CM

## 2021-06-07 DIAGNOSIS — B351 Tinea unguium: Secondary | ICD-10-CM | POA: Diagnosis not present

## 2021-06-07 NOTE — Progress Notes (Signed)
Subjective: Rose Miller is a 66 y.o. female patient with history of diabetes who returns to office today for long painful nails.  Patient unable to trim.    No other pedal complaints noted.  FBS not recorded A1c 5.9 or 6.1  Libby Maw, MD  Patient Active Problem List   Diagnosis Date Noted   Pharyngoesophageal dysphagia    Family history of colon cancer in father    Aortic valve disease 12/26/2020   COPD (chronic obstructive pulmonary disease) ([place]) 12/26/2020   S/P TAVR (transcatheter aortic valve replacement) 12/26/2020   Postmenopausal estrogen deficiency 02/18/2019   Former smoker 05/13/2018   Long-term use of aspirin therapy 05/13/2018   Class 3 severe obesity with serious comorbidity and body mass index (BMI) of 45.0 to 49.9 in adult [facility]) 03/31/2017   DM (diabetes mellitus) ([place]) 03/31/2017   CKD (chronic kidney disease) stage 3, GFR 30-59 ml/min (HCC) 03/31/2017   Hypercholesterolemia 25/05/3974   Periumbilical pain 73/41/9379   IT band syndrome 09/11/2014   Right shoulder pain 09/11/2014   Ventral hernia 05/01/2014   Essential hypertension 03/17/2014   Current Outpatient Medications on File Prior to Visit  Medication Sig Dispense Refill   amLODipine (NORVASC) 10 MG tablet Take 1 tablet (10 mg total) by mouth daily. 90 tablet 3   amoxicillin (AMOXIL) 500 MG tablet Take 4 tablets by mouth 1 hour prior to dental procedures and cleanings 12 tablet 6   apixaban (ELIQUIS) 5 MG TABS tablet Take 5 mg by mouth 2 (two) times daily.     Ascorbic Acid (VITAMIN C) 1000 MG tablet Take 1,000 mg by mouth daily.     atorvastatin (LIPITOR) 10 MG tablet Take 1 tablet (10 mg total) by mouth daily. 90 tablet 3   Bayer Microlet Lancets lancets Use when testing glucose as recommended.     Blood Pressure Monitoring (SPHYGMOMANOMETER) MISC 1 Units by Does not apply route daily. 1 each 0   Cholecalciferol 50 MCG (2000 UT) TABS Take 2,000 Units by mouth daily.     Coenzyme Q10 (CO  Q-10) 100 MG CAPS Take 100 mg by mouth daily.      Cyanocobalamin (B-12) 500 MCG TABS Take 500 mcg by mouth daily.     diltiazem (CARDIZEM) 30 MG tablet Take 1 tablet (30 mg total) by mouth every 6 (six) hours as needed (fast heart rate, heart palpitations). 14 tablet 0   fluticasone (FLONASE) 50 MCG/ACT nasal spray Place 2 sprays into both nostrils daily.     glucose blood (CONTOUR NEXT TEST) test strip Test up to 4 times per day as recommended     hydrALAZINE (APRESOLINE) 50 MG tablet Take 1 tablet (50 mg total) by mouth 3 (three) times daily. 270 tablet 3   Insulin Pen Needle (BD PEN NEEDLE NANO 2ND GEN) 32G X 4 MM MISC 1 Package by Does not apply route 2 (two) times daily. 100 each 0   losartan (COZAAR) 100 MG tablet Take 1 tablet (100 mg total) by mouth daily. 90 tablet 3   Menthol, Topical Analgesic, (BLUE-EMU MAXIMUM STRENGTH) 2.5 % LIQD Apply 1 application topically daily as needed (pain).     metoprolol succinate (TOPROL-XL) 25 MG 24 hr tablet Take 1 tablet (25 mg total) by mouth daily. 90 tablet 3   neomycin-polymyxin-hydrocortisone (CORTISPORIN) OTIC solution Apply 1-2 drops to toenail if there is soreness once daily after shower (Patient taking differently: Apply 1-2 drops to toenail if there is soreness once daily after shower as needed)  10 mL 0   omeprazole (PRILOSEC) 20 MG capsule Take 20 mg by mouth daily.     PEG-KCl-NaCl-NaSulf-Na Asc-C (PLENVU) 140 g SOLR Take 1 kit by mouth as directed. 1 each 0   spironolactone (ALDACTONE) 25 MG tablet Take 0.5 tablets (12.5 mg total) by mouth daily. 45 tablet 3   Zinc 50 MG TABS Take 50 mg by mouth daily.     No current facility-administered medications on file prior to visit.   No Known Allergies  Recent Results (from the past 2160 hour(s))  Glucose, capillary     Status: Abnormal   Collection Time: 05/01/21  7:01 AM  Result Value Ref Range   Glucose-Capillary 101 (H) 70 - 99 mg/dL    Comment: Glucose reference range applies only to  samples taken after fasting for at least 8 hours.  Surgical pathology     Status: None   Collection Time: 05/01/21  8:04 AM  Result Value Ref Range   SURGICAL PATHOLOGY      SURGICAL PATHOLOGY CASE: [id number] PATIENT: Rose Miller Surgical Pathology Report     Clinical History: Family hx of colon cancer (father) dysphagia ([facility])     FINAL MICROSCOPIC DIAGNOSIS:  A. STOMACH, ANTRUM, BODY, BIOPSY: - Gastric antral and oxyntic mucosa with no specific histopathologic changes - Helicobacter pylori-like organisms are not identified on routine HE stain  B. ESOPHAGUS, BIOPSY: - Esophageal squamous mucosa with no specific histopathologic changes - Negative for increased intraepithelial eosinophils  C. COLON, DISTAL TRANSVERSE, POLYPECTOMY: - Tubular adenoma - Negative for high-grade dysplasia or malignancy      GROSS DESCRIPTION:  A: Received in formalin are tan, soft tissue fragments that are submitted in toto. Number: 3 size: 0.3-0.6 cm blocks: 1  B: Received in formalin are tan, soft tissue fragments that are submitted in toto. Number: 8 size: 0.2-0.6 cm blocks: 1  C: Received in formalin is a tan, soft tissue fragme nt that is submitted in toto.  Size: 0.7 cm, 1 block submitted.  [facility] 05/01/2021)   Final Diagnosis performed by Jaquita Folds, MD.   Electronically signed 05/02/2021 Technical and / or Professional components performed at [facility], [place] [address], [place], [place] [address].  Immunohistochemistry Technical component (if applicable) was performed at [facility]. [address] [place], [place], [place] [address].   IMMUNOHISTOCHEMISTRY DISCLAIMER (if applicable): Some of these immunohistochemical stains may have been developed and the performance characteristics determine by [facility]. Some may not have been cleared or approved by the U.S. Food and Drug Administration. The FDA has  determined that such clearance or approval is not necessary. This test is used for clinical purposes. It should not be regarded as investigational or for research. This laboratory is certified under the [place] (CLIA-88) as qualified to perform high complexity clinical laboratory testing.  The controls stained appropriately.   HM DIABETES EYE EXAM     Status: None   Collection Time: 05/25/21 12:00 AM  Result Value Ref Range   HM Diabetic Eye Exam No Retinopathy No Retinopathy    Comment: Dr Colin Broach, OD    Objective: General: Patient is awake, alert, and oriented x 3 and in no acute distress.  Integument: Skin is warm, dry and supple bilateral. Nails are tender, long, thickened and dystrophic with subungual debris, consistent with onychomycosis, 1-5 bilateral.  Chronic incurvation of bilateral hallux nails with no acute signs of infection.  No open lesions  or preulcerative lesions present bilateral. Remaining integument unremarkable.  Vasculature:  Dorsalis Pedis pulse 1/4 bilateral. Posterior Tibial pulse 1/4 bilateral. Capillary fill time <3 sec 1-5 bilateral. Positive hair growth to the level of the digits.Temperature gradient within normal limits. No varicosities present bilateral. No edema present bilateral.   Neurology: Gross sensation present via light touch on today's exam.  Musculoskeletal: Asymptomatic hammertoe and pes planus pedal deformities noted bilateral. Muscular strength 5/5 in all lower extremity muscular groups bilateral without pain on range of motion, unchanged from prior.  No tenderness with calf compression bilateral.  Assessment and Plan: Problem List Items Addressed This Visit   None Visit Diagnoses     Pain due to onychomycosis of toenails of both feet    -  Primary   History of ingrowing nail       Hammer toes of both feet       Pes planus of both feet       Diabetes mellitus without complication ([place])            -Examined patient. -Re-Discussed and educated patient on diabetic foot care, especially with  regards to the vascular, neurological and musculoskeletal systems.  -Mechanically debrided all painful nails 1-5 bilateral using sterile nail nipper and filed with dremel without incident.  -Continue with daily skin emollients  -Answered all patient questions -Patient to return  in 2.5 months for diabetic nail care or sooner if problems or issues arise.  Landis Martins, DPM

## 2021-06-07 NOTE — Telephone Encounter (Signed)
Patient seen in the office following Dr. Leeanne Rio appointment. Patient reports her medicare replacement plan [employer] insists that [facility] will provide Diabetic Shoes at 100% coverage. Explained to patient that it is Environmental manager that they do not provide diabetic shoes or insoles. Called to confirm with the local [facility]. Patient insisted they wanted to try anyway and cancelled their order with [place] because I could not guarantee 100% coverage. Patient reports that if they discover differently than what they understand their insurance to have led them to believe in person, she will return to re-start the order.

## 2021-06-12 ENCOUNTER — Ambulatory Visit (INDEPENDENT_AMBULATORY_CARE_PROVIDER_SITE_OTHER): Payer: No Typology Code available for payment source | Admitting: Family Medicine

## 2021-06-12 ENCOUNTER — Encounter: Payer: Self-pay | Admitting: Family Medicine

## 2021-06-12 ENCOUNTER — Ambulatory Visit: Payer: Medicare Other | Admitting: Nurse Practitioner

## 2021-06-12 VITALS — BP 128/72 | HR 67 | Temp 96.5°F | Ht 70.0 in | Wt 359.0 lb

## 2021-06-12 DIAGNOSIS — I10 Essential (primary) hypertension: Secondary | ICD-10-CM

## 2021-06-12 DIAGNOSIS — E1165 Type 2 diabetes mellitus with hyperglycemia: Secondary | ICD-10-CM | POA: Diagnosis not present

## 2021-06-12 DIAGNOSIS — N1832 Chronic kidney disease, stage 3b: Secondary | ICD-10-CM

## 2021-06-12 DIAGNOSIS — E785 Hyperlipidemia, unspecified: Secondary | ICD-10-CM | POA: Diagnosis not present

## 2021-06-12 DIAGNOSIS — Z6841 Body Mass Index (BMI) 40.0 and over, adult: Secondary | ICD-10-CM

## 2021-06-12 DIAGNOSIS — Z23 Encounter for immunization: Secondary | ICD-10-CM | POA: Diagnosis not present

## 2021-06-12 LAB — CBC
HCT: 36.2 % (ref 36.0–46.0)
Hemoglobin: 11.7 g/dL — ABNORMAL LOW (ref 12.0–15.0)
MCHC: 32.2 g/dL (ref 30.0–36.0)
MCV: 86.6 fl (ref 78.0–100.0)
Platelets: 157 10*3/uL (ref 150.0–400.0)
RBC: 4.19 Mil/uL (ref 3.87–5.11)
RDW: 15.4 % (ref 11.5–15.5)
WBC: 5.2 10*3/uL (ref 4.0–10.5)

## 2021-06-12 LAB — COMPREHENSIVE METABOLIC PANEL
ALT: 14 U/L (ref 0–35)
AST: 15 U/L (ref 0–37)
Albumin: 4.1 g/dL (ref 3.5–5.2)
Alkaline Phosphatase: 84 U/L (ref 39–117)
BUN: 14 mg/dL (ref 6–23)
CO2: 30 mEq/L (ref 19–32)
Calcium: 9.2 mg/dL (ref 8.4–10.5)
Chloride: 105 mEq/L (ref 96–112)
Creatinine, Ser: 1.25 mg/dL — ABNORMAL HIGH (ref 0.40–1.20)
GFR: 44.62 mL/min — ABNORMAL LOW (ref >60.00)
Glucose, Bld: 133 mg/dL — ABNORMAL HIGH (ref 70–99)
Potassium: 3.7 mEq/L (ref 3.5–5.1)
Sodium: 141 mEq/L (ref 135–145)
Total Bilirubin: 0.5 mg/dL (ref 0.2–1.2)
Total Protein: 6.9 g/dL (ref 6.0–8.3)

## 2021-06-12 LAB — URINALYSIS, ROUTINE W REFLEX MICROSCOPIC
Bilirubin Urine: NEGATIVE
Hgb urine dipstick: NEGATIVE
Ketones, ur: NEGATIVE
Leukocytes,Ua: NEGATIVE
Nitrite: NEGATIVE
RBC / HPF: NONE SEEN (ref 0–?)
Specific Gravity, Urine: 1.025 (ref 1.000–1.030)
Urine Glucose: NEGATIVE
Urobilinogen, UA: 0.2 (ref 0.0–1.0)
pH: 5.5 (ref 5.0–8.0)

## 2021-06-12 LAB — LIPID PANEL
Cholesterol: 141 mg/dL (ref 0–200)
HDL: 53.7 mg/dL (ref >39.00)
LDL Cholesterol: 69 mg/dL (ref 0–99)
NonHDL: 87.43
Total CHOL/HDL Ratio: 3
Triglycerides: 93 mg/dL (ref 0.0–149.0)
VLDL: 18.6 mg/dL (ref 0.0–40.0)

## 2021-06-12 LAB — MICROALBUMIN / CREATININE URINE RATIO
Creatinine,U: 355.1 mg/dL
Microalb Creat Ratio: 0.9 mg/g (ref 0.0–30.0)
Microalb, Ur: 3.2 mg/dL — ABNORMAL HIGH (ref 0.0–1.9)

## 2021-06-12 LAB — HEMOGLOBIN A1C: Hgb A1c MFr Bld: 6.6 % — ABNORMAL HIGH (ref 4.6–6.5)

## 2021-06-12 NOTE — Progress Notes (Signed)
Established Patient Office Visit  Subjective   Patient ID: Rose Miller, female    DOB: Jul 28, 1955  Age: 66 y.o. MRN: [id number]  Chief Complaint  Patient presents with   Transitions Of Care    [facility] from [facility], no concerns. Patient fasting.     HPI follow-up of obesity, hypertension, hyperlipidemia, hypertension type 2 diabetes and stage III CKD.  Type 2 diabetes is currently untreated.  She is reluctant to take metformin because of what she has heard about the medication.  She had taken Ozempic in the past for weight loss but did not like sticking herself.  Recent eye check.  Hypertension is currently treated with amlodipine, diltiazem,hydralzine, losartan and spironolactone by cardiology.  She is on low-dose atorvastatin for hyperlipidemia.  She is status post TAVR this past December and is taking Eliquis.    Review of Systems  Constitutional:  Negative for chills, diaphoresis, malaise/fatigue and weight loss.  HENT: Negative.    Eyes: Negative.  Negative for blurred vision and double vision.  Cardiovascular:  Negative for chest pain.  Gastrointestinal:  Negative for abdominal pain.  Genitourinary: Negative.   Musculoskeletal:  Negative for falls and myalgias.  Neurological:  Negative for speech change, loss of consciousness and weakness.  Psychiatric/Behavioral: Negative.       Objective:     BP 128/72 (BP Location: Right Leg, Patient Position: Sitting, Cuff Size: Large)   Pulse 67   Temp (!) 96.5 F (35.8 C) (Temporal)   Ht '5\' 10"'$  (1.778 m)   Wt (!) 359 lb (162.8 kg)   SpO2 96%   BMI 51.51 kg/m  BP Readings from Last 3 Encounters:  06/12/21 128/72  05/01/21 (!) 126/57  03/20/21 138/70   Wt Readings from Last 3 Encounters:  06/12/21 (!) 359 lb (162.8 kg)  05/01/21 (!) 337 lb 15.4 oz (153.3 kg)  03/20/21 (!) 338 lb (153.3 kg)      Physical Exam Constitutional:      General: She is not in acute distress.    Appearance: Normal appearance. She is  obese. She is not ill-appearing, toxic-appearing or diaphoretic.  HENT:     Head: Normocephalic and atraumatic.     Right Ear: External ear normal.     Left Ear: External ear normal.     Mouth/Throat:     Mouth: Mucous membranes are moist.     Pharynx: Oropharynx is clear. No oropharyngeal exudate or posterior oropharyngeal erythema.  Eyes:     General: No scleral icterus.       Right eye: No discharge.        Left eye: No discharge.     Extraocular Movements: Extraocular movements intact.     Conjunctiva/sclera: Conjunctivae normal.     Pupils: Pupils are equal, round, and reactive to light.  Cardiovascular:     Rate and Rhythm: Normal rate and regular rhythm.  Pulmonary:     Effort: Pulmonary effort is normal. No respiratory distress.     Breath sounds: Normal breath sounds.  Abdominal:     General: Bowel sounds are normal.  Musculoskeletal:     Cervical back: No rigidity or tenderness.  Skin:    General: Skin is warm and dry.  Neurological:     Mental Status: She is alert and oriented to person, place, and time.  Psychiatric:        Mood and Affect: Mood normal.        Behavior: Behavior normal.     No results found  for any visits on 06/12/21.    The 10-year ASCVD risk score (Arnett DK, et al., 2019) is: 16.7%    Assessment & Plan:   Problem List Items Addressed This Visit       Cardiovascular and Mediastinum   Essential hypertension   Relevant Orders   CBC   Comprehensive metabolic panel     Endocrine   DM (diabetes mellitus) ([place]) - Primary   Relevant Orders   Comprehensive metabolic panel   Hemoglobin A1c   Urinalysis, Routine w reflex microscopic   Microalbumin / creatinine urine ratio     Genitourinary   CKD (chronic kidney disease) stage 3, GFR 30-59 ml/min (HCC)   Relevant Orders   Comprehensive metabolic panel     Other   Hyperlipidemia LDL goal <70   Relevant Orders   Comprehensive metabolic panel   Lipid panel   Class 3 severe obesity  due to excess calories with body mass index (BMI) of 50.0 to 59.9 in adult [facility])   Relevant Orders   Ambulatory referral to General Surgery    Return in about 3 months (around 09/12/2021).  Agrees to see bariatrics.  Discussed treatments for her diabetes.  Could consider Rybelsus and/or an SGLT2 inhibitor.  Libby Maw, MD

## 2021-06-15 ENCOUNTER — Telehealth: Payer: Self-pay | Admitting: Family Medicine

## 2021-06-15 NOTE — Telephone Encounter (Signed)
Pt needs her diabetic shoes ordered. Fax order to [facility] [phone]. Please call pt when order is placed.

## 2021-06-29 ENCOUNTER — Ambulatory Visit: Payer: No Typology Code available for payment source | Admitting: Interventional Cardiology

## 2021-06-29 NOTE — Telephone Encounter (Signed)
Notes faxed to provided number.

## 2021-07-04 ENCOUNTER — Telehealth: Payer: Self-pay

## 2021-07-04 DIAGNOSIS — E1165 Type 2 diabetes mellitus with hyperglycemia: Secondary | ICD-10-CM

## 2021-07-04 NOTE — Telephone Encounter (Signed)
Patient calling regarding diabetic shoes would like to know if requested information could be sent to [facility]. Needing Rx for diabetic shoes. Okay to send Rx please advise.

## 2021-07-04 NOTE — Telephone Encounter (Signed)
Patient aware that order and form both signed and faxed back to [facility].

## 2021-08-13 ENCOUNTER — Encounter: Payer: Self-pay | Admitting: Podiatry

## 2021-08-13 ENCOUNTER — Ambulatory Visit (INDEPENDENT_AMBULATORY_CARE_PROVIDER_SITE_OTHER): Payer: No Typology Code available for payment source | Admitting: Podiatry

## 2021-08-13 DIAGNOSIS — M2141 Flat foot [pes planus] (acquired), right foot: Secondary | ICD-10-CM

## 2021-08-13 DIAGNOSIS — B351 Tinea unguium: Secondary | ICD-10-CM | POA: Diagnosis not present

## 2021-08-13 DIAGNOSIS — E119 Type 2 diabetes mellitus without complications: Secondary | ICD-10-CM

## 2021-08-13 DIAGNOSIS — L600 Ingrowing nail: Secondary | ICD-10-CM | POA: Diagnosis not present

## 2021-08-13 DIAGNOSIS — M2041 Other hammer toe(s) (acquired), right foot: Secondary | ICD-10-CM | POA: Diagnosis not present

## 2021-08-13 DIAGNOSIS — M79675 Pain in left toe(s): Secondary | ICD-10-CM | POA: Diagnosis not present

## 2021-08-13 DIAGNOSIS — M2042 Other hammer toe(s) (acquired), left foot: Secondary | ICD-10-CM | POA: Diagnosis not present

## 2021-08-13 DIAGNOSIS — M2142 Flat foot [pes planus] (acquired), left foot: Secondary | ICD-10-CM

## 2021-08-13 DIAGNOSIS — M79674 Pain in right toe(s): Secondary | ICD-10-CM | POA: Diagnosis not present

## 2021-08-13 NOTE — Patient Instructions (Signed)
If toe gets worse, call and schedule with Dr. Sherryle Lis for ingrown toenail removal.    EPSOM SALT FOOT SOAK INSTRUCTIONS  *IF YOU HAVE BEEN PRESCRIBED ANTIBIOTICS, TAKE AS INSTRUCTED UNTIL ALL ARE GONE*  Shopping List:  A. Plain epsom salt (not scented) B. prescribed antiobiotic drops C. 1-inch fabric band-aids   Place 1/4 cup of epsom salts in 2 quarts of warm tap water. IF YOU ARE DIABETIC, OR HAVE NEUROPATHY, CHECK THE TEMPERATURE OF THE WATER WITH YOUR ELBOW.   Submerge your foot/feet in the solution and soak for 10-15 minutes.      3.  Next, remove your foot/feet from solution, blot dry the affected area.    4.  Apply light amount of antibiotic cream/ointment and cover with fabric band-aid .  5.  This soak should be done once a day for 3 days.   6.  Monitor for any signs/symptoms of infection such as redness, swelling, odor, drainage, increased pain, or non-healing of digit.   7.  Please do not hesitate to call the office and speak to a Nurse or Doctor if you have questions.   8.  If you experience fever, chills, nightsweats, nausea or vomiting with worsening of digit/foot, please go to the emergency room.

## 2021-08-15 ENCOUNTER — Other Ambulatory Visit: Payer: Self-pay | Admitting: Family Medicine

## 2021-08-15 DIAGNOSIS — Z1231 Encounter for screening mammogram for malignant neoplasm of breast: Secondary | ICD-10-CM

## 2021-08-17 NOTE — Progress Notes (Signed)
ANNUAL DIABETIC FOOT EXAM  Subjective: Rose Miller presents today for annual diabetic foot examination.  Patient confirms h/o diabetes.  Patient relates 3 year h/o diabetes.  Patient denies any h/o foot wounds.  Patient denies any numbness, tingling, burning, or pins/needle sensation in feet.  Last known  HgA1c was 6.6%  Patient did not check blood glucose this morning.  Risk factors: diabetes, HTN, COPD, CKD, hyperlipidemia, hypercholesterolemia, h/o tobacco use in remission.  Rose Maw, MD is patient's PCP. Last visit was Jun 12, 2021.  Past Medical History:  Diagnosis Date   Adhesive capsulitis    Aortic valve disease    Arthritis    Asthma    Back pain    CKD (chronic kidney disease) stage 3, GFR 30-59 ml/min (HCC)    Constipation    COPD (chronic obstructive pulmonary disease) (HCC)    Diabetes mellitus without complication (HCC)    Diuretic-induced hypokalemia    Hypercholesterolemia    Hypertension    IT band syndrome    Morbid obesity (HCC)    Palpitations    Periumbilical pain    Postmenopausal estrogen deficiency    Prediabetes    Ventral hernia    Patient Active Problem List   Diagnosis Date Noted   Need for shingles vaccine 06/12/2021   Pharyngoesophageal dysphagia    Family history of colon cancer in father    Aortic valve disease 12/26/2020   COPD (chronic obstructive pulmonary disease) ([place]) 12/26/2020   S/P TAVR (transcatheter aortic valve replacement) 12/26/2020   Postmenopausal estrogen deficiency 02/18/2019   Former smoker 05/13/2018   Long-term use of aspirin therapy 05/13/2018   Class 3 severe obesity due to excess calories with body mass index (BMI) of 50.0 to 59.9 in adult ([place]) 03/31/2017   DM (diabetes mellitus) ([place]) 03/31/2017   CKD (chronic kidney disease) stage 3, GFR 30-59 ml/min (HCC) 03/31/2017   Hyperlipidemia LDL goal <70 62/22/9798   Periumbilical pain 92/11/9415   IT band syndrome 09/11/2014   Right shoulder  pain 09/11/2014   Ventral hernia 05/01/2014   Essential hypertension 03/17/2014   Past Surgical History:  Procedure Laterality Date   BIOPSY  05/01/2021   Procedure: BIOPSY;  Surgeon: Jackquline Denmark, MD;  Location: [facility];  Service: Gastroenterology;;   [place]   COLONOSCOPY     COLONOSCOPY WITH PROPOFOL N/A 05/01/2021   Procedure: COLONOSCOPY WITH PROPOFOL;  Surgeon: Jackquline Denmark, MD;  Location: [facility];  Service: Gastroenterology;  Laterality: N/A;   ESOPHAGOGASTRODUODENOSCOPY (EGD) WITH PROPOFOL N/A 05/01/2021   Procedure: ESOPHAGOGASTRODUODENOSCOPY (EGD) WITH PROPOFOL;  Surgeon: Jackquline Denmark, MD;  Location: [facility];  Service: Gastroenterology;  Laterality: N/A;   INTRAOPERATIVE TRANSTHORACIC ECHOCARDIOGRAM N/A 12/26/2020   Procedure: INTRAOPERATIVE TRANSTHORACIC ECHOCARDIOGRAM;  Surgeon: Early Osmond, MD;  Location: [place] [facility];  Service: Open Heart Surgery;  Laterality: N/A;   MALONEY DILATION  05/01/2021   Procedure: Venia Minks DILATION;  Surgeon: Jackquline Denmark, MD;  Location: [facility];  Service: Gastroenterology;;   POLYPECTOMY  05/01/2021   Procedure: POLYPECTOMY;  Surgeon: Jackquline Denmark, MD;  [facility]: [facility];  Service: Gastroenterology;;   RIGHT/LEFT HEART CATH AND CORONARY ANGIOGRAPHY N/A 11/24/2020   Procedure: RIGHT/LEFT HEART CATH AND CORONARY ANGIOGRAPHY;  Surgeon: Belva Crome, MD;  Location: [place] [facility];  Service: Cardiovascular;  Laterality: N/A;   TONSILLECTOMY AND ADENOIDECTOMY  1960   TRANSCATHETER AORTIC VALVE REPLACEMENT, TRANSFEMORAL N/A 12/26/2020   Procedure: TRANSCATHETER AORTIC VALVE REPLACEMENT, TRANSFEMORAL;  Surgeon: Early Osmond,  MD;  Location: [place] [facility];  Service: Open Heart Surgery;  Laterality: N/A;   Current Outpatient Medications on File Prior to Visit  Medication Sig Dispense Refill   amLODipine (NORVASC) 10 MG tablet Take 1 tablet (10 mg total) by mouth daily. 90 tablet 3    amoxicillin (AMOXIL) 500 MG tablet Take 4 tablets by mouth 1 hour prior to dental procedures and cleanings 12 tablet 6   apixaban (ELIQUIS) 5 MG TABS tablet Take 5 mg by mouth 2 (two) times daily.     Ascorbic Acid (VITAMIN C) 1000 MG tablet Take 1,000 mg by mouth daily.     atorvastatin (LIPITOR) 10 MG tablet Take 1 tablet (10 mg total) by mouth daily. 90 tablet 3   Bayer Microlet Lancets lancets Use when testing glucose as recommended. (Patient not taking: Reported on 06/12/2021)     Blood Pressure Monitoring (SPHYGMOMANOMETER) MISC 1 Units by Does not apply route daily. 1 each 0   Cholecalciferol 50 MCG (2000 UT) TABS Take 2,000 Units by mouth daily.     Coenzyme Q10 (CO Q-10) 100 MG CAPS Take 100 mg by mouth daily.      Cyanocobalamin (B-12) 500 MCG TABS Take 500 mcg by mouth daily.     diltiazem (CARDIZEM) 30 MG tablet Take 1 tablet (30 mg total) by mouth every 6 (six) hours as needed (fast heart rate, heart palpitations). 14 tablet 0   fluticasone (FLONASE) 50 MCG/ACT nasal spray Place 2 sprays into both nostrils daily.     glucose blood (CONTOUR NEXT TEST) test strip Test up to 4 times per day as recommended (Patient not taking: Reported on 06/12/2021)     hydrALAZINE (APRESOLINE) 50 MG tablet Take 1 tablet (50 mg total) by mouth 3 (three) times daily. 270 tablet 3   Insulin Pen Needle (BD PEN NEEDLE NANO 2ND GEN) 32G X 4 MM MISC 1 Package by Does not apply route 2 (two) times daily. (Patient not taking: Reported on 06/12/2021) 100 each 0   losartan (COZAAR) 100 MG tablet Take 1 tablet (100 mg total) by mouth daily. 90 tablet 3   Menthol, Topical Analgesic, (BLUE-EMU MAXIMUM STRENGTH) 2.5 % LIQD Apply 1 application topically daily as needed (pain).     metoprolol succinate (TOPROL-XL) 25 MG 24 hr tablet Take 1 tablet (25 mg total) by mouth daily. 90 tablet 3   neomycin-polymyxin-hydrocortisone (CORTISPORIN) OTIC solution Apply 1-2 drops to toenail if there is soreness once daily after shower  (Patient taking differently: Apply 1-2 drops to toenail if there is soreness once daily after shower as needed) 10 mL 0   omeprazole (PRILOSEC) 20 MG capsule Take 20 mg by mouth daily.     PEG-KCl-NaCl-NaSulf-Na Asc-C (PLENVU) 140 g SOLR Take 1 kit by mouth as directed. 1 each 0   spironolactone (ALDACTONE) 25 MG tablet Take 0.5 tablets (12.5 mg total) by mouth daily. 45 tablet 3   Zinc 50 MG TABS Take 50 mg by mouth daily.     No current facility-administered medications on file prior to visit.    No Known Allergies Social History   Occupational History   Occupation: retired  Tobacco Use   Smoking status: Former    Types: Cigarettes   Smokeless tobacco: Never  Scientific laboratory technician Use: Never used  Substance and Sexual Activity   Alcohol use: Yes    Comment: socially   Drug use: No   Sexual activity: Not on file   Family History  Problem Relation Age of Onset   Arthritis Mother    Cancer Mother        pancreatic   Hypertension Mother    Obesity Mother    Cancer Father        prostate and colon   Colon cancer Father    Heart disease Father    Thyroid disease Father    Immunization History  Administered Date(s) Administered   Influenza,inj,Quad PF,6+ Mos 12/19/2014   Tdap 12/19/2014, 12/19/2014   Zoster Recombinat (Shingrix) 06/12/2021     Review of Systems: Negative except as noted in the HPI.   Objective: There were no vitals filed for this visit.  Dorrene Bently is a pleasant 66 y.o. female in NAD. AAO X 3.  Vascular Examination: CFT <3 seconds b/l. DP/PT pulses faintly palpable b/l. Skin temperature gradient warm to warm b/l. No ischemia or gangrene. No cyanosis or clubbing noted b/l. Pedal hair sparse.   Neurological Examination: Sensation grossly intact b/l with 10 gram monofilament. Vibratory sensation intact b/l.   Dermatological Examination: Toenails 1-5 b/l thick, discolored, elongated with subungual debris and pain on dorsal palpation.  Pedal skin is  warm and supple b/l LE. No open wounds b/l LE. No interdigital macerations noted b/l LE. Incurvated nailplate medial border left hallux.  Nail border hypertrophy absent. There is tenderness to palpation. Sign(s) of infection: no clinical signs of infection noted on examination today..  Musculoskeletal Examination: Muscle strength 5/5 to b/l LE. Hammertoe deformity noted 2-5 b/l. Pes planus deformity noted bilateral LE.  Radiographs: None  Last A1c:      Latest Ref Rng & Units 06/12/2021   11:47 AM 12/12/2020   10:42 AM  Hemoglobin A1C  Hemoglobin-A1c 4.6 - 6.5 % 6.6  6.1     Footwear Assessment: Does the patient wear appropriate shoes? Yes. Does the patient need inserts/orthotics? Yes.  ADA Risk Categorization: Low Risk :  Patient has all of the following: Intact protective sensation No prior foot ulcer  No severe deformity Pedal pulses present  Assessment: 1. Pain due to onychomycosis of toenails of both feet   2. Ingrown toenail without infection   3. Acquired hammertoes of both feet   4. Pes planus of both feet   5. Diabetes mellitus without complication ([place])   6. Encounter for diabetic foot exam [facility])     Plan: -Patient was evaluated and treated. All patient's and/or POA's questions/concerns answered on today's visit. -Diabetic foot examination performed today. -Continue foot and shoe inspections daily. Monitor blood glucose per PCP/Endocrinologist's recommendations. -Mycotic toenails 1-5 bilaterally were debrided in length and girth with sterile nail nippers and dremel without incident. -Offending nail border debrided and curretaged medial border left hallux utilizing sterile nail nipper and currette. Border(s) cleansed with alcohol and triple antibiotic ointment applied. Dispensed written instructions for once daily epsom salt soaks for 3 days. Call office if there are any concerns. -Patient/POA to call should there be question/concern in the interim. Return in about  3 months (around 11/13/2021).  Marzetta Board, DPM

## 2021-08-18 NOTE — Progress Notes (Signed)
Cardiology Office Note:    Date:  08/22/2021   ID:  Rose Miller, DOB 1955/04/11, MRN [id number]  PCP:  Libby Maw, MD  Cardiologist:  Sinclair Grooms, MD   Referring MD: Flossie Buffy, NP   No chief complaint on file.   History of Present Illness:    Rose Miller is a 66 y.o. female with a hx of  morbid obesity (BMI 49, BSA 2.76), IVCD, pre-diabetes, HTN, HLD, mild COPD, and mixed aortic valve disease with moderate to severe AS/severe AI s/p TAVR (12/26/20)    Feels better when compared to prior to TAVR.  She was noted to be in atrial fibrillation postoperatively.  She had telemetry monitoring done.  Eliquis has been started and will be continued indefinitely.  No recent palpitations.  No bleeding on Eliquis.  Breathing is improved.  Denies orthopnea.  Denies edema.  Past Medical History:  Diagnosis Date   Adhesive capsulitis    Aortic valve disease    Arthritis    Asthma    Back pain    CKD (chronic kidney disease) stage 3, GFR 30-59 ml/min (HCC)    Constipation    COPD (chronic obstructive pulmonary disease) (HCC)    Diabetes mellitus without complication (HCC)    Diuretic-induced hypokalemia    Hypercholesterolemia    Hypertension    IT band syndrome    Morbid obesity (HCC)    Palpitations    Periumbilical pain    Postmenopausal estrogen deficiency    Prediabetes    Ventral hernia     Past Surgical History:  Procedure Laterality Date   BIOPSY  05/01/2021   Procedure: BIOPSY;  Surgeon: Jackquline Denmark, MD;  Location: [facility];  Service: Gastroenterology;;   CESAREAN SECTION  1989   COLONOSCOPY     COLONOSCOPY WITH PROPOFOL N/A 05/01/2021   Procedure: COLONOSCOPY WITH PROPOFOL;  Surgeon: Jackquline Denmark, MD;  [facility];  Service: Gastroenterology;  Laterality: N/A;   ESOPHAGOGASTRODUODENOSCOPY (EGD) WITH PROPOFOL N/A 05/01/2021   Procedure: ESOPHAGOGASTRODUODENOSCOPY (EGD) WITH PROPOFOL;  Surgeon: Jackquline Denmark, MD;  [facility];  Service: Gastroenterology;  Laterality: N/A;   INTRAOPERATIVE TRANSTHORACIC ECHOCARDIOGRAM N/A 12/26/2020   Procedure: INTRAOPERATIVE TRANSTHORACIC ECHOCARDIOGRAM;  Surgeon: Early Osmond, MD;  Location: [place] [facility];  Service: Open Heart Surgery;  Laterality: N/A;   MALONEY DILATION  05/01/2021   Procedure: Venia Minks DILATION;  Surgeon: Jackquline Denmark, MD;  Location: [facility];  Service: Gastroenterology;;   POLYPECTOMY  05/01/2021   Procedure: POLYPECTOMY;  Surgeon: Jackquline Denmark, MD;  [facility];  Service: Gastroenterology;;   RIGHT/LEFT HEART CATH AND CORONARY ANGIOGRAPHY N/A 11/24/2020   Procedure: RIGHT/LEFT HEART CATH AND CORONARY ANGIOGRAPHY;  Surgeon: Belva Crome, MD;  Location: [facility] [facility];  Service: Cardiovascular;  Laterality: N/A;   TONSILLECTOMY AND ADENOIDECTOMY  1960   TRANSCATHETER AORTIC VALVE REPLACEMENT, TRANSFEMORAL N/A 12/26/2020   Procedure: TRANSCATHETER AORTIC VALVE REPLACEMENT, TRANSFEMORAL;  Surgeon: Early Osmond, MD;  Location: [place] [facility];  Service: Open Heart Surgery;  Laterality: N/A;    Current Medications: Current Meds  Medication Sig   amLODipine (NORVASC) 10 MG tablet Take 1 tablet (10 mg total) by mouth daily.   amoxicillin (AMOXIL) 500 MG tablet Take 4 tablets by mouth 1 hour prior to dental procedures and cleanings   apixaban (ELIQUIS) 5 MG TABS tablet Take 5 mg by mouth 2 (two) times daily.   Ascorbic Acid (VITAMIN C) 1000 MG tablet Take 1,000 mg  by mouth daily.   atorvastatin (LIPITOR) 10 MG tablet Take 1 tablet (10 mg total) by mouth daily.   Bayer Microlet Lancets lancets    Blood Pressure Monitoring (SPHYGMOMANOMETER) MISC 1 Units by Does not apply route daily.   Cholecalciferol 50 MCG (2000 UT) TABS Take 2,000 Units by mouth daily.   Coenzyme Q10 (CO Q-10) 100 MG CAPS Take 100 mg by mouth daily.    Cyanocobalamin (B-12) 500 MCG TABS Take 500 mcg by mouth daily.   diltiazem (CARDIZEM) 30 MG  tablet Take 1 tablet (30 mg total) by mouth every 6 (six) hours as needed (fast heart rate, heart palpitations).   fluticasone (FLONASE) 50 MCG/ACT nasal spray Place 2 sprays into both nostrils daily.   glucose blood (CONTOUR NEXT TEST) test strip    hydrALAZINE (APRESOLINE) 50 MG tablet Take 1 tablet (50 mg total) by mouth 3 (three) times daily.   Insulin Pen Needle (BD PEN NEEDLE NANO 2ND GEN) 32G X 4 MM MISC 1 Package by Does not apply route 2 (two) times daily.   losartan (COZAAR) 100 MG tablet Take 1 tablet (100 mg total) by mouth daily.   Menthol, Topical Analgesic, (BLUE-EMU MAXIMUM STRENGTH) 2.5 % LIQD Apply 1 application topically daily as needed (pain).   metoprolol succinate (TOPROL-XL) 25 MG 24 hr tablet Take 1 tablet (25 mg total) by mouth daily.   neomycin-polymyxin-hydrocortisone (CORTISPORIN) OTIC solution Apply 1-2 drops to toenail if there is soreness once daily after shower (Patient taking differently: Apply 1-2 drops to toenail if there is soreness once daily after shower as needed)   omeprazole (PRILOSEC) 20 MG capsule Take 20 mg by mouth daily.   PEG-KCl-NaCl-NaSulf-Na Asc-C (PLENVU) 140 g SOLR Take 1 kit by mouth as directed.   spironolactone (ALDACTONE) 25 MG tablet Take 0.5 tablets (12.5 mg total) by mouth daily.   Zinc 50 MG TABS Take 50 mg by mouth daily.     Allergies:   Patient has no known allergies.   Social History   Socioeconomic History   Marital status: Single    Spouse name: Not on file   Number of children: 1   Years of education: Not on file   Highest education level: Not on file  Occupational History   Occupation: retired  Tobacco Use   Smoking status: Former    Types: Cigarettes   Smokeless tobacco: Never  Vaping Use   Vaping Use: Never used  Substance and Sexual Activity   Alcohol use: Yes    Comment: socially   Drug use: No   Sexual activity: Not on file  Other Topics Concern   Not on file  Social History Narrative   Not on file    Social Determinants of Health   Financial Resource Strain: Low Risk  (03/20/2021)   Overall Financial Resource Strain (CARDIA)    Difficulty of Paying Living Expenses: Not hard at all  Food Insecurity: No Food Insecurity (03/20/2021)   Hunger Vital Sign    Worried About Running Out of Food in the Last Year: Never true    Amber in the Last Year: Never true  Transportation Needs: No Transportation Needs (03/20/2021)   PRAPARE - Hydrologist (Medical): No    Lack of Transportation (Non-Medical): No  Physical Activity: Inactive (03/20/2021)   Exercise Vital Sign    Days of Exercise per Week: 0 days    Minutes of Exercise per Session: 0 min  Stress: No Stress  Concern Present (03/20/2021)   [place]    Feeling of Stress : Not at all  Social Connections: Socially Isolated (03/20/2021)   Social Connection and Isolation Panel [NHANES]    Frequency of Communication with Friends and Family: Twice a week    Frequency of Social Gatherings with Friends and Family: Twice a week    Attends Religious Services: Never    Marine scientist or Organizations: No    Attends Music therapist: Never    Marital Status: Never married     Family History: The patient's family history includes Arthritis in her mother; Cancer in her father and mother; Colon cancer in her father; Heart disease in her father; Hypertension in her mother; Obesity in her mother; Thyroid disease in her father.  ROS:   Please see the history of present illness.    No peripheral edema.  All other systems reviewed and are negative.  EKGs/Labs/Other Studies Reviewed:    The following studies were reviewed today:  ECHOCARDIOGRAPHY 2022: IMPRESSIONS    1. Left ventricular ejection fraction, by estimation, is 60 to 65%. The  left ventricle has normal function. The left ventricle has no regional  wall motion  abnormalities. There is mild left ventricular hypertrophy.  Left ventricular diastolic parameters  were normal. The average left ventricular global longitudinal strain is  -19.5 %. The global longitudinal strain is normal.   2. Right ventricular systolic function is normal. The right ventricular  size is normal.   3. Left atrial size was mildly dilated.   4. The mitral valve is normal in structure. Trivial mitral valve  regurgitation. No evidence of mitral stenosis.   5. Post TAVR with 26 mm Sapien valve Trivial PVL mean gradient 11 peak 22  mmHg AVA 2.8 cm2. The aortic valve has been repaired/replaced. Aortic  valve regurgitation is not visualized. No aortic stenosis is present.  There is a 26 mm Ultra, stented (TAVR)  valve present in the aortic position. Procedure Date: 12/26/20.   6. Aortic dilatation noted. There is mild dilatation of the ascending  aorta, measuring 38 mm.   7. The inferior vena cava is normal in size with greater than 50%  respiratory variability, suggesting right atrial pressure of 3 mmHg.   Comparison(s): 12/27/20 EF 70-75%.   Continuous monitor less than 14 days, January 2023: Study Highlights    Paroxysmal atrial fibrillation with RVR. Burden 4% Otherwise NSR with occational PAC's, PVC's, and SVT. PVC and PAC burden < 1%      EKG:  EKG not repeated.  Clinically, rhythm is stable.  Recent Labs: 12/30/2020: B Natriuretic Peptide 43.6; Magnesium 2.0; TSH 1.311 06/12/2021: ALT 14; BUN 14; Creatinine, Ser 1.25; Hemoglobin 11.7; Platelets 157.0; Potassium 3.7; Sodium 141  Recent Lipid Panel    Component Value Date/Time   CHOL 141 06/12/2021 1147   TRIG 93.0 06/12/2021 1147   HDL 53.70 06/12/2021 1147   CHOLHDL 3 06/12/2021 1147   VLDL 18.6 06/12/2021 1147   LDLCALC 69 06/12/2021 1147    Physical Exam:    VS:  BP (!) 150/78   Pulse 90   Ht _0  (1.778 m)   Wt (!) 363 lb 9.6 oz (164.9 kg)   SpO2 97%   BMI 52.17 kg/m     Wt Readings from  Last 3 Encounters:  08/22/21 (!) 363 lb 9.6 oz (164.9 kg)  06/12/21 (!) 359 lb (162.8 kg)  05/01/21 (!) 337 lb  15.4 oz (153.3 kg)     GEN: Obese. No acute distress HEENT: Normal NECK: No JVD. LYMPHATICS: No lymphadenopathy CARDIAC: 1/6 systolic without diastolic murmur. RRR S4 gallop, or edema. VASCULAR:  Normal Pulses. No bruits. RESPIRATORY:  Clear to auscultation without rales, wheezing or rhonchi  ABDOMEN: Soft, non-tender, non-distended, No pulsatile mass, MUSCULOSKELETAL: No deformity  SKIN: Warm and dry NEUROLOGIC:  Alert and oriented x 3 PSYCHIATRIC:  Normal affect   ASSESSMENT:    1. S/P TAVR (transcatheter aortic valve replacement)   2. PAF (paroxysmal atrial fibrillation) ([place])   3. Essential hypertension   4. Severe concentric left ventricular hypertrophy   5. Chronic anticoagulation    PLAN:    In order of problems listed above:  Clinically improved since TAVR.  Follow-up in TAVR clinic in December.  To follow-up with general cardiology 3 months after that. On Eliquis therapy for paroxysmal atrial fibrillation.  No prolonged episodes. Continue Apresoline, Cozaar, Toprol, diltiazem as needed, Aldactone. Blood pressure and aortic valve disease are likely the cause of LVH.  Amyloidosis if symptoms worsen suggesting diastolic heart failure. Continue Eliquis.  General cardiology follow-up in the first 3 months of 2024.  TAVR clinic follow-up in December.   Medication Adjustments/Labs and Tests Ordered: Current medicines are reviewed at length with the patient today.  Concerns regarding medicines are outlined above.  No orders of the defined types were placed in this encounter.  No orders of the defined types were placed in this encounter.   Patient Instructions  Medication Instructions:  Your physician recommends that you continue on your current medications as directed. Please refer to the Current Medication list given to you today.  *If you need a refill  on your cardiac medications before your next appointment, please call your pharmacy*  Lab Work: NONE  Testing/Procedures: NONE  Follow-Up: At [facility], you and your health needs are our priority.  As part of our continuing mission to provide you with exceptional heart care, we have created designated Provider Care Teams.  These Care Teams include your primary Cardiologist (physician) and Advanced Practice Providers (APPs -  Physician Assistants and Nurse Practitioners) who all work together to provide you with the care you need, when you need it.  Your next appointment:   8-9 month(s)  The format for your next appointment:   In Person  Provider:   Lenna Sciara, MD  Important Information About Sugar         Signed, Sinclair Grooms, MD  08/22/2021 10:29 AM    [place]

## 2021-08-22 ENCOUNTER — Ambulatory Visit: Payer: No Typology Code available for payment source | Admitting: Interventional Cardiology

## 2021-08-22 ENCOUNTER — Encounter (INDEPENDENT_AMBULATORY_CARE_PROVIDER_SITE_OTHER): Payer: Self-pay

## 2021-08-22 ENCOUNTER — Encounter: Payer: Self-pay | Admitting: Interventional Cardiology

## 2021-08-22 VITALS — BP 150/78 | HR 90 | Ht 70.0 in | Wt 363.6 lb

## 2021-08-22 DIAGNOSIS — Z952 Presence of prosthetic heart valve: Secondary | ICD-10-CM

## 2021-08-22 DIAGNOSIS — I480 Paroxysmal atrial fibrillation: Secondary | ICD-10-CM | POA: Diagnosis not present

## 2021-08-22 DIAGNOSIS — Z7901 Long term (current) use of anticoagulants: Secondary | ICD-10-CM | POA: Diagnosis not present

## 2021-08-22 DIAGNOSIS — I10 Essential (primary) hypertension: Secondary | ICD-10-CM

## 2021-08-22 DIAGNOSIS — I517 Cardiomegaly: Secondary | ICD-10-CM

## 2021-08-22 MED ORDER — DILTIAZEM HCL 30 MG PO TABS: 30.0000 mg | ORAL_TABLET | Freq: Four times a day (QID) | ORAL | 6 refills | Status: AC | PRN

## 2021-08-22 NOTE — Patient Instructions (Addendum)
Medication Instructions:  Your physician recommends that you continue on your current medications as directed. Please refer to the Current Medication list given to you today.  *If you need a refill on your cardiac medications before your next appointment, please call your pharmacy*  Lab Work: NONE  Testing/Procedures: NONE  Follow-Up: At [facility], you and your health needs are our priority.  As part of our continuing mission to provide you with exceptional heart care, we have created designated Provider Care Teams.  These Care Teams include your primary Cardiologist (physician) and Advanced Practice Providers (APPs -  Physician Assistants and Nurse Practitioners) who all work together to provide you with the care you need, when you need it.  Your next appointment:   8-9 month(s)  The format for your next appointment:   In Person  Provider:   [name], MD  Important Information About Sugar

## 2021-09-04 ENCOUNTER — Telehealth: Payer: Self-pay

## 2021-09-04 NOTE — Telephone Encounter (Signed)
**Note De-Identified Ilyana Manuele Obfuscation** Unclear who started this Eliquis tier exception for the pt but we received a letter from [facility] stating that they have denied a tier exception for Eliquis. Reason: Per Medicare D Eliquis is already at the lowest tier possible for a name brand medication.  The letter states that they have notified the pt of this denial as well.

## 2021-09-10 ENCOUNTER — Ambulatory Visit: Payer: No Typology Code available for payment source | Admitting: Podiatry

## 2021-09-12 ENCOUNTER — Ambulatory Visit (INDEPENDENT_AMBULATORY_CARE_PROVIDER_SITE_OTHER): Payer: No Typology Code available for payment source | Admitting: Family Medicine

## 2021-09-12 ENCOUNTER — Ambulatory Visit (INDEPENDENT_AMBULATORY_CARE_PROVIDER_SITE_OTHER): Payer: No Typology Code available for payment source

## 2021-09-12 ENCOUNTER — Encounter: Payer: Self-pay | Admitting: Family Medicine

## 2021-09-12 ENCOUNTER — Telehealth: Payer: Self-pay | Admitting: Family Medicine

## 2021-09-12 VITALS — BP 128/70 | HR 72 | Temp 97.1°F | Ht 70.0 in | Wt 365.4 lb

## 2021-09-12 DIAGNOSIS — J309 Allergic rhinitis, unspecified: Secondary | ICD-10-CM | POA: Diagnosis not present

## 2021-09-12 DIAGNOSIS — Z23 Encounter for immunization: Secondary | ICD-10-CM | POA: Diagnosis not present

## 2021-09-12 DIAGNOSIS — J310 Chronic rhinitis: Secondary | ICD-10-CM | POA: Diagnosis not present

## 2021-09-12 DIAGNOSIS — N1832 Chronic kidney disease, stage 3b: Secondary | ICD-10-CM | POA: Diagnosis not present

## 2021-09-12 DIAGNOSIS — H532 Diplopia: Secondary | ICD-10-CM

## 2021-09-12 DIAGNOSIS — I10 Essential (primary) hypertension: Secondary | ICD-10-CM

## 2021-09-12 DIAGNOSIS — Z6841 Body Mass Index (BMI) 40.0 and over, adult: Secondary | ICD-10-CM

## 2021-09-12 DIAGNOSIS — E1165 Type 2 diabetes mellitus with hyperglycemia: Secondary | ICD-10-CM | POA: Diagnosis not present

## 2021-09-12 LAB — BASIC METABOLIC PANEL
BUN: 16 mg/dL (ref 6–23)
CO2: 27 mEq/L (ref 19–32)
Calcium: 9.7 mg/dL (ref 8.4–10.5)
Chloride: 101 mEq/L (ref 96–112)
Creatinine, Ser: 1.39 mg/dL — ABNORMAL HIGH (ref 0.40–1.20)
GFR: 39.21 mL/min — ABNORMAL LOW (ref >60.00)
Glucose, Bld: 137 mg/dL — ABNORMAL HIGH (ref 70–99)
Potassium: 3.8 mEq/L (ref 3.5–5.1)
Sodium: 137 mEq/L (ref 135–145)

## 2021-09-12 LAB — HEMOGLOBIN A1C: Hgb A1c MFr Bld: 7.3 % — ABNORMAL HIGH (ref 4.6–6.5)

## 2021-09-12 NOTE — Progress Notes (Signed)
Established Patient Office Visit  Subjective   Patient ID: Rose Miller, female    DOB: Sep 12, 1955  Age: 66 y.o. MRN: [id number]  Chief Complaint  Patient presents with   Follow-up    3 month follow up, ear pain that come and go,feel lots of pressure in head sometimes, left eye bothersome patted shut in morning, left rib area tender to touch. Patient fasting.     HPI follow-up of type 2 diabetes CKD, hypertension, allergy rhinitis, and severe obesity.  The bariatric clinic where I referred her did not accept her insurance.  She would like to try another place.  Ongoing nasal congestion and rhinorrhea facial pressure.  She reports strict compliance with Flonase.  Does not seem to be helping much.  Rhinorrhea is mostly clear.  Occasional mildly colored postnasal drip.  Denies fever or teeth pain.  Ongoing issue with CKD, pre-/type 2 diabetes.  Refuses treatment.  She has tried Ozempic in the past and did not tolerate it.  She wants to control it with diet only.  She also suffers from CKD.  She does see nephrology yearly for this issue.  Recent eye check.  Ongoing issues with constipation.  MiraLAX helps but does not always remember to use it.  Experiences diplopia once or twice monthly. Status post recent eye check.     Review of Systems  Constitutional:  Negative for chills, diaphoresis, malaise/fatigue and weight loss.  HENT: Negative.    Eyes:  Positive for double vision. Negative for blurred vision.  Cardiovascular:  Negative for chest pain.  Gastrointestinal:  Negative for abdominal pain.  Genitourinary: Negative.   Musculoskeletal:  Negative for falls and myalgias.  Neurological:  Negative for speech change, loss of consciousness and weakness.  Psychiatric/Behavioral: Negative.        Objective:     BP 128/70 (BP Location: Right Arm, Patient Position: Sitting, Cuff Size: Large)   Pulse 72   Temp (!) 97.1 F (36.2 C) (Temporal)   Ht '5\' 10"'$  (1.778 m)   Wt (!) 365 lb 6.4 oz  (165.7 kg)   SpO2 96%   BMI 52.43 kg/m    Physical Exam Constitutional:      General: She is not in acute distress.    Appearance: Normal appearance. She is not ill-appearing, toxic-appearing or diaphoretic.  HENT:     Head: Normocephalic and atraumatic.     Right Ear: External ear normal.     Left Ear: External ear normal.     Mouth/Throat:     Mouth: Mucous membranes are moist.     Pharynx: Oropharynx is clear. No oropharyngeal exudate or posterior oropharyngeal erythema.  Eyes:     General: No scleral icterus.       Right eye: No discharge.        Left eye: No discharge.     Extraocular Movements: Extraocular movements intact.     Conjunctiva/sclera: Conjunctivae normal.     Pupils: Pupils are equal, round, and reactive to light.  Cardiovascular:     Rate and Rhythm: Normal rate and regular rhythm.  Pulmonary:     Effort: Pulmonary effort is normal. No respiratory distress.     Breath sounds: Normal breath sounds.  Abdominal:     General: Bowel sounds are normal.  Musculoskeletal:     Cervical back: No rigidity or tenderness.  Skin:    General: Skin is warm and dry.  Neurological:     Mental Status: She is alert and oriented to person,  place, and time.     Cranial Nerves: No dysarthria or facial asymmetry.  Psychiatric:        Mood and Affect: Mood normal.        Behavior: Behavior normal.      No results found for any visits on 09/12/21.    The 10-year ASCVD risk score (Arnett DK, et al., 2019) is: 17%    Assessment & Plan:   Problem List Items Addressed This Visit       Cardiovascular and Mediastinum   Essential hypertension   Relevant Orders   Basic metabolic panel     Respiratory   Allergic rhinitis   Relevant Orders   Ambulatory referral to Allergy   DG Sinus 1-2 Views     Endocrine   DM (diabetes mellitus) ([place]) - Primary   Relevant Orders   Basic metabolic panel   Hemoglobin A1c     Genitourinary   CKD (chronic kidney disease) stage  3, GFR 30-59 ml/min (HCC)   Relevant Orders   Basic metabolic panel     Other   Class 3 severe obesity due to excess calories with body mass index (BMI) of 50.0 to 59.9 in adult [facility])   Relevant Orders   Amb Referral to Bariatric Surgery   Need for shingles vaccine   Relevant Orders   Varicella-zoster vaccine IM (Completed)   Diplopia   Relevant Orders   Ambulatory referral to Neurology    Return in about 6 months (around 03/14/2022).  Checking sinus films and allergist referral for sinus symptoms.  Continue Flonase.  Neurology referral for episodic diplopia.  Will refer to bariatric clinic in [facility].  Continue follow-up with nephrology yearly for CKD.  Blood pressure controlled on current regimen.  We are at an impasse regarding treatment of her diabetes.  I explained that I have a medication that could help her kidneys and treat the diabetes.  She continues to refuse.  Not sure how to proceed.  Suggested that she may consider finding another provider.  Libby Maw, MD

## 2021-09-12 NOTE — Telephone Encounter (Signed)
Pt is wanting to transfer her care to Dr. Grandville Silos from Dr Ethelene Hal. She thinks this will be a better fit. If this is OK just let me know.

## 2021-09-12 NOTE — Progress Notes (Unsigned)
Initial visit for ongoing nasal congestion and rhinorrhea facial pressure. Occasional mildly colored nasal drip. No fever or tooth pain

## 2021-09-19 ENCOUNTER — Encounter: Payer: Self-pay | Admitting: Family Medicine

## 2021-09-25 NOTE — Progress Notes (Signed)
NEUROLOGY CONSULTATION NOTE  Rose Miller MRN: [id number] DOB: June 16, 1955  Referring provider: Abelino Derrick, MD Primary care provider: Abelino Derrick, MD  Reason for consult:  diplopia  Assessment/Plan:   Diplopia - possibly horizontal - must rule out intracranial/ocular etiology.  She has uncontrolled diabetes but an ischemic 6th nerve palsy less likely as it is intermittent.  Consider thyroid disease vs myasthenia gravis or may simply be due to eye strain.  Check MRI of brain and orbits with and without contrast Check labs:  Thyroid panel (including TSH, T3, T4, TPO antibodies) and myasthenia gravis panel If unremarkable, would refer to ophthalmology.   Subjective:  Rose Miller is a 66 year old female with COPD, DM II, CKD stage 3, HTN, aortic valve disease, arthritis, who presents for diplopia.  History supplemented by referring provider's note.  She started experiencing intermittent double vision several months ago.  It only lasts a minute and occurs when she is focusing on something at a distance.  Does not occur at any particular time of day.  It occurs maybe once or twice a month.  She describes it as possibly horizontal.  No associated headache or eye pain, however she says that her head sometimes feels "funny", particularly at the left temple, but again not painful and occurs for only a minute infrequently.  Denies weakness of neck and extremities.  Sometimes water "doesn't go down right" when she drinks but otherwise denies difficulty swallowing.    She saw an optometrist who didn't find abnormalities and told her maybe she didn't "have enough blood flow to the eye".  She has DM II.  Hgb A1c from 8/30 was 7.3 (increased from 6.6 on 5/30).     PAST MEDICAL HISTORY: Past Medical History:  Diagnosis Date   Adhesive capsulitis    Aortic valve disease    Arthritis    Asthma    Back pain    CKD (chronic kidney disease) stage 3, GFR 30-59 ml/min (HCC)    Constipation     COPD (chronic obstructive pulmonary disease) (HCC)    Diabetes mellitus without complication (HCC)    Diuretic-induced hypokalemia    Hypercholesterolemia    Hypertension    IT band syndrome    Morbid obesity (HCC)    Palpitations    Periumbilical pain    Postmenopausal estrogen deficiency    Prediabetes    Ventral hernia     PAST SURGICAL HISTORY: Past Surgical History:  Procedure Laterality Date   BIOPSY  05/01/2021   Procedure: BIOPSY;  Surgeon: Jackquline Denmark, MD;  Location: [facility];  Service: Gastroenterology;;   CESAREAN SECTION  1989   COLONOSCOPY     COLONOSCOPY WITH PROPOFOL N/A 05/01/2021   Procedure: COLONOSCOPY WITH PROPOFOL;  Surgeon: Jackquline Denmark, MD;  Location: [facility];  Service: Gastroenterology;  Laterality: N/A;   ESOPHAGOGASTRODUODENOSCOPY (EGD) WITH PROPOFOL N/A 05/01/2021   Procedure: ESOPHAGOGASTRODUODENOSCOPY (EGD) WITH PROPOFOL;  Surgeon: Jackquline Denmark, MD;  Location: [facility];  Service: Gastroenterology;  Laterality: N/A;   INTRAOPERATIVE TRANSTHORACIC ECHOCARDIOGRAM N/A 12/26/2020   Procedure: INTRAOPERATIVE TRANSTHORACIC ECHOCARDIOGRAM;  Surgeon: Early Osmond, MD;  Location: [facility];  Service: Open Heart Surgery;  Laterality: N/A;   MALONEY DILATION  05/01/2021   Procedure: Venia Minks DILATION;  Surgeon: Jackquline Denmark, MD;  Location: [facility];  Service: Gastroenterology;;   POLYPECTOMY  05/01/2021   Procedure: POLYPECTOMY;  Surgeon: Jackquline Denmark, MD;  Location: [facility];  Service: Gastroenterology;;   RIGHT/LEFT HEART CATH AND CORONARY ANGIOGRAPHY N/A  11/24/2020   Procedure: RIGHT/LEFT HEART CATH AND CORONARY ANGIOGRAPHY;  Surgeon: Belva Crome, MD;  Location: [facility];  Service: Cardiovascular;  Laterality: N/A;   TONSILLECTOMY AND ADENOIDECTOMY  1960   TRANSCATHETER AORTIC VALVE REPLACEMENT, TRANSFEMORAL N/A 12/26/2020   Procedure: TRANSCATHETER AORTIC VALVE REPLACEMENT, TRANSFEMORAL;  Surgeon: Early Osmond, MD;  Location: [facility];  Service: Open Heart Surgery;  Laterality: N/A;    MEDICATIONS: Current Outpatient Medications on File Prior to Visit  Medication Sig Dispense Refill   amLODipine (NORVASC) 10 MG tablet Take 1 tablet (10 mg total) by mouth daily. 90 tablet 3   amoxicillin (AMOXIL) 500 MG tablet Take 4 tablets by mouth 1 hour prior to dental procedures and cleanings 12 tablet 6   apixaban (ELIQUIS) 5 MG TABS tablet Take 5 mg by mouth 2 (two) times daily.     Ascorbic Acid (VITAMIN C) 1000 MG tablet Take 1,000 mg by mouth daily.     atorvastatin (LIPITOR) 10 MG tablet Take 1 tablet (10 mg total) by mouth daily. 90 tablet 3   Bayer Microlet Lancets lancets      Blood Pressure Monitoring (SPHYGMOMANOMETER) MISC 1 Units by Does not apply route daily. 1 each 0   Cholecalciferol 50 MCG (2000 UT) TABS Take 2,000 Units by mouth daily.     Coenzyme Q10 (CO Q-10) 100 MG CAPS Take 100 mg by mouth daily.      Cyanocobalamin (B-12) 500 MCG TABS Take 500 mcg by mouth daily.     diltiazem (CARDIZEM) 30 MG tablet Take 1 tablet (30 mg total) by mouth every 6 (six) hours as needed (fast heart rate, heart palpitations). 30 tablet 6   fluticasone (FLONASE) 50 MCG/ACT nasal spray Place 2 sprays into both nostrils daily.     glucose blood (CONTOUR NEXT TEST) test strip      hydrALAZINE (APRESOLINE) 50 MG tablet Take 1 tablet (50 mg total) by mouth 3 (three) times daily. 270 tablet 3   Insulin Pen Needle (BD PEN NEEDLE NANO 2ND GEN) 32G X 4 MM MISC 1 Package by Does not apply route 2 (two) times daily. (Patient not taking: Reported on 09/12/2021) 100 each 0   losartan (COZAAR) 100 MG tablet Take 1 tablet (100 mg total) by mouth daily. 90 tablet 3   Menthol, Topical Analgesic, (BLUE-EMU MAXIMUM STRENGTH) 2.5 % LIQD Apply 1 application topically daily as needed (pain).     metoprolol succinate (TOPROL-XL) 25 MG 24 hr tablet Take 1 tablet (25 mg total) by mouth daily. 90 tablet 3    neomycin-polymyxin-hydrocortisone (CORTISPORIN) OTIC solution Apply 1-2 drops to toenail if there is soreness once daily after shower (Patient taking differently: Apply 1-2 drops to toenail if there is soreness once daily after shower as needed) 10 mL 0   omeprazole (PRILOSEC) 20 MG capsule Take 20 mg by mouth daily.     PEG-KCl-NaCl-NaSulf-Na Asc-C (PLENVU) 140 g SOLR Take 1 kit by mouth as directed. 1 each 0   spironolactone (ALDACTONE) 25 MG tablet Take 0.5 tablets (12.5 mg total) by mouth daily. 45 tablet 3   Zinc 50 MG TABS Take 50 mg by mouth daily.     No current facility-administered medications on file prior to visit.    ALLERGIES: No Known Allergies  FAMILY HISTORY: Family History  Problem Relation Age of Onset   Arthritis Mother    Cancer Mother        pancreatic   Hypertension Mother  Obesity Mother    Cancer Father        prostate and colon   Colon cancer Father    Heart disease Father    Thyroid disease Father     Objective:  Blood pressure 128/77, pulse 81, height _0  (1.778 m), weight (!) 364 lb 3.2 oz (165.2 kg), SpO2 96 %. General: No acute distress.  Patient appears well-groomed.   Head:  Normocephalic/atraumatic.  No tenderness to palpation of left temple. Eyes:  fundi examined but not visualized Neck: supple, no paraspinal tenderness, full range of motion Back: No paraspinal tenderness Heart: regular rate and rhythm Lungs: Clear to auscultation bilaterally. Vascular: No carotid bruits. Neurological Exam: Mental status: alert and oriented to person, place, and time, speech fluent and not dysarthric, language intact. Cranial nerves: CN I: not tested CN II: pupils equal, round and reactive to light, visual fields intact CN III, IV, VI:  full range of motion, no nystagmus, no ptosis CN V: facial sensation intact. CN VII: upper and lower face symmetric CN VIII: hearing intact CN IX, X: gag intact, uvula midline CN XI: sternocleidomastoid and  trapezius muscles intact CN XII: tongue midline Bulk & Tone: normal, no fasciculations. Motor:  muscle strength 5/5 throughout Sensation:  Pinprick, temperature and vibratory sensation intact. Deep Tendon Reflexes:  2+ throughout,  toes downgoing.   Finger to nose testing:  Without dysmetria.   Heel to shin:  Without dysmetria.   Gait:  Normal station and stride.  Romberg negative.    Thank you for allowing me to take part in the care of this patient.  Metta Clines, DO  CC: Abelino Derrick, MD

## 2021-09-27 ENCOUNTER — Ambulatory Visit: Payer: No Typology Code available for payment source | Admitting: Neurology

## 2021-09-27 ENCOUNTER — Encounter: Payer: Self-pay | Admitting: Neurology

## 2021-09-27 ENCOUNTER — Other Ambulatory Visit (INDEPENDENT_AMBULATORY_CARE_PROVIDER_SITE_OTHER): Payer: No Typology Code available for payment source

## 2021-09-27 VITALS — BP 128/77 | HR 81 | Ht 70.0 in | Wt 364.2 lb

## 2021-09-27 DIAGNOSIS — H532 Diplopia: Secondary | ICD-10-CM

## 2021-09-27 NOTE — Patient Instructions (Signed)
Check MRI of brain and orbits with and without contrast Check blood work:  thyroid panel, myasthenia gravis panel Further recommendations pending results.

## 2021-09-28 LAB — THYROID PANEL WITH TSH
Free Thyroxine Index: 2.4 (ref 1.4–3.8)
T3 Uptake: 29 % (ref 22–35)
T4, Total: 8.4 ug/dL (ref 5.1–11.9)
TSH: 1.59 mIU/L (ref 0.40–4.50)

## 2021-09-28 LAB — THYROID PEROXIDASE ANTIBODIES (TPO) (REFL): Thyroperoxidase Ab SerPl-aCnc: 2 IU/mL (ref <9)

## 2021-10-09 ENCOUNTER — Ambulatory Visit: Payer: No Typology Code available for payment source | Admitting: Internal Medicine

## 2021-10-09 ENCOUNTER — Encounter: Payer: Self-pay | Admitting: Internal Medicine

## 2021-10-09 ENCOUNTER — Telehealth: Payer: Self-pay | Admitting: Family Medicine

## 2021-10-09 VITALS — BP 128/86 | HR 80 | Temp 98.7°F | Resp 20 | Ht 70.5 in | Wt 362.3 lb

## 2021-10-09 DIAGNOSIS — J4520 Mild intermittent asthma, uncomplicated: Secondary | ICD-10-CM | POA: Diagnosis not present

## 2021-10-09 DIAGNOSIS — E1165 Type 2 diabetes mellitus with hyperglycemia: Secondary | ICD-10-CM

## 2021-10-09 DIAGNOSIS — J3089 Other allergic rhinitis: Secondary | ICD-10-CM | POA: Diagnosis not present

## 2021-10-09 DIAGNOSIS — N1832 Chronic kidney disease, stage 3b: Secondary | ICD-10-CM

## 2021-10-09 DIAGNOSIS — H1013 Acute atopic conjunctivitis, bilateral: Secondary | ICD-10-CM

## 2021-10-09 DIAGNOSIS — J302 Other seasonal allergic rhinitis: Secondary | ICD-10-CM

## 2021-10-09 DIAGNOSIS — H1045 Other chronic allergic conjunctivitis: Secondary | ICD-10-CM

## 2021-10-09 DIAGNOSIS — Z8709 Personal history of other diseases of the respiratory system: Secondary | ICD-10-CM

## 2021-10-09 MED ORDER — FLUTICASONE PROPIONATE 50 MCG/ACT NA SUSP: 2.0000 | Freq: Every day | NASAL | 3 refills | Status: DC

## 2021-10-09 MED ORDER — OLOPATADINE HCL 0.2 % OP SOLN: 1.0000 [drp] | Freq: Two times a day (BID) | OPHTHALMIC | 3 refills | Status: AC

## 2021-10-09 MED ORDER — IPRATROPIUM BROMIDE 0.06 % NA SOLN: 2.0000 | Freq: Four times a day (QID) | NASAL | 12 refills | Status: AC

## 2021-10-09 MED ORDER — MONTELUKAST SODIUM 10 MG PO TABS: 10.0000 mg | ORAL_TABLET | Freq: Every day | ORAL | 6 refills | Status: DC

## 2021-10-09 NOTE — Patient Instructions (Addendum)
Perennial  Rhinitis not well controlled : - allergy testing today was positive to  oak tree, molds, intradermal's were positive to mold mix 1, mold mix 4, cat, dust mite - allergen avoidance as below - consider allergy shots as long term control of your symptoms by teaching your immune system to be more tolerant of your allergy triggers  -Information given today  - Continue Nasal Steroid Spray: Options include Flonase (fluticasone), Nasocort (triamcinolone), Nasonex (mometasome) 1- 2 sprays in each nostril daily (can buy over-the-counter if not covered by insurance)  Best results if used daily. - Start Atrovent (Ipratropium Bromide) 1-2 sprays in each nostril up to 3 times a day as needed for runny nose/post nasal drip/drainage.  Use less frequently if airway gets too dry. - Start Singulair (Montelukast) '10mg'$  nightly. - Start over the counter antihistamine daily or daily as needed.   -Recommend trying Allergra as it has the least side effects   Allergic Conjunctivitis:  - Start Allergy Eye drops: great options include Pataday (Olopatadine) or Zaditor (ketotifen) for eye symptoms daily as needed-both sold over the counter if not covered by insurance.   -Avoid eye drops that say red eye relief as they may contain medications that dry out your eyes.  History of Asthma Diagnosis  -Spirometry was normal today this does not rule out asthma but is suggestive that you have no active inflammation in your lungs -Based on history and current exam I do not think you need any asthma treatment at this time -Continue to monitor for any additional symptoms   Follow up: 4 weeks   Thank you so much for letting me partake in your care today.  Don't hesitate to reach out if you have any additional concerns!  Roney Marion, MD  [facility] [place], [place]  DUST MITE AVOIDANCE MEASURES:  There are three main measures that need and can be taken to avoid house dust mites:  Reduce accumulation  of dust in general -reduce furniture, clothing, carpeting, books, stuffed animals, especially in bedroom  Separate yourself from the dust -use pillow and mattress encasements (can be found at stores such as [employer] or online) -avoid direct exposure to air condition flow -use a HEPA filter device, especially in the bedroom; you can also use a HEPA filter vacuum cleaner -wipe dust with a moist towel instead of a dry towel or broom when cleaning  Decrease mites and/or their secretions -wash clothing and linen and stuffed animals at highest temperature possible, at least every 2 weeks -stuffed animals can also be placed in a bag and put in a freezer overnight  Despite the above measures, it is impossible to eliminate dust mites or their allergen completely from your home.  With the above measures the burden of mites in your home can be diminished, with the goal of minimizing your allergic symptoms.  Success will be reached only when implementing and using all means together.  Reducing Pollen Exposure  The American Academy of Allergy, Asthma and Immunology suggests the following steps to reduce your exposure to pollen during allergy seasons.    Do not hang sheets or clothing out to dry; pollen may collect on these items. Do not mow lawns or spend time around freshly cut grass; mowing stirs up pollen. Keep windows closed at night.  Keep car windows closed while driving. Minimize morning activities outdoors, a time when pollen counts are usually at their highest. Stay indoors as much as possible when pollen counts  or humidity is high and on windy days when pollen tends to remain in the air longer. Use air conditioning when possible.  Many air conditioners have filters that trap the pollen spores. Use a HEPA room air filter to remove pollen form the indoor air you breathe.  Control of Mold Allergen   Mold and fungi can grow on a variety of surfaces provided certain temperature and  moisture conditions exist.  Outdoor molds grow on plants, decaying vegetation and soil.  The major outdoor mold, Alternaria and Cladosporium, are found in very high numbers during hot and dry conditions.  Generally, a late Summer - Fall peak is seen for common outdoor fungal spores.  Rain will temporarily lower outdoor mold spore count, but counts rise rapidly when the rainy period ends.  The most important indoor molds are Aspergillus and Penicillium.  Dark, humid and poorly ventilated basements are ideal sites for mold growth.  The next most common sites of mold growth are the bathroom and the kitchen.  Outdoor (Seasonal) Mold Control  Positive outdoor molds via skin testing: Alternaria, Cladosporium, Drechslera (Curvalaria), and Mucor  Use air conditioning and keep windows closed Avoid exposure to decaying vegetation. Avoid leaf raking. Avoid grain handling. Consider wearing a face mask if working in moldy areas.    Indoor (Perennial) Mold Control   Positive indoor molds via skin testing: Fusarium, Aureobasidium (Pullulara), and Rhizopus  Maintain humidity below 50%. Clean washable surfaces with 5% bleach solution. Remove sources e.g. contaminated carpets.   Control of Dog or Cat Allergen  Avoidance is the best way to manage a dog or cat allergy. If you have a dog or cat and are allergic to dog or cats, consider removing the dog or cat from the home. If you have a dog or cat but don't want to find it a new home, or if your family wants a pet even though someone in the household is allergic, here are some strategies that may help keep symptoms at bay:  Keep the pet out of your bedroom and restrict it to only a few rooms. Be advised that keeping the dog or cat in only one room will not limit the allergens to that room. Don't pet, hug or kiss the dog or cat; if you do, wash your hands with soap and water. High-efficiency particulate air (HEPA) cleaners run continuously in a bedroom or  living room can reduce allergen levels over time. Regular use of a high-efficiency vacuum cleaner or a central vacuum can reduce allergen levels. Giving your dog or cat a bath at least once a week can reduce airborne allergen.

## 2021-10-09 NOTE — Progress Notes (Signed)
New Patient Note  RE: Rose Miller MRN: [id number] DOB: October 03, 1955 Date of Office Visit: 10/09/2021  Consult requested by: Libby Maw,* Primary care provider: Libby Maw, MD  Chief Complaint: Establish Care (Establish care year round allergy's watery eyes, runny nose and head pressure.)  History of Present Illness: I had the pleasure of seeing Rose Miller for initial evaluation at the [facility] [place] on 10/09/2021. She is a 66 y.o. female, who is referred here by Libby Maw, MD for the evaluation of rhinitis .  History obtained from patient, chart review.  Chronic rhinitis: started in 30s  Symptoms include:  throat clearing cough, conjunctival hypermia , nasal congestion, rhinorrhea, post nasal drainage, and watery eyes  Occurs year-round Potential triggers: unknown  Treatments tried: OTC antihistamines rarely  Previous allergy testing: no History of reflux/heartburn: no History of chronic sinusitis or sinus surgery:  Tonisillectomy  Nonallergic triggers:  denies     She has a diagnosis of asthma, but denies history of coughing, recurrent bronchitis, wheezing, dyspnea, prescriptions for oral or inhaled steroids or albuterol.    Assessment and Plan: Rose Miller is a 66 y.o. female with: Seasonal and perennial allergic rhinitis - Plan: Allergy Test, Interdermal Allergy Test  Other chronic allergic conjunctivitis of both eyes - Plan: Allergy Test, Interdermal Allergy Test  History of asthma - Plan: Allergy Test, Interdermal Allergy Test Plan: Patient Instructions  Perennial  Rhinitis not well controlled : - allergy testing today was positive to  oak tree, molds, intradermal's were positive to mold mix 1, mold mix 4, cat, dust mite - allergen avoidance as below - consider allergy shots as long term control of your symptoms by teaching your immune system to be more tolerant of your allergy triggers  -Information given today   - Continue Nasal Steroid Spray: Options include Flonase (fluticasone), Nasocort (triamcinolone), Nasonex (mometasome) 1- 2 sprays in each nostril daily (can buy over-the-counter if not covered by insurance)  Best results if used daily. - Start Atrovent (Ipratropium Bromide) 1-2 sprays in each nostril up to 3 times a day as needed for runny nose/post nasal drip/drainage.  Use less frequently if airway gets too dry. - Start Singulair (Montelukast) 10mg  nightly. - Start over the counter antihistamine daily or daily as needed.   -Recommend trying Allergra as it has the least side effects   Allergic Conjunctivitis:  - Start Allergy Eye drops: great options include Pataday (Olopatadine) or Zaditor (ketotifen) for eye symptoms daily as needed-both sold over the counter if not covered by insurance.   -Avoid eye drops that say red eye relief as they may contain medications that dry out your eyes.  History of Asthma Diagnosis  -Spirometry was normal today this does not rule out asthma but is suggestive that you have no active inflammation in your lungs -Based on history and current exam I do not think you need any asthma treatment at this time -Continue to monitor for any additional symptoms   Follow up: 4 weeks   Thank you so much for letting me partake in your care today.  Don't hesitate to reach out if you have any additional concerns!  Roney Marion, MD  [facility] [place] [place]  DUST MITE AVOIDANCE MEASURES:  There are three main measures that need and can be taken to avoid house dust mites:  Reduce accumulation of dust in general -reduce furniture, clothing, carpeting, books, stuffed animals, especially in bedroom  Separate yourself from the dust -  use pillow and mattress encasements (can be found at stores such as [employer] or online) -avoid direct exposure to air condition flow -use a HEPA filter device, especially in the bedroom; you can also use a HEPA  filter vacuum cleaner -wipe dust with a moist towel instead of a dry towel or broom when cleaning  Decrease mites and/or their secretions -wash clothing and linen and stuffed animals at highest temperature possible, at least every 2 weeks -stuffed animals can also be placed in a bag and put in a freezer overnight  Despite the above measures, it is impossible to eliminate dust mites or their allergen completely from your home.  With the above measures the burden of mites in your home can be diminished, with the goal of minimizing your allergic symptoms.  Success will be reached only when implementing and using all means together.  Reducing Pollen Exposure  The American Academy of Allergy, Asthma and Immunology suggests the following steps to reduce your exposure to pollen during allergy seasons.    Do not hang sheets or clothing out to dry; pollen may collect on these items. Do not mow lawns or spend time around freshly cut grass; mowing stirs up pollen. Keep windows closed at night.  Keep car windows closed while driving. Minimize morning activities outdoors, a time when pollen counts are usually at their highest. Stay indoors as much as possible when pollen counts or humidity is high and on windy days when pollen tends to remain in the air longer. Use air conditioning when possible.  Many air conditioners have filters that trap the pollen spores. Use a HEPA room air filter to remove pollen form the indoor air you breathe.  Control of Mold Allergen   Mold and fungi can grow on a variety of surfaces provided certain temperature and moisture conditions exist.  Outdoor molds grow on plants, decaying vegetation and soil.  The major outdoor mold, Alternaria and Cladosporium, are found in very high numbers during hot and dry conditions.  Generally, a late Summer - Fall peak is seen for common outdoor fungal spores.  Rain will temporarily lower outdoor mold spore count, but counts rise rapidly when  the rainy period ends.  The most important indoor molds are Aspergillus and Penicillium.  Dark, humid and poorly ventilated basements are ideal sites for mold growth.  The next most common sites of mold growth are the bathroom and the kitchen.  Outdoor (Seasonal) Mold Control  Positive outdoor molds via skin testing: Alternaria, Cladosporium, Drechslera (Curvalaria), and Mucor  Use air conditioning and keep windows closed Avoid exposure to decaying vegetation. Avoid leaf raking. Avoid grain handling. Consider wearing a face mask if working in moldy areas.    Indoor (Perennial) Mold Control   Positive indoor molds via skin testing: Fusarium, Aureobasidium (Pullulara), and Rhizopus  Maintain humidity below 50%. Clean washable surfaces with 5% bleach solution. Remove sources e.g. contaminated carpets.   Control of Dog or Cat Allergen  Avoidance is the best way to manage a dog or cat allergy. If you have a dog or cat and are allergic to dog or cats, consider removing the dog or cat from the home. If you have a dog or cat but don't want to find it a new home, or if your family wants a pet even though someone in the household is allergic, here are some strategies that may help keep symptoms at bay:  Keep the pet out of your bedroom and restrict it to  only a few rooms. Be advised that keeping the dog or cat in only one room will not limit the allergens to that room. Don't pet, hug or kiss the dog or cat; if you do, wash your hands with soap and water. High-efficiency particulate air (HEPA) cleaners run continuously in a bedroom or living room can reduce allergen levels over time. Regular use of a high-efficiency vacuum cleaner or a central vacuum can reduce allergen levels. Giving your dog or cat a bath at least once a week can reduce airborne allergen.    Meds ordered this encounter  Medications   ipratropium (ATROVENT) 0.06 % nasal spray    Sig: Place 2 sprays into both nostrils 4  (four) times daily.    Dispense:  15 mL    Refill:  12   montelukast (SINGULAIR) 10 MG tablet    Sig: Take 1 tablet (10 mg total) by mouth at bedtime.    Dispense:  30 tablet    Refill:  6   fluticasone (FLONASE) 50 MCG/ACT nasal spray    Sig: Place 2 sprays into both nostrils daily.    Dispense:  16 g    Refill:  3   Olopatadine HCl 0.2 % SOLN    Sig: Apply 1 drop to eye in the morning and at bedtime.    Dispense:  2.5 mL    Refill:  3   Lab Orders  No laboratory test(s) ordered today    Other allergy screening: Asthma: yes Rhino conjunctivitis: yes Food allergy: no Medication allergy: no Hymenoptera allergy: no Urticaria:  remote history of CSU, has resolved  Eczema:no History of recurrent infections suggestive of immunodeficency: no  Diagnostics: Spirometry:  Tracings reviewed. His effort: Good reproducible efforts. FVC: 3.00 L FEV1: 2.29 L, 84% predicted FEV1/FVC ratio: 76% Interpretation: Spirometry consistent with normal pattern.  Please see scanned spirometry results for details.  Skin Testing: Environmental allergy panel and select foods. Allergy testing today was positive to oak tree, molds, intradermal's were positive to mold mix 1, mold mix 4, cat, dust mite  Results interpreted by myself and discussed with patient/family.  Airborne Adult Perc - 10/09/21 1016     Time Antigen Placed 1016    Allergen Manufacturer Waynette Buttery    Location Back    Number of Test 59    Panel 1 Select    1. Control-Buffer 50% Glycerol Negative    2. Control-Histamine 1 mg/ml 3+    3. Albumin saline Negative    4. Bahia Negative    5. French Southern Territ[place] Negative    6. Johnson Negative    7. Kentucky Blue Negative    8. Meadow Fescue Negative    9. Perennial Rye Negative    10. Sweet Vernal Negative    11. Timothy Negative    12. Cocklebur Negative    13. Burweed Marshelder Negative    14. Ragweed, short Negative    15. Ragweed, Giant Negative    16. Plantain,  English Negative     17. Lamb's Quarters Negative    18. Sheep Sorrell Negative    19. Rough Pigweed Negative    20. Marsh Elder, Rough Negative    21. Mugwort, Common Negative    22. Ash mix Negative    23. Birch mix Negative    24. Beech American Negative    25. Box, Elder Negative    26. Cedar, red Negative    27. Cottonwood, Guinea[place]Bissau Negative    28. Elm mix Negative  29. Hickory Negative    30. Maple mix Negative    31. Oak, [place] mix 2+    32. Pecan Pollen Negative    33. Pine mix Negative    34. Sycamore Eastern Negative    35. Heathcote, Black Pollen Negative    36. Alternaria alternata Negative    37. Cladosporium Herbarum Negative    38. Aspergillus mix Negative    39. Penicillium mix Negative    40. Bipolaris sorokiniana (Helminthosporium) Negative    41. Drechslera spicifera (Curvularia) 2+    42. Mucor plumbeus 2+    43. Fusarium moniliforme Negative    44. Aureobasidium pullulans (pullulara) Negative    45. Rhizopus oryzae Negative    46. Botrytis cinera Negative    47. Epicoccum nigrum Negative    48. Phoma betae Negative    49. Candida Albicans Negative    50. Trichophyton mentagrophytes Negative    51. Mite, D Farinae  5,000 AU/ml Negative    52. Mite, D Pteronyssinus  5,000 AU/ml Negative    53. Cat Hair 10,000 BAU/ml Negative    54.  Dog Epithelia Negative    55. Mixed Feathers Negative    56. Horse Epithelia Negative    57. Cockroach, German Negative    58. Mouse Negative    59. Tobacco Leaf Negative             Food Perc - 10/09/21 1030       Test Information   Time Antigen Placed 1030    Allergen Manufacturer Lavella Hammock    Location Back    Number of allergen test [place]   1. Peanut Negative    2. Soybean food Negative    3. Wheat, whole Negative    4. Sesame Negative    5. Milk, cow Negative    6. Egg White, chicken Negative    7. Casein Negative    8. Shellfish mix Negative    9. Fish mix Negative    10. Cashew Negative              Intradermal - 10/09/21 1131     Time Antigen Placed 1131    Allergen Manufacturer Greer    Location Arm    Number of Test 14    Intradermal Select    Control Negative    Gu[place] Negative    Johnson Negative    7 Grass Negative    Ragweed mix Negative    Weed mix Negative    Tree mix Negative    Mold 1 3+    Mold 2 Negative    Mold 4 3+    Cat 4+    Dog Negative    Cockroach Negative    Mite mix 3+             Past Medical History: Patient Active Problem List   Diagnosis Date Noted   Diplopia 09/12/2021   Allergic rhinitis 09/12/2021   Need for shingles vaccine 06/12/2021   Pharyngoesophageal dysphagia    Family history of colon cancer in father    Aortic valve disease 12/26/2020   COPD (chronic obstructive pulmonary disease) ([place]) 12/26/2020   S/P TAVR (transcatheter aortic valve replacement) 12/26/2020   Postmenopausal estrogen deficiency 02/18/2019   Former smoker 05/13/2018   Long-term use of aspirin therapy 05/13/2018   Class 3 severe obesity due to excess calories with body mass index (BMI) of 50.0 to 59.9 in adult (  [place]) 03/31/2017   DM (diabetes mellitus) ([place]) 03/31/2017   CKD (chronic kidney disease) stage 3, GFR 30-59 ml/min (HCC) 03/31/2017   Hyperlipidemia LDL goal <70 35/32/9924   Periumbilical pain 26/83/4196   IT band syndrome 09/11/2014   Right shoulder pain 09/11/2014   Ventral hernia 05/01/2014   Essential hypertension 03/17/2014   Past Medical History:  Diagnosis Date   Adhesive capsulitis    Aortic valve disease    Arthritis    Asthma    Back pain    CKD (chronic kidney disease) stage 3, GFR 30-59 ml/min (HCC)    Constipation    COPD (chronic obstructive pulmonary disease) (HCC)    Diabetes mellitus without complication (HCC)    Diuretic-induced hypokalemia    Hypercholesterolemia    Hypertension    IT band syndrome    Morbid obesity (HCC)    Palpitations    Periumbilical pain    Postmenopausal estrogen deficiency     Prediabetes    Ventral hernia    Past Surgical History: Past Surgical History:  Procedure Laterality Date   BIOPSY  05/01/2021   Procedure: BIOPSY;  Surgeon: Jackquline Denmark, MD;  Location: [facility];  Service: Gastroenterology;;   CESAREAN SECTION  1989   COLONOSCOPY     COLONOSCOPY WITH PROPOFOL N/A 05/01/2021   Procedure: COLONOSCOPY WITH PROPOFOL;  Surgeon: Jackquline Denmark, MD;  Location: [facility];  Service: Gastroenterology;  Laterality: N/A;   ESOPHAGOGASTRODUODENOSCOPY (EGD) WITH PROPOFOL N/A 05/01/2021   Procedure: ESOPHAGOGASTRODUODENOSCOPY (EGD) WITH PROPOFOL;  Surgeon: Jackquline Denmark, MD;  Location: [facility];  Service: Gastroenterology;  Laterality: N/A;   INTRAOPERATIVE TRANSTHORACIC ECHOCARDIOGRAM N/A 12/26/2020   Procedure: INTRAOPERATIVE TRANSTHORACIC ECHOCARDIOGRAM;  Surgeon: Early Osmond, MD;  Location: [facility];  Service: Open Heart Surgery;  Laterality: N/A;   MALONEY DILATION  05/01/2021   Procedure: Venia Minks DILATION;  Surgeon: Jackquline Denmark, MD;  Location: [facility];  Service: Gastroenterology;;   POLYPECTOMY  05/01/2021   Procedure: POLYPECTOMY;  Surgeon: Jackquline Denmark, MD;  Location: [facility];  Service: Gastroenterology;;   RIGHT/LEFT HEART CATH AND CORONARY ANGIOGRAPHY N/A 11/24/2020   Procedure: RIGHT/LEFT HEART CATH AND CORONARY ANGIOGRAPHY;  Surgeon: Belva Crome, MD;  Location: [facility];  Service: Cardiovascular;  Laterality: N/A;   TONSILLECTOMY AND ADENOIDECTOMY  1960   TRANSCATHETER AORTIC VALVE REPLACEMENT, TRANSFEMORAL N/A 12/26/2020   Procedure: TRANSCATHETER AORTIC VALVE REPLACEMENT, TRANSFEMORAL;  Surgeon: Early Osmond, MD;  Location: [facility];  Service: Open Heart Surgery;  Laterality: N/A;   Medication List:  Current Outpatient Medications  Medication Sig Dispense Refill   amLODipine (NORVASC) 10 MG tablet Take 1 tablet (10 mg total) by mouth daily. 90 tablet 3   amoxicillin (AMOXIL) 500 MG tablet Take  4 tablets by mouth 1 hour prior to dental procedures and cleanings 12 tablet 6   apixaban (ELIQUIS) 5 MG TABS tablet Take 5 mg by mouth 2 (two) times daily.     Ascorbic Acid (VITAMIN C) 1000 MG tablet Take 1,000 mg by mouth daily.     atorvastatin (LIPITOR) 10 MG tablet Take 1 tablet (10 mg total) by mouth daily. 90 tablet 3   Bayer Microlet Lancets lancets      Blood Pressure Monitoring (SPHYGMOMANOMETER) MISC 1 Units by Does not apply route daily. 1 each 0   Cholecalciferol 50 MCG (2000 UT) TABS Take 2,000 Units by mouth daily.     Coenzyme Q10 (CO Q-10) 100 MG CAPS Take 100 mg by mouth  daily.      Cyanocobalamin (B-12) 500 MCG TABS Take 500 mcg by mouth daily.     diltiazem (CARDIZEM) 30 MG tablet Take 1 tablet (30 mg total) by mouth every 6 (six) hours as needed (fast heart rate, heart palpitations). 30 tablet 6   fluticasone (FLONASE) 50 MCG/ACT nasal spray Place 2 sprays into both nostrils daily.     fluticasone (FLONASE) 50 MCG/ACT nasal spray Place 2 sprays into both nostrils daily. 16 g 3   glucose blood (CONTOUR NEXT TEST) test strip      hydrALAZINE (APRESOLINE) 50 MG tablet Take 1 tablet (50 mg total) by mouth 3 (three) times daily. 270 tablet 3   ipratropium (ATROVENT) 0.06 % nasal spray Place 2 sprays into both nostrils 4 (four) times daily. 15 mL 12   losartan (COZAAR) 100 MG tablet Take 1 tablet (100 mg total) by mouth daily. 90 tablet 3   Menthol, Topical Analgesic, (BLUE-EMU MAXIMUM STRENGTH) 2.5 % LIQD Apply 1 application topically daily as needed (pain).     metoprolol succinate (TOPROL-XL) 25 MG 24 hr tablet Take 1 tablet (25 mg total) by mouth daily. 90 tablet 3   montelukast (SINGULAIR) 10 MG tablet Take 1 tablet (10 mg total) by mouth at bedtime. 30 tablet 6   neomycin-polymyxin-hydrocortisone (CORTISPORIN) OTIC solution Apply 1-2 drops to toenail if there is soreness once daily after shower (Patient taking differently: Apply 1-2 drops to toenail if there is soreness once  daily after shower as needed) 10 mL 0   Olopatadine HCl 0.2 % SOLN Apply 1 drop to eye in the morning and at bedtime. 2.5 mL 3   omeprazole (PRILOSEC) 20 MG capsule Take 20 mg by mouth daily.     PEG-KCl-NaCl-NaSulf-Na Asc-C (PLENVU) 140 g SOLR Take 1 kit by mouth as directed. 1 each 0   spironolactone (ALDACTONE) 25 MG tablet Take 0.5 tablets (12.5 mg total) by mouth daily. 45 tablet 3   Zinc 50 MG TABS Take 50 mg by mouth daily.     No current facility-administered medications for this visit.   Allergies: Not on File Social History: Social History   Socioeconomic History   Marital status: Single    Spouse name: Not on file   Number of children: 1   Years of education: Not on file   Highest education level: Not on file  Occupational History   Occupation: retired  Tobacco Use   Smoking status: Former    Types: Cigarettes   Smokeless tobacco: Never  Vaping Use   Vaping Use: Never used  Substance and Sexual Activity   Alcohol use: Yes    Comment: socially   Drug use: No   Sexual activity: Not on file  Other Topics Concern   Not on file  Social History Narrative   Not on file   Social Determinants of Health   Financial Resource Strain: Low Risk  (03/20/2021)   Overall Financial Resource Strain (CARDIA)    Difficulty of Paying Living Expenses: Not hard at all  Food Insecurity: No Food Insecurity (03/20/2021)   Hunger Vital Sign    Worried About Running Out of Food in the Last Year: Never true    [place] in the Last Year: Never true  Transportation Needs: No Transportation Needs (03/20/2021)   PRAPARE - Hydrologist (Medical): No    Lack of Transportation (Non-Medical): No  Physical Activity: Inactive (03/20/2021)   Exercise Vital Sign  Days of Exercise per Week: 0 days    Minutes of Exercise per Session: 0 min  Stress: No Stress Concern Present (03/20/2021)   [place]     Feeling of Stress : Not at all  Social Connections: Socially Isolated (03/20/2021)   Social Connection and Isolation Panel [NHANES]    Frequency of Communication with Friends and Family: Twice a week    Frequency of Social Gatherings with Friends and Family: Twice a week    Attends Religious Services: Never    Marine scientist or Organizations: No    Attends Archivist Meetings: Never    Marital Status: Never married   Lives in a single-family home that is built 20 years ago.  There is no roaches in the house and bed is 2 feet off the floor.  There are no dust mite precautions on better pillows.  She is not exposed to fumes, chemicals or dust.  There is no HEPA filter in the home and home is not near an interstate industrial area. 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   Objective: BP 128/86 (BP Location: Left Arm, Patient Position: Sitting, Cuff Size: Large)   Pulse 80   Temp 98.7 F (37.1 C) (Temporal)   Resp 20   Ht 5' 10.5" (1.791 m)   Wt (!) 362 lb 4.8 oz (164.3 kg)   SpO2 97%   BMI 51.25 kg/m  Body mass index is 51.25 kg/m.  General Appearance:  Alert, cooperative, no distress, appears stated age  Head:  Normocephalic, without obvious abnormality, atraumatic  Eyes:  Conjunctiva injected with epiphora, EOM's intact,   Nose:  Nares normal,  erythematous nasal mucosa with scant green rhinorrhea, no visible anterior polyps, and septum midline  Throat: Lips, tongue normal; teeth and gums normal,  absent tonsils and + cobblestoning  Neck: Supple, symmetrical  Lungs:   clear to auscultation bilaterally, Respirations unlabored, no coughing  Heart:  2 out of 6 systolic murmur on left upper sternal border and regular rate and rhythm, Appears well perfused  Extremities: No edema  Skin: Skin color, texture, turgor normal, no rashes or lesions on visualized portions of skin  Neurologic: No gross deficits   The plan was reviewed with the patient/family, and all questions/concerned were addressed.  It was my pleasure to see Rose Miller today and participate in her care. Please feel free to contact me with any questions or concerns.  Sincerely,  Roney Marion, MD Allergy & Immunology  [facility] office: [phone] [facility] office: [phone]

## 2021-10-09 NOTE — Telephone Encounter (Signed)
Caller Name: Sondra Blixt Call back phone #: [phone]  Reason for Call: Pt walked in asking to speak with cma. She originally declined a medication that was recommended for diabetes and her kidneys. She has now changed her mind and would like to receive this med. Please call her back with how to proceed.

## 2021-10-10 NOTE — Telephone Encounter (Signed)
Spoke with patient who states that she have jad a change of heart and would like to start on treatment for diabetes. Patient can not remember the name of the medication that Dr. Ethelene Hal spoke with her about starting but she would like to start on this and states that she was told it would also help with her kidney's. Okay to send in treatment/medication to pharmacy? Please advice.

## 2021-10-11 ENCOUNTER — Other Ambulatory Visit: Payer: Self-pay | Admitting: Physician Assistant

## 2021-10-11 MED ORDER — EMPAGLIFLOZIN 10 MG PO TABS: 10.0000 mg | ORAL_TABLET | Freq: Every day | ORAL | 2 refills | Status: DC

## 2021-10-11 NOTE — Telephone Encounter (Signed)
Patient aware Rx sent in and will follow up with Dr. Grandville Silos.

## 2021-10-11 NOTE — Addendum Note (Signed)
Addended by: Felipa Emory on: 10/11/2021 09:40 AM   Modules accepted: Orders

## 2021-10-17 ENCOUNTER — Encounter: Payer: Self-pay | Admitting: Podiatry

## 2021-10-17 ENCOUNTER — Ambulatory Visit: Payer: No Typology Code available for payment source | Admitting: Podiatry

## 2021-10-17 DIAGNOSIS — M2042 Other hammer toe(s) (acquired), left foot: Secondary | ICD-10-CM | POA: Diagnosis not present

## 2021-10-17 DIAGNOSIS — E119 Type 2 diabetes mellitus without complications: Secondary | ICD-10-CM | POA: Diagnosis not present

## 2021-10-17 DIAGNOSIS — M2041 Other hammer toe(s) (acquired), right foot: Secondary | ICD-10-CM

## 2021-10-17 DIAGNOSIS — M79674 Pain in right toe(s): Secondary | ICD-10-CM

## 2021-10-17 DIAGNOSIS — M79675 Pain in left toe(s): Secondary | ICD-10-CM | POA: Diagnosis not present

## 2021-10-17 DIAGNOSIS — B351 Tinea unguium: Secondary | ICD-10-CM | POA: Diagnosis not present

## 2021-10-17 DIAGNOSIS — M2142 Flat foot [pes planus] (acquired), left foot: Secondary | ICD-10-CM | POA: Diagnosis not present

## 2021-10-17 DIAGNOSIS — M2141 Flat foot [pes planus] (acquired), right foot: Secondary | ICD-10-CM

## 2021-10-17 DIAGNOSIS — L600 Ingrowing nail: Secondary | ICD-10-CM | POA: Diagnosis not present

## 2021-10-20 ENCOUNTER — Ambulatory Visit
Admission: RE | Admit: 2021-10-20 | Discharge: 2021-10-20 | Disposition: A | Discharge status: Home / Self Care | Payer: No Typology Code available for payment source | Source: Ambulatory Visit | Attending: Neurology | Admitting: Neurology

## 2021-10-20 DIAGNOSIS — H532 Diplopia: Secondary | ICD-10-CM

## 2021-10-20 MED ORDER — GADOPICLENOL 0.5 MMOL/ML IV SOLN
10.0000 mL | Freq: Once | INTRAVENOUS | Status: AC | PRN
  Administered 2021-10-20: 10 mL via INTRAVENOUS

## 2021-10-21 NOTE — Progress Notes (Signed)
  Subjective:  Patient ID: Rose Miller, female    DOB: 11/15/1955,  MRN: [id number]  Rose Miller presents to clinic today for:  Chief Complaint  Patient presents with   Diabetes    Diabetic foot care, A1c- 7.3 BG- doesn't take, Nail trim (patient would like diabetic shoes)   New problem(s): None.   PCP is Libby Maw, MD , and last visit was  September 12, 2021.  Not on File  Review of Systems: Negative except as noted in the HPI.  Objective: No changes noted in today's physical examination.  Rose Miller is a pleasant 66 y.o. female morbidly obese in NAD. AAO x 3.  Vascular Examination: CFT <3 seconds b/l. DP/PT pulses faintly palpable b/l. Skin temperature gradient warm to warm b/l. No ischemia or gangrene. No cyanosis or clubbing noted b/l. Pedal hair sparse.   Neurological Examination: Sensation grossly intact b/l with 10 gram monofilament. Vibratory sensation intact b/l.   Dermatological Examination: Toenails 1-5 b/l thick, discolored, elongated with subungual debris and pain on dorsal palpation.  Pedal skin is warm and supple b/l LE. No open wounds b/l LE. No interdigital macerations noted b/l LE.   Incurvated nailplate medial border left hallux.  Nail border hypertrophy absent. There is tenderness to palpation. Sign(s) of infection: no clinical signs of infection noted on examination today..  Musculoskeletal Examination: Muscle strength 5/5 to b/l LE. Hammertoe deformity noted 2-5 b/l. Pes planus deformity noted bilateral LE.  Radiographs: None  Assessment/Plan: 1. Pain due to onychomycosis of toenails of both feet   2. Ingrown toenail without infection   3. Acquired hammertoes of both feet   4. Pes planus of both feet   5. Diabetes mellitus without complication ([place])     No orders of the defined types were placed in this encounter.   -Consent given for treatment as described below: -Examined patient. -Rx written for one pair extra depth shoes  and 3 pair heat moldable insoles. -Toenails 1-5 b/l were debrided in length and girth with sterile nail nippers and dremel without iatrogenic bleeding.  -Offending nail border debrided and curretaged bilateral great toes utilizing sterile nail nipper and currette. Border(s) cleansed with alcohol and TAO applied. Patient/POA/Caregiver/Facility instructed to apply Neosporin Cream  to bilateral great toes once daily for 7 days. Call office if there are any concerns. -Patient/POA to call should there be question/concern in the interim.   Return in about 9 weeks (around 12/19/2021).  Marzetta Board, DPM

## 2021-10-22 ENCOUNTER — Other Ambulatory Visit: Payer: Self-pay | Admitting: Physician Assistant

## 2021-10-22 DIAGNOSIS — I10 Essential (primary) hypertension: Secondary | ICD-10-CM

## 2021-10-22 DIAGNOSIS — E785 Hyperlipidemia, unspecified: Secondary | ICD-10-CM

## 2021-10-26 ENCOUNTER — Telehealth: Payer: Self-pay

## 2021-10-26 DIAGNOSIS — H532 Diplopia: Secondary | ICD-10-CM

## 2021-10-26 NOTE — Telephone Encounter (Signed)
-----   Message from Pieter Partridge, DO sent at 10/23/2021 11:54 AM EDT ----- MRI shows chronic changes in the brain related to age and history of high blood pressure, diabetes and high cholesterol.  No specific findings to explain double vision.  I would like to refer her to ophthalmology for further evaluation of double vision.

## 2021-10-26 NOTE — Telephone Encounter (Signed)
Patient advised of Results and Referral sent to [facility]

## 2021-10-31 ENCOUNTER — Telehealth: Payer: Self-pay

## 2021-10-31 NOTE — Telephone Encounter (Signed)
Tried calling patient, VM full. Per [facility] Not In Network with patient insurance.

## 2021-11-01 NOTE — Telephone Encounter (Signed)
Patient returned call to Sheena. 

## 2021-11-02 NOTE — Telephone Encounter (Signed)
Patient advised [facility] not in network with her insurance.  Advised patient to call her insurance to see who they are in network with.

## 2021-11-09 ENCOUNTER — Encounter: Payer: Self-pay | Admitting: Internal Medicine

## 2021-11-09 ENCOUNTER — Ambulatory Visit: Payer: No Typology Code available for payment source | Admitting: Internal Medicine

## 2021-11-09 VITALS — BP 118/62 | HR 82 | Resp 16

## 2021-11-09 DIAGNOSIS — H1045 Other chronic allergic conjunctivitis: Secondary | ICD-10-CM | POA: Diagnosis not present

## 2021-11-09 DIAGNOSIS — J3089 Other allergic rhinitis: Secondary | ICD-10-CM

## 2021-11-09 DIAGNOSIS — J302 Other seasonal allergic rhinitis: Secondary | ICD-10-CM

## 2021-11-09 DIAGNOSIS — N1831 Chronic kidney disease, stage 3a: Secondary | ICD-10-CM | POA: Diagnosis not present

## 2021-11-09 DIAGNOSIS — Z8709 Personal history of other diseases of the respiratory system: Secondary | ICD-10-CM | POA: Diagnosis not present

## 2021-11-09 NOTE — Progress Notes (Signed)
Follow Up Note  RE: Rose Miller MRN: [id number] DOB: 04/20/1955 Date of Office Visit: 11/09/2021  Referring provider: Libby Maw,* Primary care provider: Libby Maw, MD  Chief Complaint: Allergies  History of Present Illness: I had the pleasure of seeing Rose Miller for a follow up visit at the [facility] [place] [facility] [place] on 11/09/2021. She is a 66 y.o. female, who is being followed for allergic rhinitis, allergic conjunctivitis, history of asthma. Her previous allergy office visit was on 10/09/2021 with Dr. Edison Pace. Today is a regular follow up visit.  History obtained from patient, chart review.  Seasonal and perennial allergic rhinitis: current therapy: Flonase, Atrovent, Singulair, Allegra,  symptoms improved symptoms include:  Significant improvement in sinus pressure, rhinorrhea, nasal congestion.  She is not tolerating the Pataday eyedrops because they cause a burning sensation. Previous allergy testing: yes History of reflux/heartburn: no Interested in Allergy Immunotherapy: no  History of asthma -Denies any cough, wheeze, chest tightness -Spirometry at last visit was normal   Assessment and Plan: Rose Miller is a 66 y.o. female with: Seasonal and perennial allergic rhinitis  Other chronic allergic conjunctivitis of both eyes  History of asthma Plan: Patient Instructions  Perennial  Rhinitis  - allergy testing 10/09/21:  positive to  oak tree, molds, intradermal's were positive to mold mix 1, mold mix 4, cat, dust mite -Continue allergy avoidance - consider allergy shots as long term control of your symptoms by teaching your immune system to be more tolerant of your allergy triggers - Continue Nasal Steroid Spray: Options include Flonase (fluticasone), Nasocort (triamcinolone), Nasonex (mometasome) 1- 2 sprays in each nostril daily (can buy over-the-counter if not covered by insurance)  Best results if used daily. - Continue Atrovent  (Ipratropium Bromide) 1-2 sprays in each nostril up to 3 times a day as needed for runny nose/post nasal drip/drainage.  Use less frequently if airway gets too dry. - Continue Singulair (Montelukast) 56m nightly. - Continue over the counter antihistamine daily or daily as needed.  Samples of Allegra given   Allergic Conjunctivitis:  -  Decrease  Allergy Eye drops: great options include Pataday (Olopatadine) or Zaditor (ketotifen) for eye symptoms daily to as needed, given your having some side effects  -Avoid eye drops that say red eye relief as they may contain medications that dry out your eyes.  History of Asthma Diagnosis  - Stable, no need for treatment.  -Will update spirometry yearly   Follow up: 12 months   Thank you so much for letting me partake in your care today.  Don't hesitate to reach out if you have any additional concerns!  ERoney Marion MD  [facility] [place]   No follow-ups on file.  No orders of the defined types were placed in this encounter.   Lab Orders  No laboratory test(s) ordered today   Diagnostics: None done   Medication List:  Current Outpatient Medications  Medication Sig Dispense Refill   amLODipine (NORVASC) 10 MG tablet TAKE 1 TABLET DAILY 90 tablet 3   amoxicillin (AMOXIL) 500 MG tablet TAKE 4 TABLETS 1 HOUR PRIORTO DENTAL PROCEDURES AND   CLEANINGS 12 tablet 6   apixaban (ELIQUIS) 5 MG TABS tablet Take 5 mg by mouth 2 (two) times daily.     Ascorbic Acid (VITAMIN C) 1000 MG tablet Take 1,000 mg by mouth daily.     atorvastatin (LIPITOR) 10 MG tablet TAKE 1 TABLET DAILY 90 tablet 3   Bayer Microlet  Lancets lancets      Blood Pressure Monitoring (SPHYGMOMANOMETER) MISC 1 Units by Does not apply route daily. 1 each 0   Cholecalciferol 50 MCG (2000 UT) TABS Take 2,000 Units by mouth daily.     Coenzyme Q10 (CO Q-10) 100 MG CAPS Take 100 mg by mouth daily.      Cyanocobalamin (B-12) 500 MCG TABS Take 500 mcg by  mouth daily.     diltiazem (CARDIZEM) 30 MG tablet Take 1 tablet (30 mg total) by mouth every 6 (six) hours as needed (fast heart rate, heart palpitations). 30 tablet 6   empagliflozin (JARDIANCE) 10 MG TABS tablet Take 1 tablet (10 mg total) by mouth daily before breakfast. 30 tablet 2   fluticasone (FLONASE) 50 MCG/ACT nasal spray Place 2 sprays into both nostrils daily. 16 g 3   glucose blood (CONTOUR NEXT TEST) test strip      hydrALAZINE (APRESOLINE) 50 MG tablet TAKE 1 TABLET 3 TIMES A DAY 270 tablet 3   ipratropium (ATROVENT) 0.06 % nasal spray Place 2 sprays into both nostrils 4 (four) times daily. 15 mL 12   losartan (COZAAR) 100 MG tablet TAKE 1 TABLET DAILY,       DISCONTINUE LOSARTAN /     HYDROCHLOROTHIAZIDE 90 tablet 3   Menthol, Topical Analgesic, (BLUE-EMU MAXIMUM STRENGTH) 2.5 % LIQD Apply 1 application topically daily as needed (pain).     metoprolol succinate (TOPROL-XL) 25 MG 24 hr tablet TAKE 1 TABLET DAILY 90 tablet 3   montelukast (SINGULAIR) 10 MG tablet Take 1 tablet (10 mg total) by mouth at bedtime. 30 tablet 6   neomycin-polymyxin-hydrocortisone (CORTISPORIN) OTIC solution Apply 1-2 drops to toenail if there is soreness once daily after shower (Patient taking differently: Apply 1-2 drops to toenail if there is soreness once daily after shower as needed) 10 mL 0   Olopatadine HCl 0.2 % SOLN Apply 1 drop to eye in the morning and at bedtime. 2.5 mL 3   omeprazole (PRILOSEC) 20 MG capsule Take 20 mg by mouth daily.     PEG-KCl-NaCl-NaSulf-Na Asc-C (PLENVU) 140 g SOLR Take 1 kit by mouth as directed. 1 each 0   spironolactone (ALDACTONE) 25 MG tablet Take 0.5 tablets (12.5 mg total) by mouth daily. 45 tablet 3   Zinc 50 MG TABS Take 50 mg by mouth daily.     No current facility-administered medications for this visit.   Allergies: No Known Allergies I reviewed her past medical history, social history, family history, and environmental history and no significant changes  have been reported from her previous visit.  ROS: All others negative except as noted per HPI.   Objective: BP 118/62   Pulse 82   Resp 16   SpO2 95%  There is no height or weight on file to calculate BMI. General Appearance:  Alert, cooperative, no distress, appears stated age  Head:  Normocephalic, without obvious abnormality, atraumatic  Eyes:  Conjunctiva clear, EOM's intact  Nose: Nares normal, hypertrophic turbinates, no visible anterior polyps, and septum midline  Throat: Lips, tongue normal; teeth and gums normal, normal posterior oropharynx and no tonsillar exudate  Neck: Supple, symmetrical  Lungs:   clear to auscultation bilaterally, Respirations unlabored, no coughing  Heart:  regular rate and rhythm and no murmur, Appears well perfused  Extremities: No edema  Skin: Skin color, texture, turgor normal, no rashes or lesions on visualized portions of skin   Neurologic: No gross deficits   Previous notes and tests were  reviewed. The plan was reviewed with the patient/family, and all questions/concerned were addressed.  It was my pleasure to see Rose Miller today and participate in her care. Please feel free to contact me with any questions or concerns.  Sincerely,  Roney Marion, MD  Allergy & Immunology  [facility] [place] [facility] Office: [phone]

## 2021-11-09 NOTE — Patient Instructions (Addendum)
Perennial  Rhinitis  - allergy testing 10/09/21:  positive to  oak tree, molds, intradermal's were positive to mold mix 1, mold mix 4, cat, dust mite -Continue allergy avoidance - consider allergy shots as long term control of your symptoms by teaching your immune system to be more tolerant of your allergy triggers - Continue Nasal Steroid Spray: Options include Flonase (fluticasone), Nasocort (triamcinolone), Nasonex (mometasome) 1- 2 sprays in each nostril daily (can buy over-the-counter if not covered by insurance)  Best results if used daily. - Continue Atrovent (Ipratropium Bromide) 1-2 sprays in each nostril up to 3 times a day as needed for runny nose/post nasal drip/drainage.  Use less frequently if airway gets too dry. - Continue Singulair (Montelukast) '10mg'$  nightly. - Continue over the counter antihistamine daily or daily as needed.  Samples of Allegra given   Allergic Conjunctivitis:  -  Decrease  Allergy Eye drops: great options include Pataday (Olopatadine) or Zaditor (ketotifen) for eye symptoms daily to as needed, given your having some side effects  -Avoid eye drops that say red eye relief as they may contain medications that dry out your eyes.  History of Asthma Diagnosis  - Stable, no need for treatment.  -Will update spirometry yearly   Follow up: 12 months   Thank you so much for letting me partake in your care today.  Don't hesitate to reach out if you have any additional concerns!  Roney Marion, MD  [facility] [place] [place]

## 2021-12-05 ENCOUNTER — Ambulatory Visit
Admission: RE | Admit: 2021-12-05 | Discharge: 2021-12-05 | Disposition: A | Discharge status: Home / Self Care | Payer: No Typology Code available for payment source | Source: Ambulatory Visit | Attending: Family Medicine | Admitting: Family Medicine

## 2021-12-05 DIAGNOSIS — Z1231 Encounter for screening mammogram for malignant neoplasm of breast: Secondary | ICD-10-CM

## 2021-12-12 ENCOUNTER — Other Ambulatory Visit: Payer: Self-pay | Admitting: Family Medicine

## 2021-12-12 ENCOUNTER — Encounter: Payer: Self-pay | Admitting: Neurology

## 2021-12-12 DIAGNOSIS — N1832 Chronic kidney disease, stage 3b: Secondary | ICD-10-CM

## 2021-12-12 DIAGNOSIS — E1165 Type 2 diabetes mellitus with hyperglycemia: Secondary | ICD-10-CM

## 2021-12-24 ENCOUNTER — Encounter: Payer: Self-pay | Admitting: Podiatry

## 2021-12-24 ENCOUNTER — Ambulatory Visit: Payer: No Typology Code available for payment source | Admitting: Podiatry

## 2021-12-24 VITALS — BP 140/77

## 2021-12-24 DIAGNOSIS — E119 Type 2 diabetes mellitus without complications: Secondary | ICD-10-CM | POA: Diagnosis not present

## 2021-12-24 DIAGNOSIS — M79675 Pain in left toe(s): Secondary | ICD-10-CM | POA: Diagnosis not present

## 2021-12-24 DIAGNOSIS — B351 Tinea unguium: Secondary | ICD-10-CM | POA: Diagnosis not present

## 2021-12-24 DIAGNOSIS — M79674 Pain in right toe(s): Secondary | ICD-10-CM

## 2021-12-24 NOTE — Progress Notes (Signed)
  Subjective:  Patient ID: Rose Miller, female    DOB: 1955-10-30,  MRN: [id number]  MARISKA DAFFIN presents to clinic today for preventative diabetic foot care and painful elongated mycotic toenails 1-5 bilaterally which are tender when wearing enclosed shoe gear. Pain is relieved with periodic professional debridement.  Chief Complaint  Patient presents with   Nail Problem    DFC BS-did not check today A1C-7.2 PCP-Kremer, William  PCP VST-09/2021   New problem(s): None.   PCP is Libby Maw, MD.  No Known Allergies  Review of Systems: Negative except as noted in the HPI.  Objective: No changes noted in today's physical examination. Vitals:   12/24/21 1557  BP: (!) 171/76   Rose Miller is a pleasant 66 y.o. female morbidly obese in NAD. AAO x 3.  Vascular Examination: CFT <3 seconds b/l. DP/PT pulses faintly palpable b/l. Skin temperature gradient warm to warm b/l. No ischemia or gangrene. No cyanosis or clubbing noted b/l. Pedal hair sparse.   Neurological Examination: Sensation grossly intact b/l with 10 gram monofilament. Vibratory sensation intact b/l.   Dermatological Examination: Toenails 1-5 b/l thick, discolored, elongated with subungual debris and pain on dorsal palpation.  Pedal skin is warm and supple b/l LE. No open wounds b/l LE. No interdigital macerations noted b/l LE.   Incurvated nailplate medial border left hallux.  Nail border hypertrophy absent. There is tenderness to palpation. Sign(s) of infection: no clinical signs of infection noted on examination today..  Musculoskeletal Examination: Muscle strength 5/5 to b/l LE. Hammertoe deformity noted 2-5 b/l. Pes planus deformity noted bilateral LE.  Radiographs: None  Assessment/Plan: 1. Pain due to onychomycosis of toenails of both feet   2. Diabetes mellitus without complication ([place])     No orders of the defined types were placed in this encounter.   -Patient was evaluated and  treated. All patient's and/or POA's questions/concerns answered on today's visit. -Continue foot and shoe inspections daily. Monitor blood glucose per PCP/Endocrinologist's recommendations. -Continue supportive shoe gear daily. -Mycotic toenails 1-5 bilaterally were debrided in length and girth with sterile nail nippers and dremel without incident. -Patient/POA to call should there be question/concern in the interim.   Return in about 3 months (around 03/25/2022).  Marzetta Board, DPM

## 2021-12-25 NOTE — Progress Notes (Signed)
[facility] [place]                                     Cardiology Office Note:    Date:  12/26/2021   ID:  Rose Miller, DOB 1955/03/27, MRN [id number]  PCP:  Libby Maw, MD  [facility] Cardiologist:  Sinclair Grooms, MD  / Dr. Ali Lowe & Dr. Cyndia Bent (TAVR) --> Will transfer care to Dr. Sydnee Cabal [facility] Electrophysiologist:  None   Referring MD: Flossie Buffy, NP   1 year s/p TAVR  History of Present Illness:    Rose Miller is a 66 y.o. female with a hx of morbid obesity (BMI 15, IVCD, pre-diabetes, HTN, HLD, mild COPD, and mixed aortic valve disease with moderate to severe AS/severe AI s/p TAVR (12/26/20) who presents to clinic for follow up.   She has been followed by Dr. [name] for her cardiology care.The patient was noted to develop progression of aortic insufficiency and aortic stenosis. Echo 11/15/20 showed EF 70%, and severe aortic valvular disease consisting of moderate to severe aortic stenosis with AVA 0.78cm2, with a mean gradient of 28 mmHg and severe aortic insufficiency. She was referred for cardiac catheterization which demonstrated mild obstructive coronary artery disease, elevated wedge pressure of 24 mmHg with V waves to 40 mmHg and an LVEDP of 32 mmHg.    She was evaluated by the multidisciplinary valve team and underwent successful TAVR with a 26 mm Edwards Sapien 3 Ultra Resilia THV via the TF approach on 12/26/20. Post operative echo showed EF 70%, normally functioning TAVR with a mean gradient of 17.7 mmHg and no PVL. BP was elevated and she was started on a nitro gtt. She was resumed on home medications Losartan 136m daily, Norvasc 132mdaily, and hydralazine 2573mID, which was increased to 44m58mD. Given marked sinus bradycardia after TAVR home Toprol XL was discontinued. She was discharged on a baby aspirin.   She then presented to the ER on 12/30/20 and was found to be in rapid  afib and started on Eliquis and given PRN diltiazem 30 mg PO Q6hr PRN palpitations. At follow up Toprol XL was resumed. 1 month echo showed EF 60%, normally functioning TAVR with a mean gradient of 11 mm hg and trivial PVL. She has done well in follow up with no further admissions.  Today the patient presents to clinic for follow up. No CP or SOB. No LE edema, orthopnea or PND. No dizziness or syncope. No blood in stool or urine. No palpitations. Has not been as active as she would like. Weight up 30 lbs. Tried ozempic but it didn't work. Considering bariatric surgery.   Past Medical History:  Diagnosis Date   Adhesive capsulitis    Aortic valve disease    Arthritis    Asthma    Back pain    CKD (chronic kidney disease) stage 3, GFR 30-59 ml/min (HCC)    Constipation    COPD (chronic obstructive pulmonary disease) (HCC)    Diabetes mellitus without complication (HCC)    Diuretic-induced hypokalemia    Hypercholesterolemia    Hypertension    IT band syndrome    Morbid obesity (HCC)    Palpitations    Periumbilical pain    Postmenopausal estrogen deficiency    Prediabetes    Ventral hernia     Past  Surgical History:  Procedure Laterality Date   BIOPSY  05/01/2021   Procedure: BIOPSY;  Surgeon: Jackquline Denmark, MD;  Location: [facility];  Service: Gastroenterology;;   CESAREAN SECTION  1989   COLONOSCOPY     COLONOSCOPY WITH PROPOFOL N/A 05/01/2021   Procedure: COLONOSCOPY WITH PROPOFOL;  Surgeon: Jackquline Denmark, MD;  Location: [facility];  Service: Gastroenterology;  Laterality: N/A;   ESOPHAGOGASTRODUODENOSCOPY (EGD) WITH PROPOFOL N/A 05/01/2021   Procedure: ESOPHAGOGASTRODUODENOSCOPY (EGD) WITH PROPOFOL;  Surgeon: Jackquline Denmark, MD;  Location: [facility];  Service: Gastroenterology;  Laterality: N/A;   INTRAOPERATIVE TRANSTHORACIC ECHOCARDIOGRAM N/A 12/26/2020   Procedure: INTRAOPERATIVE TRANSTHORACIC ECHOCARDIOGRAM;  Surgeon: Early Osmond, MD;  Location: [facility];   Service: Open Heart Surgery;  Laterality: N/A;   MALONEY DILATION  05/01/2021   Procedure: Venia Minks DILATION;  Surgeon: Jackquline Denmark, MD;  Location: [facility];  Service: Gastroenterology;;   POLYPECTOMY  05/01/2021   Procedure: POLYPECTOMY;  Surgeon: Jackquline Denmark, MD;  Location: [facility];  Service: Gastroenterology;;   RIGHT/LEFT HEART CATH AND CORONARY ANGIOGRAPHY N/A 11/24/2020   Procedure: RIGHT/LEFT HEART CATH AND CORONARY ANGIOGRAPHY;  Surgeon: Belva Crome, MD;  Location: [facility];  Service: Cardiovascular;  Laterality: N/A;   TONSILLECTOMY AND ADENOIDECTOMY  1960   TRANSCATHETER AORTIC VALVE REPLACEMENT, TRANSFEMORAL N/A 12/26/2020   Procedure: TRANSCATHETER AORTIC VALVE REPLACEMENT, TRANSFEMORAL;  Surgeon: Early Osmond, MD;  Location: [facility];  Service: Open Heart Surgery;  Laterality: N/A;    Current Medications: Current Meds  Medication Sig   amLODipine (NORVASC) 10 MG tablet TAKE 1 TABLET DAILY   amoxicillin (AMOXIL) 500 MG tablet TAKE 4 TABLETS 1 HOUR PRIORTO DENTAL PROCEDURES AND   CLEANINGS   apixaban (ELIQUIS) 5 MG TABS tablet Take 5 mg by mouth 2 (two) times daily.   Ascorbic Acid (VITAMIN C) 1000 MG tablet Take 1,000 mg by mouth daily.   atorvastatin (LIPITOR) 10 MG tablet TAKE 1 TABLET DAILY   Bayer Microlet Lancets lancets    Blood Pressure Monitoring (SPHYGMOMANOMETER) MISC 1 Units by Does not apply route daily.   Cholecalciferol 50 MCG (2000 UT) TABS Take 2,000 Units by mouth daily.   Coenzyme Q10 (CO Q-10) 100 MG CAPS Take 100 mg by mouth daily.    Cyanocobalamin (B-12) 500 MCG TABS Take 500 mcg by mouth daily.   diltiazem (CARDIZEM) 30 MG tablet Take 1 tablet (30 mg total) by mouth every 6 (six) hours as needed (fast heart rate, heart palpitations).   empagliflozin (JARDIANCE) 10 MG TABS tablet TAKE 1 TABLET BY MOUTH ONCE DAILY BEFORE BREAKFAST   fluticasone (FLONASE) 50 MCG/ACT nasal spray Place 2 sprays into both nostrils daily.    glucose blood (CONTOUR NEXT TEST) test strip    hydrALAZINE (APRESOLINE) 50 MG tablet TAKE 1 TABLET 3 TIMES A DAY   ipratropium (ATROVENT) 0.06 % nasal spray Place 2 sprays into both nostrils 4 (four) times daily.   losartan (COZAAR) 100 MG tablet TAKE 1 TABLET DAILY,       DISCONTINUE LOSARTAN /     HYDROCHLOROTHIAZIDE   Menthol, Topical Analgesic, (BLUE-EMU MAXIMUM STRENGTH) 2.5 % LIQD Apply 1 application topically daily as needed (pain).   metoprolol succinate (TOPROL-XL) 25 MG 24 hr tablet TAKE 1 TABLET DAILY   montelukast (SINGULAIR) 10 MG tablet Take 1 tablet (10 mg total) by mouth at bedtime.   neomycin-polymyxin-hydrocortisone (CORTISPORIN) OTIC solution Apply 1-2 drops to toenail if there is soreness once daily after shower  Olopatadine HCl 0.2 % SOLN Apply 1 drop to eye in the morning and at bedtime.   omeprazole (PRILOSEC) 20 MG capsule Take 20 mg by mouth daily.   PEG-KCl-NaCl-NaSulf-Na Asc-C (PLENVU) 140 g SOLR Take 1 kit by mouth as directed.   spironolactone (ALDACTONE) 25 MG tablet Take 0.5 tablets (12.5 mg total) by mouth daily.   Zinc 50 MG TABS Take 50 mg by mouth daily.     Allergies:   Patient has no known allergies.   Social History   Socioeconomic History   Marital status: Single    Spouse name: Not on file   Number of children: 1   Years of education: Not on file   Highest education level: Not on file  Occupational History   Occupation: retired  Tobacco Use   Smoking status: Former    Types: Cigarettes   Smokeless tobacco: Never  Vaping Use   Vaping Use: Never used  Substance and Sexual Activity   Alcohol use: Yes    Comment: socially   Drug use: No   Sexual activity: Not on file  Other Topics Concern   Not on file  Social History Narrative   Not on file   Social Determinants of Health   Financial Resource Strain: Low Risk  (03/20/2021)   Overall Financial Resource Strain (CARDIA)    Difficulty of Paying Living Expenses: Not hard at all  Food  Insecurity: No Food Insecurity (03/20/2021)   Hunger Vital Sign    Worried About Running Out of Food in the Last Year: Never true    [place] in the Last Year: Never true  Transportation Needs: No Transportation Needs (03/20/2021)   PRAPARE - Hydrologist (Medical): No    Lack of Transportation (Non-Medical): No  Physical Activity: Inactive (03/20/2021)   Exercise Vital Sign    Days of Exercise per Week: 0 days    Minutes of Exercise per Session: 0 min  Stress: No Stress Concern Present (03/20/2021)   [place]    Feeling of Stress : Not at all  Social Connections: Socially Isolated (03/20/2021)   Social Connection and Isolation Panel [NHANES]    Frequency of Communication with Friends and Family: Twice a week    Frequency of Social Gatherings with Friends and Family: Twice a week    Attends Religious Services: Never    Marine scientist or Organizations: No    Attends Music therapist: Never    Marital Status: Never married     Family History: The patient's family history includes Arthritis in her mother; Cancer in her father and mother; Colon cancer in her father; Dementia in her mother; Heart disease in her father; Hypertension in her mother; Obesity in her mother; Thyroid disease in her father.  ROS:   Please see the history of present illness.    All other systems reviewed and are negative.  EKGs/Labs/Other Studies Reviewed:    The following studies were reviewed today:  TAVR OPERATIVE NOTE     Date of Procedure:                12/26/2020   Preoperative Diagnosis:      Severe Aortic Stenosis    Postoperative Diagnosis:    Same    Procedure:        Transcatheter Aortic Valve Replacement - Percutaneous Right Transfemoral Approach  Edwards Sapien 3 Ultra Resilia THV (size 26 mm, model # 9755RSL, serial # [id number])              Co-Surgeons:                         Gaye Pollack, MD and Lenna Sciara, MD     Anesthesiologist:                  Gennie Alma, MD   Echocardiographer:              Bertrum Sol, MD   Pre-operative Echo Findings: Severe aortic stenosis Normal left ventricular systolic function   Post-operative Echo Findings: No paravalvular leak Normal left ventricular systolic function   ______________________   Echo 12/27/20:  IMPRESSIONS   1. Left ventricular ejection fraction, by estimation, is 70 to 75%. The  left ventricle has hyperdynamic function. The left ventricle has no  regional wall motion abnormalities. There is moderate concentric left  ventricular hypertrophy. Left ventricular  diastolic parameters are consistent with Grade II diastolic dysfunction  (pseudonormalization). Elevated left atrial pressure.   2. Right ventricular systolic function is hyperdynamic. The right  ventricular size is normal. Tricuspid regurgitation signal is inadequate  for assessing PA pressure.   3. Left atrial size was mildly dilated.   4. The mitral valve is normal in structure. No evidence of mitral valve  regurgitation.   5. The aortic valve has been repaired/replaced. Aortic valve  regurgitation is not visualized. There is a 26 mm Sapien prosthetic (TAVR)  valve present in the aortic position. Echo findings are consistent with  normal structure and function of the aortic  valve prosthesis. Aortic valve mean gradient measures 17.7 mmHg. Aortic  valve Vmax measures 2.69 m/s.   6. There is borderline dilatation of the ascending aorta, measuring 37  mm.   7. The inferior vena cava is normal in size with greater than 50%  respiratory variability, suggesting right atrial pressure of 3 mmHg.  ________________________   Elwyn Reach 12/27/20-01/10/21 Study Highlights    Paroxysmal atrial fibrillation with RVR. Burden 4% Otherwise NSR with occational PAC's, PVC's, and SVT. PVC and PAC burden < 1%     Patch Wear Time:  14  days and 0 hours (2022-12-14T12:16:42-0500 to 2022-12-28T12:16:42-0500)   Patient had a min HR of 40 bpm, max HR of 195 bpm, and avg HR of 71 bpm. Predominant underlying rhythm was Sinus Rhythm. 1 run of Ventricular Tachycardia occurred lasting 4 beats with a max rate of 169 bpm (avg 164 bpm). 36 Supraventricular Tachycardia  runs occurred, the run with the fastest interval lasting 4 beats with a max rate of 188 bpm, the longest lasting 11.9 secs with an avg rate of 116 bpm. Atrial Fibrillation occurred (4% burden), ranging from 57-195 bpm (avg of 107 bpm), the longest  lasting 7 hours 13 mins with an avg rate of 100 bpm. Isolated SVEs were rare (<1.0%), SVE Couplets were rare (<1.0%), and SVE Triplets were rare (<1.0%). Isolated VEs were rare (<1.0%, 2949), VE Couplets were rare (<1.0%, 91), and VE Triplets were rare  (<1.0%, 1). Ventricular Bigeminy and Trigeminy were present. Previously Notified: MD notification criteria for First Documentation of Atrial Fibrillation met - report posted prior to notification per account request (CL).  _____________________  Echo 02/02/21 IMPRESSIONS   1. Left ventricular ejection fraction, by estimation, is 60 to 65%. The  left ventricle has normal function. The left ventricle has  no regional  wall motion abnormalities. There is mild left ventricular hypertrophy.  Left ventricular diastolic parameters  were normal. The average left ventricular global longitudinal strain is  -19.5 %. The global longitudinal strain is normal.   2. Right ventricular systolic function is normal. The right ventricular  size is normal.   3. Left atrial size was mildly dilated.   4. The mitral valve is normal in structure. Trivial mitral valve  regurgitation. No evidence of mitral stenosis.   5. Post TAVR with 26 mm Sapien valve Trivial PVL mean gradient 11 peak 22  mmHg AVA 2.8 cm2. The aortic valve has been repaired/replaced. Aortic  valve regurgitation is not visualized. No  aortic stenosis is present.  There is a 26 mm Ultra, stented (TAVR)  valve present in the aortic position. Procedure Date: 12/26/20.   6. Aortic dilatation noted. There is mild dilatation of the ascending  aorta, measuring 38 mm.   7. The inferior vena cava is normal in size with greater than 50%  respiratory variability, suggesting right atrial pressure of 3 mmHg.   Comparison(s): 12/27/20 EF 70-75%.   ________________________  Echo 12/26/21 IMPRESSIONS  1. Left ventricular ejection fraction, by estimation, is 60 to 65%. The left ventricle has normal function. The left ventricle has no regional wall motion abnormalities. There is severe concentric left ventricular hypertrophy. Left ventricular diastolic  parameters are indeterminate. Elevated left ventricular end-diastolic pressure.  2. Right ventricular systolic function is normal. The right ventricular size is normal. Tricuspid regurgitation signal is inadequate for assessing PA pressure.  3. The mitral valve is normal in structure. Mild mitral valve regurgitation. No evidence of mitral stenosis.  4. The aortic valve has been repaired/replaced.     There is a 26 mm Ultra, stented (TAVR) valve present in the aortic position.     Aortic valve regurgitation is not visualized. No aortic stenosis is present. Aortic valve mean gradient measures 12.0 mmHg. Aortic valve peak gradient measures 20.5 mmHg.     DVI 0.57 and VMax 2.6ms.  5. Aortic dilatation noted. There is mild dilatation of the ascending aorta, measuring 40 mm.  6. The inferior vena cava is normal in size with greater than 50% respiratory variability, suggesting right atrial pressure of 3 mmHg.  EKG:  EKG is NOT ordered today.    Recent Labs: 12/30/2020: B Natriuretic Peptide 43.6; Magnesium 2.0 06/12/2021: ALT 14; Hemoglobin 11.7; Platelets 157.0 09/12/2021: BUN 16; Creatinine, Ser 1.39; Potassium 3.8; Sodium 137 09/27/2021: TSH 1.59   Recent Lipid Panel    Component Value  Date/Time   CHOL 141 06/12/2021 1147   TRIG 93.0 06/12/2021 1147   HDL 53.70 06/12/2021 1147   CHOLHDL 3 06/12/2021 1147   VLDL 18.6 06/12/2021 1147   LDLCALC 69 06/12/2021 1147     Risk Assessment/Calculations:    CHA2DS2-VASc Score = 5   This indicates a 7.2% annual risk of stroke. The patient's score is based upon: CHF History: 0 HTN History: 1 Diabetes History: 1 Stroke History: 0 Vascular Disease History: 1 Age Score: 1 Gender Score: 1      Physical Exam:    VS:  BP 130/70   Pulse 84   Ht 5' 10.5" (1.791 m)   Wt (!) 366 lb 12.8 oz (166.4 kg)   SpO2 96%   BMI 51.89 kg/m     Wt Readings from Last 3 Encounters:  12/26/21 (!) 366 lb 12.8 oz (166.4 kg)  10/09/21 (!) 362 lb 4.8 oz (164.3 kg)  09/27/21 (!) 364 lb 3.2 oz (165.2 kg)     GEN:  Well nourished, well developed in no acute distress, obese HEENT: Normal NECK: No JVD LYMPHATICS: No lymphadenopathy CARDIAC: RRR, soft murmur. No rubs, gallops RESPIRATORY:  Clear to auscultation without rales, wheezing or rhonchi  ABDOMEN: Soft, non-tender, non-distended MUSCULOSKELETAL:  No edema; No deformity  SKIN: Warm and dry.  NEUROLOGIC:  Alert and oriented x 3 PSYCHIATRIC:  Normal affect   ASSESSMENT:    1. S/P TAVR (transcatheter aortic valve replacement)   2. Essential hypertension   3. Morbid obesity ([place])   4. PAF (paroxysmal atrial fibrillation) (HCC)    PLAN:    In order of problems listed above:  Severe AS/AI s/p TAVR: echo today shows EF 60-65%, severe LVH, normally functioning TAVR with a mean gradient of 12 mm hg and no PVL. She has NYHA class I symptoms but not very active. She has amoxicillin for SBE prophylaxis. Continue on apixiban 8m BID. She will establish care with Dr. PJohney Framewith Dr. SThompson Caulretirement.   HTN: BP elevated on arrival at 158/80. Personally rechecked and 130/70. She does have severe LVH on echo. Current medications include hydralazine 527mTID, Norvasc 1051maily,  Losartan 100m48mily, Toprol XL 25mg38mly, and Spiro 12.5mg d92my. No changes for now.   Morbid obesity: Body mass index is 51.89 kg/m. Recently failed Ozempic and now considering bariatric surgery. She would be cleared from a cardiac perspective if she decides to pursue this.   PAF: sounds regular on exam today. Continue on Toprol XL and PRN dilt. As well as Elqiuis 5mg BI72m     Medication Adjustments/Labs and Tests Ordered: Current medicines are reviewed at length with the patient today.  Concerns regarding medicines are outlined above.  No orders of the defined types were placed in this encounter.  No orders of the defined types were placed in this encounter.   Patient Instructions  Medication Instructions:  Your physician recommends that you continue on your current medications as directed. Please refer to the Current Medication list given to you today.  *If you need a refill on your cardiac medications before your next appointment, please call your pharmacy*   Lab Work: None If you have labs (blood work) drawn today and your tests are completely normal, you will receive your results only by: MyChartTrentu have MyChart) OR A paper copy in the mail If you have any lab test that is abnormal or we need to change your treatment, we will call you to review the results.   Follow-Up: [facility] your [facility] needs are our [facility].  As part of our continuing mission to provide you with exceptional heart care, we have created designated Provider Care Teams.  These Care Teams include your primary Cardiologist (physician) and Advanced Practice Providers (APPs -  Physician Assistants and Nurse Practitioners) who all work together to provide you with the care you need, when you need it.  Your next appointment:   04/05/2022 at 10:00 AM  The format for your next appointment:   In Person  Provider:   Dr PembertJohney Frametant Information About  [place]         Signed, KathrynAngelena Form 12/26/2021 3:05 PM    [facility]

## 2021-12-26 ENCOUNTER — Ambulatory Visit (HOSPITAL_COMMUNITY): Payer: No Typology Code available for payment source | Attending: Cardiology

## 2021-12-26 ENCOUNTER — Ambulatory Visit (INDEPENDENT_AMBULATORY_CARE_PROVIDER_SITE_OTHER): Payer: No Typology Code available for payment source | Admitting: Physician Assistant

## 2021-12-26 VITALS — BP 130/70 | HR 84 | Ht 70.5 in | Wt 366.8 lb

## 2021-12-26 DIAGNOSIS — J449 Chronic obstructive pulmonary disease, unspecified: Secondary | ICD-10-CM | POA: Diagnosis not present

## 2021-12-26 DIAGNOSIS — Z952 Presence of prosthetic heart valve: Secondary | ICD-10-CM

## 2021-12-26 DIAGNOSIS — I1310 Hypertensive heart and chronic kidney disease without heart failure, with stage 1 through stage 4 chronic kidney disease, or unspecified chronic kidney disease: Secondary | ICD-10-CM | POA: Diagnosis not present

## 2021-12-26 DIAGNOSIS — R002 Palpitations: Secondary | ICD-10-CM | POA: Insufficient documentation

## 2021-12-26 DIAGNOSIS — I081 Rheumatic disorders of both mitral and tricuspid valves: Secondary | ICD-10-CM | POA: Insufficient documentation

## 2021-12-26 DIAGNOSIS — I480 Paroxysmal atrial fibrillation: Secondary | ICD-10-CM | POA: Insufficient documentation

## 2021-12-26 DIAGNOSIS — N189 Chronic kidney disease, unspecified: Secondary | ICD-10-CM | POA: Insufficient documentation

## 2021-12-26 DIAGNOSIS — E1122 Type 2 diabetes mellitus with diabetic chronic kidney disease: Secondary | ICD-10-CM | POA: Insufficient documentation

## 2021-12-26 DIAGNOSIS — Z6841 Body Mass Index (BMI) 40.0 and over, adult: Secondary | ICD-10-CM | POA: Insufficient documentation

## 2021-12-26 DIAGNOSIS — I10 Essential (primary) hypertension: Secondary | ICD-10-CM

## 2021-12-26 LAB — ECHOCARDIOGRAM COMPLETE
AV Mean grad: 12 mmHg
AV Peak grad: 20.5 mmHg
Ao pk vel: 2.27 m/s
Area-P 1/2: 3.68 cm2
S' Lateral: 2.5 cm

## 2021-12-26 NOTE — Patient Instructions (Signed)
Medication Instructions:  Your physician recommends that you continue on your current medications as directed. Please refer to the Current Medication list given to you today.  *If you need a refill on your cardiac medications before your next appointment, please call your pharmacy*   Lab Work: None If you have labs (blood work) drawn today and your tests are completely normal, you will receive your results only by: [place] (if you have MyChart) OR A paper copy in the mail If you have any lab test that is abnormal or we need to change your treatment, we will call you to review the results.   Follow-Up: At [facility], you and your health needs are our priority.  As part of our continuing mission to provide you with exceptional heart care, we have created designated Provider Care Teams.  These Care Teams include your primary Cardiologist (physician) and Advanced Practice Providers (APPs -  Physician Assistants and Nurse Practitioners) who all work together to provide you with the care you need, when you need it.  Your next appointment:   04/05/2022 at 10:00 AM  The format for your next appointment:   In Person  Provider:   Dr Johney Frame  Important Information About [place]

## 2022-01-15 ENCOUNTER — Other Ambulatory Visit (HOSPITAL_COMMUNITY): Payer: Self-pay

## 2022-01-16 DIAGNOSIS — N1831 Chronic kidney disease, stage 3a: Secondary | ICD-10-CM | POA: Diagnosis not present

## 2022-01-24 DIAGNOSIS — E1122 Type 2 diabetes mellitus with diabetic chronic kidney disease: Secondary | ICD-10-CM | POA: Diagnosis not present

## 2022-01-24 DIAGNOSIS — I129 Hypertensive chronic kidney disease with stage 1 through stage 4 chronic kidney disease, or unspecified chronic kidney disease: Secondary | ICD-10-CM | POA: Diagnosis not present

## 2022-01-24 DIAGNOSIS — E559 Vitamin D deficiency, unspecified: Secondary | ICD-10-CM | POA: Diagnosis not present

## 2022-01-24 DIAGNOSIS — I350 Nonrheumatic aortic (valve) stenosis: Secondary | ICD-10-CM | POA: Diagnosis not present

## 2022-01-24 DIAGNOSIS — N1831 Chronic kidney disease, stage 3a: Secondary | ICD-10-CM | POA: Diagnosis not present

## 2022-01-25 ENCOUNTER — Other Ambulatory Visit (HOSPITAL_COMMUNITY): Payer: Self-pay

## 2022-01-30 ENCOUNTER — Other Ambulatory Visit (HOSPITAL_COMMUNITY): Payer: Self-pay

## 2022-01-30 ENCOUNTER — Encounter: Payer: Self-pay | Admitting: Pharmacist

## 2022-01-30 ENCOUNTER — Telehealth: Payer: Self-pay | Admitting: Family Medicine

## 2022-01-30 ENCOUNTER — Other Ambulatory Visit: Payer: Self-pay

## 2022-01-30 DIAGNOSIS — E1165 Type 2 diabetes mellitus with hyperglycemia: Secondary | ICD-10-CM

## 2022-01-30 DIAGNOSIS — N1832 Chronic kidney disease, stage 3b: Secondary | ICD-10-CM

## 2022-01-30 MED ORDER — EMPAGLIFLOZIN 10 MG PO TABS
10.0000 mg | ORAL_TABLET | Freq: Every day | ORAL | 0 refills | Status: DC
  Filled 2022-01-30: qty 90, 90d supply, fill #0

## 2022-01-30 NOTE — Telephone Encounter (Signed)
Caller Name: pt  Call back phone #: Phone: [phone]    MEDICATION(S):  empagliflozin (JARDIANCE) 10 MG TABS tablet [[id number]], she would like a 90 day supply  Days of Med Remaining:   Has the patient contacted their pharmacy (YES/NO)? Yes contact your pcp   Preferred Pharmacy:  She said [facility]

## 2022-01-31 ENCOUNTER — Other Ambulatory Visit (HOSPITAL_COMMUNITY): Payer: Self-pay

## 2022-03-01 ENCOUNTER — Ambulatory Visit (INDEPENDENT_AMBULATORY_CARE_PROVIDER_SITE_OTHER): Payer: HMO | Admitting: Podiatry

## 2022-03-01 ENCOUNTER — Ambulatory Visit: Payer: No Typology Code available for payment source | Admitting: Podiatry

## 2022-03-01 ENCOUNTER — Encounter: Payer: Self-pay | Admitting: Podiatry

## 2022-03-01 VITALS — BP 180/94

## 2022-03-01 DIAGNOSIS — E119 Type 2 diabetes mellitus without complications: Secondary | ICD-10-CM | POA: Diagnosis not present

## 2022-03-01 DIAGNOSIS — M79675 Pain in left toe(s): Secondary | ICD-10-CM

## 2022-03-01 DIAGNOSIS — B351 Tinea unguium: Secondary | ICD-10-CM | POA: Diagnosis not present

## 2022-03-01 DIAGNOSIS — M79674 Pain in right toe(s): Secondary | ICD-10-CM | POA: Diagnosis not present

## 2022-03-01 NOTE — Progress Notes (Signed)
  Subjective:  Patient ID: Rose Miller, female    DOB: 01/07/1956,  MRN: [id number]  Rose Miller presents to clinic today for preventative diabetic foot care and painful thick toenails that are difficult to trim. Pain interferes with ambulation. Aggravating factors include wearing enclosed shoe gear. Pain is relieved with periodic professional debridement.  Chief Complaint  Patient presents with   Nail Problem    DFC BS-did not check today A1C-7.2 PCP-[facility] PCP VST-12/2022   New problem(s): None.   PCP is Mliss Sax, MD.  No Known Allergies  Review of Systems: Negative except as noted in the HPI.  Objective: No changes noted in today's physical examination. Vitals:   03/01/22 1117 03/01/22 1127  BP: (!) 182/87 (!) 180/94   Rose Miller is a pleasant 67 y.o. female morbidly obese in NAD. AAO x 3.  Vascular Examination: CFT <3 seconds b/l. DP/PT pulses faintly palpable b/l. Skin temperature gradient warm to warm b/l. No ischemia or gangrene. No cyanosis or clubbing noted b/l. Pedal hair sparse.   Neurological Examination: Sensation grossly intact b/l with 10 gram monofilament. Vibratory sensation intact b/l.   Dermatological Examination: Toenails 1-5 b/l thick, discolored, elongated with subungual debris and pain on dorsal palpation.  Pedal skin is warm and supple b/l LE. No open wounds b/l LE. No interdigital macerations noted b/l LE.   Incurvated nailplate medial border left hallux.  Nail border hypertrophy absent. There is tenderness to palpation. Sign(s) of infection: no clinical signs of infection noted on examination today..  Musculoskeletal Examination: Muscle strength 5/5 to b/l LE. Hammertoe deformity noted 2-5 b/l. Pes planus deformity noted bilateral LE.  Radiographs: None  Assessment/Plan: 1. Pain due to onychomycosis of toenails of both feet   2. Diabetes mellitus without complication (HCC)     -Patient was evaluated and treated.  All patient's and/or POA's questions/concerns answered on today's visit. -Discussed elevated blood pressure reading with patient. Patient advised to take blood pressure medication when they get home. Patient to repeat blood pressure at home and contact PCP/Cardiologist if it remains elevated. Patient/Family/Caregiver/POA related understanding. -Continue supportive shoe gear daily. -Toenails were debrided in length and girth 2-5 bilaterally with sterile nail nippers and dremel without iatrogenic bleeding.  -Discussed chronicity of ingrown toenail(s) of bilateral great toes. Recommended patient consider having matrixectomy performed to alleviate chronic ingrown toenail(s). Discussed in-office procedure and post-procedure instructions. Patient states they will think about it. -No invasive procedure(s) performed. Offending nail border debrided and curretaged bilateral great toes utilizing sterile nail nipper and currette. Border(s) cleansed with alcohol and triple antibiotic ointment applied. Patient/POA/Caregiver/Facility instructed to apply Neosporin Cream  to bilateral great toes once daily for 7 days. Call office if there are any concerns. -Patient/POA to call should there be question/concern in the interim.   Return in about 9 weeks (around 05/03/2022).  Freddie Breech, DPM

## 2022-03-04 ENCOUNTER — Telehealth: Payer: Self-pay | Admitting: Physician Assistant

## 2022-03-04 DIAGNOSIS — H2513 Age-related nuclear cataract, bilateral: Secondary | ICD-10-CM | POA: Diagnosis not present

## 2022-03-04 DIAGNOSIS — H524 Presbyopia: Secondary | ICD-10-CM | POA: Diagnosis not present

## 2022-03-04 DIAGNOSIS — E119 Type 2 diabetes mellitus without complications: Secondary | ICD-10-CM | POA: Diagnosis not present

## 2022-03-04 DIAGNOSIS — I480 Paroxysmal atrial fibrillation: Secondary | ICD-10-CM

## 2022-03-04 LAB — HM DIABETES EYE EXAM

## 2022-03-04 NOTE — Telephone Encounter (Signed)
*  STAT* If patient is at the pharmacy, call can be transferred to refill team.   1. Which medications need to be refilled? (please list name of each medication and dose if known)    apixaban (ELIQUIS) 5 MG TABS tablet  2. Which pharmacy/location (including street and city if local pharmacy) is medication to be sent to?  [facility]   3. Do they need a 30 day or 90 day supply?   90 day supply

## 2022-03-05 ENCOUNTER — Other Ambulatory Visit (HOSPITAL_COMMUNITY): Payer: Self-pay

## 2022-03-05 MED ORDER — APIXABAN 5 MG PO TABS
5.0000 mg | ORAL_TABLET | Freq: Two times a day (BID) | ORAL | 1 refills | Status: DC
  Filled 2022-03-05 (×2): qty 180, 90d supply, fill #0
  Filled 2022-06-03: qty 180, 90d supply, fill #1

## 2022-03-05 NOTE — Telephone Encounter (Signed)
Prescription refill request for Eliquis received. Indication: PAF Last office visit: 12/26/21 Scr: 1.39 09/12/21 Age: 67 Weight: 166kg

## 2022-03-14 ENCOUNTER — Ambulatory Visit (INDEPENDENT_AMBULATORY_CARE_PROVIDER_SITE_OTHER): Payer: HMO | Admitting: Family Medicine

## 2022-03-14 ENCOUNTER — Encounter: Payer: Self-pay | Admitting: Family Medicine

## 2022-03-14 VITALS — BP 134/86 | HR 70 | Temp 97.9°F | Wt 360.0 lb

## 2022-03-14 DIAGNOSIS — E1169 Type 2 diabetes mellitus with other specified complication: Secondary | ICD-10-CM | POA: Diagnosis not present

## 2022-03-14 DIAGNOSIS — N183 Chronic kidney disease, stage 3 unspecified: Secondary | ICD-10-CM | POA: Diagnosis not present

## 2022-03-14 DIAGNOSIS — J449 Chronic obstructive pulmonary disease, unspecified: Secondary | ICD-10-CM | POA: Diagnosis not present

## 2022-03-14 DIAGNOSIS — N1832 Chronic kidney disease, stage 3b: Secondary | ICD-10-CM

## 2022-03-14 DIAGNOSIS — E1122 Type 2 diabetes mellitus with diabetic chronic kidney disease: Secondary | ICD-10-CM

## 2022-03-14 DIAGNOSIS — E1165 Type 2 diabetes mellitus with hyperglycemia: Secondary | ICD-10-CM

## 2022-03-14 DIAGNOSIS — I152 Hypertension secondary to endocrine disorders: Secondary | ICD-10-CM

## 2022-03-14 DIAGNOSIS — E1159 Type 2 diabetes mellitus with other circulatory complications: Secondary | ICD-10-CM | POA: Diagnosis not present

## 2022-03-14 DIAGNOSIS — H9202 Otalgia, left ear: Secondary | ICD-10-CM

## 2022-03-14 DIAGNOSIS — B351 Tinea unguium: Secondary | ICD-10-CM

## 2022-03-14 DIAGNOSIS — E669 Obesity, unspecified: Secondary | ICD-10-CM

## 2022-03-14 DIAGNOSIS — Z6841 Body Mass Index (BMI) 40.0 and over, adult: Secondary | ICD-10-CM

## 2022-03-14 DIAGNOSIS — H532 Diplopia: Secondary | ICD-10-CM | POA: Diagnosis not present

## 2022-03-14 DIAGNOSIS — I480 Paroxysmal atrial fibrillation: Secondary | ICD-10-CM | POA: Diagnosis not present

## 2022-03-14 DIAGNOSIS — I739 Peripheral vascular disease, unspecified: Secondary | ICD-10-CM

## 2022-03-14 DIAGNOSIS — I517 Cardiomegaly: Secondary | ICD-10-CM | POA: Diagnosis not present

## 2022-03-14 DIAGNOSIS — E66813 Obesity, class 3: Secondary | ICD-10-CM

## 2022-03-14 LAB — URINALYSIS, ROUTINE W REFLEX MICROSCOPIC
Bilirubin Urine: NEGATIVE
Hgb urine dipstick: NEGATIVE
Ketones, ur: NEGATIVE
Leukocytes,Ua: NEGATIVE
Nitrite: NEGATIVE
Specific Gravity, Urine: 1.015 (ref 1.000–1.030)
Total Protein, Urine: NEGATIVE
Urine Glucose: >=1000 — AB
Urobilinogen, UA: 0.2 (ref 0.0–1.0)
pH: 6 (ref 5.0–8.0)

## 2022-03-14 LAB — POCT GLYCOSYLATED HEMOGLOBIN (HGB A1C): Hemoglobin A1C: 6.2 % — AB (ref 4.0–5.6)

## 2022-03-14 LAB — CBC WITH DIFFERENTIAL/PLATELET
Basophils Absolute: 0.1 10*3/uL (ref 0.0–0.1)
Basophils Relative: 1.1 % (ref 0.0–3.0)
Eosinophils Absolute: 0.1 10*3/uL (ref 0.0–0.7)
Eosinophils Relative: 1.4 % (ref 0.0–5.0)
HCT: 40.1 % (ref 36.0–46.0)
Hemoglobin: 12.8 g/dL (ref 12.0–15.0)
Lymphocytes Relative: 45.5 % (ref 12.0–46.0)
Lymphs Abs: 3.2 10*3/uL (ref 0.7–4.0)
MCHC: 31.8 g/dL (ref 30.0–36.0)
MCV: 79.3 fl (ref 78.0–100.0)
Monocytes Absolute: 0.7 10*3/uL (ref 0.1–1.0)
Monocytes Relative: 9.5 % (ref 3.0–12.0)
Neutro Abs: 3 10*3/uL (ref 1.4–7.7)
Neutrophils Relative %: 42.5 % — ABNORMAL LOW (ref 43.0–77.0)
Platelets: 205 10*3/uL (ref 150.0–400.0)
RBC: 5.06 Mil/uL (ref 3.87–5.11)
RDW: 18.6 % — ABNORMAL HIGH (ref 11.5–15.5)
WBC: 7 10*3/uL (ref 4.0–10.5)

## 2022-03-14 LAB — COMPREHENSIVE METABOLIC PANEL
ALT: 21 U/L (ref 0–35)
AST: 19 U/L (ref 0–37)
Albumin: 4.2 g/dL (ref 3.5–5.2)
Alkaline Phosphatase: 95 U/L (ref 39–117)
BUN: 17 mg/dL (ref 6–23)
CO2: 28 mEq/L (ref 19–32)
Calcium: 10 mg/dL (ref 8.4–10.5)
Chloride: 105 mEq/L (ref 96–112)
Creatinine, Ser: 1.25 mg/dL — ABNORMAL HIGH (ref 0.40–1.20)
GFR: 44.38 mL/min — ABNORMAL LOW (ref >60.00)
Glucose, Bld: 98 mg/dL (ref 70–99)
Potassium: 4 mEq/L (ref 3.5–5.1)
Sodium: 143 mEq/L (ref 135–145)
Total Bilirubin: 0.5 mg/dL (ref 0.2–1.2)
Total Protein: 7.7 g/dL (ref 6.0–8.3)

## 2022-03-14 LAB — MICROALBUMIN / CREATININE URINE RATIO
Creatinine,U: 189.7 mg/dL
Microalb Creat Ratio: 1.7 mg/g (ref 0.0–30.0)
Microalb, Ur: 3.2 mg/dL — ABNORMAL HIGH (ref 0.0–1.9)

## 2022-03-14 LAB — LIPID PANEL
Cholesterol: 153 mg/dL (ref 0–200)
HDL: 52.7 mg/dL (ref >39.00)
LDL Cholesterol: 72 mg/dL (ref 0–99)
NonHDL: 99.82
Total CHOL/HDL Ratio: 3
Triglycerides: 140 mg/dL (ref 0.0–149.0)
VLDL: 28 mg/dL (ref 0.0–40.0)

## 2022-03-14 MED ORDER — RYBELSUS 3 MG PO TABS: 3.0000 mg | ORAL_TABLET | Freq: Every day | ORAL | 0 refills | Status: DC

## 2022-03-14 MED ORDER — CIPROFLOXACIN HCL 0.3 % OP SOLN: 5.0000 [drp] | Freq: Two times a day (BID) | OPHTHALMIC | 0 refills | Status: AC

## 2022-03-14 NOTE — Patient Instructions (Signed)
We are getting labs as discussed.  For diabetes, trial low-dose Rybelsus.  We are referring to pharmacy to for prescription assistance.   We have put in referrals as discussed.  For ears, you may try the eardrops.  If no improvement please follow-up sooner.  Otherwise follow-up 3 months

## 2022-03-14 NOTE — Progress Notes (Signed)
Assessment/Plan:   Problem List Items Addressed This Visit       Cardiovascular and Mediastinum   Hypertension associated with diabetes ([name])    Patient's blood pressure slightly elevated but within acceptable range considering her chronic conditions.   Plan:  Encourage routine monitoring of blood pressure at home. Continue current antihypertensive medications. Review sodium intake and discuss lifestyle modifications to support blood pressure control.      Relevant Medications   Semaglutide (RYBELSUS) 3 MG TABS   PAF (paroxysmal atrial fibrillation) (HCC) - Primary    History of atrial fibrillation, currently well managed.   Plan:  Continue as-needed use of diltiazem for palpitations. Follow-up with cardiology for regular monitoring. Consider anticoagulation therapy optimization if not already addressed.      Relevant Medications   Semaglutide (RYBELSUS) 3 MG TABS   Other Relevant Orders   AMB Referral to Chronic Care Management Services   Lipid panel (Completed)   Microalbumin / creatinine urine ratio (Completed)   Urinalysis, Routine w reflex microscopic (Completed)   Vitamin D 1,25 dihydroxy   CBC with Differential/Platelet (Completed)   Comprehensive metabolic panel (Completed)   For Home Use Only DME Diabetic Shoe     Respiratory   COPD (chronic obstructive pulmonary disease) (HCC)   Relevant Medications   Semaglutide (RYBELSUS) 3 MG TABS   Other Relevant Orders   AMB Referral to Chronic Care Management Services   Lipid panel (Completed)   Microalbumin / creatinine urine ratio (Completed)   Urinalysis, Routine w reflex microscopic (Completed)   Vitamin D 1,25 dihydroxy   CBC with Differential/Platelet (Completed)   Comprehensive metabolic panel (Completed)   For Home Use Only DME Diabetic Shoe     Endocrine   Diabetes mellitus type 2 in obese ([place])    Blood sugar well controlled with current medication regimen.   Plan:  Continue empagliflozin  (JARDIANCE) 10 MG daily. Add Rybelsus possible weight loss and cardiac protection Monitor blood glucose as per routine. Review additional labs upon availability.      Relevant Medications   Semaglutide (RYBELSUS) 3 MG TABS   CKD stage 3 secondary to diabetes [name])    Managing in the context of diabetes.   Plan:  Continue to monitor renal function with routine blood work. Encourage hydration and control of blood pressure to support renal health. Discuss potential nephroprotective benefits of current diabetic medication.      Relevant Medications   Semaglutide (RYBELSUS) 3 MG TABS     Other   Morbid obesity (HCC)   Relevant Medications   Semaglutide (RYBELSUS) 3 MG TABS   Other Relevant Orders   AMB Referral to Chronic Care Management Services   Lipid panel (Completed)   Microalbumin / creatinine urine ratio (Completed)   Urinalysis, Routine w reflex microscopic (Completed)   Vitamin D 1,25 dihydroxy   CBC with Differential/Platelet (Completed)   Comprehensive metabolic panel (Completed)   For Home Use Only DME Diabetic Shoe   Class 3 severe obesity due to excess calories with body mass index (BMI) of 50.0 to 59.9 in adult [name])    Patient has BMI>50 which is concerning for multiple comorbidities.   Plan: Discuss the impact of obesity on health including diabetes, hypertension, and cardiovascular risk. Trial Rybelsus Provide resources for weight management and diet modification. Monitor weight and offer support for weight reduction efforts, including potential for bariatric surgery if lifestyle interventions fail.      Relevant Medications   Semaglutide (RYBELSUS) 3 MG TABS  Diplopia    Ongoing double vision requiring specialist evaluation.   Plan:  Encourage follow-up with ophthalmology as previously advised. Monitor for any progression of symptoms or impact on activities of daily living.      Relevant Medications   Semaglutide (RYBELSUS) 3 MG TABS   Other  Relevant Orders   AMB Referral to Chronic Care Management Services   Lipid panel (Completed)   Microalbumin / creatinine urine ratio (Completed)   Urinalysis, Routine w reflex microscopic (Completed)   Vitamin D 1,25 dihydroxy   CBC with Differential/Platelet (Completed)   Comprehensive metabolic panel (Completed)   For Home Use Only DME Diabetic Shoe   Ambulatory referral to Ophthalmology   Otalgia of left ear    Concerns regarding sensation and sharp pain in the left ear, with noted slight bleeding.   Plan:  Prescribe ciprofloxacin ear drops for possible otitis externa. If symptoms persist or worsen, consider referral to an ENT specialist. Follow-up on symptomatic relief and efficacy of treatment.      Relevant Medications   Semaglutide (RYBELSUS) 3 MG TABS   ciprofloxacin (CILOXAN) 0.3 % ophthalmic solution   Other Relevant Orders   AMB Referral to Chronic Care Management Services   Lipid panel (Completed)   Microalbumin / creatinine urine ratio (Completed)   Urinalysis, Routine w reflex microscopic (Completed)   Vitamin D 1,25 dihydroxy   CBC with Differential/Platelet (Completed)   Comprehensive metabolic panel (Completed)   For Home Use Only DME Diabetic Shoe   Other Visit Diagnoses     Small vessel disease (HCC)       Relevant Medications   Semaglutide (RYBELSUS) 3 MG TABS   ciprofloxacin (CILOXAN) 0.3 % ophthalmic solution   Other Relevant Orders   AMB Referral to Chronic Care Management Services   Lipid panel (Completed)   Microalbumin / creatinine urine ratio (Completed)   Urinalysis, Routine w reflex microscopic (Completed)   Vitamin D 1,25 dihydroxy   CBC with Differential/Platelet (Completed)   Comprehensive metabolic panel (Completed)   For Home Use Only DME Diabetic Shoe   Acquired left ventricular hypertrophy       Relevant Medications   Semaglutide (RYBELSUS) 3 MG TABS   ciprofloxacin (CILOXAN) 0.3 % ophthalmic solution   Other Relevant Orders    AMB Referral to Chronic Care Management Services   Lipid panel (Completed)   Microalbumin / creatinine urine ratio (Completed)   Urinalysis, Routine w reflex microscopic (Completed)   Vitamin D 1,25 dihydroxy   CBC with Differential/Platelet (Completed)   Comprehensive metabolic panel (Completed)   For Home Use Only DME Diabetic Shoe   Onychomycosis of multiple toenails with type 2 diabetes mellitus (HCC)       Relevant Medications   Semaglutide (RYBELSUS) 3 MG TABS   ciprofloxacin (CILOXAN) 0.3 % ophthalmic solution   Other Relevant Orders   AMB Referral to Chronic Care Management Services   Lipid panel (Completed)   Microalbumin / creatinine urine ratio (Completed)   Urinalysis, Routine w reflex microscopic (Completed)   Vitamin D 1,25 dihydroxy   CBC with Differential/Platelet (Completed)   Comprehensive metabolic panel (Completed)   For Home Use Only DME Diabetic Shoe       Medications Discontinued During This Encounter  Medication Reason   neomycin-polymyxin-hydrocortisone (CORTISPORIN) OTIC solution       Subjective:  HPI: Encounter date: 03/14/2022  Rose Miller is a 67 y.o. female who has Morbid obesity ([place]); Ventral hernia; IT band syndrome; Right shoulder pain; Periumbilical pain; Hyperlipidemia LDL  goal <70; Class 3 severe obesity due to excess calories with body mass index (BMI) of 50.0 to 59.9 in adult [facility]); Diabetes mellitus type 2 in obese [facility]); CKD stage 3 secondary to diabetes ([place]); Postmenopausal estrogen deficiency; Former smoker; Long-term use of aspirin therapy; Hypertension associated with diabetes ([place]); Aortic valve disease; COPD (chronic obstructive pulmonary disease) ([place]); Pharyngoesophageal dysphagia; Family history of colon cancer in father; Diplopia; Allergic rhinitis; PAF (paroxysmal atrial fibrillation) ([place]); and Otalgia of left ear on their problem list..   She  has a past medical history of Adhesive capsulitis, Aortic valve disease,  Arthritis, Asthma, Back pain, CKD (chronic kidney disease) stage 3, GFR 30-59 ml/min (HCC), Constipation, COPD (chronic obstructive pulmonary disease) ([place]), Diabetes mellitus without complication ([place]), Diuretic-induced hypokalemia, Hypercholesterolemia, Hypertension, IT band syndrome, Morbid obesity ([place]), Palpitations, Periumbilical pain, Postmenopausal estrogen deficiency, Prediabetes, S/P TAVR (transcatheter aortic valve replacement) (12/26/2020), and Ventral hernia.[name] Kitchen   CHIEF COMPLAINT: Patient presents for medical management of chronic issues, specifically diabetes follow-up and new complaint of left ear discomfort with occasional sharp pain and noted presence of blood on the finger upon inspection.  HISTORY OF PRESENT ILLNESS:   Diabetes Mellitus: The patient reports diabetes is currently well controlled with the latest hemoglobin A1c being 6.2. She is established on Jardiance 10 mg once daily and managing her condition well.  Ear Discomfort: For the past few months, the patient has experienced unusual sensations in the left ear, at times accompanied by sharp pain, and noticed blood upon touching the ear. There has been no treatment initiated nor any change in symptoms.  Hypertension: The patient has a history of hypertension; today's reading is 134/86. Generally, she checks her blood pressure at home but doesn't recall the latest reading.  Kidney Disease: There is an established diagnosis of chronic kidney disease, incidentally noted during the evaluation of diabetes related complications.  Atrial Fibrillation: Patient reports a history of atrial fibrillation diagnosed incidentally after a procedure. She is on diltiazem as needed for palpitations.  Ophthalmologic: Patient reports ongoing issues with double vision predominantly in the left eye, having had an eye exam but still needing to follow up with an ophthalmologist.  Weight Management: Patient expresses interest in losing weight and is  considering diet and lifestyle changes before exploring bariatric surgery options.  ROS:  Cardiology: No chest pain, palpitations or shortness of breath.  Endocrine: Blood sugar well controlled.  Respiratory: No cough or shortness of breath.  Gastrointestinal: No abdominal pain or changes in bowel habit.  Neurological: Double vision mentioned.  Dermatological: No rashes, wounds, or ulcers reported.  ENT: Complaints of left ear discomfort without significant discharge.  Past Surgical History:  Procedure Laterality Date   BIOPSY  05/01/2021   Procedure: BIOPSY;  Surgeon: Jackquline Denmark, MD;  Location: [facility];  Service: Gastroenterology;;   CESAREAN SECTION  1989   COLONOSCOPY     COLONOSCOPY WITH PROPOFOL N/A 05/01/2021   Procedure: COLONOSCOPY WITH PROPOFOL;  Surgeon: Jackquline Denmark, MD;  Location: [facility];  Service: Gastroenterology;  Laterality: N/A;   ESOPHAGOGASTRODUODENOSCOPY (EGD) WITH PROPOFOL N/A 05/01/2021   Procedure: ESOPHAGOGASTRODUODENOSCOPY (EGD) WITH PROPOFOL;  Surgeon: Jackquline Denmark, MD;  Location: [facility];  Service: Gastroenterology;  Laterality: N/A;   INTRAOPERATIVE TRANSTHORACIC ECHOCARDIOGRAM N/A 12/26/2020   Procedure: INTRAOPERATIVE TRANSTHORACIC ECHOCARDIOGRAM;  Surgeon: Early Osmond, MD;  Location: [facility];  Service: Open Heart Surgery;  Laterality: N/A;   MALONEY DILATION  05/01/2021   Procedure: Venia Minks DILATION;  Surgeon: Jackquline Denmark, MD;  Location:  [facility];  Service: Gastroenterology;;   POLYPECTOMY  05/01/2021   Procedure: POLYPECTOMY;  Surgeon: Jackquline Denmark, MD;  Location: [facility];  Service: Gastroenterology;;   RIGHT/LEFT HEART CATH AND CORONARY ANGIOGRAPHY N/A 11/24/2020   Procedure: RIGHT/LEFT HEART CATH AND CORONARY ANGIOGRAPHY;  Surgeon: Belva Crome, MD;  Location: [facility];  Service: Cardiovascular;  Laterality: N/A;   TONSILLECTOMY AND ADENOIDECTOMY  1960   TRANSCATHETER AORTIC VALVE REPLACEMENT,  TRANSFEMORAL N/A 12/26/2020   Procedure: TRANSCATHETER AORTIC VALVE REPLACEMENT, TRANSFEMORAL;  Surgeon: Early Osmond, MD;  Location: [place] [facility];  Service: Open Heart Surgery;  Laterality: N/A;    Outpatient Medications Prior to Visit  Medication Sig Dispense Refill   amLODipine (NORVASC) 10 MG tablet TAKE 1 TABLET DAILY 90 tablet 3   amoxicillin (AMOXIL) 500 MG tablet TAKE 4 TABLETS 1 HOUR PRIORTO DENTAL PROCEDURES AND   CLEANINGS 12 tablet 6   apixaban (ELIQUIS) 5 MG TABS tablet Take 1 tablet (5 mg total) by mouth 2 (two) times daily. 180 tablet 1   Ascorbic Acid (VITAMIN C) 1000 MG tablet Take 1,000 mg by mouth daily.     atorvastatin (LIPITOR) 10 MG tablet TAKE 1 TABLET DAILY 90 tablet 3   Bayer Microlet Lancets lancets      Blood Pressure Monitoring (SPHYGMOMANOMETER) MISC 1 Units by Does not apply route daily. 1 each 0   Cholecalciferol 50 MCG (2000 UT) TABS Take 2,000 Units by mouth daily.     Coenzyme Q10 (CO Q-10) 100 MG CAPS Take 100 mg by mouth daily.      Cyanocobalamin (B-12) 500 MCG TABS Take 500 mcg by mouth daily.     diltiazem (CARDIZEM) 30 MG tablet Take 1 tablet (30 mg total) by mouth every 6 (six) hours as needed (fast heart rate, heart palpitations). 30 tablet 6   empagliflozin (JARDIANCE) 10 MG TABS tablet Take 1 tablet (10 mg total) by mouth daily before breakfast. 90 tablet 0   fluticasone (FLONASE) 50 MCG/ACT nasal spray Place 2 sprays into both nostrils daily. 16 g 3   glucose blood (CONTOUR NEXT TEST) test strip      hydrALAZINE (APRESOLINE) 50 MG tablet TAKE 1 TABLET 3 TIMES A DAY 270 tablet 3   ipratropium (ATROVENT) 0.06 % nasal spray Place 2 sprays into both nostrils 4 (four) times daily. 15 mL 12   losartan (COZAAR) 100 MG tablet TAKE 1 TABLET DAILY,       DISCONTINUE LOSARTAN /     HYDROCHLOROTHIAZIDE 90 tablet 3   Menthol, Topical Analgesic, (BLUE-EMU MAXIMUM STRENGTH) 2.5 % LIQD Apply 1 application topically daily as needed (pain).     metoprolol  succinate (TOPROL-XL) 25 MG 24 hr tablet TAKE 1 TABLET DAILY 90 tablet 3   montelukast (SINGULAIR) 10 MG tablet Take 1 tablet (10 mg total) by mouth at bedtime. 30 tablet 6   Olopatadine HCl 0.2 % SOLN Apply 1 drop to eye in the morning and at bedtime. 2.5 mL 3   omeprazole (PRILOSEC) 20 MG capsule Take 20 mg by mouth daily.     spironolactone (ALDACTONE) 25 MG tablet Take 0.5 tablets (12.5 mg total) by mouth daily. 45 tablet 3   Zinc 50 MG TABS Take 50 mg by mouth daily.     neomycin-polymyxin-hydrocortisone (CORTISPORIN) OTIC solution Apply 1-2 drops to toenail if there is soreness once daily after shower 10 mL 0   PEG-KCl-NaCl-NaSulf-Na Asc-C (PLENVU) 140 g SOLR Take 1 kit by mouth as directed. 1  each 0   No facility-administered medications prior to visit.    Family History  Problem Relation Age of Onset   Dementia Mother    Arthritis Mother    Cancer Mother        pancreatic   Hypertension Mother    Obesity Mother    Cancer Father        prostate and colon   Colon cancer Father    Heart disease Father    Thyroid disease Father     Social History   Socioeconomic History   Marital status: Single    Spouse name: Not on file   Number of children: 1   Years of education: Not on file   Highest education level: Not on file  Occupational History   Occupation: retired  Tobacco Use   Smoking status: Former    Packs/day: 1.00    Types: Cigarettes    Start date: 1976    Quit date: 2013    Years since quitting: 11.1    Passive exposure: Never   Smokeless tobacco: Never  Vaping Use   Vaping Use: Never used  Substance and Sexual Activity   Alcohol use: Yes    Comment: socially   Drug use: No   Sexual activity: Not on file  Other Topics Concern   Not on file  Social History Narrative   Not on file   Social Determinants of Health   Financial Resource Strain: Low Risk  (03/20/2021)   Overall Financial Resource Strain (CARDIA)    Difficulty of Paying Living Expenses: Not  hard at all  Food Insecurity: No Food Insecurity (03/20/2021)   Hunger Vital Sign    Worried About Running Out of Food in the Last Year: Never true    [place] in the Last Year: Never true  Transportation Needs: No Transportation Needs (03/20/2021)   PRAPARE - Hydrologist (Medical): No    Lack of Transportation (Non-Medical): No  Physical Activity: Inactive (03/20/2021)   Exercise Vital Sign    Days of Exercise per Week: 0 days    Minutes of Exercise per Session: 0 min  Stress: No Stress Concern Present (03/20/2021)   [place]    Feeling of Stress : Not at all  Social Connections: Socially Isolated (03/20/2021)   Social Connection and Isolation Panel [NHANES]    Frequency of Communication with Friends and Family: Twice a week    Frequency of Social Gatherings with Friends and Family: Twice a week    Attends Religious Services: Never    Marine scientist or Organizations: No    Attends Archivist Meetings: Never    Marital Status: Never married  Intimate Partner Violence: Not At Risk (03/20/2021)   Humiliation, Afraid, Rape, and Kick questionnaire    Fear of Current or Ex-Partner: No    Emotionally Abused: No    Physically Abused: No    Sexually Abused: No  Objective:  Physical Exam: BP 134/86 (BP Location: Left Arm, Patient Position: Sitting, Cuff Size: Large)   Pulse 70   Temp 97.9 F (36.6 C) (Temporal)   Wt (!) 360 lb (163.3 kg)   SpO2 96%   BMI 50.92 kg/m    Gen: NAD, resting comfortably HEENT: Left ear exhibits discomfort with history of minimal bleeding and sharp pain CV: RRR with no murmurs appreciated Pulm: NWOB, CTAB with no crackles, wheezes, or rhonchi GI: Normal bowel sounds present. Soft, Nontender, Nondistended. MSK: no edema, cyanosis, or clubbing  noted Skin: warm, dry Neuro: grossly normal, moves all extremities Psych: Normal affect and thought content  HgA1C: 6.2  IMAGING: Previous echocardiograms and imaging studies showing aortic valve replacement function and left ventricular hypertrophy.      Alesia Banda, MD, MS

## 2022-03-17 ENCOUNTER — Encounter: Payer: Self-pay | Admitting: Family Medicine

## 2022-03-17 NOTE — Assessment & Plan Note (Signed)
Ongoing double vision requiring specialist evaluation.   Plan:  Encourage follow-up with ophthalmology as previously advised. Monitor for any progression of symptoms or impact on activities of daily living.

## 2022-03-17 NOTE — Assessment & Plan Note (Signed)
History of atrial fibrillation, currently well managed.   Plan:  Continue as-needed use of diltiazem for palpitations. Follow-up with cardiology for regular monitoring. Consider anticoagulation therapy optimization if not already addressed.

## 2022-03-17 NOTE — Assessment & Plan Note (Signed)
Managing in the context of diabetes.   Plan:  Continue to monitor renal function with routine blood work. Encourage hydration and control of blood pressure to support renal health. Discuss potential nephroprotective benefits of current diabetic medication.

## 2022-03-17 NOTE — Assessment & Plan Note (Signed)
Concerns regarding sensation and sharp pain in the left ear, with noted slight bleeding.   Plan:  Prescribe ciprofloxacin ear drops for possible otitis externa. If symptoms persist or worsen, consider referral to an ENT specialist. Follow-up on symptomatic relief and efficacy of treatment.

## 2022-03-17 NOTE — Assessment & Plan Note (Signed)
Blood sugar well controlled with current medication regimen.   Plan:  Continue empagliflozin (JARDIANCE) 10 MG daily. Add Rybelsus possible weight loss and cardiac protection Monitor blood glucose as per routine. Review additional labs upon availability.

## 2022-03-17 NOTE — Assessment & Plan Note (Signed)
Patient's blood pressure slightly elevated but within acceptable range considering her chronic conditions.   Plan:  Encourage routine monitoring of blood pressure at home. Continue current antihypertensive medications. Review sodium intake and discuss lifestyle modifications to support blood pressure control.

## 2022-03-17 NOTE — Assessment & Plan Note (Signed)
Patient has BMI>50 which is concerning for multiple comorbidities.   Plan: Discuss the impact of obesity on health including diabetes, hypertension, and cardiovascular risk. Trial Rybelsus Provide resources for weight management and diet modification. Monitor weight and offer support for weight reduction efforts, including potential for bariatric surgery if lifestyle interventions fail.

## 2022-03-18 LAB — VITAMIN D 1,25 DIHYDROXY
Vitamin D 1, 25 (OH)2 Total: 21 pg/mL (ref 18–72)
Vitamin D2 1, 25 (OH)2: <8 pg/mL
Vitamin D3 1, 25 (OH)2: 21 pg/mL

## 2022-03-25 ENCOUNTER — Ambulatory Visit (INDEPENDENT_AMBULATORY_CARE_PROVIDER_SITE_OTHER): Payer: HMO

## 2022-03-25 VITALS — Ht 70.0 in | Wt 360.0 lb

## 2022-03-25 DIAGNOSIS — Z0000 Encounter for general adult medical examination without abnormal findings: Secondary | ICD-10-CM | POA: Diagnosis not present

## 2022-03-25 NOTE — Progress Notes (Signed)
I connected with  Rose Miller on 03/25/22 by a audio enabled telemedicine application and verified that I am speaking with the correct person using two identifiers.  Patient Location: Home  Provider Location: Office/Clinic  I discussed the limitations of evaluation and management by telemedicine. The patient expressed understanding and agreed to proceed.  Subjective:   Rose Miller is a 67 y.o. female who presents for Medicare Annual (Subsequent) preventive examination.  Patient Medicare AWV questionnaire was completed by the patient on 03/21/2022; I have confirmed that all information answered by patient is correct and no changes since this date.     Review of Systems     Cardiac Risk Factors include: advanced age (>44mn, >>103women);diabetes mellitus;dyslipidemia;hypertension;obesity (BMI >30kg/m2)     Objective:    Today's Vitals   03/25/22 1356 03/25/22 1357  Weight: (!) 360 lb (163.3 kg)   Height: '5\' 10"'$  (1.778 m)   PainSc:  3    Body mass index is 51.65 kg/m.     03/25/2022    2:02 PM 05/01/2021    6:29 AM 03/20/2021    1:00 PM 12/26/2020    7:25 AM 12/22/2020    9:25 AM 11/24/2020   11:31 AM 02/22/2020    9:10 AM  Advanced Directives  Does Patient Have a Medical Advance Directive? No No Yes No No No No  Type of AComptrollerLiving will      Copy of [place] Chart?   No - copy requested      Would patient like information on creating a medical advance directive?  No - Patient declined  No - Patient declined No - Patient declined No - Patient declined No - Patient declined    Current Medications (verified) Outpatient Encounter Medications as of 03/25/2022  Medication Sig   amLODipine (NORVASC) 10 MG tablet TAKE 1 TABLET DAILY   amoxicillin (AMOXIL) 500 MG tablet TAKE 4 TABLETS 1 HOUR PRIORTO DENTAL PROCEDURES AND   CLEANINGS   apixaban (ELIQUIS) 5 MG TABS tablet Take 1 tablet (5 mg total) by mouth 2  (two) times daily.   Ascorbic Acid (VITAMIN C) 1000 MG tablet Take 1,000 mg by mouth daily.   atorvastatin (LIPITOR) 10 MG tablet TAKE 1 TABLET DAILY   Bayer Microlet Lancets lancets    Blood Pressure Monitoring (SPHYGMOMANOMETER) MISC 1 Units by Does not apply route daily.   Cholecalciferol 50 MCG (2000 UT) TABS Take 2,000 Units by mouth daily.   Coenzyme Q10 (CO Q-10) 100 MG CAPS Take 100 mg by mouth daily.    Cyanocobalamin (B-12) 500 MCG TABS Take 500 mcg by mouth daily.   diltiazem (CARDIZEM) 30 MG tablet Take 1 tablet (30 mg total) by mouth every 6 (six) hours as needed (fast heart rate, heart palpitations).   empagliflozin (JARDIANCE) 10 MG TABS tablet Take 1 tablet (10 mg total) by mouth daily before breakfast.   fluticasone (FLONASE) 50 MCG/ACT nasal spray Place 2 sprays into both nostrils daily.   glucose blood (CONTOUR NEXT TEST) test strip    hydrALAZINE (APRESOLINE) 50 MG tablet TAKE 1 TABLET 3 TIMES A DAY   ipratropium (ATROVENT) 0.06 % nasal spray Place 2 sprays into both nostrils 4 (four) times daily.   losartan (COZAAR) 100 MG tablet TAKE 1 TABLET DAILY,       DISCONTINUE LOSARTAN /     HYDROCHLOROTHIAZIDE   Menthol, Topical Analgesic, (BLUE-EMU MAXIMUM STRENGTH) 2.5 % LIQD Apply 1 application  topically daily as needed (pain).   metoprolol succinate (TOPROL-XL) 25 MG 24 hr tablet TAKE 1 TABLET DAILY   montelukast (SINGULAIR) 10 MG tablet Take 1 tablet (10 mg total) by mouth at bedtime.   Olopatadine HCl 0.2 % SOLN Apply 1 drop to eye in the morning and at bedtime.   omeprazole (PRILOSEC) 20 MG capsule Take 20 mg by mouth daily.   spironolactone (ALDACTONE) 25 MG tablet Take 0.5 tablets (12.5 mg total) by mouth daily.   Zinc 50 MG TABS Take 50 mg by mouth daily.   PEG-KCl-NaCl-NaSulf-Na Asc-C (PLENVU) 140 g SOLR Take 1 kit by mouth as directed.   Semaglutide (RYBELSUS) 3 MG TABS Take 1 tablet (3 mg total) by mouth daily. (Patient not taking: Reported on 03/25/2022)   No  facility-administered encounter medications on file as of 03/25/2022.    Allergies (verified) Patient has no known allergies.   History: Past Medical History:  Diagnosis Date   Adhesive capsulitis    Aortic valve disease    Arthritis    Asthma    Back pain    CKD (chronic kidney disease) stage 3, GFR 30-59 ml/min (HCC)    Constipation    COPD (chronic obstructive pulmonary disease) (HCC)    Diabetes mellitus without complication (HCC)    Diuretic-induced hypokalemia    Hypercholesterolemia    Hypertension    IT band syndrome    Morbid obesity (HCC)    Palpitations    Periumbilical pain    Postmenopausal estrogen deficiency    Prediabetes    S/P TAVR (transcatheter aortic valve replacement) 12/26/2020   Ventral hernia    Past Surgical History:  Procedure Laterality Date   BIOPSY  05/01/2021   Procedure: BIOPSY;  Surgeon: Jackquline Denmark, MD;  Location: [facility];  Service: Gastroenterology;;   CESAREAN SECTION  1989   COLONOSCOPY     COLONOSCOPY WITH PROPOFOL N/A 05/01/2021   Procedure: COLONOSCOPY WITH PROPOFOL;  Surgeon: Jackquline Denmark, MD;  Location: [facility];  Service: Gastroenterology;  Laterality: N/A;   ESOPHAGOGASTRODUODENOSCOPY (EGD) WITH PROPOFOL N/A 05/01/2021   Procedure: ESOPHAGOGASTRODUODENOSCOPY (EGD) WITH PROPOFOL;  Surgeon: Jackquline Denmark, MD;  Location: [facility];  Service: Gastroenterology;  Laterality: N/A;   INTRAOPERATIVE TRANSTHORACIC ECHOCARDIOGRAM N/A 12/26/2020   Procedure: INTRAOPERATIVE TRANSTHORACIC ECHOCARDIOGRAM;  Surgeon: Early Osmond, MD;  Location: [facility];  Service: Open Heart Surgery;  Laterality: N/A;   MALONEY DILATION  05/01/2021   Procedure: Venia Minks DILATION;  Surgeon: Jackquline Denmark, MD;  Location: [facility];  Service: Gastroenterology;;   POLYPECTOMY  05/01/2021   Procedure: POLYPECTOMY;  Surgeon: Jackquline Denmark, MD;  Location: [facility];  Service: Gastroenterology;;   RIGHT/LEFT HEART CATH AND CORONARY  ANGIOGRAPHY N/A 11/24/2020   Procedure: RIGHT/LEFT HEART CATH AND CORONARY ANGIOGRAPHY;  Surgeon: Belva Crome, MD;  Location: [facility];  Service: Cardiovascular;  Laterality: N/A;   TONSILLECTOMY AND ADENOIDECTOMY  1960   TRANSCATHETER AORTIC VALVE REPLACEMENT, TRANSFEMORAL N/A 12/26/2020   Procedure: TRANSCATHETER AORTIC VALVE REPLACEMENT, TRANSFEMORAL;  Surgeon: Early Osmond, MD;  Location: [facility];  Service: Open Heart Surgery;  Laterality: N/A;   Family History  Problem Relation Age of Onset   Dementia Mother    Arthritis Mother    Cancer Mother        pancreatic   Hypertension Mother    Obesity Mother    Cancer Father        prostate and colon   Colon cancer Father  Heart disease Father    Thyroid disease Father    Social History   Socioeconomic History   Marital status: Single    Spouse name: Not on file   Number of children: 1   Years of education: Not on file   Highest education level: Not on file  Occupational History   Occupation: retired  Tobacco Use   Smoking status: Former    Packs/day: 1.00    Types: Cigarettes    Start date: 1976    Quit date: 2013    Years since quitting: 11.1    Passive exposure: Never   Smokeless tobacco: Never  Vaping Use   Vaping Use: Never used  Substance and Sexual Activity   Alcohol use: Yes    Comment: socially   Drug use: No   Sexual activity: Not on file  Other Topics Concern   Not on file  Social History Narrative   Not on file   Social Determinants of Health   Financial Resource Strain: Unknown (03/21/2022)   Overall Financial Resource Strain (CARDIA)    Difficulty of Paying Living Expenses: Patient refused  Food Insecurity: No Food Insecurity (03/25/2022)   Hunger Vital Sign    Worried About Running Out of Food in the Last Year: Never true    Ran Out of Food in the Last Year: Never true  Transportation Needs: No Transportation Needs (03/21/2022)   PRAPARE - Radiographer, therapeutic (Medical): No    Lack of Transportation (Non-Medical): No  Physical Activity: Unknown (03/21/2022)   Exercise Vital Sign    Days of Exercise per Week: Patient refused    Minutes of Exercise per Session: Patient refused  Stress: Unknown (03/21/2022)   [place]    Feeling of Stress : Patient refused  Social Connections: Unknown (03/21/2022)   Social Connection and Isolation Panel [NHANES]    Frequency of Communication with Friends and Family: Patient refused    Frequency of Social Gatherings with Friends and Family: Patient refused    Attends Religious Services: Not on Advertising copywriter or Organizations: Patient refused    Attends Archivist Meetings: Patient refused    Marital Status: Patient refused    Tobacco Counseling Counseling given: Not Answered   Clinical Intake:  Pre-visit preparation completed: Yes  Pain : 0-10 Pain Score: 3  Pain Type: Chronic pain Pain Location: Back (right arm) Pain Orientation: Lower Pain Descriptors / Indicators: Aching Pain Onset: More than a month ago Pain Frequency: Intermittent     Nutritional Status: BMI > 30  Obese Nutritional Risks: None Diabetes: Yes  How often do you need to have someone help you when you read instructions, pamphlets, or other written materials from your doctor or pharmacy?: 1 - Never  Diabetic? Yes Nutrition Risk Assessment:  Has the patient had any N/V/D within the last 2 months?  No  Does the patient have any non-healing wounds?  No  Has the patient had any unintentional weight loss or weight gain?  No   Diabetes:  Is the patient diabetic?  Yes  If diabetic, was a CBG obtained today?  No  Did the patient bring in their glucometer from home?  No  How often do you monitor your CBG's? Does not.   Financial Strains and Diabetes Management:  Are you having any financial strains with the device, your  supplies or your medication? No .  Does  the patient want to be seen by Chronic Care Management for management of their diabetes?  No  Would the patient like to be referred to a Nutritionist or for Diabetic Management?  No   Diabetic Exams:  Diabetic Eye Exam: Completed 05/25/2021 Diabetic Foot Exam: Completed 08/13/2021   Interpreter Needed?: No  Information entered by :: NAllen LPN   Activities of Daily Living    03/25/2022    2:03 PM 03/21/2022    9:38 AM  In your present state of health, do you have any difficulty performing the following activities:  Hearing? 0 0  Vision? 0 0  Difficulty concentrating or making decisions? 0 0  Walking or climbing stairs? 1 1  Dressing or bathing? 0 0  Doing errands, shopping? 0 0  Preparing Food and eating ? N N  Using the Toilet? N N  In the past six months, have you accidently leaked urine? N N  Do you have problems with loss of bowel control? N N  Managing your Medications? N N  Managing your Finances? N N  Housekeeping or managing your Housekeeping? N N    Patient Care Team: Libby Maw, MD as PCP - General (Family Medicine) Belva Crome, MD (Inactive) as PCP - Cardiology (Cardiology)  Indicate any recent Medical Services you may have received from other than [facility] providers in the past year (date may be approximate).     Assessment:   This is a routine wellness examination for [place].  Hearing/Vision screen Vision Screening - Comments:: Regular eye exams, [facility]  Dietary issues and exercise activities discussed: Current Exercise Habits: The patient does not participate in regular exercise at present   Goals Addressed             This Visit's Progress    Patient Stated       03/25/2022, wants to lose weight       Depression Screen    03/25/2022    2:03 PM 03/14/2022   11:03 AM 09/12/2021   11:17 AM 06/12/2021   11:13 AM 03/20/2021    1:05 PM 03/20/2021   12:58 PM 02/22/2020    9:12 AM  PHQ 2/9  Scores  PHQ - 2 Score 0 0 0 0 0 0 0  PHQ- 9 Score  2         Fall Risk    03/21/2022    9:38 AM 09/12/2021   11:17 AM 06/12/2021   11:13 AM 03/20/2021    1:01 PM 12/12/2020    9:03 AM  Fall Risk   Falls in the past year? 0 0 0 0 1  Number falls in past yr: 0 0 0 0 0  Injury with Fall? 0   0 0  Risk for fall due to : Medication side effect   No Fall Risks History of fall(s)  Follow up Falls prevention discussed;Education provided;Falls evaluation completed   Falls evaluation completed Falls evaluation completed    FALL RISK PREVENTION PERTAINING TO THE HOME:  Any stairs in or around the home? Yes  If so, are there any without handrails? No  Home free of loose throw rugs in walkways, pet beds, electrical cords, etc? Yes  Adequate lighting in your home to reduce risk of falls? Yes   ASSISTIVE DEVICES UTILIZED TO PREVENT FALLS:  Life alert? No  Use of a cane, walker or w/c? No  Grab bars in the bathroom? No  Shower chair or bench in shower? Yes  Elevated  toilet seat or a handicapped toilet? No   TIMED UP AND GO:  Was the test performed? No .      Cognitive Function:        03/25/2022    2:04 PM  6CIT Screen  What Year? 0 points  What month? 0 points  What time? 0 points  Count back from 20 0 points  Months in reverse 0 points  Repeat phrase 2 points  Total Score 2 points    Immunizations Immunization History  Administered Date(s) Administered   Influenza,inj,Quad PF,6+ Mos 12/19/2014   Tdap 12/19/2014, 12/19/2014   Zoster Recombinat (Shingrix) 06/12/2021, 09/12/2021    TDAP status: Up to date  Flu Vaccine status: Declined, Education has been provided regarding the importance of this vaccine but patient still declined. Advised may receive this vaccine at local pharmacy or Health Dept. Aware to provide a copy of the vaccination record if obtained from local pharmacy or Health Dept. Verbalized acceptance and understanding.  Pneumococcal vaccine status:  Declined,  Education has been provided regarding the importance of this vaccine but patient still declined. Advised may receive this vaccine at local pharmacy or Health Dept. Aware to provide a copy of the vaccination record if obtained from local pharmacy or Health Dept. Verbalized acceptance and understanding.   Covid-19 vaccine status: Declined, Education has been provided regarding the importance of this vaccine but patient still declined. Advised may receive this vaccine at local pharmacy or Health Dept.or vaccine clinic. Aware to provide a copy of the vaccination record if obtained from local pharmacy or Health Dept. Verbalized acceptance and understanding.  Qualifies for Shingles Vaccine? Yes   Zostavax completed Yes   Shingrix Completed?: Yes  Screening Tests Health Maintenance  Topic Date Due   Pneumonia Vaccine 86+ Years old (1 of 2 - PCV) Never done   Medicare Annual Wellness (AWV)  03/21/2022   INFLUENZA VACCINE  04/14/2022 (Originally 08/14/2021)   FOOT EXAM  08/14/2022   HEMOGLOBIN A1C  09/12/2022   MAMMOGRAM  12/06/2022   OPHTHALMOLOGY EXAM  03/05/2023   Diabetic kidney evaluation - eGFR measurement  03/14/2023   Diabetic kidney evaluation - Urine ACR  03/14/2023   DTaP/Tdap/Td (3 - Td or Tdap) 12/18/2024   COLONOSCOPY (Pts 45-45yr Insurance coverage will need to be confirmed)  05/02/2026   DEXA SCAN  Completed   Hepatitis C Screening  Completed   Zoster Vaccines- Shingrix  Completed   HPV VACCINES  Aged Out   COVID-19 Vaccine  Discontinued    Health Maintenance  Health Maintenance Due  Topic Date Due   Pneumonia Vaccine 67 Years old (1 of 2 - PCV) Never done   Medicare Annual Wellness (AWV)  03/21/2022    Colorectal cancer screening: Type of screening: Colonoscopy. Completed 05/01/2021. Repeat every 5 years  Mammogram status: Completed 12/05/2021. Repeat every year  Bone Density status: Completed 05/14/2019.   Lung Cancer Screening: (Low Dose CT Chest  recommended if Age 961-80years, 30 pack-year currently smoking OR have quit w/in 15years.) does not qualify.   Lung Cancer Screening Referral: no  Additional Screening:  Hepatitis C Screening: does qualify; Completed 04/07/2017  Vision Screening: Recommended annual ophthalmology exams for early detection of glaucoma and other disorders of the eye. Is the patient up to date with their annual eye exam?  Yes  Who is the provider or what is the name of the [facility] which the patient attends [facility] exams[facility]  [facility] provider, would they like to be referred to a provider to establish care? No .   Dental Screening: Recommended annual dental exams for proper oral hygiene  Community Resource Referral / Chronic Care Management: CRR required this visit?  No   CCM required this visit?  No      Plan:     I have personally reviewed and noted the following in the patient's chart:   Medical and social history Use of alcohol, tobacco or illicit drugs  Current medications and supplements including opioid prescriptions. Patient is not currently taking opioid prescriptions. Functional ability and status Nutritional status Physical activity Advanced directives List of other physicians Hospitalizations, surgeries, and ER visits in previous 12 months Vitals Screenings to include cognitive, depression, and falls Referrals and appointments  In addition, I have reviewed and discussed with patient certain preventive protocols, quality metrics, and best practice recommendations. A written personalized care plan for preventive services as well as general preventive health recommendations were provided to patient.     Kellie Simmering, LPN   [id number]   Nurse Notes: none  Due to this being a virtual visit, the after visit summary with patients personalized plan was offered to patient via mail or my-chart. Patient would like to access on my-chart

## 2022-03-25 NOTE — Patient Instructions (Signed)
Rose Miller , Thank you for taking time to come for your Medicare Wellness Visit. I appreciate your ongoing commitment to your health goals. Please review the following plan we discussed and let me know if I can assist you in the future.   These are the goals we discussed:  Goals      Patient Stated     Would like to lose weight & improve health     Patient Stated     03/25/2022, wants to lose weight        This is a list of the screening recommended for you and due dates:  Health Maintenance  Topic Date Due   Pneumonia Vaccine (1 of 2 - PCV) Never done   Flu Shot  04/14/2022*   Complete foot exam   08/14/2022   Hemoglobin A1C  09/12/2022   Mammogram  12/06/2022   Eye exam for diabetics  03/05/2023   Yearly kidney function blood test for diabetes  03/14/2023   Yearly kidney health urinalysis for diabetes  03/14/2023   Medicare Annual Wellness Visit  03/25/2023   DTaP/Tdap/Td vaccine (3 - Td or Tdap) 12/18/2024   Colon Cancer Screening  05/02/2026   DEXA scan (bone density measurement)  Completed   Hepatitis C Screening: USPSTF Recommendation to screen - Ages 15-79 yo.  Completed   Zoster (Shingles) Vaccine  Completed   HPV Vaccine  Aged Out   COVID-19 Vaccine  Discontinued  *Topic was postponed. The date shown is not the original due date.    Advanced directives: Advance directive discussed with you today.   Conditions/risks identified: none  Next appointment: Follow up in one year for your annual wellness visit    Preventive Care 65 Years and Older, Female Preventive care refers to lifestyle choices and visits with your health care provider that can promote health and wellness. What does preventive care include? A yearly physical exam. This is also called an annual well check. Dental exams once or twice a year. Routine eye exams. Ask your health care provider how often you should have your eyes checked. Personal lifestyle choices, including: Daily care of your teeth  and gums. Regular physical activity. Eating a healthy diet. Avoiding tobacco and drug use. Limiting alcohol use. Practicing safe sex. Taking low-dose aspirin every day. Taking vitamin and mineral supplements as recommended by your health care provider. What happens during an annual well check? The services and screenings done by your health care provider during your annual well check will depend on your age, overall health, lifestyle risk factors, and family history of disease. Counseling  Your health care provider may ask you questions about your: Alcohol use. Tobacco use. Drug use. Emotional well-being. Home and relationship well-being. Sexual activity. Eating habits. History of falls. Memory and ability to understand (cognition). Work and work Statistician. Reproductive health. Screening  You may have the following tests or measurements: Height, weight, and BMI. Blood pressure. Lipid and cholesterol levels. These may be checked every 5 years, or more frequently if you are over 70 years old. Skin check. Lung cancer screening. You may have this screening every year starting at age 53 if you have a 30-pack-year history of smoking and currently smoke or have quit within the past 15 years. Fecal occult blood test (FOBT) of the stool. You may have this test every year starting at age 32. Flexible sigmoidoscopy or colonoscopy. You may have a sigmoidoscopy every 5 years or a colonoscopy every 10 years starting at age 68. Hepatitis  C blood test. Hepatitis B blood test. Sexually transmitted disease (STD) testing. Diabetes screening. This is done by checking your blood sugar (glucose) after you have not eaten for a while (fasting). You may have this done every 1-3 years. Bone density scan. This is done to screen for osteoporosis. You may have this done starting at age 58. Mammogram. This may be done every 1-2 years. Talk to your health care provider about how often you should have regular  mammograms. Talk with your health care provider about your test results, treatment options, and if necessary, the need for more tests. Vaccines  Your health care provider may recommend certain vaccines, such as: Influenza vaccine. This is recommended every year. Tetanus, diphtheria, and acellular pertussis (Tdap, Td) vaccine. You may need a Td booster every 10 years. Zoster vaccine. You may need this after age 49. Pneumococcal 13-valent conjugate (PCV13) vaccine. One dose is recommended after age 76. Pneumococcal polysaccharide (PPSV23) vaccine. One dose is recommended after age 37. Talk to your health care provider about which screenings and vaccines you need and how often you need them. This information is not intended to replace advice given to you by your health care provider. Make sure you discuss any questions you have with your health care provider. Document Released: 01/27/2015 Document Revised: 09/20/2015 Document Reviewed: 11/01/2014 Elsevier Interactive Patient Education  2017 [employer] Prevention in the Home Falls can cause injuries. They can happen to people of all ages. There are many things you can do to make your home safe and to help prevent falls. What can I do on the outside of my home? Regularly fix the edges of walkways and driveways and fix any cracks. Remove anything that might make you trip as you walk through a door, such as a raised step or threshold. Trim any bushes or trees on the path to your home. Use bright outdoor lighting. Clear any walking paths of anything that might make someone trip, such as rocks or tools. Regularly check to see if handrails are loose or broken. Make sure that both sides of any steps have handrails. Any raised decks and porches should have guardrails on the edges. Have any leaves, snow, or ice cleared regularly. Use sand or salt on walking paths during winter. Clean up any spills in your garage right away. This includes oil  or grease spills. What can I do in the bathroom? Use night lights. Install grab bars by the toilet and in the tub and shower. Do not use towel bars as grab bars. Use non-skid mats or decals in the tub or shower. If you need to sit down in the shower, use a plastic, non-slip stool. Keep the floor dry. Clean up any water that spills on the floor as soon as it happens. Remove soap buildup in the tub or shower regularly. Attach bath mats securely with double-sided non-slip rug tape. Do not have throw rugs and other things on the floor that can make you trip. What can I do in the bedroom? Use night lights. Make sure that you have a light by your bed that is easy to reach. Do not use any sheets or blankets that are too big for your bed. They should not hang down onto the floor. Have a firm chair that has side arms. You can use this for support while you get dressed. Do not have throw rugs and other things on the floor that can make you trip. What can I do in the  kitchen? Clean up any spills right away. Avoid walking on wet floors. Keep items that you use a lot in easy-to-reach places. If you need to reach something above you, use a strong step stool that has a grab bar. Keep electrical cords out of the way. Do not use floor polish or wax that makes floors slippery. If you must use wax, use non-skid floor wax. Do not have throw rugs and other things on the floor that can make you trip. What can I do with my stairs? Do not leave any items on the stairs. Make sure that there are handrails on both sides of the stairs and use them. Fix handrails that are broken or loose. Make sure that handrails are as long as the stairways. Check any carpeting to make sure that it is firmly attached to the stairs. Fix any carpet that is loose or worn. Avoid having throw rugs at the top or bottom of the stairs. If you do have throw rugs, attach them to the floor with carpet tape. Make sure that you have a light  switch at the top of the stairs and the bottom of the stairs. If you do not have them, ask someone to add them for you. What else can I do to help prevent falls? Wear shoes that: Do not have high heels. Have rubber bottoms. Are comfortable and fit you well. Are closed at the toe. Do not wear sandals. If you use a stepladder: Make sure that it is fully opened. Do not climb a closed stepladder. Make sure that both sides of the stepladder are locked into place. Ask someone to hold it for you, if possible. Clearly mark and make sure that you can see: Any grab bars or handrails. First and last steps. Where the edge of each step is. Use tools that help you move around (mobility aids) if they are needed. These include: Canes. Walkers. Scooters. Crutches. Turn on the lights when you go into a dark area. Replace any light bulbs as soon as they burn out. Set up your furniture so you have a clear path. Avoid moving your furniture around. If any of your floors are uneven, fix them. If there are any pets around you, be aware of where they are. Review your medicines with your doctor. Some medicines can make you feel dizzy. This can increase your chance of falling. Ask your doctor what other things that you can do to help prevent falls. This information is not intended to replace advice given to you by your health care provider. Make sure you discuss any questions you have with your health care provider. Document Released: 10/27/2008 Document Revised: 06/08/2015 Document Reviewed: 02/04/2014 Elsevier Interactive Patient Education  2017 [employer].

## 2022-03-29 DIAGNOSIS — R600 Localized edema: Secondary | ICD-10-CM | POA: Diagnosis not present

## 2022-03-29 DIAGNOSIS — M792 Neuralgia and neuritis, unspecified: Secondary | ICD-10-CM | POA: Diagnosis not present

## 2022-03-29 DIAGNOSIS — M2041 Other hammer toe(s) (acquired), right foot: Secondary | ICD-10-CM | POA: Diagnosis not present

## 2022-03-29 DIAGNOSIS — I739 Peripheral vascular disease, unspecified: Secondary | ICD-10-CM | POA: Diagnosis not present

## 2022-03-29 DIAGNOSIS — E1142 Type 2 diabetes mellitus with diabetic polyneuropathy: Secondary | ICD-10-CM | POA: Diagnosis not present

## 2022-04-02 NOTE — Progress Notes (Signed)
Cardiology Office Note:   Date:  04/05/2022  ID:  Mea, Crocker [birth year], MRN [id number]  History of Present Illness:   ZYIANNA MACRINA is a 67 y.o. 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    She had been previously followed by Dr. [name] for her cardiology care.The patient was noted to develop progression of aortic insufficiency and aortic stenosis. Echo 11/15/20 showed EF 70%, and severe aortic valvular disease consisting of moderate to severe aortic stenosis with AVA 0.78cm2, with a mean gradient of 28 mmHg and severe aortic insufficiency. She was referred for cardiac catheterization which demonstrated mild obstructive coronary artery disease, elevated wedge pressure of 24 mmHg with V waves to 40 mmHg and an LVEDP of 32 mmHg.    She was evaluated by the multidisciplinary valve team and underwent successful TAVR with a 26 mm Edwards Sapien 3 Ultra Resilia THV via the TF approach on 12/26/20. Post operative echo showed EF 70%, normally functioning TAVR with a mean gradient of 17.7 mmHg and no PVL. BP was elevated and she was started on a nitro gtt. She was resumed on home medications Losartan 100mg  daily, Norvasc 10mg  daily, and hydralazine 25mg  BID, which was increased to 50mg  TID. Given marked sinus bradycardia after TAVR home Toprol XL was discontinued. She was discharged on a baby aspirin.    She then presented to the ER on 12/30/20 and was found to be in rapid afib and started on Eliquis and given PRN diltiazem 30 mg PO Q6hr PRN palpitations. At follow up Toprol XL was resumed. 1 month echo showed EF 60%, normally functioning TAVR with a mean gradient of 11 mm hg and trivial PVL. She has done well in follow up with no further admissions.  Was last seen in clinic on 12/2021 by Nell Range where she was doing well from  a CV standpoint.   Today, the patient overall feels okay. She has LE edema during the day that resolves with elevation. No SOB, orthopnea, PND, or chest pain. Rare palpitations. Tolerating apixaban as prescribed with no bleeding issues.   Has not monitored blood pressure at home.   ROS: As per HPI  Studies Reviewed:    EKG:  NSR, HR 71bpm-personally reviewed  Cardiac Studies & Procedures   CARDIAC CATHETERIZATION  CARDIAC CATHETERIZATION 11/24/2020  Narrative Mildly dilated ascending aorta. Moderately severe aortic regurgitation.  Mild aortic stenosis. Moderately severe elevation in left ventricular end-diastolic pressure, 32 mmHg. Elevated mean pulmonary wedge pressure, 24 mmHg.  V wave 40 mmHg. Mild pulmonary hypertension with PA mean 31 mmHg and phasic pressure 54 over 17 mmHg. Widely patent coronary arteries without anomalous origin.  RECOMMENDATION:  Better blood pressure control. Surgical consultation for comanagement.  Patient is not limited by symptoms but is quite sedentary. Echocardiography suggests severe aortic regurgitation although this is not borne out by angiography.  Findings Coronary Findings Diagnostic  Dominance: Right  Right Coronary Artery Ost RCA lesion is 25% stenosed.  Intervention  No interventions have been documented.     ECHOCARDIOGRAM  ECHOCARDIOGRAM COMPLETE 12/26/2021  Narrative ECHOCARDIOGRAM REPORT    Patient Name:   KAEDEN DUCE Date of Exam: 12/26/2021 Medical Rec #:  [id number]        Height:       70.5 in Accession #:    [id number]  Weight:       362.3 lb Date of Birth:  1955/04/04       BSA:          2.701 m Patient Age:    42 years         BP:           158/80 mmHg Patient Gender: F                HR:           71 bpm. Exam Location:  [address]  Procedure: 2D Echo, Cardiac Doppler and Color Doppler  Indications:     Z95.2 Status post TAVR  History:         Patient has prior history of Echocardiogram  examinations, most recent 02/02/2021. TAVR-44mm S3UR, COPD; Risk Factors:Hypertension and Diabetes. Chronic kidney disease. Palpitations.  Sonographer:     Cresenciano Lick RDCS Referring Phys:  [id number] Eileen Stanford Diagnosing Phys: Fransico Him MD  IMPRESSIONS   1. Left ventricular ejection fraction, by estimation, is 60 to 65%. The left ventricle has normal function. The left ventricle has no regional wall motion abnormalities. There is severe concentric left ventricular hypertrophy. Left ventricular diastolic parameters are indeterminate. Elevated left ventricular end-diastolic pressure. 2. Right ventricular systolic function is normal. The right ventricular size is normal. Tricuspid regurgitation signal is inadequate for assessing PA pressure. 3. The mitral valve is normal in structure. Mild mitral valve regurgitation. No evidence of mitral stenosis. 4. The aortic valve has been repaired/replaced. There is a 26 mm Ultra, stented (TAVR) valve present in the aortic position. Aortic valve regurgitation is not visualized. No aortic stenosis is present. Aortic valve mean gradient measures 12.0 mmHg. Aortic valve peak gradient measures 20.5 mmHg. DVI 0.57 and VMax 2.35m/s. 5. Aortic dilatation noted. There is mild dilatation of the ascending aorta, measuring 40 mm. 6. The inferior vena cava is normal in size with greater than 50% respiratory variability, suggesting right atrial pressure of 3 mmHg.  FINDINGS Left Ventricle: Left ventricular ejection fraction, by estimation, is 60 to 65%. The left ventricle has normal function. The left ventricle has no regional wall motion abnormalities. The left ventricular internal cavity size was normal in size. There is severe concentric left ventricular hypertrophy. Left ventricular diastolic parameters are indeterminate. Elevated left ventricular end-diastolic pressure.  Right Ventricle: The right ventricular size is normal. No increase in  right ventricular wall thickness. Right ventricular systolic function is normal. Tricuspid regurgitation signal is inadequate for assessing PA pressure.  Left Atrium: Left atrial size was normal in size.  Right Atrium: Right atrial size was normal in size.  Pericardium: There is no evidence of pericardial effusion.  Mitral Valve: The mitral valve is normal in structure. Mild mitral annular calcification. Mild mitral valve regurgitation. No evidence of mitral valve stenosis.  Tricuspid Valve: The tricuspid valve is normal in structure. Tricuspid valve regurgitation is not demonstrated. No evidence of tricuspid stenosis.  Aortic Valve: Aortic Valve: The aortic valve has been repaired/replaced. Aortic valve regurgitation is not visualized. No aortic stenosis is present. Aortic valve mean gradient measures 12.0 mmHg. Aortic valve peak gradient measures 20.5 mmHg. There is a 26 mm Ultra, stented (TAVR) valve present in the aortic position.  Pulmonic Valve: The pulmonic valve was normal in structure. Pulmonic valve regurgitation is not visualized. No evidence of pulmonic stenosis.  Aorta: Aortic dilatation noted. There is mild dilatation of the ascending aorta, measuring 40 mm.  Venous: The inferior vena cava is  normal in size with greater than 50% respiratory variability, suggesting right atrial pressure of 3 mmHg.  IAS/Shunts: No atrial level shunt detected by color flow Doppler.   LEFT VENTRICLE PLAX 2D LVIDd:         4.20 cm Diastology LVIDs:         2.50 cm LV e' medial:    5.33 cm/s LV PW:         1.50 cm LV E/e' medial:  19.2 LV IVS:        1.50 cm LV e' lateral:   6.85 cm/s LV E/e' lateral: 15.0   RIGHT VENTRICLE             IVC RV Basal diam:  3.80 cm     IVC diam: 1.20 cm RV S prime:     15.50 cm/s TAPSE (M-mode): 1.7 cm  LEFT ATRIUM             Index        RIGHT ATRIUM           Index LA diam:        5.60 cm 2.07 cm/m   RA Area:     12.00 cm LA Vol (A2C):   72.0 ml  26.66 ml/m  RA Volume:   29.00 ml  10.74 ml/m LA Vol (A4C):   52.1 ml 19.29 ml/m LA Biplane Vol: 63.6 ml 23.55 ml/m AORTIC VALVE AV Vmax:           226.60 cm/s AV Vmean:          162.000 cm/s AV VTI:            0.540 m AV Peak Grad:      20.5 mmHg AV Mean Grad:      12.0 mmHg LVOT Vmax:         126.00 cm/s LVOT Vmean:        85.500 cm/s LVOT VTI:          0.310 m LVOT/AV VTI ratio: 0.57  AORTA Ao Root diam: 3.40 cm Ao Asc diam:  4.00 cm  MITRAL VALVE MV Area (PHT): 3.68 cm     SHUNTS MV Decel Time: 206 msec     Systemic VTI: 0.31 m MV E velocity: 102.50 cm/s MV A velocity: 96.15 cm/s MV E/A ratio:  1.07  Fransico Him MD Electronically signed by Fransico Him MD Signature Date/Time: 12/26/2021/2:33:12 PM    Final (Updated)    [facility] (3-14 DAYS) 01/20/2021  Narrative  Paroxysmal atrial fibrillation with RVR. Burden 4%  Otherwise NSR with occational PAC's, PVC's, and SVT.  PVC and PAC burden < 1%   Patch Wear Time:  14 days and 0 hours (2022-12-14T12:16:42-0500 to 2022-12-28T12:16:42-0500)  Patient had a min HR of 40 bpm, max HR of 195 bpm, and avg HR of 71 bpm. Predominant underlying rhythm was Sinus Rhythm. 1 run of Ventricular Tachycardia occurred lasting 4 beats with a max rate of 169 bpm (avg 164 bpm). 36 Supraventricular Tachycardia runs occurred, the run with the fastest interval lasting 4 beats with a max rate of 188 bpm, the longest lasting 11.9 secs with an avg rate of 116 bpm. Atrial Fibrillation occurred (4% burden), ranging from 57-195 bpm (avg of 107 bpm), the longest lasting 7 hours 13 mins with an avg rate of 100 bpm. Isolated SVEs were rare (<1.0%), SVE Couplets were rare (<1.0%), and SVE Triplets were rare (<1.0%). Isolated VEs were rare (<1.0%, 2949), VE Couplets  were rare (<1.0%, 91), and VE Triplets were rare (<1.0%, 1). Ventricular Bigeminy and Trigeminy were present. Previously Notified: MD notification  criteria for First Documentation of Atrial Fibrillation met - report posted prior to notification per account request (CL).   CT SCANS  CT CORONARY MORPH W/CTA COR W/SCORE 12/14/2020  Addendum 12/14/2020  2:12 PM ADDENDUM REPORT: 12/14/2020 14:09  CLINICAL DATA:  Aortic stenosis  EXAM: Cardiac TAVR CT  TECHNIQUE: The patient was scanned on a Siemens Force [id number] slice scanner. A 120 kV retrospective scan was triggered in the descending thoracic aorta at 111 HU's. Gantry rotation speed was 270 msecs and collimation was .9 mm. No beta blockade or nitro were given. The 3D data set was reconstructed in 5% intervals of the R-R cycle. Systolic and diastolic phases were analyzed on a dedicated work station using MPR, MIP and VRT modes. The patient received 80 cc of contrast.  FINDINGS: Aortic Valve: Tri leaflet calcified valve with restricted leaflet motion calcium score 1835  Aorta: No aneurysm normal arch vessels mild calcific atherosclerosis  Sinotubular Junction: 30 mm  Ascending Thoracic Aorta: 37 mm  Aortic Arch: 31 mm  Descending Thoracic Aorta: 27 mm  Sinus of Valsalva Measurements:  Non-coronary: 30.4 mm  Right - coronary: 28.2 mm  Left - coronary: 29 mm  Coronary Artery Height above Annulus:  Left Main: 13.1 mm above annulus  Right Coronary: 16 mm above annulus  Virtual Basal Annulus Measurements:  Maximum/Minimum Diameter: 26.3 mm x 21.1 mm  Perimeter: 80.4 mm  Area: 479 mm2  Coronary Arteries: Sufficient height above annulus for deployment  Optimum Fluoroscopic Angle for Delivery: LAO 10 Cranial 4 degrees  IMPRESSION: Poor quality study.  Poor opacification of aorta  1.  Calcified tri leaflet AV with calcium score 1835  2.  Annular area of 479 mm2 suitable for a 26 mm Sapien 3 valve  3.  Coronary arteries sufficient height above annulus for deployment  4. Optimum angiographic angle for deployment LAO 10 cranial 4 degrees  Jenkins Rouge   Electronically Signed By: Jenkins Rouge M.D. On: 12/14/2020 14:09  Narrative EXAM: OVER-READ INTERPRETATION  CT CHEST  The following report is an over-read performed by radiologist Dr. Vinnie Langton of [facility], [place] on 12/14/2020. This over-read does not include interpretation of cardiac or coronary anatomy or pathology. The coronary calcium score/coronary CTA interpretation by the cardiologist is attached.  COMPARISON:  None.  FINDINGS: Extracardiac findings will be described separately under dictation for contemporaneously obtained CTA chest, abdomen and pelvis.  IMPRESSION: Please see separate dictation for contemporaneously obtained CTA chest, abdomen and pelvis dated 12/14/2020 for full description of relevant extracardiac findings.  Electronically Signed: By: Vinnie Langton M.D. On: 12/14/2020 11:47          Risk Assessment/Calculations:    CHA2DS2-VASc Score = 3  This indicates a 3.2% annual risk of stroke. The patient's score is based upon: CHF History: 0 HTN History: 1 Diabetes History: 0 Stroke History: 0 Vascular Disease History: 0 Age Score: 1 Gender Score: 1         Physical Exam:   VS:  BP 139/87   Pulse 71   Ht 5\' 10"  (1.778 m)   Wt (!) 361 lb (163.7 kg)   SpO2 93%   BMI 51.80 kg/m    Wt Readings from Last 3 Encounters:  04/05/22 (!) 361 lb (163.7 kg)  03/25/22 (!) 360 lb (163.3 kg)  03/14/22 (!) 360 lb (163.3 kg)  GEN: Well nourished, well developed in no acute distress NECK: No JVD; No carotid bruits CARDIAC: RRR, 2/6 systolic murmur RESPIRATORY:  Clear to auscultation without rales, wheezing or rhonchi  ABDOMEN: Soft, non-tender, non-distended EXTREMITIES:  No edema; No deformity   ASSESSMENT AND PLAN:    #Severe AS/AI s/p TAVR:  -TTE 12/2021 EF 60-65%, severe LVH, normally functioning TAVR with a mean gradient of 12 mm hg and no PVL -Doing well with no HF or anginal symptoms -Continue amoxicillin  for SBE prophylaxis -Not on ASA due to need for apixiban    #HTN:  -Elevated initially but improved to 130s on recheck -Continue hydralazine 50mg  TID -Continue Norvasc 10mg  daily -Continue Losartan 100mg  daily -Continue Toprol XL 25mg  daily -Increase spiro to 12.5mg  daily -Monitor BP at home   #Morbid obesity:  Body mass index is 51.89 kg/m. Recently failed Ozempic and now considering bariatric surgery. She would be cleared from a cardiac perspective if she decides to pursue this.    #PAF:  -No significant palpitations and in NSR today -Continue on Toprol XL and PRN dilt -Continue Elqiuis 5mg  BID   #HLD: -Continue lipitor 10mg  daily -LDL 72 (02/2022) -Cath with clean coronaries        Signed, Freada Bergeron, MD

## 2022-04-03 ENCOUNTER — Telehealth: Payer: Self-pay | Admitting: Family Medicine

## 2022-04-03 ENCOUNTER — Telehealth: Payer: Self-pay

## 2022-04-03 NOTE — Telephone Encounter (Signed)
Patient dropped off document  medical forms , to be filled out by provider. Patient requested to send it via Call Patient to pick up within 5-days. Document is located in providers tray at front office.Please advise at Mobile [phone] (mobile)

## 2022-04-03 NOTE — Progress Notes (Signed)
  Chronic Care Management   Note  04/03/2022 Name: KARINDA VIGNE MRN: [id number] DOB: 11-14-1955  MCKENIZE COMINS is a 67 y.o. year old female who is a primary care patient of Libby Maw, MD. I reached out to Mariann Barter by phone today in response to a referral sent by Ms. [place] PCP.  Ms. Bonno was given information about Chronic Care Management services today including:  CCM service includes personalized support from designated clinical staff supervised by the physician, including individualized plan of care and coordination with other care providers 24/7 contact phone numbers for assistance for urgent and routine care needs. Service will only be billed when office clinical staff spend 20 minutes or more in a month to coordinate care. Only one practitioner may furnish and bill the service in a calendar month. The patient may stop CCM services at amy time (effective at the end of the month) by phone call to the office staff. The patient will be responsible for cost sharing (co-pay) or up to 20% of the service fee (after annual deductible is met)  Ms. Mariann Barter  agreedto scheduling an appointment with the CCM RN Case Manager   Follow up plan: Patient agreed to scheduled appointment with RN Case Manager on 04/08/2022 and Pharm d 04/10/2022(date/time).   Noreene Larsson, [place], [place] [address] Direct Dial: [phone] [email or web address]

## 2022-04-03 NOTE — Telephone Encounter (Signed)
Patient have transferred from Dr. Ethelene Hal to Dr. [place] Silos. Please advise message below

## 2022-04-04 NOTE — Telephone Encounter (Signed)
Form faxed successfully and patient made aware. Copy placed up front for pick up

## 2022-04-05 ENCOUNTER — Other Ambulatory Visit (HOSPITAL_COMMUNITY): Payer: Self-pay

## 2022-04-05 ENCOUNTER — Other Ambulatory Visit: Payer: Self-pay

## 2022-04-05 ENCOUNTER — Ambulatory Visit: Payer: HMO | Attending: Cardiology | Admitting: Cardiology

## 2022-04-05 ENCOUNTER — Encounter: Payer: Self-pay | Admitting: Cardiology

## 2022-04-05 DIAGNOSIS — I10 Essential (primary) hypertension: Secondary | ICD-10-CM | POA: Diagnosis not present

## 2022-04-05 DIAGNOSIS — I350 Nonrheumatic aortic (valve) stenosis: Secondary | ICD-10-CM

## 2022-04-05 DIAGNOSIS — Z79899 Other long term (current) drug therapy: Secondary | ICD-10-CM

## 2022-04-05 DIAGNOSIS — I351 Nonrheumatic aortic (valve) insufficiency: Secondary | ICD-10-CM | POA: Diagnosis not present

## 2022-04-05 DIAGNOSIS — Z952 Presence of prosthetic heart valve: Secondary | ICD-10-CM | POA: Diagnosis not present

## 2022-04-05 DIAGNOSIS — Z7901 Long term (current) use of anticoagulants: Secondary | ICD-10-CM

## 2022-04-05 DIAGNOSIS — E782 Mixed hyperlipidemia: Secondary | ICD-10-CM

## 2022-04-05 MED ORDER — SPIRONOLACTONE 25 MG PO TABS
25.0000 mg | ORAL_TABLET | Freq: Every day | ORAL | 2 refills | Status: DC
  Filled 2022-04-05: qty 90, 90d supply, fill #0
  Filled 2022-06-25: qty 90, 90d supply, fill #1
  Filled 2022-09-19: qty 90, 90d supply, fill #2

## 2022-04-05 MED ORDER — FUROSEMIDE 20 MG PO TABS
20.0000 mg | ORAL_TABLET | Freq: Every day | ORAL | 4 refills | Status: DC | PRN
  Filled 2022-04-05: qty 30, 30d supply, fill #0
  Filled 2022-07-24: qty 30, 30d supply, fill #1
  Filled 2022-11-16: qty 30, 30d supply, fill #2
  Filled 2023-01-06: qty 30, 30d supply, fill #3

## 2022-04-05 NOTE — Patient Instructions (Signed)
Medication Instructions:   INCREASE YOUR SPIRONOLACTONE TO 25 MG BY MOUTH DAILY  START TAKING LASIX 20 MG BY MOUTH DAILY AS NEEDED FOR LOWER EXTREMITY SWELLING  *If you need a refill on your cardiac medications before your next appointment, please call your pharmacy*   Lab Work:  [place] OFFICE--BMET  If you have labs (blood work) drawn today and your tests are completely normal, you will receive your results only by: [place] (if you have MyChart) OR A paper copy in the mail If you have any lab test that is abnormal or we need to change your treatment, we will call you to review the results.    Follow-Up: At [facility], you and your health needs are our priority.  As part of our continuing mission to provide you with exceptional heart care, we have created designated Provider Care Teams.  These Care Teams include your primary Cardiologist (physician) and Advanced Practice Providers (APPs -  Physician Assistants and Nurse Practitioners) who all work together to provide you with the care you need, when you need it.  We recommend signing up for the patient portal called "MyChart".  Sign up information is provided on this After Visit Summary.  MyChart is used to connect with patients for Virtual Visits (Telemedicine).  Patients are able to view lab/test results, encounter notes, upcoming appointments, etc.  Non-urgent messages can be sent to your provider as well.   To learn more about what you can do with MyChart, go to [email or web address].    Your next appointment:   7 month(s)  Provider:   DR. Johney Frame

## 2022-04-08 ENCOUNTER — Ambulatory Visit (INDEPENDENT_AMBULATORY_CARE_PROVIDER_SITE_OTHER): Payer: HMO

## 2022-04-08 ENCOUNTER — Telehealth: Payer: HMO

## 2022-04-08 DIAGNOSIS — I480 Paroxysmal atrial fibrillation: Secondary | ICD-10-CM

## 2022-04-08 DIAGNOSIS — E1165 Type 2 diabetes mellitus with hyperglycemia: Secondary | ICD-10-CM

## 2022-04-08 NOTE — Chronic Care Management (AMB) (Signed)
Chronic Care Management   CCM RN Visit Note  04/08/2022 Name: Rose Miller MRN: [id number] DOB: 01-05-1956  Subjective: Rose Miller is a 67 y.o. year old female who is a primary care patient of Bonnita Hollow, MD. The patient was referred to the Chronic Care Management team for assistance with care management needs subsequent to provider initiation of CCM services and plan of care.    Today's Visit:  Engaged with patient by telephone for initial visit.     SDOH Interventions Today    Flowsheet Row Most Recent Value  SDOH Interventions   Food Insecurity Interventions Intervention Not Indicated  Housing Interventions Intervention Not Indicated  Transportation Interventions Intervention Not Indicated  Utilities Interventions Intervention Not Indicated  Alcohol Usage Interventions Intervention Not Indicated (Score <7)  Financial Strain Interventions Intervention Not Indicated, Other (Comment)  [patient is interested in speaking to the pharm D for help with medications cost- upcoming appointment]  Physical Activity Interventions Other (Comments)  [the patient does not do structured activity, will send information in my chart]  Stress Interventions Intervention Not Indicated  Social Connections Interventions Intervention Not Indicated         Goals Addressed             This Visit's Progress    CCM Expected Outcome:  Monitor, Self-Manage and Reduce Symptoms of Afib       Current Barriers:  Chronic Disease Management support and education needs related to effective management of AFIB  Planned Interventions: Provider order and care plan reviewed. Collaborated with PharmD regarding patient care and plan. Counseled on increased risk of stroke due to Afib and benefits of anticoagulation for stroke prevention           Reviewed importance of adherence to anticoagulant exactly as prescribed. The patient takes Eliquis 5 mg BID. States compliance with medications. Has  upcoming appointment with the pharm D for medications support.  Advised patient to discuss changes in her AFIB, questions or concerns related to AFIB or heart health with provider Counseled on bleeding risk associated with AFIB and importance of self-monitoring for signs/symptoms of bleeding Counseled on avoidance of NSAIDs due to increased bleeding risk with anticoagulants Counseled on importance of regular laboratory monitoring as prescribed Counseled on seeking medical attention after a head injury or if there is blood in the urine/stool Afib action plan reviewed. The patient saw the cardiologist on 04-05-2022. No changes in the current plan of care. She states she is doing well and has medications to take if needed for her AFIB Screening for signs and symptoms of depression related to chronic disease state Assessed social determinant of health barriers  Symptom Management: Take medications as prescribed   Attend all scheduled provider appointments Call provider office for new concerns or questions  call the Suicide and Crisis Lifeline: 988 call the [place] National Suicide Prevention Lifeline: 317-109-2276 or TTY: 249-875-8900 TTY (816) 541-7910) to talk to a trained counselor call 1-800-273-TALK (toll free, 24 hour hotline) go to [facility] [address] [place] [phone]) if experiencing a Mental Health or [place]  - make a plan to eat healthy - keep all lab appointments - take medicine as prescribed  Follow Up Plan: Telephone follow up appointment with care management team member scheduled for: 06-17-2022 at 230 pm       CCM Expected Outcome:  Monitor, Self-Manage and Reduce Symptoms of Diabetes       Current Barriers:  Care Coordination needs  related to medication cost of DM medications with pharm D referral for 04-10-2022 in a patient with DM Chronic Disease Management support and education needs related to effective  management of DM Financial Constraints.   Planned Interventions: Provided education to patient about basic DM disease process; Reviewed medications with patient and discussed importance of medication adherence;        Reviewed prescribed diet with patient heart healthy/ADA diet ; Counseled on importance of regular laboratory monitoring as prescribed;        Discussed plans with patient for ongoing care management follow up and provided patient with direct contact information for care management team;      Provided patient with written educational materials related to hypo and hyperglycemia and importance of correct treatment. The patient admits she does not check her blood sugars. Reviewed the sx and sx of hypoglycemia and hyperglycemia with the patient. Also will attach information to her my chart for educational support;       Reviewed scheduled/upcoming provider appointments including: 06-12-2022 at 10 am;         Advised patient, providing education and rationale, to check cbg when you have symptoms of low or high blood sugar and as directed by provider  and record. Does not check her blood sugars on a regular basis. A1C is stable.        call provider for findings outside established parameters;       Referral made to pharmacy team for assistance with help with medication cost and effective management of DM;       Review of patient status, including review of consultants reports, relevant laboratory and other test results, and medications completed;       Advised patient to discuss changes in her DM, questions or concerns with provider;      Screening for signs and symptoms of depression related to chronic disease state;        Assessed social determinant of health barriers;         Symptom Management: Take medications as prescribed   Attend all scheduled provider appointments Call provider office for new concerns or questions  call the Suicide and Crisis Lifeline: 988 call the [place]  National Suicide Prevention Lifeline: (903) 064-9418 or TTY: 623-056-6711 TTY 254 540 7929) to talk to a trained counselor call 1-800-273-TALK (toll free, 24 hour hotline) go to [facility] [address] [place] [phone]) if experiencing a [place] or [place]  check feet daily for cuts, sores or redness trim toenails straight across manage portion size wash and dry feet carefully every day wear comfortable, cotton socks wear comfortable, well-fitting shoes  Follow Up Plan: Telephone follow up appointment with care management team member scheduled for: 06-17-2022 at 230 pm          Plan:Telephone follow up appointment with care management team member scheduled for:  06-17-2022 at 91 pm  [place], MSN, CCM RN Care Manager  Chronic Care Management Direct Number: [phone]

## 2022-04-08 NOTE — Patient Instructions (Addendum)
Please call the care guide team at [phone] if you need to cancel or reschedule your appointment.   If you are experiencing a Mental Health or [place] or need someone to talk to, please call the Suicide and Crisis Lifeline: 988 call the [place] National Suicide Prevention Lifeline: 314 382 5100 or TTY: 519-581-5539 TTY 6461519470) to talk to a trained counselor call 1-800-273-TALK (toll free, 24 hour hotline) go to [facility] [place] [phone])   Following is a copy of the CCM Program Consent:  CCM service includes personalized support from designated clinical staff supervised by the physician, including individualized plan of care and coordination with other care providers 24/7 contact phone numbers for assistance for urgent and routine care needs. Service will only be billed when office clinical staff spend 20 minutes or more in a month to coordinate care. Only one practitioner may furnish and bill the service in a calendar month. The patient may stop CCM services at amy time (effective at the end of the month) by phone call to the office staff. The patient will be responsible for cost sharing (co-pay) or up to 20% of the service fee (after annual deductible is met)  Following is a copy of your full provider care plan:   Goals Addressed             This Visit's Progress    CCM Expected Outcome:  Monitor, Self-Manage and Reduce Symptoms of Afib       Current Barriers:  Chronic Disease Management support and education needs related to effective management of AFIB  Planned Interventions: Provider order and care plan reviewed. Collaborated with PharmD regarding patient care and plan. Counseled on increased risk of stroke due to Afib and benefits of anticoagulation for stroke prevention           Reviewed importance of adherence to anticoagulant exactly as prescribed. The patient takes Eliquis 5 mg BID.  States compliance with medications. Has upcoming appointment with the pharm D for medications support.  Advised patient to discuss changes in her AFIB, questions or concerns related to AFIB or heart health with provider Counseled on bleeding risk associated with AFIB and importance of self-monitoring for signs/symptoms of bleeding Counseled on avoidance of NSAIDs due to increased bleeding risk with anticoagulants Counseled on importance of regular laboratory monitoring as prescribed Counseled on seeking medical attention after a head injury or if there is blood in the urine/stool Afib action plan reviewed. The patient saw the cardiologist on 04-05-2022. No changes in the current plan of care. She states she is doing well and has medications to take if needed for her AFIB Screening for signs and symptoms of depression related to chronic disease state Assessed social determinant of health barriers  Symptom Management: Take medications as prescribed   Attend all scheduled provider appointments Call provider office for new concerns or questions  call the Suicide and Crisis Lifeline: 988 call the [place] National Suicide Prevention Lifeline: (202) 189-1863 or TTY: 848-333-3187 TTY 530-018-2107) to talk to a trained counselor call 1-800-273-TALK (toll free, 24 hour hotline) go to [facility] [address] [place] [phone]) if experiencing a Mental Health or [place]  - make a plan to eat healthy - keep all lab appointments - take medicine as prescribed  Follow Up Plan: Telephone follow up appointment with care management team member scheduled for: 06-17-2022 at 230 pm       CCM Expected Outcome:  Monitor, Self-Manage and Reduce Symptoms of Diabetes       Current Barriers:  Care Coordination needs related to medication cost of DM medications with pharm D referral for 04-10-2022 in a patient with DM Chronic Disease Management support and  education needs related to effective management of DM Financial Constraints.   Planned Interventions: Provided education to patient about basic DM disease process; Reviewed medications with patient and discussed importance of medication adherence;        Reviewed prescribed diet with patient heart healthy/ADA diet ; Counseled on importance of regular laboratory monitoring as prescribed;        Discussed plans with patient for ongoing care management follow up and provided patient with direct contact information for care management team;      Provided patient with written educational materials related to hypo and hyperglycemia and importance of correct treatment. The patient admits she does not check her blood sugars. Reviewed the sx and sx of hypoglycemia and hyperglycemia with the patient. Also will attach information to her my chart for educational support;       Reviewed scheduled/upcoming provider appointments including: 06-12-2022 at 10 am;         Advised patient, providing education and rationale, to check cbg when you have symptoms of low or high blood sugar and as directed by provider  and record. Does not check her blood sugars on a regular basis. A1C is stable.        call provider for findings outside established parameters;       Referral made to pharmacy team for assistance with help with medication cost and effective management of DM;       Review of patient status, including review of consultants reports, relevant laboratory and other test results, and medications completed;       Advised patient to discuss changes in her DM, questions or concerns with provider;      Screening for signs and symptoms of depression related to chronic disease state;        Assessed social determinant of health barriers;         Symptom Management: Take medications as prescribed   Attend all scheduled provider appointments Call provider office for new concerns or questions  call the Suicide and  Crisis Lifeline: 988 call the [place] National Suicide Prevention Lifeline: (620)865-3963 or TTY: 774-100-4646 TTY (571)488-8203) to talk to a trained counselor call 1-800-273-TALK (toll free, 24 hour hotline) go to [facility] [address] [place] [phone]) if experiencing a [place] or [place]  check feet daily for cuts, sores or redness trim toenails straight across manage portion size wash and dry feet carefully every day wear comfortable, cotton socks wear comfortable, well-fitting shoes  Follow Up Plan: Telephone follow up appointment with care management team member scheduled for: 06-17-2022 at 230 pm          Patient verbalizes understanding of instructions and care plan provided today and agrees to view in [place]. Active MyChart status and patient understanding of how to access instructions and care plan via MyChart confirmed with patient.     Telephone follow up appointment with care management team member scheduled for: 06-17-2022 at 230 pm   Hypoglycemia Hypoglycemia is when the sugar (glucose) level in your blood is too low. Low blood sugar can happen to people who have diabetes and people who do not have diabetes. Low blood sugar can happen quickly, and it can be an emergency.  What are the causes? This condition happens most often in people who have diabetes. It may be caused by: Diabetes medicine. Not eating enough, or not eating often enough. Doing more physical activity. Drinking alcohol on an empty stomach. If you do not have diabetes, this condition may be caused by: A tumor in the pancreas. Not eating enough, or not eating for long periods at a time (fasting). A very bad infection or illness. Problems after having weight loss (bariatric) surgery. Kidney failure or liver failure. Certain medicines. What increases the risk? This condition is more likely to develop in people who: Have diabetes and  take medicines to lower their blood sugar. Abuse alcohol. Have a very bad illness. What are the signs or symptoms? Mild Hunger. Sweating and feeling clammy. Feeling dizzy or light-headed. Being sleepy or having trouble sleeping. Feeling like you may vomit (nauseous). A fast heartbeat. A headache. Blurry vision. Mood changes, such as: Being grouchy. Feeling worried or nervous (anxious). Tingling or loss of feeling (numbness) around your mouth, lips, or tongue. Moderate Confusion and poor judgment. Behavior changes. Weakness. Uneven heartbeat. Trouble with moving (coordination). Very low Very low blood sugar (severe hypoglycemia) is a medical emergency. It can cause: Fainting. Seizures. Loss of consciousness (coma). Death. How is this treated? Treating low blood sugar Low blood sugar is often treated by eating or drinking something that has sugar in it right away. The food or drink should contain 15 grams of a fast-acting carb (carbohydrate). Options include: 4 oz (120 mL) of fruit juice. 4 oz (120 mL) of regular soda (not diet soda). A few pieces of hard candy. Check food labels to see how many pieces to eat for 15 grams. 1 Tbsp (15 mL) of sugar or honey. 4 glucose tablets. 1 tube of glucose gel. Treating low blood sugar if you have diabetes If you can think clearly and swallow safely, follow the 15:15 rule: Take 15 grams of a fast-acting carb. Talk with your doctor about how much you should take. Always keep a source of fast-acting carb with you, such as: Glucose tablets (take 4 tablets). A few pieces of hard candy. Check food labels to see how many pieces to eat for 15 grams. 4 oz (120 mL) of fruit juice. 4 oz (120 mL) of regular soda (not diet soda). 1 Tbsp (15 mL) of honey or sugar. 1 tube of glucose gel. Check your blood sugar 15 minutes after you take the carb. If your blood sugar is still at or below 70 mg/dL (3.9 mmol/L), take 15 grams of a carb again. If your  blood sugar does not go above 70 mg/dL (3.9 mmol/L) after 3 tries, get help right away. After your blood sugar goes back to normal, eat a meal or a snack within 1 hour.  Treating very low blood sugar If your blood sugar is below 54 mg/dL (3 mmol/L), you have very low blood sugar, or severe hypoglycemia. This is an emergency. Get medical help right away. If you have very low blood sugar and you cannot eat or drink, you will need to be given a hormone called glucagon. A family member or friend should learn how to check your blood sugar and how to give you glucagon. Ask your doctor if you need to have an emergency glucagon kit at home. Very low blood sugar may also need to be treated in a hospital. Follow these instructions at home: General instructions Take over-the-counter and prescription medicines only as told by your doctor. Stay  aware of your blood sugar as told by your doctor. If you drink alcohol: Limit how much you have to: 0-1 drink a day for women who are not pregnant. 0-2 drinks a day for men. Know how much alcohol is in your drink. In the [place] one drink equals one 12 oz bottle of beer (355 mL), one 5 oz glass of wine (148 mL), or one 1 oz glass of hard liquor (44 mL). Be sure to eat food when you drink alcohol. Know that your body absorbs alcohol quickly. This may lead to low blood sugar later. Be sure to keep checking your blood sugar. Keep all follow-up visits. If you have diabetes:  Always have a fast-acting carb (15 grams) with you to treat low blood sugar. Follow your diabetes care plan as told by your doctor. Make sure you: Know the symptoms of low blood sugar. Check your blood sugar as often as told. Always check it before and after exercise. Always check your blood sugar before you drive. Take your medicines as told. Follow your meal plan. Eat on time. Do not skip meals. Share your diabetes care plan with: Your work or school. People you live with. Carry a card or  wear jewelry that says you have diabetes. Where to find more information American Diabetes Association: www.diabetes.org Contact a doctor if: You have trouble keeping your blood sugar in your target range. You have low blood sugar often. Get help right away if: You still have symptoms after you eat or drink something that contains 15 grams of fast-acting carb, and you cannot get your blood sugar above 70 mg/dL by following the 15:15 rule. Your blood sugar is below 54 mg/dL (3 mmol/L). You have a seizure. You faint. These symptoms may be an emergency. Get help right away. Call your local emergency services (911 in the [place] Do not wait to see if the symptoms will go away. Do not drive yourself to the hospital. Summary Hypoglycemia happens when the level of sugar (glucose) in your blood is too low. Low blood sugar can happen to people who have diabetes and people who do not have diabetes. Low blood sugar can happen quickly, and it can be an emergency. Make sure you know the symptoms of low blood sugar and know how to treat it. Always keep a source of sugar (fast-acting carb) with you to treat low blood sugar. This information is not intended to replace advice given to you by your health care provider. Make sure you discuss any questions you have with your health care provider. Document Revised: 12/02/2019 Document Reviewed: 12/02/2019 Elsevier Patient Education  [place]. Hyperglycemia Hyperglycemia is when the sugar (glucose) level in your blood is too high. High blood sugar can happen to people who have or do not have diabetes. High blood sugar can happen quickly. It can be an emergency. What are the causes? If you have diabetes, high blood sugar may be caused by: Medicines that increase blood sugar or affect your control of diabetes. Getting less physical activity. Overeating. Being sick or injured or having an infection. Having surgery. Stress. Not giving yourself enough  insulin (if you are taking it). You may have high blood sugar because you have diabetes that has not been diagnosed yet. If you do not have diabetes, high blood sugar may be caused by: Certain medicines. Stress. A bad illness. An infection. Having surgery. Diseases of the pancreas. What increases the risk? This condition is more likely to develop in people  who have risk factors for diabetes, such as: Having a family member with diabetes. Certain conditions in which the body's defense system (immune system) attacks itself. These are called autoimmune disorders. Being overweight. Not being active. Having a condition called insulin resistance. Having a history of: Prediabetes. Diabetes when pregnant. Polycystic ovarian syndrome (PCOS). What are the signs or symptoms? This condition may not cause symptoms. If you do have symptoms, they may include: Feeling more thirsty than normal. Needing to pee (urinate) more often than normal. Hunger. Feeling very tired. Blurry eyesight (vision). You may get other symptoms as the condition gets worse, such as: Dry mouth. Pain in your belly (abdomen). Not being hungry (loss of appetite). Breath that smells fruity. Weakness. Weight loss that is not planned. A tingling or numb feeling in your hands or feet. A headache. Cuts or bruises that heal slowly. How is this treated? Treatment depends on the cause of your condition. Treatment may include: Taking medicine to control your blood sugar levels. Changing your medicine or dosage if you take insulin or other diabetes medicines. Lifestyle changes. These may include: Exercising more. Eating healthier foods. Losing weight. Treating an illness or infection. Checking your blood sugar more often. Stopping or reducing steroid medicines. If your condition gets very bad, you will need to be treated in the hospital. Follow these instructions at home: General instructions Take over-the-counter and  prescription medicines only as told by your doctor. Do not smoke or use any products that contain nicotine or tobacco. If you need help quitting, ask your doctor. If you drink alcohol: Limit how much you have to: 0-1 drink a day for women who are not pregnant. 0-2 drinks a day for men. Know how much alcohol is in a drink. In the [place] one drink equals one 12 oz bottle of beer (355 mL), one 5 oz glass of wine (148 mL), or one 1 oz glass of hard liquor (44 mL). Manage stress. If you need help with this, ask your doctor. Do exercises as told by your doctor. Keep all follow-up visits. Eating and drinking  Stay at a healthy weight. Make sure you drink enough fluid when you: Exercise. Get sick. Are in hot temperatures. Drink enough fluid to keep your pee (urine) pale yellow. If you have diabetes:  Know the symptoms of high blood sugar. Follow your diabetes management plan as told by your doctor. Make sure you: Take insulin and medicines as told. Follow your exercise plan. Follow your meal plan. Eat on time. Do not skip meals. Check your blood sugar as often as told. Make sure you check before and after exercise. If you exercise longer or in a different way, check your blood sugar more often. Follow your sick day plan whenever you cannot eat or drink normally. Make this plan ahead of time with your doctor. Share your diabetes management plan with people in your workplace, school, and household. Check your pee for ketones when you are ill and as told by your doctor. Carry a card or wear jewelry that says that you have diabetes. Where to find more information American Diabetes Association: www.diabetes.org Contact a doctor if: Your blood sugar level is at or above 240 mg/dL (13.3 mmol/L) for 2 days in a row. You have problems keeping your blood sugar in your target range. You have high blood pressure often. You have signs of illness, such as: Feeling like you may vomit (feeling  nauseous). Vomiting. A fever. Get help right away if: Your blood  sugar monitor reads "high" even when you are taking insulin. You have trouble breathing. You have a change in how you think, feel, or act (mental status). You feel like you may vomit, and the feeling does not go away. You cannot stop vomiting. These symptoms may be an emergency. Get medical help right away. Call your local emergency services (911 in the [place] Do not wait to see if the symptoms will go away. Do not drive yourself to the hospital. Summary Hyperglycemia is when the sugar (glucose) level in your blood is too high. High blood sugar can happen to people who have or do not have diabetes. Make sure you drink enough fluids and follow your meal plan. Exercise as often as told by your doctor. Contact your doctor if you have problems keeping your blood sugar in your target range. This information is not intended to replace advice given to you by your health care provider. Make sure you discuss any questions you have with your health care provider. Document Revised: 10/15/2019 Document Reviewed: 10/15/2019 Elsevier Patient Education  [place]. Atrial Fibrillation Atrial fibrillation (AFib) is a type of irregular or rapid heartbeat (arrhythmia). In AFib, the top part of the heart (atria) beats in an irregular pattern. This makes the heart unable to pump blood normally and effectively. The goal of treatment is to prevent blood clots from forming, control your heart rate, or restore your heartbeat to a normal rhythm. If this condition is not treated, it can cause serious problems, such as a weakened heart muscle (cardiomyopathy) or a stroke. What are the causes? This condition is often caused by medical conditions that damage the heart's electrical system. These include: High blood pressure (hypertension). This is the most common cause. Certain heart problems or conditions, such as heart failure, coronary artery  disease, heart valve problems, or heart surgery. Diabetes. Overactive thyroid (hyperthyroidism). Chronic kidney disease. Certain lung conditions, such as emphysema, pneumonia, or COPD. Obstructive sleep apnea. In some cases, the cause of this condition is not known. What increases the risk? This condition is more likely to develop in: Older adults. Athletes who do endurance exercise. People who have a family history of AFib. Males. People who are Caucasian. People who are obese. People who smoke or misuse alcohol. What are the signs or symptoms? Symptoms of this condition include: Fast or irregular heartbeats (palpitations). Discomfort or pain in your chest. Shortness of breath. Sudden light-headedness or weakness. Tiring easily during exercise or activity. Syncope (fainting). Sweating. In some cases, there are no symptoms. How is this diagnosed? Your health care provider may detect AFib when taking your pulse. If detected, this condition may be diagnosed with: An electrocardiogram (ECG) to check electrical signals of the heart. An ambulatory cardiac monitor to record your heart's activity for a few days. A transthoracic echocardiogram (TTE) to create pictures of your heart. A transesophageal echocardiogram (TEE) to create even clearer pictures of your heart. A stress test to check your blood supply while you exercise. Imaging tests, such as a CT scan or chest X-ray. Blood tests. How is this treated? Treatment depends on underlying conditions and how you feel when you get AFib. This condition may be treated with: Medicines to prevent blood clots or to treat heart rate or heart rhythm problems. Electrical cardioversion to reset the heart's rhythm. A pacemaker to correct abnormal heart rhythm. Ablation to remove the heart tissue that sends abnormal signals. Left atrial appendage closure to seal the area where blood clots can form.  In some cases, underlying conditions will be  treated. Follow these instructions at home: Medicines Take over-the counter and prescription medicines only as told by your provider. Do not take any new medicines without talking to your provider. If you are taking blood thinners: Talk with your provider before taking aspirin or NSAIDs. These medicines can raise your risk of bleeding. Take your medicines as told. Take them at the same time each day. Do not do things that could hurt or bruise you. Be careful to avoid falls. Wear an alert bracelet or carry a card that says that you take blood thinners. Lifestyle Do not use any products that contain nicotine or tobacco. These products include cigarettes, chewing tobacco, and vaping devices, such as e-cigarettes. If you need help quitting, ask your provider. Eat heart-healthy foods. Talk with a food expert (dietitian) to make an eating plan that is right for you. Exercise regularly as told by your provider. Do not drink alcohol. Lose weight if you are overweight. General instructions If you have obstructive sleep apnea, manage your condition as told by your provider. Do not use diet pills unless your provider approves. Diet pills can make heart problems worse. Keep all follow-up visits. Your provider will want to check your heart rate and rhythm regularly. Contact a health care provider if: You notice a change in the rate, rhythm, or strength of your heartbeat. You are taking a blood thinner and you notice more bruising. You tire more easily when you exercise or do heavy work. You have a sudden change in weight. Get help right away if:  You have chest pain. You have trouble breathing. You have side effects of blood thinners, such as blood in your vomit, poop (stool), or pee (urine), or bleeding that does not stop. You have any symptoms of a stroke. "BE FAST" is an easy way to remember the main warning signs of a stroke: B - Balance. Signs are dizziness, sudden trouble walking, or loss of  balance. E - Eyes. Signs are trouble seeing or a sudden change in vision. F - Face. Signs are sudden weakness or numbness of the face, or the face or eyelid drooping on one side. A - Arms. Signs are weakness or numbness in an arm. This happens suddenly and usually on one side of the body. S - Speech.Signs are sudden trouble speaking, slurred speech, or trouble understanding what people say. T - Time. Time to call emergency services. Write down what time symptoms started. Other signs of a stroke, such as: A sudden, severe headache with no known cause. Nausea or vomiting. Seizure. These symptoms may be an emergency. Get help right away. Call 911. Do not wait to see if the symptoms will go away. Do not drive yourself to the hospital. This information is not intended to replace advice given to you by your health care provider. Make sure you discuss any questions you have with your health care provider. Document Revised: 09/19/2021 Document Reviewed: 09/19/2021 Elsevier Patient Education  [place] for Aging Adults Staying physically active is important as you age. Physical activity and exercise can help in maintaining quality of life, health, physical function, and reducing falls. The four types of exercises that are best for older adults are endurance, strength, balance, and flexibility. Contact your health care provider before you start any exercise routine. Ask your health care provider what activities are safe for you. What are the risks? Risks associated with exercising include: Overdoing it. This may lead  to sore muscles or fatigue. Falls. Injuries. Dehydration. How to do these exercises Endurance exercises Endurance (aerobic) exercises raise your breathing rate and heart rate. Increasing your endurance helps you do everyday tasks and stay healthy. By improving the health of your body system that includes your heart, lungs, and blood vessels (circulatory  system), you may also delay or prevent diseases such as heart disease, diabetes, and weak bones (osteoporosis). Types of endurance exercises include: Sports. Indoor activities, such as using gym equipment, doing water aerobics, or dancing. Outdoor activities, such as biking or jogging. Tasks around the house, such as gardening, yard work, and heavy household chores like cleaning. Walking, such as hiking or walking around your neighborhood. When doing endurance exercises, make sure you: Are aware of your surroundings. Use safety equipment as directed. Dress in layers when exercising outdoors. Drink plenty of water to stay well hydrated. Build up endurance slowly. Start with 10 minutes at a time, and gradually build up to doing 30 minutes at a time. Unless your health care provider gave you different instructions, aim to exercise for a total of 150 minutes a week. Spread out that time so you are working on endurance 3 or more days a week. Strength exercises Lifting, pulling, or pushing weights helps to strengthen muscles. Having stronger muscles makes it easier to do everyday activities, such as getting up from a chair, climbing stairs, carrying groceries, and playing with grandchildren. Strength exercises include arm and leg exercises that may be done: With weights. Without weights (using your own body weight). With a resistance band. When doing strength exercises: Move smoothly and steadily. Do not suddenly thrust or jerk the weights, the resistance band, or your body. Start with no weights or with light weights, and gradually add more weight over time. Eventually, aim to use weights that are hard or very hard for you to lift. This means that you are able to do 8 repetitions with the weight, and the last few repetitions are very challenging. Lift or push weights into position for 3 seconds, hold the position for 1 second, and then take 3 seconds to return to your starting position. Breathe out  (exhale) during difficult movements, like lifting or pushing weights. Breathe in (inhale) to relax your muscles before the next repetition. Consider alternating arms or legs, especially when you first start strength exercises. Expect some slight muscle soreness after each session. Do strength exercises on 2 or more days a week, for 30 minutes at a time. Avoid exercising the same muscle groups two days in a row. For example, if you work on your leg muscles one day, work on your arm muscles the next day. When you can do two sets of 10-15 repetitions with a certain weight, increase the amount of weight. Balance exercises Balance exercises can help to prevent falls. Balance exercises include: Standing on one foot. Heel-to-toe walk. Balance walk. Tai chi. Make sure you have something sturdy to hold onto while doing balance exercises, such as a sturdy chair. As your balance improves, challenge yourself by holding on to the chair with one hand instead of two, and then with no hands. Trying exercises with your eyes closed also challenges your balance, but be sure to have a sturdy surface (like a countertop) close by in case you need it. Do balance exercises as often as you want, or as often as directed by your health care provider. Flexibility exercises  Flexibility exercises improve how far you can bend, straighten, move, or rotate parts  of your body (range of motion). These exercises also help you do everyday activities such as getting dressed or reaching for objects. Flexibility exercises include stretching different parts of the body, and they may be done in a standing or seated position or on the floor. When stretching, make sure you: Keep a slight bend in your arms and legs. Avoid completely straightening ("locking") your joints. Do not stretch so far that you feel pain. You should feel a mild stretching feeling. You may try stretching farther as you become more flexible over time. Relax and breathe  between stretches. Hold on to something sturdy for balance as needed. Hold each stretch for 10-30 seconds. Repeat each stretch 3-5 times. General safety tips Exercise in well-lit areas. Do not hold your breath during exercises or stretches. Warm up before exercising, and cool down after exercising. This can help prevent injury. Drink plenty of water during exercise or any activity that makes you sweat. If you are not sure if an exercise is safe for you, or you are not sure how to do an exercise, talk with your health care provider. This is especially important if you have had surgery on muscles, bones, or joints (orthopedic surgery). Where to find more information You can find more information about exercise for older adults from: Your local health department, fitness center, or community center. These facilities may have programs for aging adults. [employer] on Aging: http://kim-miller.com/ National Council on Aging: www.ncoa.org Summary Staying physically active is important as you age. Doing endurance, strength, balance, and flexibility exercises can help in maintaining quality of life, health, physical function, and reducing falls. Make sure to contact your health care provider before you start any exercise routine. Ask your health care provider what activities are safe for you. This information is not intended to replace advice given to you by your health care provider. Make sure you discuss any questions you have with your health care provider. Document Revised: 05/15/2020 Document Reviewed: 05/15/2020 Elsevier Patient Education  [place]. Diabetes Mellitus and Nutrition, Adult When you have diabetes, or diabetes mellitus, it is very important to have healthy eating habits because your blood sugar (glucose) levels are greatly affected by what you eat and drink. Eating healthy foods in the right amounts, at about the same times every day, can help you: Manage your blood  glucose. Lower your risk of heart disease. Improve your blood pressure. Reach or maintain a healthy weight. What can affect my meal plan? Every person with diabetes is different, and each person has different needs for a meal plan. Your health care provider may recommend that you work with a dietitian to make a meal plan that is best for you. Your meal plan may vary depending on factors such as: The calories you need. The medicines you take. Your weight. Your blood glucose, blood pressure, and cholesterol levels. Your activity level. Other health conditions you have, such as heart or kidney disease. How do carbohydrates affect me? Carbohydrates, also called carbs, affect your blood glucose level more than any other type of food. Eating carbs raises the amount of glucose in your blood. It is important to know how many carbs you can safely have in each meal. This is different for every person. Your dietitian can help you calculate how many carbs you should have at each meal and for each snack. How does alcohol affect me? Alcohol can cause a decrease in blood glucose (hypoglycemia), especially if you use insulin or take certain diabetes  medicines by mouth. Hypoglycemia can be a life-threatening condition. Symptoms of hypoglycemia, such as sleepiness, dizziness, and confusion, are similar to symptoms of having too much alcohol. Do not drink alcohol if: Your health care provider tells you not to drink. You are pregnant, may be pregnant, or are planning to become pregnant. If you drink alcohol: Limit how much you have to: 0-1 drink a day for women. 0-2 drinks a day for men. Know how much alcohol is in your drink. In the [place] one drink equals one 12 oz bottle of beer (355 mL), one 5 oz glass of wine (148 mL), or one 1 oz glass of hard liquor (44 mL). Keep yourself hydrated with water, diet soda, or unsweetened iced tea. Keep in mind that regular soda, juice, and other mixers may contain a lot of  sugar and must be counted as carbs. What are tips for following this plan?  Reading food labels Start by checking the serving size on the Nutrition Facts label of packaged foods and drinks. The number of calories and the amount of carbs, fats, and other nutrients listed on the label are based on one serving of the item. Many items contain more than one serving per package. Check the total grams (g) of carbs in one serving. Check the number of grams of saturated fats and trans fats in one serving. Choose foods that have a low amount or none of these fats. Check the number of milligrams (mg) of salt (sodium) in one serving. Most people should limit total sodium intake to less than 2,300 mg per day. Always check the nutrition information of foods labeled as "low-fat" or "nonfat." These foods may be higher in added sugar or refined carbs and should be avoided. Talk to your dietitian to identify your daily goals for nutrients listed on the label. Shopping Avoid buying canned, pre-made, or processed foods. These foods tend to be high in fat, sodium, and added sugar. Shop around the outside edge of the grocery store. This is where you will most often find fresh fruits and vegetables, bulk grains, fresh meats, and fresh dairy products. Cooking Use low-heat cooking methods, such as baking, instead of high-heat cooking methods, such as deep frying. Cook using healthy oils, such as olive, canola, or sunflower oil. Avoid cooking with butter, cream, or high-fat meats. Meal planning Eat meals and snacks regularly, preferably at the same times every day. Avoid going long periods of time without eating. Eat foods that are high in fiber, such as fresh fruits, vegetables, beans, and whole grains. Eat 4-6 oz (112-168 g) of lean protein each day, such as lean meat, chicken, fish, eggs, or tofu. One ounce (oz) (28 g) of lean protein is equal to: 1 oz (28 g) of meat, chicken, or fish. 1 egg.  cup (62 g) of  tofu. Eat some foods each day that contain healthy fats, such as avocado, nuts, seeds, and fish. What foods should I eat? Fruits Berries. Apples. Oranges. Peaches. Apricots. Plums. Grapes. Mangoes. Papayas. Pomegranates. Kiwi. Cherries. Vegetables Leafy greens, including lettuce, spinach, kale, chard, collard greens, mustard greens, and cabbage. Beets. Cauliflower. Broccoli. Carrots. Green beans. Tomatoes. Peppers. Onions. Cucumbers. Brussels sprouts. Grains Whole grains, such as whole-wheat or whole-grain bread, crackers, tortillas, cereal, and pasta. Unsweetened oatmeal. Quinoa. Brown or wild rice. Meats and other proteins Seafood. Poultry without skin. Lean cuts of poultry and beef. Tofu. Nuts. Seeds. Dairy Low-fat or fat-free dairy products such as milk, yogurt, and cheese. The items listed above may  not be a complete list of foods and beverages you can eat and drink. Contact a dietitian for more information. What foods should I avoid? Fruits Fruits canned with syrup. Vegetables Canned vegetables. Frozen vegetables with butter or cream sauce. Grains Refined white flour and flour products such as bread, pasta, snack foods, and cereals. Avoid all processed foods. Meats and other proteins Fatty cuts of meat. Poultry with skin. Breaded or fried meats. Processed meat. Avoid saturated fats. Dairy Full-fat yogurt, cheese, or milk. Beverages Sweetened drinks, such as soda or iced tea. The items listed above may not be a complete list of foods and beverages you should avoid. Contact a dietitian for more information. Questions to ask a health care provider Do I need to meet with a certified diabetes care and education specialist? Do I need to meet with a dietitian? What number can I call if I have questions? When are the best times to check my blood glucose? Where to find more information: American Diabetes Association: diabetes.org Academy of Nutrition and Dietetics:  eatright.[employer] of Diabetes and Digestive and Kidney Diseases: AmenCredit.is Association of Diabetes Care & Education Specialists: diabeteseducator.org Summary It is important to have healthy eating habits because your blood sugar (glucose) levels are greatly affected by what you eat and drink. It is important to use alcohol carefully. A healthy meal plan will help you manage your blood glucose and lower your risk of heart disease. Your health care provider may recommend that you work with a dietitian to make a meal plan that is best for you. This information is not intended to replace advice given to you by your health care provider. Make sure you discuss any questions you have with your health care provider. Document Revised: 08/04/2019 Document Reviewed: 08/04/2019 Elsevier Patient Education  [place].

## 2022-04-08 NOTE — Plan of Care (Signed)
Chronic Care Management Provider Comprehensive Care Plan    04/08/2022 Name: Rose Miller MRN: [id number] DOB: Nov 02, 1955  Referral to Chronic Care Management (CCM) services was placed by Provider:  Dr. Josephine Igo on Date: 03-14-2022.  Chronic Condition 1: AFIB Provider Assessment and Plan  Continue as-needed use of diltiazem for palpitations. Follow-up with cardiology for regular monitoring. Consider anticoagulation therapy optimization if not already addressed.         Relevant Medications    Semaglutide (RYBELSUS) 3 MG TABS    Other Relevant Orders    AMB Referral to Chronic Care Management Services    Lipid panel (Completed)    Microalbumin / creatinine urine ratio (Completed)    Urinalysis, Routine w reflex microscopic (Completed)    Vitamin D 1,25 dihydroxy    CBC with Differential/Platelet (Completed)    Comprehensive metabolic panel (Completed)    For Home Use Only DME Diabetic Shoe     Expected Outcome/Goals Addressed This Visit (Provider CCM goals/Provider Assessment and plan   CCM (AFIB)  EXPECTED OUTCOME:  MONITOR, SELF- MANAGE AND REDUCE SYMPTOMS OF AFIB   Symptom Management Condition 1: Take all medications as prescribed Attend all scheduled provider appointments Call provider office for new concerns or questions  call the Suicide and Crisis Lifeline: 988 call the [place] National Suicide Prevention Lifeline: 939-179-5444 or TTY: (249)598-0825 [place] 872-387-6605) to talk to a trained counselor call 1-800-273-TALK (toll free, 24 hour hotline) go to [facility] [address], [place] [phone]) if experiencing a Mental Health or [place]  make a plan to eat healthy keep all lab appointments take medicine as prescribed  Chronic Condition 2: DM Provider Assessment and Plan  Continue empagliflozin (JARDIANCE) 10 MG daily. Add Rybelsus possible weight loss and cardiac protection Monitor blood  glucose as per routine. Review additional labs upon availability.         Relevant Medications    Semaglutide (RYBELSUS) 3 MG TABS     Expected Outcome/Goals Addressed This Visit (Provider CCM goals/Provider Assessment and plan   CCM (DIABETES) EXPECTED OUTCOME: MONITOR, SELF-MANAGE AND REDUCE SYMPTOMS OF DIABETES   Symptom Management Condition 2: Take all medications as prescribed Attend all scheduled provider appointments Call provider office for new concerns or questions  call the Suicide and Crisis Lifeline: 988 call the [place] National Suicide Prevention Lifeline: 805 695 0167 or TTY: (531) 037-4887 TTY 539-735-6378) to talk to a trained counselor call 1-800-273-TALK (toll free, 24 hour hotline) go to [facility] [address] [place] [phone]) if experiencing a Mental Health or [place]  check feet daily for cuts, sores or redness trim toenails straight across manage portion size wash and dry feet carefully every day wear comfortable, cotton socks wear comfortable, well-fitting shoes  Problem List Patient Active Problem List   Diagnosis Date Noted   PAF (paroxysmal atrial fibrillation) ([place]) 03/14/2022   Otalgia of left ear 03/14/2022   Diplopia 09/12/2021   Allergic rhinitis 09/12/2021   Pharyngoesophageal dysphagia    Family history of colon cancer in father    Aortic valve disease 12/26/2020   COPD (chronic obstructive pulmonary disease) ([place]) 12/26/2020   Hypertension associated with diabetes ([place]) 04/24/2020   Postmenopausal estrogen deficiency 02/18/2019   Former smoker 05/13/2018   Long-term use of aspirin therapy 05/13/2018   Class 3 severe obesity due to excess calories with body mass index (BMI) of 50.0 to 59.9 in adult ([place]) 03/31/2017   Diabetes mellitus type 2  in obese ([place]) 03/31/2017   CKD stage 3 secondary to diabetes ([place]) 03/31/2017   Hyperlipidemia LDL goal <70 [id number]    Periumbilical pain [id number]   IT band syndrome 09/11/2014   Right shoulder pain 09/11/2014   Ventral hernia 05/01/2014   Morbid obesity ([place]) 03/17/2014    Medication Management  Current Outpatient Medications:    amLODipine (NORVASC) 10 MG tablet, TAKE 1 TABLET DAILY, Disp: 90 tablet, Rfl: 3   amoxicillin (AMOXIL) 500 MG tablet, TAKE 4 TABLETS 1 HOUR PRIORTO DENTAL PROCEDURES AND   CLEANINGS, Disp: 12 tablet, Rfl: 6   apixaban (ELIQUIS) 5 MG TABS tablet, Take 1 tablet (5 mg total) by mouth 2 (two) times daily., Disp: 180 tablet, Rfl: 1   Ascorbic Acid (VITAMIN C) 1000 MG tablet, Take 1,000 mg by mouth daily., Disp: , Rfl:    atorvastatin (LIPITOR) 10 MG tablet, TAKE 1 TABLET DAILY, Disp: 90 tablet, Rfl: 3   Bayer Microlet Lancets lancets, , Disp: , Rfl:    Blood Pressure Monitoring (SPHYGMOMANOMETER) MISC, 1 Units by Does not apply route daily., Disp: 1 each, Rfl: 0   Cholecalciferol 50 MCG (2000 UT) TABS, Take 2,000 Units by mouth daily., Disp: , Rfl:    Coenzyme Q10 (CO Q-10) 100 MG CAPS, Take 100 mg by mouth daily. , Disp: , Rfl:    Cyanocobalamin (B-12) 500 MCG TABS, Take 500 mcg by mouth daily., Disp: , Rfl:    diltiazem (CARDIZEM) 30 MG tablet, Take 1 tablet (30 mg total) by mouth every 6 (six) hours as needed (fast heart rate, heart palpitations)., Disp: 30 tablet, Rfl: 6   empagliflozin (JARDIANCE) 10 MG TABS tablet, Take 1 tablet (10 mg total) by mouth daily before breakfast., Disp: 90 tablet, Rfl: 0   fluticasone (FLONASE) 50 MCG/ACT nasal spray, Place 2 sprays into both nostrils daily., Disp: 16 g, Rfl: 3   furosemide (LASIX) 20 MG tablet, Take 1 tablet (20 mg total) by mouth daily as needed (for lower extremity swelling)., Disp: 30 tablet, Rfl: 4   glucose blood (CONTOUR NEXT TEST) test strip, , Disp: , Rfl:    hydrALAZINE (APRESOLINE) 50 MG tablet, TAKE 1 TABLET 3 TIMES A DAY, Disp: 270 tablet, Rfl: 3   ipratropium (ATROVENT) 0.06 % nasal spray, Place 2 sprays into both  nostrils 4 (four) times daily., Disp: 15 mL, Rfl: 12   losartan (COZAAR) 100 MG tablet, TAKE 1 TABLET DAILY,       DISCONTINUE LOSARTAN /     HYDROCHLOROTHIAZIDE, Disp: 90 tablet, Rfl: 3   Menthol, Topical Analgesic, (BLUE-EMU MAXIMUM STRENGTH) 2.5 % LIQD, Apply 1 application topically daily as needed (pain)., Disp: , Rfl:    metoprolol succinate (TOPROL-XL) 25 MG 24 hr tablet, TAKE 1 TABLET DAILY, Disp: 90 tablet, Rfl: 3   montelukast (SINGULAIR) 10 MG tablet, Take 1 tablet (10 mg total) by mouth at bedtime., Disp: 30 tablet, Rfl: 6   Olopatadine HCl 0.2 % SOLN, Apply 1 drop to eye in the morning and at bedtime., Disp: 2.5 mL, Rfl: 3   omeprazole (PRILOSEC) 20 MG capsule, Take 20 mg by mouth daily., Disp: , Rfl:    PEG-KCl-NaCl-NaSulf-Na Asc-C (PLENVU) 140 g SOLR, Take 1 kit by mouth as directed., Disp: 1 each, Rfl: 0   spironolactone (ALDACTONE) 25 MG tablet, Take 1 tablet (25 mg total) by mouth daily., Disp: 90 tablet, Rfl: 2   Zinc 50 MG TABS, Take 50 mg by mouth daily., Disp: , Rfl:   Cognitive Assessment Identity  Confirmed: : Name; DOB Cognitive Status: Normal   Functional Assessment Hearing Difficulty or Deaf: no Wear Glasses or Blind: yes Vision Management: wears glasses Concentrating, Remembering or Making Decisions Difficulty (CP): no Difficulty Communicating: no Difficulty Eating/Swallowing: no Walking or Climbing Stairs Difficulty: no Dressing/Bathing Difficulty: no Doing Errands Independently Difficulty (such as shopping) (CP): no   Caregiver Assessment  Primary Source of Support/Comfort: extended family Name of Support/Comfort Primary Source: Aela Dryden- family People in Home: child(ren), adult (Her son Maylene Roes lives with her) Family Caregiver if Needed: none Primary Roles/Responsibilities: retired   Planned Interventions  Provided education to patient about basic DM disease process; Reviewed medications with patient and discussed importance of medication  adherence;        Reviewed prescribed diet with patient heart healthy/ADA diet ; Counseled on importance of regular laboratory monitoring as prescribed;        Discussed plans with patient for ongoing care management follow up and provided patient with direct contact information for care management team;      Provided patient with written educational materials related to hypo and hyperglycemia and importance of correct treatment. The patient admits she does not check her blood sugars. Reviewed the sx and sx of hypoglycemia and hyperglycemia with the patient. Also will attach information to her my chart for educational support;       Reviewed scheduled/upcoming provider appointments including: 06-12-2022 at 10 am;         Advised patient, providing education and rationale, to check cbg when you have symptoms of low or high blood sugar and as directed by provider  and record. Does not check her blood sugars on a regular basis. A1C is stable.        call provider for findings outside established parameters;       Referral made to pharmacy team for assistance with help with medication cost and effective management of DM;       Review of patient status, including review of consultants reports, relevant laboratory and other test results, and medications completed;       Advised patient to discuss changes in her DM, questions or concerns with provider;      Screening for signs and symptoms of depression related to chronic disease state;        Assessed social determinant of health barriers;      Provider order and care plan reviewed. Collaborated with PharmD regarding patient care and plan. Counseled on increased risk of stroke due to Afib and benefits of anticoagulation for stroke prevention           Reviewed importance of adherence to anticoagulant exactly as prescribed. The patient takes Eliquis 5 mg BID. States compliance with medications. Has upcoming appointment with the pharm D for medications  support.  Advised patient to discuss changes in her AFIB, questions or concerns related to AFIB or heart health with provider Counseled on bleeding risk associated with AFIB and importance of self-monitoring for signs/symptoms of bleeding Counseled on avoidance of NSAIDs due to increased bleeding risk with anticoagulants Counseled on importance of regular laboratory monitoring as prescribed Counseled on seeking medical attention after a head injury or if there is blood in the urine/stool Afib action plan reviewed. The patient saw the cardiologist on 04-05-2022. No changes in the current plan of care. She states she is doing well and has medications to take if needed for her AFIB Screening for signs and symptoms of depression related to chronic disease state Assessed social determinant  of health barriers       Interaction and coordination with outside resources, practitioners, and providers See CCM Referral  Care Plan: Available in MyChart

## 2022-04-09 DIAGNOSIS — I10 Essential (primary) hypertension: Secondary | ICD-10-CM | POA: Diagnosis not present

## 2022-04-09 DIAGNOSIS — E785 Hyperlipidemia, unspecified: Secondary | ICD-10-CM | POA: Diagnosis not present

## 2022-04-09 DIAGNOSIS — I4891 Unspecified atrial fibrillation: Secondary | ICD-10-CM | POA: Diagnosis not present

## 2022-04-09 DIAGNOSIS — N1831 Chronic kidney disease, stage 3a: Secondary | ICD-10-CM | POA: Diagnosis not present

## 2022-04-09 DIAGNOSIS — H6690 Otitis media, unspecified, unspecified ear: Secondary | ICD-10-CM | POA: Diagnosis not present

## 2022-04-09 DIAGNOSIS — E1122 Type 2 diabetes mellitus with diabetic chronic kidney disease: Secondary | ICD-10-CM | POA: Diagnosis not present

## 2022-04-09 DIAGNOSIS — J309 Allergic rhinitis, unspecified: Secondary | ICD-10-CM | POA: Diagnosis not present

## 2022-04-09 DIAGNOSIS — E1169 Type 2 diabetes mellitus with other specified complication: Secondary | ICD-10-CM | POA: Diagnosis not present

## 2022-04-09 DIAGNOSIS — Z6841 Body Mass Index (BMI) 40.0 and over, adult: Secondary | ICD-10-CM | POA: Diagnosis not present

## 2022-04-09 DIAGNOSIS — I509 Heart failure, unspecified: Secondary | ICD-10-CM | POA: Diagnosis not present

## 2022-04-09 DIAGNOSIS — G8929 Other chronic pain: Secondary | ICD-10-CM | POA: Diagnosis not present

## 2022-04-10 ENCOUNTER — Ambulatory Visit: Payer: No Typology Code available for payment source | Admitting: Podiatry

## 2022-04-10 ENCOUNTER — Other Ambulatory Visit: Payer: HMO

## 2022-04-10 DIAGNOSIS — I10 Essential (primary) hypertension: Secondary | ICD-10-CM

## 2022-04-10 NOTE — Progress Notes (Signed)
04/10/2022 Name: Rose Miller MRN: [id number] DOB: 1955/12/01  No chief complaint on file.  Rose Miller is a 67 y.o. year old female who presented for a telephone visit.   They were referred to the pharmacist by their PCP for assistance in managing medication access.   Patient is participating in a Managed Medicaid Plan: No  Subjective: Referral from PCP to assist with medication affordability:  Jardiance, Eliquis, and Rybelsus Care Team: Primary Care Provider: Bonnita Hollow, MD ; Next Scheduled Visit: 06/12/22  Medication Access/Adherence Current Pharmacy: [facility]  Patient reports affordability concerns with their medications: Yes  Patient reports access/transportation concerns to their pharmacy: No  Patient reports adherence concerns with their medications:  Yes    Diabetes: Current medications: Jardiance 10mg  daily -Medications tried in the past: Ozempic-patient prefers oral therapy -Dr Grandville Silos also recommended addition of Rybelsus for glycemic control and cardiac benefit, but this was not going to be affordable to patient -She is currently paying $94 every 90 days for Jardiance 10mg , and insurance has stated she's nearing coverage gap -Endorses having testing supplies at home but not monitoring regularly  Hypertension: Current medications: losartan 100mg  daily, spironolactone 25mg  daily, metoprolol xl 25mg  daily, hydralazine 50mg  TID, furosemide 20mg  daily as needed for swelling, diltiazem 20mg  q6h if needed for palpitations -patient states she is still taking losartan/hctz because this is what previous pharmacy sent her in 12/2021; however, Dr. [name] changed this to losartan 100mg    Objective: Lab Results  Component Value Date   HGBA1C 6.2 (A) 03/14/2022   Lab Results  Component Value Date   CREATININE 1.25 (H) 03/14/2022   BUN 17 03/14/2022   NA 143 03/14/2022   K 4.0 03/14/2022   CL 105 03/14/2022   CO2 28 03/14/2022   Lab  Results  Component Value Date   CHOL 153 03/14/2022   HDL 52.70 03/14/2022   LDLCALC 72 03/14/2022   TRIG 140.0 03/14/2022   CHOLHDL 3 03/14/2022   Medications Reviewed Today     Reviewed by Darlina Guys, RPH (Pharmacist) on 04/10/22 at 1525  Med List Status: <None>   Medication Order Taking? Sig Documenting Provider Last Dose Status Informant  amLODipine (NORVASC) 10 MG tablet [id number] Yes TAKE 1 TABLET DAILY Belva Crome, MD Taking Active   amoxicillin (AMOXIL) 500 MG tablet [id number]  TAKE 4 TABLETS 1 HOUR PRIORTO DENTAL PROCEDURES AND   CLEANINGS Eileen Stanford, PA-C  Active   apixaban (ELIQUIS) 5 MG TABS tablet [id number] Yes Take 1 tablet (5 mg total) by mouth 2 (two) times daily. Freada Bergeron, MD Taking Active   Ascorbic Acid (VITAMIN C) 1000 MG tablet [id number]  Take 1,000 mg by mouth daily. [provider]  Active Self  atorvastatin (LIPITOR) 10 MG tablet [id number] Yes TAKE 1 TABLET DAILY Belva Crome, MD Taking Active   Bayer Microlet Lancets lancets [id number]   [provider]  Active Self  Blood Pressure Monitoring [facility]) MISC [id number]  1 Units by Does not apply route daily. Flossie Buffy, NP  Active Self  Cholecalciferol 50 MCG (2000 UT) TABS [id number]  Take 2,000 Units by mouth daily. [provider]  Active Self  Coenzyme Q10 (CO Q-10) 100 MG CAPS [id number]  Take 100 mg by mouth daily.  [provider]  Active Self  Cyanocobalamin (B-12) 500 MCG TABS [id number]  Take 500 mcg by mouth daily. [provider]  Active Self  diltiazem (CARDIZEM) 30 MG tablet  [id number] Yes Take 1 tablet (30 mg total) by mouth every 6 (six) hours as needed (fast heart rate, heart palpitations). Belva Crome, MD Taking Active            Med Note Colin Rhein, [place] A   Wed Apr 10, 2022  3:17 PM) rarely  empagliflozin (JARDIANCE) 10 MG TABS tablet [id number] Yes Take 1 tablet (10 mg total) by mouth daily before breakfast.  Libby Maw, MD Taking Active   fluticasone [facility]) 50 MCG/ACT nasal spray [id number]  Place 2 sprays into both nostrils daily. Roney Marion, MD  Active   furosemide (LASIX) 20 MG tablet [id number] Yes Take 1 tablet (20 mg total) by mouth daily as needed (for lower extremity swelling). Freada Bergeron, MD Taking Active            Med Note Colin Rhein, [place] A   Wed Apr 10, 2022  3:06 PM) As needed  glucose blood (CONTOUR NEXT TEST) test strip [id number]   [provider]  Active Self  hydrALAZINE (APRESOLINE) 50 MG tablet [id number] Yes TAKE 1 TABLET 3 TIMES A DAY Belva Crome, MD Taking Active   ipratropium (ATROVENT) 0.06 % nasal spray [id number]  Place 2 sprays into both nostrils 4 (four) times daily. Roney Marion, MD  Active   losartan (COZAAR) 100 MG tablet [id number] No TAKE 1 TABLET DAILY,       DISCONTINUE LOSARTAN /     HYDROCHLOROTHIAZIDE  Patient not taking: Reported on 04/10/2022   Belva Crome, MD Not Taking Active            Med Note Colin Rhein, Veroncia Jezek A   Wed Apr 10, 2022  3:12 PM) Still taking combo tablet  Menthol, Topical Analgesic, (BLUE-EMU MAXIMUM STRENGTH) 2.5 % [id number]  Apply 1 application topically daily as needed (pain). [provider]  Active Self  metoprolol succinate (TOPROL-XL) 25 MG 24 hr tablet [id number] Yes TAKE 1 TABLET DAILY Belva Crome, MD Taking Active   montelukast (SINGULAIR) 10 MG tablet [id number]  Take 1 tablet (10 mg total) by mouth at bedtime. Roney Marion, MD  Active   Olopatadine HCl 0.2 % SOLN [id number]  Apply 1 drop to eye in the morning and at bedtime. Roney Marion, MD  Active   omeprazole (PRILOSEC) 20 MG capsule [id number]  Take 20 mg by mouth daily. [provider]  Active   PEG-KCl-NaCl-NaSulf-Na Asc-C (PLENVU) 140 g SOLR [id number]  Take 1 kit by mouth as directed. Jackquline Denmark, MD  Active Self  spironolactone (ALDACTONE) 25 MG tablet [id number] Yes Take 1 tablet (25 mg total) by mouth  daily. Freada Bergeron, MD Taking Active   Zinc 50 MG TABS [id number]  Take 50 mg by mouth daily. [provider]  Active Self           Assessment/Plan:  Diabetes: - Currently controlled - Meets financial criteria for Jardiance patient assistance program through BI and Rybelsus through [employer]. Will collaborate with provider, CPhT, and patient to pursue assistance.  - Will schedule follow up to discuss regular monitoring/recording of home BG to evaluate therapy efficacy  Hypertension: - Pending prescription for losartan 100mg  daily for Dr. Johney Frame (follows for cards now) to send to [facility] - At follow-up will discuss regular monitoring/recording of home BP to evaluate efficacy of current regimen  Patient also likely to qualify for assistance through BMS for Eliquis based on household income and out-of-pocket medication expenditure.  Will also coordinate with provider (Dr. Johney Frame)  and CPhT to initiate this process.  Follow Up Plan: Telephone call next Friday to check in on PAP application status, discuss monitoring BP and BG at home, and schedule follow-up to check on control of DM and HTN.  Darlina Guys, PharmD, DPLA

## 2022-04-11 ENCOUNTER — Other Ambulatory Visit: Payer: Self-pay

## 2022-04-11 ENCOUNTER — Telehealth: Payer: Self-pay

## 2022-04-11 MED ORDER — LOSARTAN POTASSIUM 100 MG PO TABS
100.0000 mg | ORAL_TABLET | Freq: Every day | ORAL | 3 refills | Status: DC
  Filled 2022-04-11: qty 90, 90d supply, fill #0
  Filled 2022-06-25: qty 90, 90d supply, fill #1
  Filled 2022-09-19: qty 90, 90d supply, fill #2
  Filled 2023-01-06: qty 90, 90d supply, fill #3

## 2022-04-11 NOTE — Telephone Encounter (Signed)
-----   Message from Darlina Guys, [facility] sent at 04/10/2022  4:40 PM EDT ----- Based on preliminary conversation during telephone visit today, patient would be eligible for assistance for her Jardiance, Rybelsus, and Eliquis.  Aware of proof of income requirements and out-of-pocket expenditure for Eliquis.  Could you all start the application processes for her, please?  Thank you!  Darlina Guys, PharmD, DPLA

## 2022-04-11 NOTE — Addendum Note (Signed)
Addended byRonie Spies on: 04/11/2022 09:15 AM   Modules accepted: Orders

## 2022-04-11 NOTE — Telephone Encounter (Signed)
Application has been submitted online for Rybelsus, Jardiance and Eliquis applications will be mailed out 04/15/2022

## 2022-04-11 NOTE — Telephone Encounter (Signed)
Forms printed and placed in provider's office for review.

## 2022-04-11 NOTE — Telephone Encounter (Signed)
-----   Message from Desma Paganini sent at 04/10/2022  2:32 PM EDT ----- Please abstract and route to provider.

## 2022-04-12 ENCOUNTER — Ambulatory Visit: Payer: HMO | Attending: Cardiology

## 2022-04-12 ENCOUNTER — Other Ambulatory Visit (HOSPITAL_COMMUNITY): Payer: Self-pay

## 2022-04-12 DIAGNOSIS — I10 Essential (primary) hypertension: Secondary | ICD-10-CM | POA: Diagnosis not present

## 2022-04-12 DIAGNOSIS — I351 Nonrheumatic aortic (valve) insufficiency: Secondary | ICD-10-CM

## 2022-04-12 DIAGNOSIS — Z7901 Long term (current) use of anticoagulants: Secondary | ICD-10-CM

## 2022-04-12 DIAGNOSIS — E782 Mixed hyperlipidemia: Secondary | ICD-10-CM

## 2022-04-12 DIAGNOSIS — I350 Nonrheumatic aortic (valve) stenosis: Secondary | ICD-10-CM | POA: Diagnosis not present

## 2022-04-12 DIAGNOSIS — Z952 Presence of prosthetic heart valve: Secondary | ICD-10-CM | POA: Diagnosis not present

## 2022-04-12 DIAGNOSIS — Z79899 Other long term (current) drug therapy: Secondary | ICD-10-CM | POA: Diagnosis not present

## 2022-04-13 LAB — BASIC METABOLIC PANEL
BUN/Creatinine Ratio: 11 — ABNORMAL LOW (ref 12–28)
BUN: 15 mg/dL (ref 8–27)
CO2: 22 mmol/L (ref 20–29)
Calcium: 9.4 mg/dL (ref 8.7–10.3)
Chloride: 103 mmol/L (ref 96–106)
Creatinine, Ser: 1.32 mg/dL — ABNORMAL HIGH (ref 0.57–1.00)
Glucose: 124 mg/dL — ABNORMAL HIGH (ref 70–99)
Potassium: 4.1 mmol/L (ref 3.5–5.2)
Sodium: 142 mmol/L (ref 134–144)
eGFR: 44 mL/min/{1.73_m2} — ABNORMAL LOW (ref >59)

## 2022-04-14 DIAGNOSIS — I4891 Unspecified atrial fibrillation: Secondary | ICD-10-CM | POA: Diagnosis not present

## 2022-04-14 DIAGNOSIS — E1159 Type 2 diabetes mellitus with other circulatory complications: Secondary | ICD-10-CM | POA: Diagnosis not present

## 2022-04-15 ENCOUNTER — Other Ambulatory Visit (HOSPITAL_COMMUNITY): Payer: Self-pay

## 2022-04-16 ENCOUNTER — Telehealth: Payer: Self-pay | Admitting: Cardiology

## 2022-04-16 NOTE — Telephone Encounter (Signed)
Pt returning call for lab results. She states  if you c/b this morning, please call home. If after 2pm, please call mobile.

## 2022-04-16 NOTE — Telephone Encounter (Signed)
Called patient with results. Patient verbalized understanding and had no other questions.

## 2022-04-19 ENCOUNTER — Telehealth: Payer: Self-pay

## 2022-04-19 NOTE — Progress Notes (Signed)
   04/19/2022  Patient ID: Rose Miller, female   DOB: 1955-02-06, 67 y.o.   MRN: [id number]  Subjective/Objective: Telephone call to follow-up with patient after our conversation last week to provide update on PAP applications and schedule future appointment to look at home BP and BG numbers.  DM -PAP application for Rybelsus was submitted online by Medication Assistance team last week; no action needed by patient or provider at this time -Patient portion of Jardiance (and Eliquis) PAP applications were mailed to patient home 4/1, but she has yet to receive these -Not checking BG regularly at home -A1c 6.2 on 2/29  HTN -Patient was able to pick up losartan 100mg  daily from [facility] pharmacy location and has started medication and stopped losartan/hctz combo -Not checking home BP but does have a wrist cuff in the home -Most recent clinic value 139/87  Assessment/Plan:  DM -Suggested that patient begin checking FBG at least 2-3x/week and recording, so we can evaluate efficacy of therapy  HTN -Suggested patient also check BP at home at least 2-3x/week and record  Follow-up:  Telephone visit scheduled in 4 weeks to f/u on BP and BG control.  Patient will reach out if any needs before then.  [name], PharmD, DPLA

## 2022-04-22 ENCOUNTER — Other Ambulatory Visit (HOSPITAL_COMMUNITY): Payer: Self-pay

## 2022-04-22 NOTE — Telephone Encounter (Signed)
Pt is enrolled in [employer] pt assistance for Rybelsus until 04/10/2023.

## 2022-04-24 ENCOUNTER — Telehealth: Payer: Self-pay

## 2022-04-24 NOTE — Progress Notes (Signed)
   04/24/2022  Patient ID: Rose Miller, female   DOB: 22-Mar-1955, 67 y.o.   MRN: [id number]  Patient outreach in response to voicemail left for me earlier in the day.  Ms. Lapole was needing clarification around PAP applications.  She did receive the patient portion of the Eliquis and Jardiance applications, and was wanting to verify proof of income accepted by programs.  She also wanted to clarify that Rybelsus will be sent to providers office for pickup versus mailed directly to her.  Able to answer her questions, and she will reach out if any others come up.  Otherwise, I have a telephone follow up to check in with her 05/17/22.  [name], PharmD, DPLA

## 2022-04-25 ENCOUNTER — Telehealth: Payer: Self-pay | Admitting: Family Medicine

## 2022-04-25 NOTE — Telephone Encounter (Signed)
Pt called and stated that someone here called her. But I didn't see any message. Please give the pt a call

## 2022-04-25 NOTE — Telephone Encounter (Signed)
Returned pt call. See Medication Management tele enc for details.

## 2022-04-25 NOTE — Telephone Encounter (Signed)
[facility] message sent to pt to pick up medication from the office along with instructions.

## 2022-04-26 ENCOUNTER — Telehealth: Payer: Self-pay | Admitting: Family Medicine

## 2022-04-26 NOTE — Telephone Encounter (Signed)
PAPERWORK/FORMS received  Dropped off by: pt Call back #: [phone]  Individual made aware of 3-5 business day turn around (YES/NO): yes  GREEN charge sheet completed and patient made aware of possible charge (YES/NO): yes Placed in provider folder at front desk. ~~~ route to CMA/provider Team  CLINICAL USE BELOW THIS LINE (use X to signify action taken)  ___ Form received and placed in providers office for signature. ___ Form completed and faxed to LOA Dept.  ___ Form completed & LVM to notify patient ready for pick up.  ___ Charge sheet and copy of form in front office folder for office supervisor.

## 2022-04-26 NOTE — Telephone Encounter (Signed)
Pt checking on status of this message. The form is from [facility]. Please advise pt at   [phone]  .

## 2022-04-26 NOTE — Telephone Encounter (Signed)
Pt picked up this medication. It is a 2 month supply, she is under the impression she is suppose to get a 4 month supply of this script. Please advise pt at [phone].

## 2022-04-29 ENCOUNTER — Ambulatory Visit: Payer: No Typology Code available for payment source | Admitting: Podiatry

## 2022-04-29 ENCOUNTER — Telehealth: Payer: Self-pay

## 2022-04-29 NOTE — Progress Notes (Signed)
   04/29/2022  Patient ID: Rose Miller, female   DOB: [birth year], 67 y.o.   MRN: [id number]  Subjective/Objective: Clinic routed request after patient picked up Rybelsus sent to office as part of PAP.  Medication Assistance -Office received and patient picked up 30 days of Rybelsus 3mg  and 30 days of Rybelsus 7mg , but she was expecting a total of a 4 month supply of Rybelsus  Assessment/Plan:  Medication Assistance -Contacted [facility], and they will only send 30 days of the 3mg  Rybelsus due to likelihood of dose increase with this medication at that strength -Provider portion of application indicated the patient was to receive Rybelsus 3mg  and 7mg  for a total of 60 tablets -Most that a patient could receieve is 120 days at a time; so depending on how 7mg  is tolerated a refill can be sent for 120 days of 7mg  or 14mg  moving forward -Calling patient to make her aware and forwarding chart to Dr. Janee Morn and team to inform  Follow-up:  Telephone visit scheduled with patient for 5/3, and she sees Dr. Janee Morn 5/29.  Will determine recommended dose to continue with after these appointments and send refill to [facility]  Rose Miller, PharmD, DPLA

## 2022-05-02 ENCOUNTER — Telehealth: Payer: Self-pay

## 2022-05-02 NOTE — Telephone Encounter (Signed)
-----   Message from Garnette Gunner, MD sent at 05/02/2022  7:25 AM EDT ----- Given patient assistance form.  Reviewed, unable to find a place to sign.  Placed on desk.  Please have patient reach back out to program to request a new form.

## 2022-05-02 NOTE — Telephone Encounter (Signed)
Advised that [employer] paper work was faxed, but the patient assistance prescriptions paper work was missing the provider [employer]. I provided her with our fax number so she can contact their office to send [place] another copy for review.

## 2022-05-02 NOTE — Progress Notes (Signed)
   05/02/2022  Patient ID: Rose Miller, female   DOB: [birth year], 67 y.o.   MRN: [id number]  Patient contacted me stating she had returned her portion of patient assistance forms to her PCP office, but the providers have not received their portion of the applications to fill out.  Reaching out to medication assistance team to ask that these be routed to the appropriate providers: Eliquis- [facility], fax # [phone]  Jardiance- [name] PCP, fax # [phone]  [name], PharmD, DPLA

## 2022-05-03 ENCOUNTER — Telehealth: Payer: Self-pay

## 2022-05-03 NOTE — Telephone Encounter (Signed)
Patient returned her portion of these PAP applications to the PCP office;   I have faxed provider page to [facility](Heart care) office for Elquis and [name] (PCP) for Jardiance.    [name] Rx Patient Advocate [phone]

## 2022-05-06 NOTE — Progress Notes (Signed)
   05/06/2022  Patient ID: Rose Miller, female   DOB: [birth year], 67 y.o.   MRN: [id number]  Telephone call from patient stating she had returned her patient portions of PAP applications for Eliquis and Jardiance to PCP office, but they were lacking prescriber portions to complete.  Note indicates office went ahead and faxed in completed patient portion.  Contacted medication assistance team to notify them and ask that they fax provider portions to the appropriate practices: Eliquis- Dr. Shari Prows (cardiology) [place] [facility]- Dr. Janee Morn (PCP)  Will follow-up to verify these were sent and see if provider portion completed/received yet.  [name], PharmD, DPLA

## 2022-05-07 ENCOUNTER — Telehealth: Payer: Self-pay

## 2022-05-07 ENCOUNTER — Ambulatory Visit: Payer: HMO | Admitting: Podiatry

## 2022-05-07 NOTE — Telephone Encounter (Signed)
I have RECEIVED BOTH PT AND PROVIDER PAGES FOR JARDIANCE AND Submitted application for JARDIANCE to [employer]) for patient assistance.   Phone: [phone]  WAITING ON OTHER PROVIDER PORTION FOR THE ELIQUIS (BMS)   PLEASE BE ADVISED

## 2022-05-07 NOTE — Telephone Encounter (Signed)
**Note De-Identified Danyl Deems Obfuscation** We received a providers page of a BMSPAF application for Eliquis assistance from Linward Foster with [facility].  I have completed the page and have e-mailed it to our DOD, Dr Hoyle Barr nurse so she can obtain his signature in Dr Devin Going absence, date it, and so she can then fax it back to [place] at the fax number written on the cover letter included.

## 2022-05-07 NOTE — Telephone Encounter (Signed)
Signed paperwork faxed ?

## 2022-05-07 NOTE — Telephone Encounter (Signed)
ERROR

## 2022-05-08 DIAGNOSIS — I739 Peripheral vascular disease, unspecified: Secondary | ICD-10-CM | POA: Diagnosis not present

## 2022-05-08 DIAGNOSIS — M2041 Other hammer toe(s) (acquired), right foot: Secondary | ICD-10-CM | POA: Diagnosis not present

## 2022-05-08 DIAGNOSIS — M792 Neuralgia and neuritis, unspecified: Secondary | ICD-10-CM | POA: Diagnosis not present

## 2022-05-08 DIAGNOSIS — B351 Tinea unguium: Secondary | ICD-10-CM | POA: Diagnosis not present

## 2022-05-08 DIAGNOSIS — E1142 Type 2 diabetes mellitus with diabetic polyneuropathy: Secondary | ICD-10-CM | POA: Diagnosis not present

## 2022-05-08 DIAGNOSIS — R600 Localized edema: Secondary | ICD-10-CM | POA: Diagnosis not present

## 2022-05-08 NOTE — Telephone Encounter (Signed)
I have RECEIVED BOTH PT AND PROVIDER PAGES for ELIQUIS (BMS)([employer])  and Submitted application for patient assistant    [phone]    PLEASE BE ADVISED  Melanee Spry CPhT Rx Patient Advocate [phone]

## 2022-05-14 ENCOUNTER — Other Ambulatory Visit (HOSPITAL_COMMUNITY): Payer: Self-pay

## 2022-05-14 ENCOUNTER — Other Ambulatory Visit: Payer: Self-pay

## 2022-05-14 ENCOUNTER — Other Ambulatory Visit: Payer: Self-pay | Admitting: Family Medicine

## 2022-05-14 ENCOUNTER — Other Ambulatory Visit: Payer: Self-pay | Admitting: Internal Medicine

## 2022-05-14 DIAGNOSIS — E1165 Type 2 diabetes mellitus with hyperglycemia: Secondary | ICD-10-CM

## 2022-05-14 DIAGNOSIS — N1832 Chronic kidney disease, stage 3b: Secondary | ICD-10-CM

## 2022-05-14 MED ORDER — EMPAGLIFLOZIN 10 MG PO TABS
10.0000 mg | ORAL_TABLET | Freq: Every day | ORAL | 0 refills | Status: AC
Start: 2022-05-14 — End: ?
  Filled 2022-05-14: qty 30, 30d supply, fill #0

## 2022-05-15 ENCOUNTER — Telehealth: Payer: Self-pay | Admitting: Family Medicine

## 2022-05-15 ENCOUNTER — Telehealth: Payer: Self-pay

## 2022-05-15 ENCOUNTER — Other Ambulatory Visit (HOSPITAL_COMMUNITY): Payer: Self-pay

## 2022-05-15 ENCOUNTER — Other Ambulatory Visit: Payer: Self-pay

## 2022-05-15 NOTE — Telephone Encounter (Signed)
Rose Miller this pt wanted to let you know about the forms for her PT assistance paperwork coming and that pg 3 needs to be corrected.

## 2022-05-15 NOTE — Progress Notes (Signed)
   05/15/2022  Patient ID: Rose Miller, female   DOB: Oct 07, 1955, 67 y.o.   MRN: [id number]  Patient outreach to return call/voicemail from [name] from this afternoon.  I had preemptively contacted PAP to check on application status before returning call.  Jardiance application needs patient signature on page 3 of application for "Patient Authorization to Share Health Information."  Eliquis application is currently being denied based on income information.  Patient needs needs to provide proof of income.  [name] is aware and understands.  I will have medication assistance team mail page 3 of the BI application for Jardiance to the patient to sign.  Instructed her to also write name at top of the form and date for completeness.  Provided her with the number to contact BMS around accepted proof of income.  Already have appointment scheduled for Friday to check on home BP and BG; will follow-up on this then.  [name], PharmD, DPLA

## 2022-05-16 NOTE — Telephone Encounter (Signed)
Noted.     I will keep an eye out for the fax.

## 2022-05-17 ENCOUNTER — Other Ambulatory Visit: Payer: HMO

## 2022-05-17 NOTE — Progress Notes (Signed)
   05/17/2022  Patient ID: Rose Miller, female   DOB: 11-18-1955, 67 y.o.   MRN: [id number]  Subjective/Objective: Telephone visit to check on patient's home BP and BG and get updates on PAP applications  HTN -Has taken and recorded home blood pressures this week: 121/76, 137/80, 122/64, 141/711 (had not yet taken BP medications before this reading)  DM -FBG this week:  127, 75, 131, 137 -Has approximately 2 weeks left of Rybelsus 3mg  before starting Rybelsus 7mg  -Will need refill sent to [facility] to either continue with 7mg  dosing or increase to 14mg  -Currently tolerating 3mg  dose well, but does endorse some mild GI discomfort- will monitor  Medication Assistance -Medication assistance team working to get signature page needed in to complete Jardiance PAP application -Patient obtained needed proof of income, and that has been faxed to Eliquis PAP program to complete application  Assessment/Plan:  HTN -Continue current medication regiment and routine BP monitoring at home (can decrease to 2-3 times weekly) -Contact provider office if consistently >130/80  DM -Continue current medication regimen and daily monitoring of BG -I will schedule follow-up in approximately 2 weeks to see how she continues tolerate Rybelsus 3mg  dose  Medication Assistance -Contacting PAP's Monday to verify they have received missing information  Follow-up:  Inform patient of application statuses and schedule 2 week follow-up call  [name], PharmD, DPLA

## 2022-05-21 ENCOUNTER — Telehealth: Payer: Self-pay

## 2022-05-21 ENCOUNTER — Telehealth: Payer: Self-pay | Admitting: Family Medicine

## 2022-05-21 DIAGNOSIS — H43812 Vitreous degeneration, left eye: Secondary | ICD-10-CM | POA: Diagnosis not present

## 2022-05-21 DIAGNOSIS — E119 Type 2 diabetes mellitus without complications: Secondary | ICD-10-CM | POA: Diagnosis not present

## 2022-05-21 DIAGNOSIS — H2513 Age-related nuclear cataract, bilateral: Secondary | ICD-10-CM | POA: Diagnosis not present

## 2022-05-21 DIAGNOSIS — H5010 Unspecified exotropia: Secondary | ICD-10-CM | POA: Diagnosis not present

## 2022-05-21 LAB — HM DIABETES EYE EXAM

## 2022-05-21 NOTE — Telephone Encounter (Signed)
Rose Miller [phone]   Pt called stating she was at the [employer] shoe store to pick up her diabetic shoes and they did not have the script. I have printed the form needed and it is up here in your folder. Please call when sent.

## 2022-05-21 NOTE — Telephone Encounter (Signed)
Received notification from [employer]) regarding approval for [employer]. Patient assistance approved from 05/20/2022 to 12/312024.  Phone: [phone] BI-CARES  Received notification from BMS [facility] [employer]) regarding patient assistance DENIAL for [place]. DUE TO NEEDING PT OUT OOP PHARMACY EXPENSE AMOUNT WILL BE $565.43 FOR THE YEAR APPLY WILL STAY ACTIVE UNTIL THE END OF THE YEAR.   Phone:[phone] (BMS)    Melanee Spry CPhT Rx Patient Advocate [phone]

## 2022-05-21 NOTE — Telephone Encounter (Signed)
[employer] fax received placed in provider's office for review.

## 2022-05-21 NOTE — Telephone Encounter (Signed)
Received a call from [employer] stating that they will send over paperwork for [place] to complete for patient and requested the last OV note from the last 6 months to be faxed over to [phone].

## 2022-05-22 NOTE — Telephone Encounter (Signed)
Rose Miller [phone]   Please call pt back about the forms to [employer] for her shoes.

## 2022-05-22 NOTE — Telephone Encounter (Signed)
**Note De-Identified Dejai Schubach Obfuscation** Letter received from [facility] stating that they have denied the pt Eliquis assistance. Reason:  Documentation of 3% out of pocket RX expenses, based on the households adjusted gross income, not met. [id number]  The letter states that they have notified the pt of this denial as well.

## 2022-05-22 NOTE — Telephone Encounter (Signed)
Patient advised that Dr. Janee Morn has form and is reviewing it. I advised her that we will contact her with an update once it's completed and faxed. She verbalized understanding.

## 2022-05-23 ENCOUNTER — Telehealth: Payer: Self-pay

## 2022-05-23 NOTE — Progress Notes (Signed)
   05/23/2022  Patient ID: Rose Miller, female   DOB: Aug 18, 1955, 67 y.o.   MRN: [id number]  Patient outreach to inform of the following:   -Received notification from BI [employer]) regarding approval for [employer]. Patient assistance approved from 05/20/2022 to 12/312024.  Medication has mailed and patient should receive by Saturday per BI tracking information   -Received notification from BMS [facility] [employer]) regarding patient assistance DENIAL for [place]. DUE TO NEEDING PT OUT OOP PHARMACY EXPENSE AMOUNT WILL BE $565.43 FOR THE YEAR APPLY WILL STAY ACTIVE UNTIL THE END OF THE YEAR.        -Instructed patient to obtain updated out of pocket expense from pharmacy; because after contacting BMS, they had received information through end of March and total was $207.  She could have reached this amount by now, or may soon.  Also verified information was obtained from all pharmacies patient filled at for the year.  She will need to fill Eliquis again and pay for, but will determine if she needs 1 month or 3 month supply to meet OOP.  Once she has met this, she will submit information to BMS.  Follow-up:  Telephone visit in 3 weeks to check on tolerance/effectiveness of Rybelsus prior to upcoming appt with Dr. Thersa Salt, PharmD, DPLA

## 2022-05-24 NOTE — Telephone Encounter (Signed)
Form faxed successfully to [employer] with documents requested. Pt advised and verbalized understanding.

## 2022-05-25 ENCOUNTER — Other Ambulatory Visit (HOSPITAL_COMMUNITY): Payer: Self-pay

## 2022-05-27 ENCOUNTER — Other Ambulatory Visit: Payer: Self-pay

## 2022-05-27 ENCOUNTER — Telehealth: Payer: Self-pay | Admitting: Family Medicine

## 2022-05-27 DIAGNOSIS — E785 Hyperlipidemia, unspecified: Secondary | ICD-10-CM

## 2022-05-27 MED ORDER — ATORVASTATIN CALCIUM 10 MG PO TABS
10.0000 mg | ORAL_TABLET | Freq: Every day | ORAL | 3 refills | Status: DC
Start: 2022-05-27 — End: 2023-05-11
  Filled 2022-05-27: qty 90, 90d supply, fill #0
  Filled 2022-08-20: qty 90, 90d supply, fill #1
  Filled 2022-11-16: qty 90, 90d supply, fill #2
  Filled 2023-02-16: qty 90, 90d supply, fill #3

## 2022-05-27 NOTE — Telephone Encounter (Signed)
Chart supports rx. Last OV: 03/14/2022 Next OV: 06/12/2022

## 2022-05-27 NOTE — Telephone Encounter (Signed)
atorvastatin (LIPITOR) 10 MG tablet [[id number]] Pt needs this refilled she has 0 left. [facility]

## 2022-05-29 ENCOUNTER — Telehealth: Payer: Self-pay | Admitting: Gastroenterology

## 2022-05-29 ENCOUNTER — Other Ambulatory Visit: Payer: Self-pay

## 2022-05-29 MED ORDER — OMEPRAZOLE 20 MG PO CPDR
20.0000 mg | DELAYED_RELEASE_CAPSULE | Freq: Every day | ORAL | 11 refills | Status: DC
  Filled 2022-05-29: qty 30, 30d supply, fill #0
  Filled 2022-06-25: qty 30, 30d supply, fill #1
  Filled 2022-07-24: qty 30, 30d supply, fill #2
  Filled 2022-08-23: qty 30, 30d supply, fill #3
  Filled 2022-10-04: qty 30, 30d supply, fill #4
  Filled 2022-11-04: qty 30, 30d supply, fill #5
  Filled 2022-12-02: qty 30, 30d supply, fill #6
  Filled 2023-01-06: qty 30, 30d supply, fill #7
  Filled 2023-01-31: qty 30, 30d supply, fill #8
  Filled 2023-03-02: qty 30, 30d supply, fill #9
  Filled 2023-04-01: qty 30, 30d supply, fill #10
  Filled 2023-04-27: qty 30, 30d supply, fill #11

## 2022-05-29 NOTE — Telephone Encounter (Signed)
Done

## 2022-05-29 NOTE — Addendum Note (Signed)
Addended by: Alberteen Sam E on: 05/29/2022 03:43 PM   Modules accepted: Orders

## 2022-05-29 NOTE — Telephone Encounter (Signed)
PT is calling to get a refill on omeprazole. It needs to be sent to [facility] long outpatient pharmacy. Requesting call back once it is sent.

## 2022-05-30 ENCOUNTER — Other Ambulatory Visit (HOSPITAL_COMMUNITY): Payer: Self-pay

## 2022-05-31 ENCOUNTER — Telehealth: Payer: Self-pay

## 2022-05-31 NOTE — Progress Notes (Signed)
   05/31/2022  Patient ID: Rose Miller, female   DOB: June 23, 1955, 67 y.o.   MRN: [id number]  Outreach attempt to return patient call/voicemail unsuccessful and unable to leave voicemail.  Will send MyChart message for her to call me back at her convenience.  [name], PharmD, DPLA

## 2022-06-03 ENCOUNTER — Other Ambulatory Visit: Payer: Self-pay

## 2022-06-03 ENCOUNTER — Other Ambulatory Visit (HOSPITAL_COMMUNITY): Payer: Self-pay

## 2022-06-03 ENCOUNTER — Telehealth: Payer: Self-pay

## 2022-06-03 NOTE — Progress Notes (Signed)
   06/03/2022  Patient ID: Rose Miller, female   DOB: 01-25-1955, 67 y.o.   MRN: [id number]  Subjective/Objective:  Medication Access/Adherence - Patient calling to clarify amount that must be paid out of pocket to qualify for Eliquis PAP - Information provided, as well as amount she had spent out of pocket when first application was submitted/denied  Assessment/Plan:  Medication Access/Adherence - Patient plans to refill one more 90 day supply at retail pharmacy and then request statement for out of pocket expenses to resubmit to [employer] for PAP for Eliquis  [name], PharmD, DPLA

## 2022-06-07 ENCOUNTER — Other Ambulatory Visit: Payer: HMO

## 2022-06-07 NOTE — Addendum Note (Signed)
Addended by: Sabino Niemann A on: 06/07/2022 01:32 PM   Modules accepted: Orders

## 2022-06-07 NOTE — Progress Notes (Signed)
   06/07/2022  Patient ID: Rose Miller, female   DOB: [birth year], 67 y.o.   MRN: [id number]  Subjective/Objective:  DM -Current medications:  Jardiance 10mg  daily, Rybelsus 7mg  daily  -FBG: 82, 108, 121, 109 -No s/sx of hypoglycemia -Has taken approximately 12 days of Rybelsus 7mg  now; tolerating well with no GI adverse effects -Only has about 2 week supply of PAP provided Rybelsus remaining; needs refills  HTN -Current medications:  amlodipine 10mg  daily, losartan 100mg  daily, furosemide 20mg  PRN swelling, diltiazem 30mg  PRN palpitations -BP:  128/71, 116/57, 122/68, 135/76  Assessment/Plan:  DM -Recommend 1 more month of Rybelsus 7mg  daily, then increasing to 14mg  daily -No refills remain on patient's prescription at [facility], so they are faxing refill form and dose change form to Dr. Carollee Massed office. -Continue to check FBG regularly and record  HTN -Continue current regimen and routine monitoring/recording  Follow-up:  Telephone visit scheduled for 3 months out but patient has direct number if needs arise before then.  [name], PharmD, DPLA

## 2022-06-11 ENCOUNTER — Other Ambulatory Visit: Payer: Self-pay | Admitting: Internal Medicine

## 2022-06-11 DIAGNOSIS — M792 Neuralgia and neuritis, unspecified: Secondary | ICD-10-CM | POA: Diagnosis not present

## 2022-06-11 DIAGNOSIS — I739 Peripheral vascular disease, unspecified: Secondary | ICD-10-CM | POA: Diagnosis not present

## 2022-06-11 DIAGNOSIS — B351 Tinea unguium: Secondary | ICD-10-CM | POA: Diagnosis not present

## 2022-06-11 DIAGNOSIS — E1142 Type 2 diabetes mellitus with diabetic polyneuropathy: Secondary | ICD-10-CM | POA: Diagnosis not present

## 2022-06-11 DIAGNOSIS — M2041 Other hammer toe(s) (acquired), right foot: Secondary | ICD-10-CM | POA: Diagnosis not present

## 2022-06-11 DIAGNOSIS — R600 Localized edema: Secondary | ICD-10-CM | POA: Diagnosis not present

## 2022-06-12 ENCOUNTER — Telehealth: Payer: Self-pay

## 2022-06-12 ENCOUNTER — Encounter (HOSPITAL_COMMUNITY): Payer: Self-pay

## 2022-06-12 ENCOUNTER — Encounter: Payer: Self-pay | Admitting: Family Medicine

## 2022-06-12 ENCOUNTER — Ambulatory Visit (INDEPENDENT_AMBULATORY_CARE_PROVIDER_SITE_OTHER): Payer: HMO | Admitting: Family Medicine

## 2022-06-12 ENCOUNTER — Other Ambulatory Visit (HOSPITAL_COMMUNITY): Payer: Self-pay

## 2022-06-12 VITALS — BP 136/84 | HR 89 | Temp 97.9°F | Wt 363.4 lb

## 2022-06-12 DIAGNOSIS — Z780 Asymptomatic menopausal state: Secondary | ICD-10-CM | POA: Diagnosis not present

## 2022-06-12 DIAGNOSIS — Z23 Encounter for immunization: Secondary | ICD-10-CM | POA: Diagnosis not present

## 2022-06-12 DIAGNOSIS — Z122 Encounter for screening for malignant neoplasm of respiratory organs: Secondary | ICD-10-CM

## 2022-06-12 DIAGNOSIS — E1169 Type 2 diabetes mellitus with other specified complication: Secondary | ICD-10-CM

## 2022-06-12 DIAGNOSIS — E1165 Type 2 diabetes mellitus with hyperglycemia: Secondary | ICD-10-CM | POA: Diagnosis not present

## 2022-06-12 DIAGNOSIS — E669 Obesity, unspecified: Secondary | ICD-10-CM

## 2022-06-12 DIAGNOSIS — Z7984 Long term (current) use of oral hypoglycemic drugs: Secondary | ICD-10-CM

## 2022-06-12 DIAGNOSIS — Z1231 Encounter for screening mammogram for malignant neoplasm of breast: Secondary | ICD-10-CM

## 2022-06-12 LAB — POCT GLYCOSYLATED HEMOGLOBIN (HGB A1C): Hemoglobin A1C: 6 % — AB (ref 4.0–5.6)

## 2022-06-12 MED ORDER — RYBELSUS 14 MG PO TABS
14.0000 mg | ORAL_TABLET | Freq: Every day | ORAL | 3 refills | Status: DC
Start: 2022-06-12 — End: 2022-06-12
  Filled 2022-06-12: qty 90, 90d supply, fill #0

## 2022-06-12 NOTE — Telephone Encounter (Signed)
Spoke to pt she called to let me know she has met her OOP EXPENSE AT PHARMACY for ELIQUIS([employer])  and will be emailing me the documents.   [name] Patient Advocate [phone]

## 2022-06-12 NOTE — Progress Notes (Signed)
   06/12/2022  Patient ID: Rose Miller, female   DOB: March 31, 1955, 67 y.o.   MRN: [id number]  Ms. Mans was seen by Dr. Janee Morn today, and he is increasing Rybelsus to 14mg  daily.  Prescription for dose increase was sent to [facility] verus [employer] patient assistance program.  Sending link to dose change form for Dr. Janee Morn to complete and fax to [facility] for patient to receive dose increase under PAP.  She is currently down to 13 tablets of 7mg  dose at home and is tolerating well.  [name], PharmD, DPLA

## 2022-06-12 NOTE — Patient Instructions (Signed)
We are increasing to Rybelsus (14 mg), continue Jardiance (10 mg), Monitor your blood sugar levels regularly and report any significant changes or symptoms like low blood sugar or frequent urination. We have placed orders for recommended screenings, including a lung cancer screening, a bone density scan, and a mammogram; the office will call to help schedule. We administered the pneumonia vaccine to help prevent infections.

## 2022-06-12 NOTE — Assessment & Plan Note (Signed)
Patient's diabetes is well controlled with an A1c of 6.0. Currently on Ribelsus and Jardiance  Plan  Increase Ribelsus from 7 mg to 14 mg to improve both glycemic control and aid in weight management. Continue Jardiance 10 mg daily. Monitor blood glucose levels at home. Reassessment in 3 months for HbA1c and overall diabetes management. Educate the patient on recognizing symptoms of hypoglycemia and hyperglycemia. Ensure coordination with the pharmacy for medication supply.

## 2022-06-12 NOTE — Progress Notes (Signed)
Assessment/Plan:   Problem List Items Addressed This Visit       Endocrine   Type 2 diabetes mellitus with obesity (HCC)    Patient's diabetes is well controlled with an A1c of 6.0. Currently on Ribelsus and Jardiance  Plan  Increase Ribelsus from 7 mg to 14 mg to improve both glycemic control and aid in weight management. Continue Jardiance 10 mg daily. Monitor blood glucose levels at home. Reassessment in 3 months for HbA1c and overall diabetes management. Educate the patient on recognizing symptoms of hypoglycemia and hyperglycemia. Ensure coordination with the pharmacy for medication supply.      Relevant Medications   Semaglutide (RYBELSUS) 14 MG TABS     Other   Postmenopausal estrogen deficiency   Relevant Orders   DG Bone Density   Other Visit Diagnoses     Type 2 diabetes mellitus with hyperglycemia, without long-term current use of insulin (HCC)    -  Primary   Relevant Medications   Semaglutide (RYBELSUS) 14 MG TABS   Other Relevant Orders   POCT glycosylated hemoglobin (Hb A1C) (Completed)   Screening for lung cancer       Relevant Orders   Ambulatory Referral Lung Cancer Screening [facility]   Screening mammogram for breast cancer       Relevant Orders   MM DIGITAL SCREENING BILATERAL   Immunization due       Relevant Orders   Pneumococcal conjugate vaccine 20-valent (Prevnar 20) (Completed)       Medications Discontinued During This Encounter  Medication Reason   Semaglutide (RYBELSUS) 7 MG TABS     Return in about 3 months (around 09/12/2022) for DM.    Subjective:   Encounter date: 06/12/2022  Rose Miller is a 67 y.o. female who has Morbid obesity (HCC); Ventral hernia; IT band syndrome; Right shoulder pain; Periumbilical pain; Hyperlipidemia LDL goal <70; Class 3 severe obesity due to excess calories with body mass index (BMI) of 50.0 to 59.9 in adult [facility]); Type 2 diabetes mellitus with obesity (HCC); CKD stage 3 secondary to  diabetes (HCC); Postmenopausal estrogen deficiency; Former smoker; Long-term use of aspirin therapy; Hypertension associated with diabetes (HCC); Aortic valve disease; COPD (chronic obstructive pulmonary disease) (HCC); Pharyngoesophageal dysphagia; Family history of colon cancer in father; Diplopia; Allergic rhinitis; PAF (paroxysmal atrial fibrillation) (HCC); and Otalgia of left ear on their problem list..   She  has a past medical history of Adhesive capsulitis, Aortic valve disease, Arthritis, Asthma, Back pain, CKD (chronic kidney disease) stage 3, GFR 30-59 ml/min (HCC), Constipation, COPD (chronic obstructive pulmonary disease) (HCC), Diabetes mellitus without complication (HCC), Diuretic-induced hypokalemia, Hypercholesterolemia, Hypertension, IT band syndrome, Morbid obesity (HCC), Palpitations, Periumbilical pain, Postmenopausal estrogen deficiency, Prediabetes, S/P TAVR (transcatheter aortic valve replacement) (12/26/2020), and Ventral hernia.[name] Kitchen   CHIEF COMPLAINT: Follow-up for diabetes management, discussion of preventive screenings, and recent medication adjustment.  HISTORY OF PRESENT ILLNESS:  Diabetes. The patient, Rose Miller, has been following up on diabetes management. He has been prescribed Ribelsus and Jardiance for glycemic control. Recently, there was a prescription issue with the pharmacy regarding Ribelsus, which has now been resolved. Current medications include Ribelsus 7 mg, being transitioned to 14 mg, and Jardiance 10 mg. Recent Hemoglobin A1c is stable at 6.0.  Tobacco Use History. The patient started smoking in 1976 and quit in 2013, amounting to a 37-pack-year history. Screening for lung cancer was discussed, and the patient is eligible.  Preventive Screenings.  Colonoscopy: Last done last year. Lung Cancer  Screening: Eligible due to smoking history. Osteoporosis: Last bone scan in 2021, due for another. Mammogram: Last in 2020, due for another. Pneumonia Vaccine:  Recommended for all patients over 65, especially with heart disease.  Review of Systems  Constitutional:  Negative for chills, diaphoresis, fever, malaise/fatigue and weight loss.  HENT:  Negative for congestion, ear discharge, ear pain and hearing loss.   Eyes:  Negative for blurred vision, double vision, photophobia, pain, discharge and redness.  Respiratory:  Negative for cough, sputum production, shortness of breath and wheezing.   Cardiovascular:  Positive for leg swelling (Occasional but stable and not worsening). Negative for chest pain and palpitations.  Gastrointestinal:  Negative for abdominal pain, blood in stool, constipation, diarrhea, heartburn, melena, nausea and vomiting.  Genitourinary:  Negative for dysuria, flank pain, frequency, hematuria and urgency.  Musculoskeletal:  Negative for myalgias.  Skin:  Negative for itching and rash.  Neurological:  Negative for dizziness, tingling, tremors, speech change, seizures, loss of consciousness, weakness and headaches.  Psychiatric/Behavioral:  Negative for depression, hallucinations, memory loss, substance abuse and suicidal ideas. The patient does not have insomnia.   All other systems reviewed and are negative.   Past Surgical History:  Procedure Laterality Date   BIOPSY  05/01/2021   Procedure: BIOPSY;  Surgeon: Lynann Bologna, MD;  Location: [facility];  Service: Gastroenterology;;   CESAREAN SECTION  1989   COLONOSCOPY     COLONOSCOPY WITH PROPOFOL N/A 05/01/2021   Procedure: COLONOSCOPY WITH PROPOFOL;  Surgeon: Lynann Bologna, MD;  Location: [facility];  Service: Gastroenterology;  Laterality: N/A;   ESOPHAGOGASTRODUODENOSCOPY (EGD) WITH PROPOFOL N/A 05/01/2021   Procedure: ESOPHAGOGASTRODUODENOSCOPY (EGD) WITH PROPOFOL;  Surgeon: Lynann Bologna, MD;  Location: [facility];  Service: Gastroenterology;  Laterality: N/A;   INTRAOPERATIVE TRANSTHORACIC ECHOCARDIOGRAM N/A 12/26/2020   Procedure: INTRAOPERATIVE TRANSTHORACIC  ECHOCARDIOGRAM;  Surgeon: Orbie Pyo, MD;  Location: [facility];  Service: Open Heart Surgery;  Laterality: N/A;   MALONEY DILATION  05/01/2021   Procedure: Elease Hashimoto DILATION;  Surgeon: Lynann Bologna, MD;  Location: [facility];  Service: Gastroenterology;;   POLYPECTOMY  05/01/2021   Procedure: POLYPECTOMY;  Surgeon: Lynann Bologna, MD;  Location: [facility];  Service: Gastroenterology;;   RIGHT/LEFT HEART CATH AND CORONARY ANGIOGRAPHY N/A 11/24/2020   Procedure: RIGHT/LEFT HEART CATH AND CORONARY ANGIOGRAPHY;  Surgeon: Lyn Records, MD;  Location: [facility];  Service: Cardiovascular;  Laterality: N/A;   TONSILLECTOMY AND ADENOIDECTOMY  1960   TRANSCATHETER AORTIC VALVE REPLACEMENT, TRANSFEMORAL N/A 12/26/2020   Procedure: TRANSCATHETER AORTIC VALVE REPLACEMENT, TRANSFEMORAL;  Surgeon: Orbie Pyo, MD;  Location: [facility];  Service: Open Heart Surgery;  Laterality: N/A;    Outpatient Medications Prior to Visit  Medication Sig Dispense Refill   amLODipine (NORVASC) 10 MG tablet TAKE 1 TABLET DAILY 90 tablet 3   amoxicillin (AMOXIL) 500 MG tablet TAKE 4 TABLETS 1 HOUR PRIORTO DENTAL PROCEDURES AND   CLEANINGS 12 tablet 6   apixaban (ELIQUIS) 5 MG TABS tablet Take 1 tablet (5 mg total) by mouth 2 (two) times daily. 180 tablet 1   Ascorbic Acid (VITAMIN C) 1000 MG tablet Take 1,000 mg by mouth daily.     atorvastatin (LIPITOR) 10 MG tablet Take 1 tablet (10 mg total) by mouth daily. 90 tablet 3   Bayer Microlet Lancets lancets      Cholecalciferol 50 MCG (2000 UT) TABS Take 2,000 Units by mouth daily.     Coenzyme Q10 (CO Q-10) 100 MG  CAPS Take 100 mg by mouth daily.      Cyanocobalamin (B-12) 500 MCG TABS Take 500 mcg by mouth daily.     diltiazem (CARDIZEM) 30 MG tablet Take 1 tablet (30 mg total) by mouth every 6 (six) hours as needed (fast heart rate, heart palpitations). 30 tablet 6   empagliflozin (JARDIANCE) 10 MG TABS tablet Take 1 tablet (10 mg  total) by mouth daily before breakfast. 90 tablet 0   fluticasone (FLONASE) 50 MCG/ACT nasal spray Place 2 sprays into both nostrils daily. 16 g 3   furosemide (LASIX) 20 MG tablet Take 1 tablet (20 mg total) by mouth daily as needed (for lower extremity swelling). 30 tablet 4   glucose blood (CONTOUR NEXT TEST) test strip      hydrALAZINE (APRESOLINE) 50 MG tablet TAKE 1 TABLET 3 TIMES A DAY 270 tablet 3   ipratropium (ATROVENT) 0.06 % nasal spray Place 2 sprays into both nostrils 4 (four) times daily. 15 mL 12   losartan (COZAAR) 100 MG tablet Take 1 tablet (100 mg total) by mouth daily. Discontinue losartan/hctz 90 tablet 3   Menthol, Topical Analgesic, (BLUE-EMU MAXIMUM STRENGTH) 2.5 % LIQD Apply 1 application topically daily as needed (pain).     metoprolol succinate (TOPROL-XL) 25 MG 24 hr tablet TAKE 1 TABLET DAILY 90 tablet 3   Olopatadine HCl 0.2 % SOLN Apply 1 drop to eye in the morning and at bedtime. 2.5 mL 3   omeprazole (PRILOSEC) 20 MG capsule Take 1 capsule (20 mg total) by mouth daily. 30 capsule 11   spironolactone (ALDACTONE) 25 MG tablet Take 1 tablet (25 mg total) by mouth daily. 90 tablet 2   Zinc 50 MG TABS Take 50 mg by mouth daily.     montelukast (SINGULAIR) 10 MG tablet TAKE 1 TABLET BY MOUTH AT BEDTIME 30 tablet 0   Semaglutide (RYBELSUS) 7 MG TABS Take 7 mg by mouth daily. Take 1 tablet by mouth daily after completing 3mg  dose     No facility-administered medications prior to visit.    Family History  Problem Relation Age of Onset   Dementia Mother    Arthritis Mother    Cancer Mother        pancreatic   Hypertension Mother    Obesity Mother    Cancer Father        prostate and colon   Colon cancer Father    Heart disease Father    Thyroid disease Father     Social History   Socioeconomic History   Marital status: Single    Spouse name: Not on file   Number of children: 1   Years of education: Not on file   Highest education level: Not on file   Occupational History   Occupation: retired  Tobacco Use   Smoking status: Former    Packs/day: 1.00    Years: 37.00    Additional pack years: 0.00    Total pack years: 37.00    Types: Cigarettes    Start date: 75    Quit date: 2013    Years since quitting: 11.4    Passive exposure: Never   Smokeless tobacco: Never  Vaping Use   Vaping Use: Never used  Substance and Sexual Activity   Alcohol use: Yes    Comment: socially   Drug use: No   Sexual activity: Not on file  Other Topics Concern   Not on file  Social History Narrative   Not on file  Social Determinants of Health   Financial Resource Strain: Patient Declined (06/10/2022)   Overall Financial Resource Strain (CARDIA)    Difficulty of Paying Living Expenses: Patient declined  Food Insecurity: Patient Declined (06/10/2022)   Hunger Vital Sign    Worried About Running Out of Food in the Last Year: Patient declined    Ran Out of Food in the Last Year: Patient declined  Transportation Needs: Patient Declined (06/10/2022)   PRAPARE - Administrator, Civil Service (Medical): Patient declined    Lack of Transportation (Non-Medical): Patient declined  Physical Activity: Inactive (06/10/2022)   Exercise Vital Sign    Days of Exercise per Week: 0 days    Minutes of Exercise per Session: 0 min  Stress: No Stress Concern Present (06/10/2022)   [employer] of Occupational Health - Occupational Stress Questionnaire    Feeling of Stress : Not at all  Social Connections: Unknown (06/10/2022)   Social Connection and Isolation Panel [NHANES]    Frequency of Communication with Friends and Family: Patient declined    Frequency of Social Gatherings with Friends and Family: Patient declined    Attends Religious Services: Patient declined    Database administrator or Organizations: Patient declined    Attends Banker Meetings: Never    Marital Status: Patient declined  Recent Concern: Social Connections  - Socially Isolated (04/08/2022)   Social Connection and Isolation Panel [NHANES]    Frequency of Communication with Friends and Family: More than three times a week    Frequency of Social Gatherings with Friends and Family: More than three times a week    Attends Religious Services: Never    Database administrator or Organizations: No    Attends Banker Meetings: Never    Marital Status: Never married  Intimate Partner Violence: Not At Risk (04/08/2022)   Humiliation, Afraid, Rape, and Kick questionnaire    Fear of Current or Ex-Partner: No    Emotionally Abused: No    Physically Abused: No    Sexually Abused: No                                                                                                  Objective:  Physical Exam: BP 136/84 (BP Location: Left Arm, Patient Position: Sitting, Cuff Size: Large)   Pulse 89   Temp 97.9 F (36.6 C) (Temporal)   Wt (!) 363 lb 6.4 oz (164.8 kg)   SpO2 97%   BMI 52.14 kg/m   Wt Readings from Last 3 Encounters:  06/12/22 (!) 363 lb 6.4 oz (164.8 kg)  04/05/22 (!) 361 lb (163.7 kg)  03/25/22 (!) 360 lb (163.3 kg)     Physical Exam Constitutional:      General: She is not in acute distress.    Appearance: Normal appearance. She is not ill-appearing or toxic-appearing.  HENT:     Head: Normocephalic and atraumatic.     Nose: Nose normal. No congestion.  Eyes:     General: No scleral icterus.    Extraocular Movements: Extraocular movements intact.  Cardiovascular:  Rate and Rhythm: Normal rate and regular rhythm.     Pulses: Normal pulses.     Heart sounds: Normal heart sounds.  Pulmonary:     Effort: Pulmonary effort is normal. No respiratory distress.     Breath sounds: Normal breath sounds.  Abdominal:     General: Abdomen is flat. Bowel sounds are normal.     Palpations: Abdomen is soft.  Musculoskeletal:        General: Normal range of motion.     Right lower leg: 1+ Edema present.     Left lower  leg: 1+ Edema present.  Lymphadenopathy:     Cervical: No cervical adenopathy.  Skin:    General: Skin is warm and dry.     Findings: No rash.  Neurological:     General: No focal deficit present.     Mental Status: She is alert and oriented to person, place, and time. Mental status is at baseline.  Psychiatric:        Mood and Affect: Mood normal.        Behavior: Behavior normal.        Thought Content: Thought content normal.        Judgment: Judgment normal.     No results found.  Recent Results (from the past 2160 hour(s))  Basic metabolic panel     Status: Abnormal   Collection Time: 04/12/22  1:12 PM  Result Value Ref Range   Glucose 124 (H) 70 - 99 mg/dL   BUN 15 8 - 27 mg/dL   Creatinine, Ser 1.61 (H) 0.57 - 1.00 mg/dL   eGFR 44 (L) >09 UE/AVW/0.98   BUN/Creatinine Ratio 11 (L) 12 - 28   Sodium 142 134 - 144 mmol/L   Potassium 4.1 3.5 - 5.2 mmol/L   Chloride 103 96 - 106 mmol/L   CO2 22 20 - 29 mmol/L   Calcium 9.4 8.7 - 10.3 mg/dL  HM DIABETES EYE EXAM     Status: None   Collection Time: 05/21/22 12:00 AM  Result Value Ref Range   HM Diabetic Eye Exam No Retinopathy No Retinopathy    Comment: See media Dr. Dione Booze  POCT glycosylated hemoglobin (Hb A1C)     Status: Abnormal   Collection Time: 06/12/22 10:21 AM  Result Value Ref Range   Hemoglobin A1C 6.0 (A) 4.0 - 5.6 %   HbA1c POC (<> result, manual entry)     HbA1c, POC (prediabetic range)     HbA1c, POC (controlled diabetic range)          Garner Nash, MD, MS

## 2022-06-13 ENCOUNTER — Other Ambulatory Visit: Payer: Self-pay

## 2022-06-13 DIAGNOSIS — Z122 Encounter for screening for malignant neoplasm of respiratory organs: Secondary | ICD-10-CM

## 2022-06-13 DIAGNOSIS — Z87891 Personal history of nicotine dependence: Secondary | ICD-10-CM

## 2022-06-13 NOTE — Telephone Encounter (Signed)
RE-Submitted application for ELIQUIS to [employer]( [employer])  for patient assistance. WITH OUTER OF POCKET  EXPENSE REPORT FOR PROCESSING   Phone: [phone]

## 2022-06-17 ENCOUNTER — Ambulatory Visit (INDEPENDENT_AMBULATORY_CARE_PROVIDER_SITE_OTHER): Payer: HMO

## 2022-06-17 ENCOUNTER — Telehealth: Payer: HMO

## 2022-06-17 DIAGNOSIS — I480 Paroxysmal atrial fibrillation: Secondary | ICD-10-CM

## 2022-06-17 DIAGNOSIS — E1165 Type 2 diabetes mellitus with hyperglycemia: Secondary | ICD-10-CM

## 2022-06-17 NOTE — Telephone Encounter (Signed)
**Note De-Identified Rose Miller Obfuscation** Letter received from [facility] stating that they have approved the pt for Eliquis assistance until 01/14/2023. [id number]  The letter states that they have notified the pt of this approval as well.

## 2022-06-17 NOTE — Patient Instructions (Signed)
Please call the care guide team at [phone] if you need to cancel or reschedule your appointment.   If you are experiencing a Mental Health or Behavioral Health Crisis or need someone to talk to, please call the Suicide and Crisis Lifeline: 988 call the [place] National Suicide Prevention Lifeline: (657)160-0065 or TTY: 210 033 0285 TTY (854) 600-7421) to talk to a trained counselor call 1-800-273-TALK (toll free, 24 hour hotline) go to [facility] [address] [place] [phone])   Following is a copy of the CCM Program Consent:  CCM service includes personalized support from designated clinical staff supervised by the physician, including individualized plan of care and coordination with other care providers 24/7 contact phone numbers for assistance for urgent and routine care needs. Service will only be billed when office clinical staff spend 20 minutes or more in a month to coordinate care. Only one practitioner may furnish and bill the service in a calendar month. The patient may stop CCM services at amy time (effective at the end of the month) by phone call to the office staff. The patient will be responsible for cost sharing (co-pay) or up to 20% of the service fee (after annual deductible is met)  Following is a copy of your full provider care plan:   Goals Addressed             This Visit's Progress    CCM Expected Outcome:  Monitor, Self-Manage and Reduce Symptoms of Afib       Current Barriers:  Chronic Disease Management support and education needs related to effective management of AFIB  Planned Interventions: Provider order and care plan reviewed. Collaborated with PharmD regarding patient care and plan. The patient works with the pharm D on a regular basis. Denies any acute changes today. Counseled on increased risk of stroke due to Afib and benefits of anticoagulation for stroke prevention           Reviewed importance of  adherence to anticoagulant exactly as prescribed. The patient takes Eliquis 5 mg BID. States compliance with medications. Works with pharm D. Advised patient to discuss changes in her AFIB, questions or concerns related to AFIB or heart health with provider Counseled on bleeding risk associated with AFIB and importance of self-monitoring for signs/symptoms of bleeding Counseled on avoidance of NSAIDs due to increased bleeding risk with anticoagulants Counseled on importance of regular laboratory monitoring as prescribed Counseled on seeking medical attention after a head injury or if there is blood in the urine/stool Afib action plan reviewed. The patient saw the cardiologist on 04-05-2022. No changes in the current plan of care. She states she is doing well and has medications to take if needed for her AFIB. Denies any new concerns.  Screening for signs and symptoms of depression related to chronic disease state Assessed social determinant of health barriers  Symptom Management: Take medications as prescribed   Attend all scheduled provider appointments Call provider office for new concerns or questions  call the Suicide and Crisis Lifeline: 988 call the [place] National Suicide Prevention Lifeline: 6013861990 or TTY: (518)597-3593 TTY (548) 430-9447) to talk to a trained counselor call 1-800-273-TALK (toll free, 24 hour hotline) go to [facility] [address], [place] [phone]) if experiencing a Mental Health or Behavioral Health Crisis  - make a plan to eat healthy - keep all lab appointments - take medicine as prescribed  Follow Up Plan: Telephone follow up appointment with care management team member scheduled for:  09-23-2022 at 230 pm       CCM Expected Outcome:  Monitor, Self-Manage and Reduce Symptoms of Diabetes       Current Barriers:  Care Coordination needs related to medication cost of DM medications with pharm D referral for 04-10-2022  in a patient with DM Chronic Disease Management support and education needs related to effective management of DM Financial Constraints.  Lab Results  Component Value Date   HGBA1C 6.0 (A) 06/12/2022     Planned Interventions: Provided education to patient about basic DM disease process. The patient saw the pcp and Rybelsus was increased to 14 mg. The patient is working with the pharm D on PAP for dose change.; Reviewed medications with patient and discussed importance of medication adherence. The patient is compliant with medications. Denies any new needs related to medications. Works with pharm D on a regular basis. ;        Reviewed prescribed diet with patient heart healthy/ADA diet. Is compliant with dietary restrictions. ; Counseled on importance of regular laboratory monitoring as prescribed;        Discussed plans with patient for ongoing care management follow up and provided patient with direct contact information for care management team;      Provided patient with written educational materials related to hypo and hyperglycemia and importance of correct treatment. The patient admits she does not check her blood sugars. Reviewed the sx and sx of hypoglycemia and hyperglycemia with the patient. Also will attach information to her my chart for educational support;       Reviewed scheduled/upcoming provider appointments including: 09-12-2022 at 10 am;         Advised patient, providing education and rationale, to check cbg when you have symptoms of low or high blood sugar and as directed by provider  and record. Does not check her blood sugars on a regular basis. A1C is stable.   Took her blood sugars this am and it was 116.     call provider for findings outside established parameters;       Referral made to pharmacy team for assistance with help with medication cost and effective management of DM;       Review of patient status, including review of consultants reports, relevant laboratory  and other test results, and medications completed;       Advised patient to discuss changes in her DM, questions or concerns with provider;      Screening for signs and symptoms of depression related to chronic disease state;        Assessed social determinant of health barriers;         Symptom Management: Take medications as prescribed   Attend all scheduled provider appointments Call provider office for new concerns or questions  call the Suicide and Crisis Lifeline: 988 call the [place] National Suicide Prevention Lifeline: 514 690 8690 or TTY: 539-741-5289 TTY [phone]) to talk to a trained counselor call 1-800-273-TALK (toll free, 24 hour hotline) go to [facility] [address], [place] [phone]) if experiencing a Mental Health or Behavioral Health Crisis  check feet daily for cuts, sores or redness trim toenails straight across manage portion size wash and dry feet carefully every day wear comfortable, cotton socks wear comfortable, well-fitting shoes  Follow Up Plan: Telephone follow up appointment with care management team member scheduled for: 09-23-2022 at 230 pm          Patient verbalizes understanding of instructions and care plan provided  today and agrees to view in MyChart. Active MyChart status and patient understanding of how to access instructions and care plan via MyChart confirmed with patient.  Telephone follow up appointment with care management team member scheduled for: 09-23-2022 at 230 pm

## 2022-06-17 NOTE — Chronic Care Management (AMB) (Signed)
Chronic Care Management   CCM RN Visit Note  06/17/2022 Name: Rose Miller MRN: [id number] DOB: 10-Apr-1955  Subjective: Rose Miller is a 67 y.o. year old female who is a primary care patient of Garnette Gunner, MD. The patient was referred to the Chronic Care Management team for assistance with care management needs subsequent to provider initiation of CCM services and plan of care.    Today's Visit:  Engaged with patient by telephone for follow up visit.        Goals Addressed             This Visit's Progress    CCM Expected Outcome:  Monitor, Self-Manage and Reduce Symptoms of Afib       Current Barriers:  Chronic Disease Management support and education needs related to effective management of AFIB  Planned Interventions: Provider order and care plan reviewed. Collaborated with PharmD regarding patient care and plan. The patient works with the pharm D on a regular basis. Denies any acute changes today. Counseled on increased risk of stroke due to Afib and benefits of anticoagulation for stroke prevention           Reviewed importance of adherence to anticoagulant exactly as prescribed. The patient takes Eliquis 5 mg BID. States compliance with medications. Works with pharm D. Advised patient to discuss changes in her AFIB, questions or concerns related to AFIB or heart health with provider Counseled on bleeding risk associated with AFIB and importance of self-monitoring for signs/symptoms of bleeding Counseled on avoidance of NSAIDs due to increased bleeding risk with anticoagulants Counseled on importance of regular laboratory monitoring as prescribed Counseled on seeking medical attention after a head injury or if there is blood in the urine/stool Afib action plan reviewed. The patient saw the cardiologist on 04-05-2022. No changes in the current plan of care. She states she is doing well and has medications to take if needed for her AFIB. Denies any new concerns.   Screening for signs and symptoms of depression related to chronic disease state Assessed social determinant of health barriers  Symptom Management: Take medications as prescribed   Attend all scheduled provider appointments Call provider office for new concerns or questions  call the Suicide and Crisis Lifeline: 988 call the [place] National Suicide Prevention Lifeline: (778) 027-8233 or TTY: 418-101-5166 TTY [phone]) to talk to a trained counselor call 1-800-273-TALK (toll free, 24 hour hotline) go to [facility] [address] [place] [phone]) if experiencing a Mental Health or Behavioral Health Crisis  - make a plan to eat healthy - keep all lab appointments - take medicine as prescribed  Follow Up Plan: Telephone follow up appointment with care management team member scheduled for: 09-23-2022 at 230 pm       CCM Expected Outcome:  Monitor, Self-Manage and Reduce Symptoms of Diabetes       Current Barriers:  Care Coordination needs related to medication cost of DM medications with pharm D referral for 04-10-2022 in a patient with DM Chronic Disease Management support and education needs related to effective management of DM Financial Constraints.  Lab Results  Component Value Date   HGBA1C 6.0 (A) 06/12/2022     Planned Interventions: Provided education to patient about basic DM disease process. The patient saw the pcp and Rybelsus was increased to 14 mg. The patient is working with the pharm D on PAP for dose change.; Reviewed medications with patient and discussed importance of medication adherence. The patient  is compliant with medications. Denies any new needs related to medications. Works with pharm D on a regular basis. ;        Reviewed prescribed diet with patient heart healthy/ADA diet. Is compliant with dietary restrictions. ; Counseled on importance of regular laboratory monitoring as prescribed;        Discussed plans  with patient for ongoing care management follow up and provided patient with direct contact information for care management team;      Provided patient with written educational materials related to hypo and hyperglycemia and importance of correct treatment. The patient admits she does not check her blood sugars. Reviewed the sx and sx of hypoglycemia and hyperglycemia with the patient. Also will attach information to her my chart for educational support;       Reviewed scheduled/upcoming provider appointments including: 09-12-2022 at 10 am;         Advised patient, providing education and rationale, to check cbg when you have symptoms of low or high blood sugar and as directed by provider  and record. Does not check her blood sugars on a regular basis. A1C is stable.   Took her blood sugars this am and it was 116.     call provider for findings outside established parameters;       Referral made to pharmacy team for assistance with help with medication cost and effective management of DM;       Review of patient status, including review of consultants reports, relevant laboratory and other test results, and medications completed;       Advised patient to discuss changes in her DM, questions or concerns with provider;      Screening for signs and symptoms of depression related to chronic disease state;        Assessed social determinant of health barriers;         Symptom Management: Take medications as prescribed   Attend all scheduled provider appointments Call provider office for new concerns or questions  call the Suicide and Crisis Lifeline: 988 call the [place] National Suicide Prevention Lifeline: 985-838-0828 or TTY: 713-009-7622 TTY [phone]) to talk to a trained counselor call 1-800-273-TALK (toll free, 24 hour hotline) go to [facility] [address], [place] [phone]) if experiencing a Mental Health or Behavioral Health Crisis  check feet  daily for cuts, sores or redness trim toenails straight across manage portion size wash and dry feet carefully every day wear comfortable, cotton socks wear comfortable, well-fitting shoes  Follow Up Plan: Telephone follow up appointment with care management team member scheduled for: 09-23-2022 at 230 pm          Plan:Telephone follow up appointment with care management team member scheduled for:  09-23-2022 at 230 pm  Alto Denver RN, MSN, CCM RN Care Manager  Chronic Care Management Direct Number: [phone]

## 2022-06-18 ENCOUNTER — Telehealth: Payer: Self-pay

## 2022-06-18 NOTE — Progress Notes (Signed)
   06/18/2022  Patient ID: Rose Miller, female   DOB: Nov 05, 1955, 67 y.o.   MRN: [id number]  Telephone call from patient stating she received letter from [employer] stating there was an issue filling Rybelsus 14mg  daily.  Letter states there is missing/invalid information on prescriber section.  Contacted PCP office, and they received the same notification and faxed corrected form to [employer] last week.  This was received, and 14mg  dose approved as of 06/17/22.  Informed patient that the issue is resolved, but the medication may not arrived for another 10 days or so.  She is current down to 7 tablets; so I suggested taking every other day once she gets down to just 3 tablets versus going without medication for several days in a row.    [name], PharmD, DPLA

## 2022-06-21 ENCOUNTER — Telehealth: Payer: Self-pay

## 2022-06-21 NOTE — Telephone Encounter (Signed)
(  Key: [id number])   This request has been approved.  Please note any additional information provided by [employer] at the bottom of your screen.

## 2022-06-25 ENCOUNTER — Telehealth: Payer: Self-pay | Admitting: Family Medicine

## 2022-06-25 ENCOUNTER — Other Ambulatory Visit (HOSPITAL_COMMUNITY): Payer: Self-pay

## 2022-06-25 DIAGNOSIS — I10 Essential (primary) hypertension: Secondary | ICD-10-CM

## 2022-06-25 MED ORDER — AMLODIPINE BESYLATE 10 MG PO TABS
10.0000 mg | ORAL_TABLET | Freq: Every day | ORAL | 3 refills | Status: DC
Start: 2022-06-25 — End: 2023-06-10
  Filled 2022-06-25: qty 90, 90d supply, fill #0
  Filled 2022-09-19: qty 90, 90d supply, fill #1
  Filled 2022-12-18: qty 90, 90d supply, fill #2
  Filled 2023-03-18: qty 90, 90d supply, fill #3

## 2022-06-25 MED ORDER — METOPROLOL SUCCINATE ER 25 MG PO TB24
25.0000 mg | ORAL_TABLET | Freq: Every day | ORAL | 3 refills | Status: DC
  Filled 2022-06-25: qty 90, 90d supply, fill #0
  Filled 2022-09-19: qty 90, 90d supply, fill #1
  Filled 2022-12-18: qty 90, 90d supply, fill #2
  Filled 2023-03-18: qty 90, 90d supply, fill #3

## 2022-06-25 NOTE — Telephone Encounter (Signed)
  amLODipine (NORVASC) 10 MG tablet   Prescription Request  06/25/2022  LOV: 06/12/2022  What is the name of the medication or equipment?  amLODipine (NORVASC) 10 MG tablet  Has 5 pills left - pt has changed pharmacy and said they need new RX  Have you contacted your pharmacy to request a refill? Yes   Which pharmacy would you like this sent to?  [place] [facility] [address] [place] [address] Phone: [phone] Fax: [phone]   Patient notified that their request is being sent to the clinical staff for review and that they should receive a response within 2 business days.   Please advise at Mobile [phone]

## 2022-06-25 NOTE — Telephone Encounter (Signed)
Prescription Request  06/25/2022  LOV: 06/12/2022  What is the name of the medication or equipment? metoprolol succinate (TOPROL-XL) 25 MG 24 hr tablet [[id number]]   Have you contacted your pharmacy to request a refill? No   Which pharmacy would you like this sent to?  [place] [facility] [address] [place] [address] Phone: [phone] Fax: [phone]   Patient notified that their request is being sent to the clinical staff for review and that they should receive a response within 2 business days.   Please advise at Mobile [phone] (mobile)

## 2022-06-25 NOTE — Telephone Encounter (Signed)
Chart supports rx. Last OV: 06/12/2022 Next OV: 09/12/2022   Pt aware and verbalized understanding.

## 2022-06-27 ENCOUNTER — Telehealth: Payer: Self-pay

## 2022-06-27 NOTE — Progress Notes (Signed)
   06/27/2022  Patient ID: Rose Miller, female   DOB: Jun 15, 1955, 67 y.o.   MRN: [id number]  Patient calling to check on the status of her Rybelsus 14mg , as this has yet to ship to Dr. Carollee Massed office; and she is down to 3 tablets.  Contacted [employer], and  they started processing the prescription 5/31, and it can take 10-14 business days after to be received.  Patient is taking remaining tablets that she has every other day to make last.  Shipment should come in the beginning of next week, and office will contact her when this arrives.  Rose Miller is also requesting prescription for hydralazine 25mg  to be sent to [facility].  Refills should be remaining on prescription at [facility] mail order.  I will contact [facility] to transfer this refill for her.  [name], PharmD, DPLA

## 2022-06-28 ENCOUNTER — Other Ambulatory Visit: Payer: Self-pay

## 2022-06-28 ENCOUNTER — Other Ambulatory Visit (HOSPITAL_COMMUNITY): Payer: Self-pay

## 2022-06-28 ENCOUNTER — Other Ambulatory Visit: Payer: Self-pay | Admitting: Physician Assistant

## 2022-06-28 DIAGNOSIS — I10 Essential (primary) hypertension: Secondary | ICD-10-CM

## 2022-06-28 MED ORDER — HYDRALAZINE HCL 50 MG PO TABS
50.0000 mg | ORAL_TABLET | Freq: Three times a day (TID) | ORAL | 3 refills | Status: DC
Start: 2022-06-28 — End: 2023-06-22
  Filled 2022-06-28: qty 270, 90d supply, fill #0
  Filled 2022-09-19: qty 270, 90d supply, fill #1
  Filled 2022-12-24: qty 270, 90d supply, fill #2
  Filled 2023-03-24: qty 270, 90d supply, fill #3

## 2022-07-01 ENCOUNTER — Ambulatory Visit: Payer: No Typology Code available for payment source | Admitting: Podiatry

## 2022-07-02 ENCOUNTER — Telehealth: Payer: Self-pay

## 2022-07-02 NOTE — Telephone Encounter (Addendum)
Received Rybelsus from [employer]. Contacted patient to make her aware, but her voicemail is full. If patient calls back please advise her she can come by the office to pick up medication. Box is on my desk with her name on it

## 2022-07-10 DIAGNOSIS — E1142 Type 2 diabetes mellitus with diabetic polyneuropathy: Secondary | ICD-10-CM | POA: Diagnosis not present

## 2022-07-10 DIAGNOSIS — M2041 Other hammer toe(s) (acquired), right foot: Secondary | ICD-10-CM | POA: Diagnosis not present

## 2022-07-10 DIAGNOSIS — B351 Tinea unguium: Secondary | ICD-10-CM | POA: Diagnosis not present

## 2022-07-10 DIAGNOSIS — M792 Neuralgia and neuritis, unspecified: Secondary | ICD-10-CM | POA: Diagnosis not present

## 2022-07-10 DIAGNOSIS — L600 Ingrowing nail: Secondary | ICD-10-CM | POA: Diagnosis not present

## 2022-07-10 DIAGNOSIS — R600 Localized edema: Secondary | ICD-10-CM | POA: Diagnosis not present

## 2022-07-10 DIAGNOSIS — I739 Peripheral vascular disease, unspecified: Secondary | ICD-10-CM | POA: Diagnosis not present

## 2022-07-14 DIAGNOSIS — I4891 Unspecified atrial fibrillation: Secondary | ICD-10-CM | POA: Diagnosis not present

## 2022-07-14 DIAGNOSIS — E1159 Type 2 diabetes mellitus with other circulatory complications: Secondary | ICD-10-CM

## 2022-07-16 ENCOUNTER — Encounter: Payer: Self-pay | Admitting: Acute Care

## 2022-07-16 ENCOUNTER — Ambulatory Visit (INDEPENDENT_AMBULATORY_CARE_PROVIDER_SITE_OTHER): Payer: HMO | Admitting: Acute Care

## 2022-07-16 DIAGNOSIS — Z87891 Personal history of nicotine dependence: Secondary | ICD-10-CM

## 2022-07-16 NOTE — Patient Instructions (Signed)
Thank you for participating in the [facility] Scre[facility] Program. It was our pleasure to meet you today. We will call you with the results of your scan within the next few days. Your scan will be assigned a Lung RADS category score by the physicians reading the scans.  This Lung RADS score determines follow up scanning.  See below for description of categories, and follow up screening recommendations. We will be in touch to schedule your follow up screening annually or based on recommendations of our providers. We will fax a copy of your scan results to your Primary Care Physician, or the physician who referred you to the program, to ensure they have the results. Please call the office if you have any questions or concerns regarding your scanning experience or results.  Our office number is [phone]. Please speak with Denise Phelps, RN. , or  Denise Buckner RN, They are  our Lung Cancer Screening RN.'s If They are unavailable when you call, Please leave a message on the voice mail. We will return your call at our earliest convenience.This voice mail is monitored several times a day.  Remember, if your scan is normal, we will scan you annually as long as you continue to meet the criteria for the program. (Age 50-80, Current smoker or smoker who has quit within the last 15 years). If you are a smoker, remember, quitting is the single most powerful action that you can take to decrease your risk of lung cancer and other pulmonary, breathing related problems. We know quitting is hard, and we are here to help.  Please let us know if there is anything we can do to help you meet your goal of quitting. If you are a former smoker, congratulations. We are proud of you! Remain smoke free! Remember you can refer friends or family members through the number above.  We will screen them to make sure they meet criteria for the program. Thank you for helping us take better care of you by  participating in Lung Screening.  You can receive free nicotine replacement therapy ( patches, gum or mints) by calling 1-800-QUIT NOW. Please call so we can get you on the path to becoming  a non-smoker. I know it is hard, but you can do this!  Lung RADS Categories:  Lung RADS 1: no nodules or definitely non-concerning nodules.  Recommendation is for a repeat annual scan in 12 months.  Lung RADS 2:  nodules that are non-concerning in appearance and behavior with a very low likelihood of becoming an active cancer. Recommendation is for a repeat annual scan in 12 months.  Lung RADS 3: nodules that are probably non-concerning , includes nodules with a low likelihood of becoming an active cancer.  Recommendation is for a 6-month repeat screening scan. Often noted after an upper respiratory illness. We will be in touch to make sure you have no questions, and to schedule your 6-month scan.  Lung RADS 4 A: nodules with concerning findings, recommendation is most often for a follow up scan in 3 months or additional testing based on our provider's assessment of the scan. We will be in touch to make sure you have no questions and to schedule the recommended 3 month follow up scan.  Lung RADS 4 B:  indicates findings that are concerning. We will be in touch with you to schedule additional diagnostic testing based on our provider's  assessment of the scan.  Other options for assistance in smoking cessation (   As covered by your insurance benefits)  Hypnosis for smoking cessation  [employer] [phone]  Acupuncture for smoking cessation  [facility] [phone]   

## 2022-07-16 NOTE — Progress Notes (Signed)
Virtual Visit via Telephone Note  I connected with [name] on 07/16/22 at  2:30 PM EDT by telephone and verified that I am speaking with the correct person using two identifiers.  Location: Patient:  At home Provider:  [address], [place], [place], [address]    I discussed the limitations, risks, security and privacy concerns of performing an evaluation and management service by telephone and the availability of in person appointments. I also discussed with the patient that there may be a patient responsible charge related to this service. The patient expressed understanding and agreed to proceed.   Shared Decision Making Visit Lung Cancer Screening Program [phone])   Eligibility: Age 67 y.o. Pack Years Smoking History Calculation 38 pack year smoking history (# packs/per year x # years smoked) Recent History of coughing up blood  no Unexplained weight loss? no ( >Than 15 pounds within the last 6 months ) Prior History Lung / other cancer no (Diagnosis within the last 5 years already requiring surveillance chest CT Scans). Smoking Status Former Smoker Former Smokers: Years since quit:  11  Quit Date: 2013  Visit Components: Discussion included one or more decision making aids. yes Discussion included risk/benefits of screening. yes Discussion included potential follow up diagnostic testing for abnormal scans. yes Discussion included meaning and risk of over diagnosis. yes Discussion included meaning and risk of False Positives. yes Discussion included meaning of total radiation exposure. yes  Counseling Included: Importance of adherence to annual lung cancer LDCT screening. yes Impact of comorbidities on ability to participate in the program. yes Ability and willingness to under diagnostic treatment. yes  Smoking Cessation Counseling: Current Smokers:  Discussed importance of smoking cessation. yes Information about tobacco cessation classes and  interventions provided to patient. yes Patient provided with "ticket" for LDCT Scan. yes Symptomatic Patient. no  Counseling NA Diagnosis Code: Tobacco Use Z72.0 Asymptomatic Patient yes  Counseling (Intermediate counseling: > three minutes counseling) U0454 Former Smokers:  Discussed the importance of maintaining cigarette abstinence. yes Diagnosis Code: Personal History of Nicotine Dependence. U98.119 Information about tobacco cessation classes and interventions provided to patient. Yes Patient provided with "ticket" for LDCT Scan. yes Written Order for Lung Cancer Screening with LDCT placed in Epic. Yes (CT Chest Lung Cancer Screening Low Dose W/O CM) [id number] Z12.2-Screening of respiratory organs Z87.891-Personal history of nicotine dependence  I spent 25 minutes of face to face time/virtual visit time  with  Ms. Avallone discussing the risks and benefits of lung cancer screening. We took the time to pause the power point at intervals to allow for questions to be asked and answered to ensure understanding. We discussed that she had taken the single most powerful action possible to decrease her risk of developing lung cancer when she quit smoking. I counseled her to remain smoke free, and to contact me if she ever had the desire to smoke again so that I can provide resources and tools to help support the effort to remain smoke free. We discussed the time and location of the scan, and that either  Abigail Miyamoto RN, Karlton Lemon, RN or I  or I will call / send a letter with the results within  24-72 hours of receiving them. She has the office contact information in the event she needs to speak with me,  she verbalized understanding of all of the above and had no further questions upon leaving the office.     I explained to the patient that there  has been a high incidence of coronary artery disease noted on these exams. I explained that this is a non-gated exam therefore degree or severity cannot  be determined. This patient is on statin therapy. I have asked the patient to follow-up with their PCP regarding any incidental finding of coronary artery disease and management with diet or medication as they feel is clinically indicated. The patient verbalized understanding of the above and had no further questions.     Bevelyn Ngo, NP 07/16/2022

## 2022-07-17 ENCOUNTER — Ambulatory Visit: Payer: HMO | Admitting: Podiatry

## 2022-07-17 ENCOUNTER — Ambulatory Visit
Admission: RE | Admit: 2022-07-17 | Discharge: 2022-07-17 | Disposition: A | Discharge status: Home / Self Care | Payer: HMO | Source: Ambulatory Visit | Attending: Family Medicine | Admitting: Family Medicine

## 2022-07-17 DIAGNOSIS — Z87891 Personal history of nicotine dependence: Secondary | ICD-10-CM

## 2022-07-17 DIAGNOSIS — Z122 Encounter for screening for malignant neoplasm of respiratory organs: Secondary | ICD-10-CM

## 2022-07-22 ENCOUNTER — Other Ambulatory Visit: Payer: Self-pay

## 2022-07-22 DIAGNOSIS — Z87891 Personal history of nicotine dependence: Secondary | ICD-10-CM

## 2022-07-22 DIAGNOSIS — Z122 Encounter for screening for malignant neoplasm of respiratory organs: Secondary | ICD-10-CM

## 2022-07-25 ENCOUNTER — Other Ambulatory Visit (HOSPITAL_COMMUNITY): Payer: Self-pay

## 2022-08-14 ENCOUNTER — Encounter (INDEPENDENT_AMBULATORY_CARE_PROVIDER_SITE_OTHER): Payer: Self-pay

## 2022-08-20 ENCOUNTER — Other Ambulatory Visit: Payer: Self-pay

## 2022-08-23 ENCOUNTER — Other Ambulatory Visit: Payer: Self-pay

## 2022-08-26 ENCOUNTER — Telehealth: Payer: Self-pay | Admitting: Family Medicine

## 2022-08-26 MED ORDER — CONTOUR NEXT TEST VI STRP
ORAL_STRIP | 1 refills | Status: DC
  Filled 2022-08-26: qty 100, fill #0

## 2022-08-26 NOTE — Telephone Encounter (Signed)
Chart supports rx. Last OV: 06/17/2022 Next OV: 09/12/2022

## 2022-08-26 NOTE — Telephone Encounter (Signed)
Prescription Request  08/26/2022  LOV: 06/12/2022  What is the name of the medication or equipment? glucose blood (CONTOUR NEXT TEST) test strip [[id number]]   Have you contacted your pharmacy to request a refill? Yes   Which pharmacy would you like this sent to?  [place] [facility] [address] [place] [address] Phone: [phone] Fax: [phone]      Patient notified that their request is being sent to the clinical staff for review and that they should receive a response within 2 business days.   Please advise at [facility] [phone]

## 2022-08-27 ENCOUNTER — Telehealth: Payer: Self-pay

## 2022-08-27 ENCOUNTER — Other Ambulatory Visit: Payer: Self-pay | Admitting: Family Medicine

## 2022-08-27 ENCOUNTER — Other Ambulatory Visit (HOSPITAL_COMMUNITY): Payer: Self-pay

## 2022-08-27 ENCOUNTER — Other Ambulatory Visit: Payer: Self-pay

## 2022-08-27 MED ORDER — CONTOUR NEXT TEST VI STRP
ORAL_STRIP | 1 refills | Status: DC
  Filled 2022-08-27: qty 100, 100d supply, fill #0

## 2022-08-27 NOTE — Telephone Encounter (Signed)
Received refill form from [employer]. Form completed by provider and faxed to number provided.

## 2022-08-28 ENCOUNTER — Other Ambulatory Visit (HOSPITAL_COMMUNITY): Payer: Self-pay

## 2022-08-29 ENCOUNTER — Other Ambulatory Visit (HOSPITAL_COMMUNITY): Payer: Self-pay

## 2022-08-29 ENCOUNTER — Other Ambulatory Visit: Payer: Self-pay

## 2022-08-29 ENCOUNTER — Telehealth: Payer: Self-pay | Admitting: Family Medicine

## 2022-08-29 NOTE — Telephone Encounter (Signed)
Called number below and was asked for ICD-10 code ,[E11.69] ,for pt's contour strips. Provided them ICD 10 Code.

## 2022-08-29 NOTE — Telephone Encounter (Signed)
Rose   Miller.  from health team adv. Called and have some clinical questions to ask please call him back at [phone]

## 2022-08-30 ENCOUNTER — Other Ambulatory Visit: Payer: Self-pay | Admitting: Family Medicine

## 2022-08-30 DIAGNOSIS — E1169 Type 2 diabetes mellitus with other specified complication: Secondary | ICD-10-CM

## 2022-08-30 MED ORDER — LANCET DEVICE MISC
1.0000 | Freq: Three times a day (TID) | 0 refills | Status: DC
Start: 2022-08-30 — End: 2022-09-04
  Filled 2022-08-30: qty 1, 30d supply, fill #0

## 2022-08-30 MED ORDER — ONETOUCH DELICA PLUS LANCET33G MISC
1.0000 | Freq: Three times a day (TID) | 0 refills | Status: AC
Start: 2022-08-30 — End: 2022-10-03
  Filled 2022-08-30: qty 100, 34d supply, fill #0
  Filled 2022-09-03: qty 100, 30d supply, fill #0

## 2022-08-30 MED ORDER — ONETOUCH ULTRA 2 W/DEVICE KIT
1.0000 | PACK | Freq: Three times a day (TID) | 0 refills | Status: DC
  Filled 2022-08-30 – 2022-09-03 (×2): qty 1, 30d supply, fill #0

## 2022-08-30 MED ORDER — BLOOD GLUCOSE TEST VI STRP
1.0000 | ORAL_STRIP | Freq: Three times a day (TID) | 0 refills | Status: DC
Start: 2022-08-30 — End: 2022-09-04
  Filled 2022-08-30: qty 100, 34d supply, fill #0

## 2022-08-31 ENCOUNTER — Other Ambulatory Visit (HOSPITAL_COMMUNITY): Payer: Self-pay

## 2022-09-02 ENCOUNTER — Other Ambulatory Visit: Payer: Self-pay

## 2022-09-03 ENCOUNTER — Other Ambulatory Visit (HOSPITAL_COMMUNITY): Payer: Self-pay

## 2022-09-03 ENCOUNTER — Other Ambulatory Visit: Payer: Self-pay

## 2022-09-04 ENCOUNTER — Other Ambulatory Visit: Payer: Self-pay | Admitting: Family Medicine

## 2022-09-04 ENCOUNTER — Other Ambulatory Visit (HOSPITAL_COMMUNITY): Payer: Self-pay

## 2022-09-04 DIAGNOSIS — E1169 Type 2 diabetes mellitus with other specified complication: Secondary | ICD-10-CM

## 2022-09-04 MED ORDER — BLOOD GLUCOSE MONITORING SUPPL DEVI
1.0000 | Freq: Three times a day (TID) | 0 refills | Status: AC
Start: 2022-09-04 — End: ?
  Filled 2022-09-04: qty 1, fill #0

## 2022-09-04 MED ORDER — ONETOUCH DELICA LANCETS 33G MISC
1.0000 | Freq: Three times a day (TID) | 0 refills | Status: DC
Start: 2022-09-04 — End: 2023-04-17
  Filled 2022-09-04 – 2022-11-19 (×2): qty 100, 30d supply, fill #0

## 2022-09-04 MED ORDER — BLOOD GLUCOSE TEST VI STRP
1.0000 | ORAL_STRIP | Freq: Three times a day (TID) | 0 refills | Status: AC
Start: 2022-09-04 — End: 2022-12-24
  Filled 2022-09-04 – 2022-11-19 (×2): qty 100, 34d supply, fill #0

## 2022-09-04 MED ORDER — LANCET DEVICE MISC
1.0000 | Freq: Three times a day (TID) | 0 refills | Status: AC
Start: 2022-09-04 — End: 2022-10-04
  Filled 2022-09-04: qty 1, 30d supply, fill #0

## 2022-09-05 ENCOUNTER — Other Ambulatory Visit (HOSPITAL_COMMUNITY): Payer: Self-pay

## 2022-09-05 ENCOUNTER — Other Ambulatory Visit: Payer: HMO

## 2022-09-05 ENCOUNTER — Other Ambulatory Visit: Payer: Self-pay

## 2022-09-05 NOTE — Progress Notes (Unsigned)
   09/05/2022  Patient ID: Rose Miller, female   DOB: March 01, 1955, 67 y.o.   MRN: [id number]  S/O Telephone follow-up to check on HTN and DM control  HTN -Current medications:  spironolactone 25mg  daily, metoprolol xl 25mg  daily, losartan 100mg  daily, hydralazine 50mg  TID, furosemide 20mg  as needed for swelling, diltiazem 30mg  q6h as needed for palpitations, amlodipine 10mg  daily  -Recent home BP readings:131/71, 115/58, 115/67, 102/59, 131/72, 121/66, 112/72, 119/62, 105/61 -Patient endorses 1 instance of orthostatic hypotension recently -Patient rarely needs/uses furosemide or diltiazem  DM -Current medications:  Jardiance 10mg  daily, Rybelsus 14mg  daily- both rec'd through PAP -Recent home BG readings: 106, 112, 113, 118, 112, 117, 115, 123, 113 -Patient does not endorse any s/sx of hypoglycemia or hyperglycemia -States she needs Rybelsus refill from [facility]; currently has 3 week supply on hand -Had to get new glucometer recently-One Touch Ultra 2-and needs directions for use  A/P  HTN -Currently controlled -Discussed slow postural changes to prevent orthostatic hypoglycemia -Continue current medication regimen  DM -Currently controlled -Contacted [facility], and Rybelsus 14mg  refill for 4 month supply is being processed- should arrive at PCP office in 10-14 business days -[employer] message with link to YouTube video showing step-by-step directions for One Touch Ultra 2 use  Follow-up:  6 months  [name], PharmD, DPLA

## 2022-09-06 ENCOUNTER — Other Ambulatory Visit (HOSPITAL_COMMUNITY): Payer: Self-pay

## 2022-09-11 DIAGNOSIS — M2041 Other hammer toe(s) (acquired), right foot: Secondary | ICD-10-CM | POA: Diagnosis not present

## 2022-09-11 DIAGNOSIS — R600 Localized edema: Secondary | ICD-10-CM | POA: Diagnosis not present

## 2022-09-11 DIAGNOSIS — B351 Tinea unguium: Secondary | ICD-10-CM | POA: Diagnosis not present

## 2022-09-11 DIAGNOSIS — E1142 Type 2 diabetes mellitus with diabetic polyneuropathy: Secondary | ICD-10-CM | POA: Diagnosis not present

## 2022-09-11 DIAGNOSIS — M792 Neuralgia and neuritis, unspecified: Secondary | ICD-10-CM | POA: Diagnosis not present

## 2022-09-11 DIAGNOSIS — I739 Peripheral vascular disease, unspecified: Secondary | ICD-10-CM | POA: Diagnosis not present

## 2022-09-12 ENCOUNTER — Encounter: Payer: Self-pay | Admitting: Family Medicine

## 2022-09-12 ENCOUNTER — Ambulatory Visit (INDEPENDENT_AMBULATORY_CARE_PROVIDER_SITE_OTHER): Payer: HMO | Admitting: Family Medicine

## 2022-09-12 VITALS — BP 115/75 | HR 90 | Temp 98.0°F | Resp 16 | Ht 70.0 in | Wt 361.0 lb

## 2022-09-12 DIAGNOSIS — E669 Obesity, unspecified: Secondary | ICD-10-CM | POA: Diagnosis not present

## 2022-09-12 DIAGNOSIS — I152 Hypertension secondary to endocrine disorders: Secondary | ICD-10-CM

## 2022-09-12 DIAGNOSIS — Z7984 Long term (current) use of oral hypoglycemic drugs: Secondary | ICD-10-CM | POA: Diagnosis not present

## 2022-09-12 DIAGNOSIS — E1159 Type 2 diabetes mellitus with other circulatory complications: Secondary | ICD-10-CM | POA: Diagnosis not present

## 2022-09-12 DIAGNOSIS — Z6841 Body Mass Index (BMI) 40.0 and over, adult: Secondary | ICD-10-CM | POA: Diagnosis not present

## 2022-09-12 DIAGNOSIS — E1169 Type 2 diabetes mellitus with other specified complication: Secondary | ICD-10-CM

## 2022-09-12 LAB — POCT GLYCOSYLATED HEMOGLOBIN (HGB A1C)
HbA1c, POC (prediabetic range): 5.7 % (ref 5.7–6.4)
Hemoglobin A1C: 5.7 % — AB (ref 4.0–5.6)

## 2022-09-12 MED ORDER — RYBELSUS 14 MG PO TABS
1.0000 | ORAL_TABLET | Freq: Every day | ORAL | 3 refills | Status: DC
Start: 2022-09-12 — End: 2023-03-14

## 2022-09-12 NOTE — Progress Notes (Signed)
Assessment/Plan:   Problem List Items Addressed This Visit       Cardiovascular and Mediastinum   Hypertension associated with diabetes (HCC)    Blood pressure is well-controlled with current regimen.  Plan: Continue current antihypertensive medications. Encourage home monitoring of blood pressure. Follow-up in 3 months.      Relevant Medications   Semaglutide (RYBELSUS) 14 MG TABS     Endocrine   Type 2 diabetes mellitus with obesity (HCC) - Primary    Patient's diabetes is well controlled with an A1c of 6.0. Currently on Ribelsus and Jardiance  Plan   Continue Rybelsus 14 mg daily and Jardiance 10 mg daily. Monitor blood glucose levels at home. Reassessment in 3 months for HbA1c and overall diabetes management. Educate the patient on recognizing symptoms of hypoglycemia and hyperglycemia.      Relevant Medications   Semaglutide (RYBELSUS) 14 MG TABS   Other Relevant Orders   POCT glycosylated hemoglobin (Hb A1C) (Completed)     Other   Class 3 severe obesity due to excess calories with body mass index (BMI) of 50.0 to 59.9 in adult [facility])    Patient is experiencing stable weight with subjective improvement in well-being. Plan: Discuss potential benefits of adjusting lifestyle habits further. Continue monitoring weight and discussing non-pharmacological interventions. Follow-up in 3 months.      Relevant Medications   Semaglutide (RYBELSUS) 14 MG TABS    Medications Discontinued During This Encounter  Medication Reason   Semaglutide (RYBELSUS) 14 MG TABS Reorder    Return in about 3 months (around 12/13/2022) for BP, DM.    Subjective:   Encounter date: 09/12/2022  Rose Miller is a 67 y.o. female who has Ventral hernia; IT band syndrome; Right shoulder pain; Periumbilical pain; Hyperlipidemia LDL goal <70; Class 3 severe obesity due to excess calories with body mass index (BMI) of 50.0 to 59.9 in adult [facility]); Type 2 diabetes mellitus with obesity  (HCC); CKD stage 3 secondary to diabetes (HCC); Postmenopausal estrogen deficiency; Former smoker; Long-term use of aspirin therapy; Hypertension associated with diabetes (HCC); Aortic valve disease; COPD (chronic obstructive pulmonary disease) (HCC); Pharyngoesophageal dysphagia; Family history of colon cancer in father; Diplopia; Allergic rhinitis; PAF (paroxysmal atrial fibrillation) (HCC); and Otalgia of left ear on their problem list..   She  has a past medical history of Adhesive capsulitis, Aortic valve disease, Arthritis, Asthma, Back pain, CKD (chronic kidney disease) stage 3, GFR 30-59 ml/min (HCC), Constipation, COPD (chronic obstructive pulmonary disease) (HCC), Diabetes mellitus without complication (HCC), Diuretic-induced hypokalemia, Hypercholesterolemia, Hypertension, IT band syndrome, Morbid obesity (HCC), Palpitations, Periumbilical pain, Postmenopausal estrogen deficiency, Prediabetes, S/P TAVR (transcatheter aortic valve replacement) (12/26/2020), and Ventral hernia..   Chief Complaint: Follow-up for diabetes management.  History of Present Illness: Diabetes Mellitus: The patient, Rose Miller, is here for a follow-up visit to manage her diabetes mellitus. She reports having an A1C of 6.0 during the last checkup. The patient has been on an increased dosage of semaglutide (Rybelsus) at 14 mg and Jardiance at 10 mg. She reports that she hasn't experienced significant weight loss, but feels subjectively different. Despite her home monitoring showing blood glucose levels between 100 to 129 mg/dL, she hasn't had any episodes of hypoglycemia. The patient is also monitoring her blood pressure, with home readings averaging around 115/70 mmHg. No signs of polyuria or polydipsia.  Review of Systems  Constitutional:  Negative for chills, diaphoresis, fever, malaise/fatigue and weight loss.  HENT:  Negative for congestion, ear discharge, ear pain and  hearing loss.   Eyes:  Negative for blurred  vision, double vision, photophobia, pain, discharge and redness.  Respiratory:  Negative for cough, sputum production, shortness of breath and wheezing.   Cardiovascular:  Negative for chest pain and palpitations.  Gastrointestinal:  Negative for abdominal pain, blood in stool, constipation, diarrhea, heartburn, melena, nausea and vomiting.  Genitourinary:  Negative for dysuria, flank pain, frequency, hematuria and urgency.  Musculoskeletal:  Negative for myalgias.  Skin:  Negative for itching and rash.  Neurological:  Negative for dizziness, tingling, tremors, speech change, seizures, loss of consciousness, weakness and headaches.  Endo/Heme/Allergies:  Negative for polydipsia.  Psychiatric/Behavioral:  Negative for depression, hallucinations, memory loss, substance abuse and suicidal ideas. The patient does not have insomnia.   All other systems reviewed and are negative.   Past Surgical History:  Procedure Laterality Date   BIOPSY  05/01/2021   Procedure: BIOPSY;  Surgeon: Lynann Bologna, MD;  Location: [facility];  Service: Gastroenterology;;   CESAREAN SECTION  1989   COLONOSCOPY     COLONOSCOPY WITH PROPOFOL N/A 05/01/2021   Procedure: COLONOSCOPY WITH PROPOFOL;  Surgeon: Lynann Bologna, MD;  Location: [facility];  Service: Gastroenterology;  Laterality: N/A;   ESOPHAGOGASTRODUODENOSCOPY (EGD) WITH PROPOFOL N/A 05/01/2021   Procedure: ESOPHAGOGASTRODUODENOSCOPY (EGD) WITH PROPOFOL;  Surgeon: Lynann Bologna, MD;  Location: [facility];  Service: Gastroenterology;  Laterality: N/A;   INTRAOPERATIVE TRANSTHORACIC ECHOCARDIOGRAM N/A 12/26/2020   Procedure: INTRAOPERATIVE TRANSTHORACIC ECHOCARDIOGRAM;  Surgeon: Orbie Pyo, MD;  Location: [facility];  Service: Open Heart Surgery;  Laterality: N/A;   MALONEY DILATION  05/01/2021   Procedure: Elease Hashimoto DILATION;  Surgeon: Lynann Bologna, MD;  Location: [facility];  Service: Gastroenterology;;   POLYPECTOMY  05/01/2021   Procedure:  POLYPECTOMY;  Surgeon: Lynann Bologna, MD;  Location: [facility];  Service: Gastroenterology;;   RIGHT/LEFT HEART CATH AND CORONARY ANGIOGRAPHY N/A 11/24/2020   Procedure: RIGHT/LEFT HEART CATH AND CORONARY ANGIOGRAPHY;  Surgeon: Lyn Records, MD;  Location: [facility];  Service: Cardiovascular;  Laterality: N/A;   TONSILLECTOMY AND ADENOIDECTOMY  1960   TRANSCATHETER AORTIC VALVE REPLACEMENT, TRANSFEMORAL N/A 12/26/2020   Procedure: TRANSCATHETER AORTIC VALVE REPLACEMENT, TRANSFEMORAL;  Surgeon: Orbie Pyo, MD;  Location: [facility];  Service: Open Heart Surgery;  Laterality: N/A;    Outpatient Medications Prior to Visit  Medication Sig Dispense Refill   amLODipine (NORVASC) 10 MG tablet Take 1 tablet (10 mg total) by mouth daily. 90 tablet 3   amoxicillin (AMOXIL) 500 MG tablet TAKE 4 TABLETS 1 HOUR PRIORTO DENTAL PROCEDURES AND   CLEANINGS 12 tablet 6   apixaban (ELIQUIS) 5 MG TABS tablet Take 1 tablet (5 mg total) by mouth 2 (two) times daily. 180 tablet 1   Ascorbic Acid (VITAMIN C) 1000 MG tablet Take 1,000 mg by mouth daily.     atorvastatin (LIPITOR) 10 MG tablet Take 1 tablet (10 mg total) by mouth daily. 90 tablet 3   Blood Glucose Monitoring Suppl (ONE TOUCH ULTRA 2) w/Device KIT Use as directed in the morning, at noon, and at bedtime. 1 kit 0   Cholecalciferol 50 MCG (2000 UT) TABS Take 2,000 Units by mouth daily.     Coenzyme Q10 (CO Q-10) 100 MG CAPS Take 100 mg by mouth daily.      Cyanocobalamin (B-12) 500 MCG TABS Take 500 mcg by mouth daily.     diltiazem (CARDIZEM) 30 MG tablet Take 1 tablet (30 mg total) by mouth every 6 (six)  hours as needed (fast heart rate, heart palpitations). 30 tablet 6   empagliflozin (JARDIANCE) 10 MG TABS tablet Take 1 tablet (10 mg total) by mouth daily before breakfast. 90 tablet 0   fluticasone (FLONASE) 50 MCG/ACT nasal spray Place 2 sprays into both nostrils daily. 16 g 3   furosemide (LASIX) 20 MG tablet Take 1 tablet  (20 mg total) by mouth daily as needed (for lower extremity swelling). 30 tablet 4   Glucose Blood (BLOOD GLUCOSE TEST STRIPS) STRP Use 1 in the morning, at noon, and at bedtime. 100 strip 0   hydrALAZINE (APRESOLINE) 50 MG tablet Take 1 tablet (50 mg total) by mouth 3 (three) times daily. 270 tablet 3   ipratropium (ATROVENT) 0.06 % nasal spray Place 2 sprays into both nostrils 4 (four) times daily. 15 mL 12   Lancet Device MISC 1 each by Does not apply route in the morning, at noon, and at bedtime. May substitute to any manufacturer covered by patient's insurance. 1 each 0   Lancets (ONETOUCH DELICA PLUS LANCET33G) MISC Use as directed in the morning, at noon, and at bedtime. 100 each 0   Lancets Misc. MISC Use as directed in the morning, at noon, and at bedtime. May substitute to any manufacturer covered by patient's insurance. 100 each 0   losartan (COZAAR) 100 MG tablet Take 1 tablet (100 mg total) by mouth daily. Discontinue losartan/hctz 90 tablet 3   Menthol, Topical Analgesic, (BLUE-EMU MAXIMUM STRENGTH) 2.5 % LIQD Apply 1 application topically daily as needed (pain).     metoprolol succinate (TOPROL-XL) 25 MG 24 hr tablet Take 1 tablet (25 mg total) by mouth daily. 90 tablet 3   montelukast (SINGULAIR) 10 MG tablet TAKE 1 TABLET BY MOUTH AT BEDTIME 30 tablet 4   Olopatadine HCl 0.2 % SOLN Apply 1 drop to eye in the morning and at bedtime. 2.5 mL 3   omeprazole (PRILOSEC) 20 MG capsule Take 1 capsule (20 mg total) by mouth daily. 30 capsule 11   spironolactone (ALDACTONE) 25 MG tablet Take 1 tablet (25 mg total) by mouth daily. 90 tablet 2   Zinc 50 MG TABS Take 50 mg by mouth daily.     Semaglutide (RYBELSUS) 14 MG TABS Take 1 tablet by mouth daily.     No facility-administered medications prior to visit.    Family History  Problem Relation Age of Onset   Dementia Mother    Arthritis Mother    Cancer Mother        pancreatic   Hypertension Mother    Obesity Mother    Cancer  Father        prostate and colon   Colon cancer Father    Heart disease Father    Thyroid disease Father     Social History   Socioeconomic History   Marital status: Single    Spouse name: Not on file   Number of children: 1   Years of education: Not on file   Highest education level: Not on file  Occupational History   Occupation: retired  Tobacco Use   Smoking status: Former    Current packs/day: 0.00    Average packs/day: 1 pack/day for 38.0 years (38.0 ttl pk-yrs)    Types: Cigarettes    Start date: 23    Quit date: 2013    Years since quitting: 11.6    Passive exposure: Never   Smokeless tobacco: Never  Vaping Use   Vaping status: Never Used  Substance and Sexual Activity   Alcohol use: Yes    Comment: socially   Drug use: No   Sexual activity: Not on file  Other Topics Concern   Not on file  Social History Narrative   Not on file   Social Determinants of Health   Financial Resource Strain: Patient Declined (06/10/2022)   Overall Financial Resource Strain (CARDIA)    Difficulty of Paying Living Expenses: Patient declined  Food Insecurity: Patient Declined (06/10/2022)   Hunger Vital Sign    Worried About Running Out of Food in the Last Year: Patient declined    Ran Out of Food in the Last Year: Patient declined  Transportation Needs: Patient Declined (06/10/2022)   PRAPARE - Administrator, Civil Service (Medical): Patient declined    Lack of Transportation (Non-Medical): Patient declined  Physical Activity: Inactive (06/10/2022)   Exercise Vital Sign    Days of Exercise per Week: 0 days    Minutes of Exercise per Session: 0 min  Stress: No Stress Concern Present (06/10/2022)   [employer] of Occupational Health - Occupational Stress Questionnaire    Feeling of Stress : Not at all  Social Connections: Unknown (06/10/2022)   Social Connection and Isolation Panel [NHANES]    Frequency of Communication with Friends and Family: Patient  declined    Frequency of Social Gatherings with Friends and Family: Patient declined    Attends Religious Services: Patient declined    Database administrator or Organizations: Patient declined    Attends Banker Meetings: Never    Marital Status: Patient declined  Recent Concern: Social Connections - Socially Isolated (04/08/2022)   Social Connection and Isolation Panel [NHANES]    Frequency of Communication with Friends and Family: More than three times a week    Frequency of Social Gatherings with Friends and Family: More than three times a week    Attends Religious Services: Never    Database administrator or Organizations: No    Attends Banker Meetings: Never    Marital Status: Never married  Intimate Partner Violence: Not At Risk (04/08/2022)   Humiliation, Afraid, Rape, and Kick questionnaire    Fear of Current or Ex-Partner: No    Emotionally Abused: No    Physically Abused: No    Sexually Abused: No                                                                                                  Objective:  Physical Exam: BP 115/75 Comment: home  Pulse 90   Temp 98 F (36.7 C)   Resp 16   Ht 5\' 10"  (1.778 m)   Wt (!) 361 lb (163.7 kg)   SpO2 95%   BMI 51.80 kg/m   Filed Weights   09/12/22 1002  Weight: (!) 361 lb (163.7 kg)   Wt Readings from Last 3 Encounters:  09/12/22 (!) 361 lb (163.7 kg)  06/12/22 (!) 363 lb 6.4 oz (164.8 kg)  04/05/22 (!) 361 lb (163.7 kg)     Physical Exam Constitutional:  General: She is not in acute distress.    Appearance: Normal appearance. She is not ill-appearing or toxic-appearing.  HENT:     Head: Normocephalic and atraumatic.     Nose: Nose normal. No congestion.  Eyes:     General: No scleral icterus.    Extraocular Movements: Extraocular movements intact.  Cardiovascular:     Rate and Rhythm: Normal rate and regular rhythm.     Pulses: Normal pulses.     Heart sounds: Normal heart  sounds.  Pulmonary:     Effort: Pulmonary effort is normal. No respiratory distress.     Breath sounds: Normal breath sounds.  Abdominal:     General: Abdomen is flat. Bowel sounds are normal.     Palpations: Abdomen is soft.  Musculoskeletal:        General: Normal range of motion.  Lymphadenopathy:     Cervical: No cervical adenopathy.  Skin:    General: Skin is warm and dry.     Findings: No rash.  Neurological:     General: No focal deficit present.     Mental Status: She is alert and oriented to person, place, and time. Mental status is at baseline.  Psychiatric:        Mood and Affect: Mood normal.        Behavior: Behavior normal.        Thought Content: Thought content normal.        Judgment: Judgment normal.     CT CHEST LUNG CA SCREEN LOW DOSE W/O CM  Result Date: 07/19/2022 CLINICAL DATA:  Former smoker with 30 pack-year history EXAM: CT CHEST WITHOUT CONTRAST LOW-DOSE FOR LUNG CANCER SCREENING TECHNIQUE: Multidetector CT imaging of the chest was performed following the standard protocol without IV contrast. RADIATION DOSE REDUCTION: This exam was performed according to the departmental dose-optimization program which includes automated exposure control, adjustment of the mA and/or kV according to patient size and/or use of iterative reconstruction technique. COMPARISON:  CT chest angio dated December 14, 2020, current exam will be considered baseline given presence of acute airspace opacities on prior CTA FINDINGS: Cardiovascular: Normal heart size. No pericardial effusion. Prior transcatheter aortic valve replacement. Normal caliber thoracic aorta with mild calcified plaque. Mild coronary artery calcifications. Dilated main pulmonary artery measuring up to 3.6 cm, unchanged when compared with the prior exam. Mediastinum/Nodes: Small hiatal hernia. Thyroid is unremarkable. No enlarged lymph nodes seen in the chest. Lungs/Pleura: Central airways are patent. Mild centrilobular  emphysema. No consolidation, pleural effusion or pneumothorax. Small solid pulmonary nodules. Reference nodule of the right upper lobe measuring 3.1 mm on image 105. Upper Abdomen: Gallstones.  No acute abnormality. Musculoskeletal: No chest wall mass or suspicious bone lesions identified. IMPRESSION: 1. Lung-RADS 2, benign appearance or behavior. Continue annual screening with low-dose chest CT without contrast in 12 months. 2. Dilated main pulmonary artery, findings can be seen in setting of pulmonary hypertension. 3. Coronary artery calcifications, aortic Atherosclerosis (ICD10-I70.0) and Emphysema (ICD10-J43.9). Electronically Signed   By: Allegra Lai M.D.   On: 07/19/2022 20:28   Recent Results (from the past 2160 hour(s))  POCT glycosylated hemoglobin (Hb A1C)     Status: Abnormal   Collection Time: 09/12/22 10:33 AM  Result Value Ref Range   Hemoglobin A1C 5.7 (A) 4.0 - 5.6 %   HbA1c POC (<> result, manual entry)     HbA1c, POC (prediabetic range) 5.7 5.7 - 6.4 %   HbA1c, POC (controlled diabetic range)  Garner Nash, MD, MS

## 2022-09-12 NOTE — Assessment & Plan Note (Signed)
Blood pressure is well-controlled with current regimen.  Plan: Continue current antihypertensive medications. Encourage home monitoring of blood pressure. Follow-up in 3 months.

## 2022-09-12 NOTE — Assessment & Plan Note (Signed)
Patient's diabetes is well controlled with an A1c of 6.0. Currently on Ribelsus and Jardiance  Plan   Continue Rybelsus 14 mg daily and Jardiance 10 mg daily. Monitor blood glucose levels at home. Reassessment in 3 months for HbA1c and overall diabetes management. Educate the patient on recognizing symptoms of hypoglycemia and hyperglycemia.

## 2022-09-12 NOTE — Assessment & Plan Note (Signed)
Patient is experiencing stable weight with subjective improvement in well-being. Plan: Discuss potential benefits of adjusting lifestyle habits further. Continue monitoring weight and discussing non-pharmacological interventions. Follow-up in 3 months.

## 2022-09-12 NOTE — Patient Instructions (Signed)
Continue taking Rybelsus 14 mg and Jardiance 10 mg daily as prescribed. Monitor blood pressure and blood glucose levels at home and report any significant changes. Keep an eye on your feet for any injuries or changes and maintain regular podiatrist visits. Schedule a follow-up appointment in 3 months; you can use MyChart for more details and updates. Take Lasix as needed for swelling and consider compression stockings if swelling persists, especially at the ankles.

## 2022-09-19 ENCOUNTER — Other Ambulatory Visit (HOSPITAL_COMMUNITY): Payer: Self-pay

## 2022-09-23 ENCOUNTER — Other Ambulatory Visit: Payer: HMO

## 2022-09-23 ENCOUNTER — Other Ambulatory Visit: Payer: Self-pay

## 2022-09-23 NOTE — Patient Instructions (Signed)
Visit Information  Thank you for taking time to visit with me today. Please don't hesitate to contact me if I can be of assistance to you before our next scheduled telephone appointment.  Following are the goals we discussed today:   Goals Addressed             This Visit's Progress    RNCM Care Management  Expected Outcome:  Monitor, Self-Manage and Reduce Symptoms of Diabetes       Current Barriers:  Care Coordination needs related to medication cost of DM medications with pharm D referral for 04-10-2022 in a patient with DM Chronic Disease Management support and education needs related to effective management of DM Financial Constraints.  Lab Results  Component Value Date   HGBA1C 5.7 (A) 09/12/2022   HGBA1C 5.7 09/12/2022     Planned Interventions: Provided education to patient about basic DM disease process. The patient saw the pcp and Rybelsus was increased to 14 mg. The patient is working with the pharm D on PAP for dose change. The patient is doing well and denies any new concerns with her DM. She is compliant with medications and pleased with her A1C levels.  Reviewed medications with patient and discussed importance of medication adherence. The patient is compliant with medications. Denies any new needs related to medications. Works with pharm D on a regular basis. ;        Reviewed prescribed diet with patient heart healthy/ADA diet. Is compliant with dietary restrictions. ; Counseled on importance of regular laboratory monitoring as prescribed. Labs are up to date, most recent on 09-12-2022        Discussed plans with patient for ongoing care management follow up and provided patient with direct contact information for care management team;      Provided patient with written educational materials related to hypo and hyperglycemia and importance of correct treatment. The patient admits she does not check her blood sugars. Reviewed the sx and sx of hypoglycemia and hyperglycemia  with the patient. Denies any acute changes in her blood sugars or highs or lows Reviewed scheduled/upcoming provider appointments including: 12-16-2022 at 1040 am;         Advised patient, providing education and rationale, to check cbg when you have symptoms of low or high blood sugar and as directed by provider  and record. Does not check her blood sugars on a regular basis. A1C is stable.      call provider for findings outside established parameters;       Referral made to pharmacy team for assistance with help with medication cost and effective management of DM. Works with the pharm D when needed for medication needs;       Review of patient status, including review of consultants reports, relevant laboratory and other test results, and medications completed;       Advised patient to discuss changes in her DM, questions or concerns with provider;      Screening for signs and symptoms of depression related to chronic disease state;        Assessed social determinant of health barriers;         Symptom Management: Take medications as prescribed   Attend all scheduled provider appointments Call provider office for new concerns or questions  call the Suicide and Crisis Lifeline: 988 call the [place] National Suicide Prevention Lifeline: (724)410-5261 or TTY: 219-081-8004 TTY 269-570-4409) to talk to a trained counselor call 1-800-273-TALK (toll free, 24 hour hotline) go to  [facility] [address], [place] [phone]) if experiencing a Mental Health or Behavioral Health Crisis  check feet daily for cuts, sores or redness trim toenails straight across manage portion size wash and dry feet carefully every day wear comfortable, cotton socks wear comfortable, well-fitting shoes  Follow Up Plan: Telephone follow up appointment with care management team member scheduled for: 12-02-2022 at 230 pm       RNCM Care Management Expected Outcome:  Monitor,  Self-Manage and Reduce Symptoms of Afib       Current Barriers:  Chronic Disease Management support and education needs related to effective management of AFIB  Planned Interventions: Provider order and care plan reviewed. Collaborated with PharmD regarding patient care and plan. The patient works with the pharm D on a regular basis. Denies any acute changes today. Knows to call the provider for new concerns or changes in her AFIB or heart health. Counseled on increased risk of stroke due to Afib and benefits of anticoagulation for stroke prevention           Reviewed importance of adherence to anticoagulant exactly as prescribed. The patient takes Eliquis 5 mg BID. States compliance with medications. Works with pharm D. Advised patient to discuss changes in her AFIB, questions or concerns related to AFIB or heart health with provider Counseled on bleeding risk associated with AFIB and importance of self-monitoring for signs/symptoms of bleeding Counseled on avoidance of NSAIDs due to increased bleeding risk with anticoagulants Counseled on importance of regular laboratory monitoring as prescribed Counseled on seeking medical attention after a head injury or if there is blood in the urine/stool Afib action plan reviewed. The patient saw the cardiologist on 04-05-2022, she saw the pcp recently and got a good report.  No changes in the current plan of care. She states she is doing well and has medications to take if needed for her AFIB. Denies any new concerns.  Screening for signs and symptoms of depression related to chronic disease state Assessed social determinant of health barriers  Symptom Management: Take medications as prescribed   Attend all scheduled provider appointments Call provider office for new concerns or questions  call the Suicide and Crisis Lifeline: 988 call the [place] National Suicide Prevention Lifeline: 830-475-8129 or TTY: (940) 469-6283 TTY 443-819-3596) to talk to a  trained counselor call 1-800-273-TALK (toll free, 24 hour hotline) go to [facility] [address], [place] [phone]) if experiencing a Mental Health or Behavioral Health Crisis  - make a plan to eat healthy - keep all lab appointments - take medicine as prescribed  Follow Up Plan: Telephone follow up appointment with care management team member scheduled for: 12-02-2022 at 230 pm           Our next appointment is by telephone on 12-02-2022 at 230pm  Please call the care guide team at [phone] if you need to cancel or reschedule your appointment.   If you are experiencing a Mental Health or Behavioral Health Crisis or need someone to talk to, please call the Suicide and Crisis Lifeline: 988 call the [place] National Suicide Prevention Lifeline: (781)391-5797 or TTY: 208-277-7625 TTY 512-232-2160) to talk to a trained counselor call 1-800-273-TALK (toll free, 24 hour hotline) go to [facility] [address] [place] [phone])   Patient verbalizes understanding of instructions and care plan provided today and agrees to view in MyChart. Active MyChart status and patient understanding of how to access  instructions and care plan via MyChart confirmed with patient.       Alto Denver RN, MSN, CCM RN Care Manager  [facility]  Ambulatory Care Management  Direct Number: [phone]

## 2022-09-23 NOTE — Patient Outreach (Signed)
Care Management   Visit Note  09/23/2022 Name: Rose Miller MRN: [id number] DOB: 11/17/1955  Subjective: Rose Miller is a 67 y.o. year old female who is a primary care patient of Garnette Gunner, MD. The Care Management team was consulted for assistance.      Engaged with patient spoke with patient by telephone.    Goals Addressed             This Visit's Progress    RNCM Care Management  Expected Outcome:  Monitor, Self-Manage and Reduce Symptoms of Diabetes       Current Barriers:  Care Coordination needs related to medication cost of DM medications with pharm D referral for 04-10-2022 in a patient with DM Chronic Disease Management support and education needs related to effective management of DM Financial Constraints.  Lab Results  Component Value Date   HGBA1C 5.7 (A) 09/12/2022   HGBA1C 5.7 09/12/2022     Planned Interventions: Provided education to patient about basic DM disease process. The patient saw the pcp and Rybelsus was increased to 14 mg. The patient is working with the pharm D on PAP for dose change. The patient is doing well and denies any new concerns with her DM. She is compliant with medications and pleased with her A1C levels.  Reviewed medications with patient and discussed importance of medication adherence. The patient is compliant with medications. Denies any new needs related to medications. Works with pharm D on a regular basis. ;        Reviewed prescribed diet with patient heart healthy/ADA diet. Is compliant with dietary restrictions. ; Counseled on importance of regular laboratory monitoring as prescribed. Labs are up to date, most recent on 09-12-2022        Discussed plans with patient for ongoing care management follow up and provided patient with direct contact information for care management team;      Provided patient with written educational materials related to hypo and hyperglycemia and importance of correct treatment. The patient  admits she does not check her blood sugars. Reviewed the sx and sx of hypoglycemia and hyperglycemia with the patient. Denies any acute changes in her blood sugars or highs or lows Reviewed scheduled/upcoming provider appointments including: 12-16-2022 at 1040 am;         Advised patient, providing education and rationale, to check cbg when you have symptoms of low or high blood sugar and as directed by provider  and record. Does not check her blood sugars on a regular basis. A1C is stable.      call provider for findings outside established parameters;       Referral made to pharmacy team for assistance with help with medication cost and effective management of DM. Works with the pharm D when needed for medication needs;       Review of patient status, including review of consultants reports, relevant laboratory and other test results, and medications completed;       Advised patient to discuss changes in her DM, questions or concerns with provider;      Screening for signs and symptoms of depression related to chronic disease state;        Assessed social determinant of health barriers;         Symptom Management: Take medications as prescribed   Attend all scheduled provider appointments Call provider office for new concerns or questions  call the Suicide and Crisis Lifeline: 988 call the [place] National Suicide Prevention Lifeline: (760) 351-5888  or TTY: 514-241-8314 TTY [phone]) to talk to a trained counselor call 1-800-273-TALK (toll free, 24 hour hotline) go to [facility] [address] [place] [phone]) if experiencing a Mental Health or Behavioral Health Crisis  check feet daily for cuts, sores or redness trim toenails straight across manage portion size wash and dry feet carefully every day wear comfortable, cotton socks wear comfortable, well-fitting shoes  Follow Up Plan: Telephone follow up appointment with care management team  member scheduled for: 12-02-2022 at 230 pm       RNCM Care Management Expected Outcome:  Monitor, Self-Manage and Reduce Symptoms of Afib       Current Barriers:  Chronic Disease Management support and education needs related to effective management of AFIB  Planned Interventions: Provider order and care plan reviewed. Collaborated with PharmD regarding patient care and plan. The patient works with the pharm D on a regular basis. Denies any acute changes today. Knows to call the provider for new concerns or changes in her AFIB or heart health. Counseled on increased risk of stroke due to Afib and benefits of anticoagulation for stroke prevention           Reviewed importance of adherence to anticoagulant exactly as prescribed. The patient takes Eliquis 5 mg BID. States compliance with medications. Works with pharm D. Advised patient to discuss changes in her AFIB, questions or concerns related to AFIB or heart health with provider Counseled on bleeding risk associated with AFIB and importance of self-monitoring for signs/symptoms of bleeding Counseled on avoidance of NSAIDs due to increased bleeding risk with anticoagulants Counseled on importance of regular laboratory monitoring as prescribed Counseled on seeking medical attention after a head injury or if there is blood in the urine/stool Afib action plan reviewed. The patient saw the cardiologist on 04-05-2022, she saw the pcp recently and got a good report.  No changes in the current plan of care. She states she is doing well and has medications to take if needed for her AFIB. Denies any new concerns.  Screening for signs and symptoms of depression related to chronic disease state Assessed social determinant of health barriers  Symptom Management: Take medications as prescribed   Attend all scheduled provider appointments Call provider office for new concerns or questions  call the Suicide and Crisis Lifeline: 988 call the [place] National  Suicide Prevention Lifeline: 657-647-0262 or TTY: (530) 663-9760 TTY [phone]) to talk to a trained counselor call 1-800-273-TALK (toll free, 24 hour hotline) go to [facility] [address] [place] [phone]) if experiencing a Mental Health or Behavioral Health Crisis  - make a plan to eat healthy - keep all lab appointments - take medicine as prescribed  Follow Up Plan: Telephone follow up appointment with care management team member scheduled for: 12-02-2022 at 230 pm             Consent to Services:  Patient was given information about care management services, agreed to services, and gave verbal consent to participate.   Plan: Telephone follow up appointment with care management team member scheduled for: 12-02-2022 at 230 pm  Alto Denver RN, MSN, CCM RN Care Manager  [facility]  Ambulatory Care Management  Direct Number: [phone]

## 2022-10-02 ENCOUNTER — Other Ambulatory Visit (HOSPITAL_COMMUNITY): Payer: Self-pay

## 2022-10-02 DIAGNOSIS — E1142 Type 2 diabetes mellitus with diabetic polyneuropathy: Secondary | ICD-10-CM | POA: Diagnosis not present

## 2022-10-02 DIAGNOSIS — L02619 Cutaneous abscess of unspecified foot: Secondary | ICD-10-CM | POA: Diagnosis not present

## 2022-10-02 DIAGNOSIS — I739 Peripheral vascular disease, unspecified: Secondary | ICD-10-CM | POA: Diagnosis not present

## 2022-10-02 DIAGNOSIS — L600 Ingrowing nail: Secondary | ICD-10-CM | POA: Diagnosis not present

## 2022-10-02 DIAGNOSIS — R600 Localized edema: Secondary | ICD-10-CM | POA: Diagnosis not present

## 2022-10-02 DIAGNOSIS — B351 Tinea unguium: Secondary | ICD-10-CM | POA: Diagnosis not present

## 2022-10-02 DIAGNOSIS — M2041 Other hammer toe(s) (acquired), right foot: Secondary | ICD-10-CM | POA: Diagnosis not present

## 2022-10-02 DIAGNOSIS — M792 Neuralgia and neuritis, unspecified: Secondary | ICD-10-CM | POA: Diagnosis not present

## 2022-10-02 MED ORDER — MUPIROCIN 2 % EX OINT
1.0000 | TOPICAL_OINTMENT | Freq: Two times a day (BID) | CUTANEOUS | 3 refills | Status: DC
  Filled 2022-10-02: qty 22, 14d supply, fill #0

## 2022-10-04 ENCOUNTER — Other Ambulatory Visit (HOSPITAL_COMMUNITY): Payer: Self-pay

## 2022-10-04 ENCOUNTER — Telehealth: Payer: Self-pay | Admitting: Family Medicine

## 2022-10-04 NOTE — Telephone Encounter (Signed)
Received package on my desk today. Contacted pt and advised her she can pick up package from front desk. Sticky note on brown box with pt identifiers. Should be in lower cabinet where the waters are located.

## 2022-10-04 NOTE — Telephone Encounter (Signed)
QMV:HQIONGEXB wants Dr Janee Morn to know her Semaglutide [facility]) 14 MG TABS [[id number]] should be delivered today 10/04/22. If any further question's Norvodisk at [phone]

## 2022-10-07 ENCOUNTER — Telehealth: Payer: Self-pay

## 2022-10-07 ENCOUNTER — Other Ambulatory Visit (HOSPITAL_COMMUNITY): Payer: Self-pay

## 2022-10-07 NOTE — Progress Notes (Signed)
10/07/2022  Patient ID: Rose Miller, female   DOB: 08/12/1955, 67 y.o.   MRN: [id number]  Returning patient call/voicemail regarding her Rybelsus 14mg  supplied by [facility] PAP.  Patient had reached out to check on the availability of her next refill, but the medication was then received by Dr. Carollee Massed office Friday afternoon, and she plans to pick it up this week.  [name], PharmD, DPLA

## 2022-10-16 DIAGNOSIS — L600 Ingrowing nail: Secondary | ICD-10-CM | POA: Diagnosis not present

## 2022-10-16 DIAGNOSIS — M792 Neuralgia and neuritis, unspecified: Secondary | ICD-10-CM | POA: Diagnosis not present

## 2022-11-04 ENCOUNTER — Other Ambulatory Visit: Payer: Self-pay

## 2022-11-06 IMAGING — DX DG CHEST 2V
2 series · 2 of 2 positions shown · non-contrast
Comparison: Chest x-ray 12/22/2020

CLINICAL DATA: Shortness of breath

EXAM:
CHEST - 2 VIEW

[chest pa]
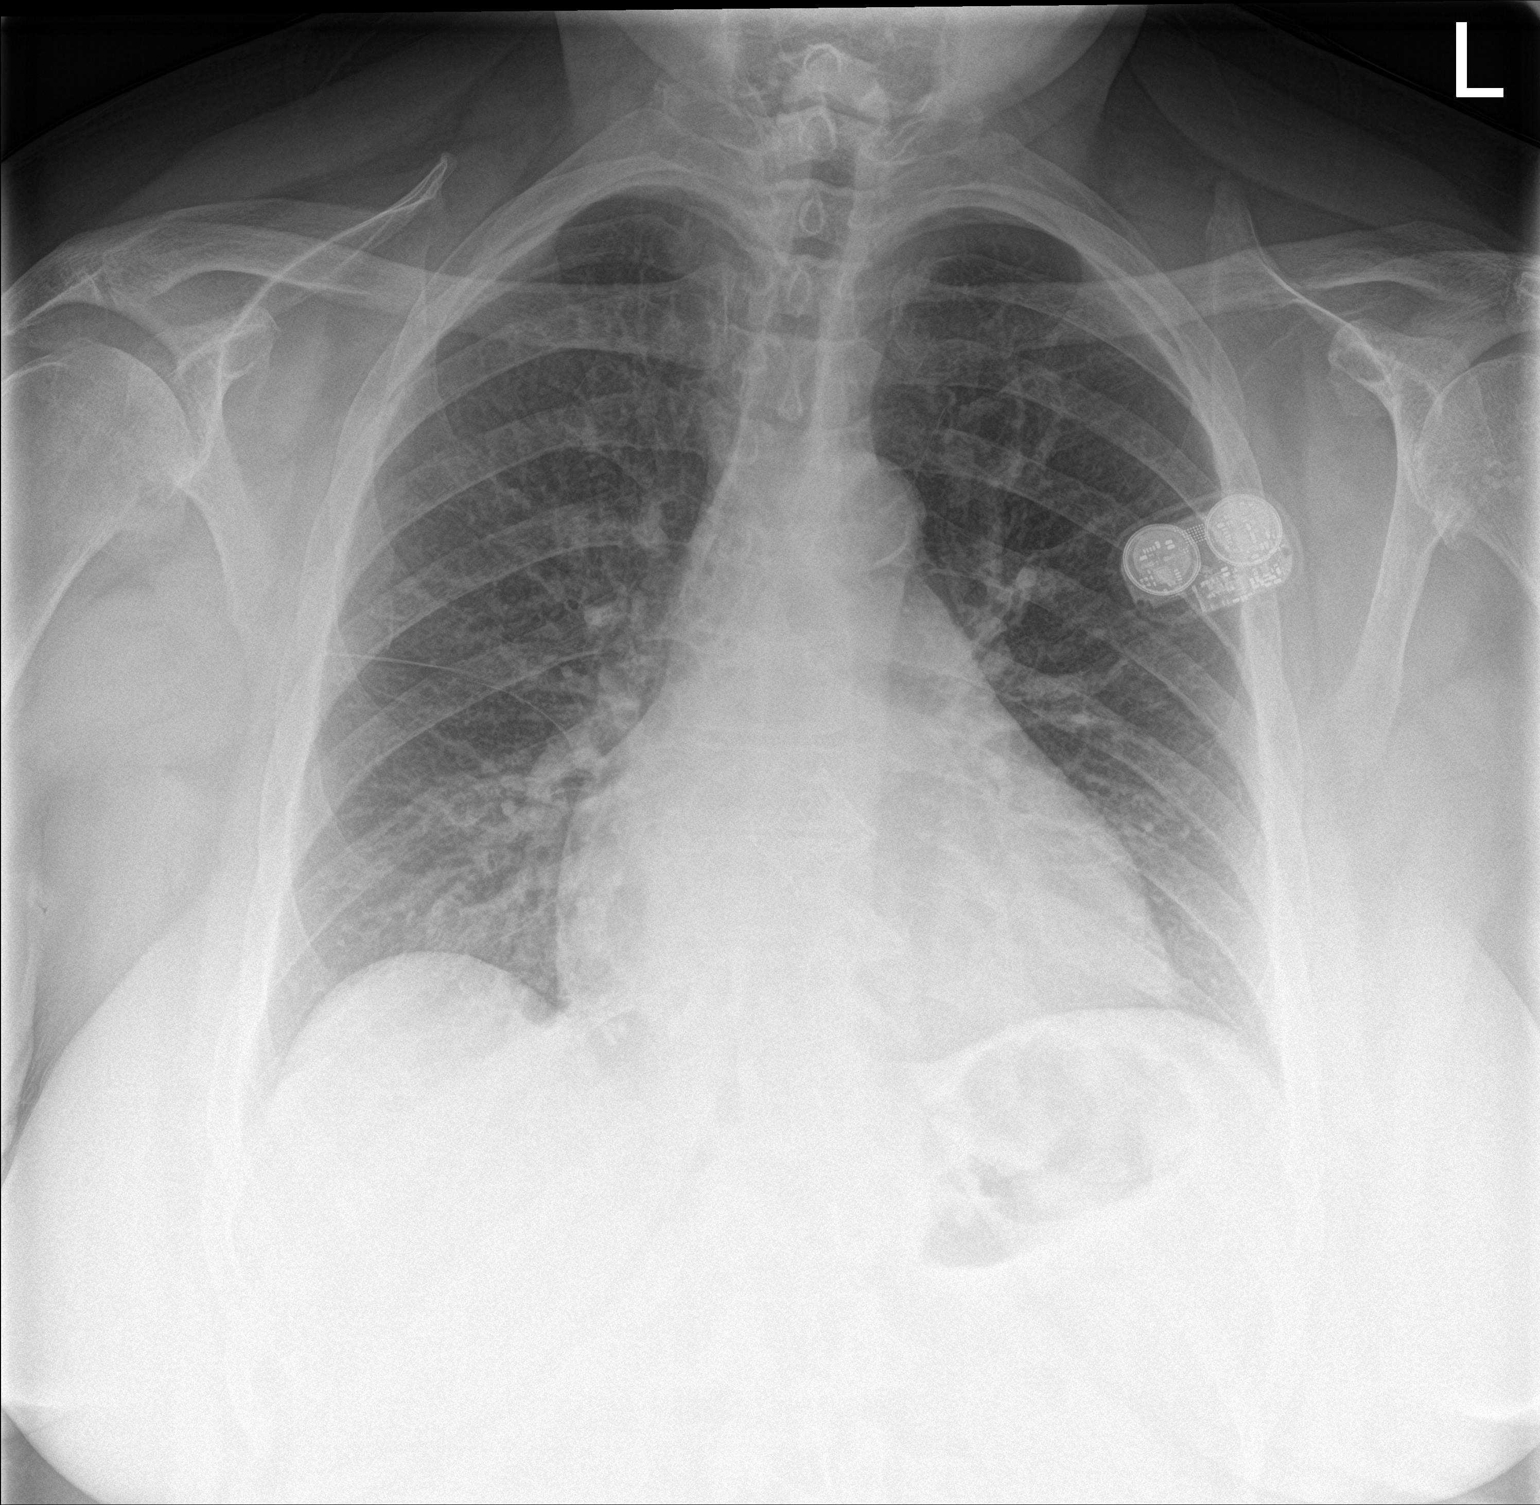

[chest lat]
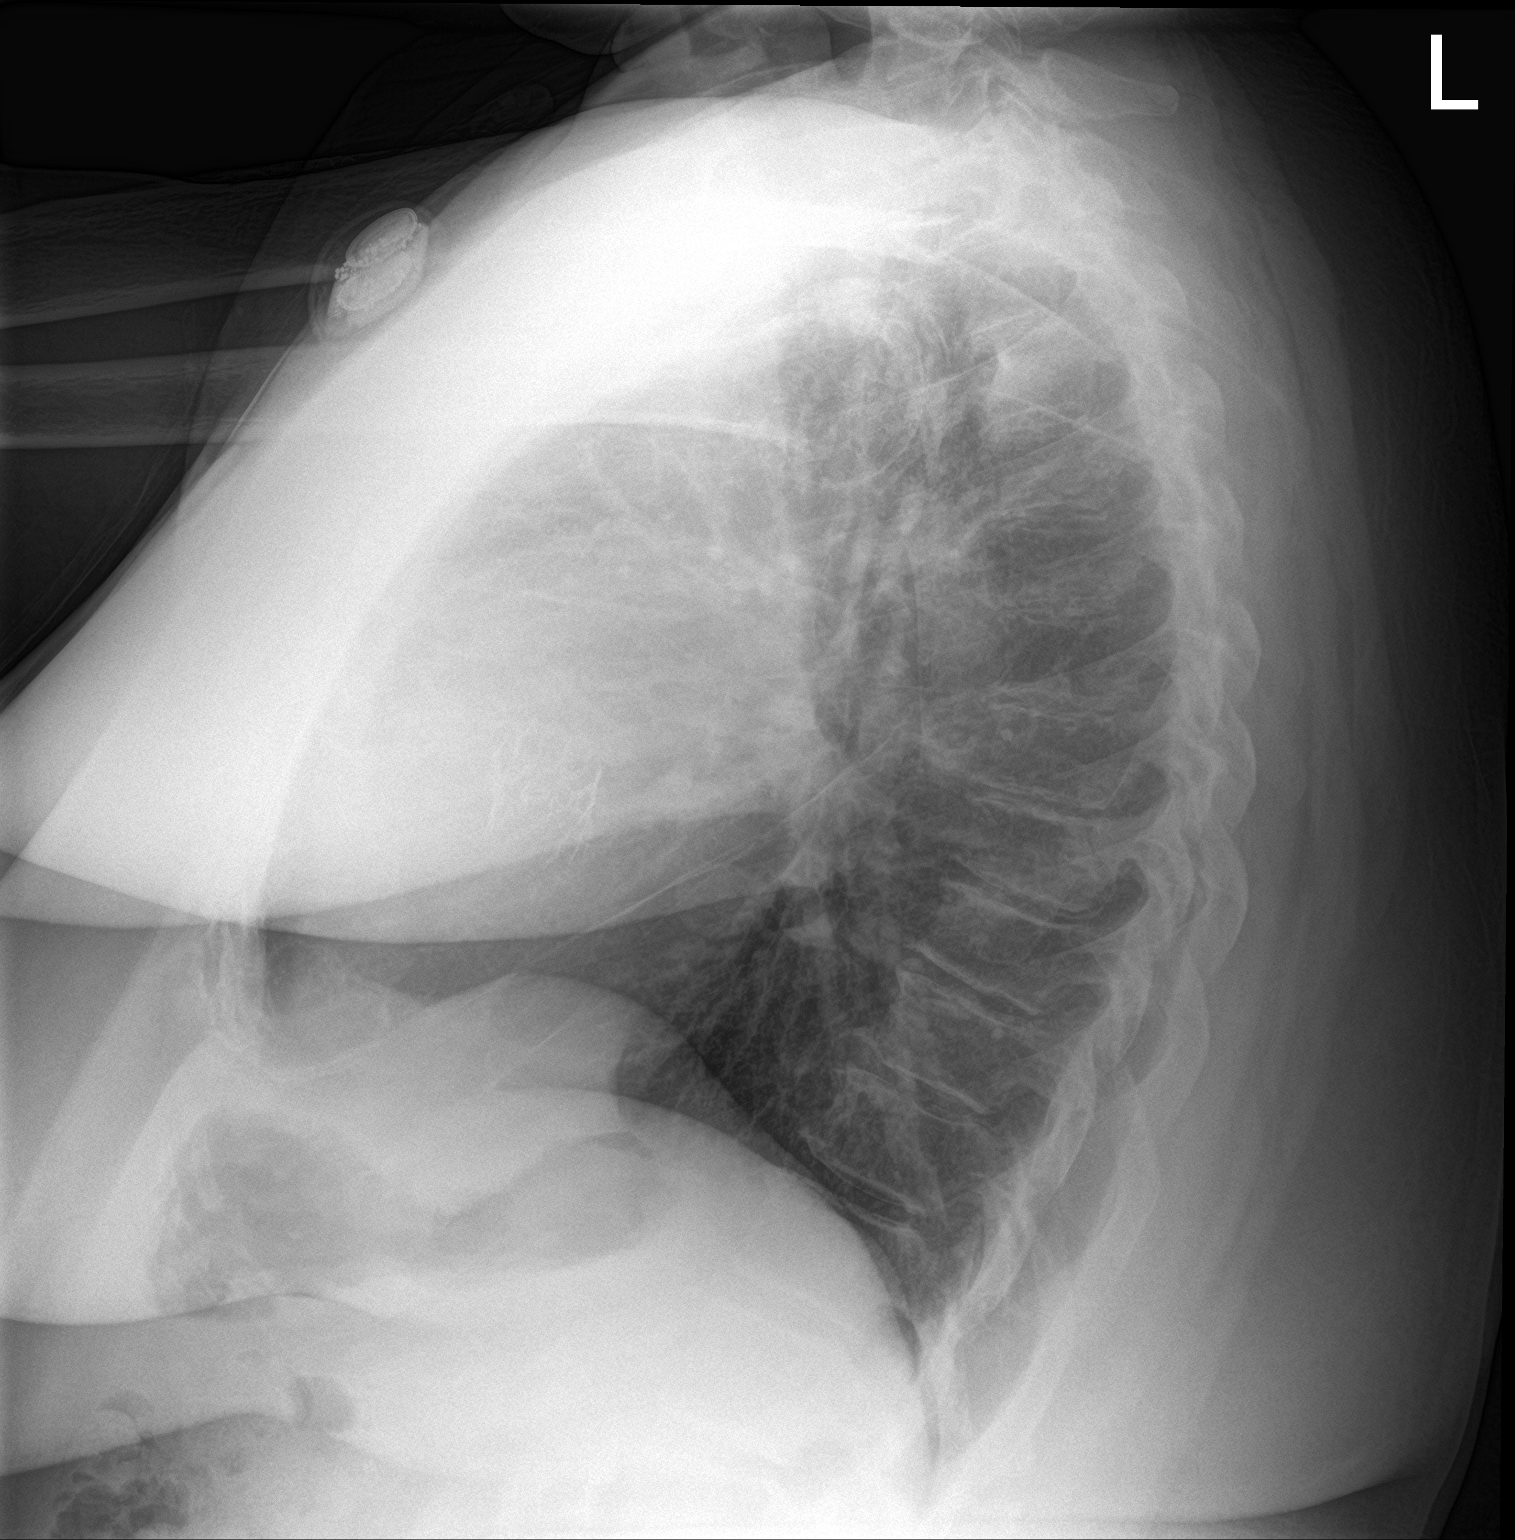

[2 of 2 positions shown; findings below may reference images not displayed]

FINDINGS: Heart is mildly enlarged. Mediastinum appears stable. Calcified
plaques in the aortic arch. Metallic device in the left anterior
chest wall. No focal consolidation identified. No pleural effusion
or pneumothorax.
IMPRESSION: Mild cardiomegaly with no acute process identified.

## 2022-11-10 ENCOUNTER — Other Ambulatory Visit: Payer: Self-pay | Admitting: Internal Medicine

## 2022-11-13 DIAGNOSIS — R600 Localized edema: Secondary | ICD-10-CM | POA: Diagnosis not present

## 2022-11-13 DIAGNOSIS — E1142 Type 2 diabetes mellitus with diabetic polyneuropathy: Secondary | ICD-10-CM | POA: Diagnosis not present

## 2022-11-13 DIAGNOSIS — M2041 Other hammer toe(s) (acquired), right foot: Secondary | ICD-10-CM | POA: Diagnosis not present

## 2022-11-13 DIAGNOSIS — M792 Neuralgia and neuritis, unspecified: Secondary | ICD-10-CM | POA: Diagnosis not present

## 2022-11-13 DIAGNOSIS — B351 Tinea unguium: Secondary | ICD-10-CM | POA: Diagnosis not present

## 2022-11-13 DIAGNOSIS — I739 Peripheral vascular disease, unspecified: Secondary | ICD-10-CM | POA: Diagnosis not present

## 2022-11-16 ENCOUNTER — Other Ambulatory Visit (HOSPITAL_COMMUNITY): Payer: Self-pay

## 2022-11-19 ENCOUNTER — Other Ambulatory Visit: Payer: Self-pay

## 2022-11-19 ENCOUNTER — Other Ambulatory Visit (HOSPITAL_COMMUNITY): Payer: Self-pay

## 2022-11-19 ENCOUNTER — Other Ambulatory Visit: Payer: Self-pay | Admitting: Physician Assistant

## 2022-11-19 DIAGNOSIS — Z952 Presence of prosthetic heart valve: Secondary | ICD-10-CM

## 2022-12-02 ENCOUNTER — Other Ambulatory Visit: Payer: Self-pay | Admitting: Internal Medicine

## 2022-12-02 ENCOUNTER — Other Ambulatory Visit: Payer: Self-pay

## 2022-12-02 ENCOUNTER — Other Ambulatory Visit (HOSPITAL_COMMUNITY): Payer: Self-pay

## 2022-12-02 NOTE — Patient Outreach (Signed)
Care Management   Visit Note  12/02/2022 Name: Rose Miller MRN: [id number] DOB: Sep 05, 1955  Subjective: Rose Miller is a 67 y.o. year old female who is a primary care patient of Garnette Gunner, MD. The Care Management team was consulted for assistance.      Engaged with patient spoke with patient by telephone.    Goals Addressed             This Visit's Progress    COMPLETED: RNCM Care Management  Expected Outcome:  Monitor, Self-Manage and Reduce Symptoms of Diabetes       Current Barriers: The patient has met the goals of care. Continues to work with the pharm D and will let the pharm D know if she needs RNCM support in the future.  Care Coordination needs related to medication cost of DM medications with pharm D referral for 04-10-2022 in a patient with DM Chronic Disease Management support and education needs related to effective management of DM Financial Constraints.  Lab Results  Component Value Date   HGBA1C 5.7 (A) 09/12/2022   HGBA1C 5.7 09/12/2022     Planned Interventions: Provided education to patient about basic DM disease process. The patient saw the pcp and Rybelsus was increased to 14 mg. The patient is working with the pharm D on PAP for dose change. The patient is doing well and denies any new concerns with her DM. She is compliant with medications and pleased with her A1C levels.  Reviewed medications with patient and discussed importance of medication adherence. The patient is compliant with medications. Denies any new needs related to medications. Works with pharm D on a regular basis. ;        Reviewed prescribed diet with patient heart healthy/ADA diet. Is compliant with dietary restrictions. ; Counseled on importance of regular laboratory monitoring as prescribed. Labs are up to date, most recent on 09-12-2022        Discussed plans with patient for ongoing care management follow up and provided patient with direct contact information for care  management team;      Provided patient with written educational materials related to hypo and hyperglycemia and importance of correct treatment. The patient admits she does not check her blood sugars. Reviewed the sx and sx of hypoglycemia and hyperglycemia with the patient. Denies any acute changes in her blood sugars or highs or lows Reviewed scheduled/upcoming provider appointments including: 12-16-2022 at 1040 am;         Advised patient, providing education and rationale, to check cbg when you have symptoms of low or high blood sugar and as directed by provider  and record. Does not check her blood sugars on a regular basis. A1C is stable.      call provider for findings outside established parameters;       Referral made to pharmacy team for assistance with help with medication cost and effective management of DM. Works with the pharm D when needed for medication needs;       Review of patient status, including review of consultants reports, relevant laboratory and other test results, and medications completed;       Advised patient to discuss changes in her DM, questions or concerns with provider;      Screening for signs and symptoms of depression related to chronic disease state;        Assessed social determinant of health barriers;         Symptom Management: Take medications as prescribed  Attend all scheduled provider appointments Call provider office for new concerns or questions  call the Suicide and Crisis Lifeline: 988 call the [place] National Suicide Prevention Lifeline: 878-703-8899 or TTY: 760-080-8837 TTY [phone]) to talk to a trained counselor call 1-800-273-TALK (toll free, 24 hour hotline) go to [facility] [address], [place] [phone]) if experiencing a Mental Health or Behavioral Health Crisis  check feet daily for cuts, sores or redness trim toenails straight across manage portion size wash and dry feet carefully  every day wear comfortable, cotton socks wear comfortable, well-fitting shoes  Follow Up Plan: No further follow up required: the patient has met the goals of care         [facility] Care Management Expected Outcome:  Monitor, Self-Manage and Reduce Symptoms of Afib       Current Barriers: The patient has met the goals of care. Continue to work with the pharm D on a regular basis. Knows to call for changes or new needs. Education and support provided.  Chronic Disease Management support and education needs related to effective management of AFIB  Planned Interventions: Provider order and care plan reviewed. Collaborated with PharmD regarding patient care and plan. The patient works with the pharm D on a regular basis. Denies any acute changes today. Knows to call the provider for new concerns or changes in her AFIB or heart health. Counseled on increased risk of stroke due to Afib and benefits of anticoagulation for stroke prevention           Reviewed importance of adherence to anticoagulant exactly as prescribed. The patient takes Eliquis 5 mg BID. States compliance with medications. Works with pharm D. Advised patient to discuss changes in her AFIB, questions or concerns related to AFIB or heart health with provider Counseled on bleeding risk associated with AFIB and importance of self-monitoring for signs/symptoms of bleeding Counseled on avoidance of NSAIDs due to increased bleeding risk with anticoagulants Counseled on importance of regular laboratory monitoring as prescribed Counseled on seeking medical attention after a head injury or if there is blood in the urine/stool Afib action plan reviewed. The patient saw the cardiologist on 04-05-2022, she saw the pcp recently and got a good report.  No changes in the current plan of care. She states she is doing well and has medications to take if needed for her AFIB. Denies any new concerns.  Screening for signs and symptoms of depression related to  chronic disease state Assessed social determinant of health barriers  Symptom Management: Take medications as prescribed   Attend all scheduled provider appointments Call provider office for new concerns or questions  call the Suicide and Crisis Lifeline: 988 call the [place] National Suicide Prevention Lifeline: 364-314-9253 or TTY: 617-754-7493 TTY 978 303 1075) to talk to a trained counselor call 1-800-273-TALK (toll free, 24 hour hotline) go to [facility] [address] [place] [phone]) if experiencing a Mental Health or Behavioral Health Crisis  - make a plan to eat healthy - keep all lab appointments - take medicine as prescribed  Follow Up Plan: No further follow up required: the patient has met the goals of care             Consent to Services:  Patient was given information about care management services, agreed to services, and gave verbal consent to participate.   Plan: No further follow up required: the patient has met the goals of care  Alto Denver RN, MSN,  CCM RN Care Manager  [facility]  Ambulatory Care Management  Direct Number: [phone]

## 2022-12-02 NOTE — Patient Instructions (Signed)
Visit Information  Thank you for taking time to visit with me today. Please don't hesitate to contact me if I can be of assistance to you before our next scheduled telephone appointment.  Following are the goals we discussed today:   Goals Addressed             This Visit's Progress    COMPLETED: RNCM Care Management  Expected Outcome:  Monitor, Self-Manage and Reduce Symptoms of Diabetes       Current Barriers: The patient has met the goals of care. Continues to work with the pharm D and will let the pharm D know if she needs RNCM support in the future.  Care Coordination needs related to medication cost of DM medications with pharm D referral for 04-10-2022 in a patient with DM Chronic Disease Management support and education needs related to effective management of DM Financial Constraints.  Lab Results  Component Value Date   HGBA1C 5.7 (A) 09/12/2022   HGBA1C 5.7 09/12/2022     Planned Interventions: Provided education to patient about basic DM disease process. The patient saw the pcp and Rybelsus was increased to 14 mg. The patient is working with the pharm D on PAP for dose change. The patient is doing well and denies any new concerns with her DM. She is compliant with medications and pleased with her A1C levels.  Reviewed medications with patient and discussed importance of medication adherence. The patient is compliant with medications. Denies any new needs related to medications. Works with pharm D on a regular basis. ;        Reviewed prescribed diet with patient heart healthy/ADA diet. Is compliant with dietary restrictions. ; Counseled on importance of regular laboratory monitoring as prescribed. Labs are up to date, most recent on 09-12-2022        Discussed plans with patient for ongoing care management follow up and provided patient with direct contact information for care management team;      Provided patient with written educational materials related to hypo and  hyperglycemia and importance of correct treatment. The patient admits she does not check her blood sugars. Reviewed the sx and sx of hypoglycemia and hyperglycemia with the patient. Denies any acute changes in her blood sugars or highs or lows Reviewed scheduled/upcoming provider appointments including: 12-16-2022 at 1040 am;         Advised patient, providing education and rationale, to check cbg when you have symptoms of low or high blood sugar and as directed by provider  and record. Does not check her blood sugars on a regular basis. A1C is stable.      call provider for findings outside established parameters;       Referral made to pharmacy team for assistance with help with medication cost and effective management of DM. Works with the pharm D when needed for medication needs;       Review of patient status, including review of consultants reports, relevant laboratory and other test results, and medications completed;       Advised patient to discuss changes in her DM, questions or concerns with provider;      Screening for signs and symptoms of depression related to chronic disease state;        Assessed social determinant of health barriers;         Symptom Management: Take medications as prescribed   Attend all scheduled provider appointments Call provider office for new concerns or questions  call the Suicide and  Crisis Lifeline: 988 call the [place] National Suicide Prevention Lifeline: (276) 431-0254 or TTY: 620 491 6154 TTY 708-763-6226) to talk to a trained counselor call 1-800-273-TALK (toll free, 24 hour hotline) go to [facility] [address] [place] [phone]) if experiencing a Mental Health or Behavioral Health Crisis  check feet daily for cuts, sores or redness trim toenails straight across manage portion size wash and dry feet carefully every day wear comfortable, cotton socks wear comfortable, well-fitting shoes  Follow Up  Plan: No further follow up required: the patient has met the goals of care         [facility] Care Management Expected Outcome:  Monitor, Self-Manage and Reduce Symptoms of Afib       Current Barriers: The patient has met the goals of care. Continue to work with the pharm D on a regular basis. Knows to call for changes or new needs. Education and support provided.  Chronic Disease Management support and education needs related to effective management of AFIB  Planned Interventions: Provider order and care plan reviewed. Collaborated with PharmD regarding patient care and plan. The patient works with the pharm D on a regular basis. Denies any acute changes today. Knows to call the provider for new concerns or changes in her AFIB or heart health. Counseled on increased risk of stroke due to Afib and benefits of anticoagulation for stroke prevention           Reviewed importance of adherence to anticoagulant exactly as prescribed. The patient takes Eliquis 5 mg BID. States compliance with medications. Works with pharm D. Advised patient to discuss changes in her AFIB, questions or concerns related to AFIB or heart health with provider Counseled on bleeding risk associated with AFIB and importance of self-monitoring for signs/symptoms of bleeding Counseled on avoidance of NSAIDs due to increased bleeding risk with anticoagulants Counseled on importance of regular laboratory monitoring as prescribed Counseled on seeking medical attention after a head injury or if there is blood in the urine/stool Afib action plan reviewed. The patient saw the cardiologist on 04-05-2022, she saw the pcp recently and got a good report.  No changes in the current plan of care. She states she is doing well and has medications to take if needed for her AFIB. Denies any new concerns.  Screening for signs and symptoms of depression related to chronic disease state Assessed social determinant of health barriers  Symptom  Management: Take medications as prescribed   Attend all scheduled provider appointments Call provider office for new concerns or questions  call the Suicide and Crisis Lifeline: 988 call the [place] National Suicide Prevention Lifeline: (631)053-8432 or TTY: 364-493-0568 TTY (276)228-7359) to talk to a trained counselor call 1-800-273-TALK (toll free, 24 hour hotline) go to [facility] [address], [place] [phone]) if experiencing a Mental Health or Behavioral Health Crisis  - make a plan to eat healthy - keep all lab appointments - take medicine as prescribed  Follow Up Plan: No further follow up required: the patient has met the goals of care                Please call the care guide team at [phone] if you need to cancel or reschedule your appointment.   If you are experiencing a Mental Health or Behavioral Health Crisis or need someone to talk to, please call the Suicide and Crisis Lifeline: 988 call the [place] National Suicide Prevention Lifeline: 720-072-7376 or TTY: (509)063-3521 TTY 607-027-5699)  to talk to a trained counselor call 1-800-273-TALK (toll free, 24 hour hotline) go to [facility] [address], [place] [phone])   Patient verbalizes understanding of instructions and care plan provided today and agrees to view in MyChart. Active MyChart status and patient understanding of how to access instructions and care plan via MyChart confirmed with patient.     No further follow up required: the patient has met the goals of care. No further outreach needed at this time  Alto Denver RN, MSN, CCM RN Care Manager  [facility]  Ambulatory Care Management  Direct Number: [phone]

## 2022-12-04 ENCOUNTER — Other Ambulatory Visit (HOSPITAL_COMMUNITY): Payer: Self-pay

## 2022-12-06 ENCOUNTER — Other Ambulatory Visit: Payer: Self-pay | Admitting: Internal Medicine

## 2022-12-06 ENCOUNTER — Other Ambulatory Visit (HOSPITAL_COMMUNITY): Payer: Self-pay

## 2022-12-09 ENCOUNTER — Ambulatory Visit
Admission: RE | Admit: 2022-12-09 | Discharge: 2022-12-09 | Disposition: A | Discharge status: Home / Self Care | Payer: HMO | Source: Ambulatory Visit | Attending: Family Medicine

## 2022-12-09 DIAGNOSIS — Z1231 Encounter for screening mammogram for malignant neoplasm of breast: Secondary | ICD-10-CM | POA: Diagnosis not present

## 2022-12-16 ENCOUNTER — Encounter: Payer: Self-pay | Admitting: Family Medicine

## 2022-12-16 ENCOUNTER — Ambulatory Visit (INDEPENDENT_AMBULATORY_CARE_PROVIDER_SITE_OTHER): Payer: HMO | Admitting: Family Medicine

## 2022-12-16 VITALS — BP 132/78 | HR 79 | Temp 97.8°F | Wt 361.0 lb

## 2022-12-16 DIAGNOSIS — R0609 Other forms of dyspnea: Secondary | ICD-10-CM

## 2022-12-16 DIAGNOSIS — I152 Hypertension secondary to endocrine disorders: Secondary | ICD-10-CM

## 2022-12-16 DIAGNOSIS — H9202 Otalgia, left ear: Secondary | ICD-10-CM

## 2022-12-16 DIAGNOSIS — E559 Vitamin D deficiency, unspecified: Secondary | ICD-10-CM | POA: Diagnosis not present

## 2022-12-16 DIAGNOSIS — I700 Atherosclerosis of aorta: Secondary | ICD-10-CM

## 2022-12-16 DIAGNOSIS — E1122 Type 2 diabetes mellitus with diabetic chronic kidney disease: Secondary | ICD-10-CM | POA: Diagnosis not present

## 2022-12-16 DIAGNOSIS — E1159 Type 2 diabetes mellitus with other circulatory complications: Secondary | ICD-10-CM | POA: Diagnosis not present

## 2022-12-16 DIAGNOSIS — L600 Ingrowing nail: Secondary | ICD-10-CM | POA: Insufficient documentation

## 2022-12-16 DIAGNOSIS — Z7984 Long term (current) use of oral hypoglycemic drugs: Secondary | ICD-10-CM

## 2022-12-16 DIAGNOSIS — I359 Nonrheumatic aortic valve disorder, unspecified: Secondary | ICD-10-CM | POA: Diagnosis not present

## 2022-12-16 DIAGNOSIS — I2510 Atherosclerotic heart disease of native coronary artery without angina pectoris: Secondary | ICD-10-CM

## 2022-12-16 DIAGNOSIS — J431 Panlobular emphysema: Secondary | ICD-10-CM

## 2022-12-16 DIAGNOSIS — E1169 Type 2 diabetes mellitus with other specified complication: Secondary | ICD-10-CM | POA: Diagnosis not present

## 2022-12-16 DIAGNOSIS — Z6841 Body Mass Index (BMI) 40.0 and over, adult: Secondary | ICD-10-CM

## 2022-12-16 DIAGNOSIS — M545 Low back pain, unspecified: Secondary | ICD-10-CM | POA: Insufficient documentation

## 2022-12-16 DIAGNOSIS — E782 Mixed hyperlipidemia: Secondary | ICD-10-CM | POA: Diagnosis not present

## 2022-12-16 DIAGNOSIS — N183 Chronic kidney disease, stage 3 unspecified: Secondary | ICD-10-CM | POA: Diagnosis not present

## 2022-12-16 DIAGNOSIS — E785 Hyperlipidemia, unspecified: Secondary | ICD-10-CM

## 2022-12-16 DIAGNOSIS — M25561 Pain in right knee: Secondary | ICD-10-CM

## 2022-12-16 DIAGNOSIS — E66813 Obesity, class 3: Secondary | ICD-10-CM

## 2022-12-16 DIAGNOSIS — G8929 Other chronic pain: Secondary | ICD-10-CM | POA: Insufficient documentation

## 2022-12-16 DIAGNOSIS — M25562 Pain in left knee: Secondary | ICD-10-CM

## 2022-12-16 DIAGNOSIS — R3121 Asymptomatic microscopic hematuria: Secondary | ICD-10-CM

## 2022-12-16 LAB — COMPREHENSIVE METABOLIC PANEL
ALT: 17 U/L (ref 0–35)
AST: 17 U/L (ref 0–37)
Albumin: 4.1 g/dL (ref 3.5–5.2)
Alkaline Phosphatase: 85 U/L (ref 39–117)
BUN: 14 mg/dL (ref 6–23)
CO2: 28 meq/L (ref 19–32)
Calcium: 9.2 mg/dL (ref 8.4–10.5)
Chloride: 102 meq/L (ref 96–112)
Creatinine, Ser: 1.34 mg/dL — ABNORMAL HIGH (ref 0.40–1.20)
GFR: 40.61 mL/min — ABNORMAL LOW (ref >60.00)
Glucose, Bld: 107 mg/dL — ABNORMAL HIGH (ref 70–99)
Potassium: 3.8 meq/L (ref 3.5–5.1)
Sodium: 139 meq/L (ref 135–145)
Total Bilirubin: 0.5 mg/dL (ref 0.2–1.2)
Total Protein: 7.7 g/dL (ref 6.0–8.3)

## 2022-12-16 LAB — CBC WITH DIFFERENTIAL/PLATELET
Basophils Absolute: 0 10*3/uL (ref 0.0–0.1)
Basophils Relative: 0.6 % (ref 0.0–3.0)
Eosinophils Absolute: 0.1 10*3/uL (ref 0.0–0.7)
Eosinophils Relative: 2 % (ref 0.0–5.0)
HCT: 37.9 % (ref 36.0–46.0)
Hemoglobin: 12.1 g/dL (ref 12.0–15.0)
Lymphocytes Relative: 44.1 % (ref 12.0–46.0)
Lymphs Abs: 2 10*3/uL (ref 0.7–4.0)
MCHC: 31.9 g/dL (ref 30.0–36.0)
MCV: 82.4 fL (ref 78.0–100.0)
Monocytes Absolute: 0.5 10*3/uL (ref 0.1–1.0)
Monocytes Relative: 11.5 % (ref 3.0–12.0)
Neutro Abs: 1.9 10*3/uL (ref 1.4–7.7)
Neutrophils Relative %: 41.8 % — ABNORMAL LOW (ref 43.0–77.0)
Platelets: 210 10*3/uL (ref 150.0–400.0)
RBC: 4.6 Mil/uL (ref 3.87–5.11)
RDW: 17.2 % — ABNORMAL HIGH (ref 11.5–15.5)
WBC: 4.6 10*3/uL (ref 4.0–10.5)

## 2022-12-16 LAB — POCT GLYCOSYLATED HEMOGLOBIN (HGB A1C): Hemoglobin A1C: 5.9 % — AB (ref 4.0–5.6)

## 2022-12-16 LAB — LIPID PANEL
Cholesterol: 133 mg/dL (ref 0–200)
HDL: 43.4 mg/dL (ref >39.00)
LDL Cholesterol: 66 mg/dL (ref 0–99)
NonHDL: 89.22
Total CHOL/HDL Ratio: 3
Triglycerides: 114 mg/dL (ref 0.0–149.0)
VLDL: 22.8 mg/dL (ref 0.0–40.0)

## 2022-12-16 LAB — MICROALBUMIN / CREATININE URINE RATIO
Creatinine,U: 223.1 mg/dL
Microalb Creat Ratio: 1.2 mg/g (ref 0.0–30.0)
Microalb, Ur: 2.6 mg/dL — ABNORMAL HIGH (ref 0.0–1.9)

## 2022-12-16 LAB — TSH: TSH: 1.78 u[IU]/mL (ref 0.35–5.50)

## 2022-12-16 NOTE — Assessment & Plan Note (Signed)
Managing in the context of diabetes.   Plan:  Continue to monitor renal function with routine blood work. Encourage hydration and control of blood pressure to support renal health. Avoid NSAIDs

## 2022-12-16 NOTE — Assessment & Plan Note (Addendum)
Patient's diabetes is well controlled on Ribelsus and Jardiance  Plan  Continue Rybelsus 14 mg daily and Jardiance 10 mg daily. Monitor blood glucose levels at home. Continue semaglutide (Rybelsus) 14 mg daily. Continue Jardiance 10 mg daily. Monitor blood glucose at home. Obtain labs today including metabolic panel, lipid panel, liver function tests, kidney function tests, HbA1c, thyroid function, vitamin D levels, urine protein/creatinine ratio. Assist patient with reapplication for medication assistance programs. Coordinate with Elnita Maxwell and the pharmacy team to ensure medications are accessible. Follow-up in 3 months for re-evaluation.

## 2022-12-16 NOTE — Assessment & Plan Note (Signed)
Patient reports intermittent shortness of breath occurring after showers and during activity associated with back pain.    Plan: Order BNP lab test to assess for heart failure. Recommend follow-up with cardiology (echocardiogram scheduled on December 27, 2022). Referral to pulmonology for evaluation of emphysema and intermittent dyspnea.

## 2022-12-16 NOTE — Assessment & Plan Note (Signed)
Patient has history of smoking.  Emphysematous changes seen on lung cancer CT screening.  Presents with ongoing dyspnea  Plan: Referral to pulmonology for assessment of breathing. Annual lung cancer screening recommended.

## 2022-12-16 NOTE — Assessment & Plan Note (Signed)
Patient reports ongoing left ear discomfort; examination reveals no significant findings.  Plan: Recommend referral to ENT

## 2022-12-16 NOTE — Patient Instructions (Addendum)
-   Continue taking all your medications as prescribed, including Rybelsus, Jardiance, and your blood pressure medications. - Complete the lab tests (blood and urine tests) to check your kidney function, cholesterol, and other levels. - Attend your upcoming echocardiogram appointment  - Schedule an appointment with your heart doctor and lung specialist (pulmonologist) to evaluate your occasional shortness of breath. - Stay in touch with the pharmacy assistance program to complete any necessary paperwork for your medications. - Follow up if shortness of breath worsens, go ED if severe symptoms occur - Follow up to discuss knee pain, back pain, and ear pain

## 2022-12-16 NOTE — Assessment & Plan Note (Signed)
Patient reports bilateral knee pain and lower back pain; pain is intermittent and currently not severe.  Plan: Plan to address musculoskeletal complaints in a future visit to focus adequately on these issues. Consider imaging studies if symptoms persist or worsen.

## 2022-12-16 NOTE — Progress Notes (Signed)
Assessment/Plan:   Problem List Items Addressed This Visit       Cardiovascular and Mediastinum   Hypertension associated with diabetes (HCC)   Aortic valve disease   Relevant Orders   TSH   Pro b natriuretic peptide   Coronary artery disease due to calcified coronary lesion   Aortic atherosclerosis (HCC)     Respiratory   COPD (chronic obstructive pulmonary disease) (HCC)    Patient has history of smoking.  Emphysematous changes seen on lung cancer CT screening.  Presents with ongoing dyspnea  Plan: Referral to pulmonology for assessment of breathing. Annual lung cancer screening recommended.      Relevant Orders   Ambulatory referral to Pulmonology     Endocrine   Type 2 diabetes mellitus with obesity (HCC) - Primary    Patient's diabetes is well controlled on Ribelsus and Jardiance  Plan  Continue Rybelsus 14 mg daily and Jardiance 10 mg daily. Monitor blood glucose levels at home. Continue semaglutide (Rybelsus) 14 mg daily. Continue Jardiance 10 mg daily. Monitor blood glucose at home. Obtain labs today including metabolic panel, lipid panel, liver function tests, kidney function tests, HbA1c, thyroid function, vitamin D levels, urine protein/creatinine ratio. Assist patient with reapplication for medication assistance programs. Coordinate with Elnita Maxwell and the pharmacy team to ensure medications are accessible. Follow-up in 3 months for re-evaluation.      Relevant Orders   POCT glycosylated hemoglobin (Hb A1C) (Completed)   AMB Referral [facility] Care Management   Microalbumin / creatinine urine ratio   CKD stage 3 secondary to diabetes [facility])    Managing in the context of diabetes.   Plan:  Continue to monitor renal function with routine blood work. Encourage hydration and control of blood pressure to support renal health. Avoid NSAIDs      Relevant Orders   Comprehensive metabolic panel   Urinalysis w microscopic + reflex cultur     Musculoskeletal  and Integument   Ingrown nail of great toe of left foot     Other   Hyperlipidemia LDL goal <70   Class 3 severe obesity due to excess calories with body mass index (BMI) of 50.0 to 59.9 in adult [facility])   Otalgia of left ear    Patient reports ongoing left ear discomfort; examination reveals no significant findings.  Plan: Recommend referral to ENT       Relevant Orders   Ambulatory referral to ENT   Vitamin D deficiency   Relevant Orders   Vitamin D 1,25 dihydroxy   Low back pain   Acute pain of both knees    Patient reports bilateral knee pain and lower back pain; pain is intermittent and currently not severe.  Plan: Plan to address musculoskeletal complaints in a future visit to focus adequately on these issues. Consider imaging studies if symptoms persist or worsen.      Dyspnea on exertion    Patient reports intermittent shortness of breath occurring after showers and during activity associated with back pain.    Plan: Order BNP lab test to assess for heart failure. Recommend follow-up with cardiology (echocardiogram scheduled on December 27, 2022). Referral to pulmonology for evaluation of emphysema and intermittent dyspnea.      Relevant Orders   CBC with Differential/Platelet   Pro b natriuretic peptide   Ambulatory referral to Pulmonology   Other Visit Diagnoses     Mixed hyperlipidemia       Relevant Orders   Lipid panel  There are no discontinued medications.  Return in about 3 months (around 03/16/2023) for BP, DM.    Subjective:   Encounter date: 12/16/2022  Rose Miller is a 67 y.o. female who has Ventral hernia; IT band syndrome; Right shoulder pain; Periumbilical pain; Hyperlipidemia LDL goal <70; Class 3 severe obesity due to excess calories with body mass index (BMI) of 50.0 to 59.9 in adult [facility]); Type 2 diabetes mellitus with obesity (HCC); CKD stage 3 secondary to diabetes (HCC); Postmenopausal estrogen deficiency; Former smoker;  Long-term use of aspirin therapy; Hypertension associated with diabetes (HCC); Aortic valve disease; COPD (chronic obstructive pulmonary disease) (HCC); Pharyngoesophageal dysphagia; Family history of colon cancer in father; Diplopia; Allergic rhinitis; PAF (paroxysmal atrial fibrillation) (HCC); Otalgia of left ear; Ingrown nail of great toe of left foot; Vitamin D deficiency; Low back pain; Acute pain of both knees; Dyspnea on exertion; Coronary artery disease due to calcified coronary lesion; and Aortic atherosclerosis (HCC) on their problem list..   She  has a past medical history of Adhesive capsulitis, Aortic valve disease, Arthritis, Asthma, Back pain, CKD (chronic kidney disease) stage 3, GFR 30-59 ml/min (HCC), Constipation, COPD (chronic obstructive pulmonary disease) (HCC), Diabetes mellitus without complication (HCC), Diuretic-induced hypokalemia, Hypercholesterolemia, Hypertension, IT band syndrome, Morbid obesity (HCC), Palpitations, Periumbilical pain, Postmenopausal estrogen deficiency, Prediabetes, S/P TAVR (transcatheter aortic valve replacement) (12/26/2020), and Ventral hernia..   Chief Complaint: Follow-up for diabetes management and evaluation of knee pain, lower back pain, and left ear discomfort.  History of Present Illness:  Diabetes Mellitus: The patient is here for a follow-up visit to manage her diabetes mellitus. She reports having an A1C of 5.9% today. The patient continues on semaglutide (Rybelsus) at 14 mg PO daily and Jardiance at 10 mg daily. She reports that she hasn't experienced significant weight loss but feels subjectively different. Her home monitoring showing blood glucose levels between 100 to 129 mg/dL. She hasn't had any episodes of hypoglycemia. No signs of polyuria or polydipsia.  Hypertension: The patient is monitoring her blood pressure, with home readings averaging around 115/70 mmHg.  She has occasional leg swelling that is improved with as needed Lasix.   She endorses taking all of her blood pressure medications including amlodipine 10 mg, spironolactone 25 mg, hydralazine 50 mg 3 times a day, losartan 100 mg daily, metoprolol 25 mg daily.  She denies chest pain, palpitations, headaches, blurry vision.  Knee and Back Pain: The patient reports bilateral knee pain and lower back pain. The pain is intermittent and sometimes associated with shortness of breath during activity. She notes that the back pain can sometimes make her feel out of breath.  Left Ear Discomfort: The patient mentions that her left ear is still bothering her. No improvement after ciprofloxicin drops.   Shortness of Breath: The patient reports occasional shortness of breath, occurring sometimes after taking a shower and sometimes when she experiences back pain while walking.  She reports that she is able to walk to her mailbox and back without any shortness of breath.  She denies any cough or wheezing.  She has a history of smoking but no longer smokes.  Emphysema.  Patient with emphysematous changes on CT chest for lung cancer screening.  Also showed possible pulmonary hypertension.  Patient with evidence of atherosclerosis on both coronary arteries and aorta  Heart disease.  Includes aortic valvular disease status post TAVR, paroxysmal atrial fibrillation.  Has upcoming cardiology appointment with new cardiologist in March 2025.  Also has upcoming echocardiogram on  December 27, 2022.  Lipid management with atorvastatin 10 mg with goal under 70.  Recent panel in February slightly above goal with LDL at 72.    Past Surgical History:  Procedure Laterality Date   BIOPSY  05/01/2021   Procedure: BIOPSY;  Surgeon: Lynann Bologna, MD;  Location: [facility];  Service: Gastroenterology;;   CESAREAN SECTION  1989   COLONOSCOPY     COLONOSCOPY WITH PROPOFOL N/A 05/01/2021   Procedure: COLONOSCOPY WITH PROPOFOL;  Surgeon: Lynann Bologna, MD;  Location: [facility];  Service:  Gastroenterology;  Laterality: N/A;   ESOPHAGOGASTRODUODENOSCOPY (EGD) WITH PROPOFOL N/A 05/01/2021   Procedure: ESOPHAGOGASTRODUODENOSCOPY (EGD) WITH PROPOFOL;  Surgeon: Lynann Bologna, MD;  Location: [facility];  Service: Gastroenterology;  Laterality: N/A;   INTRAOPERATIVE TRANSTHORACIC ECHOCARDIOGRAM N/A 12/26/2020   Procedure: INTRAOPERATIVE TRANSTHORACIC ECHOCARDIOGRAM;  Surgeon: Orbie Pyo, MD;  Location: [facility] INVASIVE CV LAB;  Service: Open Heart Surgery;  Laterality: N/A;   MALONEY DILATION  05/01/2021   Procedure: Elease Hashimoto DILATION;  Surgeon: Lynann Bologna, MD;  Location: [facility];  Service: Gastroenterology;;   POLYPECTOMY  05/01/2021   Procedure: POLYPECTOMY;  Surgeon: Lynann Bologna, MD;  Location: [facility];  Service: Gastroenterology;;   RIGHT/LEFT HEART CATH AND CORONARY ANGIOGRAPHY N/A 11/24/2020   Procedure: RIGHT/LEFT HEART CATH AND CORONARY ANGIOGRAPHY;  Surgeon: Lyn Records, MD;  Location: [facility] INVASIVE CV LAB;  Service: Cardiovascular;  Laterality: N/A;   TONSILLECTOMY AND ADENOIDECTOMY  1960   TRANSCATHETER AORTIC VALVE REPLACEMENT, TRANSFEMORAL N/A 12/26/2020   Procedure: TRANSCATHETER AORTIC VALVE REPLACEMENT, TRANSFEMORAL;  Surgeon: Orbie Pyo, MD;  Location: [facility] INVASIVE CV LAB;  Service: Open Heart Surgery;  Laterality: N/A;    Outpatient Medications Prior to Visit  Medication Sig Dispense Refill   amLODipine (NORVASC) 10 MG tablet Take 1 tablet (10 mg total) by mouth daily. 90 tablet 3   amoxicillin (AMOXIL) 500 MG tablet TAKE 4 TABLETS 1 HOUR PRIORTO DENTAL PROCEDURES AND   CLEANINGS 12 tablet 6   apixaban (ELIQUIS) 5 MG TABS tablet Take 1 tablet (5 mg total) by mouth 2 (two) times daily. 180 tablet 1   Ascorbic Acid (VITAMIN C) 1000 MG tablet Take 1,000 mg by mouth daily.     atorvastatin (LIPITOR) 10 MG tablet Take 1 tablet (10 mg total) by mouth daily. 90 tablet 3   Blood Glucose Monitoring Suppl (ONE TOUCH ULTRA 2) w/Device KIT Use as directed in  the morning, at noon, and at bedtime. 1 kit 0   Cholecalciferol 50 MCG (2000 UT) TABS Take 2,000 Units by mouth daily.     Coenzyme Q10 (CO Q-10) 100 MG CAPS Take 100 mg by mouth daily.      Cyanocobalamin (B-12) 500 MCG TABS Take 500 mcg by mouth daily.     diltiazem (CARDIZEM) 30 MG tablet Take 1 tablet (30 mg total) by mouth every 6 (six) hours as needed (fast heart rate, heart palpitations). 30 tablet 6   empagliflozin (JARDIANCE) 10 MG TABS tablet Take 1 tablet (10 mg total) by mouth daily before breakfast. 90 tablet 0   fluticasone (FLONASE) 50 MCG/ACT nasal spray Place 2 sprays into both nostrils daily. 16 g 3   furosemide (LASIX) 20 MG tablet Take 1 tablet (20 mg total) by mouth daily as needed (for lower extremity swelling). 30 tablet 4   Glucose Blood (BLOOD GLUCOSE TEST STRIPS) STRP Use 1 in the morning, at noon, and at bedtime. 100 strip 0   hydrALAZINE (APRESOLINE) 50 MG tablet Take 1 tablet (  50 mg total) by mouth 3 (three) times daily. 270 tablet 3   ipratropium (ATROVENT) 0.06 % nasal spray Place 2 sprays into both nostrils 4 (four) times daily. 15 mL 12   losartan (COZAAR) 100 MG tablet Take 1 tablet (100 mg total) by mouth daily. Discontinue losartan/hctz 90 tablet 3   Menthol, Topical Analgesic, (BLUE-EMU MAXIMUM STRENGTH) 2.5 % LIQD Apply 1 application topically daily as needed (pain).     metoprolol succinate (TOPROL-XL) 25 MG 24 hr tablet Take 1 tablet (25 mg total) by mouth daily. 90 tablet 3   montelukast (SINGULAIR) 10 MG tablet TAKE 1 TABLET BY MOUTH AT BEDTIME 30 tablet 0   mupirocin ointment (BACTROBAN) 2 % Apply 1 Application topically 2 (two) times daily for 14 days 22 g 3   Olopatadine HCl 0.2 % SOLN Apply 1 drop to eye in the morning and at bedtime. 2.5 mL 3   omeprazole (PRILOSEC) 20 MG capsule Take 1 capsule (20 mg total) by mouth daily. 30 capsule 11   OneTouch Delica Lancets 33G MISC Use 1 lancet in the morning, at noon, and at bedtime. 100 each 0   Semaglutide  (RYBELSUS) 14 MG TABS Take 1 tablet (14 mg total) by mouth daily. 90 tablet 3   spironolactone (ALDACTONE) 25 MG tablet Take 1 tablet (25 mg total) by mouth daily. 90 tablet 2   Zinc 50 MG TABS Take 50 mg by mouth daily.     No facility-administered medications prior to visit.    Family History  Problem Relation Age of Onset   Dementia Mother    Arthritis Mother    Cancer Mother        pancreatic   Hypertension Mother    Obesity Mother    Cancer Father        prostate and colon   Colon cancer Father    Heart disease Father    Thyroid disease Father    Breast cancer Neg Hx     Social History   Socioeconomic History   Marital status: Single    Spouse name: Not on file   Number of children: 1   Years of education: Not on file   Highest education level: Not on file  Occupational History   Occupation: retired  Tobacco Use   Smoking status: Former    Current packs/day: 0.00    Average packs/day: 1 pack/day for 38.0 years (38.0 ttl pk-yrs)    Types: Cigarettes    Start date: 21    Quit date: 2013    Years since quitting: 11.9    Passive exposure: Never   Smokeless tobacco: Never  Vaping Use   Vaping status: Never Used  Substance and Sexual Activity   Alcohol use: Yes    Comment: socially   Drug use: No   Sexual activity: Not on file  Other Topics Concern   Not on file  Social History Narrative   Not on file   Social Determinants of Health   Financial Resource Strain: Low Risk  (12/13/2022)   Overall Financial Resource Strain (CARDIA)    Difficulty of Paying Living Expenses: Not very hard  Food Insecurity: No Food Insecurity (12/13/2022)   Hunger Vital Sign    Worried About Running Out of Food in the Last Year: Never true    Ran Out of Food in the Last Year: Never true  Transportation Needs: No Transportation Needs (12/13/2022)   PRAPARE - Administrator, Civil Service (Medical): No  Lack of Transportation (Non-Medical): No  Physical  Activity: Inactive (12/13/2022)   Exercise Vital Sign    Days of Exercise per Week: 0 days    Minutes of Exercise per Session: 0 min  Stress: No Stress Concern Present (12/13/2022)   [employer] of Occupational Health - Occupational Stress Questionnaire    Feeling of Stress : Only a little  Social Connections: Unknown (12/13/2022)   Social Connection and Isolation Panel [NHANES]    Frequency of Communication with Friends and Family: More than three times a week    Frequency of Social Gatherings with Friends and Family: Once a week    Attends Religious Services: Never    Database administrator or Organizations: No    Attends Banker Meetings: Never    Marital Status: Patient declined  Intimate Partner Violence: Not At Risk (09/23/2022)   Humiliation, Afraid, Rape, and Kick questionnaire    Fear of Current or Ex-Partner: No    Emotionally Abused: No    Physically Abused: No    Sexually Abused: No                                                                                                  Objective:  Physical Exam: BP 132/78 (BP Location: Left Arm, Patient Position: Sitting, Cuff Size: Large)   Pulse 79   Temp 97.8 F (36.6 C) (Temporal)   Wt (!) 361 lb (163.7 kg)   SpO2 99%   BMI 51.80 kg/m   Wt Readings from Last 3 Encounters:  12/16/22 (!) 361 lb (163.7 kg)  09/12/22 (!) 361 lb (163.7 kg)  06/12/22 (!) 363 lb 6.4 oz (164.8 kg)     Physical Exam Constitutional:      General: She is not in acute distress.    Appearance: Normal appearance. She is not ill-appearing or toxic-appearing.  HENT:     Head: Normocephalic and atraumatic.     Nose: Nose normal. No congestion.  Eyes:     General: No scleral icterus.    Extraocular Movements: Extraocular movements intact.  Cardiovascular:     Rate and Rhythm: Normal rate and regular rhythm.     Pulses: Normal pulses.     Heart sounds: Murmur heard.  Pulmonary:     Effort: Pulmonary effort is normal. No  respiratory distress.     Breath sounds: Normal breath sounds.  Abdominal:     General: Abdomen is flat. Bowel sounds are normal.     Palpations: Abdomen is soft.  Musculoskeletal:        General: Normal range of motion.  Lymphadenopathy:     Cervical: No cervical adenopathy.  Skin:    General: Skin is warm and dry.     Findings: No rash.  Neurological:     General: No focal deficit present.     Mental Status: She is alert and oriented to person, place, and time. Mental status is at baseline.  Psychiatric:        Mood and Affect: Mood normal.        Behavior: Behavior normal.  Thought Content: Thought content normal.        Judgment: Judgment normal.     MM 3D SCREENING MAMMOGRAM BILATERAL BREAST  Result Date: 12/10/2022 CLINICAL DATA:  Screening. EXAM: DIGITAL SCREENING BILATERAL MAMMOGRAM WITH TOMOSYNTHESIS AND CAD TECHNIQUE: Bilateral screening digital craniocaudal and mediolateral oblique mammograms were obtained. Bilateral screening digital breast tomosynthesis was performed. The images were evaluated with computer-aided detection. COMPARISON:  Previous exam(s). ACR Breast Density Category b: There are scattered areas of fibroglandular density. FINDINGS: There are no findings suspicious for malignancy. IMPRESSION: No mammographic evidence of malignancy. A result letter of this screening mammogram will be mailed directly to the patient. RECOMMENDATION: Screening mammogram in one year. (Code:SM-B-01Y) BI-RADS CATEGORY  1: Negative. Electronically Signed   By: Elberta Fortis M.D.   On: 12/10/2022 10:49    Recent Results (from the past 2160 hour(s))  POCT glycosylated hemoglobin (Hb A1C)     Status: Abnormal   Collection Time: 12/16/22 10:59 AM  Result Value Ref Range   Hemoglobin A1C 5.9 (A) 4.0 - 5.6 %   HbA1c POC (<> result, manual entry)     HbA1c, POC (prediabetic range)     HbA1c, POC (controlled diabetic range)          Garner Nash, MD, MS

## 2022-12-17 LAB — URINALYSIS W MICROSCOPIC + REFLEX CULTURE
Bacteria, UA: NONE SEEN /[HPF]
Bilirubin Urine: NEGATIVE
Hgb urine dipstick: NEGATIVE
Ketones, ur: NEGATIVE
Leukocyte Esterase: NEGATIVE
Nitrites, Initial: NEGATIVE
Protein, ur: NEGATIVE
Specific Gravity, Urine: 1.023 (ref 1.001–1.035)
WBC, UA: NONE SEEN /[HPF] (ref 0–5)
pH: 5.5 (ref 5.0–8.0)

## 2022-12-17 LAB — NO CULTURE INDICATED

## 2022-12-18 ENCOUNTER — Telehealth: Payer: Self-pay | Admitting: *Deleted

## 2022-12-18 LAB — PRO B NATRIURETIC PEPTIDE: NT-Pro BNP: 58 pg/mL (ref 0–301)

## 2022-12-18 NOTE — Progress Notes (Signed)
  Care Coordination   Note   12/18/2022 Name: Rose Miller MRN: [id number] DOB: [birth year]  Rose Miller is a 67 y.o. year old female who sees Garnette Gunner, MD for primary care. I reached out to [name] by phone today to offer care coordination services.  [name] was given information about Care Coordination services today including:   The Care Coordination services include support from the care team which includes your Nurse Coordinator, Clinical Social Worker, or Pharmacist.  The Care Coordination team is here to help remove barriers to the health concerns and goals most important to you. Care Coordination services are voluntary, and the patient may decline or stop services at any time by request to their care team member.   Care Coordination Consent Status: Patient agreed to services and verbal consent obtained.   Follow up plan:  Telephone appointment with care coordination team member scheduled for:  12/23/2022  Encounter Outcome:  Patient Scheduled from referral   Burman Nieves, [facility] Care Coordination Care Guide Direct Dial: [phone]

## 2022-12-19 ENCOUNTER — Other Ambulatory Visit: Payer: Self-pay

## 2022-12-20 ENCOUNTER — Encounter (INDEPENDENT_AMBULATORY_CARE_PROVIDER_SITE_OTHER): Payer: Self-pay | Admitting: Otolaryngology

## 2022-12-20 LAB — VITAMIN D 1,25 DIHYDROXY
Vitamin D 1, 25 (OH)2 Total: 25 pg/mL (ref 18–72)
Vitamin D2 1, 25 (OH)2: <8 pg/mL
Vitamin D3 1, 25 (OH)2: 25 pg/mL

## 2022-12-20 NOTE — Addendum Note (Signed)
Addended by: [name] B on: 12/20/2022 12:42 PM   Modules accepted: Orders

## 2022-12-23 ENCOUNTER — Ambulatory Visit: Payer: Self-pay

## 2022-12-23 NOTE — Patient Outreach (Signed)
  Care Coordination   Initial Visit Note   12/23/2022 Name: Rose Miller MRN: [id number] DOB: [birth year]  Rose Miller is a 67 y.o. year old female who sees Garnette Gunner, MD for primary care. I spoke with  [name] by phone today.  What matters to the patients health and wellness today?  Maintain health    Goals Addressed             This Visit's Progress    Maintain Health-DM       Patient Goals/Self Care Activities: -Patient/Caregiver will take medications as prescribed   -Patient/Caregiver will attend all scheduled provider appointments -Patient/Caregiver will call provider office for new concerns or questions   -check blood sugar at prescribed times -check blood sugar if I feel it is too high or too low -record values and write them down take them to all doctor visits  -check feet daily for cuts, sores or redness -trim toenails straight across -wash and dry feet carefully every day   Spoke with patient.  She reports she is doing okay but having some shortness of breath in the shower. Dr. Janee Morn referred her to pulmonary.  Discussed pulmonary referral.  Patient A1c 5.9.  Blood sugar checks 100-120.  Discussed diabetes management. She verbalized understanding. Patient working with pharmacy for patient assistance.  No RN CM concerns.            SDOH assessments and interventions completed:  Yes  SDOH Interventions Today    Flowsheet Row Most Recent Value  SDOH Interventions   Food Insecurity Interventions Intervention Not Indicated  Transportation Interventions Intervention Not Indicated  Utilities Interventions Intervention Not Indicated  Health Literacy Interventions Intervention Not Indicated        Care Coordination Interventions:  Yes, provided   Follow up plan: Follow up call scheduled for February    Encounter Outcome:  Patient Visit Completed   Bary Leriche, RN, MSN RN Care Manager [facility],  Population Health Direct Dial: [phone]  Fax: [phone] Website: [email or web address]

## 2022-12-23 NOTE — Patient Instructions (Signed)
Visit Information  Thank you for taking time to visit with me today. Please don't hesitate to contact me if I can be of assistance to you.   Following are the goals we discussed today:   Goals Addressed             This Visit's Progress    Maintain Health-DM       Patient Goals/Self Care Activities: -Patient/Caregiver will take medications as prescribed   -Patient/Caregiver will attend all scheduled provider appointments -Patient/Caregiver will call provider office for new concerns or questions   -check blood sugar at prescribed times -check blood sugar if I feel it is too high or too low -record values and write them down take them to all doctor visits  -check feet daily for cuts, sores or redness -trim toenails straight across -wash and dry feet carefully every day   Spoke with patient.  She reports she is doing okay but having some shortness of breath in the shower. Dr. Janee Morn referred her to pulmonary.  Discussed pulmonary referral.  Patient A1c 5.9.  Blood sugar checks 100-120.  Discussed diabetes management. She verbalized understanding. Patient working with pharmacy for patient assistance.  No RN CM concerns.            Our next appointment is by telephone on 02/24/23 at 130 pm  Please call the care guide team at [phone] if you need to cancel or reschedule your appointment.   If you are experiencing a Mental Health or Behavioral Health Crisis or need someone to talk to, please call the Suicide and Crisis Lifeline: 988   Patient verbalizes understanding of instructions and care plan provided today and agrees to view in MyChart. Active MyChart status and patient understanding of how to access instructions and care plan via MyChart confirmed with patient.     The patient has been provided with contact information for the care management team and has been advised to call with any health related questions or concerns.   Bary Leriche, RN, MSN RN Care Manager [facility], Population Health Direct Dial: [phone]  Fax: [phone] Website: [email or web address]

## 2022-12-24 ENCOUNTER — Other Ambulatory Visit: Payer: Self-pay

## 2022-12-25 ENCOUNTER — Other Ambulatory Visit: Payer: Self-pay

## 2022-12-25 NOTE — Progress Notes (Signed)
   12/25/2022  Patient ID: Rose Miller, female   DOB: 08-May-1955, 67 y.o.   MRN: [id number]  S/O Telephone visit to follow up on control of diabetes and assist with PAP re-enrollments for 2025  Diabetes -Current medications:  Rybelsus 14mg  daily, Jardiance 10mg  daily -A1c at PCP visit this month 5.9% -Patient provided most recent FBG values of:  105, 113, 118, 117 -Patient is tolerating current medication regiment well with no reported adverse side effects -She receives Rybelsus through [facility] PAP; enrollment is good through 04/10/2023 -She received Jardiance through [facility] PAP; enrollment is good through 01/14/2023.  She has just received a 90 day refill and is currently working on 2025 re-enrollment application.  Medication access/adherence -Patient also receives Eliquis through [facility] P[facility], and this enrollment is good through 01/14/2023.  She currently has a 5 month supply of medication on hand, and she will not qualify for 2025 re-enrollment until 3% of income is spent out of pocket on medication costs in 2025.  We are holding off on renewal application at this time based on current supply and fact she will not have met eligibility requirements yet.  A/P  Diabetes -Well controlled -Continue current regimen, regular monitoring/recording of BG, and regular follow-up with PCP and PharmD -I will work on [facility] P[facility] 2025 re-enrollment for Rybelsus in February -Patient will complete her portion of Jardiance PAP application, bring to Dr. Ronnell Freshwater office for provider portion completion and faxing into program  Medication access/adherence -Will continue regular follow-up with patient to determine need/eligibility for Eliquis PAP in 2025  Follow-up:  2/21  [name], PharmD, DPLA

## 2022-12-26 NOTE — Progress Notes (Signed)
[facility]   MULTIDISCIPLINARY HEART VALVE CLINIC                                     Cardiology Office Note:    Date:  12/27/2022   ID:  Rose Miller, DOB 1955/09/18, MRN [id number]  PCP:  Garnette Gunner, MD  [facility] Cardiologist:  Lesleigh Noe, MD (Inactive) Will establish care with Dr. Townsend [facility] Electrophysiologist:  None   Referring MD: Garnette Gunner, MD   Follow up  History of Present Illness:    Rose Miller is a 67 y.o. 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    She was previously followed by Dr. Katrinka Blazing and then Dr. Shari Prows for her cardiology care. With Dr. Michaelle Copas retirement and Dr. Devin Going relocation she needs to establish with a new cardiologist. I made her an apt with Dr. Duke Salvia in March 2025.  The patient was noted to have mixed aortic valve disease by echo 11/15/20 which showed EF 70% and moderate to severe aortic stenosis and severe aortic insufficiency. Cardiac catheterization in 11/2020 demonstrated mild obstructive coronary artery disease. S/p TAVR with a 26 mm Edwards Sapien 3 Ultra Resilia THV via the TF approach on 12/26/20. Post operative echo showed EF 70%, normally functioning TAVR with a mean gradient of 17.7 mmHg and no PVL. Given marked sinus bradycardia after TAVR home Toprol XL was discontinued. She was discharged on a baby aspirin. She then presented to the ER on 12/30/20 and was found to be in rapid afib and started on Eliquis and PRN diltiazem. At follow up Toprol XL was resumed. 1 month echo showed EF 60%, normally functioning TAVR with a mean gradient of 11 mm hg and trivial PVL. She has done well in follow up with no further admissions.   Today the patient presents to clinic for follow up. Here alone. No CP but does have some SOB potentially related  to weight. No LE edema, orthopnea or PND. No dizziness or syncope. No blood in stool or urine. No palpitations. Wants to lose weight but hasn't had success on Ozempic or Rebylsis.    Past Medical History:  Diagnosis Date   Adhesive capsulitis    Aortic valve disease    Arthritis    Asthma    Back pain    CKD (chronic kidney disease) stage 3, GFR 30-59 ml/min (HCC)    Constipation    COPD (chronic obstructive pulmonary disease) (HCC)    Diabetes mellitus without complication (HCC)    Diuretic-induced hypokalemia    Hypercholesterolemia    Hypertension    IT band syndrome    Morbid obesity (HCC)    Palpitations    Periumbilical pain    Postmenopausal estrogen deficiency    Prediabetes    S/P TAVR (transcatheter aortic valve replacement) 12/26/2020   Ventral hernia      Current Medications: Current Meds  Medication Sig   amLODipine (NORVASC) 10 MG tablet Take 1 tablet (10 mg total) by mouth daily.   amoxicillin (AMOXIL) 500 MG tablet TAKE 4 TABLETS 1 HOUR PRIORTO DENTAL PROCEDURES AND   CLEANINGS   apixaban (ELIQUIS) 5 MG TABS tablet Take 1 tablet (5 mg total) by mouth 2 (two) times daily.  Ascorbic Acid (VITAMIN C) 1000 MG tablet Take 1,000 mg by mouth daily.   atorvastatin (LIPITOR) 10 MG tablet Take 1 tablet (10 mg total) by mouth daily.   Blood Glucose Monitoring Suppl (ONE TOUCH ULTRA 2) w/Device KIT Use as directed in the morning, at noon, and at bedtime.   Cholecalciferol 50 MCG (2000 UT) TABS Take 2,000 Units by mouth daily.   Coenzyme Q10 (CO Q-10) 100 MG CAPS Take 100 mg by mouth daily.    Cyanocobalamin (B-12) 500 MCG TABS Take 500 mcg by mouth daily.   diltiazem (CARDIZEM) 30 MG tablet Take 1 tablet (30 mg total) by mouth every 6 (six) hours as needed (fast heart rate, heart palpitations).   empagliflozin (JARDIANCE) 10 MG TABS tablet Take 1 tablet (10 mg total) by mouth daily before breakfast.   fluticasone (FLONASE) 50 MCG/ACT nasal spray Place 2 sprays into both  nostrils daily.   furosemide (LASIX) 20 MG tablet Take 1 tablet (20 mg total) by mouth daily as needed (for lower extremity swelling).   hydrALAZINE (APRESOLINE) 50 MG tablet Take 1 tablet (50 mg total) by mouth 3 (three) times daily.   ipratropium (ATROVENT) 0.06 % nasal spray Place 2 sprays into both nostrils 4 (four) times daily.   losartan (COZAAR) 100 MG tablet Take 1 tablet (100 mg total) by mouth daily. Discontinue losartan/hctz   Menthol, Topical Analgesic, (BLUE-EMU MAXIMUM STRENGTH) 2.5 % LIQD Apply 1 application topically daily as needed (pain).   metoprolol succinate (TOPROL-XL) 25 MG 24 hr tablet Take 1 tablet (25 mg total) by mouth daily.   montelukast (SINGULAIR) 10 MG tablet TAKE 1 TABLET BY MOUTH AT BEDTIME   mupirocin ointment (BACTROBAN) 2 % Apply 1 Application topically 2 (two) times daily for 14 days   Olopatadine HCl 0.2 % SOLN Apply 1 drop to eye in the morning and at bedtime.   omeprazole (PRILOSEC) 20 MG capsule Take 1 capsule (20 mg total) by mouth daily.   Semaglutide (RYBELSUS) 14 MG TABS Take 1 tablet (14 mg total) by mouth daily.   spironolactone (ALDACTONE) 25 MG tablet Take 1 tablet (25 mg total) by mouth daily.   Zinc 50 MG TABS Take 50 mg by mouth daily.      ROS:   Please see the history of present illness.    All other systems reviewed and are negative.  EKGs       Risk Assessment/Calculations:    CHA2DS2-VASc Score = 3   This indicates a 3.2% annual risk of stroke. The patient's score is based upon: CHF History: 0 HTN History: 1 Diabetes History: 0 Stroke History: 0 Vascular Disease History: 0 Age Score: 1 Gender Score: 1           Physical Exam:    VS:  BP 134/76   Pulse 69   Ht 5\' 10"  (1.778 m)   Wt (!) 361 lb (163.7 kg)   SpO2 98%   BMI 51.80 kg/m     Wt Readings from Last 3 Encounters:  12/27/22 (!) 361 lb (163.7 kg)  12/16/22 (!) 361 lb (163.7 kg)  09/12/22 (!) 361 lb (163.7 kg)     GEN: Well nourished, well  developed in no acute distress, obese NECK: No JVD CARDIAC: RRR, soft flow murmur @ RUSB. No rubs, gallops RESPIRATORY:  Clear to auscultation without rales, wheezing or rhonchi  ABDOMEN: Soft, non-tender, non-distended EXTREMITIES:  No edema; No deformity.    ASSESSMENT:    1. S/P TAVR (transcatheter aortic  valve replacement)   2. Essential hypertension   3. Morbid obesity (HCC)   4. PAF (paroxysmal atrial fibrillation) (HCC)   5. Coronary artery disease due to calcified coronary lesion     PLAN:    In order of problems listed above  Severe AS/AI s/p TAVR: echo today shows EF 60%, mod LVH, s/p TAVR with a mean gradient of 23 mm hg and no PVL.Gradients 17.7 mmhg on POD1 echo and 12 mmhg on 30 day echo. She is already anticoagulated with Eliquis. Will get an echo set up in 3 months to follow gradient trend. If it continues to climb, will need to get her set up for a cardiac CT.  She has amoxicillin for SBE prophylaxis. Continue on apixiban 5mg  BID. She will establish care with Dr. Duke Salvia in 03/2023   HTN: BP well controlled today. Current medications include hydralazine 50mg  TID, Norvasc 10mg  daily, Losartan 100mg  daily, Toprol XL 25mg  daily, and Spiro 25mg  daily. Recent labs on 12/2 were okay.    Morbid obesity: Body mass index is 51.89 kg/m. She failed Ozempic and Rybelsis.Will reach out to pharmacy team to see if [facility] might be covered as it has had better data for weight loss.    PAF: sounds regular on exam today. Continue on Toprol XL and PRN dilt as well as Elqiuis 5mg  BID.    CAD: mild by cath in 2022. Continue atorvastatin 10mg  daily. No aspirin given OAC.    Medication Adjustments/Labs and Tests Ordered: Current medicines are reviewed at length with the patient today.  Concerns regarding medicines are outlined above.  Orders Placed This Encounter  Procedures   ECHOCARDIOGRAM COMPLETE   No orders of the defined types were placed in this encounter.   Patient  Instructions  Medication Instructions:  Your physician recommends that you continue on your current medications as directed. Please refer to the Current Medication list given to you today.  *If you need a refill on your cardiac medications before your next appointment, please call your pharmacy*   Lab Work: None ordered   If you have labs (blood work) drawn today and your tests are completely normal, you will receive your results only by: MyChart Message (if you have MyChart) OR A paper copy in the mail If you have any lab test that is abnormal or we need to change your treatment, we will call you to review the results.   Testing/Procedures: None ordered    Follow-Up: Follow up as scheduled   Other Instructions Talk to your primary care doctor about starting [facility] for weight loss    Signed, Cline Crock, PA-C  12/27/2022 10:45 AM    [facility]

## 2022-12-27 ENCOUNTER — Ambulatory Visit (INDEPENDENT_AMBULATORY_CARE_PROVIDER_SITE_OTHER): Payer: HMO | Admitting: Physician Assistant

## 2022-12-27 ENCOUNTER — Telehealth: Payer: Self-pay

## 2022-12-27 ENCOUNTER — Telehealth: Payer: Self-pay | Admitting: Physician Assistant

## 2022-12-27 ENCOUNTER — Ambulatory Visit: Payer: HMO | Attending: Cardiology

## 2022-12-27 VITALS — BP 134/76 | HR 69 | Ht 70.0 in | Wt 361.0 lb

## 2022-12-27 DIAGNOSIS — I517 Cardiomegaly: Secondary | ICD-10-CM

## 2022-12-27 DIAGNOSIS — I2584 Coronary atherosclerosis due to calcified coronary lesion: Secondary | ICD-10-CM

## 2022-12-27 DIAGNOSIS — I080 Rheumatic disorders of both mitral and aortic valves: Secondary | ICD-10-CM | POA: Diagnosis not present

## 2022-12-27 DIAGNOSIS — I480 Paroxysmal atrial fibrillation: Secondary | ICD-10-CM | POA: Insufficient documentation

## 2022-12-27 DIAGNOSIS — I10 Essential (primary) hypertension: Secondary | ICD-10-CM | POA: Insufficient documentation

## 2022-12-27 DIAGNOSIS — I5030 Unspecified diastolic (congestive) heart failure: Secondary | ICD-10-CM

## 2022-12-27 DIAGNOSIS — I77810 Thoracic aortic ectasia: Secondary | ICD-10-CM

## 2022-12-27 DIAGNOSIS — Z952 Presence of prosthetic heart valve: Secondary | ICD-10-CM

## 2022-12-27 DIAGNOSIS — I2510 Atherosclerotic heart disease of native coronary artery without angina pectoris: Secondary | ICD-10-CM | POA: Diagnosis not present

## 2022-12-27 LAB — ECHOCARDIOGRAM COMPLETE
AR max vel: 1.49 cm2
AV Area VTI: 1.46 cm2
AV Area mean vel: 1.51 cm2
AV Mean grad: 23 mm[Hg]
AV Peak grad: 40.7 mm[Hg]
Ao pk vel: 3.19 m/s
Area-P 1/2: 2.6 cm2
S' Lateral: 2.6 cm

## 2022-12-27 NOTE — Telephone Encounter (Signed)
Attempted to call patient, no answer and unable to leave message. VM full.  No documented calls seen in chart, unsure whose call she is returning.

## 2022-12-27 NOTE — Patient Instructions (Signed)
Medication Instructions:  Your physician recommends that you continue on your current medications as directed. Please refer to the Current Medication list given to you today.  *If you need a refill on your cardiac medications before your next appointment, please call your pharmacy*   Lab Work: None ordered   If you have labs (blood work) drawn today and your tests are completely normal, you will receive your results only by: MyChart Message (if you have MyChart) OR A paper copy in the mail If you have any lab test that is abnormal or we need to change your treatment, we will call you to review the results.   Testing/Procedures: None ordered    Follow-Up: Follow up as scheduled   Other Instructions Talk to your primary care doctor about starting [facility] for weight loss

## 2022-12-27 NOTE — Telephone Encounter (Signed)
Contacted patient to advise her that her rybelsus order was delivered today and that she can come pick it up when she's available. Patient stated she will come next week. Placing medication in vaccine fridge labeled with her name and dob in a big biohazard bag.

## 2022-12-27 NOTE — Telephone Encounter (Signed)
Patient stated she is returning staff call.

## 2022-12-30 ENCOUNTER — Telehealth: Payer: Self-pay | Admitting: Pharmacist

## 2022-12-30 ENCOUNTER — Telehealth: Payer: Self-pay | Admitting: Pharmacy Technician

## 2022-12-30 ENCOUNTER — Other Ambulatory Visit (HOSPITAL_COMMUNITY): Payer: Self-pay

## 2022-12-30 NOTE — Telephone Encounter (Signed)
-----   Message from Cline Crock sent at 12/27/2022  3:16 PM EST ----- Regarding: ZOXWRU Hey! Do you know how I can find out if her insurance would cover tirzepitide. Ozempic and rebyliss did not work for her and I know Katherine Basset has better data for weight loss. She is a diabetic.  Thanks! KT

## 2022-12-30 NOTE — Telephone Encounter (Signed)
call patient to make her aware of Mounjaro cost and to see if that is affordable.  no answer and unable to leave message. VM full.

## 2022-12-30 NOTE — Telephone Encounter (Signed)
Pharmacy Patient Advocate Encounter  Received notification from [facility] [employer] that Prior Authorization for mounjaro has been APPROVED from 12/30/22 to 12/30/23. Ran test claim, Copay is $256.32- one month. This test claim was processed through [facility]- copay amounts may vary at other pharmacies due to pharmacy/plan contracts, or as the patient moves through the different stages of their insurance plan.   PA #/Case ID/Reference #: [id number]

## 2022-12-30 NOTE — Telephone Encounter (Signed)
Pharmacy Patient Advocate Encounter   Received notification from Pt Calls Messages that prior authorization for mounjaro is required/requested.   Insurance verification completed.   The patient is insured through [facility] [employer]ANT[employer]/RX ADVANCE .   Per test claim: PA required; PA submitted to above mentioned insurance via CoverMyMeds Key/confirmation #/EOC [id number] Status is pending

## 2022-12-31 NOTE — Telephone Encounter (Signed)
Attempted to reach patient again to follow-up on her call (states she's returning a call) and no answer and message states "Hello, we are not available now. Please call again. Thank you" and disconnects. No notation in chart of call. Will close encounter.

## 2023-01-03 ENCOUNTER — Ambulatory Visit: Payer: HMO

## 2023-01-03 ENCOUNTER — Other Ambulatory Visit (HOSPITAL_COMMUNITY): Payer: HMO

## 2023-01-06 ENCOUNTER — Other Ambulatory Visit: Payer: Self-pay

## 2023-01-10 ENCOUNTER — Ambulatory Visit (INDEPENDENT_AMBULATORY_CARE_PROVIDER_SITE_OTHER): Payer: HMO | Admitting: Family

## 2023-01-10 ENCOUNTER — Encounter: Payer: Self-pay | Admitting: Family

## 2023-01-10 VITALS — BP 128/86 | HR 86 | Temp 97.9°F | Wt 354.2 lb

## 2023-01-10 DIAGNOSIS — J019 Acute sinusitis, unspecified: Secondary | ICD-10-CM | POA: Diagnosis not present

## 2023-01-10 DIAGNOSIS — B9689 Other specified bacterial agents as the cause of diseases classified elsewhere: Secondary | ICD-10-CM

## 2023-01-10 DIAGNOSIS — R0981 Nasal congestion: Secondary | ICD-10-CM | POA: Diagnosis not present

## 2023-01-10 LAB — POCT INFLUENZA A/B
Influenza A, POC: NEGATIVE
Influenza B, POC: NEGATIVE

## 2023-01-10 LAB — POC COVID19 BINAXNOW: SARS Coronavirus 2 Ag: NEGATIVE

## 2023-01-10 MED ORDER — AMOXICILLIN 500 MG PO CAPS: 500.0000 mg | ORAL_CAPSULE | Freq: Three times a day (TID) | ORAL | 0 refills | Status: AC

## 2023-01-10 MED ORDER — FLUTICASONE PROPIONATE 50 MCG/ACT NA SUSP
2.0000 | Freq: Every day | NASAL | 6 refills | Status: DC
  Filled 2023-08-22: qty 16, 30d supply, fill #0
  Filled 2023-09-20: qty 16, 30d supply, fill #1

## 2023-01-10 NOTE — Telephone Encounter (Signed)
Medication provided to patient at her OV today.

## 2023-01-10 NOTE — Progress Notes (Signed)
Acute Office Visit  Subjective:     Patient ID: Rose Miller, female    DOB: July 09, 1955, 67 y.o.   MRN: [id number]  Chief Complaint  Patient presents with  . Ear Pain    Ear pain , headaches, chills x 1.5 weeks.     HPI Patient is in today with congestion, headache, chills, ear pain x 1-1/2 weeks.  Has not been taking any medication over-the-counter symptoms are not improving.  No known sick contacts.  Has a history of aortic valve replacement.  Review of Systems  Constitutional:  Positive for chills.  HENT:  Positive for congestion, ear pain, sinus pain and sore throat.   Respiratory: Negative.  Negative for cough.   Cardiovascular: Negative.   Musculoskeletal: Negative.   Skin: Negative.   Neurological: Negative.   Psychiatric/Behavioral: Negative.    Past Medical History:  Diagnosis Date  . Adhesive capsulitis   . Aortic valve disease   . Arthritis   . Asthma   . Back pain   . CKD (chronic kidney disease) stage 3, GFR 30-59 ml/min (HCC)   . Constipation   . COPD (chronic obstructive pulmonary disease) (HCC)   . Diabetes mellitus without complication (HCC)   . Diuretic-induced hypokalemia   . Hypercholesterolemia   . Hypertension   . IT band syndrome   . Morbid obesity (HCC)   . Palpitations   . Periumbilical pain   . Postmenopausal estrogen deficiency   . Prediabetes   . S/P TAVR (transcatheter aortic valve replacement) 12/26/2020  . Ventral hernia     Social History   Socioeconomic History  . Marital status: Single    Spouse name: Not on file  . Number of children: 1  . Years of education: Not on file  . Highest education level: Not on file  Occupational History  . Occupation: retired  Tobacco Use  . Smoking status: Former    Current packs/day: 0.00    Average packs/day: 1 pack/day for 38.0 years (38.0 ttl pk-yrs)    Types: Cigarettes    Start date: 27    Quit date: 2013    Years since quitting: 11.9    Passive exposure: Never  .  Smokeless tobacco: Never  Vaping Use  . Vaping status: Never Used  Substance and Sexual Activity  . Alcohol use: Yes    Comment: socially  . Drug use: No  . Sexual activity: Not on file  Other Topics Concern  . Not on file  Social History Narrative  . Not on file   Social Drivers of Health   Financial Resource Strain: Low Risk  (12/13/2022)   Overall Financial Resource Strain (CARDIA)   . Difficulty of Paying Living Expenses: Not very hard  Food Insecurity: No Food Insecurity (12/23/2022)   Hunger Vital Sign   . Worried About Programme researcher, broadcasting/film/video in the Last Year: Never true   . Ran Out of Food in the Last Year: Never true  Transportation Needs: No Transportation Needs (12/23/2022)   PRAPARE - Transportation   . Lack of Transportation (Medical): No   . Lack of Transportation (Non-Medical): No  Physical Activity: Inactive (12/13/2022)   Exercise Vital Sign   . Days of Exercise per Week: 0 days   . Minutes of Exercise per Session: 0 min  Stress: No Stress Concern Present (12/13/2022)   [employer] of Occupational Health - Occupational Stress Questionnaire   . Feeling of Stress : Only a little  Social Connections: Unknown (12/13/2022)  Social Connection and Isolation Panel [NHANES]   . Frequency of Communication with Friends and Family: More than three times a week   . Frequency of Social Gatherings with Friends and Family: Once a week   . Attends Religious Services: Never   . Active Member of Clubs or Organizations: No   . Attends Banker Meetings: Never   . Marital Status: Patient declined  Intimate Partner Violence: Not At Risk (12/23/2022)   Humiliation, Afraid, Rape, and Kick questionnaire   . Fear of Current or Ex-Partner: No   . Emotionally Abused: No   . Physically Abused: No   . Sexually Abused: No    Past Surgical History:  Procedure Laterality Date  . BIOPSY  05/01/2021   Procedure: BIOPSY;  Surgeon: Lynann Bologna, MD;  Location: [facility];  Service: Gastroenterology;;  . CESAREAN SECTION  1989  . COLONOSCOPY    . COLONOSCOPY WITH PROPOFOL N/A 05/01/2021   Procedure: COLONOSCOPY WITH PROPOFOL;  Surgeon: Lynann Bologna, MD;  Location: [facility];  Service: Gastroenterology;  Laterality: N/A;  . ESOPHAGOGASTRODUODENOSCOPY (EGD) WITH PROPOFOL N/A 05/01/2021   Procedure: ESOPHAGOGASTRODUODENOSCOPY (EGD) WITH PROPOFOL;  Surgeon: Lynann Bologna, MD;  Location: [facility];  Service: Gastroenterology;  Laterality: N/A;  . INTRAOPERATIVE TRANSTHORACIC ECHOCARDIOGRAM N/A 12/26/2020   Procedure: INTRAOPERATIVE TRANSTHORACIC ECHOCARDIOGRAM;  Surgeon: Orbie Pyo, MD;  Location: [facility];  Service: Open Heart Surgery;  Laterality: N/A;  Elease Hashimoto DILATION  05/01/2021   Procedure: Elease Hashimoto DILATION;  Surgeon: Lynann Bologna, MD;  Location: [facility];  Service: Gastroenterology;;  . POLYPECTOMY  05/01/2021   Procedure: POLYPECTOMY;  Surgeon: Lynann Bologna, MD;  Location: [facility];  Service: Gastroenterology;;  . RIGHT/LEFT HEART CATH AND CORONARY ANGIOGRAPHY N/A 11/24/2020   Procedure: RIGHT/LEFT HEART CATH AND CORONARY ANGIOGRAPHY;  Surgeon: Lyn Records, MD;  Location: [facility] INVASIVE CV LAB;  Service: Cardiovascular;  Laterality: N/A;  . TONSILLECTOMY AND ADENOIDECTOMY  1960  . TRANSCATHETER AORTIC VALVE REPLACEMENT, TRANSFEMORAL N/A 12/26/2020   Procedure: TRANSCATHETER AORTIC VALVE REPLACEMENT, TRANSFEMORAL;  Surgeon: Orbie Pyo, MD;  Location: [facility];  Service: Open Heart Surgery;  Laterality: N/A;    Family History  Problem Relation Age of Onset  . Dementia Mother   . Arthritis Mother   . Cancer Mother        pancreatic  . Hypertension Mother   . Obesity Mother   . Cancer Father        prostate and colon  . Colon cancer Father   . Heart disease Father   . Thyroid disease Father   . Breast cancer Neg Hx     No Known Allergies  Current Outpatient Medications on File Prior to Visit   Medication Sig Dispense Refill  . amLODipine (NORVASC) 10 MG tablet Take 1 tablet (10 mg total) by mouth daily. 90 tablet 3  . amoxicillin (AMOXIL) 500 MG tablet TAKE 4 TABLETS 1 HOUR PRIORTO DENTAL PROCEDURES AND   CLEANINGS 12 tablet 6  . apixaban (ELIQUIS) 5 MG TABS tablet Take 1 tablet (5 mg total) by mouth 2 (two) times daily. 180 tablet 1  . Ascorbic Acid (VITAMIN C) 1000 MG tablet Take 1,000 mg by mouth daily.    [name] Kitchen atorvastatin (LIPITOR) 10 MG tablet Take 1 tablet (10 mg total) by mouth daily. 90 tablet 3  . Blood Glucose Monitoring Suppl (ONE TOUCH ULTRA 2) w/Device KIT Use as directed in the morning, at noon, and at bedtime. 1 kit 0  .  Cholecalciferol 50 MCG (2000 UT) TABS Take 2,000 Units by mouth daily.    . Coenzyme Q10 (CO Q-10) 100 MG CAPS Take 100 mg by mouth daily.     . Cyanocobalamin (B-12) 500 MCG TABS Take 500 mcg by mouth daily.    [name] Kitchen diltiazem (CARDIZEM) 30 MG tablet Take 1 tablet (30 mg total) by mouth every 6 (six) hours as needed (fast heart rate, heart palpitations). 30 tablet 6  . empagliflozin (JARDIANCE) 10 MG TABS tablet Take 1 tablet (10 mg total) by mouth daily before breakfast. 90 tablet 0  . furosemide (LASIX) 20 MG tablet Take 1 tablet (20 mg total) by mouth daily as needed (for lower extremity swelling). 30 tablet 4  . hydrALAZINE (APRESOLINE) 50 MG tablet Take 1 tablet (50 mg total) by mouth 3 (three) times daily. 270 tablet 3  . ipratropium (ATROVENT) 0.06 % nasal spray Place 2 sprays into both nostrils 4 (four) times daily. 15 mL 12  . losartan (COZAAR) 100 MG tablet Take 1 tablet (100 mg total) by mouth daily. Discontinue losartan/hctz 90 tablet 3  . Menthol, Topical Analgesic, (BLUE-EMU MAXIMUM STRENGTH) 2.5 % LIQD Apply 1 application topically daily as needed (pain).    . metoprolol succinate (TOPROL-XL) 25 MG 24 hr tablet Take 1 tablet (25 mg total) by mouth daily. 90 tablet 3  . montelukast (SINGULAIR) 10 MG tablet TAKE 1 TABLET BY MOUTH AT BEDTIME 30  tablet 0  . mupirocin ointment (BACTROBAN) 2 % Apply 1 Application topically 2 (two) times daily for 14 days 22 g 3  . Olopatadine HCl 0.2 % SOLN Apply 1 drop to eye in the morning and at bedtime. 2.5 mL 3  . omeprazole (PRILOSEC) 20 MG capsule Take 1 capsule (20 mg total) by mouth daily. 30 capsule 11  . Semaglutide (RYBELSUS) 14 MG TABS Take 1 tablet (14 mg total) by mouth daily. 90 tablet 3  . spironolactone (ALDACTONE) 25 MG tablet Take 1 tablet (25 mg total) by mouth daily. 90 tablet 2  . Zinc 50 MG TABS Take 50 mg by mouth daily.     No current facility-administered medications on file prior to visit.    BP 128/86   Pulse 86   Temp 97.9 F (36.6 C) (Temporal)   Wt (!) 354 lb 3.2 oz (160.7 kg)   SpO2 97%   BMI 50.82 kg/m chart      Objective:    BP 128/86   Pulse 86   Temp 97.9 F (36.6 C) (Temporal)   Wt (!) 354 lb 3.2 oz (160.7 kg)   SpO2 97%   BMI 50.82 kg/m    Physical Exam Vitals and nursing note reviewed.  Constitutional:      Appearance: Normal appearance.  HENT:     Right Ear: Tympanic membrane, ear canal and external ear normal.     Left Ear: Tympanic membrane, ear canal and external ear normal.     Nose: Congestion and rhinorrhea present.     Mouth/Throat:     Pharynx: Posterior oropharyngeal erythema present. No oropharyngeal exudate.  Cardiovascular:     Rate and Rhythm: Normal rate and regular rhythm.  Pulmonary:     Effort: Pulmonary effort is normal.     Breath sounds: Normal breath sounds.  Musculoskeletal:        General: Normal range of motion.     Cervical back: Normal range of motion.  Skin:    General: Skin is warm and dry.  Neurological:  General: No focal deficit present.     Mental Status: She is alert and oriented to person, place, and time. Mental status is at baseline.  Psychiatric:        Mood and Affect: Mood normal.        Behavior: Behavior normal.   No results found for any visits on 01/10/23.      Assessment &  Plan:   Problem List Items Addressed This Visit   None Visit Diagnoses       Acute bacterial sinusitis    -  Primary   Relevant Medications   amoxicillin (AMOXIL) 500 MG capsule   fluticasone (FLONASE) 50 MCG/ACT nasal spray     Head congestion       Relevant Orders   POC COVID-19 BinaxNow   POCT Influenza A/B       Meds ordered this encounter  Medications  . amoxicillin (AMOXIL) 500 MG capsule    Sig: Take 1 capsule (500 mg total) by mouth 3 (three) times daily for 10 days.    Dispense:  30 capsule    Refill:  0  . fluticasone (FLONASE) 50 MCG/ACT nasal spray    Sig: Place 2 sprays into both nostrils daily.    Dispense:  16 g    Refill:  6  Call the office if symptoms worsen or persist.  Recheck as scheduled and sooner as needed.  No follow-ups on file.  Eulis Foster, FNP

## 2023-01-16 ENCOUNTER — Other Ambulatory Visit: Payer: Self-pay | Admitting: Cardiovascular Disease

## 2023-01-16 ENCOUNTER — Other Ambulatory Visit (HOSPITAL_COMMUNITY): Payer: Self-pay

## 2023-01-16 DIAGNOSIS — I351 Nonrheumatic aortic (valve) insufficiency: Secondary | ICD-10-CM

## 2023-01-16 DIAGNOSIS — Z79899 Other long term (current) drug therapy: Secondary | ICD-10-CM

## 2023-01-16 DIAGNOSIS — I350 Nonrheumatic aortic (valve) stenosis: Secondary | ICD-10-CM

## 2023-01-16 DIAGNOSIS — Z7901 Long term (current) use of anticoagulants: Secondary | ICD-10-CM

## 2023-01-16 DIAGNOSIS — Z952 Presence of prosthetic heart valve: Secondary | ICD-10-CM

## 2023-01-16 DIAGNOSIS — E782 Mixed hyperlipidemia: Secondary | ICD-10-CM

## 2023-01-16 DIAGNOSIS — I10 Essential (primary) hypertension: Secondary | ICD-10-CM

## 2023-01-16 MED ORDER — SPIRONOLACTONE 25 MG PO TABS
25.0000 mg | ORAL_TABLET | Freq: Every day | ORAL | 2 refills | Status: DC
  Filled 2023-01-16: qty 90, 90d supply, fill #0
  Filled 2023-04-12: qty 90, 90d supply, fill #1
  Filled 2023-07-11: qty 90, 90d supply, fill #2

## 2023-01-17 ENCOUNTER — Other Ambulatory Visit (HOSPITAL_COMMUNITY): Payer: Self-pay

## 2023-01-22 DIAGNOSIS — I739 Peripheral vascular disease, unspecified: Secondary | ICD-10-CM | POA: Diagnosis not present

## 2023-01-22 DIAGNOSIS — M792 Neuralgia and neuritis, unspecified: Secondary | ICD-10-CM | POA: Diagnosis not present

## 2023-01-22 DIAGNOSIS — B351 Tinea unguium: Secondary | ICD-10-CM | POA: Diagnosis not present

## 2023-01-22 DIAGNOSIS — M2041 Other hammer toe(s) (acquired), right foot: Secondary | ICD-10-CM | POA: Diagnosis not present

## 2023-01-22 DIAGNOSIS — E1142 Type 2 diabetes mellitus with diabetic polyneuropathy: Secondary | ICD-10-CM | POA: Diagnosis not present

## 2023-01-22 DIAGNOSIS — R600 Localized edema: Secondary | ICD-10-CM | POA: Diagnosis not present

## 2023-01-23 ENCOUNTER — Ambulatory Visit: Payer: HMO | Admitting: Family Medicine

## 2023-01-24 ENCOUNTER — Encounter (INDEPENDENT_AMBULATORY_CARE_PROVIDER_SITE_OTHER): Payer: Self-pay

## 2023-01-24 ENCOUNTER — Ambulatory Visit (INDEPENDENT_AMBULATORY_CARE_PROVIDER_SITE_OTHER): Payer: HMO | Admitting: Physician Assistant

## 2023-01-24 ENCOUNTER — Other Ambulatory Visit: Payer: Self-pay

## 2023-01-24 VITALS — BP 155/61 | HR 78 | Ht 71.0 in | Wt 354.0 lb

## 2023-01-24 DIAGNOSIS — J309 Allergic rhinitis, unspecified: Secondary | ICD-10-CM

## 2023-01-24 DIAGNOSIS — H9202 Otalgia, left ear: Secondary | ICD-10-CM

## 2023-01-24 MED ORDER — AZELASTINE HCL 0.1 % NA SOLN
2.0000 | Freq: Two times a day (BID) | NASAL | 12 refills | Status: DC
  Filled 2023-01-24: qty 30, 50d supply, fill #0
  Filled 2023-03-14: qty 30, 50d supply, fill #1
  Filled 2023-05-03: qty 30, 50d supply, fill #2
  Filled 2023-06-22: qty 30, 50d supply, fill #3
  Filled 2023-08-08: qty 30, 50d supply, fill #4
  Filled 2023-09-28: qty 30, 50d supply, fill #5
  Filled 2023-11-15: qty 30, 50d supply, fill #6
  Filled 2024-01-02: qty 30, 50d supply, fill #7

## 2023-01-24 NOTE — Progress Notes (Signed)
 Dear Dr. Sebastian, Here is my assessment for our mutual patient, Rose Miller. Thank you for allowing me the opportunity to care for your patient. Please do not hesitate to contact me should you have any other questions. Sincerely, Chyrl Cohen PA-C  Otolaryngology Clinic Note Referring provider: Dr. Sebastian HPI:  Rose Miller is a 68 y.o. female kindly referred by Dr. Sebastian   The patient reports that she has been having left ear pain.  She reports it started years ago, it is most days, she describes this as a dull ache.  Sometimes the characteristics of her complaint change from achy, to clogged, to pulsations.  She notes occasional clicking and popping.  She denies any change to her hearing.  No preceding trauma.  She does report some ringing in the bilateral ears, this is nonspecific and is not constant.  She denied any allergies, later on she noted that she was on Flonase  after discussing the medication.  She reports that this was prescribed by her allergist.  Chart review shows she has had allergy  testing and was being treated for chronic rhinitis.  She notes she continues to have nasal congestion.  She reports a recent episode of sinusitis, this was treated with amoxicillin .  No changes to smell or taste.  No history of recurrent ear infections.  No dizziness.    H&N Surgery: no Personal or FHx of bleeding dz or anesthesia difficulty: no   GLP-1: no AP/AC: no   Independent Review of Additional Tests or Records:  Office visit note form Allergist on 10/27/023-rhinitis, allergic conjunctivitis, history of asthma.  Allergy  testing on 10/09/2021 positive to oak tree, molds.   PMH/Meds/All/SocHx/FamHx/ROS:   Past Medical History:  Diagnosis Date   Adhesive capsulitis    Aortic valve disease    Arthritis    Asthma    Back pain    CKD (chronic kidney disease) stage 3, GFR 30-59 ml/min (HCC)    Constipation    COPD (chronic obstructive pulmonary disease) (HCC)    Diabetes  mellitus without complication (HCC)    Diuretic-induced hypokalemia    Hypercholesterolemia    Hypertension    IT band syndrome    Morbid obesity (HCC)    Palpitations    Periumbilical pain    Postmenopausal estrogen deficiency    Prediabetes    S/P TAVR (transcatheter aortic valve replacement) 12/26/2020   Ventral hernia      Past Surgical History:  Procedure Laterality Date   BIOPSY  05/01/2021   Procedure: BIOPSY;  Surgeon: Charlanne Groom, MD;  Location: [facility];  Service: Gastroenterology;;   CESAREAN SECTION  1989   COLONOSCOPY     COLONOSCOPY WITH PROPOFOL  N/A 05/01/2021   Procedure: COLONOSCOPY WITH PROPOFOL ;  Surgeon: Charlanne Groom, MD;  Location: [facility];  Service: Gastroenterology;  Laterality: N/A;   ESOPHAGOGASTRODUODENOSCOPY (EGD) WITH PROPOFOL  N/A 05/01/2021   Procedure: ESOPHAGOGASTRODUODENOSCOPY (EGD) WITH PROPOFOL ;  Surgeon: Charlanne Groom, MD;  Location: [facility];  Service: Gastroenterology;  Laterality: N/A;   INTRAOPERATIVE TRANSTHORACIC ECHOCARDIOGRAM N/A 12/26/2020   Procedure: INTRAOPERATIVE TRANSTHORACIC ECHOCARDIOGRAM;  Surgeon: Wendel Lurena POUR, MD;  Location: [facility];  Service: Open Heart Surgery;  Laterality: N/A;   MALONEY DILATION  05/01/2021   Procedure: AGAPITO DILATION;  Surgeon: Charlanne Groom, MD;  Location: [facility];  Service: Gastroenterology;;   POLYPECTOMY  05/01/2021   Procedure: POLYPECTOMY;  Surgeon: Charlanne Groom, MD;  Location: [facility];  Service: Gastroenterology;;   RIGHT/LEFT HEART CATH AND CORONARY ANGIOGRAPHY N/A 11/24/2020  Procedure: RIGHT/LEFT HEART CATH AND CORONARY ANGIOGRAPHY;  Surgeon: Claudene Victory ORN, MD;  Location: [facility] INVASIVE CV LAB;  Service: Cardiovascular;  Laterality: N/A;   TONSILLECTOMY AND ADENOIDECTOMY  1960   TRANSCATHETER AORTIC VALVE REPLACEMENT, TRANSFEMORAL N/A 12/26/2020   Procedure: TRANSCATHETER AORTIC VALVE REPLACEMENT, TRANSFEMORAL;  Surgeon: Wendel Lurena POUR, MD;  Location: [facility]  INVASIVE CV LAB;  Service: Open Heart Surgery;  Laterality: N/A;    Family History  Problem Relation Age of Onset   Dementia Mother    Arthritis Mother    Cancer Mother        pancreatic   Hypertension Mother    Obesity Mother    Cancer Father        prostate and colon   Colon cancer Father    Heart disease Father    Thyroid  disease Father    Breast cancer Neg Hx      Social Connections: Unknown (12/13/2022)   Social Connection and Isolation Panel [NHANES]    Frequency of Communication with Friends and Family: More than three times a week    Frequency of Social Gatherings with Friends and Family: Once a week    Attends Religious Services: Never    Database Administrator or Organizations: No    Attends Banker Meetings: Never    Marital Status: Patient declined      Current Outpatient Medications:    amLODipine  (NORVASC ) 10 MG tablet, Take 1 tablet (10 mg total) by mouth daily., Disp: 90 tablet, Rfl: 3   apixaban  (ELIQUIS ) 5 MG TABS tablet, Take 1 tablet (5 mg total) by mouth 2 (two) times daily., Disp: 180 tablet, Rfl: 1   Ascorbic Acid (VITAMIN C) 1000 MG tablet, Take 1,000 mg by mouth daily., Disp: , Rfl:    atorvastatin  (LIPITOR) 10 MG tablet, Take 1 tablet (10 mg total) by mouth daily., Disp: 90 tablet, Rfl: 3   Blood Glucose Monitoring Suppl (ONE TOUCH ULTRA 2) w/Device KIT, Use as directed in the morning, at noon, and at bedtime., Disp: 1 kit, Rfl: 0   Cholecalciferol 50 MCG (2000 UT) TABS, Take 2,000 Units by mouth daily., Disp: , Rfl:    Coenzyme Q10 (CO Q-10) 100 MG CAPS, Take 100 mg by mouth daily. , Disp: , Rfl:    Cyanocobalamin (B-12) 500 MCG TABS, Take 500 mcg by mouth daily., Disp: , Rfl:    diltiazem  (CARDIZEM ) 30 MG tablet, Take 1 tablet (30 mg total) by mouth every 6 (six) hours as needed (fast heart rate, heart palpitations)., Disp: 30 tablet, Rfl: 6   empagliflozin  (JARDIANCE ) 10 MG TABS tablet, Take 1 tablet (10 mg total) by mouth daily before  breakfast., Disp: 90 tablet, Rfl: 0   fluticasone  (FLONASE ) 50 MCG/ACT nasal spray, Place 2 sprays into both nostrils daily., Disp: 16 g, Rfl: 6   furosemide  (LASIX ) 20 MG tablet, Take 1 tablet (20 mg total) by mouth daily as needed (for lower extremity swelling)., Disp: 30 tablet, Rfl: 4   hydrALAZINE  (APRESOLINE ) 50 MG tablet, Take 1 tablet (50 mg total) by mouth 3 (three) times daily., Disp: 270 tablet, Rfl: 3   ipratropium (ATROVENT ) 0.06 % nasal spray, Place 2 sprays into both nostrils 4 (four) times daily., Disp: 15 mL, Rfl: 12   losartan  (COZAAR ) 100 MG tablet, Take 1 tablet (100 mg total) by mouth daily. Discontinue losartan /hctz, Disp: 90 tablet, Rfl: 3   Menthol, Topical Analgesic, (BLUE-EMU MAXIMUM STRENGTH) 2.5 % LIQD, Apply 1 application topically daily as needed (  pain)., Disp: , Rfl:    metoprolol  succinate (TOPROL -XL) 25 MG 24 hr tablet, Take 1 tablet (25 mg total) by mouth daily., Disp: 90 tablet, Rfl: 3   montelukast  (SINGULAIR ) 10 MG tablet, TAKE 1 TABLET BY MOUTH AT BEDTIME, Disp: 30 tablet, Rfl: 0   mupirocin  ointment (BACTROBAN ) 2 %, Apply 1 Application topically 2 (two) times daily for 14 days, Disp: 22 g, Rfl: 3   Olopatadine  HCl 0.2 % SOLN, Apply 1 drop to eye in the morning and at bedtime., Disp: 2.5 mL, Rfl: 3   omeprazole  (PRILOSEC ) 20 MG capsule, Take 1 capsule (20 mg total) by mouth daily., Disp: 30 capsule, Rfl: 11   Semaglutide  (RYBELSUS ) 14 MG TABS, Take 1 tablet (14 mg total) by mouth daily., Disp: 90 tablet, Rfl: 3   spironolactone  (ALDACTONE ) 25 MG tablet, Take 1 tablet (25 mg total) by mouth daily., Disp: 90 tablet, Rfl: 2   Zinc 50 MG TABS, Take 50 mg by mouth daily., Disp: , Rfl:    amoxicillin  (AMOXIL ) 500 MG tablet, TAKE 4 TABLETS 1 HOUR PRIORTO DENTAL PROCEDURES AND   CLEANINGS (Patient not taking: Reported on 01/24/2023), Disp: 12 tablet, Rfl: 6   Physical Exam:   BP (!) 155/61 (BP Location: Right Arm, Patient Position: Sitting, Cuff Size: Normal) Comment:  bp cuff is normal size need large  Pulse 78   Ht 5' 11 (1.803 m)   Wt (!) 354 lb (160.6 kg)   SpO2 92%   BMI 49.37 kg/m   Pertinent Findings   CN II-XII intact  Bilateral EAC clear and TM intact with well pneumatized middle ear spaces Weber 512: equal Rinne 512: AC > BC b/l  Anterior rhinoscopy: Septum midline; bilateral inferior turbinates with hypertrophy, pale No lesions of oral cavity/oropharynx; dentition wnl No obviously palpable neck masses/lymphadenopathy/thyromegaly No respiratory distress or stridor  Seprately Identifiable Procedures:  None  Impression & Plans:  Rose Miller is a 68 y.o. female with the following   Left ear pain-  Uncertain etiology at this time.  Physical exam is normal.  I would recommend audiogram for further evaluation.  She also has chronic untreated rhinitis which could be leading to the discomfort she is feeling.  Allergic rhinitis-  She has uncontrolled allergic rhinitis.  She is currently taking Flonase  but no other medications for this.  I have added azelastine  and encouraged use of antihistamine as needed.  I would like her to use these medications and follow-up with me in 2 months for repeat evaluation and audiogram at that time.  Follow-up in 2 months with audiogram and repeat evaluation for allergic rhinitis.  Sooner as needed.  Patient verbalized understanding and agreement to today's plan had no further questions or concerns.   Thank you for allowing me the opportunity to care for your patient. Please do not hesitate to contact me should you have any other questions.  Sincerely, Chyrl Cohen PA-C [facility] Phone: [phone] Fax: [phone]  01/24/2023, 12:13 PM

## 2023-01-27 ENCOUNTER — Other Ambulatory Visit (HOSPITAL_COMMUNITY): Payer: Self-pay

## 2023-01-27 ENCOUNTER — Ambulatory Visit (INDEPENDENT_AMBULATORY_CARE_PROVIDER_SITE_OTHER): Payer: HMO | Admitting: Audiology

## 2023-01-28 NOTE — Telephone Encounter (Signed)
 A user error has taken place: charting done on wrong patient and has been corrected.

## 2023-01-31 ENCOUNTER — Other Ambulatory Visit (HOSPITAL_COMMUNITY): Payer: Self-pay

## 2023-02-11 DIAGNOSIS — E1142 Type 2 diabetes mellitus with diabetic polyneuropathy: Secondary | ICD-10-CM | POA: Diagnosis not present

## 2023-02-11 DIAGNOSIS — I739 Peripheral vascular disease, unspecified: Secondary | ICD-10-CM | POA: Diagnosis not present

## 2023-02-11 DIAGNOSIS — B351 Tinea unguium: Secondary | ICD-10-CM | POA: Diagnosis not present

## 2023-02-11 DIAGNOSIS — M2041 Other hammer toe(s) (acquired), right foot: Secondary | ICD-10-CM | POA: Diagnosis not present

## 2023-02-11 DIAGNOSIS — M792 Neuralgia and neuritis, unspecified: Secondary | ICD-10-CM | POA: Diagnosis not present

## 2023-02-11 DIAGNOSIS — R600 Localized edema: Secondary | ICD-10-CM | POA: Diagnosis not present

## 2023-02-17 ENCOUNTER — Other Ambulatory Visit (HOSPITAL_COMMUNITY): Payer: Self-pay

## 2023-02-17 ENCOUNTER — Ambulatory Visit: Payer: HMO | Admitting: Family Medicine

## 2023-02-20 DIAGNOSIS — R351 Nocturia: Secondary | ICD-10-CM | POA: Diagnosis not present

## 2023-02-20 DIAGNOSIS — R3121 Asymptomatic microscopic hematuria: Secondary | ICD-10-CM | POA: Diagnosis not present

## 2023-02-20 DIAGNOSIS — R350 Frequency of micturition: Secondary | ICD-10-CM | POA: Diagnosis not present

## 2023-02-21 ENCOUNTER — Ambulatory Visit
Admission: RE | Admit: 2023-02-21 | Discharge: 2023-02-21 | Disposition: A | Discharge status: Home / Self Care | Payer: HMO | Source: Ambulatory Visit | Attending: Family Medicine | Admitting: Family Medicine

## 2023-02-21 DIAGNOSIS — E2839 Other primary ovarian failure: Secondary | ICD-10-CM | POA: Diagnosis not present

## 2023-02-21 DIAGNOSIS — N958 Other specified menopausal and perimenopausal disorders: Secondary | ICD-10-CM | POA: Diagnosis not present

## 2023-02-21 DIAGNOSIS — Z780 Asymptomatic menopausal state: Secondary | ICD-10-CM

## 2023-02-22 ENCOUNTER — Encounter: Payer: Self-pay | Admitting: Family Medicine

## 2023-02-24 ENCOUNTER — Telehealth: Payer: Self-pay

## 2023-02-24 ENCOUNTER — Ambulatory Visit: Payer: Self-pay

## 2023-02-24 NOTE — Telephone Encounter (Signed)
 PAP: Patient assistance application for Jardiance through [employer]) has been mailed to pt's home address on file. Provider portion of application will be faxed to provider's office.

## 2023-02-24 NOTE — Patient Instructions (Signed)
 Visit Information  Thank you for taking time to visit with me today. Please don't hesitate to contact me if I can be of assistance to you.   Following are the goals we discussed today:   Goals Addressed             This Visit's Progress    Maintain Health-DM       Patient Goals/Self Care Activities: -Patient/Caregiver will take medications as prescribed   -Patient/Caregiver will attend all scheduled provider appointments -Patient/Caregiver will call provider office for new concerns or questions   -check blood sugar at prescribed times -check blood sugar if I feel it is too high or too low -record values and write them down take them to all doctor visits  -check feet daily for cuts, sores or redness -trim toenails straight across -wash and dry feet carefully every day   Spoke with patient.  She reports she is doing good. She reports that her sugars have done well, with readings 100-120.  Reiterated diabetes management. She verbalized understanding. No RN CM concerns.            Our next appointment is by telephone on 04/21/23 at 130 pm  Please call the care guide team at [phone] if you need to cancel or reschedule your appointment.   If you are experiencing a Mental Health or Behavioral Health Crisis or need someone to talk to, please call the Suicide and Crisis Lifeline: 988   Patient verbalizes understanding of instructions and care plan provided today and agrees to view in MyChart. Active MyChart status and patient understanding of how to access instructions and care plan via MyChart confirmed with patient.     The patient has been provided with contact information for the care management team and has been advised to call with any health related questions or concerns.   Yavier Snider J. Darla Mcdonald RN, MSN [facility], [facility] RN Care Manager Direct Dial: [phone]  Fax: [phone] Website: [email or web address]

## 2023-02-24 NOTE — Patient Outreach (Signed)
  Care Coordination   Follow Up Visit Note   02/24/2023 Name: Rose Miller MRN: [id number] DOB: Apr 11, 1955  Rose Miller is a 68 y.o. year old female who sees Rose Cluck, MD for primary care. I spoke with  Rose Miller by phone today.  What matters to the patients health and wellness today?  Maintain health    Goals Addressed             This Visit's Progress    Maintain Health-DM       Patient Goals/Self Care Activities: -Patient/Caregiver will take medications as prescribed   -Patient/Caregiver will attend all scheduled provider appointments -Patient/Caregiver will call provider office for new concerns or questions   -check blood sugar at prescribed times -check blood sugar if I feel it is too high or too low -record values and write them down take them to all doctor visits  -check feet daily for cuts, sores or redness -trim toenails straight across -wash and dry feet carefully every day   Spoke with patient.  She reports she is doing good. She reports that her sugars have done well, with readings 100-120.  Reiterated diabetes management. She verbalized understanding. No RN CM concerns.            SDOH assessments and interventions completed:  Yes     Care Coordination Interventions:  Yes, provided   Follow up plan: Follow up call scheduled for April    Encounter Outcome:  Patient Visit Completed   Phung Kotas J. Cuthbert Turton RN, MSN [facility], [facility] RN Care Manager Direct Dial: [phone]  Fax: [phone] Website: [email or web address]

## 2023-02-27 NOTE — Telephone Encounter (Signed)
Received provider portion- waiting on PT to return App.

## 2023-03-03 ENCOUNTER — Other Ambulatory Visit: Payer: Self-pay

## 2023-03-06 NOTE — Telephone Encounter (Signed)
 PAP: Application for [place] Pepper has been submitted to [employer]), via fax

## 2023-03-07 ENCOUNTER — Other Ambulatory Visit: Payer: HMO

## 2023-03-10 ENCOUNTER — Other Ambulatory Visit: Payer: Self-pay

## 2023-03-10 NOTE — Telephone Encounter (Signed)
 PAP: Patient assistance application for London Pepper has been approved by PAP Companies: [employer] from 03/10/2023 to 01/14/2024. Medication should be delivered to PAP Delivery: Home. For further shipping updates, please contact [employer] ([employer]) at [phone]. Patient ID is: [id number]

## 2023-03-10 NOTE — Progress Notes (Signed)
 Pharmacy Medication Assistance Program Note    03/10/2023  Patient ID: Rose Miller, female   DOB: 22-Sep-1955, 68 y.o.   MRN: [id number]     03/10/2023  Outreach Medication One  Initial Outreach Date (Medication One) 02/24/2023  Manufacturer Medication One [employer]  [employer] [facility] Jardiance  Type of Assistance Manufacturer Assistance  Date Application Sent to Patient 02/24/2023  Application Items Requested Application;Proof of Income  Date Application Sent to Prescriber 02/24/2023  Name of Prescriber [name]  Date Application Received From Patient 03/06/2023  Application Items Received From Patient Application;Proof of Income  Date Application Received From Provider 02/27/2023  Date Application Submitted to Manufacturer 03/06/2023  Method Application Sent to Manufacturer Fax  Patient Assistance Determination Approved  Approval Start Date 03/10/2023  Approval End Date 01/14/2024  Patient Notification Method MyChart

## 2023-03-10 NOTE — Progress Notes (Signed)
   03/10/2023  Patient ID: Rose Miller, female   DOB: 04-Jan-1956, 68 y.o.   MRN: [id number]  Subjective/Objective: Telephone visit to follow-up on management of T2DM and HTN  Diabetes Management Plan -Current medications: Jardiance 10 mg daily, Rybelsus 14 mg daily -Patient receives Jardiance through [facility] cares patient assistance program and was recently approved for 2025 reenrollment.  Patient also receives Rybelsus through [employer] patient assistance program, and current enrollment ends 04/10/2023. -Patient does monitor home fasting blood glucose and provides the following recent readings: 110, 105, 115, 108, 105, 116, 115 -She does not endorse any signs or symptoms of hypoglycemia -Patient has been on Rybelsus for several months now and tolerates well, but she would like the benefit of additional weight loss.  Questioning other GLP-1 medications that she may be able to get through patient assistance program.  Informed her that Ozempic is currently the only medication in this class (outside of Rybelsus) currently accepting new patients.  Patient tried Ozempic in the past and states she tolerated it well but also did not get weight benefits she was hoping for and did not like having to give an injection; but she is open to a retrial of Ozempic  Hypertension -Current medications: Spironolactone 25 mg daily, metoprolol succinate 25 mg daily, losartan 100 mg daily hydralazine 50 mg 3 times daily furosemide 20 mg daily as needed for swelling diltiazem 30 mg daily as needed for tachycardia -Patient does monitor home blood pressure and recent values average 121/66  Assessment/Plan  Diabetes Management Plan -Currently controlled based on A1c -2025 [employer] reenrollment application can be submitted on line starting 03/13/2023 -I recommend switching patient from Rybelsus 14 mg daily to Ozempic 1 mg weekly, and we can increase dose to 2 mg weekly if patient tolerates well after at least 4 weeks.  It appears  patient may have only titrated to 0.5 mg dose in the past, which is likely the reason she did not see additional weight loss benefit up. -Continue to monitor and record home fasting blood glucose -Patient sees Dr. Janee Morn again 03/17/2023 and will be due for follow-up A1c  Hypertension -Currently controlled based on home blood pressure readings -Continue current regimen at this time -Continue daily monitoring and recording of home blood pressure  Follow-up: Coordinating with Dr. Janee Morn to see if he is in agreement with diabetes management plan and will submit Nov[employer] reenrollment application, inform patient, and schedule follow-up visit appropriately  [name], PharmD, DPLA

## 2023-03-11 DIAGNOSIS — M792 Neuralgia and neuritis, unspecified: Secondary | ICD-10-CM | POA: Diagnosis not present

## 2023-03-11 DIAGNOSIS — B351 Tinea unguium: Secondary | ICD-10-CM | POA: Diagnosis not present

## 2023-03-11 DIAGNOSIS — M2041 Other hammer toe(s) (acquired), right foot: Secondary | ICD-10-CM | POA: Diagnosis not present

## 2023-03-11 DIAGNOSIS — E1142 Type 2 diabetes mellitus with diabetic polyneuropathy: Secondary | ICD-10-CM | POA: Diagnosis not present

## 2023-03-11 DIAGNOSIS — I739 Peripheral vascular disease, unspecified: Secondary | ICD-10-CM | POA: Diagnosis not present

## 2023-03-11 DIAGNOSIS — R600 Localized edema: Secondary | ICD-10-CM | POA: Diagnosis not present

## 2023-03-12 ENCOUNTER — Encounter: Payer: Self-pay | Admitting: Pulmonary Disease

## 2023-03-12 ENCOUNTER — Ambulatory Visit: Payer: HMO | Admitting: Pulmonary Disease

## 2023-03-12 VITALS — BP 116/78 | HR 88 | Temp 98.5°F | Ht 70.0 in | Wt 363.0 lb

## 2023-03-12 DIAGNOSIS — J431 Panlobular emphysema: Secondary | ICD-10-CM

## 2023-03-12 DIAGNOSIS — R0602 Shortness of breath: Secondary | ICD-10-CM

## 2023-03-12 MED ORDER — STIOLTO RESPIMAT 2.5-2.5 MCG/ACT IN AERS: 2.0000 | INHALATION_SPRAY | Freq: Every day | RESPIRATORY_TRACT | 3 refills | Status: DC

## 2023-03-12 NOTE — Progress Notes (Signed)
 Rose Miller    [id number]    1955-04-30  Primary Care Physician:Garnette Gunner, MD  Referring Physician: Garnette Gunner, MD [address] [place],  [place] [address]  Chief complaint:   Patient being evaluated for shortness of breath  HPI:  Shortness of breath with exertion  Not able to walk 100 yards  Normal show Starkes get short of breath Denies any wheezing  Quit smoking in 2013, about 30-pack-year smoking history  Shortness of breath will happen with a lot of activity Has not been exercising regularly  Denies any chest pains, no chest discomfort Does have a history of hypertension, diabetes, hypercholesterolemia, chronic kidney disease  History of irregular heartbeats, headaches   Outpatient Encounter Medications as of 03/12/2023  Medication Sig   amLODipine (NORVASC) 10 MG tablet Take 1 tablet (10 mg total) by mouth daily.   amoxicillin (AMOXIL) 500 MG tablet TAKE 4 TABLETS 1 HOUR PRIORTO DENTAL PROCEDURES AND   CLEANINGS (Patient not taking: Reported on 01/24/2023)   apixaban (ELIQUIS) 5 MG TABS tablet Take 1 tablet (5 mg total) by mouth 2 (two) times daily.   Ascorbic Acid (VITAMIN C) 1000 MG tablet Take 1,000 mg by mouth daily.   atorvastatin (LIPITOR) 10 MG tablet Take 1 tablet (10 mg total) by mouth daily.   azelastine (ASTELIN) 0.1 % nasal spray Place 2 sprays into both nostrils 2 (two) times daily. Use in each nostril as directed   Blood Glucose Monitoring Suppl (ONE TOUCH ULTRA 2) w/Device KIT Use as directed in the morning, at noon, and at bedtime.   Cholecalciferol 50 MCG (2000 UT) TABS Take 2,000 Units by mouth daily.   Coenzyme Q10 (CO Q-10) 100 MG CAPS Take 100 mg by mouth daily.    Cyanocobalamin (B-12) 500 MCG TABS Take 500 mcg by mouth daily.   diltiazem (CARDIZEM) 30 MG tablet Take 1 tablet (30 mg total) by mouth every 6 (six) hours as needed (fast heart rate, heart palpitations).   empagliflozin (JARDIANCE) 10 MG TABS  tablet Take 1 tablet (10 mg total) by mouth daily before breakfast.   fluticasone (FLONASE) 50 MCG/ACT nasal spray Place 2 sprays into both nostrils daily.   furosemide (LASIX) 20 MG tablet Take 1 tablet (20 mg total) by mouth daily as needed (for lower extremity swelling).   hydrALAZINE (APRESOLINE) 50 MG tablet Take 1 tablet (50 mg total) by mouth 3 (three) times daily.   ipratropium (ATROVENT) 0.06 % nasal spray Place 2 sprays into both nostrils 4 (four) times daily.   losartan (COZAAR) 100 MG tablet Take 1 tablet (100 mg total) by mouth daily. Discontinue losartan/hctz   Menthol, Topical Analgesic, (BLUE-EMU MAXIMUM STRENGTH) 2.5 % LIQD Apply 1 application topically daily as needed (pain).   metoprolol succinate (TOPROL-XL) 25 MG 24 hr tablet Take 1 tablet (25 mg total) by mouth daily.   montelukast (SINGULAIR) 10 MG tablet TAKE 1 TABLET BY MOUTH AT BEDTIME   mupirocin ointment (BACTROBAN) 2 % Apply 1 Application topically 2 (two) times daily for 14 days   Olopatadine HCl 0.2 % SOLN Apply 1 drop to eye in the morning and at bedtime.   omeprazole (PRILOSEC) 20 MG capsule Take 1 capsule (20 mg total) by mouth daily.   Semaglutide (RYBELSUS) 14 MG TABS Take 1 tablet (14 mg total) by mouth daily.   spironolactone (ALDACTONE) 25 MG tablet Take 1 tablet (25 mg total) by mouth daily.   Zinc 50 MG  TABS Take 50 mg by mouth daily.   No facility-administered encounter medications on file as of 03/12/2023.    Allergies as of 03/12/2023   (No Known Allergies)    Past Medical History:  Diagnosis Date   Adhesive capsulitis    Aortic valve disease    Arthritis    Asthma    Back pain    CKD (chronic kidney disease) stage 3, GFR 30-59 ml/min (HCC)    Constipation    COPD (chronic obstructive pulmonary disease) (HCC)    Diabetes mellitus without complication (HCC)    Diuretic-induced hypokalemia    Hypercholesterolemia    Hypertension    IT band syndrome    Morbid obesity (HCC)    Palpitations     Periumbilical pain    Postmenopausal estrogen deficiency    Prediabetes    S/P TAVR (transcatheter aortic valve replacement) 12/26/2020   Ventral hernia     Past Surgical History:  Procedure Laterality Date   BIOPSY  05/01/2021   Procedure: BIOPSY;  Surgeon: Lynann Bologna, MD;  Location: [facility];  Service: Gastroenterology;;   CESAREAN SECTION  1989   COLONOSCOPY     COLONOSCOPY WITH PROPOFOL N/A 05/01/2021   Procedure: COLONOSCOPY WITH PROPOFOL;  Surgeon: Lynann Bologna, MD;  Location: [facility];  Service: Gastroenterology;  Laterality: N/A;   ESOPHAGOGASTRODUODENOSCOPY (EGD) WITH PROPOFOL N/A 05/01/2021   Procedure: ESOPHAGOGASTRODUODENOSCOPY (EGD) WITH PROPOFOL;  Surgeon: Lynann Bologna, MD;  Location: [facility];  Service: Gastroenterology;  Laterality: N/A;   INTRAOPERATIVE TRANSTHORACIC ECHOCARDIOGRAM N/A 12/26/2020   Procedure: INTRAOPERATIVE TRANSTHORACIC ECHOCARDIOGRAM;  Surgeon: Orbie Pyo, MD;  Location: [facility];  Service: Open Heart Surgery;  Laterality: N/A;   MALONEY DILATION  05/01/2021   Procedure: Elease Hashimoto DILATION;  Surgeon: Lynann Bologna, MD;  Location: [facility];  Service: Gastroenterology;;   POLYPECTOMY  05/01/2021   Procedure: POLYPECTOMY;  Surgeon: Lynann Bologna, MD;  Location: [facility];  Service: Gastroenterology;;   RIGHT/LEFT HEART CATH AND CORONARY ANGIOGRAPHY N/A 11/24/2020   Procedure: RIGHT/LEFT HEART CATH AND CORONARY ANGIOGRAPHY;  Surgeon: Lyn Records, MD;  Location: [facility];  Service: Cardiovascular;  Laterality: N/A;   TONSILLECTOMY AND ADENOIDECTOMY  1960   TRANSCATHETER AORTIC VALVE REPLACEMENT, TRANSFEMORAL N/A 12/26/2020   Procedure: TRANSCATHETER AORTIC VALVE REPLACEMENT, TRANSFEMORAL;  Surgeon: Orbie Pyo, MD;  Location: [facility];  Service: Open Heart Surgery;  Laterality: N/A;    Family History  Problem Relation Age of Onset   Dementia Mother    Arthritis Mother    Cancer Mother         pancreatic   Hypertension Mother    Obesity Mother    Cancer Father        prostate and colon   Colon cancer Father    Heart disease Father    Thyroid disease Father    Breast cancer Neg Hx     Social History   Socioeconomic History   Marital status: Single    Spouse name: Not on file   Number of children: 1   Years of education: Not on file   Highest education level: Not on file  Occupational History   Occupation: retired  Tobacco Use   Smoking status: Former    Current packs/day: 0.00    Average packs/day: 1 pack/day for 38.0 years (38.0 ttl pk-yrs)    Types: Cigarettes    Start date: 3    Quit date: 2013    Years since quitting: 12.1  Passive exposure: Never   Smokeless tobacco: Never  Vaping Use   Vaping status: Never Used  Substance and Sexual Activity   Alcohol use: Yes    Comment: socially   Drug use: No   Sexual activity: Not on file  Other Topics Concern   Not on file  Social History Narrative   Not on file   Social Drivers of Health   Financial Resource Strain: Low Risk  (12/13/2022)   Overall Financial Resource Strain (CARDIA)    Difficulty of Paying Living Expenses: Not very hard  Food Insecurity: No Food Insecurity (12/23/2022)   Hunger Vital Sign    Worried About Running Out of Food in the Last Year: Never true    Ran Out of Food in the Last Year: Never true  Transportation Needs: No Transportation Needs (12/23/2022)   PRAPARE - Administrator, Civil Service (Medical): No    Lack of Transportation (Non-Medical): No  Physical Activity: Inactive (12/13/2022)   Exercise Vital Sign    Days of Exercise per Week: 0 days    Minutes of Exercise per Session: 0 min  Stress: No Stress Concern Present (12/13/2022)   [employer] of Occupational Health - Occupational Stress Questionnaire    Feeling of Stress : Only a little  Social Connections: Unknown (12/13/2022)   Social Connection and Isolation Panel [NHANES]    Frequency of  Communication with Friends and Family: More than three times a week    Frequency of Social Gatherings with Friends and Family: Once a week    Attends Religious Services: Never    Database administrator or Organizations: No    Attends Banker Meetings: Never    Marital Status: Patient declined  Catering manager Violence: Not At Risk (12/23/2022)   Humiliation, Afraid, Rape, and Kick questionnaire    Fear of Current or Ex-Partner: No    Emotionally Abused: No    Physically Abused: No    Sexually Abused: No    Review of Systems  Respiratory:  Positive for shortness of breath. Negative for chest tightness.   Cardiovascular:  Negative for chest pain.    There were no vitals filed for this visit.   Physical Exam Constitutional:      Appearance: She is obese.  HENT:     Head: Normocephalic.     Nose: Nose normal.     Mouth/Throat:     Mouth: Mucous membranes are moist.  Eyes:     General: No scleral icterus. Cardiovascular:     Rate and Rhythm: Normal rate and regular rhythm.     Heart sounds: Murmur heard.  Pulmonary:     Effort: No respiratory distress.     Breath sounds: No stridor. No wheezing or rhonchi.  Musculoskeletal:     Cervical back: No rigidity or tenderness.  Neurological:     Mental Status: She is alert.  Psychiatric:        Mood and Affect: Mood normal.      Data Reviewed: Echocardiogram 12/27/2022 with diastolic dysfunction  CT chest 07/17/2022 reviewed by myself showing evidence of emphysema  Spirometry 10/09/2021-no significant obstructive disease-reviewed by myself   Assessment:  Shortness of breath  Deconditioning likely playing a role  30-pack-year smoking history, emphysema on CT scan of the chest -Did use albuterol in the past  History of TAVR surgery in 2022  Plan/Recommendations: We will schedule you for a breathing study  Prescription for Stiolto sent to pharmacy for you it is  an inhaler that is used once a  day  Graded activities as tolerated  Weight loss efforts as tolerated  Follow-up in about 8 weeks  I spent 45 minutes dedicated to the care of this patient on the date of this encounter to include previsit review of records, face-to-face time with the patient discussing conditions above, post visit ordering of testing,ordering medications,independentlyinterpreting results, clinical documentation with electronic health record and communicated necessary findings to members of the patient's care team   Virl Diamond MD [facility] 03/12/2023, 2:02 PM  CC: Garnette Gunner, MD

## 2023-03-12 NOTE — Patient Instructions (Addendum)
 We will schedule you for breathing study  Stiolto inhaler sent to pharmacy for you it is 2 puffs daily  Graded activities as tolerated  Make sure you start exercising on a regular basis  Focus on some weight loss  Call us with significant concerns  Follow-up in about 8 weeks

## 2023-03-14 ENCOUNTER — Telehealth: Payer: Self-pay

## 2023-03-14 ENCOUNTER — Other Ambulatory Visit (HOSPITAL_COMMUNITY): Payer: Self-pay

## 2023-03-14 DIAGNOSIS — R3121 Asymptomatic microscopic hematuria: Secondary | ICD-10-CM | POA: Diagnosis not present

## 2023-03-14 NOTE — Progress Notes (Signed)
   03/14/2023  Patient ID: Rose Miller, female   DOB: July 17, 1955, 68 y.o.   MRN: [id number]  Submitted [employer] PAP 2025 re-enrollment application online- changing patient from Rybelsus 14mg .  Contacted [employer], and application was approved as of 2/27.  Representative states medication should arrive at Dr. Carollee Massed office in 10-14 business days, but they are experiencing delivery delays.  Contacting patient to inform.  [name], PharmD, DPLA

## 2023-03-14 NOTE — Telephone Encounter (Signed)
 Received fax from [employer] patient assistance program for patient's Ozempic 1mg  weekly injection (4 boxes). Form was completed by Sabino Niemann, [facility]. Form was faxed to [phone] as requested and placed in scan folder.

## 2023-03-17 ENCOUNTER — Other Ambulatory Visit: Payer: Self-pay

## 2023-03-17 ENCOUNTER — Other Ambulatory Visit (HOSPITAL_COMMUNITY): Payer: Self-pay

## 2023-03-17 ENCOUNTER — Encounter: Payer: Self-pay | Admitting: Pharmacist

## 2023-03-17 ENCOUNTER — Encounter: Payer: Self-pay | Admitting: Family Medicine

## 2023-03-17 ENCOUNTER — Ambulatory Visit: Payer: HMO | Admitting: Family Medicine

## 2023-03-17 VITALS — BP 126/82 | HR 97 | Temp 97.8°F | Ht 70.0 in | Wt 359.8 lb

## 2023-03-17 DIAGNOSIS — E1122 Type 2 diabetes mellitus with diabetic chronic kidney disease: Secondary | ICD-10-CM

## 2023-03-17 DIAGNOSIS — E669 Obesity, unspecified: Secondary | ICD-10-CM | POA: Diagnosis not present

## 2023-03-17 DIAGNOSIS — M545 Low back pain, unspecified: Secondary | ICD-10-CM | POA: Diagnosis not present

## 2023-03-17 DIAGNOSIS — Z7984 Long term (current) use of oral hypoglycemic drugs: Secondary | ICD-10-CM

## 2023-03-17 DIAGNOSIS — E1169 Type 2 diabetes mellitus with other specified complication: Secondary | ICD-10-CM | POA: Diagnosis not present

## 2023-03-17 DIAGNOSIS — L02212 Cutaneous abscess of back [any part, except buttock]: Secondary | ICD-10-CM | POA: Diagnosis not present

## 2023-03-17 DIAGNOSIS — N1831 Chronic kidney disease, stage 3a: Secondary | ICD-10-CM | POA: Diagnosis not present

## 2023-03-17 DIAGNOSIS — Z7985 Long-term (current) use of injectable non-insulin antidiabetic drugs: Secondary | ICD-10-CM

## 2023-03-17 DIAGNOSIS — M25562 Pain in left knee: Secondary | ICD-10-CM | POA: Diagnosis not present

## 2023-03-17 DIAGNOSIS — M25561 Pain in right knee: Secondary | ICD-10-CM | POA: Diagnosis not present

## 2023-03-17 DIAGNOSIS — N183 Chronic kidney disease, stage 3 unspecified: Secondary | ICD-10-CM

## 2023-03-17 DIAGNOSIS — G8929 Other chronic pain: Secondary | ICD-10-CM | POA: Diagnosis not present

## 2023-03-17 LAB — POCT GLYCOSYLATED HEMOGLOBIN (HGB A1C): Hemoglobin A1C: 5.8 % — AB (ref 4.0–5.6)

## 2023-03-17 MED ORDER — DOXYCYCLINE HYCLATE 100 MG PO TABS
100.0000 mg | ORAL_TABLET | Freq: Two times a day (BID) | ORAL | 0 refills | Status: AC
  Filled 2023-03-17: qty 14, 7d supply, fill #0

## 2023-03-17 NOTE — Progress Notes (Signed)
 Assessment/Plan:      Type 2 Diabetes Mellitus Type 2 Diabetes Mellitus is well-managed with an A1c of 5.8, below the target of 7. Currently on Jardiance 10 mg daily. Approved for Ozempic and will switch from Rybelsus 14 mg to semaglutide 1 mg once it arrives. Monitors blood glucose daily with stable readings. No symptoms of hyperglycemia. Discussed benefits of semaglutide for diabetes management and weight loss. Plan to assess tolerance and effectiveness of semaglutide in one month. - Switch from Rybelsus 14 mg to semaglutide 1 mg once it arrives - Continue Jardiance 10 mg daily - Follow up in one month to assess tolerance and effectiveness of semaglutide  Obesity BMI of 51.6. Lost approximately 4 pounds since the last visit, now weighing 259 pounds. Weight loss attributed to increased physical activity, including walking. Also using semaglutide for weight management. Discussed benefits of swimming as a low-impact exercise option. - Continue increased physical activity, including walking - Consider swimming as a low-impact exercise option - Monitor weight and discuss progress at the next visit  Abscess on Back Tender abscess on her back present for about a week. No signs of systemic infection. Abscess drained during the visit, and a wound culture was obtained. High risk for complicated infections due to diabetes and atrial fibrillation. Discussed oral antibiotics versus drainage; opted for drainage. - Perform incision and drainage (I&D) of the abscess - Obtain wound culture - Prescribe doxycycline 100 mg twice a day - Provide care instructions and return recommendations, including monitoring for fever, chills, increased pain, or other worrisome symptoms  Bilateral Knee Pain Reports bilateral knee pain, with the left knee more affected. Pain improved with increased activity. Uses Blue Emu for topical relief and occasionally takes Aleve, although advised to be cautious due to apixaban.  Discussed risks of GI bleeding with NSAIDs and potential benefit of Voltaren gel as an alternative. - Continue using Blue Emu for topical relief - Avoid NSAIDs like Aleve due to the risk of GI bleeding with apixaban - Consider Voltaren (diclofenac) gel for pain relief if needed  Back Pain Reports mild back pain that has improved with increased activity. Uses Blue Emu for topical relief. - Continue increased physical activity - Continue using Blue Emu for topical relief  Follow-up - Follow up in one month to assess tolerance and effectiveness of semaglutide - Monitor weight and discuss progress at the next visit - Evaluate knee and back pain management at the next visit.        There are no discontinued medications.  Return in about 4 weeks (around 04/14/2023) for weight check.    Subjective:   Encounter date: 03/17/2023  Rose Miller is a 68 y.o. female who has Ventral hernia; IT band syndrome; Right shoulder pain; Periumbilical pain; Hyperlipidemia LDL goal <70; Class 3 severe obesity due to excess calories with body mass index (BMI) of 50.0 to 59.9 in adult [facility]); Type 2 diabetes mellitus with obesity (HCC); CKD stage 3 secondary to diabetes (HCC); Postmenopausal estrogen deficiency; Former smoker; Long-term use of aspirin therapy; Hypertension associated with diabetes (HCC); Aortic valve disease; COPD (chronic obstructive pulmonary disease) (HCC); Pharyngoesophageal dysphagia; Family history of colon cancer in father; Diplopia; Allergic rhinitis; PAF (paroxysmal atrial fibrillation) (HCC); Otalgia of left ear; Ingrown nail of great toe of left foot; Vitamin D deficiency; Low back pain; Chronic pain of both knees; Dyspnea on exertion; Coronary artery disease due to calcified coronary lesion; Aortic atherosclerosis (HCC); and Abscess of lower back on their problem list..  She  has a past medical history of Adhesive capsulitis, Aortic valve disease, Arthritis, Asthma, Back pain, CKD  (chronic kidney disease) stage 3, GFR 30-59 ml/min (HCC), Constipation, COPD (chronic obstructive pulmonary disease) (HCC), Diabetes mellitus without complication (HCC), Diuretic-induced hypokalemia, Hypercholesterolemia, Hypertension, IT band syndrome, Morbid obesity (HCC), Palpitations, Periumbilical pain, Postmenopausal estrogen deficiency, Prediabetes, S/P TAVR (transcatheter aortic valve replacement) (12/26/2020), and Ventral hernia..   She presents with chief complaint of Medical Management of Chronic Issues ( 3 months (around 03/16/2023) for BP, DM. Fasting.  Knee and back pain continuing. Feels a bump on left lower back. Painful to the touch. ) .   Discussed the use of AI scribe software for clinical note transcription with the patient, who gave verbal consent to proceed.  History of Present Illness   Rose Miller is a 68 year old female with hypertension and diabetes who presents for follow-up on blood pressure, diabetes, back pain, and knee pain.  Her diabetes is well controlled with an A1c of 5.8, consistent with previous levels. She monitors her blood sugar daily and has stable readings. She is currently taking Rybelsus 14 mg daily Jardiance 10 mg daily for diabetes management.  Both knees are affected by pain, with the left knee being more problematic. The pain has been present for some time but has improved with increased activity. She has been trying to walk more, which she wasn't doing much before. For pain management, she uses Blue Emu rub and occasionally takes Aleve, although she is cautious due to her blood thinner medication, apixaban.  She mentions a bump on her back that has been present for about a week. It is tender, especially when lying on that side, but there is no drainage or significant changes in size. No fever or chills. She is not currently on antibiotics but requires amoxicillin before dental procedures as advised by her cardiologist.      Incision and Drainage  Procedure Note  Pre-operative Diagnosis: Back abscess  Post-operative Diagnosis: same  Indications: Treatment  Anesthesia: 1% lidocaine with epinephrine  Procedure Details  The procedure, risks and complications have been discussed in detail (including, but not limited to airway compromise, infection, bleeding) with the patient, and the patient has signed consent to the procedure.  The skin was sterilely prepped and draped over the affected area in the usual fashion. After adequate local anesthesia, I&D with a #11 blade was performed on the left, lower back. Purulent drainage: present The patient was observed until stable.  Findings: Explored.  No loculations observed.  EBL: Minimal   Condition: Tolerated procedure well   Complications: none.   Review of Systems  Constitutional:  Negative for chills and fever.  Genitourinary:  Negative for frequency and urgency.  Endo/Heme/Allergies:  Negative for polydipsia.  All other systems reviewed and are negative.   Past Surgical History:  Procedure Laterality Date   BIOPSY  05/01/2021   Procedure: BIOPSY;  Surgeon: Lynann Bologna, MD;  Location: [facility];  Service: Gastroenterology;;   CESAREAN SECTION  1989   COLONOSCOPY     COLONOSCOPY WITH PROPOFOL N/A 05/01/2021   Procedure: COLONOSCOPY WITH PROPOFOL;  Surgeon: Lynann Bologna, MD;  Location: [facility];  Service: Gastroenterology;  Laterality: N/A;   ESOPHAGOGASTRODUODENOSCOPY (EGD) WITH PROPOFOL N/A 05/01/2021   Procedure: ESOPHAGOGASTRODUODENOSCOPY (EGD) WITH PROPOFOL;  Surgeon: Lynann Bologna, MD;  Location: [facility];  Service: Gastroenterology;  Laterality: N/A;   INTRAOPERATIVE TRANSTHORACIC ECHOCARDIOGRAM N/A 12/26/2020   Procedure: INTRAOPERATIVE TRANSTHORACIC ECHOCARDIOGRAM;  Surgeon: Orbie Pyo,  MD;  Location: [facility];  Service: Open Heart Surgery;  Laterality: N/A;   MALONEY DILATION  05/01/2021   Procedure: Elease Hashimoto DILATION;  Surgeon: Lynann Bologna, MD;  Location: [facility];  Service: Gastroenterology;;   POLYPECTOMY  05/01/2021   Procedure: POLYPECTOMY;  Surgeon: Lynann Bologna, MD;  Location: [facility];  Service: Gastroenterology;;   RIGHT/LEFT HEART CATH AND CORONARY ANGIOGRAPHY N/A 11/24/2020   Procedure: RIGHT/LEFT HEART CATH AND CORONARY ANGIOGRAPHY;  Surgeon: Lyn Records, MD;  Location: [facility] CV [facility];  Service: Cardiovascular;  Laterality: N/A;   TONSILLECTOMY AND ADENOIDECTOMY  1960   TRANSCATHETER AORTIC VALVE REPLACEMENT, TRANSFEMORAL N/A 12/26/2020   Procedure: TRANSCATHETER AORTIC VALVE REPLACEMENT, TRANSFEMORAL;  Surgeon: Orbie Pyo, MD;  Location: [facility];  Service: Open Heart Surgery;  Laterality: N/A;    Outpatient Medications Prior to Visit  Medication Sig Dispense Refill   amLODipine (NORVASC) 10 MG tablet Take 1 tablet (10 mg total) by mouth daily. 90 tablet 3   amoxicillin (AMOXIL) 500 MG tablet TAKE 4 TABLETS 1 HOUR PRIORTO DENTAL PROCEDURES AND   CLEANINGS 12 tablet 6   apixaban (ELIQUIS) 5 MG TABS tablet Take 1 tablet (5 mg total) by mouth 2 (two) times daily. 180 tablet 1   Ascorbic Acid (VITAMIN C) 1000 MG tablet Take 1,000 mg by mouth daily.     atorvastatin (LIPITOR) 10 MG tablet Take 1 tablet (10 mg total) by mouth daily. 90 tablet 3   azelastine (ASTELIN) 0.1 % nasal spray Place 2 sprays into both nostrils 2 (two) times daily. Use in each nostril as directed 30 mL 12   Blood Glucose Monitoring Suppl (ONE TOUCH ULTRA 2) w/Device KIT Use as directed in the morning, at noon, and at bedtime. 1 kit 0   Cholecalciferol 50 MCG (2000 UT) TABS Take 2,000 Units by mouth daily.     Coenzyme Q10 (CO Q-10) 100 MG CAPS Take 100 mg by mouth daily.      Cyanocobalamin (B-12) 500 MCG TABS Take 500 mcg by mouth daily.     diltiazem (CARDIZEM) 30 MG tablet Take 1 tablet (30 mg total) by mouth every 6 (six) hours as needed (fast heart rate, heart palpitations). 30 tablet 6   empagliflozin  (JARDIANCE) 10 MG TABS tablet Take 1 tablet (10 mg total) by mouth daily before breakfast. 90 tablet 0   fluticasone (FLONASE) 50 MCG/ACT nasal spray Place 2 sprays into both nostrils daily. 16 g 6   furosemide (LASIX) 20 MG tablet Take 1 tablet (20 mg total) by mouth daily as needed (for lower extremity swelling). 30 tablet 4   hydrALAZINE (APRESOLINE) 50 MG tablet Take 1 tablet (50 mg total) by mouth 3 (three) times daily. 270 tablet 3   ipratropium (ATROVENT) 0.06 % nasal spray Place 2 sprays into both nostrils 4 (four) times daily. 15 mL 12   losartan (COZAAR) 100 MG tablet Take 1 tablet (100 mg total) by mouth daily. Discontinue losartan/hctz 90 tablet 3   Menthol, Topical Analgesic, (BLUE-EMU MAXIMUM STRENGTH) 2.5 % LIQD Apply 1 application topically daily as needed (pain).     metoprolol succinate (TOPROL-XL) 25 MG 24 hr tablet Take 1 tablet (25 mg total) by mouth daily. 90 tablet 3   montelukast (SINGULAIR) 10 MG tablet TAKE 1 TABLET BY MOUTH AT BEDTIME 30 tablet 0   mupirocin ointment (BACTROBAN) 2 % Apply 1 Application topically 2 (two) times daily for 14 days 22 g 3   Olopatadine HCl  0.2 % SOLN Apply 1 drop to eye in the morning and at bedtime. 2.5 mL 3   omeprazole (PRILOSEC) 20 MG capsule Take 1 capsule (20 mg total) by mouth daily. 30 capsule 11   Semaglutide, 1 MG/DOSE, (OZEMPIC, 1 MG/DOSE,) 4 MG/3ML SOPN Inject 1 mg into the skin once a week.     spironolactone (ALDACTONE) 25 MG tablet Take 1 tablet (25 mg total) by mouth daily. 90 tablet 2   Tiotropium Bromide-Olodaterol (STIOLTO RESPIMAT) 2.5-2.5 MCG/ACT AERS Inhale 2 puffs into the lungs daily. 4 g 3   Zinc 50 MG TABS Take 50 mg by mouth daily.     No facility-administered medications prior to visit.    Family History  Problem Relation Age of Onset   Dementia Mother    Arthritis Mother    Cancer Mother        pancreatic   Hypertension Mother    Obesity Mother    Cancer Father        prostate and colon   Colon cancer  Father    Heart disease Father    Thyroid disease Father    Breast cancer Neg Hx     Social History   Socioeconomic History   Marital status: Single    Spouse name: Not on file   Number of children: 1   Years of education: Not on file   Highest education level: Not on file  Occupational History   Occupation: retired  Tobacco Use   Smoking status: Former    Current packs/day: 0.00    Average packs/day: 1 pack/day for 38.0 years (38.0 ttl pk-yrs)    Types: Cigarettes    Start date: 32    Quit date: 2013    Years since quitting: 12.1    Passive exposure: Never   Smokeless tobacco: Never  Vaping Use   Vaping status: Never Used  Substance and Sexual Activity   Alcohol use: Yes    Comment: socially   Drug use: No   Sexual activity: Not on file  Other Topics Concern   Not on file  Social History Narrative   Not on file   Social Drivers of Health   Financial Resource Strain: Patient Declined (03/13/2023)   Overall Financial Resource Strain (CARDIA)    Difficulty of Paying Living Expenses: Patient declined  Food Insecurity: Patient Declined (03/13/2023)   Hunger Vital Sign    Worried About Running Out of Food in the Last Year: Patient declined    Ran Out of Food in the Last Year: Patient declined  Transportation Needs: Patient Declined (03/13/2023)   PRAPARE - Administrator, Civil Service (Medical): Patient declined    Lack of Transportation (Non-Medical): Patient declined  Physical Activity: Unknown (03/13/2023)   Exercise Vital Sign    Days of Exercise per Week: Patient declined    Minutes of Exercise per Session: 0 min  Recent Concern: Physical Activity - Inactive (12/13/2022)   Exercise Vital Sign    Days of Exercise per Week: 0 days    Minutes of Exercise per Session: 0 min  Stress: Patient Declined (03/13/2023)   [employer] of Occupational Health - Occupational Stress Questionnaire    Feeling of Stress : Patient declined  Social Connections:  Unknown (03/13/2023)   Social Connection and Isolation Panel [NHANES]    Frequency of Communication with Friends and Family: Patient declined    Frequency of Social Gatherings with Friends and Family: Patient declined    Attends Religious Services:  Patient declined    Active Member of Clubs or Organizations: Patient declined    Attends Banker Meetings: Never    Marital Status: Patient declined  Intimate Partner Violence: Not At Risk (12/23/2022)   Humiliation, Afraid, Rape, and Kick questionnaire    Fear of Current or Ex-Partner: No    Emotionally Abused: No    Physically Abused: No    Sexually Abused: No                                                                                                  Objective:  Physical Exam: BP 126/82   Pulse 97   Temp 97.8 F (36.6 C) (Temporal)   Ht 5\' 10"  (1.778 m)   Wt (!) 359 lb 12.8 oz (163.2 kg)   SpO2 99%   BMI 51.63 kg/m   Wt Readings from Last 3 Encounters:  03/17/23 (!) 359 lb 12.8 oz (163.2 kg)  03/12/23 (!) 363 lb (164.7 kg)  01/24/23 (!) 354 lb (160.6 kg)     Physical Exam Constitutional:      Appearance: She is obese.  HENT:     Head: Normocephalic and atraumatic.     Nose: Nose normal. No congestion.  Eyes:     General: No scleral icterus.    Extraocular Movements: Extraocular movements intact.  Cardiovascular:     Rate and Rhythm: Normal rate and regular rhythm.     Pulses: Normal pulses.     Heart sounds: Murmur heard.  Pulmonary:     Effort: Pulmonary effort is normal. No respiratory distress.     Breath sounds: Normal breath sounds.  Abdominal:     General: Abdomen is flat. Bowel sounds are normal.     Palpations: Abdomen is soft.  Musculoskeletal:        General: Normal range of motion.     Lumbar back: Tenderness present. Negative right straight leg raise test and negative left straight leg raise test.     Right knee: Normal range of motion. No tenderness.     Left knee: Normal range of  motion. No tenderness.  Lymphadenopathy:     Cervical: No cervical adenopathy.  Skin:    General: Skin is warm and dry.     Findings: Abscess present.       Neurological:     General: No focal deficit present.     Mental Status: She is alert and oriented to person, place, and time. Mental status is at baseline.  Psychiatric:        Mood and Affect: Mood normal.        Behavior: Behavior normal.        Thought Content: Thought content normal.        Judgment: Judgment normal.     DG Bone Density Result Date: 02/21/2023 EXAM: DUAL X-RAY ABSORPTIOMETRY (DXA) FOR BONE MINERAL DENSITY IMPRESSION: Referring Physician:  Garnette Gunner Your patient completed a bone mineral density test using GE Lunar iDXA system (analysis version: 16). Technologist: BEC PATIENT: Name: Libby, Goehring Patient ID: [id number] Birth Date: 12/18/1955 Height: 70.0 in.  Sex: Female Measured: 02/21/2023 Weight: 357.4 lbs. Indications: COPD, Diabetic non insulin, Eliquis, Estrogen Deficient, Height Loss (781.91), Low Calcium Intake, Omeprazole, Postmenopausal, Secondary Osteoporosis, Vitamin D Deficient Fractures: None Treatments: Vitamin D (E933.5) ASSESSMENT: The BMD measured at Femur Neck Left is 0.931 g/cm2 with a T-score of -0.8. This patient is considered normal according to World Health Organization [facility]) criteria. The lumbar spine was excluded due to machine limitations. The quality of the exam is limited by patient body habitus. Site Region Measured Date Measured Age YA BMD Significant CHANGE T-score DualFemur Neck Left 1/21/2023 69.1 -0.6 0.931 g/cm2 * DualFemur Neck Left 05/14/2019 65.3 0.2 1.069 g/cm2 DualFemur Total Mean 02/21/2023 69.1 0.3 1.042 g/cm2 * DualFemur Total Mean 05/14/2019 65.3 0.9 1.122 g/cm2 Right Forearm Radius 33% 02/21/2023 69.1 0.7 0.949 g/cm2 World Health Organization [facility]) criteria for post-menopausal, Caucasian Women: Normal       T-score at or above -1 SD Osteopenia   T-score between -1 and  -2.5 SD Osteoporosis T-score at or below -2.5 SD RECOMMENDATION: 1. All patients should optimize calcium and vitamin D intake. 2. Consider FDA-approved medical therapies in postmenopausal women and men aged 27 years and older, based on the following: a. A hip or vertebral (clinical or morphometric) fracture. b. T-score = -2.5 at the femoral neck or spine after appropriate evaluation to exclude secondary causes. c. Low bone mass (T-score between -1.0 and -2.5 at the femoral neck or spine) and a 10-year probability of a hip fracture = 3% or a 10-year probability of a major osteoporosis-related fracture = 20% based on the [place]-adapted WHO algorithm. d. Clinician judgment and/or patient preferences may indicate treatment for people with 10-year fracture probabilities above or below these levels. FOLLOW-UP: Patients with diagnosis of osteoporosis or at high risk for fracture should have regular bone mineral density tests.? Patients eligible for Medicare are allowed routine testing every 2 years.? The testing frequency can be increased to one year for patients who have rapidly progressing disease, are receiving or discontinuing medical therapy to restore bone mass, or have additional risk factors. I have reviewed this study and agree with the findings. [facility] Electronically Signed   By: Baird Lyons M.D.   On: 02/21/2023 10:40   ECHOCARDIOGRAM COMPLETE Result Date: 12/27/2022    ECHOCARDIOGRAM REPORT   Patient Name:   GERTURDE KUBA Date of Exam: 12/27/2022 Medical Rec #:  [id number]        Height:       70.0 in Accession #:    [id number]       Weight:       361.0 lb Date of Birth:  Aug 15, 1955       BSA:          2.683 m Patient Age:    66 years         BP:           132/78 mmHg Patient Gender: F                HR:           64 bpm. Exam Location:  [address] Procedure: 2D Echo, 3D Echo, Cardiac Doppler, Color Doppler and Strain Analysis Indications:    Z95.2 TAVR  History:        Patient has  prior history of Echocardiogram examinations, most                 recent 12/26/2021. CAD, COPD, Arrythmias:Atrial Fibrillation;  Risk Factors:Morbid obesity, Dyslipidemia, Diabetes and Former                 Smoker. Previous echo revealed LVEF 65% mild MR TAVR with mean                 gradient 20.5 mmHg and mildly dilated Ao root 40 mm.  Sonographer:    Chanetta Marshall [facility], RDCS Referring Phys: [id number] Wille Celeste Airen Dales IMPRESSIONS  1. Left ventricular ejection fraction, by estimation, is 60 to 65%. The left ventricle has normal function. The left ventricle has no regional wall motion abnormalities. There is moderate concentric left ventricular hypertrophy. Left ventricular diastolic parameters are consistent with Grade II diastolic dysfunction (pseudonormalization). The average left ventricular global longitudinal strain is -17.9 %. The global longitudinal strain is abnormal.  2. Right ventricular systolic function is normal. The right ventricular size is normal. There is normal pulmonary artery systolic pressure. The estimated right ventricular systolic pressure is 19.5 mmHg.  3. The mitral valve is normal in structure. No evidence of mitral valve regurgitation. No evidence of mitral stenosis.  4. Bioprosthetic aortic valve s/p TAVR with Edwards Sapien 3 THV. Mean gradient elevated at 23 mmHg with DI 0.48 and EOA 1.76 cm^2. No peri-valvular leakage noted.  5. Aortic dilatation noted. There is mild dilatation of the ascending aorta, measuring 39 mm.  6. The inferior vena cava is normal in size with greater than 50% respiratory variability, suggesting right atrial pressure of 3 mmHg. FINDINGS  Left Ventricle: Left ventricular ejection fraction, by estimation, is 60 to 65%. The left ventricle has normal function. The left ventricle has no regional wall motion abnormalities. The average left ventricular global longitudinal strain is -17.9 %. The global longitudinal strain is abnormal. The left  ventricular internal cavity size was normal in size. There is moderate concentric left ventricular hypertrophy. Left ventricular diastolic parameters are consistent with Grade II diastolic dysfunction (pseudonormalization). Right Ventricle: The right ventricular size is normal. No increase in right ventricular wall thickness. Right ventricular systolic function is normal. There is normal pulmonary artery systolic pressure. The tricuspid regurgitant velocity is 2.03 m/s, and  with an assumed right atrial pressure of 3 mmHg, the estimated right ventricular systolic pressure is 19.5 mmHg. Left Atrium: Left atrial size was normal in size. Right Atrium: Right atrial size was normal in size. Pericardium: There is no evidence of pericardial effusion. Mitral Valve: The mitral valve is normal in structure. Mild mitral annular calcification. No evidence of mitral valve regurgitation. No evidence of mitral valve stenosis. Tricuspid Valve: The tricuspid valve is normal in structure. Tricuspid valve regurgitation is trivial. Aortic Valve: Bioprosthetic aortic valve s/p TAVR with Edwards Sapien 3 THV. Mean gradient elevated at 23 mmHg with DI 0.48 and EOA 1.76 cm^2. No peri-valvular leakage noted. The aortic valve has been repaired/replaced. Aortic valve regurgitation is not visualized. Aortic valve mean gradient measures 23.0 mmHg. Aortic valve peak gradient measures 40.7 mmHg. Aortic valve area, by VTI measures 1.46 cm. Pulmonic Valve: The pulmonic valve was normal in structure. Pulmonic valve regurgitation is not visualized. Aorta: The aortic root is normal in size and structure and aortic dilatation noted. There is mild dilatation of the ascending aorta, measuring 39 mm. Venous: The inferior vena cava is normal in size with greater than 50% respiratory variability, suggesting right atrial pressure of 3 mmHg. IAS/Shunts: No atrial level shunt detected by color flow Doppler.  LEFT VENTRICLE PLAX 2D LVIDd:  4.10 cm    Diastology LVIDs:         2.60 cm   LV e' medial:    5.66 cm/s LV PW:         1.30 cm   LV E/e' medial:  18.9 LV IVS:        1.50 cm   LV e' lateral:   4.24 cm/s LVOT diam:     2.10 cm   LV E/e' lateral: 25.2 LV SV:         107 LV SV Index:   40        2D Longitudinal Strain LVOT Area:     3.46 cm  2D Strain GLS (A2C):   -20.6 %                          2D Strain GLS (A3C):   -16.5 %                          2D Strain GLS (A4C):   -16.7 %                          2D Strain GLS Avg:     -17.9 %                           3D Volume EF:                          3D EF:        61 %                          LV EDV:       156 ml                          LV ESV:       61 ml                          LV SV:        95 ml RIGHT VENTRICLE             IVC RV Basal diam:  3.50 cm     IVC diam: 1.40 cm RV Mid diam:    2.50 cm RV S prime:     11.20 cm/s RVSP:           19.5 mmHg LEFT ATRIUM             Index        RIGHT ATRIUM           Index LA diam:        4.30 cm 1.60 cm/m   RA Pressure: 3.00 mmHg LA Vol (A2C):   55.9 ml 20.84 ml/m  RA Area:     14.20 cm LA Vol (A4C):   63.8 ml 23.78 ml/m  RA Volume:   35.00 ml  13.05 ml/m LA Biplane Vol: 59.9 ml 22.33 ml/m  AORTIC VALVE AV Area (Vmax):    1.49 cm AV Area (Vmean):   1.51 cm AV Area (VTI):     1.46 cm AV Vmax:           319.00 cm/s AV Vmean:  204.400 cm/s AV VTI:            0.733 m AV Peak Grad:      40.7 mmHg AV Mean Grad:      23.0 mmHg LVOT Vmax:         137.00 cm/s LVOT Vmean:        89.300 cm/s LVOT VTI:          0.310 m LVOT/AV VTI ratio: 0.42  AORTA Ao Root diam: 2.40 cm Ao Asc diam:  3.90 cm MITRAL VALVE                TRICUSPID VALVE MV Area (PHT): 2.60 cm     TR Peak grad:   16.5 mmHg MV Decel Time: 292 msec     TR Vmax:        203.00 cm/s MV E velocity: 107.00 cm/s  Estimated RAP:  3.00 mmHg MV A velocity: 101.00 cm/s  RVSP:           19.5 mmHg MV E/A ratio:  1.06                             SHUNTS                             Systemic VTI:  0.31 m                              Systemic Diam: 2.10 cm Dalton McleanMD Electronically signed by Wilfred Lacy Signature Date/Time: 12/27/2022/10:16:33 AM    Final     Recent Results (from the past 2160 hours)  ECHOCARDIOGRAM COMPLETE     Status: None   Collection Time: 12/27/22  9:42 AM  Result Value Ref Range   Area-P 1/2 2.60 cm2   S' Lateral 2.60 cm   AV Area mean vel 1.51 cm2   AR max vel 1.49 cm2   AV Area VTI 1.46 cm2   Ao pk vel 3.19 m/s   AV Mean grad 23.0 mmHg   AV Peak grad 40.7 mmHg   Est EF 60 - 65%   POC COVID-19 BinaxNow     Status: None   Collection Time: 01/10/23 11:42 AM  Result Value Ref Range   SARS Coronavirus 2 Ag Negative Negative  POCT Influenza A/B     Status: None   Collection Time: 01/10/23 11:43 AM  Result Value Ref Range   Influenza A, POC Negative Negative   Influenza B, POC Negative Negative  POCT glycosylated hemoglobin (Hb A1C)     Status: Abnormal   Collection Time: 03/17/23 10:52 AM  Result Value Ref Range   Hemoglobin A1C 5.8 (A) 4.0 - 5.6 %   HbA1c POC (<> result, manual entry)     HbA1c, POC (prediabetic range)     HbA1c, POC (controlled diabetic range)          Garner Nash, MD, MS

## 2023-03-17 NOTE — Patient Instructions (Signed)
 VISIT SUMMARY:  Today, we reviewed your blood pressure, diabetes, back pain, and knee pain. Your diabetes is well controlled, and we discussed switching your medication to semaglutide. We also addressed your knee pain, a small abscess on your back, and general health maintenance needs.  YOUR PLAN:  -TYPE 2 DIABETES MELLITUS: Type 2 Diabetes Mellitus is a condition where your body does not use insulin properly, leading to high blood sugar levels. Your diabetes is well-managed with an A1c of 5.8. We will switch your medication from Rybelsus to semaglutide 1 mg once it arrives, and you will continue taking Jardiance 10 mg daily. We will assess how you tolerate and respond to semaglutide in one month.  -OBESITY: Obesity is a condition characterized by excessive body fat. Your BMI is 51.6, and you have lost about 4 pounds since your last visit. Continue your increased physical activity, including walking, and consider swimming as a low-impact exercise option. We will monitor your weight and discuss your progress at the next visit.  -ABSCESS ON BACK: An abscess is a collection of pus that has built up within the tissue of the body. You have a small, tender abscess on your back that was drained today. A wound culture was taken, and you have been prescribed doxycycline 100 mg twice a day. Please follow the care instructions provided and monitor for any signs of infection such as fever, chills, or increased pain.  -BILATERAL KNEE PAIN: Bilateral knee pain means pain in both knees. Your left knee is more affected, but the pain has improved with increased activity. Continue using Blue Emu for relief and avoid NSAIDs like Aleve due to the risk of bleeding with your blood thinner medication. Consider using Voltaren gel if needed for pain relief.  -BACK PAIN: Back pain is discomfort in the back area. Your back pain has improved with increased activity. Continue your physical activity and using Blue Emu for  relief.  INSTRUCTIONS:  Follow up in one month to assess your tolerance and the effectiveness of semaglutide. We will also monitor your weight and discuss your progress, as well as evaluate your knee and back pain management at the next visit.

## 2023-03-18 ENCOUNTER — Other Ambulatory Visit (HOSPITAL_COMMUNITY): Payer: Self-pay

## 2023-03-20 ENCOUNTER — Telehealth: Payer: Self-pay

## 2023-03-20 NOTE — Progress Notes (Signed)
   03/20/2023  Patient ID: Rose Miller, female   DOB: Oct 17, 1955, 68 y.o.   MRN: [id number]  Contacted [employer] patient assistance program to see when patient would be eligible for a 30-day voucher for Ozempic 1 mg based on delivery delays.  I was informed last week by [employer] representative that patient has been approved for 2025 reenrollment for Ozempic 1 mg.  However, today representative states that the company is needing a copy of the patient's Medicare part D card for approval.  Faxing this in to [employer] and will follow up on status next week.  Patient currently has Rybelsus 14 mg on a as she is using from the assistance program.  [name], PharmD, DPLA

## 2023-03-21 LAB — WOUND CULTURE
MICRO NUMBER:: 16150594
RESULT:: NO GROWTH
SPECIMEN QUALITY:: ADEQUATE

## 2023-03-24 ENCOUNTER — Ambulatory Visit (INDEPENDENT_AMBULATORY_CARE_PROVIDER_SITE_OTHER): Payer: HMO | Admitting: Audiology

## 2023-03-24 ENCOUNTER — Other Ambulatory Visit (HOSPITAL_COMMUNITY): Payer: Self-pay

## 2023-03-24 DIAGNOSIS — I129 Hypertensive chronic kidney disease with stage 1 through stage 4 chronic kidney disease, or unspecified chronic kidney disease: Secondary | ICD-10-CM | POA: Diagnosis not present

## 2023-03-24 DIAGNOSIS — E1122 Type 2 diabetes mellitus with diabetic chronic kidney disease: Secondary | ICD-10-CM | POA: Diagnosis not present

## 2023-03-24 DIAGNOSIS — E559 Vitamin D deficiency, unspecified: Secondary | ICD-10-CM | POA: Diagnosis not present

## 2023-03-24 DIAGNOSIS — I350 Nonrheumatic aortic (valve) stenosis: Secondary | ICD-10-CM | POA: Diagnosis not present

## 2023-03-24 DIAGNOSIS — N1832 Chronic kidney disease, stage 3b: Secondary | ICD-10-CM | POA: Diagnosis not present

## 2023-03-25 ENCOUNTER — Ambulatory Visit (INDEPENDENT_AMBULATORY_CARE_PROVIDER_SITE_OTHER): Admitting: Audiology

## 2023-03-25 DIAGNOSIS — Z0110 Encounter for examination of ears and hearing without abnormal findings: Secondary | ICD-10-CM | POA: Diagnosis not present

## 2023-03-25 DIAGNOSIS — H93292 Other abnormal auditory perceptions, left ear: Secondary | ICD-10-CM

## 2023-03-25 NOTE — Progress Notes (Signed)
  [address] [place], [place] [address] [phone]  Audiological Evaluation    Name: Rose Miller     DOB:   [birth year]      MRN:   [id number]                                                                                     Service Date: 03/25/2023     Accompanied by: unaccompanied    Patient comes today after Eyvonne Mechanic, PA-C sent a referral for a hearing evaluation due to concerns with ear fullness.   Symptoms Yes Details  Hearing loss  []    Tinnitus  []    Ear pain/ infections/pressure  [x]  Left ear fullness, sometimes pain  Balance problems  [x]  Sway when walking, pinning sensation that lasts about 1 minute (last one was 2 weeks ago).  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 Left ear:  Clear external ear canals and notable landmarks visualized on the tympanic membrane.  Tympanometry: Right ear: Type A- Normal external ear canal volume with normal middle ear pressure and tympanic membrane compliance Left ear: Type A- Normal external ear canal volume with normal middle ear pressure and tympanic membrane compliance    Pure tone Audiometry: Right ear- Normal hearing from [phone] Hz.  Left ear-  Normal hearing from [phone] Hz.   Speech Audiometry: Right ear- Speech Reception Threshold (SRT) was obtained at 15 dBHL. Left ear-Speech Reception Threshold (SRT) was obtained at 15 dBHL.   Word Recognition Score Tested using NU-6 (MLV) Right ear: 96% was obtained at a presentation level of 55 dBHL with contralateral masking which is deemed as  excellent. Left ear: 96% was obtained at a presentation level of 55 dBHL with contralateral masking which is deemed as  excellent.   The hearing test results were completed under headphones  and results are deemed to be of good reliability. Test technique:  conventional      Recommendations: Follow up with ENT as needed. Return for a hearing evaluation if concerns with hearing changes arise or per MD recommendation.   Rose Miller, AUD

## 2023-03-26 ENCOUNTER — Telehealth: Payer: Self-pay

## 2023-03-26 DIAGNOSIS — R600 Localized edema: Secondary | ICD-10-CM | POA: Diagnosis not present

## 2023-03-26 DIAGNOSIS — B351 Tinea unguium: Secondary | ICD-10-CM | POA: Diagnosis not present

## 2023-03-26 DIAGNOSIS — M2041 Other hammer toe(s) (acquired), right foot: Secondary | ICD-10-CM | POA: Diagnosis not present

## 2023-03-26 DIAGNOSIS — I739 Peripheral vascular disease, unspecified: Secondary | ICD-10-CM | POA: Diagnosis not present

## 2023-03-26 DIAGNOSIS — E1142 Type 2 diabetes mellitus with diabetic polyneuropathy: Secondary | ICD-10-CM | POA: Diagnosis not present

## 2023-03-26 DIAGNOSIS — M792 Neuralgia and neuritis, unspecified: Secondary | ICD-10-CM | POA: Diagnosis not present

## 2023-03-26 NOTE — Progress Notes (Signed)
   03/26/2023  Patient ID: Rose Miller, female   DOB: 1955/05/26, 68 y.o.   MRN: [id number]  Contacted [employer] to follow-up on patient's re-enrollment application and order change to Ozempic 1mg  weekly.  Approved 04/11/23-01/14/24.  Four pens of Ozempic 1mg  should arrive at Dr. Carollee Massed office by 4/17.  Sending patient a MyChart message to make her aware and verify she has enough Rybelsus 14mg  to last until then.  Will also schedule a telephone visit for approximately 4 weeks after she will switch to Ozempic 1mg .  [name], PharmD, DPLA

## 2023-03-27 ENCOUNTER — Encounter: Payer: Self-pay | Admitting: Audiology

## 2023-03-28 ENCOUNTER — Ambulatory Visit: Payer: HMO

## 2023-03-28 DIAGNOSIS — Z0000 Encounter for general adult medical examination without abnormal findings: Secondary | ICD-10-CM

## 2023-03-28 DIAGNOSIS — R3121 Asymptomatic microscopic hematuria: Secondary | ICD-10-CM | POA: Diagnosis not present

## 2023-03-28 DIAGNOSIS — N281 Cyst of kidney, acquired: Secondary | ICD-10-CM | POA: Diagnosis not present

## 2023-03-28 NOTE — Progress Notes (Signed)
 Subjective:   Rose Miller is a 68 y.o. who presents for a Medicare Wellness preventive visit.  Visit Complete: Virtual I connected with  Rose Miller on 03/28/23 by a audio enabled telemedicine application and verified that I am speaking with the correct person using two identifiers.  Patient Location: Home  Provider Location: Office/Clinic  I discussed the limitations of evaluation and management by telemedicine. The patient expressed understanding and agreed to proceed.  Vital Signs: Because this visit was a virtual/telehealth visit, some criteria may be missing or patient reported. Any vitals not documented were not able to be obtained and vitals that have been documented are patient reported.  VideoError- Librarian, academic were attempted between this provider and patient, however failed, due to patient having technical difficulties OR patient did not have access to video capability.  We continued and completed visit with audio only.   Persons Participating in Visit: n/a  AWV Questionnaire: Yes: Patient Medicare AWV questionnaire was completed by the patient on 03/23/2023; I have confirmed that all information answered by patient is correct and no changes since this date.  Cardiac Risk Factors include: diabetes mellitus;dyslipidemia;hypertension;advanced age (>74men, >61 women)     Objective:    Today's Vitals   03/28/23 1336  PainSc: 3    There is no height or weight on file to calculate BMI.     03/28/2023    1:44 PM 03/25/2022    2:02 PM 05/01/2021    6:29 AM 03/20/2021    1:00 PM 12/26/2020    7:25 AM 12/22/2020    9:25 AM 11/24/2020   11:31 AM  Advanced Directives  Does Patient Have a Medical Advance Directive? No No No Yes No No No  Type of Theme park manager;Living will     Copy of Healthcare Power of Attorney in Chart?    No - copy requested     Would patient like information on creating a medical  advance directive?   No - Patient declined  No - Patient declined No - Patient declined No - Patient declined    Current Medications (verified) Outpatient Encounter Medications as of 03/28/2023  Medication Sig   amLODipine (NORVASC) 10 MG tablet Take 1 tablet (10 mg total) by mouth daily.   amoxicillin (AMOXIL) 500 MG tablet TAKE 4 TABLETS 1 HOUR PRIORTO DENTAL PROCEDURES AND   CLEANINGS   apixaban (ELIQUIS) 5 MG TABS tablet Take 1 tablet (5 mg total) by mouth 2 (two) times daily.   Ascorbic Acid (VITAMIN C) 1000 MG tablet Take 1,000 mg by mouth daily.   atorvastatin (LIPITOR) 10 MG tablet Take 1 tablet (10 mg total) by mouth daily.   azelastine (ASTELIN) 0.1 % nasal spray Place 2 sprays into both nostrils 2 (two) times daily. Use in each nostril as directed   Blood Glucose Monitoring Suppl (ONE TOUCH ULTRA 2) w/Device KIT Use as directed in the morning, at noon, and at bedtime.   Cholecalciferol 50 MCG (2000 UT) TABS Take 2,000 Units by mouth daily.   Coenzyme Q10 (CO Q-10) 100 MG CAPS Take 100 mg by mouth daily.    Cyanocobalamin (B-12) 500 MCG TABS Take 500 mcg by mouth daily.   diltiazem (CARDIZEM) 30 MG tablet Take 1 tablet (30 mg total) by mouth every 6 (six) hours as needed (fast heart rate, heart palpitations).   empagliflozin (JARDIANCE) 10 MG TABS tablet Take 1 tablet (10 mg total) by mouth daily before  breakfast.   fluticasone (FLONASE) 50 MCG/ACT nasal spray Place 2 sprays into both nostrils daily.   furosemide (LASIX) 20 MG tablet Take 1 tablet (20 mg total) by mouth daily as needed (for lower extremity swelling).   hydrALAZINE (APRESOLINE) 50 MG tablet Take 1 tablet (50 mg total) by mouth 3 (three) times daily.   ipratropium (ATROVENT) 0.06 % nasal spray Place 2 sprays into both nostrils 4 (four) times daily.   losartan (COZAAR) 100 MG tablet Take 1 tablet (100 mg total) by mouth daily. Discontinue losartan/hctz   Menthol, Topical Analgesic, (BLUE-EMU MAXIMUM STRENGTH) 2.5 % LIQD  Apply 1 application topically daily as needed (pain).   metoprolol succinate (TOPROL-XL) 25 MG 24 hr tablet Take 1 tablet (25 mg total) by mouth daily.   montelukast (SINGULAIR) 10 MG tablet TAKE 1 TABLET BY MOUTH AT BEDTIME   mupirocin ointment (BACTROBAN) 2 % Apply 1 Application topically 2 (two) times daily for 14 days   Olopatadine HCl 0.2 % SOLN Apply 1 drop to eye in the morning and at bedtime.   omeprazole (PRILOSEC) 20 MG capsule Take 1 capsule (20 mg total) by mouth daily.   Semaglutide, 1 MG/DOSE, (OZEMPIC, 1 MG/DOSE,) 4 MG/3ML SOPN Inject 1 mg into the skin once a week.   spironolactone (ALDACTONE) 25 MG tablet Take 1 tablet (25 mg total) by mouth daily.   Tiotropium Bromide-Olodaterol (STIOLTO RESPIMAT) 2.5-2.5 MCG/ACT AERS Inhale 2 puffs into the lungs daily.   Zinc 50 MG TABS Take 50 mg by mouth daily.   No facility-administered encounter medications on file as of 03/28/2023.    Allergies (verified) Patient has no known allergies.   History: Past Medical History:  Diagnosis Date   Adhesive capsulitis    Aortic valve disease    Arthritis    Asthma    Back pain    CKD (chronic kidney disease) stage 3, GFR 30-59 ml/min (HCC)    Constipation    COPD (chronic obstructive pulmonary disease) (HCC)    Diabetes mellitus without complication (HCC)    Diuretic-induced hypokalemia    Hypercholesterolemia    Hypertension    IT band syndrome    Morbid obesity (HCC)    Palpitations    Periumbilical pain    Postmenopausal estrogen deficiency    Prediabetes    S/P TAVR (transcatheter aortic valve replacement) 12/26/2020   Ventral hernia    Past Surgical History:  Procedure Laterality Date   BIOPSY  05/01/2021   Procedure: BIOPSY;  Surgeon: Lynann Bologna, MD;  Location: [facility];  Service: Gastroenterology;;   CESAREAN SECTION  1989   COLONOSCOPY     COLONOSCOPY WITH PROPOFOL N/A 05/01/2021   Procedure: COLONOSCOPY WITH PROPOFOL;  Surgeon: Lynann Bologna, MD;  Location: [facility]  END[facility]COPY;  Service: Gastroenterology;  Laterality: N/A;   ESOPHAGOGASTRODUODENOSCOPY (EGD) WITH PROPOFOL N/A 05/01/2021   Procedure: ESOPHAGOGASTRODUODENOSCOPY (EGD) WITH PROPOFOL;  Surgeon: Lynann Bologna, MD;  Location: [facility];  Service: Gastroenterology;  Laterality: N/A;   INTRAOPERATIVE TRANSTHORACIC ECHOCARDIOGRAM N/A 12/26/2020   Procedure: INTRAOPERATIVE TRANSTHORACIC ECHOCARDIOGRAM;  Surgeon: Orbie Pyo, MD;  Location: [facility];  Service: Open Heart Surgery;  Laterality: N/A;   MALONEY DILATION  05/01/2021   Procedure: Elease Hashimoto DILATION;  Surgeon: Lynann Bologna, MD;  Location: [facility];  Service: Gastroenterology;;   POLYPECTOMY  05/01/2021   Procedure: POLYPECTOMY;  Surgeon: Lynann Bologna, MD;  Location: [facility];  Service: Gastroenterology;;   RIGHT/LEFT HEART CATH AND CORONARY ANGIOGRAPHY N/A 11/24/2020   Procedure: RIGHT/LEFT HEART  CATH AND CORONARY ANGIOGRAPHY;  Surgeon: Lyn Records, MD;  Location: [facility]AS[facility] CV LAB;  Service: Cardiovascular;  Laterality: N/A;   TONSILLECTOMY AND ADENOIDECTOMY  1960   TRANSCATHETER AORTIC VALVE REPLACEMENT, TRANSFEMORAL N/A 12/26/2020   Procedure: TRANSCATHETER AORTIC VALVE REPLACEMENT, TRANSFEMORAL;  Surgeon: Orbie Pyo, MD;  Location: [facility] CV [facility];  Service: Open Heart Surgery;  Laterality: N/A;   Family History  Problem Relation Age of Onset   Dementia Mother    Arthritis Mother    Cancer Mother        pancreatic   Hypertension Mother    Obesity Mother    Cancer Father        prostate and colon   Colon cancer Father    Heart disease Father    Thyroid disease Father    Breast cancer Neg Hx    Social History   Socioeconomic History   Marital status: Single    Spouse name: Not on file   Number of children: 1   Years of education: Not on file   Highest education level: Not on file  Occupational History   Occupation: retired  Tobacco Use   Smoking status: Former    Current packs/day: 0.00     Average packs/day: 1 pack/day for 38.0 years (38.0 ttl pk-yrs)    Types: Cigarettes    Start date: 55    Quit date: 2013    Years since quitting: 12.2    Passive exposure: Never   Smokeless tobacco: Never  Vaping Use   Vaping status: Never Used  Substance and Sexual Activity   Alcohol use: Yes    Comment: socially   Drug use: No   Sexual activity: Not on file  Other Topics Concern   Not on file  Social History Narrative   Not on file   Social Drivers of Health   Financial Resource Strain: Low Risk  (03/28/2023)   Overall Financial Resource Strain (CARDIA)    Difficulty of Paying Living Expenses: Not hard at all  Food Insecurity: No Food Insecurity (03/28/2023)   Hunger Vital Sign    Worried About Running Out of Food in the Last Year: Never true    Ran Out of Food in the Last Year: Never true  Transportation Needs: No Transportation Needs (03/28/2023)   PRAPARE - Administrator, Civil Service (Medical): No    Lack of Transportation (Non-Medical): No  Physical Activity: Inactive (03/28/2023)   Exercise Vital Sign    Days of Exercise per Week: 0 days    Minutes of Exercise per Session: 0 min  Stress: No Stress Concern Present (03/28/2023)   [employer] of Occupational Health - Occupational Stress Questionnaire    Feeling of Stress : Not at all  Social Connections: Socially Isolated (03/28/2023)   Social Connection and Isolation Panel [NHANES]    Frequency of Communication with Friends and Family: More than three times a week    Frequency of Social Gatherings with Friends and Family: Patient declined    Attends Religious Services: Never    Database administrator or Organizations: No    Attends Engineer, structural: Never    Marital Status: Never married    Tobacco Counseling Counseling given: Not Answered    Clinical Intake:  Pre-visit preparation completed: Yes  Pain : 0-10 Pain Score: 3  Pain Type: Chronic pain Pain Location: Knee  (lower back) Pain Orientation: Left, Right Pain Descriptors / Indicators: Aching Pain Onset: More than  a month ago Pain Frequency: Constant     Nutritional Risks: None Diabetes: Yes CBG done?: No Did pt. bring in CBG monitor from home?: No  How often do you need to have someone help you when you read instructions, pamphlets, or other written materials from your doctor or pharmacy?: 1 - Never  Interpreter Needed?: No  Information entered by :: [place] LPN   Activities of Daily Living     03/23/2023    3:19 AM  In your present state of health, do you have any difficulty performing the following activities:  Hearing? 0  Vision? 0  Difficulty concentrating or making decisions? 0  Walking or climbing stairs? 1  Dressing or bathing? 0  Doing errands, shopping? 0  Preparing Food and eating ? N  Using the Toilet? N  In the past six months, have you accidently leaked urine? N  Do you have problems with loss of bowel control? N  Managing your Medications? N  Managing your Finances? N  Housekeeping or managing your Housekeeping? N    Patient Care Team: Garnette Gunner, MD as PCP - General (Family Medicine) Lyn Records, MD (Inactive) as PCP - Cardiology (Cardiology)  Indicate any recent Medical Services you may have received from other than [facility] providers in the past year (date may be approximate).     Assessment:   This is a routine wellness examination for [place].  Hearing/Vision screen Hearing Screening - Comments:: Denies hearing issues Vision Screening - Comments:: Regular eye exams, [facility]   Goals Addressed             This Visit's Progress    Patient Stated       03/28/2023, try to walk more       Depression Screen     03/28/2023    1:46 PM 12/23/2022    1:22 PM 09/23/2022    2:52 PM 03/25/2022    2:03 PM 03/14/2022   11:03 AM 09/12/2021   11:17 AM 06/12/2021   11:13 AM  PHQ 2/9 Scores  PHQ - 2 Score 0 0 0 0 0 0 0  PHQ- 9 Score 0    2       Fall Risk     03/23/2023    3:19 AM 03/12/2023    2:04 PM 12/23/2022    1:25 PM 03/21/2022    9:38 AM 09/12/2021   11:17 AM  Fall Risk   Falls in the past year? 0 0 0 0 0  Number falls in past yr: 0   0 0  Injury with Fall? 0   0   Risk for fall due to : Medication side effect   Medication side effect   Follow up Falls prevention discussed;Falls evaluation completed   Falls prevention discussed;Education provided;Falls evaluation completed     MEDICARE RISK AT HOME:  Medicare Risk at Home Any stairs in or around the home?: (Patient-Rptd) Yes If so, are there any without handrails?: (Patient-Rptd) No Home free of loose throw rugs in walkways, pet beds, electrical cords, etc?: (Patient-Rptd) Yes Adequate lighting in your home to reduce risk of falls?: (Patient-Rptd) Yes Life alert?: (Patient-Rptd) No Use of a cane, walker or w/c?: (Patient-Rptd) No Grab bars in the bathroom?: (Patient-Rptd) No Shower chair or bench in shower?: (Patient-Rptd) Yes Elevated toilet seat or a handicapped toilet?: (Patient-Rptd) No  TIMED UP AND GO:  Was the test performed?  No  Cognitive Function: 6CIT completed  03/28/2023    1:48 PM 03/25/2022    2:04 PM  6CIT Screen  What Year? 0 points 0 points  What month? 0 points 0 points  What time? 0 points 0 points  Count back from 20 0 points 0 points  Months in reverse 0 points 0 points  Repeat phrase 2 points 2 points  Total Score 2 points 2 points    Immunizations Immunization History  Administered Date(s) Administered   Influenza,inj,Quad PF,6+ Mos 12/19/2014   PNEUMOCOCCAL CONJUGATE-20 06/12/2022   Tdap 12/19/2014, 12/19/2014   Zoster Recombinant(Shingrix) 06/12/2021, 09/12/2021    Screening Tests Health Maintenance  Topic Date Due   INFLUENZA VACCINE  04/14/2023 (Originally 08/15/2022)   OPHTHALMOLOGY EXAM  05/21/2023   Lung Cancer Screening  07/17/2023   HEMOGLOBIN A1C  09/17/2023   FOOT EXAM  10/16/2023   MAMMOGRAM   12/09/2023   Diabetic kidney evaluation - eGFR measurement  12/16/2023   Diabetic kidney evaluation - Urine ACR  12/16/2023   Medicare Annual Wellness (AWV)  03/27/2024   DTaP/Tdap/Td (3 - Td or Tdap) 12/18/2024   Colonoscopy  05/02/2026   Pneumonia Vaccine 14+ Years old  Completed   DEXA SCAN  Completed   Hepatitis C Screening  Completed   Zoster Vaccines- Shingrix  Completed   HPV VACCINES  Aged Out   COVID-19 Vaccine  Discontinued    Health Maintenance  There are no preventive care reminders to display for this patient. Health Maintenance Items Addressed: Up to date  Additional Screening:  Vision Screening: Recommended annual ophthalmology exams for early detection of glaucoma and other disorders of the eye.  Dental Screening: Recommended annual dental exams for proper oral hygiene  Community Resource Referral / Chronic Care Management: CRR required this visit?  No   CCM required this visit?  No     Plan:     I have personally reviewed and noted the following in the patient's chart:   Medical and social history Use of alcohol, tobacco or illicit drugs  Current medications and supplements including opioid prescriptions. Patient is not currently taking opioid prescriptions. Functional ability and status Nutritional status Physical activity Advanced directives List of other physicians Hospitalizations, surgeries, and ER visits in previous 12 months Vitals Screenings to include cognitive, depression, and falls Referrals and appointments  In addition, I have reviewed and discussed with patient certain preventive protocols, quality metrics, and best practice recommendations. A written personalized care plan for preventive services as well as general preventive health recommendations were provided to patient.     Barb Merino, LPN   1/61/0960   After Visit Summary: (MyChart) Due to this being a telephonic visit, the after visit summary with patients  personalized plan was offered to patient via MyChart   Notes: Nothing significant to report at this time.

## 2023-03-28 NOTE — Patient Instructions (Signed)
 Rose Miller , Thank you for taking time to come for your Medicare Wellness Visit. I appreciate your ongoing commitment to your health goals. Please review the following plan we discussed and let me know if I can assist you in the future.   Referrals/Orders/Follow-Ups/Clinician Recommendations: none  This is a list of the screening recommended for you and due dates:  Health Maintenance  Topic Date Due   Flu Shot  04/14/2023*   Eye exam for diabetics  05/21/2023   Screening for Lung Cancer  07/17/2023   Hemoglobin A1C  09/17/2023   Complete foot exam   10/16/2023   Mammogram  12/09/2023   Yearly kidney function blood test for diabetes  12/16/2023   Yearly kidney health urinalysis for diabetes  12/16/2023   Medicare Annual Wellness Visit  03/27/2024   DTaP/Tdap/Td vaccine (3 - Td or Tdap) 12/18/2024   Colon Cancer Screening  05/02/2026   Pneumonia Vaccine  Completed   DEXA scan (bone density measurement)  Completed   Hepatitis C Screening  Completed   Zoster (Shingles) Vaccine  Completed   HPV Vaccine  Aged Out   COVID-19 Vaccine  Discontinued  *Topic was postponed. The date shown is not the original due date.    Advanced directives: (ACP Link)Information on Advanced Care Planning can be found at [facility] Advance Health Care Directives Advance Health Care Directives. [email or web address]   Next Medicare Annual Wellness Visit scheduled for next year: Yes  insert Preventive Care attachment Insert FALL PREVENTION attachment if needed

## 2023-03-30 ENCOUNTER — Other Ambulatory Visit: Payer: Self-pay

## 2023-04-01 ENCOUNTER — Other Ambulatory Visit (HOSPITAL_COMMUNITY): Payer: Self-pay

## 2023-04-01 ENCOUNTER — Other Ambulatory Visit: Payer: Self-pay | Admitting: Cardiovascular Disease

## 2023-04-01 ENCOUNTER — Other Ambulatory Visit: Payer: Self-pay

## 2023-04-01 DIAGNOSIS — I10 Essential (primary) hypertension: Secondary | ICD-10-CM

## 2023-04-02 ENCOUNTER — Other Ambulatory Visit: Payer: Self-pay | Admitting: Adult Health

## 2023-04-02 ENCOUNTER — Other Ambulatory Visit (HOSPITAL_COMMUNITY): Payer: Self-pay

## 2023-04-02 DIAGNOSIS — R93429 Abnormal radiologic findings on diagnostic imaging of unspecified kidney: Secondary | ICD-10-CM

## 2023-04-02 MED ORDER — LOSARTAN POTASSIUM 100 MG PO TABS
100.0000 mg | ORAL_TABLET | Freq: Every day | ORAL | 3 refills | Status: DC
  Filled 2023-04-02: qty 90, 90d supply, fill #0
  Filled 2023-06-30: qty 90, 90d supply, fill #1
  Filled 2023-09-28: qty 90, 90d supply, fill #2

## 2023-04-03 ENCOUNTER — Other Ambulatory Visit: Payer: Self-pay

## 2023-04-07 ENCOUNTER — Ambulatory Visit (HOSPITAL_COMMUNITY): Payer: HMO | Attending: Cardiology

## 2023-04-07 DIAGNOSIS — Z952 Presence of prosthetic heart valve: Secondary | ICD-10-CM | POA: Insufficient documentation

## 2023-04-07 LAB — ECHOCARDIOGRAM COMPLETE
AV Mean grad: 12.8 mmHg
AV Peak grad: 23.1 mmHg
Ao pk vel: 2.4 m/s
Area-P 1/2: 2.93 cm2
S' Lateral: 2.9 cm

## 2023-04-08 ENCOUNTER — Encounter (HOSPITAL_BASED_OUTPATIENT_CLINIC_OR_DEPARTMENT_OTHER): Payer: HMO

## 2023-04-10 ENCOUNTER — Ambulatory Visit (HOSPITAL_BASED_OUTPATIENT_CLINIC_OR_DEPARTMENT_OTHER): Payer: HMO

## 2023-04-10 ENCOUNTER — Encounter (HOSPITAL_BASED_OUTPATIENT_CLINIC_OR_DEPARTMENT_OTHER): Payer: Self-pay | Admitting: Cardiovascular Disease

## 2023-04-10 ENCOUNTER — Ambulatory Visit (HOSPITAL_BASED_OUTPATIENT_CLINIC_OR_DEPARTMENT_OTHER): Payer: HMO | Admitting: Cardiovascular Disease

## 2023-04-10 VITALS — BP 132/80 | HR 79 | Ht 70.0 in | Wt 362.6 lb

## 2023-04-10 DIAGNOSIS — I2510 Atherosclerotic heart disease of native coronary artery without angina pectoris: Secondary | ICD-10-CM

## 2023-04-10 DIAGNOSIS — I359 Nonrheumatic aortic valve disorder, unspecified: Secondary | ICD-10-CM | POA: Diagnosis not present

## 2023-04-10 DIAGNOSIS — E1122 Type 2 diabetes mellitus with diabetic chronic kidney disease: Secondary | ICD-10-CM

## 2023-04-10 DIAGNOSIS — E1159 Type 2 diabetes mellitus with other circulatory complications: Secondary | ICD-10-CM

## 2023-04-10 DIAGNOSIS — I480 Paroxysmal atrial fibrillation: Secondary | ICD-10-CM

## 2023-04-10 DIAGNOSIS — I152 Hypertension secondary to endocrine disorders: Secondary | ICD-10-CM | POA: Diagnosis not present

## 2023-04-10 DIAGNOSIS — R0602 Shortness of breath: Secondary | ICD-10-CM | POA: Diagnosis not present

## 2023-04-10 DIAGNOSIS — N183 Chronic kidney disease, stage 3 unspecified: Secondary | ICD-10-CM

## 2023-04-10 DIAGNOSIS — J431 Panlobular emphysema: Secondary | ICD-10-CM

## 2023-04-10 DIAGNOSIS — I700 Atherosclerosis of aorta: Secondary | ICD-10-CM

## 2023-04-10 DIAGNOSIS — I2584 Coronary atherosclerosis due to calcified coronary lesion: Secondary | ICD-10-CM

## 2023-04-10 LAB — PULMONARY FUNCTION TEST
DL/VA % pred: 123 %
DL/VA: 4.92 ml/min/mmHg/L
DLCO cor % pred: 93 %
DLCO cor: 22.31 ml/min/mmHg
DLCO unc % pred: 93 %
DLCO unc: 22.31 ml/min/mmHg
FEF 25-75 Post: 3.31 L/s
FEF 25-75 Pre: 3.07 L/s
FEF2575-%Change-Post: 7 %
FEF2575-%Pred-Post: 143 %
FEF2575-%Pred-Pre: 133 %
FEV1-%Change-Post: 0 %
FEV1-%Pred-Post: 84 %
FEV1-%Pred-Pre: 84 %
FEV1-Post: 2.44 L
FEV1-Pre: 2.44 L
FEV1FVC-%Change-Post: 2 %
FEV1FVC-%Pred-Pre: 113 %
FEV6-%Change-Post: -2 %
FEV6-%Pred-Post: 75 %
FEV6-%Pred-Pre: 77 %
FEV6-Post: 2.77 L
FEV6-Pre: 2.83 L
FEV6FVC-%Pred-Post: 104 %
FEV6FVC-%Pred-Pre: 104 %
FVC-%Change-Post: -2 %
FVC-%Pred-Post: 73 %
FVC-%Pred-Pre: 74 %
FVC-Post: 2.77 L
FVC-Pre: 2.83 L
Post FEV1/FVC ratio: 88 %
Post FEV6/FVC ratio: 100 %
Pre FEV1/FVC ratio: 86 %
Pre FEV6/FVC Ratio: 100 %
RV % pred: 84 %
RV: 2.07 L
TLC % pred: 83 %
TLC: 4.99 L

## 2023-04-10 NOTE — Progress Notes (Signed)
 Full PFT Performed Today

## 2023-04-10 NOTE — Patient Instructions (Signed)
 Full PFT Performed Today

## 2023-04-10 NOTE — Patient Instructions (Signed)
 Medication Instructions:  Your physician recommends that you continue on your current medications as directed. Please refer to the Current Medication list given to you today.   *If you need a refill on your cardiac medications before your next appointment, please call your pharmacy*  Lab Work: NONE  Testing/Procedures: NONE  Follow-Up: At [facility], you and your health needs are our priority.  As part of our continuing mission to provide you with exceptional heart care, we have created designated Provider Care Teams.  These Care Teams include your primary Cardiologist (physician) and Advanced Practice Providers (APPs -  Physician Assistants and Nurse Practitioners) who all work together to provide you with the care you need, when you need it.  We recommend signing up for the patient portal called "MyChart".  Sign up information is provided on this After Visit Summary.  MyChart is used to connect with patients for Virtual Visits (Telemedicine).  Patients are able to view lab/test results, encounter notes, upcoming appointments, etc.  Non-urgent messages can be sent to your provider as well.   To learn more about what you can do with MyChart, go to [email or web address].    Your next appointment:   4 month(s)  Provider:   Chilton Si, MD, Eligha Bridegroom, NP, or Gillian Shields, NP    You have been referred to PREP  Other Instructions COUNT YOUR STEPS EVERY NIGHT. AT THE END OF THE WEEK TOTAL AND GET AN AVERAGE OF DAILY STEPS. NEXT WEEK INCREASE STEPS BY 10%.   COOK AT HOME AT LEAST 2 NIGHTS A WEEK

## 2023-04-10 NOTE — Progress Notes (Signed)
 Cardiology Office Note:  .   Date:  04/11/2023  ID:  Rose Miller, Rose Miller [birth year], MRN [id number] PCP: Chilton Si, MD  [facility] Providers Cardiologist:  Lesleigh Noe, MD (Inactive)    History of Present Illness: .    Rose Miller is a 68 y.o. female with aortic stenosis status post TAVR (12/2020), mild nonobstructive CAD, PAF, hypertension, hyperlipidemia, COPD, and morbid obesity (BMI 51) here for follow-up.  She was previously a patient of Dr. Katrinka Blazing.  Echo in 2022 revealed LVEF 70% with moderate to severe aortic stenosis.  She underwent cardiac catheterization was found to have mild nonobstructive CAD.  She proceeded to have a 26 mm Edwards SAPIEN 3 ultra Resilia TAVR valve implanted 12/2020.  Postoperatively echo revealed LVEF 70% with a normally functioning TAVR valve and a mean gradient of 17.7 mmHg.  She developed atrial fibrillation 12/2020.  She was started on Eliquis and given diltiazem to take as needed.  After her TAVR valve she had episodes of bradycardia and metoprolol had been discontinued.  She saw Laurance Flatten, MD 03/2022.  At the time she was feeling well.  She followed up with structural heart 12/2022 and they were working to help her get on [facility] as she had previously failed Ozempic and Rybelsus.  03/2023 revealed LVEF 65-70% with mean gradient of 13 mmHg across bioprosthetic aortic valve.  There is no aortic regurgitation.  Discussed the use of AI scribe software for clinical note transcription with the patient, who gave verbal consent to proceed.  History of Present Illness Rose Miller is a 68 year old female with hypertension and atrial fibrillation who presents for a cardiology follow-up. She is accompanied by her son.  Blood pressure is well-controlled at home with an average reading of 127/70 mmHg. She is currently on amlodipine, diltiazem as needed for atrial fibrillation, hydralazine, losartan, and metoprolol. She monitors her  blood pressure daily and has not noticed any significant spikes.  She does not feel her atrial fibrillation when it occurs and has not noticed any irregular heartbeats. She takes diltiazem as needed for atrial fibrillation but hasn't needed to take it.  She experiences swelling in her ankles, which improves overnight. No shortness of breath when lying down. She takes Lasix as needed for swelling, but her kidney doctor advised minimizing its use and suggested compression socks instead.  She has not been engaging in regular exercise due to pain in her back and knees. She believes that exercising exacerbates her pain, stating, 'If I exercise, I'm gonna be hurting.' Her son suggests that she lacks motivation and may be creating mental barriers to exercise. She has been trying to walk more than usual but has not established a regular exercise routine.  Her diet primarily consists of takeout meals, although she tries to watch her salt intake. She lives with her son and his girlfriend, who occasionally cook at home. She has been on Rybelsus for about a year for diabetes management.  ROS:  As per HPI  Studies Reviewed: [name] Kitchen   EKG Interpretation Date/Time:  Thursday April 10 2023 11:01:42 EDT Ventricular Rate:  79 PR Interval:  166 QRS Duration:  96 QT Interval:  400 QTC Calculation: 458 R Axis:   50  Text Interpretation: Normal sinus rhythm Septal infarct , age undetermined Since last tracing Cannot rule out Septal injury pattern Confirmed by Chilton Si ([id number]) on 04/10/2023 11:08:36 AM   Echo 04/07/23:   1. Left ventricular ejection fraction, by  estimation, is 65 to 70%. The  left ventricle has normal function. The left ventricle has no regional  wall motion abnormalities. There is moderate concentric left ventricular  hypertrophy. Left ventricular  diastolic parameters are indeterminate.   2. Right ventricular systolic function is normal. The right ventricular  size is normal. Tricuspid  regurgitation signal is inadequate for assessing  PA pressure.   3. Left atrial size was mildly dilated.   4. The mitral valve is normal in structure. Trivial mitral valve  regurgitation. No evidence of mitral stenosis.   5. The aortic valve has been repaired/replaced. Aortic valve  regurgitation is not visualized. There is a 26 mm Sapien prosthetic (TAVR)  valve present in the aortic position. Procedure Date: 12/26/2020. Echo  findings are consistent with normal structure  and function of the aortic valve prosthesis. Aortic valve mean gradient  measures 12.8 mmHg.   6. Aortic dilatation noted. There is borderline dilatation of the  ascending aorta, measuring 39 mm.   7. The inferior vena cava is normal in size with greater than 50%  respiratory variability, suggesting right atrial pressure of 3 mmHg.    Risk Assessment/Calculations:    CHA2DS2-VASc Score = 5   This indicates a 7.2% annual risk of stroke. The patient's score is based upon: CHF History: 1 HTN History: 1 Diabetes History: 1 Stroke History: 0 Vascular Disease History: 0 Age Score: 1 Gender Score: 1            Physical Exam:   VS:  BP 132/80   Pulse 79   Ht 5\' 10"  (1.778 m)   Wt (!) 362 lb 9.6 oz (164.5 kg)   SpO2 96%   BMI 52.03 kg/m  , BMI Body mass index is 52.03 kg/m. GENERAL:  Well appearing HEENT: Pupils equal round and reactive, fundi not visualized, oral mucosa unremarkable NECK:  No jugular venous distention, waveform within normal limits, carotid upstroke brisk and symmetric, no bruits, no thyromegaly LUNGS:  Clear to auscultation bilaterally HEART:  RRR.  PMI not displaced or sustained,S1 and S2 within normal limits, no S3, no S4, no clicks, no rubs, II/VI systolic murmur at the LUSB ABD:  Flat, positive bowel sounds normal in frequency in pitch, no bruits, no rebound, no guarding, no midline pulsatile mass, no hepatomegaly, no splenomegaly EXT:  2 plus pulses throughout, no edema, no cyanosis  no clubbing SKIN:  No rashes no nodules NEURO:  Cranial nerves II through XII grossly intact, motor grossly intact throughout PSYCH:  Cognitively intact, oriented to person place and time   ASSESSMENT AND PLAN: .    Assessment & Plan # Hypertension Blood pressure well-controlled at 127/70 mmHg. No recent atrial fibrillation or irregular heart rate.  Continue amlodipine, hydralazine, losartan, metoprolol and spirolactone.  # Venous insufficiency: Ankle swelling resolves by morning, suggesting venous insufficiency. Echocardiogram supports normal heart function. - Use compression stockings to manage swelling.  # Aortic stenosis s/p TAVR: Echocardiogram showed normal heart function with mild trivial valve leakage.  Valve stable on echo 03/2022. Mean gradient 12.8 mmHg.  # Non-obstructive CAD: # Aortic atherosclerosis: # Hyperlipidemia LDL cholesterol at 66 mg/dL, indicating good control on low-dose atorvastatin.  She is not on aspirin given that she is Eliquis.  # Morbid obesity and exercise intolerance Difficulty exercising due to back and knee pain, contributing to inactivity and weight gain. Discussed starting with small exercise goals and benefits of increased activity. - Track daily steps for a week and aim for a 10% increase  in steps weekly. - Enroll in the [facility] PREP program for exercise and dietary guidance.  # Dietary habits Primarily orders out, potentially increasing sodium intake and fluid retention. Discussed importance of cooking at home to control salt intake. - Consider cooking at home at least two nights a week to reduce sodium intake.    Dispo: f/u in 4 months  Signed, Chilton Si, MD

## 2023-04-14 ENCOUNTER — Other Ambulatory Visit (HOSPITAL_COMMUNITY): Payer: Self-pay

## 2023-04-14 ENCOUNTER — Telehealth: Payer: Self-pay

## 2023-04-14 ENCOUNTER — Ambulatory Visit (INDEPENDENT_AMBULATORY_CARE_PROVIDER_SITE_OTHER): Admitting: Family Medicine

## 2023-04-14 VITALS — BP 138/84 | HR 74 | Temp 97.5°F | Wt 360.0 lb

## 2023-04-14 DIAGNOSIS — I2584 Coronary atherosclerosis due to calcified coronary lesion: Secondary | ICD-10-CM | POA: Diagnosis not present

## 2023-04-14 DIAGNOSIS — Z6841 Body Mass Index (BMI) 40.0 and over, adult: Secondary | ICD-10-CM | POA: Diagnosis not present

## 2023-04-14 DIAGNOSIS — E1169 Type 2 diabetes mellitus with other specified complication: Secondary | ICD-10-CM | POA: Diagnosis not present

## 2023-04-14 DIAGNOSIS — E1159 Type 2 diabetes mellitus with other circulatory complications: Secondary | ICD-10-CM | POA: Diagnosis not present

## 2023-04-14 DIAGNOSIS — E66813 Obesity, class 3: Secondary | ICD-10-CM

## 2023-04-14 DIAGNOSIS — I700 Atherosclerosis of aorta: Secondary | ICD-10-CM | POA: Diagnosis not present

## 2023-04-14 DIAGNOSIS — I2510 Atherosclerotic heart disease of native coronary artery without angina pectoris: Secondary | ICD-10-CM | POA: Diagnosis not present

## 2023-04-14 DIAGNOSIS — I480 Paroxysmal atrial fibrillation: Secondary | ICD-10-CM | POA: Diagnosis not present

## 2023-04-14 DIAGNOSIS — N289 Disorder of kidney and ureter, unspecified: Secondary | ICD-10-CM | POA: Insufficient documentation

## 2023-04-14 DIAGNOSIS — J431 Panlobular emphysema: Secondary | ICD-10-CM | POA: Diagnosis not present

## 2023-04-14 DIAGNOSIS — I152 Hypertension secondary to endocrine disorders: Secondary | ICD-10-CM | POA: Diagnosis not present

## 2023-04-14 NOTE — Telephone Encounter (Signed)
 Called to discuss PREP program referral; explained PREP she would like to attend May class, every M/W at 12:30; will contact end of April to confirm start date and set up assessment visit.

## 2023-04-14 NOTE — Progress Notes (Signed)
 Assessment/Plan:    Assessment & Plan Obesity BMI is 51 with a slight weight increase from 359 to 360 pounds. She believes weight loss will improve her shortness of breath and osteoarthritis symptoms. Discussed potential benefits of weight loss on overall health, including diabetes management and cardiovascular health. - Refer to [facility] program for exercise - Refer to nutritionist for dietary guidance  Type 2 Diabetes Mellitus Diabetes is well-controlled with a last HbA1c of 5.8. Transitioning from Rybelsus to Ozempic (semaglutide) for better management of diabetes and weight. Informed her not to take Rybelsus and Ozempic simultaneously to avoid adverse effects. - Switch from Rybelsus to Ozempic once it arrives  Panlobular Emphysema Panlobular emphysema and COPD with no recent changes in shortness of breath. Recent pulmonary function tests were normal.  Hypertension Blood pressure is 138/84 mmHg, within an acceptable range given her overall health status, although she prefers it to be lower.  Kidney Lesions Kidney lesions identified on a CT scan. An MRI is scheduled for May 1st to further evaluate these lesions.  Follow-up Will be followed up in six weeks to assess the impact of the new medication and lifestyle changes. - Schedule follow-up appointment in six weeks      There are no discontinued medications.  Return in about 6 weeks (around 05/26/2023).    Subjective:   Encounter date: 04/14/2023  JAMEE KEACH is a 68 y.o. female who has Ventral hernia; IT band syndrome; Right shoulder pain; Periumbilical pain; Hyperlipidemia LDL goal <70; Class 3 severe obesity due to excess calories with body mass index (BMI) of 50.0 to 59.9 in adult [facility]); Type 2 diabetes mellitus with obesity (HCC); CKD stage 3 secondary to diabetes (HCC); Postmenopausal estrogen deficiency; Former smoker; Long-term use of aspirin therapy; Hypertension associated with diabetes (HCC); Aortic valve  disease; COPD (chronic obstructive pulmonary disease) (HCC); Pharyngoesophageal dysphagia; Family history of colon cancer in father; Diplopia; Allergic rhinitis; PAF (paroxysmal atrial fibrillation) (HCC); Otalgia of left ear; Ingrown nail of great toe of left foot; Vitamin D deficiency; Low back pain; Chronic pain of both knees; Dyspnea on exertion; Coronary artery disease due to calcified coronary lesion; Aortic atherosclerosis (HCC); Abscess of lower back; and Kidney lesion on their problem list..   She  has a past medical history of Adhesive capsulitis, Aortic valve disease, Arthritis, Asthma, Back pain, CKD (chronic kidney disease) stage 3, GFR 30-59 ml/min (HCC), Constipation, COPD (chronic obstructive pulmonary disease) (HCC), Diabetes mellitus without complication (HCC), Diuretic-induced hypokalemia, Hypercholesterolemia, Hypertension, IT band syndrome, Morbid obesity (HCC), Palpitations, Periumbilical pain, Postmenopausal estrogen deficiency, Prediabetes, S/P TAVR (transcatheter aortic valve replacement) (12/26/2020), and Ventral hernia..   She presents with chief complaint of Weight Check (Pt still on rybelsus. ) .   Discussed the use of AI scribe software for clinical note transcription with the patient, who gave verbal consent to proceed.  History of Present Illness BELIA FEBO is a 68 year old female with diabetes and obesity who presents for weight management and diabetes follow-up.  She is awaiting a switch from Rybelsus to Ozempic for diabetes management, with her diabetes currently well controlled as evidenced by a recent A1c of 5.8%.  Her weight has increased slightly to 360 pounds from a previous 359 pounds. She is concerned about her weight's impact on her shortness of breath and osteoarthritis. She is awaiting contact from a [employer] program to assist with weight management.  She experiences shortness of breath, which she describes as 'standard' with no recent changes. Pulmonary  function  tests were normal despite her history of panlobular emphysema and COPD. A recent echocardiogram showed stable results.  She has a history of an abscess treated a month ago, with no current fever, chills, or drainage from the site. She indicates the location of the abscess but provides no further details.  Her medical history includes hypertension, hyperlipidemia, paroxysmal atrial fibrillation, non-obstructive coronary artery disease, and aortic atherosclerosis. Her blood pressure today is 138/84 mmHg. She is a former smoker.  She recently consulted a urologist who ordered an MRI for kidney lesions found on a CT scan, scheduled for May 1st. She has not yet received the results of a recent hearing test.   03/17/2023 HPI:  KLOEE BALLEW is a 68 year old female with hypertension and diabetes who presents for follow-up on blood pressure, diabetes, back pain, and knee pain.  Her diabetes is well controlled with an A1c of 5.8, consistent with previous levels. She monitors her blood sugar daily and has stable readings. She is currently taking Rybelsus 14 mg daily Jardiance 10 mg daily for diabetes management.  Both knees are affected by pain, with the left knee being more problematic. The pain has been present for some time but has improved with increased activity. She has been trying to walk more, which she wasn't doing much before. For pain management, she uses Blue Emu rub and occasionally takes Aleve, although she is cautious due to her blood thinner medication, apixaban.  ROS  Past Surgical History:  Procedure Laterality Date   BIOPSY  05/01/2021   Procedure: BIOPSY;  Surgeon: Lynann Bologna, MD;  Location: [facility];  Service: Gastroenterology;;   CESAREAN SECTION  1989   COLONOSCOPY     COLONOSCOPY WITH PROPOFOL N/A 05/01/2021   Procedure: COLONOSCOPY WITH PROPOFOL;  Surgeon: Lynann Bologna, MD;  Location: [facility];  Service: Gastroenterology;  Laterality: N/A;    ESOPHAGOGASTRODUODENOSCOPY (EGD) WITH PROPOFOL N/A 05/01/2021   Procedure: ESOPHAGOGASTRODUODENOSCOPY (EGD) WITH PROPOFOL;  Surgeon: Lynann Bologna, MD;  Location: [facility];  Service: Gastroenterology;  Laterality: N/A;   INTRAOPERATIVE TRANSTHORACIC ECHOCARDIOGRAM N/A 12/26/2020   Procedure: INTRAOPERATIVE TRANSTHORACIC ECHOCARDIOGRAM;  Surgeon: Orbie Pyo, MD;  Location: [facility];  Service: Open Heart Surgery;  Laterality: N/A;   MALONEY DILATION  05/01/2021   Procedure: Elease Hashimoto DILATION;  Surgeon: Lynann Bologna, MD;  Location: [facility];  Service: Gastroenterology;;   POLYPECTOMY  05/01/2021   Procedure: POLYPECTOMY;  Surgeon: Lynann Bologna, MD;  Location: [facility];  Service: Gastroenterology;;   RIGHT/LEFT HEART CATH AND CORONARY ANGIOGRAPHY N/A 11/24/2020   Procedure: RIGHT/LEFT HEART CATH AND CORONARY ANGIOGRAPHY;  Surgeon: Lyn Records, MD;  Location[facility] INVASIVE CV LAB;  Service: Cardiovascular;  Laterality: N/A;   TONSILLECTOMY AND ADENOIDECTOMY  1960   TRANSCATHETER AORTIC VALVE REPLACEMENT, TRANSFEMORAL N/A 12/26/2020   Procedure: TRANSCATHETER AORTIC VALVE REPLACEMENT, TRANSFEMORAL;  Surgeon: Orbie Pyo, MD;  Location: [facility] CV [facility];  Service: Open Heart Surgery;  Laterality: N/A;    Outpatient Medications Prior to Visit  Medication Sig Dispense Refill   amLODipine (NORVASC) 10 MG tablet Take 1 tablet (10 mg total) by mouth daily. 90 tablet 3   amoxicillin (AMOXIL) 500 MG tablet TAKE 4 TABLETS 1 HOUR PRIORTO DENTAL PROCEDURES AND   CLEANINGS 12 tablet 6   apixaban (ELIQUIS) 5 MG TABS tablet Take 1 tablet (5 mg total) by mouth 2 (two) times daily. 180 tablet 1   Ascorbic Acid (VITAMIN C) 1000 MG tablet Take 1,000 mg by mouth daily.  atorvastatin (LIPITOR) 10 MG tablet Take 1 tablet (10 mg total) by mouth daily. 90 tablet 3   azelastine (ASTELIN) 0.1 % nasal spray Place 2 sprays into both nostrils 2 (two) times daily. Use in each nostril as  directed 30 mL 12   Blood Glucose Monitoring Suppl (ONE TOUCH ULTRA 2) w/Device KIT Use as directed in the morning, at noon, and at bedtime. 1 kit 0   Cholecalciferol 50 MCG (2000 UT) TABS Take 2,000 Units by mouth daily.     Coenzyme Q10 (CO Q-10) 100 MG CAPS Take 100 mg by mouth daily.      Cyanocobalamin (B-12) 500 MCG TABS Take 500 mcg by mouth daily.     diltiazem (CARDIZEM) 30 MG tablet Take 1 tablet (30 mg total) by mouth every 6 (six) hours as needed (fast heart rate, heart palpitations). 30 tablet 6   empagliflozin (JARDIANCE) 10 MG TABS tablet Take 1 tablet (10 mg total) by mouth daily before breakfast. 90 tablet 0   fluticasone (FLONASE) 50 MCG/ACT nasal spray Place 2 sprays into both nostrils daily. 16 g 6   furosemide (LASIX) 20 MG tablet Take 1 tablet (20 mg total) by mouth daily as needed (for lower extremity swelling). 30 tablet 4   hydrALAZINE (APRESOLINE) 50 MG tablet Take 1 tablet (50 mg total) by mouth 3 (three) times daily. 270 tablet 3   ipratropium (ATROVENT) 0.06 % nasal spray Place 2 sprays into both nostrils 4 (four) times daily. 15 mL 12   losartan (COZAAR) 100 MG tablet Take 1 tablet (100 mg total) by mouth daily. Discontinue losartan/hctz 90 tablet 3   Menthol, Topical Analgesic, (BLUE-EMU MAXIMUM STRENGTH) 2.5 % LIQD Apply 1 application topically daily as needed (pain).     metoprolol succinate (TOPROL-XL) 25 MG 24 hr tablet Take 1 tablet (25 mg total) by mouth daily. 90 tablet 3   Olopatadine HCl 0.2 % SOLN Apply 1 drop to eye in the morning and at bedtime. 2.5 mL 3   omeprazole (PRILOSEC) 20 MG capsule Take 1 capsule (20 mg total) by mouth daily. 30 capsule 11   Semaglutide (RYBELSUS) 14 MG TABS Take by mouth.     Semaglutide, 1 MG/DOSE, (OZEMPIC, 1 MG/DOSE,) 4 MG/3ML SOPN Inject 1 mg into the skin once a week.     spironolactone (ALDACTONE) 25 MG tablet Take 1 tablet (25 mg total) by mouth daily. 90 tablet 2   Tiotropium Bromide-Olodaterol (STIOLTO RESPIMAT)  2.5-2.5 MCG/ACT AERS Inhale 2 puffs into the lungs daily. 4 g 3   Zinc 50 MG TABS Take 50 mg by mouth daily.     No facility-administered medications prior to visit.    Family History  Problem Relation Age of Onset   Dementia Mother    Arthritis Mother    Cancer Mother        pancreatic   Hypertension Mother    Obesity Mother    Cancer Father        prostate and colon   Colon cancer Father    Heart disease Father    Thyroid disease Father    Breast cancer Neg Hx     Social History   Socioeconomic History   Marital status: Single    Spouse name: Not on file   Number of children: 1   Years of education: Not on file   Highest education level: Not on file  Occupational History   Occupation: retired  Tobacco Use   Smoking status: Former  Current packs/day: 0.00    Average packs/day: 1 pack/day for 38.0 years (38.0 ttl pk-yrs)    Types: Cigarettes    Start date: 65    Quit date: 2013    Years since quitting: 12.2    Passive exposure: Never   Smokeless tobacco: Never  Vaping Use   Vaping status: Never Used  Substance and Sexual Activity   Alcohol use: Yes    Comment: socially   Drug use: No   Sexual activity: Not on file  Other Topics Concern   Not on file  Social History Narrative   Not on file   Social Drivers of Health   Financial Resource Strain: Low Risk  (03/28/2023)   Overall Financial Resource Strain (CARDIA)    Difficulty of Paying Living Expenses: Not hard at all  Food Insecurity: No Food Insecurity (03/28/2023)   Hunger Vital Sign    Worried About Running Out of Food in the Last Year: Never true    Ran Out of Food in the Last Year: Never true  Transportation Needs: No Transportation Needs (03/28/2023)   PRAPARE - Administrator, Civil Service (Medical): No    Lack of Transportation (Non-Medical): No  Physical Activity: Inactive (03/28/2023)   Exercise Vital Sign    Days of Exercise per Week: 0 days    Minutes of Exercise per  Session: 0 min  Stress: No Stress Concern Present (03/28/2023)   [employer] of Occupational Health - Occupational Stress Questionnaire    Feeling of Stress : Not at all  Social Connections: Socially Isolated (03/28/2023)   Social Connection and Isolation Panel [NHANES]    Frequency of Communication with Friends and Family: More than three times a week    Frequency of Social Gatherings with Friends and Family: Patient declined    Attends Religious Services: Never    Database administrator or Organizations: No    Attends Banker Meetings: Never    Marital Status: Never married  Intimate Partner Violence: Not At Risk (03/28/2023)   Humiliation, Afraid, Rape, and Kick questionnaire    Fear of Current or Ex-Partner: No    Emotionally Abused: No    Physically Abused: No    Sexually Abused: No                                                                                                  Objective:  Physical Exam: BP 138/84   Pulse 74   Temp (!) 97.5 F (36.4 C) (Temporal)   Wt (!) 360 lb (163.3 kg)   SpO2 97%   BMI 51.65 kg/m   Wt Readings from Last 3 Encounters:  04/14/23 (!) 360 lb (163.3 kg)  04/10/23 (!) 362 lb 9.6 oz (164.5 kg)  04/10/23 (!) 361 lb (163.7 kg)  03/17/23 (!) 359 lb 12.8 oz (163.2 kg) 03/12/23 (!) 363 lb (164.7 kg) 01/24/23 (!) 354 lb (160.6 kg)     Physical Exam Constitutional:      Appearance: She is obese.  HENT:     Head: Normocephalic and atraumatic.  Nose: Nose normal. No congestion.  Eyes:     General: No scleral icterus.    Extraocular Movements: Extraocular movements intact.  Cardiovascular:     Rate and Rhythm: Normal rate and regular rhythm.     Pulses: Normal pulses.     Heart sounds: Murmur heard.  Pulmonary:     Effort: Pulmonary effort is normal. No respiratory distress.     Breath sounds: Normal breath sounds.  Abdominal:     General: Abdomen is flat. Bowel sounds are normal.     Palpations: Abdomen is  soft.  Musculoskeletal:        General: Normal range of motion.     Lumbar back: Tenderness present. Negative right straight leg raise test and negative left straight leg raise test.     Right knee: Normal range of motion. No tenderness.     Left knee: Normal range of motion. No tenderness.  Lymphadenopathy:     Cervical: No cervical adenopathy.  Skin:    General: Skin is warm and dry.     Findings: No abscess.  Neurological:     General: No focal deficit present.     Mental Status: She is alert and oriented to person, place, and time. Mental status is at baseline.  Psychiatric:        Mood and Affect: Mood normal.        Behavior: Behavior normal.        Thought Content: Thought content normal.        Judgment: Judgment normal.     ECHOCARDIOGRAM COMPLETE Result Date: 04/07/2023    ECHOCARDIOGRAM REPORT   Patient Name:   RORY MONTEL Date of Exam: 04/07/2023 Medical Rec #:  [id number]        Height:       70.0 in Accession #:    [id number]       Weight:       359.8 lb Date of Birth:  July 06, 1955       BSA:          2.679 m Patient Age:    62 years         BP:           126/82 mmHg Patient Gender: F                HR:           74 bpm. Exam Location:  [address] Procedure: 2D Echo, Cardiac Doppler and Color Doppler (Both Spectral and Color            Flow Doppler were utilized during procedure). Indications:    Z95.2 Status post TAVR  History:        Patient has prior history of Echocardiogram examinations, most                 recent 12/27/2022. TAVR-62mm Sapien 3 Ultra Resilia, COPD; Risk                 Factors:Hypertension, Dyslipidemia and Diabetes.                 Aortic Valve: 26 mm Sapien prosthetic, stented (TAVR) valve is                 present in the aortic position. Procedure Date: 12/26/2020.  Sonographer:    Daphine Deutscher RDCS Referring Phys: [id number] Wille Celeste Anfernee Peschke IMPRESSIONS  1. Left ventricular ejection fraction, by estimation, is 65 to 70%. The left  ventricle has  normal function. The left ventricle has no regional wall motion abnormalities. There is moderate concentric left ventricular hypertrophy. Left ventricular diastolic parameters are indeterminate.  2. Right ventricular systolic function is normal. The right ventricular size is normal. Tricuspid regurgitation signal is inadequate for assessing PA pressure.  3. Left atrial size was mildly dilated.  4. The mitral valve is normal in structure. Trivial mitral valve regurgitation. No evidence of mitral stenosis.  5. The aortic valve has been repaired/replaced. Aortic valve regurgitation is not visualized. There is a 26 mm Sapien prosthetic (TAVR) valve present in the aortic position. Procedure Date: 12/26/2020. Echo findings are consistent with normal structure and function of the aortic valve prosthesis. Aortic valve mean gradient measures 12.8 mmHg.  6. Aortic dilatation noted. There is borderline dilatation of the ascending aorta, measuring 39 mm.  7. The inferior vena cava is normal in size with greater than 50% respiratory variability, suggesting right atrial pressure of 3 mmHg. Comparison(s): Prior images reviewed side by side. Changes from prior study are noted. TAVR gradient improved from 23 to 12.8 mmHg. FINDINGS  Left Ventricle: Left ventricular ejection fraction, by estimation, is 65 to 70%. The left ventricle has normal function. The left ventricle has no regional wall motion abnormalities. The left ventricular internal cavity size was normal in size. There is  moderate concentric left ventricular hypertrophy. Left ventricular diastolic parameters are indeterminate. Right Ventricle: The right ventricular size is normal. Right vetricular wall thickness was not well visualized. Right ventricular systolic function is normal. Tricuspid regurgitation signal is inadequate for assessing PA pressure. Left Atrium: Left atrial size was mildly dilated. Right Atrium: Right atrial size was normal in size.  Pericardium: There is no evidence of pericardial effusion. Mitral Valve: The mitral valve is normal in structure. Trivial mitral valve regurgitation. No evidence of mitral valve stenosis. Tricuspid Valve: The tricuspid valve is grossly normal. Tricuspid valve regurgitation is not demonstrated. No evidence of tricuspid stenosis. The aortic valve has been repaired/replaced. Aortic valve regurgitation is not visualized. There is a 26 mm Sapien prosthetic, stented (TAVR) valve present in the aortic position. Procedure Date: 12/26/2020. Echo findings are consistent with normal structure and function of the aortic valve prosthesis. Pulmonic Valve: The pulmonic valve was not well visualized. Pulmonic valve regurgitation is not visualized. Aorta: Aortic dilatation noted. There is borderline dilatation of the ascending aorta, measuring 39 mm. Venous: The inferior vena cava is normal in size with greater than 50% respiratory variability, suggesting right atrial pressure of 3 mmHg. IAS/Shunts: The atrial septum is grossly normal.  LEFT VENTRICLE PLAX 2D LVIDd:         4.80 cm Diastology LVIDs:         2.90 cm LV e' medial:    6.15 cm/s LV PW:         1.75 cm LV E/e' medial:  17.2 LV IVS:        1.71 cm LV e' lateral:   4.95 cm/s                        LV E/e' lateral: 21.4  RIGHT VENTRICLE             IVC RV Basal diam:  3.25 cm     IVC diam: 1.20 cm RV S prime:     16.90 cm/s TAPSE (M-mode): 2.7 cm LEFT ATRIUM             Index        RIGHT  ATRIUM           Index LA diam:        4.70 cm 1.75 cm/m   RA Area:     11.00 cm LA Vol (A2C):   44.9 ml 16.76 ml/m  RA Volume:   24.00 ml  8.96 ml/m LA Vol (A4C):   72.6 ml 27.10 ml/m LA Biplane Vol: 57.6 ml 21.50 ml/m  AORTIC VALVE AV Vmax:           240.20 cm/s AV Vmean:          165.200 cm/s AV VTI:            0.539 m AV Peak Grad:      23.1 mmHg AV Mean Grad:      12.8 mmHg LVOT Vmax:         129.67 cm/s LVOT Vmean:        86.733 cm/s LVOT VTI:          0.307 m LVOT/AV VTI  ratio: 0.57  AORTA Ao Root diam: 3.50 cm Ao Asc diam:  3.90 cm MITRAL VALVE MV Area (PHT): 2.93 cm     SHUNTS MV Decel Time: 259 msec     Systemic VTI: 0.31 m MV E velocity: 106.00 cm/s MV A velocity: 109.50 cm/s MV E/A ratio:  0.97 Jodelle Red MD Electronically signed by Jodelle Red MD Signature Date/Time: 04/07/2023/5:51:34 PM    Final    DG Bone Density Result Date: 02/21/2023 EXAM: DUAL X-RAY ABSORPTIOMETRY (DXA) FOR BONE MINERAL DENSITY IMPRESSION: Referring Physician:  Garnette Gunner Your patient completed a bone mineral density test using GE Lunar iDXA system (analysis version: 16). Technologist: BEC PATIENT: Name: Letizia, Hook Patient ID: [id number] Birth Date: [birth year] Height: 70.0 in. Sex: Female Measured: 02/21/2023 Weight: 357.4 lbs. Indications: COPD, Diabetic non insulin, Eliquis, Estrogen Deficient, Height Loss (781.91), Low Calcium Intake, Omeprazole, Postmenopausal, Secondary Osteoporosis, Vitamin D Deficient Fractures: None Treatments: Vitamin D (E933.5) ASSESSMENT: The BMD measured at Femur Neck Left is 0.931 g/cm2 with a T-score of -0.8. This patient is considered normal according to World Health Organization [facility]) criteria. The lumbar spine was excluded due to machine limitations. The quality of the exam is limited by patient body habitus. Site Region Measured Date Measured Age YA BMD Significant CHANGE T-score DualFemur Neck Left 02/21/2023 69.1 -0.6 0.931 g/cm2 * DualFemur Neck Left 05[birth year]14/2019 65.3 0.2 1.069 g/cm2 DualFemur Total Mean 02/21/2023 69.1 0.3 1.042 g/cm2 * DualFemur Total Mean 05/14/2019 65.3 0.9 1.122 g/cm2 Right Forearm Radius 33% 02/21/2023 69.1 0.7 0.949 g/cm2 World Health Organization [facility]) criteria for post-menopausal, Caucasian Women: Normal       T-score at or above -1 SD Osteopenia   T-score between -1 and -2.5 SD Osteoporosis T-score at or below -2.5 SD RECOMMENDATION: 1. All patients should optimize calcium and vitamin D intake. 2. Consider  FDA-approved medical therapies in postmenopausal women and men aged 5 years and older, based on the following: a. A hip or vertebral (clinical or morphometric) fracture. b. T-score = -2.5 at the femoral neck or spine after appropriate evaluation to exclude secondary causes. c. Low bone mass (T-score between -1.0 and -2.5 at the femoral neck or spine) and a 10-year probability of a hip fracture = 3% or a 10-year probability of a major osteoporosis-related fracture = 20% based on the [place]-adapted WHO algorithm. d. Clinician judgment and/or patient preferences may indicate treatment for people with 10-year fracture probabilities above or below these levels. FOLLOW-UP: Patients with diagnosis  of osteoporosis or at high risk for fracture should have regular bone mineral density tests.? Patients eligible for Medicare are allowed routine testing every 2 years.? The testing frequency can be increased to one year for patients who have rapidly progressing disease, are receiving or discontinuing medical therapy to restore bone mass, or have additional risk factors. I have reviewed this study and agree with the findings. [facility] Electronically Signed   By: Baird Lyons M.D.   On: 02/21/2023 10:40    Recent Results (from the past 2160 hours)  POCT glycosylated hemoglobin (Hb A1C)     Status: Abnormal   Collection Time: 03/17/23 10:52 AM  Result Value Ref Range   Hemoglobin A1C 5.8 (A) 4.0 - 5.6 %   HbA1c POC (<> result, manual entry)     HbA1c, POC (prediabetic range)     HbA1c, POC (controlled diabetic range)    WOUND CULTURE     Status: None   Collection Time: 03/17/23 11:46 AM   Specimen: Wound  Result Value Ref Range   MICRO NUMBER: [id number]    SPECIMEN QUALITY: Adequate    SOURCE: WOUND (SITE NOT SPECIFIED)    STATUS: FINAL    GRAM STAIN:      Many White blood cells seen Few epithelial cells No organisms seen   RESULT: No Growth   ECHOCARDIOGRAM COMPLETE     Status: None   Collection  Time: 04/07/23 10:19 AM  Result Value Ref Range   Area-P 1/2 2.93 cm2   S' Lateral 2.90 cm   Ao pk vel 2.40 m/s   AV Mean grad 12.8 mmHg   AV Peak grad 23.1 mmHg   Est EF 65 - 70%   Pulmonary function test     Status: None (Preliminary result)   Collection Time: 04/10/23  9:28 AM  Result Value Ref Range   FVC-Pre 2.83 L   FVC-%Pred-Pre 74 %   FVC-Post 2.77 L   FVC-%Pred-Post 73 %   FVC-%Change-Post -2 %   FEV1-Pre 2.44 L   FEV1-%Pred-Pre 84 %   FEV1-Post 2.44 L   FEV1-%Pred-Post 84 %   FEV1-%Change-Post 0 %   FEV6-Pre 2.83 L   FEV6-%Pred-Pre 77 %   FEV6-Post 2.77 L   FEV6-%Pred-Post 75 %   FEV6-%Change-Post -2 %   Pre FEV1/FVC ratio 86 %   FEV1FVC-%Pred-Pre 113 %   Post FEV1/FVC ratio 88 %   FEV1FVC-%Change-Post 2 %   Pre FEV6/FVC Ratio 100 %   FEV6FVC-%Pred-Pre 104 %   Post FEV6/FVC ratio 100 %   FEV6FVC-%Pred-Post 104 %   FEF 25-75 Pre 3.07 L/sec   FEF2575-%Pred-Pre 133 %   FEF 25-75 Post 3.31 L/sec   FEF2575-%Pred-Post 143 %   FEF2575-%Change-Post 7 %   RV 2.07 L   RV % pred 84 %   TLC 4.99 L   TLC % pred 83 %   DLCO unc 22.31 ml/min/mmHg   DLCO unc % pred 93 %   DLCO cor 22.31 ml/min/mmHg   DLCO cor % pred 93 %   DL/VA 7.82 ml/min/mmHg/L   DL/VA % pred 956 %        Garner Nash, MD, MS

## 2023-04-14 NOTE — Patient Instructions (Signed)
 Visit Information  Thank you for taking time to visit with me today. Please don't hesitate to contact me if I can be of assistance to you.   Following are the goals we discussed today:   Goals Addressed             This Visit's Progress    Maintain Health-DM       Patient Goals/Self Care Activities: -Patient/Caregiver will take medications as prescribed   -Patient/Caregiver will attend all scheduled provider appointments -Patient/Caregiver will call provider office for new concerns or questions   -check blood sugar at prescribed times -check blood sugar if I feel it is too high or too low -record values and write them down take them to all doctor visits  -check feet daily for cuts, sores or redness -trim toenails straight across -wash and dry feet carefully every day   Spoke with patient.  She reports she is doing good. She reports that her sugars have done well, with readings still in 100-120 range.  Reviewed diabetes management. She verbalized understanding.          Your next appointment is by telephone on 06/02/23 at 100 pm   Please call the care guide team at [phone] if you need to cancel or reschedule your appointment.   If you are experiencing a Mental Health or Behavioral Health Crisis or need someone to talk to, please call the Suicide and Crisis Lifeline: 988   Patient verbalizes understanding of instructions and care plan provided today and agrees to view in MyChart. Active MyChart status and patient understanding of how to access instructions and care plan via MyChart confirmed with patient.     The patient has been provided with contact information for the care management team and has been advised to call with any health related questions or concerns.   Bary Leriche RN, MSN [facility], [facility] RN Care Manager Direct Dial: [phone]  Fax: [phone] Website: [email or web address]

## 2023-04-14 NOTE — Patient Outreach (Signed)
 Care Coordination   Follow Up Visit Note   04/14/2023 Name: Rose Miller MRN: [id number] DOB: 1955/12/07  Rose Miller is a 68 y.o. year old female who sees Garnette Gunner, MD for primary care. I spoke with  [name] by phone today.  What matters to the patients health and wellness today?   Maintain health    Goals Addressed             This Visit's Progress    Maintain Health-DM       Patient Goals/Self Care Activities: -Patient/Caregiver will take medications as prescribed   -Patient/Caregiver will attend all scheduled provider appointments -Patient/Caregiver will call provider office for new concerns or questions   -check blood sugar at prescribed times -check blood sugar if I feel it is too high or too low -record values and write them down take them to all doctor visits  -check feet daily for cuts, sores or redness -trim toenails straight across -wash and dry feet carefully every day   Spoke with patient.  She reports she is doing good. She reports that her sugars have done well, with readings still in 100-120 range.  Reviewed diabetes management. She verbalized understanding.          SDOH assessments and interventions completed:  Yes     Care Coordination Interventions:  Yes, provided    Follow up plan: Follow up call scheduled for May    Encounter Outcome:  Patient Visit Completed    Bary Leriche RN, MSN [facility], [facility] RN Care Manager Direct Dial: [phone]  Fax: [phone] Website: [email or web address]

## 2023-04-17 ENCOUNTER — Other Ambulatory Visit (HOSPITAL_COMMUNITY): Payer: Self-pay

## 2023-04-17 ENCOUNTER — Other Ambulatory Visit: Payer: Self-pay | Admitting: Family Medicine

## 2023-04-17 DIAGNOSIS — E1169 Type 2 diabetes mellitus with other specified complication: Secondary | ICD-10-CM

## 2023-04-17 MED ORDER — ONETOUCH DELICA LANCETS 33G MISC
1.0000 | Freq: Three times a day (TID) | 0 refills | Status: DC
  Filled 2023-04-17: qty 100, 30d supply, fill #0

## 2023-04-18 ENCOUNTER — Other Ambulatory Visit: Payer: Self-pay | Admitting: Family Medicine

## 2023-04-18 ENCOUNTER — Other Ambulatory Visit: Payer: Self-pay

## 2023-04-18 ENCOUNTER — Other Ambulatory Visit (HOSPITAL_COMMUNITY): Payer: Self-pay

## 2023-04-18 DIAGNOSIS — E1169 Type 2 diabetes mellitus with other specified complication: Secondary | ICD-10-CM

## 2023-04-19 ENCOUNTER — Other Ambulatory Visit (HOSPITAL_COMMUNITY): Payer: Self-pay

## 2023-04-22 ENCOUNTER — Telehealth: Payer: Self-pay

## 2023-04-22 ENCOUNTER — Other Ambulatory Visit (HOSPITAL_COMMUNITY): Payer: Self-pay

## 2023-04-22 DIAGNOSIS — E1169 Type 2 diabetes mellitus with other specified complication: Secondary | ICD-10-CM

## 2023-04-22 NOTE — Telephone Encounter (Signed)
 Copied from CRM [id number]. Topic: Clinical - Prescription Issue >> Apr 22, 2023  2:28 PM Martinique E wrote: Reason for CRM: Patient called in stating that her pharmacy called her to relay that her PCP denied the refill of her Blood Glucose Test Strips, and she is not sure why. Patient's callback number [phone] to discuss.

## 2023-04-22 NOTE — Telephone Encounter (Signed)
 Please advise. Are you able to order blood glucose test strips for patient?

## 2023-04-25 ENCOUNTER — Other Ambulatory Visit (HOSPITAL_COMMUNITY): Payer: Self-pay

## 2023-04-25 ENCOUNTER — Other Ambulatory Visit: Payer: Self-pay

## 2023-04-25 MED ORDER — ONETOUCH ULTRA TEST VI STRP
ORAL_STRIP | 12 refills | Status: AC
  Filled 2023-04-25: qty 100, 33d supply, fill #0
  Filled 2023-07-04: qty 100, 33d supply, fill #1
  Filled 2023-10-07: qty 100, 33d supply, fill #2
  Filled 2024-01-18: qty 100, 33d supply, fill #3

## 2023-04-25 NOTE — Telephone Encounter (Signed)
 Copied from CRM [id number]. Topic: Clinical - Prescription Issue >> Apr 25, 2023 11:40 AM Alcus Dad wrote: Reason for CRM: Patient is very upset that Dr. Gibson Ramp order for test strips and she wants to know why. Dr. Has not reached out to her and she wants to speak with someone about her issue

## 2023-04-25 NOTE — Progress Notes (Signed)
   04/25/2023  Patient ID: Rose Miller, female   DOB: 1955-03-05, 68 y.o.   MRN: [id number]  Incoming message from patient stating she is out of her One Touch Ultra test strips, and refill has not been sent in to pharmacy.  Sending prescription under [facility] standing order.  Follow-up already scheduled with patient for 5/20.  [name], PharmD, DPLA

## 2023-04-28 ENCOUNTER — Other Ambulatory Visit: Payer: Self-pay

## 2023-04-28 ENCOUNTER — Other Ambulatory Visit (HOSPITAL_COMMUNITY): Payer: Self-pay

## 2023-04-30 ENCOUNTER — Other Ambulatory Visit: Payer: Self-pay

## 2023-04-30 ENCOUNTER — Other Ambulatory Visit (HOSPITAL_COMMUNITY): Payer: Self-pay

## 2023-04-30 MED ORDER — BLOOD GLUCOSE TEST VI STRP
1.0000 | ORAL_STRIP | Freq: Three times a day (TID) | 0 refills | Status: AC
  Filled 2023-04-30: qty 100, 34d supply, fill #0

## 2023-04-30 MED ORDER — LANCETS 33G MISC
1.0000 | Freq: Three times a day (TID) | 0 refills | Status: AC
  Filled 2023-04-30: qty 100, 30d supply, fill #0

## 2023-04-30 MED ORDER — LANCET DEVICE MISC
1.0000 | Freq: Three times a day (TID) | 0 refills | Status: AC
  Filled 2023-04-30: qty 1, 30d supply, fill #0

## 2023-04-30 MED ORDER — ONETOUCH ULTRA 2 W/DEVICE KIT
1.0000 | PACK | Freq: Three times a day (TID) | 0 refills | Status: DC
  Filled 2023-04-30: qty 1, 1d supply, fill #0
  Filled 2023-10-07: qty 1, 30d supply, fill #0

## 2023-04-30 NOTE — Addendum Note (Signed)
 Addended by: Kasandra Pain B on: 04/30/2023 01:00 PM   Modules accepted: Orders

## 2023-05-01 ENCOUNTER — Other Ambulatory Visit (HOSPITAL_COMMUNITY): Payer: Self-pay

## 2023-05-02 ENCOUNTER — Other Ambulatory Visit (HOSPITAL_COMMUNITY): Payer: Self-pay

## 2023-05-03 ENCOUNTER — Other Ambulatory Visit (HOSPITAL_COMMUNITY): Payer: Self-pay

## 2023-05-05 ENCOUNTER — Other Ambulatory Visit: Payer: Self-pay

## 2023-05-05 ENCOUNTER — Telehealth: Payer: Self-pay

## 2023-05-05 NOTE — Telephone Encounter (Signed)
 Copied from CRM [id number]. Topic: General - Other >> May 05, 2023 12:38 PM Rose Miller wrote: Reason for CRM: Patient stated her Ozempic  medication should have been delivered on 05/01/23 from [facility] and she is calling to check the status.

## 2023-05-05 NOTE — Telephone Encounter (Signed)
 Went to check medication refrigerator and found patient's medication. Patient notified via mychart that medication was delivered and ready for pick up.

## 2023-05-06 ENCOUNTER — Other Ambulatory Visit: Payer: Self-pay

## 2023-05-06 ENCOUNTER — Other Ambulatory Visit (HOSPITAL_COMMUNITY): Payer: Self-pay

## 2023-05-06 NOTE — Telephone Encounter (Signed)
 Pt here to pick up 4 boxes of Ozempic .

## 2023-05-11 ENCOUNTER — Other Ambulatory Visit: Payer: Self-pay | Admitting: Family Medicine

## 2023-05-11 DIAGNOSIS — E785 Hyperlipidemia, unspecified: Secondary | ICD-10-CM

## 2023-05-12 ENCOUNTER — Telehealth: Payer: Self-pay

## 2023-05-12 ENCOUNTER — Encounter: Payer: Self-pay | Admitting: Pulmonary Disease

## 2023-05-12 ENCOUNTER — Ambulatory Visit (INDEPENDENT_AMBULATORY_CARE_PROVIDER_SITE_OTHER): Payer: HMO | Admitting: Pulmonary Disease

## 2023-05-12 ENCOUNTER — Other Ambulatory Visit (HOSPITAL_COMMUNITY): Payer: Self-pay

## 2023-05-12 VITALS — BP 120/74 | HR 86 | Ht 70.0 in | Wt 366.2 lb

## 2023-05-12 DIAGNOSIS — J431 Panlobular emphysema: Secondary | ICD-10-CM

## 2023-05-12 DIAGNOSIS — Z952 Presence of prosthetic heart valve: Secondary | ICD-10-CM | POA: Diagnosis not present

## 2023-05-12 DIAGNOSIS — Z87891 Personal history of nicotine dependence: Secondary | ICD-10-CM

## 2023-05-12 DIAGNOSIS — J439 Emphysema, unspecified: Secondary | ICD-10-CM

## 2023-05-12 DIAGNOSIS — R0602 Shortness of breath: Secondary | ICD-10-CM

## 2023-05-12 DIAGNOSIS — R0609 Other forms of dyspnea: Secondary | ICD-10-CM | POA: Diagnosis not present

## 2023-05-12 MED ORDER — ATORVASTATIN CALCIUM 10 MG PO TABS
10.0000 mg | ORAL_TABLET | Freq: Every day | ORAL | 3 refills | Status: AC
  Filled 2023-05-12: qty 90, 90d supply, fill #0
  Filled 2023-08-11: qty 90, 90d supply, fill #1
  Filled 2023-11-09: qty 90, 90d supply, fill #2
  Filled 2024-02-03: qty 90, 90d supply, fill #3

## 2023-05-12 NOTE — Progress Notes (Signed)
 Rose Miller    [id number]    12-08-1955  Primary Care Physician:Catheryn Cluck, MD  Referring Physician: Catheryn Cluck, MD [address] [place],  [place] [address]  Chief complaint:   Patient being evaluated for shortness of breath  HPI:  Feels a little bit better  Has been using Stiolto but finds it very expensive  Denies any wheezing Denies any chest pain or chest discomfort  Tries to stay active  Quit smoking in 2013, 30-pack-year smoking history  She does have shortness of breath with a lot of activities History of hypertension, diabetes, hypercholesterolemia, chronic kidney disease  History of irregular heartbeats, headaches  Outpatient Encounter Medications as of 05/12/2023  Medication Sig   amLODipine  (NORVASC ) 10 MG tablet Take 1 tablet (10 mg total) by mouth daily.   amoxicillin  (AMOXIL ) 500 MG tablet TAKE 4 TABLETS 1 HOUR PRIORTO DENTAL PROCEDURES AND   CLEANINGS   apixaban  (ELIQUIS ) 5 MG TABS tablet Take 1 tablet (5 mg total) by mouth 2 (two) times daily.   Ascorbic Acid (VITAMIN C) 1000 MG tablet Take 1,000 mg by mouth daily.   atorvastatin  (LIPITOR) 10 MG tablet Take 1 tablet (10 mg total) by mouth daily.   azelastine  (ASTELIN ) 0.1 % nasal spray Place 2 sprays into both nostrils 2 (two) times daily. Use in each nostril as directed   Blood Glucose Monitoring Suppl (ONE TOUCH ULTRA 2) w/Device KIT Use as directed in the morning, at noon, and at bedtime.   Blood Glucose Monitoring Suppl DEVI Use as directed.   Cholecalciferol 50 MCG (2000 UT) TABS Take 2,000 Units by mouth daily.   Coenzyme Q10 (CO Q-10) 100 MG CAPS Take 100 mg by mouth daily.    Cyanocobalamin (B-12) 500 MCG TABS Take 500 mcg by mouth daily.   diltiazem  (CARDIZEM ) 30 MG tablet Take 1 tablet (30 mg total) by mouth every 6 (six) hours as needed (fast heart rate, heart palpitations).   empagliflozin  (JARDIANCE ) 10 MG TABS tablet Take 1 tablet (10 mg total) by mouth  daily before breakfast.   fluticasone  (FLONASE ) 50 MCG/ACT nasal spray Place 2 sprays into both nostrils daily.   furosemide  (LASIX ) 20 MG tablet Take 1 tablet (20 mg total) by mouth daily as needed (for lower extremity swelling).   Glucose Blood (BLOOD GLUCOSE TEST STRIPS) STRP 1 each by In Vitro route in the morning, at noon, and at bedtime. May substitute to any manufacturer covered by patient's insurance.   glucose blood (ONETOUCH ULTRA TEST) test strip Use to check blood sugar up to three times daily   hydrALAZINE  (APRESOLINE ) 50 MG tablet Take 1 tablet (50 mg total) by mouth 3 (three) times daily.   ipratropium (ATROVENT ) 0.06 % nasal spray Place 2 sprays into both nostrils 4 (four) times daily.   Lancet Device MISC Use in the morning, at noon, and at bedtime.   Lancets 33G MISC USE 1 each TO CHECK BLOOD GLUCOSE in the morning, at noon, and at bedtime.   losartan  (COZAAR ) 100 MG tablet Take 1 tablet (100 mg total) by mouth daily. Discontinue losartan /hctz   Menthol, Topical Analgesic, (BLUE-EMU MAXIMUM STRENGTH) 2.5 % LIQD Apply 1 application topically daily as needed (pain).   metoprolol  succinate (TOPROL -XL) 25 MG 24 hr tablet Take 1 tablet (25 mg total) by mouth daily.   Olopatadine  HCl 0.2 % SOLN Apply 1 drop to eye in the morning and at bedtime.   omeprazole  (PRILOSEC ) 20  MG capsule Take 1 capsule (20 mg total) by mouth daily.   OneTouch Delica Lancets 33G MISC Use 1 lancet in the morning, at noon, and at bedtime.   Semaglutide  (RYBELSUS ) 14 MG TABS Take by mouth.   Semaglutide , 1 MG/DOSE, (OZEMPIC , 1 MG/DOSE,) 4 MG/3ML SOPN Inject 1 mg into the skin once a week.   spironolactone  (ALDACTONE ) 25 MG tablet Take 1 tablet (25 mg total) by mouth daily.   Tiotropium Bromide-Olodaterol (STIOLTO RESPIMAT ) 2.5-2.5 MCG/ACT AERS Inhale 2 puffs into the lungs daily.   Zinc 50 MG TABS Take 50 mg by mouth daily.   No facility-administered encounter medications on file as of 05/12/2023.     Allergies as of 05/12/2023   (No Known Allergies)    Past Medical History:  Diagnosis Date   Adhesive capsulitis    Aortic valve disease    Arthritis    Asthma    Back pain    CKD (chronic kidney disease) stage 3, GFR 30-59 ml/min (HCC)    Constipation    COPD (chronic obstructive pulmonary disease) (HCC)    Diabetes mellitus without complication (HCC)    Diuretic-induced hypokalemia    Hypercholesterolemia    Hypertension    IT band syndrome    Morbid obesity (HCC)    Palpitations    Periumbilical pain    Postmenopausal estrogen deficiency    Prediabetes    S/P TAVR (transcatheter aortic valve replacement) 12/26/2020   Ventral hernia     Past Surgical History:  Procedure Laterality Date   BIOPSY  05/01/2021   Procedure: BIOPSY;  Surgeon: Lajuan Pila, MD;  Location: [facility];  Service: Gastroenterology;;   CESAREAN SECTION  1989   COLONOSCOPY     COLONOSCOPY WITH PROPOFOL  N/A 05/01/2021   Procedure: COLONOSCOPY WITH PROPOFOL ;  Surgeon: Lajuan Pila, MD;  Location: [facility];  Service: Gastroenterology;  Laterality: N/A;   ESOPHAGOGASTRODUODENOSCOPY (EGD) WITH PROPOFOL  N/A 05/01/2021   Procedure: ESOPHAGOGASTRODUODENOSCOPY (EGD) WITH PROPOFOL ;  Surgeon: Lajuan Pila, MD;  Location: [facility];  Service: Gastroenterology;  Laterality: N/A;   INTRAOPERATIVE TRANSTHORACIC ECHOCARDIOGRAM N/A 12/26/2020   Procedure: INTRAOPERATIVE TRANSTHORACIC ECHOCARDIOGRAM;  Surgeon: Kyra Phy, MD;  Location: [facility];  Service: Open Heart Surgery;  Laterality: N/A;   MALONEY DILATION  05/01/2021   Procedure: Londa Rival DILATION;  Surgeon: Lajuan Pila, MD;  Location: [facility];  Service: Gastroenterology;;   POLYPECTOMY  05/01/2021   Procedure: POLYPECTOMY;  Surgeon: Lajuan Pila, MD;  Location: [facility];  Service: Gastroenterology;;   RIGHT/LEFT HEART CATH AND CORONARY ANGIOGRAPHY N/A 11/24/2020   Procedure: RIGHT/LEFT HEART CATH AND CORONARY  ANGIOGRAPHY;  Surgeon: Arty Binning, MD;  Location: [facility] INVASIVE CV LAB;  Service: Cardiovascular;  Laterality: N/A;   TONSILLECTOMY AND ADENOIDECTOMY  1960   TRANSCATHETER AORTIC VALVE REPLACEMENT, TRANSFEMORAL N/A 12/26/2020   Procedure: TRANSCATHETER AORTIC VALVE REPLACEMENT, TRANSFEMORAL;  Surgeon: Kyra Phy, MD;  Location: [facility];  Service: Open Heart Surgery;  Laterality: N/A;    Family History  Problem Relation Age of Onset   Dementia Mother    Arthritis Mother    Cancer Mother        pancreatic   Hypertension Mother    Obesity Mother    Cancer Father        prostate and colon   Colon cancer Father    Heart disease Father    Thyroid  disease Father    Breast cancer Neg Hx     Social History  Socioeconomic History   Marital status: Single    Spouse name: Not on file   Number of children: 1   Years of education: Not on file   Highest education level: Not on file  Occupational History   Occupation: retired  Tobacco Use   Smoking status: Former    Current packs/day: 0.00    Average packs/day: 1 pack/day for 38.0 years (38.0 ttl pk-yrs)    Types: Cigarettes    Start date: 44    Quit date: 2013    Years since quitting: 12.3    Passive exposure: Never   Smokeless tobacco: Never  Vaping Use   Vaping status: Never Used  Substance and Sexual Activity   Alcohol use: Yes    Comment: socially   Drug use: No   Sexual activity: Not on file  Other Topics Concern   Not on file  Social History Narrative   Not on file   Social Drivers of Health   Financial Resource Strain: Low Risk  (03/28/2023)   Overall Financial Resource Strain (CARDIA)    Difficulty of Paying Living Expenses: Not hard at all  Food Insecurity: No Food Insecurity (03/28/2023)   Hunger Vital Sign    Worried About Running Out of Food in the Last Year: Never true    Ran Out of Food in the Last Year: Never true  Transportation Needs: No Transportation Needs (03/28/2023)   PRAPARE -  Administrator, Civil Service (Medical): No    Lack of Transportation (Non-Medical): No  Physical Activity: Inactive (03/28/2023)   Exercise Vital Sign    Days of Exercise per Week: 0 days    Minutes of Exercise per Session: 0 min  Stress: No Stress Concern Present (03/28/2023)   [employer] of Occupational Health - Occupational Stress Questionnaire    Feeling of Stress : Not at all  Social Connections: Socially Isolated (03/28/2023)   Social Connection and Isolation Panel [NHANES]    Frequency of Communication with Friends and Family: More than three times a week    Frequency of Social Gatherings with Friends and Family: Patient declined    Attends Religious Services: Never    Database administrator or Organizations: No    Attends Banker Meetings: Never    Marital Status: Never married  Intimate Partner Violence: Not At Risk (03/28/2023)   Humiliation, Afraid, Rape, and Kick questionnaire    Fear of Current or Ex-Partner: No    Emotionally Abused: No    Physically Abused: No    Sexually Abused: No    Review of Systems  Respiratory:  Positive for shortness of breath. Negative for chest tightness.   Cardiovascular:  Negative for chest pain.    Vitals:   05/12/23 1452 05/12/23 1454  BP: 96/60 120/74  Pulse: 86   SpO2: 97%      Physical Exam Constitutional:      Appearance: She is obese.  HENT:     Head: Normocephalic.     Nose: Nose normal.     Mouth/Throat:     Mouth: Mucous membranes are moist.  Eyes:     General: No scleral icterus. Cardiovascular:     Rate and Rhythm: Normal rate and regular rhythm.     Heart sounds: No murmur heard. Pulmonary:     Effort: No respiratory distress.     Breath sounds: No stridor. No wheezing or rhonchi.  Musculoskeletal:     Cervical back: No rigidity or tenderness.  Neurological:  Mental Status: She is alert.  Psychiatric:        Mood and Affect: Mood normal.    Data  Reviewed: Echocardiogram 12/27/2022 with diastolic dysfunction  CT chest 07/17/2022 reviewed by myself showing evidence of emphysema  Spirometry 10/09/2021-no significant obstructive disease-reviewed by myself  PFT was reviewed with the patient showing no significant obstruction, no significant bronchodilator response, no restriction, normal diffusing capacity   Assessment:  Multifactorial shortness of breath with deconditioning likely playing a big role  She did smoke in the past, does have emphysema on her CT scan - PFT does show preservation of lung function  30-pack-year smoking history in the past  History of TAVR surgery in 2022  Plan/Recommendations: Continue Stiolto  Will look for alternatives that may be better covered for her  Continue graded activities as tolerated  Patient will be starting an exercise program that should help with  Encouraged weight loss efforts  Follow-up in about 3 months   Myer Artis MD [facility] 05/12/2023, 2:56 PM  CC: Catheryn Cluck, MD

## 2023-05-12 NOTE — Telephone Encounter (Signed)
 Can you please advise with formulary alternatives for stiolto inhaler. Routing to pharmacy.

## 2023-05-12 NOTE — Telephone Encounter (Signed)
 Unable to do a benefits investigation due to the Stiolto being recently filled at patients pharmacy. Patient may have a deductible to meet that will cause prices to be higher at this time.

## 2023-05-12 NOTE — Patient Instructions (Signed)
 Your breathing study is relatively normal  We will look at options for a different inhaler that may be more affordable, message sent to pharmacy to look into this  Continue using current inhalers  Graded activities as tolerated  Follow-up in 3 months  Call us  with significant concerns

## 2023-05-15 ENCOUNTER — Ambulatory Visit
Admission: RE | Admit: 2023-05-15 | Discharge: 2023-05-15 | Disposition: A | Discharge status: Home / Self Care | Source: Ambulatory Visit | Attending: Adult Health | Admitting: Adult Health

## 2023-05-15 DIAGNOSIS — K8020 Calculus of gallbladder without cholecystitis without obstruction: Secondary | ICD-10-CM | POA: Diagnosis not present

## 2023-05-15 DIAGNOSIS — K760 Fatty (change of) liver, not elsewhere classified: Secondary | ICD-10-CM | POA: Diagnosis not present

## 2023-05-15 DIAGNOSIS — N2889 Other specified disorders of kidney and ureter: Secondary | ICD-10-CM | POA: Diagnosis not present

## 2023-05-15 DIAGNOSIS — R93429 Abnormal radiologic findings on diagnostic imaging of unspecified kidney: Secondary | ICD-10-CM

## 2023-05-15 MED ORDER — GADOPICLENOL 0.5 MMOL/ML IV SOLN
10.0000 mL | Freq: Once | INTRAVENOUS | Status: AC | PRN
  Administered 2023-05-15: 10 mL via INTRAVENOUS

## 2023-05-19 ENCOUNTER — Telehealth: Payer: Self-pay

## 2023-05-19 NOTE — Telephone Encounter (Signed)
 PAP: Patient assistance application for Eliquis through [employer] ([employer]) has been mailed to pt's home address on file. Provider portion of application will be faxed to provider's office.

## 2023-05-19 NOTE — Progress Notes (Signed)
 [facility] PREP Evaluation  Patient Details  Name: Rose Miller MRN: [id number] Date of Birth: 1955-08-10 Age: 68 y.o. PCP: Catheryn Cluck, MD  Vitals:   05/19/23 1128  BP: 122/78  Pulse: 73  SpO2: 95%  Weight: (!) 364 lb 6.4 oz (165.3 kg)     [facility] Eval - 05/19/23 1100       [facility] "PREP" Location   [facility] "PREP" Location [facility]      Referral    Referring Provider Theodis Fiscal    Reason for referral High Cholesterol;Hypertension;Inactivity;Obesitity/Overweight;Diabetes    Program Start Date 05/26/23      Measurement   Waist Circumference 58 inches    Hip Circumference 65 inches    Body fat --   E4     Information for Trainer   Goals --   Lose 10-15 pounds by end of program; more active, establish exercise routine   Current Exercise none    Orthopedic Concerns --   back and knee pain   Pertinent Medical History --   HTN, diabetes, Afib, COPD, CKD, HLD   Current Barriers --   son will be bringing her to class   Medications that affect exercise Beta blocker      Timed Up and Go (TUGS)   Timed Up and Go Low risk <9 seconds      Mobility and Daily Activities   I find it easy to walk up or down two or more flights of stairs. 1    I have no trouble taking out the trash. 4    I do housework such as vacuuming and dusting on my own without difficulty. 1    I can easily lift a gallon of milk (8lbs). 4    I can easily walk a mile. 1    I have no trouble reaching into high cupboards or reaching down to pick up something from the floor. 2    I do not have trouble doing out-door work such as Loss adjuster, chartered, raking leaves, or gardening. 1      Mobility and Daily Activities   I feel younger than my age. 1    I feel independent. 2    I feel energetic. 2    I live an active life.  1    I feel strong. 2    I feel healthy. 2    I feel active as other people my age. 1      How fit and strong are you.   Fit and Strong Total Score 25            Past Medical History:   Diagnosis Date   Adhesive capsulitis    Aortic valve disease    Arthritis    Asthma    Back pain    CKD (chronic kidney disease) stage 3, GFR 30-59 ml/min (HCC)    Constipation    COPD (chronic obstructive pulmonary disease) (HCC)    Diabetes mellitus without complication (HCC)    Diuretic-induced hypokalemia    Hypercholesterolemia    Hypertension    IT band syndrome    Morbid obesity (HCC)    Palpitations    Periumbilical pain    Postmenopausal estrogen deficiency    Prediabetes    S/P TAVR (transcatheter aortic valve replacement) 12/26/2020   Ventral hernia    Past Surgical History:  Procedure Laterality Date   BIOPSY  05/01/2021   Procedure: BIOPSY;  Surgeon: Lajuan Pila, MD;  Location: [facility];  Service:  Gastroenterology;;   CESAREAN SECTION  1989   COLONOSCOPY     COLONOSCOPY WITH PROPOFOL  N/A 05/01/2021   Procedure: COLONOSCOPY WITH PROPOFOL ;  Surgeon: Lajuan Pila, MD;  Location: [facility];  Service: Gastroenterology;  Laterality: N/A;   ESOPHAGOGASTRODUODENOSCOPY (EGD) WITH PROPOFOL  N/A 05/01/2021   Procedure: ESOPHAGOGASTRODUODENOSCOPY (EGD) WITH PROPOFOL ;  Surgeon: Lajuan Pila, MD;  Location: [facility];  Service: Gastroenterology;  Laterality: N/A;   INTRAOPERATIVE TRANSTHORACIC ECHOCARDIOGRAM N/A 12/26/2020   Procedure: INTRAOPERATIVE TRANSTHORACIC ECHOCARDIOGRAM;  Surgeon: Kyra Phy, MD;  Location: [facility];  Service: Open Heart Surgery;  Laterality: N/A;   MALONEY DILATION  05/01/2021   Procedure: Londa Rival DILATION;  Surgeon: Lajuan Pila, MD;  Location: [facility];  Service: Gastroenterology;;   POLYPECTOMY  05/01/2021   Procedure: POLYPECTOMY;  Surgeon: Lajuan Pila, MD;  Location: [facility];  Service: Gastroenterology;;   RIGHT/LEFT HEART CATH AND CORONARY ANGIOGRAPHY N/A 11/24/2020   Procedure: RIGHT/LEFT HEART CATH AND CORONARY ANGIOGRAPHY;  Surgeon: Arty Binning, MD;  Location: [facility] INVASIVE CV LAB;  Service:  Cardiovascular;  Laterality: N/A;   TONSILLECTOMY AND ADENOIDECTOMY  1960   TRANSCATHETER AORTIC VALVE REPLACEMENT, TRANSFEMORAL N/A 12/26/2020   Procedure: TRANSCATHETER AORTIC VALVE REPLACEMENT, TRANSFEMORAL;  Surgeon: Kyra Phy, MD;  Location: [facility];  Service: Open Heart Surgery;  Laterality: N/A;   Social History   Tobacco Use  Smoking Status Former   Current packs/day: 0.00   Average packs/day: 1 pack/day for 38.0 years (38.0 ttl pk-yrs)   Types: Cigarettes   Start date: 70   Quit date: 2013   Years since quitting: 12.3   Passive exposure: Never  Smokeless Tobacco Never  To begin PREP class at [employer] on May 12, every M/W at 12 noon.  Jim Motts 05/19/2023, 11:31 AM

## 2023-05-20 ENCOUNTER — Telehealth: Payer: Self-pay

## 2023-05-20 NOTE — Telephone Encounter (Signed)
 Copied from CRM [id number]. Topic: Appointments - Appointment Cancel/Reschedule >> May 19, 2023  1:01 PM Howard Macho wrote: Patient/patient representative is calling to cancel or reschedule an appointment. Refer to attachments for appointment information.   Patient called stating she need to change her 5/19 appointment with Clarnce Crow CB [phone]

## 2023-05-20 NOTE — Telephone Encounter (Signed)
 Forwarding message below

## 2023-05-21 ENCOUNTER — Encounter (HOSPITAL_COMMUNITY): Payer: Self-pay

## 2023-05-25 ENCOUNTER — Other Ambulatory Visit: Payer: Self-pay | Admitting: Gastroenterology

## 2023-05-26 ENCOUNTER — Other Ambulatory Visit (HOSPITAL_COMMUNITY): Payer: Self-pay

## 2023-05-26 MED ORDER — OMEPRAZOLE 20 MG PO CPDR
20.0000 mg | DELAYED_RELEASE_CAPSULE | Freq: Every day | ORAL | 11 refills | Status: AC
  Filled 2023-05-26: qty 30, 30d supply, fill #0
  Filled 2023-06-26: qty 30, 30d supply, fill #1
  Filled 2023-07-23: qty 30, 30d supply, fill #2
  Filled 2023-08-21: qty 30, 30d supply, fill #3
  Filled 2023-09-20: qty 30, 30d supply, fill #4
  Filled 2023-10-17: qty 30, 30d supply, fill #5
  Filled 2023-11-16: qty 30, 30d supply, fill #6
  Filled 2023-12-16: qty 30, 30d supply, fill #7
  Filled 2024-01-12: qty 30, 30d supply, fill #8
  Filled 2024-02-11: qty 30, 30d supply, fill #9

## 2023-05-26 NOTE — Progress Notes (Signed)
 [facility] PREP Weekly Session  Patient Details  Name: LIDIA DONALDS MRN: [id number] Date of Birth: 12-23-1955 Age: 68 y.o. PCP: Catheryn Cluck, MD  There were no vitals filed for this visit.   [facility] Weekly seesion - 05/26/23 1300       [facility] "PREP" Location   [facility] "PREP" Location [facility]      Weekly Session   Topic Discussed Goal setting and welcome to the program   Introductions, review of notebooks, tour of facility, option to work out on cardio machine   Classes attended to date 1             Rickell Wiehe B Arielle Eber 05/26/2023, 1:58 PM

## 2023-05-28 ENCOUNTER — Telehealth: Payer: Self-pay

## 2023-05-28 ENCOUNTER — Encounter: Payer: Self-pay | Admitting: Family Medicine

## 2023-05-28 ENCOUNTER — Ambulatory Visit: Admitting: Family Medicine

## 2023-05-28 ENCOUNTER — Other Ambulatory Visit: Payer: Self-pay

## 2023-05-28 ENCOUNTER — Encounter: Payer: Self-pay | Admitting: Pharmacist

## 2023-05-28 ENCOUNTER — Other Ambulatory Visit (HOSPITAL_COMMUNITY): Payer: Self-pay

## 2023-05-28 VITALS — BP 118/74 | HR 73 | Temp 97.0°F | Resp 18 | Wt 364.4 lb

## 2023-05-28 DIAGNOSIS — I480 Paroxysmal atrial fibrillation: Secondary | ICD-10-CM | POA: Diagnosis not present

## 2023-05-28 DIAGNOSIS — M170 Bilateral primary osteoarthritis of knee: Secondary | ICD-10-CM

## 2023-05-28 DIAGNOSIS — E669 Obesity, unspecified: Secondary | ICD-10-CM | POA: Diagnosis not present

## 2023-05-28 DIAGNOSIS — K760 Fatty (change of) liver, not elsewhere classified: Secondary | ICD-10-CM

## 2023-05-28 DIAGNOSIS — Z7985 Long-term (current) use of injectable non-insulin antidiabetic drugs: Secondary | ICD-10-CM

## 2023-05-28 DIAGNOSIS — E1169 Type 2 diabetes mellitus with other specified complication: Secondary | ICD-10-CM

## 2023-05-28 MED ORDER — APIXABAN 5 MG PO TABS
5.0000 mg | ORAL_TABLET | Freq: Two times a day (BID) | ORAL | 1 refills | Status: DC
  Filled 2023-05-28: qty 180, 90d supply, fill #0

## 2023-05-28 MED ORDER — SEMAGLUTIDE (2 MG/DOSE) 8 MG/3ML ~~LOC~~ SOPN
2.0000 mg | PEN_INJECTOR | SUBCUTANEOUS | 0 refills | Status: DC
  Filled 2023-05-28: qty 3, 28d supply, fill #0
  Filled 2023-05-29: qty 9, 84d supply, fill #0

## 2023-05-28 MED ORDER — APIXABAN 5 MG PO TABS
5.0000 mg | ORAL_TABLET | Freq: Two times a day (BID) | ORAL | 1 refills | Status: DC
  Filled 2023-05-28 – 2023-05-29 (×3): qty 180, 90d supply, fill #0
  Filled 2023-08-23: qty 180, 90d supply, fill #1

## 2023-05-28 NOTE — Patient Instructions (Addendum)
  VISIT SUMMARY: Today, you came in for a follow-up on your blood sugar management and knee discomfort. We discussed your current use of Ozempic  injections for diabetes, your participation in a new nutrition and exercise program, and your concerns about knee pain and medication supply for atrial fibrillation. We also reviewed your overall health maintenance, including chronic kidney disease, hyperlipidemia, and coronary artery disease.  YOUR PLAN: -TYPE 2 DIABETES MELLITUS: Type 2 diabetes is a condition where your body does not use insulin  properly, leading to high blood sugar levels. You are currently using Ozempic  injections, which you have tolerated well. We will increase your dose to 2 mg weekly after you finish your current supply. Continue with your nutrition and exercise program to help manage your blood sugar levels.  -OBESITY: Obesity is a condition characterized by excessive body fat. You have started a new nutrition and exercise program to help with weight loss. Continue with this program to aid in weight loss and improve your overall health.  -KNEE OSTEOARTHRITIS: Knee osteoarthritis is a condition where the cartilage in your knee joint wears down, causing pain and stiffness. You have Voltaren gel at home, which you should apply 2-4 times daily as needed for knee pain. Weight loss and increased physical activity are also recommended to help alleviate your knee pain.  -ATRIAL FIBRILLATION: Atrial fibrillation is an irregular and often rapid heart rate that can increase your risk of strokes and other heart-related complications. You are taking Eliquis  for this condition but are running low on your medication. We have sent an urgent referral to the pharmacy for assistance and a refill to bridge the gap until you receive help.  -CHRONIC KIDNEY DISEASE, STAGE 3B: Chronic kidney disease stage 3B means your kidneys are moderately to severely damaged and are not working as well as they should. We  will continue to monitor your kidney function as part of your overall health maintenance.  -HYPERLIPIDEMIA: Hyperlipidemia is a condition where you have high levels of fats (lipids) in your blood. We discussed managing this condition as part of your overall health maintenance.  -CORONARY ARTERY DISEASE: Coronary artery disease is a condition where the blood vessels supplying your heart are narrowed or blocked. We discussed managing this condition as part of your overall health maintenance.  INSTRUCTIONS: Please follow up with your pharmacist to ensure you do not run out of Eliquis . Continue with your nutrition and exercise program, and apply Voltaren gel to your knee as needed. We will increase your Ozempic  dose to 2 mg weekly after you finish your current supply. If you have any concerns or experience any new symptoms, please contact our office.

## 2023-05-28 NOTE — Progress Notes (Signed)
   05/28/2023  Patient ID: Rose Miller, female   DOB: 03/08/1955, 68 y.o.   MRN: [id number]  In basket message from PCP stating patient is running low on Eliquis  5mg  BID and having issues in regard to affordability of medication.  PAP application was mailed to patient's home earlier this month, but this program requires 3% of HHI to have been spent on medication costs prior to approval; and it is unlikely patient will meet this requirement.  Test claim reflects $47 copay for 30 day supply of $117 for 90 day supply.  Xarelto would be the same, and generic Pradaxa is not covered by the plan.  Contacting patient via MyChart message to make aware and see if she may qualify for [employer] Medicare Extra Help.  Patient could also contact Medicare plan for prescription payment plan options to assist with affordability.  Otherwise, the only cheaper medication option would be warfarin, which is not ideal.  Linn Rich, PharmD, DPLA

## 2023-05-28 NOTE — Telephone Encounter (Signed)
 Pharmacy Patient Advocate Encounter   Received notification from Patient Pharmacy that prior authorization for Ozempic  8 is required/requested.   Insurance verification completed.   The patient is insured through [facility] [employer] .   Per test claim: PA required; PA submitted to above mentioned insurance via CoverMyMeds Key/confirmation #/[id number] Status is pending

## 2023-05-28 NOTE — Progress Notes (Signed)
 Assessment & Plan   Assessment/Plan:    Assessment & Plan Type 2 diabetes mellitus Type 2 diabetes mellitus managed with Ozempic  injections. Reports some improvement but continues to experience increased appetite and weight gain. No side effects from Ozempic . Transitioned from Rybelsus  to Ozempic  due to higher efficacy at increased doses. - Increase Ozempic  to 2 mg weekly after current supply is finished. - Continue with nutrition and exercise program.  Obesity with metabolic dysfunction associated steatotic liver disease Obesity management with ongoing nutrition and exercise program. She has started a new program focusing on diet and exercise. - Continue with nutrition and exercise program to aid in weight loss.  Knee osteoarthritis Ongoing knee discomfort. Has not yet used Voltaren gel due to measurement concerns. Encouraged to apply a thin layer around the knee. Weight loss and increased physical activity are recommended to alleviate knee pain. - Instruct to apply Voltaren gel 2-4 times daily as needed for knee pain. - Encourage weight loss and increased physical activity to alleviate knee pain.  Atrial fibrillation Atrial fibrillation anticoagulation managed with Eliquis . Running low on Eliquis  and requires assistance with medication supply. A refill has been sent to bridge the gap until assistance is received. - Send urgent referral to pharmacy for medication assistance with Eliquis . - Send in a refill for Eliquis  5 mg bid to bridge the gap until assistance is received.  Hyperlipidemia Hyperlipidemia management discussed in the context of overall health maintenance.  Coronary artery disease Coronary artery disease management discussed in the context of overall health maintenance.      Medications Discontinued During This Encounter  Medication Reason   Semaglutide , 1 MG/DOSE, (OZEMPIC , 1 MG/DOSE,) 4 MG/3ML SOPN    Semaglutide  (RYBELSUS ) 14 MG TABS    apixaban  (ELIQUIS ) 5  MG TABS tablet Reorder   apixaban  (ELIQUIS ) 5 MG TABS tablet     Return in about 3 months (around 08/28/2023) for DM.        Subjective:   Encounter date: 05/28/2023  Rose EBBING is a 68 y.o. female who has Ventral hernia; IT band syndrome; Right shoulder pain; Periumbilical pain; Hyperlipidemia LDL goal <70; Class 3 severe obesity due to excess calories with body mass index (BMI) of 50.0 to 59.9 in adult; Type 2 diabetes mellitus with obesity (HCC); CKD stage 3 secondary to diabetes (HCC); Postmenopausal estrogen deficiency; Former smoker; Long-term use of aspirin  therapy; Hypertension associated with diabetes (HCC); Aortic valve disease; COPD (chronic obstructive pulmonary disease) (HCC); Pharyngoesophageal dysphagia; Family history of colon cancer in father; Diplopia; Allergic rhinitis; PAF (paroxysmal atrial fibrillation) (HCC); Otalgia of left ear; Ingrown nail of great toe of left foot; Vitamin D  deficiency; Low back pain; Chronic pain of both knees; Dyspnea on exertion; Coronary artery disease due to calcified coronary lesion; Aortic atherosclerosis (HCC); Abscess of lower back; and Kidney lesion on their problem list..   She  has a past medical history of Adhesive capsulitis, Aortic valve disease, Arthritis, Asthma, Back pain, CKD (chronic kidney disease) stage 3, GFR 30-59 ml/min (HCC), Constipation, COPD (chronic obstructive pulmonary disease) (HCC), Diabetes mellitus without complication (HCC), Diuretic-induced hypokalemia, Hypercholesterolemia, Hypertension, IT band syndrome, Morbid obesity (HCC), Palpitations, Periumbilical pain, Postmenopausal estrogen deficiency, Prediabetes, S/P TAVR (transcatheter aortic valve replacement) (12/26/2020), and Ventral hernia..   She presents with chief complaint of Medical Management of Chronic Issues (2 month follow up. Pt is fasting today. //HM due- eye exam ) and Knee Pain (Pt c/o of right and left knee pain; she is currently going to the [facility]  for exercise PT classes referred by cardiologist Maudine Sos MD)) .   Discussed the use of AI scribe software for clinical note transcription with the patient, who gave verbal consent to proceed.  History of Present Illness Rose Miller is a 68 year old female with diabetes and knee osteoarthritis who presents for follow-up on blood sugar management and knee discomfort.  She is following up on her blood sugar levels and the use of Ozempic  injections. She has completed three injections, with the second and third injections confirmed to have been administered correctly. There is a slight change in her condition, but she admits to eating more than she should, resulting in some weight gain. She was previously on Rybelsus  14 mg before switching to Ozempic  1 mg, which she has tolerated well without significant side effects.  She reports ongoing knee discomfort. She has not yet used the Voltaren gel she has at home, as she is unsure about the application process. She plans to bring it to the nurse at the [facility] for guidance. She inquires about using the gel on her back as well.  She is participating in a nutrition and exercise program, which she started on Monday. She is scheduled to begin the exercise component today. She has some anxiety about the program but is committed to attending.  She is currently taking Eliquis  for atrial fibrillation, twice daily, and is concerned about running out of medication. She plans to contact her pharmacist for assistance. She also takes Lasix  20 mg as needed for leg swelling, which she did not take today. Her right leg is usually the one that swells.  No chest pain, shortness of breath, or abdominal discomfort. Some swelling in her right leg.       Past Surgical History:  Procedure Laterality Date   BIOPSY  05/01/2021   Procedure: BIOPSY;  Surgeon: Lajuan Pila, MD;  Location: [facility];  Service: Gastroenterology;;   CESAREAN SECTION  1989    COLONOSCOPY     COLONOSCOPY WITH PROPOFOL  N/A 05/01/2021   Procedure: COLONOSCOPY WITH PROPOFOL ;  Surgeon: Lajuan Pila, MD;  Location: [facility];  Service: Gastroenterology;  Laterality: N/A;   ESOPHAGOGASTRODUODENOSCOPY (EGD) WITH PROPOFOL  N/A 05/01/2021   Procedure: ESOPHAGOGASTRODUODENOSCOPY (EGD) WITH PROPOFOL ;  Surgeon: Lajuan Pila, MD;  Location: [facility];  Service: Gastroenterology;  Laterality: N/A;   INTRAOPERATIVE TRANSTHORACIC ECHOCARDIOGRAM N/A 12/26/2020   Procedure: INTRAOPERATIVE TRANSTHORACIC ECHOCARDIOGRAM;  Surgeon: Kyra Phy, MD;  Location: [facility];  Service: Open Heart Surgery;  Laterality: N/A;   MALONEY DILATION  05/01/2021   Procedure: Londa Rival DILATION;  Surgeon: Lajuan Pila, MD;  Location: [facility];  Service: Gastroenterology;;   POLYPECTOMY  05/01/2021   Procedure: POLYPECTOMY;  Surgeon: Lajuan Pila, MD;  Location: [facility];  Service: Gastroenterology;;   RIGHT/LEFT HEART CATH AND CORONARY ANGIOGRAPHY N/A 11/24/2020   Procedure: RIGHT/LEFT HEART CATH AND CORONARY ANGIOGRAPHY;  Surgeon: Arty Binning, MD;  Location[facility] CV LAB;  Service: Cardiovascular;  Laterality: N/A;   TONSILLECTOMY AND ADENOIDECTOMY  1960   TRANSCATHETER AORTIC VALVE REPLACEMENT, TRANSFEMORAL N/A 12/26/2020   Procedure: TRANSCATHETER AORTIC VALVE REPLACEMENT, TRANSFEMORAL;  Surgeon: Kyra Phy, MD;  Location: [facility];  Service: Open Heart Surgery;  Laterality: N/A;    Outpatient Medications Prior to Visit  Medication Sig Dispense Refill   amLODipine  (NORVASC ) 10 MG tablet Take 1 tablet (10 mg total) by mouth daily. 90 tablet 3   amoxicillin  (AMOXIL ) 500 MG tablet TAKE 4 TABLETS 1  HOUR PRIORTO DENTAL PROCEDURES AND   CLEANINGS 12 tablet 6   Ascorbic Acid (VITAMIN C) 1000 MG tablet Take 1,000 mg by mouth daily.     atorvastatin  (LIPITOR) 10 MG tablet Take 1 tablet (10 mg total) by mouth daily. 90 tablet 3   azelastine  (ASTELIN ) 0.1 %  nasal spray Place 2 sprays into both nostrils 2 (two) times daily. Use in each nostril as directed 30 mL 12   Blood Glucose Monitoring Suppl (ONE TOUCH ULTRA 2) w/Device KIT Use as directed in the morning, at noon, and at bedtime. 1 kit 0   Blood Glucose Monitoring Suppl DEVI Use as directed. 1 each 0   Cholecalciferol 50 MCG (2000 UT) TABS Take 2,000 Units by mouth daily.     Coenzyme Q10 (CO Q-10) 100 MG CAPS Take 100 mg by mouth daily.      Cyanocobalamin (B-12) 500 MCG TABS Take 500 mcg by mouth daily.     diltiazem  (CARDIZEM ) 30 MG tablet Take 1 tablet (30 mg total) by mouth every 6 (six) hours as needed (fast heart rate, heart palpitations). 30 tablet 6   empagliflozin  (JARDIANCE ) 10 MG TABS tablet Take 1 tablet (10 mg total) by mouth daily before breakfast. 90 tablet 0   fluticasone  (FLONASE ) 50 MCG/ACT nasal spray Place 2 sprays into both nostrils daily. 16 g 6   furosemide  (LASIX ) 20 MG tablet Take 1 tablet (20 mg total) by mouth daily as needed (for lower extremity swelling). 30 tablet 4   Glucose Blood (BLOOD GLUCOSE TEST STRIPS) STRP 1 each by In Vitro route in the morning, at noon, and at bedtime. May substitute to any manufacturer covered by patient's insurance. 100 strip 0   glucose blood (ONETOUCH ULTRA TEST) test strip Use to check blood sugar up to three times daily 100 each 12   hydrALAZINE  (APRESOLINE ) 50 MG tablet Take 1 tablet (50 mg total) by mouth 3 (three) times daily. 270 tablet 3   ipratropium (ATROVENT ) 0.06 % nasal spray Place 2 sprays into both nostrils 4 (four) times daily. 15 mL 12   Lancet Device MISC Use in the morning, at noon, and at bedtime. 1 each 0   Lancets 33G MISC USE 1 each TO CHECK BLOOD GLUCOSE in the morning, at noon, and at bedtime. 100 each 0   losartan  (COZAAR ) 100 MG tablet Take 1 tablet (100 mg total) by mouth daily. Discontinue losartan /hctz 90 tablet 3   Menthol, Topical Analgesic, (BLUE-EMU MAXIMUM STRENGTH) 2.5 % LIQD Apply 1 application  topically daily as needed (pain).     metoprolol  succinate (TOPROL -XL) 25 MG 24 hr tablet Take 1 tablet (25 mg total) by mouth daily. 90 tablet 3   Olopatadine  HCl 0.2 % SOLN Apply 1 drop to eye in the morning and at bedtime. 2.5 mL 3   omeprazole  (PRILOSEC ) 20 MG capsule Take 1 capsule (20 mg total) by mouth daily. 30 capsule 11   spironolactone  (ALDACTONE ) 25 MG tablet Take 1 tablet (25 mg total) by mouth daily. 90 tablet 2   Tiotropium Bromide-Olodaterol (STIOLTO RESPIMAT ) 2.5-2.5 MCG/ACT AERS Inhale 2 puffs into the lungs daily. 4 g 3   Zinc 50 MG TABS Take 50 mg by mouth daily.     apixaban  (ELIQUIS ) 5 MG TABS tablet Take 1 tablet (5 mg total) by mouth 2 (two) times daily. 180 tablet 1   Semaglutide  (RYBELSUS ) 14 MG TABS Take by mouth.     Semaglutide , 1 MG/DOSE, (OZEMPIC , 1 MG/DOSE,) 4 MG/3ML SOPN  Inject 1 mg into the skin once a week.     No facility-administered medications prior to visit.    Family History  Problem Relation Age of Onset   Dementia Mother    Arthritis Mother    Cancer Mother        pancreatic   Hypertension Mother    Obesity Mother    Cancer Father        prostate and colon   Colon cancer Father    Heart disease Father    Thyroid  disease Father    Breast cancer Neg Hx     Social History   Socioeconomic History   Marital status: Single    Spouse name: Not on file   Number of children: 1   Years of education: Not on file   Highest education level: Not on file  Occupational History   Occupation: retired  Tobacco Use   Smoking status: Former    Current packs/day: 0.00    Average packs/day: 1 pack/day for 38.0 years (38.0 ttl pk-yrs)    Types: Cigarettes    Start date: 31    Quit date: 2013    Years since quitting: 12.3    Passive exposure: Never   Smokeless tobacco: Never  Vaping Use   Vaping status: Never Used  Substance and Sexual Activity   Alcohol use: Yes    Comment: socially   Drug use: No   Sexual activity: Not on file  Other  Topics Concern   Not on file  Social History Narrative   Not on file   Social Drivers of Health   Financial Resource Strain: Low Risk  (03/28/2023)   Overall Financial Resource Strain (CARDIA)    Difficulty of Paying Living Expenses: Not hard at all  Food Insecurity: No Food Insecurity (03/28/2023)   Hunger Vital Sign    Worried About Running Out of Food in the Last Year: Never true    Ran Out of Food in the Last Year: Never true  Transportation Needs: No Transportation Needs (03/28/2023)   PRAPARE - Administrator, Civil Service (Medical): No    Lack of Transportation (Non-Medical): No  Physical Activity: Inactive (03/28/2023)   Exercise Vital Sign    Days of Exercise per Week: 0 days    Minutes of Exercise per Session: 0 min  Stress: No Stress Concern Present (03/28/2023)   [employer] of Occupational Health - Occupational Stress Questionnaire    Feeling of Stress : Not at all  Social Connections: Socially Isolated (03/28/2023)   Social Connection and Isolation Panel [NHANES]    Frequency of Communication with Friends and Family: More than three times a week    Frequency of Social Gatherings with Friends and Family: Patient declined    Attends Religious Services: Never    Database administrator or Organizations: No    Attends Banker Meetings: Never    Marital Status: Never married  Intimate Partner Violence: Not At Risk (03/28/2023)   Humiliation, Afraid, Rape, and Kick questionnaire    Fear of Current or Ex-Partner: No    Emotionally Abused: No    Physically Abused: No    Sexually Abused: No  Objective:  Physical Exam: BP 118/74 (BP Location: Left Arm, Patient Position: Sitting, Cuff Size: Large)   Pulse 73   Temp (!) 97 F (36.1 C) (Temporal)   Resp 18   Wt (!) 364 lb 6.4 oz (165.3 kg)   SpO2 97%   BMI 52.29 kg/m   Wt Readings from Last 3  Encounters:  05/28/23 (!) 364 lb 6.4 oz (165.3 kg)  05/19/23 (!) 364 lb 6.4 oz (165.3 kg)  05/12/23 (!) 366 lb 3.2 oz (166.1 kg)    Physical Exam GENERAL: Alert, cooperative, well developed, no acute distress. HEENT: Normocephalic, normal oropharynx, moist mucous membranes. CHEST: Clear to auscultation bilaterally, no wheezes, rhonchi, or crackles. CARDIOVASCULAR: Normal heart rate and rhythm, S1 and S2 normal without murmurs. ABDOMEN: Soft, non-tender, non-distended, without organomegaly, normal bowel sounds. EXTREMITIES: Right leg mild swelling, no cyanosis. NEUROLOGICAL: Cranial nerves grossly intact, moves all extremities without gross motor or sensory deficit.   Physical Exam Constitutional:      Appearance: She is obese.  HENT:     Head: Normocephalic and atraumatic.     Nose: Nose normal. No congestion.  Eyes:     General: No scleral icterus.    Extraocular Movements: Extraocular movements intact.  Cardiovascular:     Rate and Rhythm: Normal rate and regular rhythm.     Pulses: Normal pulses.     Heart sounds: Murmur heard.  Pulmonary:     Effort: Pulmonary effort is normal. No respiratory distress.     Breath sounds: Normal breath sounds.  Abdominal:     General: Abdomen is flat. Bowel sounds are normal.     Palpations: Abdomen is soft.  Musculoskeletal:        General: Normal range of motion.     Right knee: Normal range of motion. No tenderness.     Left knee: Normal range of motion. No tenderness.  Lymphadenopathy:     Cervical: No cervical adenopathy.  Skin:    General: Skin is warm and dry.     Findings: No abscess.  Neurological:     General: No focal deficit present.     Mental Status: She is alert and oriented to person, place, and time. Mental status is at baseline.  Psychiatric:        Mood and Affect: Mood normal.        Behavior: Behavior normal.        Thought Content: Thought content normal.        Judgment: Judgment normal.     MR ABDOMEN  WWO CONTRAST Result Date: 05/15/2023 CLINICAL DATA:  Characterize indeterminate right renal lesion identified by prior CT EXAM: MRI ABDOMEN WITHOUT AND WITH CONTRAST TECHNIQUE: Multiplanar multisequence MR imaging of the abdomen was performed both before and after the administration of intravenous contrast. CONTRAST:  10 mL Vueway  gadolinium contrast IV COMPARISON:  CT abdomen pelvis, 03/14/2023 FINDINGS: Lower chest: No acute abnormality. Hepatobiliary: No solid liver abnormality is seen. Hepatic steatosis. Small gallstones. No gallbladder wall thickening, or biliary dilatation. Pancreas: Unremarkable. No pancreatic ductal dilatation or surrounding inflammatory changes. Spleen: Normal in size without significant abnormality. Adrenals/Urinary Tract: Adrenal glands are unremarkable. Lesion of interest arising from the anterior midportion right kidney is a benign simple fluid signal cyst by MR (series 12, image 20). No solid renal lesion or suspicious contrast enhancement. Kidneys are otherwise normal, without obvious renal calculi, solid lesion, or hydronephrosis. Stomach/Bowel: Stomach is within normal limits. No evidence of bowel wall thickening, distention, or inflammatory changes. Vascular/Lymphatic:  Aortic atherosclerosis. No enlarged abdominal lymph nodes. Other: Fat containing umbilical hernia.  No ascites. Musculoskeletal: No acute or significant osseous findings. IMPRESSION: 1. Lesion of interest arising from the anterior midportion right kidney is a benign simple fluid signal cyst by MR. No solid renal lesion or suspicious contrast enhancement. No further follow-up or characterization is required for this benign Bosniak category I cyst. 2. Hepatic steatosis. 3. Cholelithiasis. Aortic Atherosclerosis (ICD10-I70.0). Electronically Signed   By: Fredricka Jenny M.D.   On: 05/15/2023 21:51   ECHOCARDIOGRAM COMPLETE Result Date: 04/07/2023    ECHOCARDIOGRAM REPORT   Patient Name:   Rose Miller Date of Exam:  04/07/2023 Medical Rec #:  [id number]        Height:       70.0 in Accession #:    [id number]       Weight:       359.8 lb Date of Birth:  [birth year]       BSA:          2.679 m Patient Age:    72 years         BP:           126/82 mmHg Patient Gender: F                HR:           74 bpm. Exam Location:  [address] Procedure: 2D Echo, Cardiac Doppler and Color Doppler (Both Spectral and Color            Flow Doppler were utilized during procedure). Indications:    Z95.2 Status post TAVR  History:        Patient has prior history of Echocardiogram examinations, most                 recent 12/27/2022. TAVR-11mm Sapien 3 Ultra Resilia, COPD; Risk                 Factors:Hypertension, Dyslipidemia and Diabetes.                 Aortic Valve: 26 mm Sapien prosthetic, stented (TAVR) valve is                 present in the aortic position. Procedure Date: 12/26/2020.  Sonographer:    Brigid Canada RDCS Referring Phys: [id number] KATHRYN R Wei Newbrough IMPRESSIONS  1. Left ventricular ejection fraction, by estimation, is 65 to 70%. The left ventricle has normal function. The left ventricle has no regional wall motion abnormalities. There is moderate concentric left ventricular hypertrophy. Left ventricular diastolic parameters are indeterminate.  2. Right ventricular systolic function is normal. The right ventricular size is normal. Tricuspid regurgitation signal is inadequate for assessing PA pressure.  3. Left atrial size was mildly dilated.  4. The mitral valve is normal in structure. Trivial mitral valve regurgitation. No evidence of mitral stenosis.  5. The aortic valve has been repaired/replaced. Aortic valve regurgitation is not visualized. There is a 26 mm Sapien prosthetic (TAVR) valve present in the aortic position. Procedure Date: 12/26/2020. Echo findings are consistent with normal structure and function of the aortic valve prosthesis. Aortic valve mean gradient measures 12.8 mmHg.  6. Aortic dilatation  noted. There is borderline dilatation of the ascending aorta, measuring 39 mm.  7. The inferior vena cava is normal in size with greater than 50% respiratory variability, suggesting right atrial pressure of 3 mmHg. Comparison(s): Prior images reviewed side by side. Changes from prior study are noted.  TAVR gradient improved from 23 to 12.8 mmHg. FINDINGS  Left Ventricle: Left ventricular ejection fraction, by estimation, is 65 to 70%. The left ventricle has normal function. The left ventricle has no regional wall motion abnormalities. The left ventricular internal cavity size was normal in size. There is  moderate concentric left ventricular hypertrophy. Left ventricular diastolic parameters are indeterminate. Right Ventricle: The right ventricular size is normal. Right vetricular wall thickness was not well visualized. Right ventricular systolic function is normal. Tricuspid regurgitation signal is inadequate for assessing PA pressure. Left Atrium: Left atrial size was mildly dilated. Right Atrium: Right atrial size was normal in size. Pericardium: There is no evidence of pericardial effusion. Mitral Valve: The mitral valve is normal in structure. Trivial mitral valve regurgitation. No evidence of mitral valve stenosis. Tricuspid Valve: The tricuspid valve is grossly normal. Tricuspid valve regurgitation is not demonstrated. No evidence of tricuspid stenosis. The aortic valve has been repaired/replaced. Aortic valve regurgitation is not visualized. There is a 26 mm Sapien prosthetic, stented (TAVR) valve present in the aortic position. Procedure Date: 12/26/2020. Echo findings are consistent with normal structure and function of the aortic valve prosthesis. Pulmonic Valve: The pulmonic valve was not well visualized. Pulmonic valve regurgitation is not visualized. Aorta: Aortic dilatation noted. There is borderline dilatation of the ascending aorta, measuring 39 mm. Venous: The inferior vena cava is normal in size  with greater than 50% respiratory variability, suggesting right atrial pressure of 3 mmHg. IAS/Shunts: The atrial septum is grossly normal.  LEFT VENTRICLE PLAX 2D LVIDd:         4.80 cm Diastology LVIDs:         2.90 cm LV e' medial:    6.15 cm/s LV PW:         1.75 cm LV E/e' medial:  17.2 LV IVS:        1.71 cm LV e' lateral:   4.95 cm/s                        LV E/e' lateral: 21.4  RIGHT VENTRICLE             IVC RV Basal diam:  3.25 cm     IVC diam: 1.20 cm RV S prime:     16.90 cm/s TAPSE (M-mode): 2.7 cm LEFT ATRIUM             Index        RIGHT ATRIUM           Index LA diam:        4.70 cm 1.75 cm/m   RA Area:     11.00 cm LA Vol (A2C):   44.9 ml 16.76 ml/m  RA Volume:   24.00 ml  8.96 ml/m LA Vol (A4C):   72.6 ml 27.10 ml/m LA Biplane Vol: 57.6 ml 21.50 ml/m  AORTIC VALVE AV Vmax:           240.20 cm/s AV Vmean:          165.200 cm/s AV VTI:            0.539 m AV Peak Grad:      23.1 mmHg AV Mean Grad:      12.8 mmHg LVOT Vmax:         129.67 cm/s LVOT Vmean:        86.733 cm/s LVOT VTI:          0.307 m LVOT/AV VTI ratio: 0.57  AORTA  Ao Root diam: 3.50 cm Ao Asc diam:  3.90 cm MITRAL VALVE MV Area (PHT): 2.93 cm     SHUNTS MV Decel Time: 259 msec     Systemic VTI: 0.31 m MV E velocity: 106.00 cm/s MV A velocity: 109.50 cm/s MV E/A ratio:  0.97 Sheryle Donning MD Electronically signed by Sheryle Donning MD Signature Date/Time: 04/07/2023/5:51:34 PM    Final     Recent Results (from the past 2160 hours)  POCT glycosylated hemoglobin (Hb A1C)     Status: Abnormal   Collection Time: 03/17/23 10:52 AM  Result Value Ref Range   Hemoglobin A1C 5.8 (A) 4.0 - 5.6 %   HbA1c POC (<> result, manual entry)     HbA1c, POC (prediabetic range)     HbA1c, POC (controlled diabetic range)    WOUND CULTURE     Status: None   Collection Time: 03/17/23 11:46 AM   Specimen: Wound  Result Value Ref Range   MICRO NUMBER: [id number]    SPECIMEN QUALITY: Adequate    SOURCE: WOUND (SITE NOT  SPECIFIED)    STATUS: FINAL    GRAM STAIN:      Many White blood cells seen Few epithelial cells No organisms seen   RESULT: No Growth   ECHOCARDIOGRAM COMPLETE     Status: None   Collection Time: 04/07/23 10:19 AM  Result Value Ref Range   Area-P 1/2 2.93 cm2   S' Lateral 2.90 cm   Ao pk vel 2.40 m/s   AV Mean grad 12.8 mmHg   AV Peak grad 23.1 mmHg   Est EF 65 - 70%   Pulmonary function test     Status: None   Collection Time: 04/10/23  9:28 AM  Result Value Ref Range   FVC-Pre 2.83 L   FVC-%Pred-Pre 74 %   FVC-Post 2.77 L   FVC-%Pred-Post 73 %   FVC-%Change-Post -2 %   FEV1-Pre 2.44 L   FEV1-%Pred-Pre 84 %   FEV1-Post 2.44 L   FEV1-%Pred-Post 84 %   FEV1-%Change-Post 0 %   FEV6-Pre 2.83 L   FEV6-%Pred-Pre 77 %   FEV6-Post 2.77 L   FEV6-%Pred-Post 75 %   FEV6-%Change-Post -2 %   Pre FEV1/FVC ratio 86 %   FEV1FVC-%Pred-Pre 113 %   Post FEV1/FVC ratio 88 %   FEV1FVC-%Change-Post 2 %   Pre FEV6/FVC Ratio 100 %   FEV6FVC-%Pred-Pre 104 %   Post FEV6/FVC ratio 100 %   FEV6FVC-%Pred-Post 104 %   FEF 25-75 Pre 3.07 L/sec   FEF2575-%Pred-Pre 133 %   FEF 25-75 Post 3.31 L/sec   FEF2575-%Pred-Post 143 %   FEF2575-%Change-Post 7 %   RV 2.07 L   RV % pred 84 %   TLC 4.99 L   TLC % pred 83 %   DLCO unc 22.31 ml/min/mmHg   DLCO unc % pred 93 %   DLCO cor 22.31 ml/min/mmHg   DLCO cor % pred 93 %   DL/VA 4.78 ml/min/mmHg/L   DL/VA % pred 295 %        Carnell Christian, MD, MS

## 2023-05-29 ENCOUNTER — Other Ambulatory Visit: Payer: Self-pay

## 2023-05-29 ENCOUNTER — Other Ambulatory Visit (HOSPITAL_COMMUNITY): Payer: Self-pay

## 2023-05-29 NOTE — Progress Notes (Signed)
   05/29/2023  Patient ID: Rose Miller, female   DOB: 03-24-1955, 69 y.o.   MRN: [id number]  Subjective/Objective: Telephone visit to assist with affordability of Eliquis  and follow-up on management of chronic conditions  Medication Access/Affordability -Patient prescribed Eliquis  5mg  BID, and first fill this year is going to cost almost $500 based on deductible of $450 and copay of $47 -Patient does not qualify for PAP based on not meeting OOP requirement at this point -Does not qualify for LIS Medicare Extra Help, and no [facility] Norberta Beans is available -Dabigatran is not covered by plan, and Xarelto copay would be the same as Eliquis   Diabetes -Current medications:  Ozempic  1mg  weekly, Jardiance  10mg  daily -Patient recently switched to Ozempic  1mg  weekly from Rybelsus  14 mg daily for additional weight loss benefit -Tolerating Ozempic  well with no adverse side effects -Patient has not experienced additional weight loss but is noticing decrease in appetite; but she also has only had 2 doses of Ozempic  1mg  at this point -Receives Jardiance  through [employer] PAP and Ozempic  through [facility] PAP -Patient does monitor home FBG daily, and values are averaging 95-120 -A1c 5.8% on 3/3 -Does not endorse s/sx of hypoglycemia or hyperglycemia  Hypertension -Current medications:  losartan  100mg  daily, metoprolol  xl 25mg  daily, spironolactone  25mg  daily, hydralazine  50mg  TID, furosemide  20mg  daily as needed (rarely), diltiazem  30mg  q6h prn (rarely), amlodipine  10mg  daily -Patient monitors home BP regularly, and values average 118/64 -Does not endorse any s/sx of hypotension or hypertension  Hyperlipidemia -Current medications:  atorvastatin  10mg  daily -Lipid panel 12/16/2022 WNL  Assessment/Plan:  Medication Access/Affordability -Patient will contact insurance to inquire about prescription payment plan  Diabetes -Currently controlled -Continue current regimen at this time -Plan to increase  Ozempic  to 2mg  weekly after completing Ozempic  1mg  on hand as long as patient continues to tolerate well and additional weight loss benefit desired -Continue to monitor FBG daily  Hypertension -Currently controlled -Continue current regimen and regular monitoring of BP  Hyperlipidemia -Currently controlled -Continue current regimen and regular follow-up with PCP   Follow-up: 8/7  Linn Rich, PharmD, DPLA

## 2023-06-02 ENCOUNTER — Other Ambulatory Visit: Payer: Self-pay

## 2023-06-02 NOTE — Progress Notes (Signed)
 [facility]  Patient Details  Name: Rose Miller MRN: [id number] Date of Birth: [birth year] Age: 68 y.o. PCP: Catheryn Cluck, MD  Vitals:   06/02/23 1352  Weight: (!) 365 lb (165.6 kg)     [facility] Weekly seesion - 06/02/23 1300       [facility]PREP[facility] Location   [facility]      Weekly Session   Topic Discussed Importance of resistance training;Other ways to be active   Goal: work up to 150 mins/cardio/week; strength training start at twice/wk work up to 3 times a wk for 20-40 minutes   Classes attended to date 3             Analyse Angst B Chandria Rookstool 06/02/2023, 1:54 PM

## 2023-06-02 NOTE — Telephone Encounter (Signed)
 Received Patient portion of PAP application for Eliquis  (BMS) no OOP statements or EOB at this time given, so will wait on those.

## 2023-06-02 NOTE — Telephone Encounter (Signed)
 Pt was approved through 05/28/2023-05/27/2024.

## 2023-06-03 ENCOUNTER — Other Ambulatory Visit (HOSPITAL_COMMUNITY): Payer: Self-pay

## 2023-06-03 ENCOUNTER — Other Ambulatory Visit: Payer: Self-pay

## 2023-06-03 NOTE — Telephone Encounter (Signed)
 Pharmacy Patient Advocate Encounter  Received notification from [employer] that Prior Authorization for Ozempic  8 has been APPROVED from 05/28/23 to 05/27/24. Ran test claim, Copay is $47.00. This test claim was processed through [facility]- copay amounts may vary at other pharmacies due to pharmacy/plan contracts, or as the patient moves through the different stages of their insurance plan.   PA #/Case ID/Reference #: [id number]

## 2023-06-04 ENCOUNTER — Ambulatory Visit: Admitting: Skilled Nursing Facility1

## 2023-06-04 DIAGNOSIS — E1142 Type 2 diabetes mellitus with diabetic polyneuropathy: Secondary | ICD-10-CM | POA: Diagnosis not present

## 2023-06-04 DIAGNOSIS — B351 Tinea unguium: Secondary | ICD-10-CM | POA: Diagnosis not present

## 2023-06-04 DIAGNOSIS — I739 Peripheral vascular disease, unspecified: Secondary | ICD-10-CM | POA: Diagnosis not present

## 2023-06-04 DIAGNOSIS — R600 Localized edema: Secondary | ICD-10-CM | POA: Diagnosis not present

## 2023-06-04 DIAGNOSIS — M792 Neuralgia and neuritis, unspecified: Secondary | ICD-10-CM | POA: Diagnosis not present

## 2023-06-04 DIAGNOSIS — M2041 Other hammer toe(s) (acquired), right foot: Secondary | ICD-10-CM | POA: Diagnosis not present

## 2023-06-05 ENCOUNTER — Other Ambulatory Visit: Payer: Self-pay

## 2023-06-05 NOTE — Patient Outreach (Signed)
 Complex Care Management   Visit Note  06/05/2023  Name:  Rose Miller MRN: [id number] DOB: 25-Sep-1955  Situation: Referral received for Complex Care Management related to Diabetes with Complications, Atrial Fibrillation, and HTN I obtained verbal consent from Patient.  Visit completed with patient  on the phone  Background:   Past Medical History:  Diagnosis Date   Adhesive capsulitis    Aortic valve disease    Arthritis    Asthma    Back pain    CKD (chronic kidney disease) stage 3, GFR 30-59 ml/min (HCC)    Constipation    COPD (chronic obstructive pulmonary disease) (HCC)    Diabetes mellitus without complication (HCC)    Diuretic-induced hypokalemia    Hypercholesterolemia    Hypertension    IT band syndrome    Morbid obesity (HCC)    Palpitations    Periumbilical pain    Postmenopausal estrogen deficiency    Prediabetes    S/P TAVR (transcatheter aortic valve replacement) 12/26/2020   Ventral hernia     Assessment: Patient Reported Symptoms:  Cognitive Cognitive Status: Alert and oriented to person, place, and time      Neurological Neurological Review of Symptoms: No symptoms reported    HEENT HEENT Symptoms Reported: No symptoms reported      Cardiovascular Cardiovascular Symptoms Reported: No symptoms reported Does patient have uncontrolled Hypertension?: Yes Is patient checking Blood Pressure at home?: Yes Patient's Recent BP reading at home: 131/70 Cardiovascular Conditions: Dysrhythmia, Hypertension, Valvular disease (TAVR 2022) Cardiovascular Management Strategies: Routine screening, Medication therapy, Weight management, Medical device Do You Have a Working Readable Scale?: Yes Cardiovascular Self-Management Outcome: 4 (good)  Respiratory Respiratory Symptoms Reported: No symptoms reported Respiratory Conditions: COPD  Endocrine Patient reports the following symptoms related to hypoglycemia or hyperglycemia : No symptoms reported Is patient  diabetic?: Yes Is patient checking blood sugars at home?: Yes Endocrine Conditions: Diabetes Endocrine Management Strategies: Adequate rest, Medical device, Weight management, Medication therapy, Routine screening, Diet modification, Exercise Endocrine Self-Management Outcome: 4 (good)  Gastrointestinal Gastrointestinal Symptoms Reported: No symptoms reported      Genitourinary Genitourinary Symptoms Reported: No symptoms reported    Integumentary Integumentary Symptoms Reported: No symptoms reported, Wound Additional Integumentary Details: has wound on right big toe,  seeing podiatry Skin Conditions: Wound Skin Management Strategies: Routine screening, Dressing changes  Musculoskeletal Musculoskelatal Symptoms Reviewed: Difficulty walking Additional Musculoskeletal Details: bilareral knee pain, trying to lose weight, will discuss ortho referral with PCP Musculoskeletal Conditions: Joint pain Musculoskeletal Management Strategies: Adequate rest, Medication therapy, Routine screening, Weight management Falls in the past year?: No Patient at Risk for Falls Due to: Impaired mobility Fall risk Follow up: Falls prevention discussed  Psychosocial Psychosocial Symptoms Reported: No symptoms reported     Quality of Family Relationships: helpful, involved, supportive Do you feel physically threatened by others?: No      05/28/2023   10:50 AM  Depression screen PHQ 2/9  Decreased Interest 0  Down, Depressed, Hopeless 0  PHQ - 2 Score 0    Vitals:   06/05/23 1527  BP: 131/70    Medications Reviewed Today     Reviewed by Clarnce Crow, RN (Registered Nurse) on 06/05/23 at 1538  Med List Status: <None>   Medication Order Taking? Sig Documenting Provider Last Dose Status Informant  amLODipine  (NORVASC ) 10 MG tablet [id number] Yes Take 1 tablet (10 mg total) by mouth daily. Catheryn Cluck, MD Taking Active   amoxicillin  (AMOXIL ) 500 MG tablet [id number]  TAKE  4 TABLETS 1 HOUR PRIORTO  DENTAL PROCEDURES AND   CLEANINGS Ardia Kraft, PA-C  Active   apixaban  (ELIQUIS ) 5 MG TABS tablet [id number] Yes Take 1 tablet (5 mg total) by mouth 2 (two) times daily. Catheryn Cluck, MD Taking Active   Ascorbic Acid (VITAMIN C) 1000 MG tablet [id number] Yes Take 1,000 mg by mouth daily. [provider] Taking Active Self  atorvastatin  (LIPITOR) 10 MG tablet [id number] Yes Take 1 tablet (10 mg total) by mouth daily. Catheryn Cluck, MD Taking Active   azelastine  (ASTELIN ) 0.1 % nasal spray [id number] Yes Place 2 sprays into both nostrils 2 (two) times daily. Use in each nostril as directed Lorane Rocker, PA-C Taking Active   Blood Glucose Monitoring Suppl (ONE TOUCH ULTRA 2) w/Device KIT [id number] Yes Use as directed in the morning, at noon, and at bedtime. Catheryn Cluck, MD Taking Active   Blood Glucose Monitoring Suppl DEVI [id number] Yes Use as directed. Catheryn Cluck, MD Taking Active   Cholecalciferol 50 MCG [phone]) TABS [id number] Yes Take 2,000 Units by mouth daily. [provider] Taking Active Self  Coenzyme Q10 (CO Q-10) 100 MG CAPS [id number] Yes Take 100 mg by mouth daily.  [provider] Taking Active Self  Cyanocobalamin (B-12) 500 MCG TABS [id number] Yes Take 500 mcg by mouth daily. [provider] Taking Active Self  diltiazem  (CARDIZEM ) 30 MG tablet [id number] Yes Take 1 tablet (30 mg total) by mouth every 6 (six) hours as needed (fast heart rate, heart palpitations). Arty Binning, MD Taking Active            Med Note Verdia Glad, [place] R   Thu Apr 10, 2023 10:54 AM)    empagliflozin  (JARDIANCE ) 10 MG TABS tablet [id number] Yes Take 1 tablet (10 mg total) by mouth daily before breakfast. Tonna Frederic, MD Taking Active            Med Note Verdia Glad, [place] R   Thu Apr 10, 2023 10:54 AM)    fluticasone  (FLONASE ) 50 MCG/ACT nasal spray [id number] Yes Place 2 sprays into both nostrils daily. Webb, Padonda B, FNP Taking Active    furosemide  (LASIX ) 20 MG tablet [id number] Yes Take 1 tablet (20 mg total) by mouth daily as needed (for lower extremity swelling). Sonny Dust, MD Taking Active            Med Note Verdia Glad, [place] R   Thu Apr 10, 2023 10:54 AM)    glucose blood (ONETOUCH ULTRA TEST) test strip [id number] Yes Use to check blood sugar up to three times daily Catheryn Cluck, MD Taking Active   hydrALAZINE  (APRESOLINE ) 50 MG tablet [id number] Yes Take 1 tablet (50 mg total) by mouth 3 (three) times daily. Arnoldo Lapping, MD Taking Active   ipratropium (ATROVENT ) 0.06 % nasal spray [id number] Yes Place 2 sprays into both nostrils 4 (four) times daily. Orelia Binet, MD Taking Active   Lancet Device MISC [id number] Yes Use in the morning, at noon, and at bedtime. Catheryn Cluck, MD Taking Active   losartan  (COZAAR ) 100 MG tablet [id number] Yes Take 1 tablet (100 mg total) by mouth daily. Discontinue losartan Alaine Alken, MD Taking Active   Menthol, Topical Analgesic, (BLUE-EMU MAXIMUM STRENGTH) 2.5 % LIQD [id number] Yes Apply 1 application topically daily as needed (pain). [provider] Taking Active Self  metoprolol  succinate (TOPROL -XL) 25 MG 24 hr tablet [id number] Yes Take 1 tablet (25 mg total) by mouth daily. Kasandra Pain  B, MD Taking Active   Olopatadine  HCl 0.2 % SOLN [id number] Yes Apply 1 drop to eye in the morning and at bedtime. Orelia Binet, MD Taking Active   omeprazole  (PRILOSEC ) 20 MG capsule [id number] Yes Take 1 capsule (20 mg total) by mouth daily. Lajuan Pila, MD Taking Active   Semaglutide , 1 MG/DOSE, (OZEMPIC , 1 MG/DOSE,) 4 MG/3ML SOPN [id number] Yes Inject 1 mg into the skin once a week. [provider] Taking Active            Med Note Finley Hugh, CHERYL A   Thu May 29, 2023  3:14 PM) PAP  spironolactone  (ALDACTONE ) 25 MG tablet [id number] Yes Take 1 tablet (25 mg total) by mouth daily. Maudine Sos, MD Taking Active   Tiotropium  Bromide-Olodaterol (STIOLTO RESPIMAT ) 2.5-2.5 MCG/ACT AERS [id number] Yes Inhale 2 puffs into the lungs daily. Margaretann Sharper, MD Taking Active   Zinc 50 MG TABS [id number] Yes Take 50 mg by mouth daily. [provider] Taking Active Self            Recommendation:   PCP Follow-up Patient to schedule visit with PCP for A1C check and to discuss possible referral to orthopedic speciality for knee pain evaluation Follow Up Plan:   Telephone follow up appointment date/time:  06/24/23 at 2:00   Clarnce Crow BSN RN CCM [place]  [facility], [facility] RN Care Manager Direct Dial: [phone] Fax: [phone]

## 2023-06-05 NOTE — Patient Instructions (Signed)
 Visit Information  Thank you for taking time to visit with me today. Please don't hesitate to contact me if I can be of assistance to you before our next scheduled appointment.  Our next appointment is by telephone on 06/24/23 at 2:00 Please call the care guide team at [phone] if you need to cancel or reschedule your appointment.   Following is a copy of your care plan:   Goals Addressed             This Visit's Progress    VBCI RN Care Plan       Problems:  Chronic Disease Management support and education needs related to Atrial Fibrillation, DMII, and HTN  Goal: Over the next 30 days the Patient will attend all scheduled medical appointments: 07/08/23 Nutriion, 08/11/23 CVD, 08/20/23 Pulmonology as evidenced by chart review and patient report        demonstrate ongoing self health care management ability schedule diabetic eye exam and dental exam as evidenced by     take all medications exactly as prescribed and will call provider for medication related questions as evidenced by chart review and patient report     Interventions:   AFIB Interventions:   Counseled on increased risk of stroke due to Afib and benefits of anticoagulation for stroke prevention Reviewed importance of adherence to anticoagulant exactly as prescribed Counseled on bleeding risk associated with Eliquis  and importance of self-monitoring for signs/symptoms of bleeding Counseled on avoidance of NSAIDs due to increased bleeding risk with anticoagulants Counseled on importance of regular laboratory monitoring as prescribed Counseled on seeking medical attention after a head injury or if there is blood in the urine/stool Screening for signs and symptoms of depression related to chronic disease state  Assessed social determinant of health barriers   Diabetes Interventions: Assessed patient's understanding of A1c goal: <6.5% Provided education to patient about basic DM disease process Reviewed medications  with patient and discussed importance of medication adherence Counseled on importance of regular laboratory monitoring as prescribed Discussed plans with patient for ongoing care management follow up and provided patient with direct contact information for care management team Lab Results  Component Value Date   HGBA1C 5.8 (A) 03/17/2023    Hypertension Interventions: Last practice recorded BP readings:  BP Readings from Last 3 Encounters:  06/05/23 131/70  05/28/23 118/74  05/19/23 122/78   Most recent eGFR/CrCl:  Lab Results  Component Value Date   EGFR 44 (L) 04/12/2022    No components found for: "CRCL"  Evaluation of current treatment plan related to hypertension self management and patient's adherence to plan as established by provider Provided education to patient re: stroke prevention, s/s of heart attack and stroke Reviewed medications with patient and discussed importance of compliance Discussed plans with patient for ongoing care management follow up and provided patient with direct contact information for care management team Advised patient, providing education and rationale, to monitor blood pressure daily and record, calling PCP for findings outside established parameters Discussed complications of poorly controlled blood pressure such as heart disease, stroke, circulatory complications, vision complications, kidney impairment, sexual dysfunction Screening for signs and symptoms of depression related to chronic disease state  Assessed social determinant of health barriers  Patient Self-Care Activities:  Attend all scheduled provider appointments Attend church or other social activities Call pharmacy for medication refills 3-7 days in advance of running out of medications Call provider office for new concerns or questions  Perform all self care activities independently  Perform IADL's (  shopping, preparing meals, housekeeping, managing finances) independently Take  medications as prescribed   Work with the pharmacist to address medication management needs and will continue to work with the clinical team to address health care and disease management related needs schedule appointment with eye doctor check blood sugar at prescribed times: once daily check feet daily for cuts, sores or redness enter blood sugar readings and medication or insulin  into daily log take the blood sugar log to all doctor visits trim toenails straight across drink 6 to 8 glasses of water each day manage portion size keep feet up while sitting wash and dry feet carefully every day wear comfortable, cotton socks wear comfortable, well-fitting shoes Monitor existing wound to right big toe for symptoms of infection and call podiatrist with concerns. keep all lab appointments take medicine as prescribed check blood pressure daily write blood pressure results in a log or diary keep a blood pressure log take blood pressure log to all doctor appointments call doctor for signs and symptoms of high blood pressure keep all doctor appointments take medications for blood pressure exactly as prescribed report new symptoms to your doctor  Plan:  Telephone follow up appointment with care management team member scheduled for:  06/24/23 at 2:00             Please call the Suicide and Crisis Lifeline: 988 call the [place]  National Suicide Prevention Lifeline: 657-079-6366 or TTY: 727-179-3306 TTY (248) 351-9766) to talk to a trained counselor call 1-800-273-TALK (toll free, 24 hour hotline) if you are experiencing a Mental Health or Behavioral Health Crisis or need someone to talk to.  The patient verbalized understanding of instructions, educational materials, and care plan provided today and DECLINED offer to receive copy of patient instructions, educational materials, and care plan.    Clarnce Crow BSN RN CCM [place]  [facility], [facility] RN Care  Manager Direct Dial: [phone] Fax: [phone]

## 2023-06-06 NOTE — Telephone Encounter (Unsigned)
 Copied from CRM [id number]. Topic: Clinical - Request for Lab/Test Order >> Jun 05, 2023  4:20 PM [place] J wrote: Reason for CRM: Patient would like an order put in to check her A1C since she gets it checked every 3 months.

## 2023-06-10 ENCOUNTER — Other Ambulatory Visit: Payer: Self-pay

## 2023-06-10 ENCOUNTER — Other Ambulatory Visit (HOSPITAL_COMMUNITY): Payer: Self-pay

## 2023-06-10 ENCOUNTER — Other Ambulatory Visit: Payer: Self-pay | Admitting: Family Medicine

## 2023-06-10 DIAGNOSIS — I10 Essential (primary) hypertension: Secondary | ICD-10-CM

## 2023-06-10 MED ORDER — METOPROLOL SUCCINATE ER 25 MG PO TB24
25.0000 mg | ORAL_TABLET | Freq: Every day | ORAL | 3 refills | Status: AC
  Filled 2023-06-10: qty 90, 90d supply, fill #0
  Filled 2023-09-10: qty 90, 90d supply, fill #1
  Filled 2023-12-09: qty 90, 90d supply, fill #2

## 2023-06-10 MED ORDER — AMLODIPINE BESYLATE 10 MG PO TABS
10.0000 mg | ORAL_TABLET | Freq: Every day | ORAL | 3 refills | Status: AC
Start: 2023-06-10 — End: ?
  Filled 2023-06-10: qty 90, 90d supply, fill #0
  Filled 2023-09-10: qty 90, 90d supply, fill #1
  Filled 2023-12-09: qty 90, 90d supply, fill #2

## 2023-06-11 NOTE — Progress Notes (Signed)
 [facility] Weekly Session  Patient Details  Name: Rose Miller MRN: [id number] Date of Birth: Oct 03, 1955 Age: 68 y.o. PCP: Catheryn Cluck, MD  Vitals:   06/11/23 1349  Weight: (!) 364 lb (165.1 kg)     [facility] Weekly seesion - 06/11/23 1300       [facility] "PREP[facility] Location   [facility]PREP[facility] Location [facility]      Weekly Session   Topic Discussed Healthy eating tips   Discussion on foods to reduce and to increase. Introduced YUKA app.   Minutes exercised this week 100 minutes    Classes attended to date 6             [facility] 06/11/2023, 1:50 PM

## 2023-06-16 NOTE — Progress Notes (Signed)
 [facility] PRE[facility] Weekly Session  Patient Details  Name: Rose Miller MRN: [id number] Date of Birth: 10-Mar-1955 Age: 68 y.o. PCP: Catheryn Cluck, MD  Vitals:   06/16/23 1325  Weight: (!) 358 lb (162.4 kg)     [facility] Weekly seesion - 06/16/23 1300       [facility] "PREP[facility] Location   [facility]PREP[facility] Location [facility]      Weekly Session   Topic Discussed Health habits   Information on healthy habits and sugar talk   Minutes exercised this week 115 minutes    Classes attended to date 7             [facility] 06/16/2023, 1:26 PM

## 2023-06-17 ENCOUNTER — Other Ambulatory Visit: Payer: Self-pay

## 2023-06-17 ENCOUNTER — Encounter: Payer: Self-pay | Admitting: Family Medicine

## 2023-06-17 ENCOUNTER — Ambulatory Visit (INDEPENDENT_AMBULATORY_CARE_PROVIDER_SITE_OTHER): Admitting: Family Medicine

## 2023-06-17 VITALS — BP 120/68 | HR 80 | Temp 97.0°F | Resp 18 | Wt 359.4 lb

## 2023-06-17 DIAGNOSIS — Z6841 Body Mass Index (BMI) 40.0 and over, adult: Secondary | ICD-10-CM | POA: Diagnosis not present

## 2023-06-17 DIAGNOSIS — E669 Obesity, unspecified: Secondary | ICD-10-CM | POA: Diagnosis not present

## 2023-06-17 DIAGNOSIS — Z792 Long term (current) use of antibiotics: Secondary | ICD-10-CM

## 2023-06-17 DIAGNOSIS — E119 Type 2 diabetes mellitus without complications: Secondary | ICD-10-CM

## 2023-06-17 DIAGNOSIS — Z7985 Long-term (current) use of injectable non-insulin antidiabetic drugs: Secondary | ICD-10-CM

## 2023-06-17 DIAGNOSIS — E1169 Type 2 diabetes mellitus with other specified complication: Secondary | ICD-10-CM

## 2023-06-17 LAB — POCT GLYCOSYLATED HEMOGLOBIN (HGB A1C): Hemoglobin A1C: 5.9 % — AB (ref 4.0–5.6)

## 2023-06-17 MED ORDER — AMOXICILLIN 500 MG PO CAPS
ORAL_CAPSULE | ORAL | 6 refills | Status: AC
  Filled 2023-06-17: qty 12, 3d supply, fill #0
  Filled 2023-10-25: qty 12, 3d supply, fill #1
  Filled 2023-12-08: qty 12, 3d supply, fill #2
  Filled 2023-12-16: qty 12, 3d supply, fill #3

## 2023-06-17 NOTE — Progress Notes (Signed)
 Assessment & Plan   Assessment/Plan:    Assessment & Plan Type 2 Diabetes Mellitus Type 2 Diabetes Mellitus is well-controlled with an A1c of 5.9. She is currently on Ozempic  1 mg and is transitioning to 2 mg. She is also participating in [facility] classes to improve physical activity and has lost 5-6 pounds, indicating positive lifestyle changes. - Continue Ozempic , transition to 2 mg when current supply is finished - Encourage continued participation in [employer] classes - Encourage gradual increase in walking duration starting with 10 minutes and increasing as tolerated  Dental Prophylaxis She requires amoxicillin  for dental prophylaxis prior to a routine dental cleaning. The last prescription was filled in 2023 by a different provider. She will make a dental appointment once she receives the medication. - Prescribe amoxicillin  500 mg, instruct to take 4 tablets (2000 mg) one hour before dental procedure - Provide several refills for future dental procedures      Medications Discontinued During This Encounter  Medication Reason   amoxicillin  (AMOXIL ) 500 MG tablet Reorder            Subjective:   Encounter date: 06/17/2023  Rose Miller is a 68 y.o. female who has Ventral hernia; IT band syndrome; Right shoulder pain; Periumbilical pain; Hyperlipidemia LDL goal <70; Class 3 severe obesity due to excess calories with body mass index (BMI) of 50.0 to 59.9 in adult; Type 2 diabetes mellitus with obesity (HCC); CKD stage 3 secondary to diabetes (HCC); Postmenopausal estrogen deficiency; Former smoker; Long-term use of aspirin  therapy; Hypertension associated with diabetes (HCC); Aortic valve disease; COPD (chronic obstructive pulmonary disease) (HCC); Pharyngoesophageal dysphagia; Family history of colon cancer in father; Diplopia; Allergic rhinitis; PAF (paroxysmal atrial fibrillation) (HCC); Otalgia of left ear; Ingrown nail of great toe of left foot; Vitamin D  deficiency; Low back  pain; Chronic pain of both knees; Dyspnea on exertion; Coronary artery disease due to calcified coronary lesion; Aortic atherosclerosis (HCC); Abscess of lower back; and Kidney lesion on their problem list..   She  has a past medical history of Adhesive capsulitis, Aortic valve disease, Arthritis, Asthma, Back pain, CKD (chronic kidney disease) stage 3, GFR 30-59 ml/min (HCC), Constipation, COPD (chronic obstructive pulmonary disease) (HCC), Diabetes mellitus without complication (HCC), Diuretic-induced hypokalemia, Hypercholesterolemia, Hypertension, IT band syndrome, Morbid obesity (HCC), Palpitations, Periumbilical pain, Postmenopausal estrogen deficiency, Prediabetes, S/P TAVR (transcatheter aortic valve replacement) (12/26/2020), and Ventral hernia..   She presents with chief complaint of Medical Management of Chronic Issues (1 month follow up DM. Pt is fasting today //HM due- Diabetec eye exam ) .   Discussed the use of AI scribe software for clinical note transcription with the patient, who gave verbal consent to proceed.  History of Present Illness Rose Miller is a 68 year old female with diabetes who presents for routine follow-up.  Her A1c remains excellent at 5.9. She is currently using Ozempic  at a dose of one milligram and has not yet started the two milligram dose as she is finishing her current supply. The pharmacy has processed the two milligram dose, but she has not received it yet.  She has started attending classes at the [facility] to improve her physical activity. Her walking is not up to par, as she cannot walk for half an hour yet, but plans to start with ten minutes and gradually increase. She has lost weight, reporting a loss of five to six pounds recently.  She requests a refill of amoxicillin  for a dental appointment. She has not had a  refill since 2023, which was last prescribed by a different doctor. She plans to make a dental appointment as soon as she receives the  medication.  No pain or other issues.     ROS  Past Surgical History:  Procedure Laterality Date   BIOPSY  05/01/2021   Procedure: BIOPSY;  Surgeon: Lajuan Pila, MD;  Location: [facility];  Service: Gastroenterology;;   CESAREAN SECTION  1989   COLONOSCOPY     COLONOSCOPY WITH PROPOFOL  N/A 05/01/2021   Procedure: COLONOSCOPY WITH PROPOFOL ;  Surgeon: Lajuan Pila, MD;  Location: [facility];  Service: Gastroenterology;  Laterality: N/A;   ESOPHAGOGASTRODUODENOSCOPY (EGD) WITH PROPOFOL  N/A 05/01/2021   Procedure: ESOPHAGOGASTRODUODENOSCOPY (EGD) WITH PROPOFOL ;  Surgeon: Lajuan Pila, MD;  Location: [facility];  Service: Gastroenterology;  Laterality: N/A;   INTRAOPERATIVE TRANSTHORACIC ECHOCARDIOGRAM N/A 12/26/2020   Procedure: INTRAOPERATIVE TRANSTHORACIC ECHOCARDIOGRAM;  Surgeon: Kyra Phy, MD;  Location: [facility];  Service: Open Heart Surgery;  Laterality: N/A;   MALONEY DILATION  05/01/2021   Procedure: Londa Rival DILATION;  Surgeon: Lajuan Pila, MD;  Location: [facility];  Service: Gastroenterology;;   POLYPECTOMY  05/01/2021   Procedure: POLYPECTOMY;  Surgeon: Lajuan Pila, MD;  Location: [facility];  Service: Gastroenterology;;   RIGHT/LEFT HEART CATH AND CORONARY ANGIOGRAPHY N/A 11/24/2020   Procedure: RIGHT/LEFT HEART CATH AND CORONARY ANGIOGRAPHY;  Surgeon: Arty Binning, MD;  Location: [facility] INVASIVE CV LAB;  Service: Cardiovascular;  Laterality: N/A;   TONSILLECTOMY AND ADENOIDECTOMY  1960   TRANSCATHETER AORTIC VALVE REPLACEMENT, TRANSFEMORAL N/A 12/26/2020   Procedure: TRANSCATHETER AORTIC VALVE REPLACEMENT, TRANSFEMORAL;  Surgeon: Kyra Phy, MD;  Location: [facility];  Service: Open Heart Surgery;  Laterality: N/A;    Outpatient Medications Prior to Visit  Medication Sig Dispense Refill   amLODipine  (NORVASC ) 10 MG tablet Take 1 tablet (10 mg total) by mouth daily. 90 tablet 3   apixaban  (ELIQUIS ) 5 MG TABS tablet Take 1 tablet (5  mg total) by mouth 2 (two) times daily. 180 tablet 1   Ascorbic Acid (VITAMIN C) 1000 MG tablet Take 1,000 mg by mouth daily.     atorvastatin  (LIPITOR) 10 MG tablet Take 1 tablet (10 mg total) by mouth daily. 90 tablet 3   azelastine  (ASTELIN ) 0.1 % nasal spray Place 2 sprays into both nostrils 2 (two) times daily. Use in each nostril as directed 30 mL 12   Blood Glucose Monitoring Suppl (ONE TOUCH ULTRA 2) w/Device KIT Use as directed in the morning, at noon, and at bedtime. 1 kit 0   Blood Glucose Monitoring Suppl DEVI Use as directed. 1 each 0   BOOSTRIX 5-2.5-18.5 LF-MCG/0.5 injection      Cholecalciferol 50 MCG (2000 UT) TABS Take 2,000 Units by mouth daily.     Coenzyme Q10 (CO Q-10) 100 MG CAPS Take 100 mg by mouth daily.      Cyanocobalamin (B-12) 500 MCG TABS Take 500 mcg by mouth daily.     diltiazem  (CARDIZEM ) 30 MG tablet Take 1 tablet (30 mg total) by mouth every 6 (six) hours as needed (fast heart rate, heart palpitations). 30 tablet 6   empagliflozin  (JARDIANCE ) 10 MG TABS tablet Take 1 tablet (10 mg total) by mouth daily before breakfast. 90 tablet 0   fluticasone  (FLONASE ) 50 MCG/ACT nasal spray Place 2 sprays into both nostrils daily. 16 g 6   furosemide  (LASIX ) 20 MG tablet Take 1 tablet (20 mg total) by mouth daily as needed (for lower extremity swelling).  30 tablet 4   glucose blood (ONETOUCH ULTRA TEST) test strip Use to check blood sugar up to three times daily 100 each 12   hydrALAZINE  (APRESOLINE ) 50 MG tablet Take 1 tablet (50 mg total) by mouth 3 (three) times daily. 270 tablet 3   ipratropium (ATROVENT ) 0.06 % nasal spray Place 2 sprays into both nostrils 4 (four) times daily. 15 mL 12   losartan  (COZAAR ) 100 MG tablet Take 1 tablet (100 mg total) by mouth daily. Discontinue losartan /hctz 90 tablet 3   Menthol, Topical Analgesic, (BLUE-EMU MAXIMUM STRENGTH) 2.5 % LIQD Apply 1 application topically daily as needed (pain).     metoprolol  succinate (TOPROL -XL) 25 MG 24 hr  tablet Take 1 tablet (25 mg total) by mouth daily. 90 tablet 3   Olopatadine  HCl 0.2 % SOLN Apply 1 drop to eye in the morning and at bedtime. 2.5 mL 3   omeprazole  (PRILOSEC ) 20 MG capsule Take 1 capsule (20 mg total) by mouth daily. 30 capsule 11   Semaglutide , 1 MG/DOSE, (OZEMPIC , 1 MG/DOSE,) 4 MG/3ML SOPN Inject 1 mg into the skin once a week.     spironolactone  (ALDACTONE ) 25 MG tablet Take 1 tablet (25 mg total) by mouth daily. 90 tablet 2   Tiotropium Bromide-Olodaterol (STIOLTO RESPIMAT ) 2.5-2.5 MCG/ACT AERS Inhale 2 puffs into the lungs daily. 4 g 3   Zinc 50 MG TABS Take 50 mg by mouth daily.     amoxicillin  (AMOXIL ) 500 MG tablet TAKE 4 TABLETS 1 HOUR PRIORTO DENTAL PROCEDURES AND   CLEANINGS 12 tablet 6   No facility-administered medications prior to visit.    Family History  Problem Relation Age of Onset   Dementia Mother    Arthritis Mother    Cancer Mother        pancreatic   Hypertension Mother    Obesity Mother    Cancer Father        prostate and colon   Colon cancer Father    Heart disease Father    Thyroid  disease Father    Breast cancer Neg Hx     Social History   Socioeconomic History   Marital status: Single    Spouse name: Not on file   Number of children: 1   Years of education: Not on file   Highest education level: Not on file  Occupational History   Occupation: retired  Tobacco Use   Smoking status: Former    Current packs/day: 0.00    Average packs/day: 1 pack/day for 38.0 years (38.0 ttl pk-yrs)    Types: Cigarettes    Start date: 74    Quit date: 2013    Years since quitting: 12.4    Passive exposure: Never   Smokeless tobacco: Never  Vaping Use   Vaping status: Never Used  Substance and Sexual Activity   Alcohol use: Yes    Comment: socially   Drug use: No   Sexual activity: Not Currently  Other Topics Concern   Not on file  Social History Narrative   Not on file   Social Drivers of Health   Financial Resource Strain: Low  Risk  (03/28/2023)   Overall Financial Resource Strain (CARDIA)    Difficulty of Paying Living Expenses: Not hard at all  Food Insecurity: No Food Insecurity (06/05/2023)   Hunger Vital Sign    Worried About Running Out of Food in the Last Year: Never true    Ran Out of Food in the Last Year: Never true  Transportation Needs: No Transportation Needs (06/05/2023)   PRAPARE - Administrator, Civil Service (Medical): No    Lack of Transportation (Non-Medical): No  Physical Activity: Inactive (03/28/2023)   Exercise Vital Sign    Days of Exercise per Week: 0 days    Minutes of Exercise per Session: 0 min  Stress: No Stress Concern Present (03/28/2023)   [employer] of Occupational Health - Occupational Stress Questionnaire    Feeling of Stress : Not at all  Social Connections: Socially Isolated (03/28/2023)   Social Connection and Isolation Panel [NHANES]    Frequency of Communication with Friends and Family: More than three times a week    Frequency of Social Gatherings with Friends and Family: Patient declined    Attends Religious Services: Never    Database administrator or Organizations: No    Attends Banker Meetings: Never    Marital Status: Never married  Intimate Partner Violence: Not At Risk (06/05/2023)   Humiliation, Afraid, Rape, and Kick questionnaire    Fear of Current or Ex-Partner: No    Emotionally Abused: No    Physically Abused: No    Sexually Abused: No                                                                                                  Objective:  Physical Exam: BP 120/68 (BP Location: Right Arm, Patient Position: Sitting, Cuff Size: Large) Comment: done manual  Pulse 80   Temp (!) 97 F (36.1 C) (Temporal)   Resp 18   Wt (!) 359 lb 6.4 oz (163 kg)   SpO2 98%   BMI 51.57 kg/m   Wt Readings from Last 3 Encounters:  06/17/23 (!) 359 lb 6.4 oz (163 kg)  06/16/23 (!) 358 lb (162.4 kg)  06/11/23 (!) 364 lb (165.1 kg)     Physical Exam GENERAL: Alert, cooperative, well developed, no acute distress, normal physical examination. HEENT: Normocephalic, normal oropharynx, moist mucous membranes. CHEST: Clear to auscultation bilaterally, no wheezes, rhonchi, or crackles. CARDIOVASCULAR: Normal heart rate and rhythm, S1 and S2 normal without murmurs. ABDOMEN: Soft, non-tender, non-distended, without organomegaly, normal bowel sounds. EXTREMITIES: No cyanosis or edema. NEUROLOGICAL: Cranial nerves grossly intact, moves all extremities without gross motor or sensory deficit.   Physical Exam  MR ABDOMEN WWO CONTRAST Result Date: 05/15/2023 CLINICAL DATA:  Characterize indeterminate right renal lesion identified by prior CT EXAM: MRI ABDOMEN WITHOUT AND WITH CONTRAST TECHNIQUE: Multiplanar multisequence MR imaging of the abdomen was performed both before and after the administration of intravenous contrast. CONTRAST:  10 mL Vueway  gadolinium contrast IV COMPARISON:  CT abdomen pelvis, 03/14/2023 FINDINGS: Lower chest: No acute abnormality. Hepatobiliary: No solid liver abnormality is seen. Hepatic steatosis. Small gallstones. No gallbladder wall thickening, or biliary dilatation. Pancreas: Unremarkable. No pancreatic ductal dilatation or surrounding inflammatory changes. Spleen: Normal in size without significant abnormality. Adrenals/Urinary Tract: Adrenal glands are unremarkable. Lesion of interest arising from the anterior midportion right kidney is a benign simple fluid signal cyst by MR (series 12, image 20). No solid renal lesion or suspicious  contrast enhancement. Kidneys are otherwise normal, without obvious renal calculi, solid lesion, or hydronephrosis. Stomach/Bowel: Stomach is within normal limits. No evidence of bowel wall thickening, distention, or inflammatory changes. Vascular/Lymphatic: Aortic atherosclerosis. No enlarged abdominal lymph nodes. Other: Fat containing umbilical hernia.  No ascites.  Musculoskeletal: No acute or significant osseous findings. IMPRESSION: 1. Lesion of interest arising from the anterior midportion right kidney is a benign simple fluid signal cyst by MR. No solid renal lesion or suspicious contrast enhancement. No further follow-up or characterization is required for this benign Bosniak category I cyst. 2. Hepatic steatosis. 3. Cholelithiasis. Aortic Atherosclerosis (ICD10-I70.0). Electronically Signed   By: Fredricka Jenny M.D.   On: 05/15/2023 21:51   ECHOCARDIOGRAM COMPLETE Result Date: 04/07/2023    ECHOCARDIOGRAM REPORT   Patient Name:   MADISIN HASAN Date of Exam: 04/07/2023 Medical Rec #:  [id number]        Height:       70.0 in Accession #:    [id number]       Weight:       359.8 lb Date of Birth:  09-01-1955       BSA:          2.679 m Patient Age:    69 years         BP:           126/82 mmHg Patient Gender: F                HR:           74 bpm. Exam Location:  [address] Procedure: 2D Echo, Cardiac Doppler and Color Doppler (Both Spectral and Color            Flow Doppler were utilized during procedure). Indications:    Z95.2 Status post TAVR  History:        Patient has prior history of Echocardiogram examinations, most                 recent 12/27/2022. TAVR-66mm Sapien 3 Ultra Resilia, COPD; Risk                 Factors:Hypertension, Dyslipidemia and Diabetes.                 Aortic Valve: 26 mm Sapien prosthetic, stented (TAVR) valve is                 present in the aortic position. Procedure Date: 12/26/2020.  Sonographer:    Brigid Canada RDCS Referring Phys: [id number] KATHRYN R Burnadette Baskett IMPRESSIONS  1. Left ventricular ejection fraction, by estimation, is 65 to 70%. The left ventricle has normal function. The left ventricle has no regional wall motion abnormalities. There is moderate concentric left ventricular hypertrophy. Left ventricular diastolic parameters are indeterminate.  2. Right ventricular systolic function is normal. The right  ventricular size is normal. Tricuspid regurgitation signal is inadequate for assessing PA pressure.  3. Left atrial size was mildly dilated.  4. The mitral valve is normal in structure. Trivial mitral valve regurgitation. No evidence of mitral stenosis.  5. The aortic valve has been repaired/replaced. Aortic valve regurgitation is not visualized. There is a 26 mm Sapien prosthetic (TAVR) valve present in the aortic position. Procedure Date: 12/26/2020. Echo findings are consistent with normal structure and function of the aortic valve prosthesis. Aortic valve mean gradient measures 12.8 mmHg.  6. Aortic dilatation noted. There is borderline dilatation of the ascending aorta, measuring 39 mm.  7. The inferior vena  cava is normal in size with greater than 50% respiratory variability, suggesting right atrial pressure of 3 mmHg. Comparison(s): Prior images reviewed side by side. Changes from prior study are noted. TAVR gradient improved from 23 to 12.8 mmHg. FINDINGS  Left Ventricle: Left ventricular ejection fraction, by estimation, is 65 to 70%. The left ventricle has normal function. The left ventricle has no regional wall motion abnormalities. The left ventricular internal cavity size was normal in size. There is  moderate concentric left ventricular hypertrophy. Left ventricular diastolic parameters are indeterminate. Right Ventricle: The right ventricular size is normal. Right vetricular wall thickness was not well visualized. Right ventricular systolic function is normal. Tricuspid regurgitation signal is inadequate for assessing PA pressure. Left Atrium: Left atrial size was mildly dilated. Right Atrium: Right atrial size was normal in size. Pericardium: There is no evidence of pericardial effusion. Mitral Valve: The mitral valve is normal in structure. Trivial mitral valve regurgitation. No evidence of mitral valve stenosis. Tricuspid Valve: The tricuspid valve is grossly normal. Tricuspid valve regurgitation  is not demonstrated. No evidence of tricuspid stenosis. The aortic valve has been repaired/replaced. Aortic valve regurgitation is not visualized. There is a 26 mm Sapien prosthetic, stented (TAVR) valve present in the aortic position. Procedure Date: 12/26/2020. Echo findings are consistent with normal structure and function of the aortic valve prosthesis. Pulmonic Valve: The pulmonic valve was not well visualized. Pulmonic valve regurgitation is not visualized. Aorta: Aortic dilatation noted. There is borderline dilatation of the ascending aorta, measuring 39 mm. Venous: The inferior vena cava is normal in size with greater than 50% respiratory variability, suggesting right atrial pressure of 3 mmHg. IAS/Shunts: The atrial septum is grossly normal.  LEFT VENTRICLE PLAX 2D LVIDd:         4.80 cm Diastology LVIDs:         2.90 cm LV e' medial:    6.15 cm/s LV PW:         1.75 cm LV E/e' medial:  17.2 LV IVS:        1.71 cm LV e' lateral:   4.95 cm/s                        LV E/e' lateral: 21.4  RIGHT VENTRICLE             IVC RV Basal diam:  3.25 cm     IVC diam: 1.20 cm RV S prime:     16.90 cm/s TAPSE (M-mode): 2.7 cm LEFT ATRIUM             Index        RIGHT ATRIUM           Index LA diam:        4.70 cm 1.75 cm/m   RA Area:     11.00 cm LA Vol (A2C):   44.9 ml 16.76 ml/m  RA Volume:   24.00 ml  8.96 ml/m LA Vol (A4C):   72.6 ml 27.10 ml/m LA Biplane Vol: 57.6 ml 21.50 ml/m  AORTIC VALVE AV Vmax:           240.20 cm/s AV Vmean:          165.200 cm/s AV VTI:            0.539 m AV Peak Grad:      23.1 mmHg AV Mean Grad:      12.8 mmHg LVOT Vmax:         129.67  cm/s LVOT Vmean:        86.733 cm/s LVOT VTI:          0.307 m LVOT/AV VTI ratio: 0.57  AORTA Ao Root diam: 3.50 cm Ao Asc diam:  3.90 cm MITRAL VALVE MV Area (PHT): 2.93 cm     SHUNTS MV Decel Time: 259 msec     Systemic VTI: 0.31 m MV E velocity: 106.00 cm/s MV A velocity: 109.50 cm/s MV E/A ratio:  0.97 Sheryle Donning MD Electronically  signed by Sheryle Donning MD Signature Date/Time: 04/07/2023/5:51:34 PM    Final     Recent Results (from the past 2160 hours)  ECHOCARDIOGRAM COMPLETE     Status: None   Collection Time: 04/07/23 10:19 AM  Result Value Ref Range   Area-P 1/2 2.93 cm2   S' Lateral 2.90 cm   Ao pk vel 2.40 m/s   AV Mean grad 12.8 mmHg   AV Peak grad 23.1 mmHg   Est EF 65 - 70%   Pulmonary function test     Status: None   Collection Time: 04/10/23  9:28 AM  Result Value Ref Range   FVC-Pre 2.83 L   FVC-%Pred-Pre 74 %   FVC-Post 2.77 L   FVC-%Pred-Post 73 %   FVC-%Change-Post -2 %   FEV1-Pre 2.44 L   FEV1-%Pred-Pre 84 %   FEV1-Post 2.44 L   FEV1-%Pred-Post 84 %   FEV1-%Change-Post 0 %   FEV6-Pre 2.83 L   FEV6-%Pred-Pre 77 %   FEV6-Post 2.77 L   FEV6-%Pred-Post 75 %   FEV6-%Change-Post -2 %   Pre FEV1/FVC ratio 86 %   FEV1FVC-%Pred-Pre 113 %   Post FEV1/FVC ratio 88 %   FEV1FVC-%Change-Post 2 %   Pre FEV6/FVC Ratio 100 %   FEV6FVC-%Pred-Pre 104 %   Post FEV6/FVC ratio 100 %   FEV6FVC-%Pred-Post 104 %   FEF 25-75 Pre 3.07 L/sec   FEF2575-%Pred-Pre 133 %   FEF 25-75 Post 3.31 L/sec   FEF2575-%Pred-Post 143 %   FEF2575-%Change-Post 7 %   RV 2.07 L   RV % pred 84 %   TLC 4.99 L   TLC % pred 83 %   DLCO unc 22.31 ml/min/mmHg   DLCO unc % pred 93 %   DLCO cor 22.31 ml/min/mmHg   DLCO cor % pred 93 %   DL/VA 2.84 ml/min/mmHg/L   DL/VA % pred 132 %  POCT glycosylated hemoglobin (Hb A1C)     Status: Abnormal   Collection Time: 06/17/23 10:32 AM  Result Value Ref Range   Hemoglobin A1C 5.9 (A) 4.0 - 5.6 %   HbA1c POC (<> result, manual entry)     HbA1c, POC (prediabetic range)     HbA1c, POC (controlled diabetic range)          Carnell Christian, MD, MS

## 2023-06-22 ENCOUNTER — Other Ambulatory Visit: Payer: Self-pay | Admitting: Cardiovascular Disease

## 2023-06-22 DIAGNOSIS — I10 Essential (primary) hypertension: Secondary | ICD-10-CM

## 2023-06-23 ENCOUNTER — Other Ambulatory Visit: Payer: Self-pay

## 2023-06-23 NOTE — Progress Notes (Signed)
 [employer] PREP Weekly Session  Patient Details  Name: Rose Miller MRN: [id number] Date of Birth: 09/06/1955 Age: 68 y.o. PCP: Catheryn Cluck, MD  Vitals:   06/23/23 1408  Weight: (!) 359 lb (162.8 kg)     [employer] Weekly seesion - 06/23/23 1400       [employer] "PREP" Location   [facility] "PREP" Location [facility]      Weekly Session   Topic Discussed Restaurant Eating   Tips for eating out and salt demo   Minutes exercised this week 46 minutes    Classes attended to date [address] 06/23/2023, 2:09 PM

## 2023-06-24 ENCOUNTER — Other Ambulatory Visit: Payer: Self-pay

## 2023-06-24 ENCOUNTER — Other Ambulatory Visit (HOSPITAL_COMMUNITY): Payer: Self-pay

## 2023-06-24 MED ORDER — HYDRALAZINE HCL 50 MG PO TABS
50.0000 mg | ORAL_TABLET | Freq: Three times a day (TID) | ORAL | 2 refills | Status: AC
  Filled 2023-06-24: qty 270, 90d supply, fill #0
  Filled 2023-09-17: qty 270, 90d supply, fill #1
  Filled 2023-12-16: qty 270, 90d supply, fill #2

## 2023-06-24 NOTE — Patient Instructions (Signed)
 Visit Information  Thank you for taking time to visit with me today. Please don't hesitate to contact me if I can be of assistance to you before our next scheduled appointment.  Our next appointment is by telephone on 07/24/23 at 2:00 Please call the care guide team at [phone] if you need to cancel or reschedule your appointment.   Following is a copy of your care plan:   Goals Addressed             This Visit's Progress    VBCI RN Care Plan   On track    Problems:  Chronic Disease Management support and education needs related to Atrial Fibrillation, DMII, and HTN  Goal: Over the next 30 days the Patient will attend all scheduled medical appointments: 07/08/23 Nutriion, 08/11/23 CVD, 08/20/23 Pulmonology as evidenced by chart review and patient report        demonstrate ongoing self health care management ability schedule diabetic eye exam and dental exam as evidenced by     take all medications exactly as prescribed and will call provider for medication related questions as evidenced by chart review and patient report     Interventions:   AFIB Interventions:   Counseled on increased risk of stroke due to Afib and benefits of anticoagulation for stroke prevention Reviewed importance of adherence to anticoagulant exactly as prescribed Counseled on bleeding risk associated with Eliquis  and importance of self-monitoring for signs/symptoms of bleeding Counseled on avoidance of NSAIDs due to increased bleeding risk with anticoagulants Counseled on importance of regular laboratory monitoring as prescribed Counseled on seeking medical attention after a head injury or if there is blood in the urine/stool Screening for signs and symptoms of depression related to chronic disease state  Assessed social determinant of health barriers   Diabetes Interventions: Assessed patient's understanding of A1c goal: <6.5%  Provided education to patient about basic DM disease process Reviewed  medications with patient and discussed importance of medication adherence Counseled on importance of regular laboratory monitoring as prescribed Discussed plans with patient for ongoing care management follow up and provided patient with direct contact information for care management team A1C increased from 5.8 to 5.9.  Patient has initiated exercise program at [facility], has Diabetic Nutrition appt scheduled, and is hopeful that she will see improvement at next PCP visit. Lab Results  Component Value Date   HGBA1C 5.9 (A) 06/17/2023    Hypertension Interventions: Last practice recorded BP readings:  BP Readings from Last 3 Encounters:  06/24/23 109/67  06/17/23 120/68  06/05/23 131/70   Most recent eGFR/CrCl:  Lab Results  Component Value Date   EGFR 44 (L) 04/12/2022    No components found for: "CRCL"  Evaluation of current treatment plan related to hypertension self management and patient's adherence to plan as established by provider Provided education to patient re: stroke prevention, s/s of heart attack and stroke Reviewed medications with patient and discussed importance of compliance Discussed plans with patient for ongoing care management follow up and provided patient with direct contact information for care management team Advised patient, providing education and rationale, to monitor blood pressure daily and record, calling PCP for findings outside established parameters Discussed complications of poorly controlled blood pressure such as heart disease, stroke, circulatory complications, vision complications, kidney impairment, sexual dysfunction Screening for signs and symptoms of depression related to chronic disease state  Assessed social determinant of health barriers  Patient Self-Care Activities:  Attend all scheduled provider appointments Attend church or other social  activities Call pharmacy for medication refills 3-7 days in advance of running out of medications Call  provider office for new concerns or questions  Perform all self care activities independently  Perform IADL's (shopping, preparing meals, housekeeping, managing finances) independently Take medications as prescribed   Work with the pharmacist to address medication management needs and will continue to work with the clinical team to address health care and disease management related needs schedule appointment with eye doctor check blood sugar at prescribed times: once daily check feet daily for cuts, sores or redness enter blood sugar readings and medication or insulin  into daily log take the blood sugar log to all doctor visits trim toenails straight across drink 6 to 8 glasses of water each day manage portion size keep feet up while sitting wash and dry feet carefully every day wear comfortable, cotton socks wear comfortable, well-fitting shoes keep all lab appointments take medicine as prescribed check blood pressure daily write blood pressure results in a log or diary keep a blood pressure log take blood pressure log to all doctor appointments call doctor for signs and symptoms of high blood pressure keep all doctor appointments take medications for blood pressure exactly as prescribed report new symptoms to your doctor  Plan:  Telephone follow up appointment with care management team member scheduled for:  07/24/23 at 2:00             Please call the Suicide and Crisis Lifeline: 988 call the [place]  National Suicide Prevention Lifeline: (615)480-6535 or TTY: (807)554-8735 TTY (828)586-8980) to talk to a trained counselor call 1-800-273-TALK (toll free, 24 hour hotline) if you are experiencing a Mental Health or Behavioral Health Crisis or need someone to talk to.  Patient verbalizes understanding of instructions and care plan provided today and agrees to view in MyChart. Active MyChart status and patient understanding of how to access instructions and care plan via MyChart  confirmed with patient.      Clarnce Crow BSN RN CCM [place]  [facility], [facility] RN Care Manager Direct Dial: [phone] Fax: [phone]

## 2023-06-24 NOTE — Patient Outreach (Signed)
 Complex Care Management   Visit Note  06/24/2023  Name:  Rose Miller MRN: [id number] DOB: August 18, 1955  Situation: Referral received for Complex Care Management related to Diabetes with Complications, Atrial Fibrillation, and HTN I obtained verbal consent from Patient.  Visit completed with patient  on the phone  Background:   Past Medical History:  Diagnosis Date   Adhesive capsulitis    Aortic valve disease    Arthritis    Asthma    Back pain    CKD (chronic kidney disease) stage 3, GFR 30-59 ml/min (HCC)    Constipation    COPD (chronic obstructive pulmonary disease) (HCC)    Diabetes mellitus without complication (HCC)    Diuretic-induced hypokalemia    Hypercholesterolemia    Hypertension    IT band syndrome    Morbid obesity (HCC)    Palpitations    Periumbilical pain    Postmenopausal estrogen deficiency    Prediabetes    S/P TAVR (transcatheter aortic valve replacement) 12/26/2020   Ventral hernia     Assessment: Patient Reported Symptoms:  Cognitive Cognitive Status: Alert and oriented to person, place, and time      Neurological Neurological Review of Symptoms: No symptoms reported    HEENT HEENT Symptoms Reported: No symptoms reported      Cardiovascular Cardiovascular Symptoms Reported: No symptoms reported, Irregular pulse Does patient have uncontrolled Hypertension?: Yes Is patient checking Blood Pressure at home?: Yes Patient's Recent BP reading at home: 109/67 Cardiovascular Conditions: Hypertension, Dysrhythmia Cardiovascular Management Strategies: Routine screening, Medication therapy Do You Have a Working Readable Scale?: Yes  Respiratory Respiratory Symptoms Reported: No symptoms reported Respiratory Conditions: COPD  Endocrine Patient reports the following symptoms related to hypoglycemia or hyperglycemia : No symptoms reported Is patient diabetic?: Yes Is patient checking blood sugars at home?: Yes Endocrine Conditions:  Diabetes Endocrine Management Strategies: Weight management, Medical device, Medication therapy, Routine screening, Diet modification, Exercise, Coping strategies  Gastrointestinal Gastrointestinal Symptoms Reported: No symptoms reported      Genitourinary Genitourinary Symptoms Reported: No symptoms reported    Integumentary Integumentary Symptoms Reported: No symptoms reported Additional Integumentary Details: wound has resolved    Musculoskeletal Additional Musculoskeletal Details: still with knee pain, has increased exercise and is losing weight and noticing some improvement        Psychosocial Psychosocial Symptoms Reported: No symptoms reported            06/17/2023   11:02 AM  Depression screen PHQ 2/9  Decreased Interest 0  Down, Depressed, Hopeless 0  PHQ - 2 Score 0    Vitals:   06/24/23 1413  BP: 109/67    Medications Reviewed Today     Reviewed by Clarnce Crow, RN (Registered Nurse) on 06/24/23 at 1428  Med List Status: <None>   Medication Order Taking? Sig Documenting Provider Last Dose Status Informant  amLODipine  (NORVASC ) 10 MG tablet [id number]  Take 1 tablet (10 mg total) by mouth daily. Catheryn Cluck, MD  Active   amoxicillin  (AMOXIL ) 500 MG capsule [id number] No take 4 capsules one hour prior to dental procedures and cleanings  Patient not taking: Reported on 06/24/2023   Catheryn Cluck, MD Not Taking Active            Med Note Burley Carpenter, Marck Mcclenny   Tue Jun 24, 2023  2:19 PM) Prescribed for dental exam in October 2025  apixaban  (ELIQUIS ) 5 MG TABS tablet [id number] Yes Take 1 tablet (5 mg total) by mouth 2 (two) times daily.  Catheryn Cluck, MD Taking Active   Ascorbic Acid (VITAMIN C) 1000 MG tablet [id number]  Take 1,000 mg by mouth daily. [provider]  Active Self  atorvastatin  (LIPITOR) 10 MG tablet [id number] Yes Take 1 tablet (10 mg total) by mouth daily. Catheryn Cluck, MD Taking Active   azelastine  (ASTELIN ) 0.1 % nasal spray  [id number] Yes Place 2 sprays into both nostrils 2 (two) times daily. Use in each nostril as directed Lorane Rocker, PA-C Taking Active   Blood Glucose Monitoring Suppl (ONE TOUCH ULTRA 2) w/Device KIT [id number] Yes Use as directed in the morning, at noon, and at bedtime. Catheryn Cluck, MD Taking Active   Blood Glucose Monitoring Suppl DEVI [id number] Yes Use as directed. Catheryn Cluck, MD Taking Active   BOOSTRIX 5-2.5-18.5 LF-MCG/0.5 injection [id number] Yes  [provider] Taking Active   Cholecalciferol 50 MCG (2000 UT) TABS [id number] Yes Take 2,000 Units by mouth daily. [provider] Taking Active Self  Coenzyme Q10 (CO Q-10) 100 MG CAPS [id number] Yes Take 100 mg by mouth daily.  [provider] Taking Active Self  Cyanocobalamin (B-12) 500 MCG TABS [id number] Yes Take 500 mcg by mouth daily. [provider] Taking Active Self  diltiazem  (CARDIZEM ) 30 MG tablet [id number] Yes Take 1 tablet (30 mg total) by mouth every 6 (six) hours as needed (fast heart rate, heart palpitations). Arty Binning, MD Taking Active            Med Note Verdia Glad, [place] R   Thu Apr 10, 2023 10:54 AM)    empagliflozin  (JARDIANCE ) 10 MG TABS tablet [id number] Yes Take 1 tablet (10 mg total) by mouth daily before breakfast. Tonna Frederic, MD Taking Active            Med Note Verdia Glad, [place] R   Thu Apr 10, 2023 10:54 AM)    fluticasone  (FLONASE ) 50 MCG/ACT nasal spray [id number] Yes Place 2 sprays into both nostrils daily. Webb, Padonda B, FNP Taking Active   furosemide  (LASIX ) 20 MG tablet [id number] Yes Take 1 tablet (20 mg total) by mouth daily as needed (for lower extremity swelling). Sonny Dust, MD Taking Active            Med Note Verdia Glad, [place] R   Thu Apr 10, 2023 10:54 AM)    glucose blood (ONETOUCH ULTRA TEST) test strip [id number] Yes Use to check blood sugar up to three times daily Catheryn Cluck, MD Taking Active   hydrALAZINE  (APRESOLINE )  50 MG tablet [id number]  Take 1 tablet (50 mg total) by mouth 3 (three) times daily. Maudine Sos, MD  Active   ipratropium (ATROVENT ) 0.06 % nasal spray [id number] Yes Place 2 sprays into both nostrils 4 (four) times daily. Orelia Binet, MD Taking Active   losartan  (COZAAR ) 100 MG tablet [id number] Yes Take 1 tablet (100 mg total) by mouth daily. Discontinue losartan Alaine Alken, MD Taking Active   Menthol, Topical Analgesic, (BLUE-EMU MAXIMUM STRENGTH) 2.5 % LIQD [id number] Yes Apply 1 application topically daily as needed (pain). [provider] Taking Active Self  metoprolol  succinate (TOPROL -XL) 25 MG 24 hr tablet [id number] Yes Take 1 tablet (25 mg total) by mouth daily. Catheryn Cluck, MD Taking Active   Olopatadine  HCl 0.2 % SOLN [id number] Yes Apply 1 drop to eye in the morning and at bedtime. Orelia Binet, MD Taking Active   omeprazole  (PRILOSEC ) 20 MG capsule [id number] Yes Take 1 capsule (20 mg total) by mouth daily.  Lajuan Pila, MD Taking Active   Semaglutide , 1 MG/DOSE, (OZEMPIC , 1 MG/DOSE,) 4 MG/3ML SOPN [id number] Yes Inject 1 mg into the skin once a week. [provider] Taking Active            Med Note Finley Hugh, CHERYL A   Thu May 29, 2023  3:14 PM) PAP  spironolactone  (ALDACTONE ) 25 MG tablet [id number] Yes Take 1 tablet (25 mg total) by mouth daily. Maudine Sos, MD Taking Active   Tiotropium Bromide-Olodaterol (STIOLTO RESPIMAT ) 2.5-2.5 MCG/ACT AERS [id number] Yes Inhale 2 puffs into the lungs daily. Margaretann Sharper, MD Taking Active   Zinc 50 MG TABS [id number] Yes Take 50 mg by mouth daily. [provider] Taking Active Self            Recommendation:   Continue Current Plan of Care  Follow Up Plan:   Telephone follow up appointment date/time:  07/24/23   Clarnce Crow BSN RN CCM [place]  [facility], [facility] RN Care Manager Direct Dial: [phone] Fax: [phone]

## 2023-06-26 ENCOUNTER — Other Ambulatory Visit (HOSPITAL_COMMUNITY): Payer: Self-pay

## 2023-06-30 ENCOUNTER — Other Ambulatory Visit (HOSPITAL_COMMUNITY): Payer: Self-pay

## 2023-06-30 NOTE — Progress Notes (Signed)
 [facility] [facility] Weekly Session  Patient Details  Name: Rose Miller MRN: [id number] Date of Birth: 10/27/1955 Age: 68 y.o. PCP: Catheryn Cluck, MD  Vitals:   06/30/23 1305  Weight: (!) 359 lb (162.8 kg)     [facility] Weekly seesion - 06/30/23 1300       [facility] PRE[facility] Location   [facility] PRE[facility] Location [facility]      Weekly Session   Topic Discussed Stress management and problem solving   Breath work example, sleep apnea, sleep tips   Minutes exercised this week 175 minutes    Classes attended to date [address] 06/30/2023, 1:05 PM

## 2023-07-04 ENCOUNTER — Other Ambulatory Visit: Payer: Self-pay

## 2023-07-04 ENCOUNTER — Other Ambulatory Visit: Payer: Self-pay | Admitting: Family Medicine

## 2023-07-04 ENCOUNTER — Other Ambulatory Visit (HOSPITAL_COMMUNITY): Payer: Self-pay

## 2023-07-04 DIAGNOSIS — E1169 Type 2 diabetes mellitus with other specified complication: Secondary | ICD-10-CM

## 2023-07-04 MED ORDER — ONETOUCH DELICA PLUS LANCET33G MISC
1.0000 | Freq: Three times a day (TID) | 0 refills | Status: DC
  Filled 2023-07-04: qty 100, 33d supply, fill #0

## 2023-07-05 ENCOUNTER — Other Ambulatory Visit (HOSPITAL_COMMUNITY): Payer: Self-pay

## 2023-07-07 NOTE — Progress Notes (Signed)
 [facility] [facility] Weekly Session  Patient Details  Name: Rose Miller MRN: [id number] Date of Birth: [birth year] Age: 68 y.o. PCP: Sebastian Beverley NOVAK, MD  Vitals:   07/07/23 1310  Weight: (!) 360 lb 6.4 oz (163.5 kg)     [facility] Weekly seesion - 07/07/23 1300       [facility] PREP Location   [facility] PRE[facility] Location [facility]      Weekly Session   Topic Discussed Expectations and non-scale victories   Staying positive, review of past tips   Minutes exercised this week 130 minutes    Classes attended to date [address] 07/07/2023, 1:11 PM

## 2023-07-08 ENCOUNTER — Encounter: Attending: Family Medicine | Admitting: Skilled Nursing Facility1

## 2023-07-08 ENCOUNTER — Encounter: Payer: Self-pay | Admitting: Skilled Nursing Facility1

## 2023-07-08 VITALS — Ht 70.0 in | Wt 360.0 lb

## 2023-07-08 DIAGNOSIS — E1169 Type 2 diabetes mellitus with other specified complication: Secondary | ICD-10-CM | POA: Insufficient documentation

## 2023-07-08 DIAGNOSIS — N183 Chronic kidney disease, stage 3 unspecified: Secondary | ICD-10-CM | POA: Diagnosis not present

## 2023-07-08 DIAGNOSIS — E1122 Type 2 diabetes mellitus with diabetic chronic kidney disease: Secondary | ICD-10-CM | POA: Diagnosis not present

## 2023-07-08 DIAGNOSIS — E669 Obesity, unspecified: Secondary | ICD-10-CM | POA: Diagnosis not present

## 2023-07-08 DIAGNOSIS — E66813 Obesity, class 3: Secondary | ICD-10-CM | POA: Diagnosis not present

## 2023-07-08 DIAGNOSIS — Z6841 Body Mass Index (BMI) 40.0 and over, adult: Secondary | ICD-10-CM | POA: Insufficient documentation

## 2023-07-08 NOTE — Progress Notes (Signed)
 Medical Nutrition Therapy  Appointment Start time:  2:00  Appointment End time:  3:00  Primary concerns today: to lose weight   Referral diagnosis: DM   NUTRITION ASSESSMENT    Clinical Medical Hx: DM, hyperlipidemia, HTN, COPD, asthma, arthritis, CKD    Medications: see list Labs: A1C 5.8 Notable Signs/Symptoms: none reported   Lifestyle & Dietary Hx  Pt states she stared a execise program at the [facility] and seeing improtvmetns.  Pt state she doe snot feel full with the appropriate portions.  Pt reports knowing how to eat and right whole foods to choose but eats fats food and processed food more often.  Pt states she struggles to reduce her ice cream intake.  Pt states she lives with her son and his girlfriend.   Estimated daily fluid intake:  oz Supplements:  Sleep: state she stays up all night pretty much Stress / self-care:  Current average weekly physical activity: exercise program through the [facility]  24-Hr Dietary Recall First Meal: eaten out Snack:  Second Meal: eaten out Snack:  Third Meal 8-9pm: fish sticks or eaten out Snack: 11-12am ice cream Beverages: water   NUTRITION DIAGNOSIS  Inappropriate food choices (NI-5.7.2) Related to lack of motivation to change dietary habits despite knowledge of healthy meal preparation As evidenced by daily consumption of fast food and self-reported low motivation to adopt healthier eating patterns   NUTRITION INTERVENTION  Nutrition education (E-1) on the following topics:  Creation of balanced and diverse meals to increase the intake of nutrient-rich foods that provide essential vitamins, minerals, fiber, and phytonutrients Variety of Fruits and Vegetables: Aim for a colorful array of fruits and vegetables to ensure a wide range of nutrients. Include a mix of leafy greens, berries, citrus fruits, cruciferous vegetables, and more. Whole Grains: Choose whole grains over refined grains. Examples include brown rice, quinoa,  oats, whole wheat, and barley. Lean Proteins: Include lean sources of protein, such as poultry, fish, tofu, legumes, beans, lentils, and low-fat dairy products. Limit red and processed meats. Healthy Fats: Incorporate sources of healthy fats, including avocados, nuts, seeds, and olive oil. Limit saturated and trans fats found in fried and processed foods. Dairy or Dairy Alternatives: Choose low-fat or fat-free dairy products, or plant-based alternatives like almond or soy milk. Portion Control: Be mindful of portion sizes to avoid overeating. Pay attention to hunger and satisfaction cues. Limit Added Sugars: Minimize the consumption of sugary beverages, snacks, and desserts. Check food labels for added sugars and opt for natural sources of sweetness such as whole fruits. Hydration: Drink plenty of water throughout the day. Limit sugary drinks and excessive caffeine intake. Moderate Sodium Intake: Reduce the consumption of high-sodium foods. Use herbs and spices for flavor instead of excessive salt. Meal Planning and Preparation: Plan and prepare meals ahead of time to make healthier choices more convenient. Include a mix of food groups in each meal. Limit Processed Foods: Minimize the intake of highly processed and packaged foods that are often high in added sugars, salt, and unhealthy fats. Regular Physical Activity: Combine a healthy diet with regular physical activity for overall well-being. Aim for at least 150 minutes of moderate-intensity aerobic exercise per week, along with strength training. Moderation and Balance: Enjoy treats and indulgent foods in moderation, emphasizing balance rather than strict restriction.  Handouts Provided Include  101 things to do  Learning Style & Readiness for Change Teaching method utilized: Visual & Auditory  Demonstrated degree of understanding via: Teach Back  Barriers to learning/adherence  to lifestyle change: pre contemplative stage    Goals Established by Pt When your son's girlfriend cooks save it for the next day when it was made too late for you Find a hobby or volunteer  I will be in bed trying to fall asleep by 12am I will get off my computer at 9pm Breakfast Ideas: To be within 1-1.5 hours of waking  1: 2 Eggs + 2 slices whole wheat toast + 1 bacon  2: Special K or Cheerios or FiberOne (follow the serving) (Only buy cereal if it's on sale) + 1 banana +  cup nuts + 1% milk 3: Oatmeal 1 cup + fruit + nuts + brown sugar Lunch/Dinner: 1: tuna fish sandwich on whole wheat + fruit + carrots  2: Rotisserie chicken sandwich or in a pita or with brown rice and salad 3: broccoli + Shrimp + brown rice   MONITORING & EVALUATION Dietary intake, weekly physical activity  Next Steps  Patient is to return in 4-6 weeks.

## 2023-07-11 ENCOUNTER — Other Ambulatory Visit (HOSPITAL_COMMUNITY): Payer: Self-pay

## 2023-07-14 NOTE — Progress Notes (Signed)
 [facility] [facility] Weekly Session  Patient Details  Name: Rose Miller MRN: [id number] Date of Birth: Jun 12, 1955 Age: 68 y.o. PCP: Sebastian Beverley NOVAK, MD  Vitals:   07/14/23 1316  Weight: (!) 359 lb 9.6 oz (163.1 kg)     [facility] Weekly seesion - 07/14/23 1300       [facility] PRE[facility] Location   [facility] PRE[facility] Location [facility]      Weekly Session   Topic Discussed Other   Portion control, visualize your portion size demo; reivew of [employer] food label.   Minutes exercised this week 150 minutes    Classes attended to date 81          Milo Solana B Brenly Trawick 07/14/2023, 1:16 PM

## 2023-07-20 ENCOUNTER — Other Ambulatory Visit: Payer: Self-pay | Admitting: Pulmonary Disease

## 2023-07-21 NOTE — Progress Notes (Signed)
 [facility] PRE[facility] Weekly Session  Patient Details  Name: Rose Miller MRN: [id number] Date of Birth: 1955/05/13 Age: 68 y.o. PCP: Sebastian Beverley NOVAK, MD  Vitals:   07/21/23 1349  Weight: (!) 357 lb 3.2 oz (162 kg)     [facility] Weekly seesion - 07/21/23 1300       [facility] PREP Location   [facility] PRE[facility] Location [facility]      Weekly Session   Topic Discussed Calorie breakdown   Carbs, fats, proteins; percentage of USDA requirements; diff between simple carbs and complex carbs; review of trustworthy supplement organizations   Minutes exercised this week 220 minutes    Classes attended to date 44          Rose Miller 07/21/2023, 1:50 PM

## 2023-07-23 ENCOUNTER — Other Ambulatory Visit (HOSPITAL_COMMUNITY): Payer: Self-pay

## 2023-07-24 ENCOUNTER — Other Ambulatory Visit: Payer: Self-pay

## 2023-07-24 NOTE — Patient Outreach (Signed)
 Complex Care Management   Visit Note  07/24/2023  Name:  Rose Miller MRN: [id number] DOB: 1955-12-04  Situation: Referral received for Complex Care Management related to Diabetes with Complications and HTN I obtained verbal consent from Patient.  Visit completed with patient  on the phone  Background:   Past Medical History:  Diagnosis Date   Adhesive capsulitis    Aortic valve disease    Arthritis    Asthma    Back pain    CKD (chronic kidney disease) stage 3, GFR 30-59 ml/min (HCC)    Constipation    COPD (chronic obstructive pulmonary disease) (HCC)    Diabetes mellitus without complication (HCC)    Diuretic-induced hypokalemia    Hypercholesterolemia    Hypertension    IT band syndrome    Morbid obesity (HCC)    Palpitations    Periumbilical pain    Postmenopausal estrogen deficiency    Prediabetes    S/P TAVR (transcatheter aortic valve replacement) 12/26/2020   Ventral hernia     Assessment: Patient Reported Symptoms:  Cognitive Cognitive Status: Alert and oriented to person, place, and time      Neurological Neurological Review of Symptoms: No symptoms reported    HEENT HEENT Symptoms Reported: No symptoms reported      Cardiovascular Cardiovascular Symptoms Reported: No symptoms reported, Irregular pulse Does patient have uncontrolled Hypertension?: Yes Is patient checking Blood Pressure at home?: Yes Cardiovascular Management Strategies: Medication therapy, Routine screening, Weight management, Exercise, Diet modification Do You Have a Working Readable Scale?: Yes Cardiovascular Self-Management Outcome: 4 (good)  Respiratory      Endocrine Endocrine Symptoms Reported: No symptoms reported Is patient diabetic?: Yes Is patient checking blood sugars at home?: Yes List most recent blood sugar readings, include date and time of day: fasting 07/24/23 103    Gastrointestinal        Genitourinary      Integumentary Integumentary Symptoms Reported:  No symptoms reported    Musculoskeletal Musculoskelatal Symptoms Reviewed: Joint pain Musculoskeletal Management Strategies: Exercise, Weight management, Routine screening, Diet modification Falls in the past year?: No    Psychosocial Psychosocial Symptoms Reported: No symptoms reported     Quality of Family Relationships: helpful, involved, supportive Do you feel physically threatened by others?: No      07/08/2023    2:09 PM  Depression screen PHQ 2/9  Decreased Interest 0  Down, Depressed, Hopeless 0  PHQ - 2 Score 0    Vitals:   07/24/23 1339  BP: 134/74    Medications Reviewed Today     Reviewed by Lonzell Planas, RN (Registered Nurse) on 07/24/23 at 1340  Med List Status: <None>   Medication Order Taking? Sig Documenting Provider Last Dose Status Informant  amLODipine  (NORVASC ) 10 MG tablet [id number] No Take 1 tablet (10 mg total) by mouth daily. Sebastian Beverley NOVAK, MD Taking Active   amoxicillin  (AMOXIL ) 500 MG capsule [id number] No take 4 capsules one hour prior to dental procedures and cleanings  Patient not taking: Reported on 06/24/2023   Sebastian Beverley NOVAK, MD Not Taking Active            Med Note DANICE, Liat Mayol   Tue Jun 24, 2023  2:19 PM) Prescribed for dental exam in October 2025  apixaban  (ELIQUIS ) 5 MG TABS tablet [id number] No Take 1 tablet (5 mg total) by mouth 2 (two) times daily. Sebastian Beverley NOVAK, MD Taking Active   Ascorbic Acid (VITAMIN C) 1000 MG tablet [id number] No Take 1,000  mg by mouth daily. [provider] Taking Active Self  atorvastatin  (LIPITOR) 10 MG tablet [id number] No Take 1 tablet (10 mg total) by mouth daily. Sebastian Beverley NOVAK, MD Taking Active   azelastine  (ASTELIN ) 0.1 % nasal spray [id number] No Place 2 sprays into both nostrils 2 (two) times daily. Use in each nostril as directed Palmer Purchase, PA-C Taking Active   Blood Glucose Monitoring Suppl (ONE TOUCH ULTRA 2) w/Device KIT [id number] No Use as directed in the morning, at noon,  and at bedtime. Sebastian Beverley NOVAK, MD Taking Active   Blood Glucose Monitoring Suppl DEVI [id number] No Use as directed. Sebastian Beverley NOVAK, MD Taking Active   BOOSTRIX 5-2.5-18.5 LF-MCG/0.5 injection [id number] No  [provider] Taking Active   Cholecalciferol 50 MCG (2000 UT) TABS [id number] No Take 2,000 Units by mouth daily. [provider] Taking Active Self  Coenzyme Q10 (CO Q-10) 100 MG CAPS [id number] No Take 100 mg by mouth daily.  [provider] Taking Active Self  Cyanocobalamin (B-12) 500 MCG TABS [id number] No Take 500 mcg by mouth daily. [provider] Taking Active Self  diltiazem  (CARDIZEM ) 30 MG tablet [id number] No Take 1 tablet (30 mg total) by mouth every 6 (six) hours as needed (fast heart rate, heart palpitations). Claudene Victory ORN, MD Taking Active            Med Note JACKOLYN, [place] R   Thu Apr 10, 2023 10:54 AM)    empagliflozin  (JARDIANCE ) 10 MG TABS tablet [id number] No Take 1 tablet (10 mg total) by mouth daily before breakfast. Berneta Elsie Sayre, MD Taking Active            Med Note JACKOLYN, [place] R   Thu Apr 10, 2023 10:54 AM)    fluticasone  (FLONASE ) 50 MCG/ACT nasal spray [id number] No Place 2 sprays into both nostrils daily. Webb, Padonda B, FNP Taking Active   furosemide  (LASIX ) 20 MG tablet [id number] No Take 1 tablet (20 mg total) by mouth daily as needed (for lower extremity swelling). Hobart Powell BRAVO, MD Taking Active            Med Note JACKOLYN, [place] R   Thu Apr 10, 2023 10:54 AM)    glucose blood (ONETOUCH ULTRA TEST) test strip [id number] No Use to check blood sugar up to three times daily Sebastian Beverley NOVAK, MD Taking Active   hydrALAZINE  (APRESOLINE ) 50 MG tablet [id number]  Take 1 tablet (50 mg total) by mouth 3 (three) times daily. Raford Riggs, MD  Active   ipratropium (ATROVENT ) 0.06 % nasal spray [id number] No Place 2 sprays into both nostrils 4 (four) times daily. Lorin Norris, MD Taking Active    Lancets [facility] DELICA PLUS [place]) MISC [id number]  1 each by Does not apply route in the morning, at noon, and at bedtime. Sebastian Beverley NOVAK, MD  Active   losartan  (COZAAR ) 100 MG tablet [id number] No Take 1 tablet (100 mg total) by mouth daily. Discontinue losartan benson Raford Riggs, MD Taking Active   Menthol, Topical Analgesic, (BLUE-EMU MAXIMUM STRENGTH) 2.5 % LIQD [id number] No Apply 1 application topically daily as needed (pain). [provider] Taking Active Self  metoprolol  succinate (TOPROL -XL) 25 MG 24 hr tablet [id number] No Take 1 tablet (25 mg total) by mouth daily. Sebastian Beverley NOVAK, MD Taking Active   Olopatadine  HCl 0.2 % SOLN [id number] No Apply 1 drop to eye in the morning and at bedtime. Lorin Norris, MD Taking Active   omeprazole  (PRILOSEC ) 20 MG  capsule [id number] No Take 1 capsule (20 mg total) by mouth daily. Charlanne Groom, MD Taking Active   Semaglutide , 1 MG/DOSE, (OZEMPIC , 1 MG/DOSE,) 4 MG/3ML SOPN [id number] No Inject 1 mg into the skin once a week. [provider] Taking Active            Med Note ZENA, CHERYL A   Thu May 29, 2023  3:14 PM) PAP  spironolactone  (ALDACTONE ) 25 MG tablet [id number] No Take 1 tablet (25 mg total) by mouth daily. Raford Riggs, MD Taking Active   STIOLTO RESPIMAT  2.5-2.5 MCG/ACT AERS [id number]  INHALE 2 PUFFS BY MOUTH ONCE DAILY Olalere, Adewale A, MD  Active   Zinc 50 MG TABS [id number] No Take 50 mg by mouth daily. [provider] Taking Active Self            Recommendation:   Continue Current Plan of Care  Follow Up Plan:   Telephone follow up appointment date/time:  08/25/23 at 2:00  SIG  Olam Idol BSN RN CCM [place]  [facility] RN Care Manager Direct Dial: [phone] Fax: [phone]

## 2023-07-24 NOTE — Patient Instructions (Signed)
 Visit Information  Thank you for taking time to visit with me today. Please don't hesitate to contact me if I can be of assistance to you before our next scheduled appointment.  Our next appointment is by telephone on 08/25/23 at 2:00 Please call the care guide team at [phone] if you need to cancel or reschedule your appointment.   Following is a copy of your care plan:   Goals Addressed             This Visit's Progress    [facility] RN Care Plan   On track    Problems:  Chronic Disease Management support and education needs related to Atrial Fibrillation, DMII, and HTN  Goal: Over the next 30 days the Patient will attend all scheduled medical appointments: 08/19/23 Nutriion, 08/11/23 CVD, 08/20/23 Pulmonology, 08/21/23 Pharmacy, 09/17/23 PCP as evidenced by chart review and patient report        take all medications exactly as prescribed and will call provider for medication related questions as evidenced by chart review and patient report     Interventions:   AFIB Interventions:   Counseled on increased risk of stroke due to Afib and benefits of anticoagulation for stroke prevention Reviewed importance of adherence to anticoagulant exactly as prescribed Counseled on bleeding risk associated with Eliquis  and importance of self-monitoring for signs/symptoms of bleeding Counseled on avoidance of NSAIDs due to increased bleeding risk with anticoagulants Counseled on importance of regular laboratory monitoring as prescribed Counseled on seeking medical attention after a head injury or if there is blood in the urine/stool Screening for signs and symptoms of depression related to chronic disease state  Assessed social determinant of health barriers   Diabetes Interventions: Assessed patient's understanding of A1c goal: <6.5%  Provided education to patient about basic DM disease process Reviewed medications with patient and discussed importance of medication adherence Counseled on importance  of regular laboratory monitoring as prescribed Discussed plans with patient for ongoing care management follow up and provided patient with direct contact information for care management team A1C increased from 5.8 to 5.9.  Patient has initiated exercise program at [name], has Diabetic Nutrition appt scheduled, and is hopeful that she will see improvement at next PCP visit. Lab Results  Component Value Date   HGBA1C 5.9 (A) 06/17/2023    Hypertension Interventions: Last practice recorded BP readings:  BP Readings from Last 3 Encounters:  07/24/23 134/74  06/24/23 109/67  06/17/23 120/68   Most recent eGFR/CrCl:  Lab Results  Component Value Date   EGFR 44 (L) 04/12/2022    No components found for: CRCL  Evaluation of current treatment plan related to hypertension self management and patient's adherence to plan as established by provider Provided education to patient re: stroke prevention, s/s of heart attack and stroke Reviewed medications with patient and discussed importance of compliance Discussed plans with patient for ongoing care management follow up and provided patient with direct contact information for care management team Advised patient, providing education and rationale, to monitor blood pressure daily and record, calling PCP for findings outside established parameters Discussed complications of poorly controlled blood pressure such as heart disease, stroke, circulatory complications, vision complications, kidney impairment, sexual dysfunction Screening for signs and symptoms of depression related to chronic disease state  Assessed social determinant of health barriers  Patient Self-Care Activities:  Attend all scheduled provider appointments Attend church or other social activities Call pharmacy for medication refills 3-7 days in advance of running out of medications Call provider  office for new concerns or questions  Perform all self care activities independently   Perform IADL's (shopping, preparing meals, housekeeping, managing finances) independently Take medications as prescribed   Work with the pharmacist to address medication management needs and will continue to work with the clinical team to address health care and disease management related needs schedule appointment with eye doctor check blood sugar at prescribed times: once daily check feet daily for cuts, sores or redness enter blood sugar readings and medication or insulin  into daily log take the blood sugar log to all doctor visits trim toenails straight across drink 6 to 8 glasses of water each day manage portion size keep feet up while sitting wash and dry feet carefully every day wear comfortable, cotton socks wear comfortable, well-fitting shoes keep all lab appointments take medicine as prescribed check blood pressure daily write blood pressure results in a log or diary keep a blood pressure log take blood pressure log to all doctor appointments call doctor for signs and symptoms of high blood pressure keep all doctor appointments take medications for blood pressure exactly as prescribed report new symptoms to your doctor  Plan:  Telephone follow up appointment with care management team member scheduled for:  08/11//25 at 2:00             Please call the Suicide and Crisis Lifeline: 988 call the [place]  National Suicide Prevention Lifeline: 516-218-4219 or TTY: 910-586-0650 TTY 2565054820) to talk to a trained counselor call 1-800-273-TALK (toll free, 24 hour hotline) if you are experiencing a Mental Health or Behavioral Health Crisis or need someone to talk to.  Patient verbalizes understanding of instructions and care plan provided today and agrees to view in MyChart. Active MyChart status and patient understanding of how to access instructions and care plan via MyChart confirmed with patient.     SIGNATURE  Olam Idol BSN RN CCM [place]  [facility] RN Care Manager Direct Dial: [phone] Fax: [phone]

## 2023-07-28 ENCOUNTER — Telehealth: Payer: Self-pay

## 2023-07-28 NOTE — Progress Notes (Signed)
 [employer] [facility] Weekly Session  Patient Details  Name: Rose Miller MRN: [id number] Date of Birth: 1955/12/17 Age: 68 y.o. PCP: Sebastian Beverley NOVAK, MD  Vitals:   07/28/23 1305  Weight: (!) 361 lb 3.2 oz (163.8 kg)     [employer] Weekly seesion - 07/28/23 1300       [employer] PRE[facility] Location   [facility] PRE[facility] Location [facility]      Weekly Session   Topic Discussed Finding support   [employer] membership talk w/Hanh   Minutes exercised this week 155 minutes    Classes attended to date 80          Kahil Agner B Meliton Samad 07/28/2023, 1:06 PM

## 2023-07-28 NOTE — Telephone Encounter (Signed)
 Sent pt a [facility] message to notify.

## 2023-07-31 ENCOUNTER — Telehealth: Payer: Self-pay

## 2023-07-31 NOTE — Telephone Encounter (Signed)
 Dose/ change order form faxed to Provider for review on Ozempic  2mg  for Patient per [facility] request for the clinic.  Will forward to Novo Nordisk when received with signature.

## 2023-07-31 NOTE — Progress Notes (Signed)
   07/31/2023  Patient ID: Rose Miller, female   DOB: Jan 01, 1956, 68 y.o.   MRN: [id number]  Patient increasing to Ozempic  2mg  weekly after completing the 1mg  pens she had at home from Novo PAP.  A new shipment of Ozempic  1mg  recently arrived at Dr. Oswald office versus the 2mg  pens patient was expecting.  Medication Assistance team has faxed order change form for 2mg  pens to the office for Dr. Sebastian to sign and return.  Attempted to contact patient to make her aware and advise she go ahead and pick up 1mg  pens to use in the mean time (can continue with 1mg  dose or give 2 injections to equal 2mg ).  I was not able to reach the patient but did leave a HIPAA compliant voicemail with my direct phone number, and I am sending a MyChart message, also.  Channing DELENA Mealing, PharmD, DPLA

## 2023-08-03 ENCOUNTER — Other Ambulatory Visit: Payer: Self-pay

## 2023-08-04 ENCOUNTER — Telehealth: Payer: Self-pay

## 2023-08-04 NOTE — Progress Notes (Signed)
 [employer] [facility] Weekly Session  Patient Details  Name: Rose Miller MRN: [id number] Date of Birth: 03-10-1955 Age: 68 y.o. PCP: Sebastian Beverley NOVAK, MD  Vitals:   08/04/23 1434  Weight: (!) 358 lb 9.6 oz (162.7 kg)     [employer] Weekly seesion - 08/04/23 1400       [employer] PRE[facility] Location   [facility] PRE[facility] Location [facility]      Weekly Session   Topic Discussed Hitting roadblocks   Reviewed full program lifestyle change Rx; reviewed end of program survey and setting goals/activity plan for next 3 months; Review of 100 calorie snack food labels.   Minutes exercised this week 135 minutes    Classes attended to date 77          Josely Moffat B Kasee Hantz 08/04/2023, 2:42 PM

## 2023-08-04 NOTE — Progress Notes (Signed)
   08/04/2023  Patient ID: Rose Miller, female   DOB: 11-12-1955, 68 y.o.   MRN: [id number]  Returning missed call/voicemail from patient from earlier in the day.  [name] has 1 box of Ozempic  1mg  remaining at home from previous PAP delivery.  She will use 1st dose from this box tomorrow, Tuesday, 722.  Patient prefers to hold off picking up 1mg  pens from [place] and wait until 2mg  pens arrive.  Order change form faxed to office for Dr. Sebastian to sign for 2mg  dose increase.  I will verify this has been completed by Wednesday to make sure medication will be received before patient runs out of 1mg  doses on hand.  Channing DELENA Mealing, PharmD, DPLA

## 2023-08-05 DIAGNOSIS — R600 Localized edema: Secondary | ICD-10-CM | POA: Diagnosis not present

## 2023-08-05 DIAGNOSIS — B351 Tinea unguium: Secondary | ICD-10-CM | POA: Diagnosis not present

## 2023-08-05 DIAGNOSIS — M19071 Primary osteoarthritis, right ankle and foot: Secondary | ICD-10-CM | POA: Diagnosis not present

## 2023-08-05 DIAGNOSIS — I70203 Unspecified atherosclerosis of native arteries of extremities, bilateral legs: Secondary | ICD-10-CM | POA: Diagnosis not present

## 2023-08-05 DIAGNOSIS — E1142 Type 2 diabetes mellitus with diabetic polyneuropathy: Secondary | ICD-10-CM | POA: Diagnosis not present

## 2023-08-05 DIAGNOSIS — M2041 Other hammer toe(s) (acquired), right foot: Secondary | ICD-10-CM | POA: Diagnosis not present

## 2023-08-05 DIAGNOSIS — M792 Neuralgia and neuritis, unspecified: Secondary | ICD-10-CM | POA: Diagnosis not present

## 2023-08-05 DIAGNOSIS — M19072 Primary osteoarthritis, left ankle and foot: Secondary | ICD-10-CM | POA: Diagnosis not present

## 2023-08-07 ENCOUNTER — Other Ambulatory Visit (HOSPITAL_BASED_OUTPATIENT_CLINIC_OR_DEPARTMENT_OTHER): Payer: Self-pay | Admitting: Cardiovascular Disease

## 2023-08-07 ENCOUNTER — Other Ambulatory Visit (HOSPITAL_COMMUNITY): Payer: Self-pay

## 2023-08-07 DIAGNOSIS — Z7901 Long term (current) use of anticoagulants: Secondary | ICD-10-CM

## 2023-08-07 DIAGNOSIS — Z79899 Other long term (current) drug therapy: Secondary | ICD-10-CM

## 2023-08-07 DIAGNOSIS — Z952 Presence of prosthetic heart valve: Secondary | ICD-10-CM

## 2023-08-07 DIAGNOSIS — I10 Essential (primary) hypertension: Secondary | ICD-10-CM

## 2023-08-07 DIAGNOSIS — E782 Mixed hyperlipidemia: Secondary | ICD-10-CM

## 2023-08-07 DIAGNOSIS — I350 Nonrheumatic aortic (valve) stenosis: Secondary | ICD-10-CM

## 2023-08-07 DIAGNOSIS — I351 Nonrheumatic aortic (valve) insufficiency: Secondary | ICD-10-CM

## 2023-08-08 ENCOUNTER — Other Ambulatory Visit (HOSPITAL_COMMUNITY): Payer: Self-pay

## 2023-08-09 ENCOUNTER — Other Ambulatory Visit: Payer: Self-pay

## 2023-08-09 ENCOUNTER — Other Ambulatory Visit (HOSPITAL_COMMUNITY): Payer: Self-pay

## 2023-08-09 MED FILL — Furosemide Tab 20 MG: ORAL | 30 days supply | Qty: 30 | Fill #0 | Status: CN

## 2023-08-09 NOTE — Telephone Encounter (Signed)
 OK to refill

## 2023-08-11 ENCOUNTER — Other Ambulatory Visit (HOSPITAL_COMMUNITY): Payer: Self-pay

## 2023-08-11 ENCOUNTER — Encounter (HOSPITAL_BASED_OUTPATIENT_CLINIC_OR_DEPARTMENT_OTHER): Payer: Self-pay | Admitting: Family

## 2023-08-11 ENCOUNTER — Other Ambulatory Visit: Payer: Self-pay

## 2023-08-11 ENCOUNTER — Ambulatory Visit (HOSPITAL_BASED_OUTPATIENT_CLINIC_OR_DEPARTMENT_OTHER): Admitting: Family

## 2023-08-11 VITALS — BP 122/68 | HR 82 | Ht 70.0 in | Wt 355.4 lb

## 2023-08-11 DIAGNOSIS — Z79899 Other long term (current) drug therapy: Secondary | ICD-10-CM | POA: Diagnosis not present

## 2023-08-11 DIAGNOSIS — I10 Essential (primary) hypertension: Secondary | ICD-10-CM | POA: Diagnosis not present

## 2023-08-11 DIAGNOSIS — Z952 Presence of prosthetic heart valve: Secondary | ICD-10-CM

## 2023-08-11 DIAGNOSIS — D6859 Other primary thrombophilia: Secondary | ICD-10-CM | POA: Diagnosis not present

## 2023-08-11 MED ORDER — FUROSEMIDE 20 MG PO TABS
20.0000 mg | ORAL_TABLET | Freq: Every day | ORAL | 4 refills | Status: AC | PRN
  Filled 2023-08-11: qty 30, 30d supply, fill #0
  Filled 2023-12-16: qty 30, 30d supply, fill #1

## 2023-08-11 NOTE — Patient Instructions (Signed)
 Medication Instructions:  Continue your current medications  *If you need a refill on your cardiac medications before your next appointment, please call your pharmacy*  Lab Work: Your physician recommends that you return for lab work today: BMET, CBC  If you have labs (blood work) drawn today and your tests are completely normal, you will receive your results only by: MyChart Message (if you have MyChart) OR A paper copy in the mail If you have any lab test that is abnormal or we need to change your treatment, we will call you to review the results.   Follow-Up: At [facility], you and your health needs are our priority.  As part of our continuing mission to provide you with exceptional heart care, our providers are all part of one team.  This team includes your primary Cardiologist (physician) and Advanced Practice Providers or APPs (Physician Assistants and Nurse Practitioners) who all work together to provide you with the care you need, when you need it.  Your next appointment:   4-6 month(s)  Provider:   Annabella Scarce, MD    We recommend signing up for the patient portal called MyChart.  Sign up information is provided on this After Visit Summary.  MyChart is used to connect with patients for Virtual Visits (Telemedicine).  Patients are able to view lab/test results, encounter notes, upcoming appointments, etc.  Non-urgent messages can be sent to your provider as well.   To learn more about what you can do with MyChart, go to [email or web address].   Other Instructions  Heart Healthy Diet Recommendations: A low-salt diet is recommended. Meats should be grilled, baked, or boiled. Avoid fried foods. Focus on lean protein sources like fish or chicken with vegetables and fruits. The American Heart Association is a Chief Technology Officer!  American Heart Association Diet and Lifeystyle Recommendations   Exercise recommendations: The American Heart Association recommends 150  minutes of moderate intensity exercise weekly. Try 30 minutes of moderate intensity exercise 4-5 times per week. This could include walking, jogging, or swimming.

## 2023-08-11 NOTE — Progress Notes (Signed)
 Cardiology Office Note   Date:  08/11/2023  ID:  Rose, Miller 1955/02/09, MRN [id number] PCP: Rose Beverley NOVAK, MD  [facility] Providers Cardiologist:  Rose Scarce, MD     History of Present Illness Rose Miller is a 68 y.o. female with history of aortic stenosis s/p TAVR 12/2020, mild nonobstructive CAD, PAF, HTN, HLD, COPD, morbid obesity.  After TAVR she had episodes of bradycardia metoprolol  discontinued.  Less than 04/10/2023 with BP well-controlled.  She was unaware of atrial fibrillation.  Lower extremity edema which improved overnight in the setting of high sodium diet.  She was referred to the PREP exercise program  Presents today for follow-up with her son.  Doing overall well since last seen.  Enjoying participating in the PREP exercise program.  She reports no chest pain, palpitations.  Mild lower extremity edema in her ankles worse by end of day that resolves with elevation.  She reports stable mild exertional dyspnea with more than usual activity.  BP when checked at home has been in the 120s.  ROS: Please see the history of present illness.    All other systems reviewed and are negative.   Studies Reviewed      Cardiac Studies & Procedures   ______________________________________________________________________________________________ CARDIAC CATHETERIZATION  CARDIAC CATHETERIZATION 11/24/2020  Conclusion Mildly dilated ascending aorta. Moderately severe aortic regurgitation.  Mild aortic stenosis. Moderately severe elevation in left ventricular end-diastolic pressure, 32 mmHg. Elevated mean pulmonary wedge pressure, 24 mmHg.  V wave 40 mmHg. Mild pulmonary hypertension with PA mean 31 mmHg and phasic pressure 54 over 17 mmHg. Widely patent coronary arteries without anomalous origin.  RECOMMENDATION:  Better blood pressure control. Surgical consultation for comanagement.  Patient is not limited by symptoms but is quite  sedentary. Echocardiography suggests severe aortic regurgitation although this is not borne out by angiography.  Findings Coronary Findings Diagnostic  Dominance: Right  Right Coronary Artery Ost RCA lesion is 25% stenosed.  Intervention  No interventions have been documented.     ECHOCARDIOGRAM  ECHOCARDIOGRAM COMPLETE 04/07/2023  Narrative ECHOCARDIOGRAM REPORT    Patient Name:   Rose Miller Date of Exam: 04/07/2023 Medical Rec #:  [id number]        Height:       70.0 in Accession #:    [id number]       Weight:       359.8 lb Date of Birth:  [birth year]       BSA:          2.679 m Patient Age:    73 years         BP:           126/82 mmHg Patient Gender: F                HR:           74 bpm. Exam Location:  [address]  Procedure: 2D Echo, Cardiac Doppler and Color Doppler (Both Spectral and Color Flow Doppler were utilized during procedure).  Indications:    Z95.2 Status post TAVR  History:        Patient has prior history of Echocardiogram examinations, most recent 12/27/2022. TAVR-17mm Sapien 3 Ultra Resilia, COPD; Risk Factors:Hypertension, Dyslipidemia and Diabetes. Aortic Valve: 26 mm Sapien prosthetic, stented (TAVR) valve is present in the aortic position. Procedure Date: 12/26/2020.  Sonographer:    Rose Miller RDCS Referring Phys: [id number] Rose Miller  IMPRESSIONS   1. Left ventricular ejection fraction, by  estimation, is 65 to 70%. The left ventricle has normal function. The left ventricle has no regional wall motion abnormalities. There is moderate concentric left ventricular hypertrophy. Left ventricular diastolic parameters are indeterminate. 2. Right ventricular systolic function is normal. The right ventricular size is normal. Tricuspid regurgitation signal is inadequate for assessing PA pressure. 3. Left atrial size was mildly dilated. 4. The mitral valve is normal in structure. Trivial mitral valve regurgitation. No  evidence of mitral stenosis. 5. The aortic valve has been repaired/replaced. Aortic valve regurgitation is not visualized. There is a 26 mm Sapien prosthetic (TAVR) valve present in the aortic position. Procedure Date: 12/26/2020. Echo findings are consistent with normal structure and function of the aortic valve prosthesis. Aortic valve mean gradient measures 12.8 mmHg. 6. Aortic dilatation noted. There is borderline dilatation of the ascending aorta, measuring 39 mm. 7. The inferior vena cava is normal in size with greater than 50% respiratory variability, suggesting right atrial pressure of 3 mmHg.  Comparison(s): Prior images reviewed side by side. Changes from prior study are noted. TAVR gradient improved from 23 to 12.8 mmHg.  FINDINGS Left Ventricle: Left ventricular ejection fraction, by estimation, is 65 to 70%. The left ventricle has normal function. The left ventricle has no regional wall motion abnormalities. The left ventricular internal cavity size was normal in size. There is moderate concentric left ventricular hypertrophy. Left ventricular diastolic parameters are indeterminate.  Right Ventricle: The right ventricular size is normal. Right vetricular wall thickness was not well visualized. Right ventricular systolic function is normal. Tricuspid regurgitation signal is inadequate for assessing PA pressure.  Left Atrium: Left atrial size was mildly dilated.  Right Atrium: Right atrial size was normal in size.  Pericardium: There is no evidence of pericardial effusion.  Mitral Valve: The mitral valve is normal in structure. Trivial mitral valve regurgitation. No evidence of mitral valve stenosis.  Tricuspid Valve: The tricuspid valve is grossly normal. Tricuspid valve regurgitation is not demonstrated. No evidence of tricuspid stenosis.  The aortic valve has been repaired/replaced. Aortic valve regurgitation is not visualized. There is a 26 mm Sapien prosthetic, stented (TAVR)  valve present in the aortic position. Procedure Date: 12/26/2020. Echo findings are consistent with normal structure and function of the aortic valve prosthesis. Pulmonic Valve: The pulmonic valve was not well visualized. Pulmonic valve regurgitation is not visualized.  Aorta: Aortic dilatation noted. There is borderline dilatation of the ascending aorta, measuring 39 mm.  Venous: The inferior vena cava is normal in size with greater than 50% respiratory variability, suggesting right atrial pressure of 3 mmHg.  IAS/Shunts: The atrial septum is grossly normal.   LEFT VENTRICLE PLAX 2D LVIDd:         4.80 cm Diastology LVIDs:         2.90 cm LV e' medial:    6.15 cm/s LV PW:         1.75 cm LV E/e' medial:  17.2 LV IVS:        1.71 cm LV e' lateral:   4.95 cm/s LV E/e' lateral: 21.4   RIGHT VENTRICLE             IVC RV Basal diam:  3.25 cm     IVC diam: 1.20 cm RV S prime:     16.90 cm/s TAPSE (M-mode): 2.7 cm  LEFT ATRIUM             Index        RIGHT ATRIUM  Index LA diam:        4.70 cm 1.75 cm/m   RA Area:     11.00 cm LA Vol (A2C):   44.9 ml 16.76 ml/m  RA Volume:   24.00 ml  8.96 ml/m LA Vol (A4C):   72.6 ml 27.10 ml/m LA Biplane Vol: 57.6 ml 21.50 ml/m AORTIC VALVE AV Vmax:           240.20 cm/s AV Vmean:          165.200 cm/s AV VTI:            0.539 m AV Peak Grad:      23.1 mmHg AV Mean Grad:      12.8 mmHg LVOT Vmax:         129.67 cm/s LVOT Vmean:        86.733 cm/s LVOT VTI:          0.307 m LVOT/AV VTI ratio: 0.57  AORTA Ao Root diam: 3.50 cm Ao Asc diam:  3.90 cm  MITRAL VALVE MV Area (PHT): 2.93 cm     SHUNTS MV Decel Time: 259 msec     Systemic VTI: 0.31 m MV E velocity: 106.00 cm/s MV A velocity: 109.50 cm/s MV E/A ratio:  0.97  Rose Bruckner MD Electronically signed by Rose Bruckner MD Signature Date/Time: 04/07/2023/5:51:34 PM    Final    MONITORS  LONG TERM MONITOR-LIVE TELEMETRY (3-14 DAYS)  01/16/2021  Narrative  Paroxysmal atrial fibrillation with RVR. Burden 4%  Otherwise NSR with occational PAC's, PVC's, and SVT.  PVC and PAC burden < 1%   Patch Wear Time:  14 days and 0 hours (2022-12-14T12:16:42-0500 to 2022-12-28T12:16:42-0500)  Patient had a min HR of 40 bpm, max HR of 195 bpm, and avg HR of 71 bpm. Predominant underlying rhythm was Sinus Rhythm. 1 run of Ventricular Tachycardia occurred lasting 4 beats with a max rate of 169 bpm (avg 164 bpm). 36 Supraventricular Tachycardia runs occurred, the run with the fastest interval lasting 4 beats with a max rate of 188 bpm, the longest lasting 11.9 secs with an avg rate of 116 bpm. Atrial Fibrillation occurred (4% burden), ranging from 57-195 bpm (avg of 107 bpm), the longest lasting 7 hours 13 mins with an avg rate of 100 bpm. Isolated SVEs were rare (<1.0%), SVE Couplets were rare (<1.0%), and SVE Triplets were rare (<1.0%). Isolated VEs were rare (<1.0%, 2949), VE Couplets were rare (<1.0%, 91), and VE Triplets were rare (<1.0%, 1). Ventricular Bigeminy and Trigeminy were present. Previously Notified: MD notification criteria for First Documentation of Atrial Fibrillation met - report posted prior to notification per account request (CL).   CT SCANS  CT CORONARY MORPH W/CTA COR W/SCORE 12/14/2020  Addendum 12/14/2020  2:12 PM ADDENDUM REPORT: 12/14/2020 14:09  CLINICAL DATA:  Aortic stenosis  EXAM: Cardiac TAVR CT  TECHNIQUE: The patient was scanned on a Siemens Force 192 slice scanner. A 120 kV retrospective scan was triggered in the descending thoracic aorta at 111 HU's. Gantry rotation speed was 270 msecs and collimation was .9 mm. No beta blockade or nitro were given. The 3D data set was reconstructed in 5% intervals of the R-R cycle. Systolic and diastolic phases were analyzed on a dedicated work station using MPR, MIP and VRT modes. The patient received 80 cc of contrast.  FINDINGS: Aortic Valve: Tri leaflet  calcified valve with restricted leaflet motion calcium  score 1835  Aorta: No aneurysm normal arch vessels mild calcific atherosclerosis  Sinotubular  Junction: 30 mm  Ascending Thoracic Aorta: 37 mm  Aortic Arch: 31 mm  Descending Thoracic Aorta: 27 mm  Sinus of Valsalva Measurements:  Non-coronary: 30.4 mm  Right - coronary: 28.2 mm  Left - coronary: 29 mm  Coronary Artery Height above Annulus:  Left Main: 13.1 mm above annulus  Right Coronary: 16 mm above annulus  Virtual Basal Annulus Measurements:  Maximum/Minimum Diameter: 26.3 mm x 21.1 mm  Perimeter: 80.4 mm  Area: 479 mm2  Coronary Arteries: Sufficient height above annulus for deployment  Optimum Fluoroscopic Angle for Delivery: LAO 10 Cranial 4 degrees  IMPRESSION: Poor quality study.  Poor opacification of aorta  1.  Calcified tri leaflet AV with calcium  score 1835  2.  Annular area of 479 mm2 suitable for a 26 mm Sapien 3 valve  3.  Coronary arteries sufficient height above annulus for deployment  4. Optimum angiographic angle for deployment LAO 10 cranial 4 degrees  Rose Miller   Electronically Signed By: Rose Miller M.D. On: 12/14/2020 14:09  Narrative EXAM: OVER-READ INTERPRETATION  CT CHEST  The following report is an over-read performed by radiologist Dr. Toribio Aye of [facility] on 12/14/2020. This over-read does not include interpretation of cardiac or coronary anatomy or pathology. The coronary calcium  score/coronary CTA interpretation by the cardiologist is attached.  COMPARISON:  None.  FINDINGS: Extracardiac findings will be described separately under dictation for contemporaneously obtained CTA chest, abdomen and pelvis.  IMPRESSION: Please see separate dictation for contemporaneously obtained CTA chest, abdomen and pelvis dated 12/14/2020 for full description of relevant extracardiac findings.  Electronically Signed: By: Toribio Aye  M.D. On: 12/14/2020 11:47     ______________________________________________________________________________________________      Risk Assessment/Calculations  CHA2DS2-VASc Score = 5   This indicates a 7.2% annual risk of stroke. The patient's score is based upon: CHF History: 1 HTN History: 1 Diabetes History: 1 Stroke History: 0 Vascular Disease History: 0 Age Score: 1 Gender Score: 1            Physical Exam VS:  BP 122/68 (BP Location: Left Arm, Patient Position: Sitting, Cuff Size: Normal)   Pulse 82   Ht 5' 10 (1.778 m)   Wt (!) 355 lb 6.4 oz (161.2 kg)   SpO2 94%   BMI 50.99 kg/m        Wt Readings from Last 3 Encounters:  08/11/23 (!) 355 lb 6.4 oz (161.2 kg)  08/04/23 (!) 358 lb 9.6 oz (162.7 kg)  07/28/23 (!) 361 lb 3.2 oz (163.8 kg)    GEN: Well nourished, overweight, well developed in no acute distress NECK: No JVD; No carotid bruits CARDIAC: RRR, no murmurs, rubs, gallops RESPIRATORY:  Clear to auscultation without rales, wheezing or rhonchi  ABDOMEN: Soft, non-tender, non-distended EXTREMITIES:  Non pitting bilateral ankle edema; No deformity   ASSESSMENT AND PLAN  HTN- BP well controlled. Continue current antihypertensive regimen amlodipine  10 mg daily, hydralazine  50 mg 3 times daily, losartan  100 mg daily, Toprol  25 mg daily, Spiro 25 mg daily. Discussed to monitor BP at home at least 2 hours after medications and sitting for 5-10 minutes. If SBP routinely <110 consider reducing antihypertensive regimen.  Venous insufficiency- Leg elevation, low sodium diet encouraged. Using PRN Lasix  20mg  sparingly, refill provided.   Aortic stenosis s/p TAVR- no heart failure symptoms. Stable on echo 03/2022.  PAF / Hypercoagulable state - no recent palpitations. Continue toprol  25mg  daily and diltizem 30mg  PRN. CHA2DS2-VASc Score = 5 [CHF History:  1, HTN History: 1, Diabetes History: 1, Stroke History: 0, Vascular Disease History: 0, Age Score: 1, Gender Score:  1].  Therefore, the patient's annual risk of stroke is 7.2 %.    Continue eliquis  5mg  BID. Does not meet dose reduction criteria. Update CBC, BMET for monitoring.   Nonobstructive CAD/aortic atherosclerosis/HLD- Stable with no anginal symptoms. No indication for ischemic evaluation.  GDMT Toprol  25 mg daily, atorvastatin  10 mg daily.  No aspirin  due to OAC. Recommend aiming for 150 minutes of moderate intensity activity per week and following a heart healthy diet.    Morbid obesity / DM2 - Weight loss via diet and exercise encouraged. Discussed the impact being overweight would have on cardiovascular risk.  Congratulated weight loss.  Plans to increase Ozempic  to 2 mg dose with prescribing provider.       Dispo: follow up in 4-6 mos  Signed, Reche GORMAN Finder, NP

## 2023-08-11 NOTE — Progress Notes (Signed)
 [employer] PREP Weekly Session  Patient Details  Name: Rose Miller MRN: [id number] Date of Birth: [birth year] Age: 68 y.o. PCP: Sebastian Beverley NOVAK, MD  Vitals:   08/11/23 1315  Weight: (!) 357 lb 9.6 oz (162.2 kg)     [facility] Weekly seesion - 08/11/23 1300       [facility] PREP Location   [facility] PREP Location [facility]      Weekly Session   Topic Discussed Other   Fit testing, how fit and strong survey completed; appt scheduled Wed for final assessment visit; shared healthy habit meditation   Minutes exercised this week 170 minutes    Classes attended to date 74          Rose Miller Rose Miller 08/11/2023, 1:16 PM

## 2023-08-12 ENCOUNTER — Other Ambulatory Visit: Payer: Self-pay

## 2023-08-12 ENCOUNTER — Ambulatory Visit (HOSPITAL_BASED_OUTPATIENT_CLINIC_OR_DEPARTMENT_OTHER): Payer: Self-pay | Admitting: Family

## 2023-08-12 DIAGNOSIS — D6859 Other primary thrombophilia: Secondary | ICD-10-CM

## 2023-08-12 DIAGNOSIS — I10 Essential (primary) hypertension: Secondary | ICD-10-CM

## 2023-08-12 LAB — BASIC METABOLIC PANEL WITH GFR
BUN/Creatinine Ratio: 14 (ref 12–28)
BUN: 20 mg/dL (ref 8–27)
Calcium: 9.2 mg/dL (ref 8.7–10.3)
Chloride: 108 mmol/L — ABNORMAL HIGH (ref 96–106)
Creatinine, Ser: 1.4 mg/dL — ABNORMAL HIGH (ref 0.57–1.00)
Glucose: 95 mg/dL (ref 70–99)
Potassium: 4.5 mmol/L (ref 3.5–5.2)
Sodium: 142 mmol/L (ref 134–144)
eGFR: 41 mL/min/1.73 — ABNORMAL LOW (ref >59)

## 2023-08-12 LAB — CBC
Hematocrit: 35.9 % (ref 34.0–46.6)
Hemoglobin: 10.8 g/dL — ABNORMAL LOW (ref 11.1–15.9)
MCH: 25.1 pg — ABNORMAL LOW (ref 26.6–33.0)
MCHC: 30.1 g/dL — ABNORMAL LOW (ref 31.5–35.7)
MCV: 84 fL (ref 79–97)
NRBC: 1 % — ABNORMAL HIGH (ref 0–0)
Platelets: 202 x10E3/uL (ref 150–450)
RBC: 4.3 x10E6/uL (ref 3.77–5.28)
RDW: 17 % — ABNORMAL HIGH (ref 11.7–15.4)
WBC: 4.8 x10E3/uL (ref 3.4–10.8)

## 2023-08-13 NOTE — Progress Notes (Signed)
 [employer] PREP Evaluation  Patient Details  Name: Rose Miller MRN: [id number] Date of Birth: 08/20/1955 Age: 68 y.o. PCP: Sebastian Beverley NOVAK, MD  Vitals:   08/13/23 1115  BP: 112/68  Pulse: 72  SpO2: 96%  Weight: (!) 357 lb 9.6 oz (162.2 kg)     [facility] Eval - 08/13/23 1100       [facility] PREP Location   [facility] PREP Location [facility]      Referral    Program Start Date 05/26/23    Program End Date 08/13/23      Measurement   Waist Circumference 58 inches    Waist Circumference End Program 57.5 inches    Hip Circumference 65 inches    Hip Circumference End Program 64 inches    Body fat --   E4     Mobility and Daily Activities   I find it easy to walk up or down two or more flights of stairs. 2    I have no trouble taking out the trash. 4    I do housework such as vacuuming and dusting on my own without difficulty. 2    I can easily lift a gallon of milk (8lbs). 4    I can easily walk a mile. 1    I have no trouble reaching into high cupboards or reaching down to pick up something from the floor. 2    I do not have trouble doing out-door work such as Loss adjuster, chartered, raking leaves, or gardening. 1      Mobility and Daily Activities   I feel younger than my age. 2    I feel independent. 2    I feel energetic. 2    I live an active life.  1    I feel strong. 2    I feel healthy. 2    I feel active as other people my age. 1      How fit and strong are you.   Fit and Strong Total Score 28         Past Medical History:  Diagnosis Date   Adhesive capsulitis    Aortic valve disease    Arthritis    Asthma    Back pain    CKD (chronic kidney disease) stage 3, GFR 30-59 ml/min (HCC)    Constipation    COPD (chronic obstructive pulmonary disease) (HCC)    Diabetes mellitus without complication (HCC)    Diuretic-induced hypokalemia    Hypercholesterolemia    Hypertension    IT band syndrome    Morbid obesity (HCC)    Palpitations    Periumbilical pain     Postmenopausal estrogen deficiency    Prediabetes    S/P TAVR (transcatheter aortic valve replacement) 12/26/2020   Ventral hernia    Past Surgical History:  Procedure Laterality Date   BIOPSY  05/01/2021   Procedure: BIOPSY;  Surgeon: Charlanne Groom, MD;  Location: [facility];  Service: Gastroenterology;;   CESAREAN SECTION  1989   COLONOSCOPY     COLONOSCOPY WITH PROPOFOL  N/A 05/01/2021   Procedure: COLONOSCOPY WITH PROPOFOL ;  Surgeon: Charlanne Groom, MD;  Location: [facility];  Service: Gastroenterology;  Laterality: N/A;   ESOPHAGOGASTRODUODENOSCOPY (EGD) WITH PROPOFOL  N/A 05/01/2021   Procedure: ESOPHAGOGASTRODUODENOSCOPY (EGD) WITH PROPOFOL ;  Surgeon: Charlanne Groom, MD;  Location: [facility];  Service: Gastroenterology;  Laterality: N/A;   INTRAOPERATIVE TRANSTHORACIC ECHOCARDIOGRAM N/A 12/26/2020   Procedure: INTRAOPERATIVE TRANSTHORACIC ECHOCARDIOGRAM;  Surgeon: Thukkani, Arun K, MD;  Location:  [facility];  Service: Open Heart Surgery;  Laterality: N/A;   MALONEY DILATION  05/01/2021   Procedure: AGAPITO DILATION;  Surgeon: Charlanne Groom, MD;  Location: [facility];  Service: Gastroenterology;;   POLYPECTOMY  05/01/2021   Procedure: POLYPECTOMY;  Surgeon: Charlanne Groom, MD;  Location: [facility];  Service: Gastroenterology;;   RIGHT/LEFT HEART CATH AND CORONARY ANGIOGRAPHY N/A 11/24/2020   Procedure: RIGHT/LEFT HEART CATH AND CORONARY ANGIOGRAPHY;  Surgeon: Claudene Victory ORN, MD;  Location: [facility];  Service: Cardiovascular;  Laterality: N/A;   TONSILLECTOMY AND ADENOIDECTOMY  1960   TRANSCATHETER AORTIC VALVE REPLACEMENT, TRANSFEMORAL N/A 12/26/2020   Procedure: TRANSCATHETER AORTIC VALVE REPLACEMENT, TRANSFEMORAL;  Surgeon: Wendel Lurena POUR, MD;  Location: [facility];  Service: Open Heart Surgery;  Laterality: N/A;   Social History   Tobacco Use  Smoking Status Former   Current packs/day: 0.00   Average packs/day: 1 pack/day for 38.0 years (38.0  ttl pk-yrs)   Types: Cigarettes   Start date: 27   Quit date: 2013   Years since quitting: 12.5   Passive exposure: Never  Smokeless Tobacco Never  Wt loss: 6.8 pounds Inches lost: 1.5 How fit and strong survey improved from 25 to 28 Sit to stands improved from 10 to 11 Bicep curls improved from 15 to 19 100% attendance, did not miss a class BP improved from 122/78 to 112/68  Demetrick Eichenberger B Tiani Stanbery 08/13/2023, 11:17 AM

## 2023-08-17 ENCOUNTER — Other Ambulatory Visit: Payer: Self-pay | Admitting: Pulmonary Disease

## 2023-08-19 ENCOUNTER — Encounter: Attending: Family Medicine | Admitting: Skilled Nursing Facility1

## 2023-08-19 ENCOUNTER — Encounter: Payer: Self-pay | Admitting: Skilled Nursing Facility1

## 2023-08-19 DIAGNOSIS — Z6841 Body Mass Index (BMI) 40.0 and over, adult: Secondary | ICD-10-CM | POA: Diagnosis not present

## 2023-08-19 DIAGNOSIS — E66813 Obesity, class 3: Secondary | ICD-10-CM | POA: Insufficient documentation

## 2023-08-19 DIAGNOSIS — E119 Type 2 diabetes mellitus without complications: Secondary | ICD-10-CM | POA: Diagnosis not present

## 2023-08-19 NOTE — Progress Notes (Signed)
 Medical Nutrition Therapy  Appointment Start time: 11:55  Appointment End time: 12:30  Primary concerns today: to lose weight   Referral diagnosis: DM   NUTRITION ASSESSMENT   Clinical Medical Hx: DM, hyperlipidemia, HTN, COPD, asthma, arthritis, CKD    Medications: see list Labs: A1C 5.8 Notable Signs/Symptoms: none reported   Lifestyle & Dietary Hx  Pt states she lives with her son and his girlfriend.  Pt arrives with her supportive son whom gave her supportive ideas to help her get started with making changes.  Pt states she is struggling to get to sleep earlier but has been trying some new foods. Pt states she has been trying to eat the right stuff so has not been buying ice cream.  Pt states she uses a peddler. Pt states her knees and back bother her. Pt states she has not been walking. Pt states she has been trying to not eat out as much.  Pt states she does struggle to make changes.   Estimated daily fluid intake:  oz Supplements:  Sleep: state she stays up all night pretty much Stress / self-care:  Current average weekly physical activity: exercise program through the [facility] (has now ended but stayed with [facility])  24-Hr Dietary Recall First Meal: eaten out or skipped or eggs and bacon and bread  Snack:  Second Meal: eaten out or skipped Snack:  Third Meal 8-9pm: fish sticks or eaten out or baked ziti and salad and crassoint  Snack: 11-12am ice cream Beverages: water   NUTRITION DIAGNOSIS  Inappropriate food choices (NI-5.7.2) Related to lack of motivation to change dietary habits despite knowledge of healthy meal preparation As evidenced by daily consumption of fast food and self-reported low motivation to adopt healthier eating patterns   NUTRITION INTERVENTION  Nutrition education (E-1) on the following topics: continued  Creation of balanced and diverse meals to increase the intake of nutrient-rich foods that provide essential vitamins, minerals, fiber, and  phytonutrients Variety of Fruits and Vegetables: Aim for a colorful array of fruits and vegetables to ensure a wide range of nutrients. Include a mix of leafy greens, berries, citrus fruits, cruciferous vegetables, and more. Whole Grains: Choose whole grains over refined grains. Examples include brown rice, quinoa, oats, whole wheat, and barley. Lean Proteins: Include lean sources of protein, such as poultry, fish, tofu, legumes, beans, lentils, and low-fat dairy products. Limit red and processed meats. Healthy Fats: Incorporate sources of healthy fats, including avocados, nuts, seeds, and olive oil. Limit saturated and trans fats found in fried and processed foods. Dairy or Dairy Alternatives: Choose low-fat or fat-free dairy products, or plant-based alternatives like almond or soy milk. Portion Control: Be mindful of portion sizes to avoid overeating. Pay attention to hunger and satisfaction cues. Limit Added Sugars: Minimize the consumption of sugary beverages, snacks, and desserts. Check food labels for added sugars and opt for natural sources of sweetness such as whole fruits. Hydration: Drink plenty of water throughout the day. Limit sugary drinks and excessive caffeine intake. Moderate Sodium Intake: Reduce the consumption of high-sodium foods. Use herbs and spices for flavor instead of excessive salt. Meal Planning and Preparation: Plan and prepare meals ahead of time to make healthier choices more convenient. Include a mix of food groups in each meal. Limit Processed Foods: Minimize the intake of highly processed and packaged foods that are often high in added sugars, salt, and unhealthy fats. Regular Physical Activity: Combine a healthy diet with regular physical activity for overall well-being. Aim for  at least 150 minutes of moderate-intensity aerobic exercise per week, along with strength training. Moderation and Balance: Enjoy treats and indulgent foods in moderation,  emphasizing balance rather than strict restriction.  Handouts Previously Provided Include  101 things to do  Learning Style & Readiness for Change Teaching method utilized: Visual & Auditory  Demonstrated degree of understanding via: Teach Back  Barriers to learning/adherence to lifestyle change: pre contemplative stage   Goals Established by Pt Work on small changes at a time such as a short walk   MONITORING & EVALUATION Dietary intake, weekly physical activity  Next Steps  Patient is to return in 4-6 weeks.

## 2023-08-20 ENCOUNTER — Other Ambulatory Visit: Payer: Self-pay | Admitting: Family Medicine

## 2023-08-20 ENCOUNTER — Other Ambulatory Visit: Payer: Self-pay

## 2023-08-20 ENCOUNTER — Other Ambulatory Visit (HOSPITAL_COMMUNITY): Payer: Self-pay

## 2023-08-20 ENCOUNTER — Ambulatory Visit: Admitting: Pulmonary Disease

## 2023-08-20 VITALS — BP 126/75 | HR 89 | Ht 70.0 in | Wt 357.0 lb

## 2023-08-20 DIAGNOSIS — Z122 Encounter for screening for malignant neoplasm of respiratory organs: Secondary | ICD-10-CM

## 2023-08-20 DIAGNOSIS — Z87891 Personal history of nicotine dependence: Secondary | ICD-10-CM

## 2023-08-20 DIAGNOSIS — J431 Panlobular emphysema: Secondary | ICD-10-CM

## 2023-08-20 DIAGNOSIS — R0602 Shortness of breath: Secondary | ICD-10-CM | POA: Diagnosis not present

## 2023-08-20 DIAGNOSIS — Z1231 Encounter for screening mammogram for malignant neoplasm of breast: Secondary | ICD-10-CM

## 2023-08-20 MED ORDER — STIOLTO RESPIMAT 2.5-2.5 MCG/ACT IN AERS
2.0000 | INHALATION_SPRAY | Freq: Every day | RESPIRATORY_TRACT | 5 refills | Status: AC
  Filled 2023-08-20 – 2023-08-22 (×5): qty 4, 30d supply, fill #0
  Filled 2023-09-20: qty 4, 30d supply, fill #1
  Filled 2023-10-25: qty 4, 30d supply, fill #2
  Filled 2023-11-21: qty 4, 30d supply, fill #3
  Filled 2023-12-21: qty 4, 30d supply, fill #4
  Filled 2024-01-23 – 2024-02-04 (×2): qty 4, 30d supply, fill #5

## 2023-08-20 MED ORDER — ALBUTEROL SULFATE HFA 108 (90 BASE) MCG/ACT IN AERS
2.0000 | INHALATION_SPRAY | Freq: Four times a day (QID) | RESPIRATORY_TRACT | 6 refills | Status: AC | PRN
  Filled 2023-08-20: qty 6.7, 25d supply, fill #0
  Filled 2023-12-16: qty 6.7, 25d supply, fill #1

## 2023-08-20 NOTE — Patient Instructions (Signed)
 I will see you back in about 6 months  Continue Stiolto Will send in a prescription for albuterol  to be used as needed  Continue regular exercises  Call us  with significant concerns

## 2023-08-20 NOTE — Progress Notes (Signed)
 Rose Miller    [id number]    02/14/1955  Primary Care Physician:Sebastian Beverley NOVAK, MD  Referring Physician: Sebastian Beverley NOVAK, MD [address] [place],  [place] [address]  Chief complaint:   Patient being evaluated for shortness of breath  HPI:  She feels overall better Tries to stay active Has been using Stiolto on a regular basis  No wheezing, no chest discomfort at rest  She did participate in rehab and plans to continue going to the [facility] on a regular basis  She quit smoking in 2013, 30-pack-year smoking history  She does have shortness of breath with a lot of activities History of hypertension, diabetes, hypercholesterolemia, chronic kidney disease  History of irregular heartbeats, headaches  Outpatient Encounter Medications as of 08/20/2023  Medication Sig   amLODipine  (NORVASC ) 10 MG tablet Take 1 tablet (10 mg total) by mouth daily.   amoxicillin  (AMOXIL ) 500 MG capsule take 4 capsules one hour prior to dental procedures and cleanings   apixaban  (ELIQUIS ) 5 MG TABS tablet Take 1 tablet (5 mg total) by mouth 2 (two) times daily.   Ascorbic Acid (VITAMIN C) 1000 MG tablet Take 1,000 mg by mouth daily.   atorvastatin  (LIPITOR) 10 MG tablet Take 1 tablet (10 mg total) by mouth daily.   azelastine  (ASTELIN ) 0.1 % nasal spray Place 2 sprays into both nostrils 2 (two) times daily. Use in each nostril as directed   Blood Glucose Monitoring Suppl (ONE TOUCH ULTRA 2) w/Device KIT Use as directed in the morning, at noon, and at bedtime.   Blood Glucose Monitoring Suppl DEVI Use as directed.   BOOSTRIX 5-2.5-18.5 LF-MCG/0.5 injection    Cholecalciferol 50 MCG (2000 UT) TABS Take 2,000 Units by mouth daily.   Coenzyme Q10 (CO Q-10) 100 MG CAPS Take 100 mg by mouth daily.    Cyanocobalamin (B-12) 500 MCG TABS Take 500 mcg by mouth daily.   diltiazem  (CARDIZEM ) 30 MG tablet Take 1 tablet (30 mg total) by mouth every 6 (six) hours as needed (fast heart  rate, heart palpitations).   empagliflozin  (JARDIANCE ) 10 MG TABS tablet Take 1 tablet (10 mg total) by mouth daily before breakfast.   fluticasone  (FLONASE ) 50 MCG/ACT nasal spray Place 2 sprays into both nostrils daily.   furosemide  (LASIX ) 20 MG tablet Take 1 tablet (20 mg total) by mouth daily as needed (for lower extremity swelling).   glucose blood (ONETOUCH ULTRA TEST) test strip Use to check blood sugar up to three times daily   hydrALAZINE  (APRESOLINE ) 50 MG tablet Take 1 tablet (50 mg total) by mouth 3 (three) times daily.   ipratropium (ATROVENT ) 0.06 % nasal spray Place 2 sprays into both nostrils 4 (four) times daily.   losartan  (COZAAR ) 100 MG tablet Take 1 tablet (100 mg total) by mouth daily. Discontinue losartan /hctz   Menthol, Topical Analgesic, (BLUE-EMU MAXIMUM STRENGTH) 2.5 % LIQD Apply 1 application topically daily as needed (pain).   metoprolol  succinate (TOPROL -XL) 25 MG 24 hr tablet Take 1 tablet (25 mg total) by mouth daily.   Olopatadine  HCl 0.2 % SOLN Apply 1 drop to eye in the morning and at bedtime.   omeprazole  (PRILOSEC ) 20 MG capsule Take 1 capsule (20 mg total) by mouth daily.   Semaglutide , 1 MG/DOSE, (OZEMPIC , 1 MG/DOSE,) 4 MG/3ML SOPN Inject 1 mg into the skin once a week.   spironolactone  (ALDACTONE ) 25 MG tablet Take 1 tablet (25 mg total) by mouth daily.  Tiotropium Bromide-Olodaterol (STIOLTO RESPIMAT ) 2.5-2.5 MCG/ACT AERS INHALE 2 PUFFS BY MOUTH ONCE DAILY   Zinc 50 MG TABS Take 50 mg by mouth daily.   No facility-administered encounter medications on file as of 08/20/2023.    Allergies as of 08/20/2023   (No Known Allergies)    Past Medical History:  Diagnosis Date   Adhesive capsulitis    Aortic valve disease    Arthritis    Asthma    Back pain    CKD (chronic kidney disease) stage 3, GFR 30-59 ml/min (HCC)    Constipation    COPD (chronic obstructive pulmonary disease) (HCC)    Diabetes mellitus without complication (HCC)     Diuretic-induced hypokalemia    Hypercholesterolemia    Hypertension    IT band syndrome    Morbid obesity (HCC)    Palpitations    Periumbilical pain    Postmenopausal estrogen deficiency    Prediabetes    S/P TAVR (transcatheter aortic valve replacement) 12/26/2020   Ventral hernia     Past Surgical History:  Procedure Laterality Date   BIOPSY  05/01/2021   Procedure: BIOPSY;  Surgeon: Charlanne Groom, MD;  Location: [facility];  Service: Gastroenterology;;   CESAREAN SECTION  1989   COLONOSCOPY     COLONOSCOPY WITH PROPOFOL  N/A 05/01/2021   Procedure: COLONOSCOPY WITH PROPOFOL ;  Surgeon: Charlanne Groom, MD;  Location: [facility];  Service: Gastroenterology;  Laterality: N/A;   ESOPHAGOGASTRODUODENOSCOPY (EGD) WITH PROPOFOL  N/A 05/01/2021   Procedure: ESOPHAGOGASTRODUODENOSCOPY (EGD) WITH PROPOFOL ;  Surgeon: Charlanne Groom, MD;  Location: [facility];  Service: Gastroenterology;  Laterality: N/A;   INTRAOPERATIVE TRANSTHORACIC ECHOCARDIOGRAM N/A 12/26/2020   Procedure: INTRAOPERATIVE TRANSTHORACIC ECHOCARDIOGRAM;  Surgeon: Wendel Lurena POUR, MD;  Location: [facility];  Service: Open Heart Surgery;  Laterality: N/A;   MALONEY DILATION  05/01/2021   Procedure: AGAPITO DILATION;  Surgeon: Charlanne Groom, MD;  Location: [facility];  Service: Gastroenterology;;   POLYPECTOMY  05/01/2021   Procedure: POLYPECTOMY;  Surgeon: Charlanne Groom, MD;  Location: [facility];  Service: Gastroenterology;;   RIGHT/LEFT HEART CATH AND CORONARY ANGIOGRAPHY N/A 11/24/2020   Procedure: RIGHT/LEFT HEART CATH AND CORONARY ANGIOGRAPHY;  Surgeon: Claudene Victory ORN, MD;  [facility];  Service: Cardiovascular;  Laterality: N/A;   TONSILLECTOMY AND ADENOIDECTOMY  1960   TRANSCATHETER AORTIC VALVE REPLACEMENT, TRANSFEMORAL N/A 12/26/2020   Procedure: TRANSCATHETER AORTIC VALVE REPLACEMENT, TRANSFEMORAL;  Surgeon: Wendel Lurena POUR, MD;  Location: [facility];  Service: Open Heart  Surgery;  Laterality: N/A;    Family History  Problem Relation Age of Onset   Dementia Mother    Arthritis Mother    Cancer Mother        pancreatic   Hypertension Mother    Obesity Mother    Cancer Father        prostate and colon   Colon cancer Father    Heart disease Father    Thyroid  disease Father    Breast cancer Neg Hx     Social History   Socioeconomic History   Marital status: Single    Spouse name: Not on file   Number of children: 1   Years of education: Not on file   Highest education level: Not on file  Occupational History   Occupation: retired  Tobacco Use   Smoking status: Former    Current packs/day: 0.00    Average packs/day: 1 pack/day for 38.0 years (38.0 ttl pk-yrs)    Types: Cigarettes  Start date: 77    Quit date: 2013    Years since quitting: 12.6    Passive exposure: Never   Smokeless tobacco: Never  Vaping Use   Vaping status: Never Used  Substance and Sexual Activity   Alcohol use: Yes    Comment: socially   Drug use: No   Sexual activity: Not Currently  Other Topics Concern   Not on file  Social History Narrative   Not on file   Social Drivers of Health   Financial Resource Strain: Low Risk  (03/28/2023)   Overall Financial Resource Strain (CARDIA)    Difficulty of Paying Living Expenses: Not hard at all  Food Insecurity: No Food Insecurity (07/24/2023)   Hunger Vital Sign    Worried About Running Out of Food in the Last Year: Never true    Ran Out of Food in the Last Year: Never true  Transportation Needs: No Transportation Needs (07/24/2023)   PRAPARE - Administrator, Civil Service (Medical): No    Lack of Transportation (Non-Medical): No  Physical Activity: Inactive (03/28/2023)   Exercise Vital Sign    Days of Exercise per Week: 0 days    Minutes of Exercise per Session: 0 min  Stress: No Stress Concern Present (03/28/2023)   [employer] of Occupational Health - Occupational Stress Questionnaire     Feeling of Stress : Not at all  Social Connections: Socially Isolated (03/28/2023)   Social Connection and Isolation Panel    Frequency of Communication with Friends and Family: More than three times a week    Frequency of Social Gatherings with Friends and Family: Patient declined    Attends Religious Services: Never    Database administrator or Organizations: No    Attends Banker Meetings: Never    Marital Status: Never married  Intimate Partner Violence: Not At Risk (07/24/2023)   Humiliation, Afraid, Rape, and Kick questionnaire    Fear of Current or Ex-Partner: No    Emotionally Abused: No    Physically Abused: No    Sexually Abused: No    Review of Systems  Respiratory:  Positive for shortness of breath. Negative for chest tightness.   Cardiovascular:  Negative for chest pain.    Vitals:   08/20/23 1052  BP: 126/75  Pulse: 89  SpO2: 97%   Physical Exam Constitutional:      Appearance: She is obese.  HENT:     Head: Normocephalic.     Nose: Nose normal.     Mouth/Throat:     Mouth: Mucous membranes are moist.  Eyes:     General: No scleral icterus. Cardiovascular:     Rate and Rhythm: Normal rate and regular rhythm.     Heart sounds: No murmur heard. Pulmonary:     Effort: No respiratory distress.     Breath sounds: No stridor. No wheezing or rhonchi.  Musculoskeletal:     Cervical back: No rigidity or tenderness.  Neurological:     Mental Status: She is alert.  Psychiatric:        Mood and Affect: Mood normal.    Data Reviewed: Echocardiogram 12/27/2022 with diastolic dysfunction  CT chest 07/17/2022 reviewed by myself showing evidence of emphysema  Spirometry 10/09/2021-no significant obstructive disease-reviewed by myself  PFT was reviewed with the patient showing no significant obstruction, no significant bronchodilator response, no restriction, normal diffusing capacity   Assessment:  Multifactorial shortness of breath,  deconditioning - Has been trying to stay  active - Did participate in rehab and feels it helped - Plans to continue going to the [facility]  CT scan did show emphysema, PFT with preservation of lung function 30-pack-year smoking history in the past  History of TAVR surgery in 2022  Plan/Recommendations:  Continue Stiolto  Continue graded activity as tolerated  Regular exercises as tolerated  Encourage weight loss efforts  Follow-up in about 6 months   Jennet Epley MD [facility] 08/20/2023, 10:58 AM  CC: Sebastian Beverley NOVAK, MD

## 2023-08-21 ENCOUNTER — Other Ambulatory Visit (HOSPITAL_COMMUNITY): Payer: Self-pay

## 2023-08-21 ENCOUNTER — Other Ambulatory Visit: Payer: Self-pay

## 2023-08-21 NOTE — Progress Notes (Signed)
   08/21/2023  Patient ID: Rose Miller, female   DOB: [birth year], 68 y.o.   MRN: [id number]  Subjective/Objective: Telephone visit to follow-up on management of chronic conditions  Diabetes -Current medications:  Ozempic  1mg  weekly, Jardiance  10mg  daily -Currently using Ozempic  1mg  weekly until 2mg  pens arrive from [facility]- delivery is supposed to arrive end of this week or first of next week -Patient has 1 dose left on hand of Ozempic  1mg , which she will take next Tuesday -Receives Jardiance  through [facility] and Ozempic  through [facility] -Patient does monitor home FBG daily, and values are averaging 105 -Recent A1c of 5.9% -Does not endorse s/sx of hypoglycemia or hyperglycemia   Hypertension -Current medications:  losartan  100mg  daily, metoprolol  xl 25mg  daily, spironolactone  25mg  daily, hydralazine  50mg  TID, furosemide  20mg  daily as needed (rarely), diltiazem  30mg  q6h prn (rarely), amlodipine  10mg  daily -Patient monitors home BP regularly, and values average 120/65 -Does not endorse any s/sx of hypotension or hypertension   Hyperlipidemia -Current medications:  atorvastatin  10mg  daily -Lipid panel 12/16/2022 WNL   Assessment/Plan:   Diabetes -Currently controlled -Continue current regimen at this time; increase Ozempic  to 2mg  weekly once this dose arrives at PCP office- patient will contact me if these do not arrive by beginning of next week -Of note, office should still have four 1mg  pens that were sent recently that patient did not pick up -Continue to monitor FBG daily   Hypertension -Currently controlled -Continue current regimen and regular monitoring of BP   Hyperlipidemia -Currently controlled -Continue current regimen and regular follow-up with PCP    Follow-up: 3 months   Channing DELENA Mealing, PharmD, DPLA

## 2023-08-22 ENCOUNTER — Other Ambulatory Visit (HOSPITAL_COMMUNITY): Payer: Self-pay

## 2023-08-22 ENCOUNTER — Other Ambulatory Visit: Payer: Self-pay

## 2023-08-25 ENCOUNTER — Other Ambulatory Visit: Payer: Self-pay

## 2023-08-25 ENCOUNTER — Other Ambulatory Visit (HOSPITAL_COMMUNITY): Payer: Self-pay

## 2023-08-25 ENCOUNTER — Telehealth

## 2023-08-25 ENCOUNTER — Telehealth: Payer: Self-pay | Admitting: *Deleted

## 2023-08-25 NOTE — Patient Outreach (Signed)
 Complex Care Management   Visit Note  08/25/2023  Name:  Rose Miller MRN: [id number] DOB: 10/11/1955  Situation: Referral received for Complex Care Management related to Diabetes with Complications, Atrial Fibrillation, and Hypertension I obtained verbal consent from Patient.  Visit completed with patient   on the phone  Background:   Past Medical History:  Diagnosis Date   Adhesive capsulitis    Aortic valve disease    Arthritis    Asthma    Back pain    CKD (chronic kidney disease) stage 3, GFR 30-59 ml/min (HCC)    Constipation    COPD (chronic obstructive pulmonary disease) (HCC)    Diabetes mellitus without complication (HCC)    Diuretic-induced hypokalemia    Hypercholesterolemia    Hypertension    IT band syndrome    Morbid obesity (HCC)    Palpitations    Periumbilical pain    Postmenopausal estrogen deficiency    Prediabetes    S/P TAVR (transcatheter aortic valve replacement) 12/26/2020   Ventral hernia     Assessment: Patient Reported Symptoms:  Cognitive Cognitive Status: Alert and oriented to person, place, and time   Health Maintenance Behaviors: None  Neurological Neurological Review of Symptoms: No symptoms reported Neurological Management Strategies: Coping strategies Neurological Self-Management Outcome: 4 (good)  HEENT HEENT Symptoms Reported: Tinnitus (Has  a little ringing in ears and has gone to ENT  States it's nothing) HEENT Management Strategies: Routine screening HEENT Self-Management Outcome: 4 (good)    Cardiovascular Cardiovascular Symptoms Reported: No symptoms reported, Swelling in legs or feet (R ankle swells pretty much everyday  Medicine) Does patient have uncontrolled Hypertension?: Yes Is patient checking Blood Pressure at home?: Yes Patient's Recent BP reading at home: 116/61 Today, 8-10-16-23 111/67 Cardiovascular Management Strategies: Medication therapy, Routine screening, Weight management, Fluid modification, Diet  modification, [facility] Do You Have a Working Readable Scale?: Yes Weight: (!) 357 lb (161.9 kg) (taken July 28th) Cardiovascular Self-Management Outcome: 4 (good)  Respiratory Respiratory Symptoms Reported: Shortness of breath Additional Respiratory Details: related to COPD Respiratory Management Strategies: Routine screening Respiratory Self-Management Outcome: 4 (good)  Endocrine Endocrine Symptoms Reported: No symptoms reported Is patient diabetic?: Yes Is patient checking blood sugars at home?: Yes List most recent blood sugar readings, include date and time of day: 105 fasting today Endocrine Self-Management Outcome: 4 (good)  Gastrointestinal Gastrointestinal Symptoms Reported: Constipation Additional Gastrointestinal Details: occasional - uses Miralax as needed Gastrointestinal Management Strategies: Medication therapy, Coping strategies Gastrointestinal Self-Management Outcome: 4 (good)    Genitourinary Genitourinary Symptoms Reported: No symptoms reported Genitourinary Management Strategies: Coping strategies Genitourinary Self-Management Outcome: 4 (good)  Integumentary Integumentary Symptoms Reported: No symptoms reported Skin Management Strategies: Routine screening Skin Self-Management Outcome: 4 (good)  Musculoskeletal Musculoskelatal Symptoms Reviewed: Back pain, Joint pain Additional Musculoskeletal Details: still has knee pain has joined a exercise Musculoskeletal Management Strategies: Exercise, Weight management, Routine screening, Diet modification Musculoskeletal Self-Management Outcome: 4 (good) Falls in the past year?: No Number of falls in past year: 1 or less Was there an injury with Fall?: No Fall Risk Category Calculator: 0 Patient Fall Risk Level: Low Fall Risk Patient at Risk for Falls Due to: Impaired mobility Fall risk Follow up: Falls evaluation completed, Falls prevention discussed  Psychosocial Psychosocial Symptoms  Reported: No symptoms reported Behavioral Management Strategies: Activity, Coping strategies, Exercise Behavioral Health Self-Management Outcome: 4 (good) Major Change/Loss/Stressor/Fears (CP): Denies Quality of Family Relationships: helpful, supportive, involved Do you feel physically threatened by others?:  No      08/25/2023    3:31 PM  Depression screen PHQ 2/9  Decreased Interest 0  Down, Depressed, Hopeless 0  PHQ - 2 Score 0    Vitals:   08/25/23 1521  BP: 116/61    Medications Reviewed Today     Reviewed by Kay Hendricks MATSU, RN (Case Manager) on 08/25/23 at 1515  Med List Status: <None>   Medication Order Taking? Sig Documenting Provider Last Dose Status Informant  albuterol  (VENTOLIN  HFA) 108 (90 Base) MCG/ACT inhaler [id number] Yes Inhale 2 puffs into the lungs every 6 (six) hours as needed for wheezing or shortness of breath. Neda Jennet LABOR, MD  Active   amLODipine  (NORVASC ) 10 MG tablet [id number] Yes Take 1 tablet (10 mg total) by mouth daily. Sebastian Beverley NOVAK, MD  Active   amoxicillin  (AMOXIL ) 500 MG capsule [id number] Yes take 4 capsules one hour prior to dental procedures and cleanings Sebastian Beverley NOVAK, MD  Active            Med Note DANICE, LISA   Tue Jun 24, 2023  2:19 PM) Prescribed for dental exam in October 2025  apixaban  (ELIQUIS ) 5 MG TABS tablet [id number] Yes Take 1 tablet (5 mg total) by mouth 2 (two) times daily. Sebastian Beverley NOVAK, MD  Active   Ascorbic Acid (VITAMIN C) 1000 MG tablet [id number] Yes Take 1,000 mg by mouth daily. [provider]  Active Self  atorvastatin  (LIPITOR) 10 MG tablet [id number] Yes Take 1 tablet (10 mg total) by mouth daily. Sebastian Beverley NOVAK, MD  Active   azelastine  (ASTELIN ) 0.1 % nasal spray [id number] Yes Place 2 sprays into both nostrils 2 (two) times daily. Use in each nostril as directed Hedges, Reyes, PA-C  Active   Blood Glucose Monitoring Suppl (ONE TOUCH ULTRA 2) w/Device KIT [id number] Yes Use as directed in  the morning, at noon, and at bedtime. Sebastian Beverley NOVAK, MD  Active   Blood Glucose Monitoring Suppl DEVI [id number] Yes Use as directed. Sebastian Beverley NOVAK, MD  Active   BOOSTRIX 5-2.5-18.5 LF-MCG/0.5 injection [id number]    Patient not taking: Reported on 08/25/2023   [provider]  Active   Cholecalciferol 50 MCG (2000 UT) TABS [id number] Yes Take 2,000 Units by mouth daily. [provider]  Active Self  Coenzyme Q10 (CO Q-10) 100 MG CAPS [id number] Yes Take 100 mg by mouth daily.  [provider]  Active Self  Cyanocobalamin (B-12) 500 MCG TABS [id number] Yes Take 500 mcg by mouth daily. [provider]  Active Self  diltiazem  (CARDIZEM ) 30 MG tablet [id number] Yes Take 1 tablet (30 mg total) by mouth every 6 (six) hours as needed (fast heart rate, heart palpitations). Claudene Victory ORN, MD  Active            Med Note JACKOLYN, [place] R   Thu Apr 10, 2023 10:54 AM)    empagliflozin  (JARDIANCE ) 10 MG TABS tablet [id number] Yes Take 1 tablet (10 mg total) by mouth daily before breakfast. Berneta Elsie Sayre, MD  Active            Med Note JACKOLYN, [place] R   Thu Apr 10, 2023 10:54 AM)    fluticasone  (FLONASE ) 50 MCG/ACT nasal spray [id number] Yes Place 2 sprays into both nostrils daily. Webb, Padonda B, FNP  Active   furosemide  (LASIX ) 20 MG tablet [id number] Yes Take 1 tablet (20 mg total) by mouth daily as needed (for lower extremity swelling). Walker, Caitlin S,  NP  Active   glucose blood (ONETOUCH ULTRA TEST) test strip [id number] Yes Use to check blood sugar up to three times daily Sebastian Beverley NOVAK, MD  Active   hydrALAZINE  (APRESOLINE ) 50 MG tablet [id number] Yes Take 1 tablet (50 mg total) by mouth 3 (three) times daily. Raford Riggs, MD  Active   ipratropium (ATROVENT ) 0.06 % nasal spray [id number] Yes Place 2 sprays into both nostrils 4 (four) times daily. Lorin Louis Gaw, MD  Active   losartan  (COZAAR ) 100 MG tablet [id number] Yes Take 1 tablet (100 mg  total) by mouth daily. Discontinue losartan benson Raford Riggs, MD  Active   Menthol, Topical Analgesic, (BLUE-EMU MAXIMUM STRENGTH) 2.5 % LIQD [id number] Yes Apply 1 application topically daily as needed (pain). [provider]  Active Self  metoprolol  succinate (TOPROL -XL) 25 MG 24 hr tablet [id number] Yes Take 1 tablet (25 mg total) by mouth daily. Sebastian Beverley NOVAK, MD  Active   Olopatadine  HCl 0.2 % SOLN [id number] Yes Apply 1 drop to eye in the morning and at bedtime. Lorin Kaycen Whitworth, MD  Active   omeprazole  (PRILOSEC ) 20 MG capsule [id number] Yes Take 1 capsule (20 mg total) by mouth daily. Charlanne Groom, MD  Active   Semaglutide , 1 MG/DOSE, (OZEMPIC , 1 MG/DOSE,) 4 MG/3ML SOPN [id number] Yes Inject 1 mg into the skin once a week. [provider]  Active            Med Note ZENA, CHERYL A   Thu May 29, 2023  3:14 PM) PAP  spironolactone  (ALDACTONE ) 25 MG tablet [id number] Yes Take 1 tablet (25 mg total) by mouth daily. Raford Riggs, MD  Active   Tiotropium Bromide-Olodaterol (STIOLTO RESPIMAT ) 2.5-2.5 MCG/ACT AERS [id number] Yes Inhale 2 puffs into the lungs daily. Neda Jennet LABOR, MD  Active   Zinc 50 MG TABS [id number] Yes Take 50 mg by mouth daily. [provider]  Active Self            Recommendation:   Continue Current Plan of Care  Follow Up Plan:   Telephone follow-up in 1 month  Hendricks Her RN, BSN  [place] [facility] RN Case Information systems manager [phone]

## 2023-08-25 NOTE — Patient Instructions (Signed)
 Visit Information  Thank you for taking time to visit with me today. Please don't hesitate to contact me if I can be of assistance to you before our next scheduled appointment.  Your next care management appointment is by telephone on 09-26-2023 at 2:00 PM   Telephone follow-up in 1 month  Please call the care guide team at [phone] if you need to cancel, schedule, or reschedule an appointment.   Please call the Suicide and Crisis Lifeline: 988 call the [place]  National Suicide Prevention Lifeline: 901-852-2407 or TTY: (913) 438-8741 TTY 571-571-7424) to talk to a trained counselor call 1-800-273-TALK (toll free, 24 hour hotline) go to [facility] [address], [place] [phone]) call 911 if you are experiencing a Mental Health or Behavioral Health Crisis or need someone to talk to.  Hendricks Her RN, BSN  [place] [facility] RN Case Information systems manager [phone]

## 2023-08-26 NOTE — Telephone Encounter (Signed)
 Would there be concern for patient to start these?

## 2023-08-27 ENCOUNTER — Telehealth: Payer: Self-pay

## 2023-08-27 NOTE — Progress Notes (Signed)
   08/27/2023  Patient ID: Rose Miller, female   DOB: 11-Jul-1955, 68 y.o.   MRN: [id number]  Missed call/voicemail from patient stating she has not yet received the Ozempic  2mg  from [facility] expected arrive the end of last week.  She has been using 1mg  pens she had on hand and used her last dose yesterday.  Contacted [facility], and medication was actually delivered to the office this morning.  Verifying 4 pens of Ozempic  2mg  are at [facility] and will make patient aware.  Rose Miller, PharmD, DPLA

## 2023-08-27 NOTE — Progress Notes (Signed)
   08/27/2023  Patient ID: Rose Miller, female   DOB: March 10, 1955, 68 y.o.   MRN: [id number]  Verified 4 boxes of Ozempic  2mg  have arrived at Dr. Oswald office from [facility].  I was not able to reach the patient to inform but did leave a message (DPR allows detailed message to be left).  Follow-up already scheduled with patient for next month.  Channing DELENA Mealing, PharmD, DPLA

## 2023-08-28 ENCOUNTER — Ambulatory Visit: Admitting: Family Medicine

## 2023-08-29 NOTE — Telephone Encounter (Signed)
 Spoke to patient and informed her that OZEMPIC  pens 2MG  (4 boxes) is ready for pick up. Pt verbalized understanding and stated she thought the office closes at 3pm today. I did inform patient that business hours are from 8am -5pm. Pt will be up either today or Monday

## 2023-08-29 NOTE — Telephone Encounter (Signed)
 Patient will pick up RX today or Monday.

## 2023-08-29 NOTE — Telephone Encounter (Signed)
 Pt picked up 4 boxes of 2mg  Ozempic 

## 2023-09-01 NOTE — Telephone Encounter (Signed)
 Reached out to patient regarding any OOP statements she may have from pharmacy payments- left HIPAA compliant v/m to return call.

## 2023-09-09 ENCOUNTER — Other Ambulatory Visit (HOSPITAL_COMMUNITY): Payer: Self-pay

## 2023-09-10 ENCOUNTER — Other Ambulatory Visit (HOSPITAL_COMMUNITY): Payer: Self-pay

## 2023-09-12 DIAGNOSIS — D6859 Other primary thrombophilia: Secondary | ICD-10-CM | POA: Diagnosis not present

## 2023-09-12 DIAGNOSIS — I10 Essential (primary) hypertension: Secondary | ICD-10-CM | POA: Diagnosis not present

## 2023-09-13 LAB — BASIC METABOLIC PANEL WITH GFR
BUN/Creatinine Ratio: 12 (ref 12–28)
BUN: 16 mg/dL (ref 8–27)
CO2: 21 mmol/L (ref 20–29)
Calcium: 9.5 mg/dL (ref 8.7–10.3)
Chloride: 106 mmol/L (ref 96–106)
Creatinine, Ser: 1.3 mg/dL — ABNORMAL HIGH (ref 0.57–1.00)
Glucose: 85 mg/dL (ref 70–99)
Potassium: 4.7 mmol/L (ref 3.5–5.2)
Sodium: 140 mmol/L (ref 134–144)
eGFR: 45 mL/min/1.73 — ABNORMAL LOW (ref >59)

## 2023-09-13 LAB — CBC
Hematocrit: 38.5 % (ref 34.0–46.6)
Hemoglobin: 11.6 g/dL (ref 11.1–15.9)
MCH: 25.4 pg — ABNORMAL LOW (ref 26.6–33.0)
MCHC: 30.1 g/dL — ABNORMAL LOW (ref 31.5–35.7)
MCV: 84 fL (ref 79–97)
Platelets: 212 x10E3/uL (ref 150–450)
RBC: 4.56 x10E6/uL (ref 3.77–5.28)
RDW: 17.6 % — ABNORMAL HIGH (ref 11.7–15.4)
WBC: 5.4 x10E3/uL (ref 3.4–10.8)

## 2023-09-16 ENCOUNTER — Telehealth: Payer: Self-pay | Admitting: Family

## 2023-09-16 NOTE — Telephone Encounter (Signed)
 Vannie Reche RAMAN, NP to Me (Selected Message)    09/16/23  7:38 AM Result Note CBC with improving anemia. Kidney function returned to her baseline. Good result!    The patient has been notified of the result and verbalized understanding.  All questions (if any) were answered. Rose Miller HERO, LPN [id number] 6:80 PM

## 2023-09-16 NOTE — Telephone Encounter (Signed)
 Pt calling in for lab results, she viewed through mychart but requested a c/b

## 2023-09-17 ENCOUNTER — Other Ambulatory Visit (HOSPITAL_COMMUNITY): Payer: Self-pay

## 2023-09-17 ENCOUNTER — Ambulatory Visit: Admitting: Family Medicine

## 2023-09-18 ENCOUNTER — Encounter: Payer: Self-pay | Admitting: Family Medicine

## 2023-09-18 ENCOUNTER — Ambulatory Visit (INDEPENDENT_AMBULATORY_CARE_PROVIDER_SITE_OTHER)

## 2023-09-18 ENCOUNTER — Ambulatory Visit: Admitting: Family Medicine

## 2023-09-18 ENCOUNTER — Telehealth: Payer: Self-pay | Admitting: Family Medicine

## 2023-09-18 ENCOUNTER — Other Ambulatory Visit (HOSPITAL_COMMUNITY): Payer: Self-pay

## 2023-09-18 VITALS — BP 130/70 | HR 78 | Temp 97.0°F | Resp 18 | Wt 352.2 lb

## 2023-09-18 DIAGNOSIS — E669 Obesity, unspecified: Secondary | ICD-10-CM

## 2023-09-18 DIAGNOSIS — M79675 Pain in left toe(s): Secondary | ICD-10-CM

## 2023-09-18 DIAGNOSIS — E1122 Type 2 diabetes mellitus with diabetic chronic kidney disease: Secondary | ICD-10-CM | POA: Diagnosis not present

## 2023-09-18 DIAGNOSIS — E1159 Type 2 diabetes mellitus with other circulatory complications: Secondary | ICD-10-CM | POA: Diagnosis not present

## 2023-09-18 DIAGNOSIS — D508 Other iron deficiency anemias: Secondary | ICD-10-CM | POA: Diagnosis not present

## 2023-09-18 DIAGNOSIS — I480 Paroxysmal atrial fibrillation: Secondary | ICD-10-CM | POA: Diagnosis not present

## 2023-09-18 DIAGNOSIS — J431 Panlobular emphysema: Secondary | ICD-10-CM

## 2023-09-18 DIAGNOSIS — Z6841 Body Mass Index (BMI) 40.0 and over, adult: Secondary | ICD-10-CM

## 2023-09-18 DIAGNOSIS — E66813 Obesity, class 3: Secondary | ICD-10-CM

## 2023-09-18 DIAGNOSIS — N183 Chronic kidney disease, stage 3 unspecified: Secondary | ICD-10-CM

## 2023-09-18 DIAGNOSIS — E1169 Type 2 diabetes mellitus with other specified complication: Secondary | ICD-10-CM

## 2023-09-18 DIAGNOSIS — I152 Hypertension secondary to endocrine disorders: Secondary | ICD-10-CM

## 2023-09-18 DIAGNOSIS — E559 Vitamin D deficiency, unspecified: Secondary | ICD-10-CM

## 2023-09-18 DIAGNOSIS — G8929 Other chronic pain: Secondary | ICD-10-CM

## 2023-09-18 DIAGNOSIS — E785 Hyperlipidemia, unspecified: Secondary | ICD-10-CM

## 2023-09-18 DIAGNOSIS — Z122 Encounter for screening for malignant neoplasm of respiratory organs: Secondary | ICD-10-CM

## 2023-09-18 LAB — CBC WITH DIFFERENTIAL/PLATELET
Basophils Absolute: 0 K/uL (ref 0.0–0.1)
Basophils Relative: 0.7 % (ref 0.0–3.0)
Eosinophils Absolute: 0.1 K/uL (ref 0.0–0.7)
Eosinophils Relative: 2.3 % (ref 0.0–5.0)
HCT: 39.2 % (ref 36.0–46.0)
Hemoglobin: 12.4 g/dL (ref 12.0–15.0)
Lymphocytes Relative: 42.8 % (ref 12.0–46.0)
Lymphs Abs: 2.3 K/uL (ref 0.7–4.0)
MCHC: 31.6 g/dL (ref 30.0–36.0)
MCV: 81.6 fl (ref 78.0–100.0)
Monocytes Absolute: 0.7 K/uL (ref 0.1–1.0)
Monocytes Relative: 12 % (ref 3.0–12.0)
Neutro Abs: 2.3 K/uL (ref 1.4–7.7)
Neutrophils Relative %: 42.2 % — ABNORMAL LOW (ref 43.0–77.0)
Platelets: 211 K/uL (ref 150.0–400.0)
RBC: 4.8 Mil/uL (ref 3.87–5.11)
RDW: 20.5 % — ABNORMAL HIGH (ref 11.5–15.5)
WBC: 5.5 K/uL (ref 4.0–10.5)

## 2023-09-18 LAB — COMPREHENSIVE METABOLIC PANEL WITH GFR
ALT: 18 U/L (ref 0–35)
AST: 19 U/L (ref 0–37)
Albumin: 4.4 g/dL (ref 3.5–5.2)
Alkaline Phosphatase: 80 U/L (ref 39–117)
BUN: 25 mg/dL — ABNORMAL HIGH (ref 6–23)
CO2: 26 meq/L (ref 19–32)
Calcium: 9.7 mg/dL (ref 8.4–10.5)
Chloride: 102 meq/L (ref 96–112)
Creatinine, Ser: 1.45 mg/dL — ABNORMAL HIGH (ref 0.40–1.20)
GFR: 36.75 mL/min — ABNORMAL LOW (ref >60.00)
Glucose, Bld: 93 mg/dL (ref 70–99)
Potassium: 4.1 meq/L (ref 3.5–5.1)
Sodium: 140 meq/L (ref 135–145)
Total Bilirubin: 0.5 mg/dL (ref 0.2–1.2)
Total Protein: 8.2 g/dL (ref 6.0–8.3)

## 2023-09-18 LAB — MICROALBUMIN / CREATININE URINE RATIO
Creatinine,U: 239 mg/dL
Microalb Creat Ratio: 5.6 mg/g (ref 0.0–30.0)
Microalb, Ur: 1.4 mg/dL (ref 0.0–1.9)

## 2023-09-18 LAB — LIPID PANEL
Cholesterol: 132 mg/dL (ref 0–200)
HDL: 54.2 mg/dL (ref >39.00)
LDL Cholesterol: 58 mg/dL (ref 0–99)
NonHDL: 77.61
Total CHOL/HDL Ratio: 2
Triglycerides: 97 mg/dL (ref 0.0–149.0)
VLDL: 19.4 mg/dL (ref 0.0–40.0)

## 2023-09-18 LAB — TSH: TSH: 1.47 u[IU]/mL (ref 0.35–5.50)

## 2023-09-18 LAB — URIC ACID: Uric Acid, Serum: 5.4 mg/dL (ref 2.4–7.0)

## 2023-09-18 LAB — HEMOGLOBIN A1C: Hgb A1c MFr Bld: 6 % (ref 4.6–6.5)

## 2023-09-18 MED ORDER — SEMAGLUTIDE (2 MG/DOSE) 8 MG/3ML ~~LOC~~ SOPN
2.0000 mg | PEN_INJECTOR | SUBCUTANEOUS | 3 refills | Status: AC
  Filled 2023-09-18: qty 9, 84d supply, fill #0

## 2023-09-18 NOTE — Patient Instructions (Addendum)
  VISIT SUMMARY: During today's visit, we discussed your diabetes, hypertension, chronic kidney disease, obesity, hyperlipidemia, COPD, anemia, and right toe pain. Your diabetes is well-controlled, and you have successfully lost weight. We also reviewed your medications and planned further evaluations and follow-ups.  YOUR PLAN: -DIABETES MELLITUS TYPE 2: Your diabetes is well-controlled with a hemoglobin A1c of 5.5%. You have lost 13-14 pounds since increasing your Ozempic  dose to 2.2 mg weekly. Continue taking semaglutide  2 mg weekly and empagliflozin  10 mg daily. We will order a diabetic renal evaluation and refer you for a diabetic eye exam.  -HYPERTENSION: Your blood pressure is well-controlled at 130/70 mmHg with your current medications. Continue taking furosemide  20 mg as needed, hydralazine  50 mg three times daily, losartan  100 mg daily, metoprolol  succinate 25 mg daily, spironalactone 50 mg, and amlodipine  10 mg daily.  -CHRONIC KIDNEY DISEASE STAGE 3 (CKD3): Your chronic kidney disease is stable with no significant changes in kidney function. We will order a metabolic panel, and check your PTH, vitamin D , calcium , phosphorus levels, and a complete blood count (CBC) to monitor your kidney function.  -OBESITY: You have lost 13-14 pounds with the help of semaglutide  and increased physical activity. Continue your current weight loss efforts and physical activity.  -HYPERLIPIDEMIA: You are taking atorvastatin  for high cholesterol. We will order a fasting lipid panel to re-evaluate your cholesterol levels.  -CHRONIC OBSTRUCTIVE PULMONARY DISEASE (COPD): You have COPD with emphysema, and you use Stiolto and albuterol  inhalers. Your shortness of breath has improved with inhaler use. We will order a lung cancer screening due to your smoking history.  -ANEMIA: Your anemia has improved with iron supplements, but you experience constipation as a side effect, managed with Miralax. We will check your  iron levels and ferritin.  -RIGHT TOE PAIN: You have had right toe pain for about three weeks, which is tender when touched. We will order an x-ray of your right toe and check your uric acid levels to determine the cause.  -GENERAL HEALTH MAINTENANCE: You are due for lung cancer screening due to your smoking history. You have an eye exam scheduled in October and regular follow-ups with your foot doctor. We will also refer you for physical therapy for your musculoskeletal issues.  INSTRUCTIONS: Please schedule a follow-up appointment in six months unless lab work indicates otherwise. We will call you with your lab results. Ensure you attend your eye exam in October and follow up with your foot doctor as scheduled. Additionally, we will refer you for physical therapy.                      Contains text generated by [employer].                                 Contains text generated by [employer].

## 2023-09-18 NOTE — Telephone Encounter (Signed)
  Pt indicated there is no reason for this request. This manager is reviewing pt chart.

## 2023-09-18 NOTE — Progress Notes (Signed)
 Assessment  Assessment/Plan:  Assessment and Plan Assessment & Plan Diabetes Mellitus Type 2 Diabetes is well-controlled with a hemoglobin A1c of 5.5%. She has lost 13-14 pounds since increasing semaglutide  (Ozempic ) to 2.2 mg weekly. No significant gastrointestinal side effects reported from semaglutide , though occasional mild stomach discomfort is noted. She is due for a diabetic renal evaluation and a diabetic eye exam. - Continue semaglutide  2 mg injection weekly - Continue empagliflozin  10 mg daily - Order diabetic renal evaluation with urine albumin-creatinine ratio - Refer for diabetic eye exam  Hypertension Hypertension is well-controlled with a blood pressure of 130/70 mmHg. She is on multiple antihypertensive medications. - Continue furosemide  20 mg daily as needed - Continue hydralazine  50 mg TID - Continue losartan  100 mg daily - Continue metoprolol  succinate 25 mg daily - Continue amlodipine  10 mg daily - continue spironolactone  50 mg daily   Chronic Kidney Disease Stage 3 (CKD3) CKD3 is well-managed with no significant changes in kidney function. Recent lab work showed stable kidney function and electrolytes. Further evaluation is planned to assess kidney function and related parameters. - Order metabolic panel - Order PTH, vitamin D , calcium , and phosphorus levels - Order CBC to rule out renal dysfunction anemia  Obesity She has lost 13-14 pounds with the aid of semaglutide . She is actively working on weight loss and reports feeling better with increased physical activity. - Continue current weight loss efforts and physical activity  Hyperlipidemia She is on atorvastatin  10 mg for hyperlipidemia. A fasting lipid panel is due for re-evaluation. - Continue atorvastatin  - Order fasting lipid panel  Chronic Obstructive Pulmonary Disease (COPD) She has COPD with emphysema changes noted on previous lung cancer screening. She uses Stiolto and albuterol  inhalers and  reports occasional shortness of breath, which has improved with inhaler use. She is due for lung cancer screening due to smoking history. - Order lung cancer screening due to smoking history  Anemia Anemia has improved with iron supplementation. Recent blood counts improved from 10.8 to 11.6. She reports constipation as a side effect of iron supplementation, managed with Miralax. Further evaluation of iron levels is planned. - Check iron levels and ferritin  Left Toe Pain She reports right toe pain for about three weeks, with tenderness upon palpation. Differential diagnosis includes gout or arthritis, though she does not believe it is gout. No visible signs of injury or skin breakdown. An x-ray and uric acid level check are planned to further evaluate the cause. - Order x-ray of the left toe - Check uric acid levels  General Health Maintenance She is due for lung cancer screening due to a history of smoking. She is scheduled for an eye exam in October and follows up with a foot doctor every nine weeks. She is also referred for physical therapy for musculoskeletal issues. - Order lung cancer screening - Ensure follow-up with eye doctor in October - Ensure follow-up with podiatry doctor - Provide referral for physical therapy           Subjective:   Encounter date: 09/18/2023  Chief Complaint  Patient presents with   Diabetes    3 month follow up. Pt is fasting today; last A1c done 06/17/2023 5.9  HM due - diabetic eye exam and ( patient declined flu vaccine)    Toe Pain    Pt c/o of right big toe discomfort for a few weeks.     Discussed the use of AI scribe software for clinical note transcription with the patient, who  gave verbal consent to proceed.  History of Present Illness Rose Miller is a 68 year old female with diabetes, obesity, and hypertension who presents for diabetic follow-up and obesity management.  She has lost 13-14 pounds since increasing her  Ozempic  dose to 2.2 mg weekly. Her hemoglobin A1c is well controlled at 5.5. She is currently taking Ozempic , empagliflozin  10 mg for diabetes, and atorvastatin  for hyperlipidemia. She also uses furosemide  20 mg PRN, hydralazine  50 mg TID, losartan  100 mg, metoprolol  succinate 25 mg daily, and amlodipine  10 mg daily for hypertension.  She reports right toe pain for the past three weeks, describing it as tender when touched and feeling like 'something's in it.' No history of gout, and the pain is localized to a specific area on the toe. She has an upcoming appointment with her foot doctor.  Her last lab work on August 29 showed stable kidney function and electrolytes. She takes iron supplements for anemia, which has improved from a hemoglobin of 10.8 to 11.6. She experiences constipation as a side effect of the iron, managed with Miralax.  She has a history of smoking and underwent lung cancer screening last year, which showed no masses but did reveal emphysema. She uses Stiolto and albuterol  for COPD, which helps with occasional shortness of breath.  She mentions ongoing issues with her back, knees, and hips, which have been improving with increased physical activity. She has not undergone physical therapy but is interested in it. She uses Voltaren, Blue Emu, and green alcohol for pain management.  She has not yet had her diabetic eye exam but plans to see an eye doctor in October.       09/18/2023   11:13 AM 08/25/2023    3:31 PM 07/08/2023    2:09 PM 06/17/2023   11:02 AM 05/28/2023   10:50 AM  Depression screen PHQ 2/9  Decreased Interest 0 0 0 0 0  Down, Depressed, Hopeless 0 0 0 0 0  PHQ - 2 Score 0 0 0 0 0       03/14/2022   11:03 AM  GAD 7 : Generalized Anxiety Score  Nervous, Anxious, on Edge 0  Control/stop worrying 0  Worry too much - different things 0  Trouble relaxing 0  Restless 0  Easily annoyed or irritable 0  Afraid - awful might happen 0  Total GAD 7 Score 0  Anxiety  Difficulty Not difficult at all    Health Maintenance Due  Topic Date Due   Diabetic kidney evaluation - Urine ACR  Never done   OPHTHALMOLOGY EXAM  05/21/2023   Lung Cancer Screening  07/17/2023      PMH:  The following were reviewed and entered/updated in epic: Past Medical History:  Diagnosis Date   Adhesive capsulitis    Aortic valve disease    Arthritis    Asthma    Back pain    CKD (chronic kidney disease) stage 3, GFR 30-59 ml/min (HCC)    Constipation    COPD (chronic obstructive pulmonary disease) (HCC)    Diabetes mellitus without complication (HCC) 10 20 2020   Diuretic-induced hypokalemia    Heart murmur    Hypercholesterolemia    Hypertension    IT band syndrome    Morbid obesity (HCC)    Palpitations    Periumbilical pain    Postmenopausal estrogen deficiency    Prediabetes    S/P TAVR (transcatheter aortic valve replacement) 12/26/2020   Ventral hernia     Patient  Active Problem List   Diagnosis Date Noted   Kidney lesion 04/14/2023   Abscess of lower back 03/17/2023   Ingrown nail of great toe of left foot 12/16/2022   Vitamin D  deficiency 12/16/2022   Low back pain 12/16/2022   Chronic pain of both knees 12/16/2022   Dyspnea on exertion 12/16/2022   Coronary artery disease due to calcified coronary lesion 12/16/2022   Aortic atherosclerosis (HCC) 12/16/2022   PAF (paroxysmal atrial fibrillation) (HCC) 03/14/2022   Otalgia of left ear 03/14/2022   Diplopia 09/12/2021   Allergic rhinitis 09/12/2021   Pharyngoesophageal dysphagia    Family history of colon cancer in father    Aortic valve disease 12/26/2020   COPD (chronic obstructive pulmonary disease) (HCC) 12/26/2020   Hypertension associated with diabetes (HCC) 04/24/2020   Postmenopausal estrogen deficiency 02/18/2019   Former smoker 05/13/2018   Long-term use of aspirin  therapy 05/13/2018   Type 2 diabetes mellitus with obesity (HCC) 03/31/2017   CKD stage 3 secondary to diabetes  (HCC) 03/31/2017   Hyperlipidemia LDL goal <70 06/28/2016   Periumbilical pain 03/29/2016   IT band syndrome 09/11/2014   Right shoulder pain 09/11/2014   Ventral hernia 05/01/2014   Class 3 severe obesity due to excess calories with body mass index (BMI) of 50.0 to 59.9 in adult 03/17/2014    Past Surgical History:  Procedure Laterality Date   BIOPSY  05/01/2021   Procedure: BIOPSY;  Surgeon: Charlanne Groom, MD;  Location: [facility];  Service: Gastroenterology;;   CARDIAC CATHETERIZATION  11/24/2020   CESAREAN SECTION  1989   COLONOSCOPY     COLONOSCOPY WITH PROPOFOL  N/A 05/01/2021   Procedure: COLONOSCOPY WITH PROPOFOL ;  Surgeon: Charlanne Groom, MD;  Location: [facility];  Service: Gastroenterology;  Laterality: N/A;   ESOPHAGOGASTRODUODENOSCOPY (EGD) WITH PROPOFOL  N/A 05/01/2021   Procedure: ESOPHAGOGASTRODUODENOSCOPY (EGD) WITH PROPOFOL ;  Surgeon: Charlanne Groom, MD;  Location: [facility];  Service: Gastroenterology;  Laterality: N/A;   INTRAOPERATIVE TRANSTHORACIC ECHOCARDIOGRAM N/A 12/26/2020   Procedure: INTRAOPERATIVE TRANSTHORACIC ECHOCARDIOGRAM;  Surgeon: Wendel Lurena POUR, MD;  Location: [facility];  Service: Open Heart Surgery;  Laterality: N/A;   MALONEY DILATION  05/01/2021   Procedure: AGAPITO DILATION;  Surgeon: Charlanne Groom, MD;  Location: [facility];  Service: Gastroenterology;;   POLYPECTOMY  05/01/2021   Procedure: POLYPECTOMY;  Surgeon: Charlanne Groom, MD;  Location: [facility];  Service: Gastroenterology;;   RIGHT/LEFT HEART CATH AND CORONARY ANGIOGRAPHY N/A 11/24/2020   Procedure: RIGHT/LEFT HEART CATH AND CORONARY ANGIOGRAPHY;  Surgeon: Claudene Victory ORN, MD;  Location: [facility];  Service: Cardiovascular;  Laterality: N/A;   TONSILLECTOMY AND ADENOIDECTOMY  1960   TRANSCATHETER AORTIC VALVE REPLACEMENT, TRANSFEMORAL N/A 12/26/2020   Procedure: TRANSCATHETER AORTIC VALVE REPLACEMENT, TRANSFEMORAL;  Surgeon: Wendel Lurena POUR, MD;  Location: [facility];  Service: Open Heart Surgery;  Laterality: N/A;    Family History  Problem Relation Age of Onset   Dementia Mother    Arthritis Mother    Cancer Mother        pancreatic   Hypertension Mother    Obesity Mother    Cancer Father        prostate and colon   Colon cancer Father    Heart disease Father    Thyroid  disease Father    Breast cancer Neg Hx     Medications- reviewed and updated Outpatient Medications Prior to Visit  Medication Sig Dispense Refill   ferrous sulfate 325 (65 FE) MG tablet  albuterol  (VENTOLIN  HFA) 108 (90 Base) MCG/ACT inhaler Inhale 2 puffs into the lungs every 6 (six) hours as needed for wheezing or shortness of breath. 6.7 g 6   amLODipine  (NORVASC ) 10 MG tablet Take 1 tablet (10 mg total) by mouth daily. 90 tablet 3   amoxicillin  (AMOXIL ) 500 MG capsule take 4 capsules one hour prior to dental procedures and cleanings 12 capsule 6   apixaban  (ELIQUIS ) 5 MG TABS tablet Take 1 tablet (5 mg total) by mouth 2 (two) times daily. 180 tablet 1   Ascorbic Acid (VITAMIN C) 1000 MG tablet Take 1,000 mg by mouth daily.     atorvastatin  (LIPITOR) 10 MG tablet Take 1 tablet (10 mg total) by mouth daily. 90 tablet 3   azelastine  (ASTELIN ) 0.1 % nasal spray Place 2 sprays into both nostrils 2 (two) times daily. Use in each nostril as directed 30 mL 12   Blood Glucose Monitoring Suppl (ONE TOUCH ULTRA 2) w/Device KIT Use as directed in the morning, at noon, and at bedtime. 1 kit 0   Blood Glucose Monitoring Suppl DEVI Use as directed. 1 each 0   BOOSTRIX 5-2.5-18.5 LF-MCG/0.5 injection  (Patient not taking: Reported on 08/25/2023)     Cholecalciferol 50 MCG (2000 UT) TABS Take 2,000 Units by mouth daily.     Coenzyme Q10 (CO Q-10) 100 MG CAPS Take 100 mg by mouth daily.      Cyanocobalamin (B-12) 500 MCG TABS Take 500 mcg by mouth daily.     diltiazem  (CARDIZEM ) 30 MG tablet Take 1 tablet (30 mg total) by mouth every 6 (six) hours as needed (fast heart  rate, heart palpitations). 30 tablet 6   empagliflozin  (JARDIANCE ) 10 MG TABS tablet Take 1 tablet (10 mg total) by mouth daily before breakfast. 90 tablet 0   fluticasone  (FLONASE ) 50 MCG/ACT nasal spray Place 2 sprays into both nostrils daily. 16 g 6   furosemide  (LASIX ) 20 MG tablet Take 1 tablet (20 mg total) by mouth daily as needed (for lower extremity swelling). 30 tablet 4   glucose blood (ONETOUCH ULTRA TEST) test strip Use to check blood sugar up to three times daily 100 each 12   hydrALAZINE  (APRESOLINE ) 50 MG tablet Take 1 tablet (50 mg total) by mouth 3 (three) times daily. 270 tablet 2   ipratropium (ATROVENT ) 0.06 % nasal spray Place 2 sprays into both nostrils 4 (four) times daily. 15 mL 12   losartan  (COZAAR ) 100 MG tablet Take 1 tablet (100 mg total) by mouth daily. Discontinue losartan /hctz 90 tablet 3   Menthol, Topical Analgesic, (BLUE-EMU MAXIMUM STRENGTH) 2.5 % LIQD Apply 1 application topically daily as needed (pain).     metoprolol  succinate (TOPROL -XL) 25 MG 24 hr tablet Take 1 tablet (25 mg total) by mouth daily. 90 tablet 3   Olopatadine  HCl 0.2 % SOLN Apply 1 drop to eye in the morning and at bedtime. 2.5 mL 3   omeprazole  (PRILOSEC ) 20 MG capsule Take 1 capsule (20 mg total) by mouth daily. 30 capsule 11   spironolactone  (ALDACTONE ) 25 MG tablet Take 1 tablet (25 mg total) by mouth daily. 90 tablet 2   Tiotropium Bromide-Olodaterol (STIOLTO RESPIMAT ) 2.5-2.5 MCG/ACT AERS Inhale 2 puffs into the lungs daily. 4 g 5   Zinc 50 MG TABS Take 50 mg by mouth daily.     Semaglutide , 1 MG/DOSE, (OZEMPIC , 1 MG/DOSE,) 4 MG/3ML SOPN Inject 1 mg into the skin once a week.     No facility-administered  medications prior to visit.    No Known Allergies  Social History   Socioeconomic History   Marital status: Single    Spouse name: Not on file   Number of children: 1   Years of education: Not on file   Highest education level: Not on file  Occupational History    Occupation: retired  Tobacco Use   Smoking status: Former    Current packs/day: 0.00    Average packs/day: 1 pack/day for 38.0 years (38.0 ttl pk-yrs)    Types: Cigarettes    Start date: 29    Quit date: 2013    Years since quitting: 12.6    Passive exposure: Never   Smokeless tobacco: Never  Vaping Use   Vaping status: Never Used  Substance and Sexual Activity   Alcohol use: Yes    Comment: socially   Drug use: No   Sexual activity: Not Currently  Other Topics Concern   Not on file  Social History Narrative   Not on file   Social Drivers of Health   Financial Resource Strain: Patient Declined (09/14/2023)   Overall Financial Resource Strain (CARDIA)    Difficulty of Paying Living Expenses: Patient declined  Food Insecurity: Patient Declined (09/14/2023)   Hunger Vital Sign    Worried About Running Out of Food in the Last Year: Patient declined    Ran Out of Food in the Last Year: Patient declined  Transportation Needs: Patient Declined (09/14/2023)   PRAPARE - Administrator, Civil Service (Medical): Patient declined    Lack of Transportation (Non-Medical): Patient declined  Physical Activity: Unknown (09/14/2023)   Exercise Vital Sign    Days of Exercise per Week: 3 days    Minutes of Exercise per Session: Patient declined  Stress: No Stress Concern Present (09/14/2023)   [employer] of Occupational Health - Occupational Stress Questionnaire    Feeling of Stress: Not at all  Social Connections: Unknown (09/14/2023)   Social Connection and Isolation Panel    Frequency of Communication with Friends and Family: Patient declined    Frequency of Social Gatherings with Friends and Family: Patient declined    Attends Religious Services: Patient declined    Database administrator or Organizations: Patient declined    Attends Engineer, structural: Not on file    Marital Status: Patient declined           Objective:  Physical Exam: BP 130/70  (BP Location: Right Arm, Patient Position: Sitting, Cuff Size: Large) Comment: recheck done manual  Pulse 78   Temp (!) 97 F (36.1 C) (Temporal)   Resp 18   Wt (!) 352 lb 3.2 oz (159.8 kg)   SpO2 100%   BMI 50.54 kg/m   Body mass index is 50.54 kg/m. Wt Readings from Last 3 Encounters:  09/18/23 (!) 352 lb 3.2 oz (159.8 kg)  08/25/23 (!) 357 lb (161.9 kg)  08/20/23 (!) 357 lb (161.9 kg)    Physical Exam VITALS: BP- 130/70 GENERAL: Alert, cooperative, well developed, no acute distress HEENT: Normocephalic, normal oropharynx, moist mucous membranes CHEST: Clear to auscultation bilaterally, no wheezes, rhonchi, or crackles CARDIOVASCULAR: Normal heart rate and rhythm, S1 and S2 normal without murmurs ABDOMEN: Soft, non-tender, non-distended, without organomegaly, normal bowel sounds MUSCULOSKELETAL: Shoulder tender on palpation, left hip non-tender on palpation, lower right back spasm and tender on palpation, bilateral knees non-tender on palpation, left great toe tender on palpation, mild erythema and edema w/o signs of trauma NEUROLOGICAL:  Cranial nerves grossly intact, moves all extremities without gross motor or sensory deficit  Physical Exam      Prior labs:   Recent Results (from the past 2160 hours)  CBC     Status: Abnormal   Collection Time: 08/11/23 11:45 AM  Result Value Ref Range   WBC 4.8 3.4 - 10.8 x10E3/uL   RBC 4.30 3.77 - 5.28 x10E6/uL   Hemoglobin 10.8 (L) 11.1 - 15.9 g/dL   Hematocrit 64.0 65.9 - 46.6 %   MCV 84 79 - 97 fL   MCH 25.1 (L) 26.6 - 33.0 pg   MCHC 30.1 (L) 31.5 - 35.7 g/dL   RDW 82.9 (H) 88.2 - 84.5 %   Platelets 202 150 - 450 x10E3/uL   NRBC 1 (H) 0 - 0 %  Basic metabolic panel with GFR     Status: Abnormal   Collection Time: 08/11/23 11:45 AM  Result Value Ref Range   Glucose 95 70 - 99 mg/dL   BUN 20 8 - 27 mg/dL   Creatinine, Ser 8.59 (H) 0.57 - 1.00 mg/dL   eGFR 41 (L) >40 fO/fpw/8.26   BUN/Creatinine Ratio 14 12 - 28   Sodium  142 134 - 144 mmol/L   Potassium 4.5 3.5 - 5.2 mmol/L   Chloride 108 (H) 96 - 106 mmol/L   CO2 CANCELED mmol/L    Comment: [facility] was unable to collect sufficient specimen to perform the following test(s), and is providing the patient with re-collection instructions.  Result canceled by the ancillary.    Calcium  9.2 8.7 - 10.3 mg/dL  CBC     Status: Abnormal   Collection Time: 09/12/23  1:31 PM  Result Value Ref Range   WBC 5.4 3.4 - 10.8 x10E3/uL   RBC 4.56 3.77 - 5.28 x10E6/uL   Hemoglobin 11.6 11.1 - 15.9 g/dL   Hematocrit 61.4 65.9 - 46.6 %   MCV 84 79 - 97 fL   MCH 25.4 (L) 26.6 - 33.0 pg   MCHC 30.1 (L) 31.5 - 35.7 g/dL   RDW 82.3 (H) 88.2 - 84.5 %   Platelets 212 150 - 450 x10E3/uL  Basic metabolic panel with GFR     Status: Abnormal   Collection Time: 09/12/23  1:31 PM  Result Value Ref Range   Glucose 85 70 - 99 mg/dL   BUN 16 8 - 27 mg/dL   Creatinine, Ser 8.69 (H) 0.57 - 1.00 mg/dL   eGFR 45 (L) >40 fO/fpw/8.26   BUN/Creatinine Ratio 12 12 - 28   Sodium 140 134 - 144 mmol/L   Potassium 4.7 3.5 - 5.2 mmol/L   Chloride 106 96 - 106 mmol/L   CO2 21 20 - 29 mmol/L   Calcium  9.5 8.7 - 10.3 mg/dL    Lab Results  Component Value Date   CHOL 133 12/16/2022   CHOL 153 03/14/2022   CHOL 141 06/12/2021   Lab Results  Component Value Date   HDL 43.40 12/16/2022   HDL 52.70 03/14/2022   HDL 53.70 06/12/2021   Lab Results  Component Value Date   LDLCALC 66 12/16/2022   LDLCALC 72 03/14/2022   LDLCALC 69 06/12/2021   Lab Results  Component Value Date   TRIG 114.0 12/16/2022   TRIG 140.0 03/14/2022   TRIG 93.0 06/12/2021   Lab Results  Component Value Date   CHOLHDL 3 12/16/2022   CHOLHDL 3 03/14/2022   CHOLHDL 3 06/12/2021   No results found for: LDLDIRECT  Last metabolic  panel Lab Results  Component Value Date   GLUCOSE 85 09/12/2023   NA 140 09/12/2023   K 4.7 09/12/2023   CL 106 09/12/2023   CO2 21 09/12/2023   BUN 16 09/12/2023    CREATININE 1.30 (H) 09/12/2023   EGFR 45 (L) 09/12/2023   CALCIUM  9.5 09/12/2023   PROT 7.7 12/16/2022   ALBUMIN 4.1 12/16/2022   LABGLOB 3.2 02/02/2020   AGRATIO 1.3 02/02/2020   BILITOT 0.5 12/16/2022   ALKPHOS 85 12/16/2022   AST 17 12/16/2022   ALT 17 12/16/2022   ANIONGAP 9 12/30/2020    Lab Results  Component Value Date   HGBA1C 5.9 (A) 06/17/2023    Last CBC Lab Results  Component Value Date   WBC 5.4 09/12/2023   HGB 11.6 09/12/2023   HCT 38.5 09/12/2023   MCV 84 09/12/2023   MCH 25.4 (L) 09/12/2023   RDW 17.6 (H) 09/12/2023   PLT 212 09/12/2023    Lab Results  Component Value Date   TSH 1.78 12/16/2022    No results found for: PSA1, PSA  Last vitamin D  Lab Results  Component Value Date   VD25OH 54.1 02/02/2020    Lab Results  Component Value Date   BILIRUBINUR NEGATIVE 03/14/2022   PROTEINUR NEGATIVE 12/16/2022   UROBILINOGEN 0.2 03/14/2022   LEUKOCYTESUR NEGATIVE 03/14/2022    No results found for: LABMICR, MICROALBUR   At today's visit, we discussed treatment options, associated risk and benefits, and engage in counseling as needed.  Additionally the following were reviewed: Past medical records, past medical and surgical history, family and social background, as well as relevant laboratory results, imaging findings, and specialty notes, where applicable.  This message was generated using dictation software, and as a result, it may contain unintentional typos or errors.  Nevertheless, extensive effort was made to accurately convey at the pertinent aspects of the patient visit.    There may have been are other unrelated non-urgent complaints, but due to the busy schedule and the amount of time already spent with her, time does not permit to address these issues at today's visit. Another appointment may have or has been requested to review these additional issues.     Arvella Hummer, MD, MS

## 2023-09-19 LAB — URINALYSIS W MICROSCOPIC + REFLEX CULTURE
Bacteria, UA: NONE SEEN /HPF
Bilirubin Urine: NEGATIVE
Hgb urine dipstick: NEGATIVE
Leukocyte Esterase: NEGATIVE
Nitrites, Initial: NEGATIVE
Protein, ur: NEGATIVE
Specific Gravity, Urine: 1.026 (ref 1.001–1.035)
WBC, UA: NONE SEEN /HPF (ref 0–5)
pH: 5.5 (ref 5.0–8.0)

## 2023-09-19 LAB — NO CULTURE INDICATED

## 2023-09-19 NOTE — Telephone Encounter (Signed)
 Reached out to patient again regarding sending in any OOP pharmacy statements- for Eliquis  (BMS) she will send in a statement-

## 2023-09-20 ENCOUNTER — Other Ambulatory Visit (HOSPITAL_COMMUNITY): Payer: Self-pay

## 2023-09-22 NOTE — Telephone Encounter (Signed)
 Please call pt and document.  The providers within the office support Dr. Oswald care plan and believe it is in the patient's best interest to continue care with their current provider. However, the patient does have the option to transfer to another [place] office should they feel it is necessary.

## 2023-09-24 ENCOUNTER — Ambulatory Visit: Payer: Self-pay | Admitting: Family Medicine

## 2023-09-24 DIAGNOSIS — E1122 Type 2 diabetes mellitus with diabetic chronic kidney disease: Secondary | ICD-10-CM

## 2023-09-26 ENCOUNTER — Other Ambulatory Visit: Payer: Self-pay

## 2023-09-26 ENCOUNTER — Other Ambulatory Visit: Payer: Self-pay | Admitting: *Deleted

## 2023-09-26 ENCOUNTER — Encounter: Payer: Self-pay | Admitting: *Deleted

## 2023-09-26 DIAGNOSIS — M19079 Primary osteoarthritis, unspecified ankle and foot: Secondary | ICD-10-CM | POA: Insufficient documentation

## 2023-09-26 DIAGNOSIS — I70203 Unspecified atherosclerosis of native arteries of extremities, bilateral legs: Secondary | ICD-10-CM | POA: Insufficient documentation

## 2023-09-26 DIAGNOSIS — M2041 Other hammer toe(s) (acquired), right foot: Secondary | ICD-10-CM | POA: Insufficient documentation

## 2023-09-26 DIAGNOSIS — E1142 Type 2 diabetes mellitus with diabetic polyneuropathy: Secondary | ICD-10-CM | POA: Insufficient documentation

## 2023-09-26 NOTE — Patient Instructions (Addendum)
 Visit Information  Thank you for taking time to visit with me today. Please don't hesitate to contact me if I can be of assistance to you before our next scheduled appointment.  Your next care management appointment is by telephone on 3:45 pm at 09/30/23  Review your options and speak with your friend and or HTA concierge, Call RN CCM if needed before 09/30/23  With glancing your HTA 2025 cardinal HMO plan, options include no [place] MDs populated other than [employer] Other services in [place] are [facility] providers do far   Please call the care guide team at [phone] if you need to cancel, schedule, or reschedule an appointment.   Please call the Suicide and Crisis Lifeline: 988 call the [place]  National Suicide Prevention Lifeline: 343-623-8038 or TTY: (626)449-1212 TTY 262-675-0307) to talk to a trained counselor call 1-800-273-TALK (toll free, 24 hour hotline) go to [facility] [address] [place] [phone]) call 911 if you are experiencing a Mental Health or Behavioral Health Crisis or need someone to talk to.  Misty Rago L. Ramonita, RN, BSN, CCM [place]  [facility], [facility] RN Care Manager Direct Dial: [phone]  Fax: [phone]

## 2023-09-26 NOTE — Patient Outreach (Signed)
 Complex Care Management   Visit Note  09/26/2023  Name:  Rose Miller MRN: [id number] DOB: April 10, 1955  Situation: Referral received for Complex Care Management related to Diabetes with Complications, Atrial Fibrillation, and Hypertension I obtained verbal consent from Patient.  Visit completed with Patient  on the phone  Patient returned a call around 1644 after RN CM had 2 unsuccessful outreaches   Rose Miller reports during this follow up that she is doing well She denied issues with managing her goals. She is maintaining her blood pressure and blood glucose well  09/18/23 HgA1c = 6  She is satisfied with the intentional loss of weight with use of Ozempic   She denies worsening symptoms with atrial fibrillation   Exploring other care options discussed  Patient shared her concerns and voiced understanding during a discussion of a potential plan of action to benefit her    Background:   Past Medical History:  Diagnosis Date   Adhesive capsulitis    Aortic valve disease    Arthritis    Asthma    Back pain    CKD (chronic kidney disease) stage 3, GFR 30-59 ml/min (HCC)    Constipation    COPD (chronic obstructive pulmonary disease) (HCC)    Diabetes mellitus without complication (HCC) 10 20 2020   Diuretic-induced hypokalemia    Heart murmur    Hypercholesterolemia    Hypertension    IT band syndrome    Morbid obesity (HCC)    Palpitations    Periumbilical pain    Postmenopausal estrogen deficiency    Prediabetes    S/P TAVR (transcatheter aortic valve replacement) 12/26/2020   Ventral hernia     Assessment: Patient Reported Symptoms:  Cognitive Cognitive Status: Alert and oriented to person, place, and time, Insightful and able to interpret abstract concepts, Normal speech and language skills Cognitive/Intellectual Conditions Management [RPT]: None reported or documented in medical history or problem list   Health Maintenance Behaviors: Annual physical exam, Sleep  adequate, Healthy diet Healing Pattern: Average Health Facilitated by: Healthy diet, Rest  Neurological Neurological Review of Symptoms: No symptoms reported Neurological Management Strategies: Adequate rest, Routine screening Neurological Self-Management Outcome: 4 (good)  HEENT HEENT Symptoms Reported: No symptoms reported HEENT Management Strategies: Routine screening HEENT Self-Management Outcome: 4 (good)    Cardiovascular Cardiovascular Symptoms Reported: No symptoms reported Does patient have uncontrolled Hypertension?: No Cardiovascular Management Strategies: Adequate rest, Medical device, Medication therapy, Routine screening Cardiovascular Self-Management Outcome: 4 (good)  Respiratory Respiratory Symptoms Reported: No symptoms reported Respiratory Management Strategies: Routine screening Respiratory Self-Management Outcome: 4 (good)  Endocrine Endocrine Symptoms Reported: No symptoms reported Is patient diabetic?: Yes Is patient checking blood sugars at home?: Yes List most recent blood sugar readings, include date and time of day: cbg 117 Endocrine Self-Management Outcome: 4 (good) Endocrine Comment: on ozempic  sinc early beginning of 2025  Gastrointestinal Gastrointestinal Symptoms Reported: Constipation Additional Gastrointestinal Details: will monitor hydration when using miralax Gastrointestinal Management Strategies: Medication therapy Gastrointestinal Self-Management Outcome: 4 (good)    Genitourinary Genitourinary Symptoms Reported: No symptoms reported Genitourinary Self-Management Outcome: 4 (good)  Integumentary Integumentary Symptoms Reported: No symptoms reported Skin Self-Management Outcome: 4 (good)  Musculoskeletal Musculoskelatal Symptoms Reviewed: Joint pain Musculoskeletal Management Strategies: Medical device, Medication therapy, Routine screening Musculoskeletal Self-Management Outcome: 4 (good) Falls in the past year?: No Number of falls in past  year: 1 or less Was there an injury with Fall?: No Fall Risk Category Calculator: 0 Patient Fall Risk Level: Low Fall Risk Patient at  Risk for Falls Due to: History of fall(s) Fall risk Follow up: Falls evaluation completed  Psychosocial Psychosocial Symptoms Reported: No symptoms reported Behavioral Management Strategies: Activity, Coping strategies, Exercise Behavioral Health Self-Management Outcome: 4 (good) Major Change/Loss/Stressor/Fears (CP): Resources Techniques to [employer] with Loss/Stress/Change: Diversional activities, Exercise Quality of Family Relationships: helpful, involved, supportive Do you feel physically threatened by others?: No    09/26/2023    PHQ2-9 Depression Screening   Little interest or pleasure in doing things Not at all  Feeling down, depressed, or hopeless Not at all  PHQ-2 - Total Score 0  Trouble falling or staying asleep, or sleeping too much    Feeling tired or having little energy    Poor appetite or overeating     Feeling bad about yourself - or that you are a failure or have let yourself or your family down    Trouble concentrating on things, such as reading the newspaper or watching television    Moving or speaking so slowly that other people could have noticed.  Or the opposite - being so fidgety or restless that you have been moving around a lot more than usual    Thoughts that you would be better off dead, or hurting yourself in some way    PHQ2-9 Total Score    If you checked off any problems, how difficult have these problems made it for you to do your work, take care of things at home, or get along with other people    Depression Interventions/Treatment      There were no vitals filed for this visit.  Medications Reviewed Today   Medications were not reviewed in this encounter     Recommendation:   PCP Follow-up Continue Current Plan of Care Seek assist from Insurance Concierge or NR CM as needed  Follow Up Plan:   Telephone follow  up appointment date/time:  09/30/23 3:45 pm   Ernest Popowski L. Ramonita, RN, BSN, CCM [place]  [facility], [facility] RN Care Manager Direct Dial: [phone]  Fax: [phone]

## 2023-09-29 ENCOUNTER — Other Ambulatory Visit (HOSPITAL_COMMUNITY): Payer: Self-pay

## 2023-09-30 ENCOUNTER — Ambulatory Visit: Admitting: Skilled Nursing Facility1

## 2023-09-30 ENCOUNTER — Other Ambulatory Visit: Payer: Self-pay | Admitting: *Deleted

## 2023-10-01 ENCOUNTER — Other Ambulatory Visit: Payer: Self-pay

## 2023-10-01 NOTE — Patient Outreach (Signed)
 Complex Care Management   Visit Note  10/01/2023  Name:  Rose Miller MRN: [id number] DOB: 04/12/1955  Situation: Referral received for Complex Care Management related to Diabetes with Complications, Atrial Fibrillation, and hypertension I obtained verbal consent from Patient.  Visit completed with Patient  on the phone  Patient reports understanding of information reviewed Her preferences were discussed She agreed to an outreach conference to MD office A voice message was left Pending response from MD office + RN CM will recall  Background:   Past Medical History:  Diagnosis Date   Adhesive capsulitis    Aortic valve disease    Arthritis    Asthma    Back pain    CKD (chronic kidney disease) stage 3, GFR 30-59 ml/min (HCC)    Constipation    COPD (chronic obstructive pulmonary disease) (HCC)    Diabetes mellitus without complication (HCC) 10 20 2020   Diuretic-induced hypokalemia    Heart murmur    Hypercholesterolemia    Hypertension    IT band syndrome    Morbid obesity (HCC)    Palpitations    Periumbilical pain    Postmenopausal estrogen deficiency    Prediabetes    S/P TAVR (transcatheter aortic valve replacement) 12/26/2020   Ventral hernia     Assessment: Patient Reported Symptoms:  Cognitive Cognitive Status: No symptoms reported, Insightful and able to interpret abstract concepts, Alert and oriented to person, place, and time, Normal speech and language skills      Neurological Neurological Review of Symptoms: No symptoms reported Neurological Management Strategies: Routine screening, Adequate rest Neurological Self-Management Outcome: 4 (good)  HEENT HEENT Symptoms Reported: No symptoms reported HEENT Management Strategies: Routine screening HEENT Self-Management Outcome: 4 (good)    Cardiovascular Cardiovascular Symptoms Reported: No symptoms reported Cardiovascular Self-Management Outcome: 4 (good)  Respiratory Respiratory Symptoms Reported: No  symptoms reported Respiratory Self-Management Outcome: 4 (good)  Endocrine Endocrine Symptoms Reported: No symptoms reported Endocrine Self-Management Outcome: 4 (good)  Gastrointestinal Gastrointestinal Symptoms Reported: Constipation Gastrointestinal Management Strategies: Medication therapy Gastrointestinal Self-Management Outcome: 4 (good)    Genitourinary Genitourinary Symptoms Reported: No symptoms reported Genitourinary Self-Management Outcome: 4 (good)  Integumentary Integumentary Symptoms Reported: No symptoms reported Skin Self-Management Outcome: 4 (good)  Musculoskeletal Musculoskelatal Symptoms Reviewed: Joint pain Musculoskeletal Self-Management Outcome: 4 (good)      Psychosocial Psychosocial Symptoms Reported: No symptoms reported Behavioral Health Self-Management Outcome: 4 (good)        10/01/2023    PHQ2-9 Depression Screening   Little interest or pleasure in doing things Not at all  Feeling down, depressed, or hopeless Not at all  PHQ-2 - Total Score 0  Trouble falling or staying asleep, or sleeping too much    Feeling tired or having little energy    Poor appetite or overeating     Feeling bad about yourself - or that you are a failure or have let yourself or your family down    Trouble concentrating on things, such as reading the newspaper or watching television    Moving or speaking so slowly that other people could have noticed.  Or the opposite - being so fidgety or restless that you have been moving around a lot more than usual    Thoughts that you would be better off dead, or hurting yourself in some way    PHQ2-9 Total Score    If you checked off any problems, how difficult have these problems made it for you to do your work, take care of  things at home, or get along with other people    Depression Interventions/Treatment      There were no vitals filed for this visit.  Medications Reviewed Today   Medications were not reviewed in this encounter      Recommendation:   PCP Follow-up Continue Current Plan of Care Outreach as needed  Follow Up Plan:   Telephone follow up appointment date/time:  10/03/23 345 pm  Marietta Sikkema L. Ramonita, RN, BSN, CCM [place]  [facility], [facility] Health RN Care Manager Direct Dial: [phone]  Fax: [phone]

## 2023-10-01 NOTE — Patient Instructions (Signed)
 Visit Information  Thank you for taking time to visit with me today. Please don't hesitate to contact me if I can be of assistance to you before our next scheduled appointment.  Your next care management appointment is by telephone on 10/03/23 at 3:45 pm   RN CM called the discussed MD office on 9/16 & 10/01/23 The list of doctors accepting new patients found on HTA on line site [email or web address] [phone]   HTA Plan Year 2025 Showing: 8 Entries  09/30/2023 Specialty: General Practice Region: [place] ([place], [place], [place], [place], [place], [place], [place], [place], [place], [place], [place])  Accepting: Yes  Provider Language: English  Zip: [phone] Sorted By: Archibald Murray, Avelina Hastings General Practice [facility] [address] [place], [place], [address] [phone] Accepts patients: Yes   Murray Avelina Hastings General Practice [facility] [address] [place], [place], [address] [phone] Accepts patients: Yes   [place], Suzann Bryant General Practice [facility] [address] [place], [place], [address] [phone] Accepts patients: Yes  Elizbeth Leita Ruth [facility], [place] [address] [place], [place], [address] [phone] Accepts patients: Yes   Jerel Gee [facility], [place] [address] [place], [place], [address] [phone] Accepts patients: Yes  Dreama Dena Ruth General Practice [facility] [address] [place], [place], [address] [phone] Accepts patients: Yes  Valora Maus [facility] General Practice [facility] [address] [place], [place], [address] [phone] Accepts patients: Yes  Levy Vinie Lin General Practice [facility] [address] [place], [place], [address] [phone] Accepts patients: Yes   Please call the care guide team at [phone] if you need to cancel, schedule, or reschedule an appointment.   Please call the Suicide and Crisis Lifeline: 988 call the [place]  National Suicide Prevention Lifeline: (902)256-6706 or TTY: (301)095-2489 TTY 825-202-7097) to talk to a trained counselor call 1-800-273-TALK (toll free, 24 hour hotline) call the [facility]: [phone] call 911 if you are experiencing a Mental Health or Behavioral Health Crisis or need someone to talk to.  Tevis Dunavan L. Ramonita, RN, BSN, CCM   [facility], [facility] Health RN Care Manager Direct Dial: [phone]  Fax: [phone]

## 2023-10-03 ENCOUNTER — Other Ambulatory Visit: Payer: Self-pay | Admitting: *Deleted

## 2023-10-03 NOTE — Patient Outreach (Signed)
 Complex Care Management   Visit Note  10/23/2023 update for 10/03/23  Name:  Rose Miller MRN: [id number] DOB: [birth year]  Situation: Referral received for Complex Care Management related to Diabetes with Complications, Atrial Fibrillation, and hypertension I obtained verbal consent from Patient.  Visit completed with Patient  on the phone  Goes to [facility] and has an appointment in March 2026 She confirms she does not need the Nephrology referral  ordered in [facility]    Thursday 10/09/23 1430 pm   Today the patient is trying to outreach to the pcp or [facility] to schedule her Renal Ultrasound & CT with no success She agreed to complete a conference call to schedule these imaging procedure for Friday October 17 2023  11 & 12 At [facility] Drink 32 oz water prior to arrival  Stopped smoking in 2015 -10 years ago   Went to CV labs anemia kidney function 09/12/23 labs   Background:   Past Medical History:  Diagnosis Date   Adhesive capsulitis    Aortic valve disease    Arthritis    Asthma    Back pain    CKD (chronic kidney disease) stage 3, GFR 30-59 ml/min (HCC)    Constipation    COPD (chronic obstructive pulmonary disease) (HCC)    Diabetes mellitus without complication (HCC) 10 20 2020   Diuretic-induced hypokalemia    Heart murmur    Hypercholesterolemia    Hypertension    IT band syndrome    Morbid obesity (HCC)    Palpitations    Periumbilical pain    Postmenopausal estrogen deficiency    Prediabetes    S/P TAVR (transcatheter aortic valve replacement) 12/26/2020   Ventral hernia     Assessment: Patient Reported Symptoms:  Cognitive Cognitive Status: No symptoms reported      Neurological Neurological Review of Symptoms: No symptoms reported    HEENT HEENT Symptoms Reported: No symptoms reported HEENT Self-Management Outcome: 4 (good)    Cardiovascular Does patient have uncontrolled Hypertension?: Yes Is patient checking Blood  Pressure at home?: No Cardiovascular Management Strategies: Medication therapy, Routine screening Cardiovascular Self-Management Outcome: 3 (uncertain)  Respiratory Respiratory Symptoms Reported: No symptoms reported Respiratory Self-Management Outcome: 4 (good)  Endocrine Endocrine Symptoms Reported: Increased thirst Is patient diabetic?: Yes Is patient checking blood sugars at home?: No Endocrine Self-Management Outcome: 3 (uncertain)  Gastrointestinal Gastrointestinal Symptoms Reported: No symptoms reported Gastrointestinal Self-Management Outcome: 4 (good)    Genitourinary Genitourinary Symptoms Reported: No symptoms reported Genitourinary Self-Management Outcome: 4 (good)  Integumentary Integumentary Symptoms Reported: No symptoms reported Skin Self-Management Outcome: 4 (good)  Musculoskeletal Musculoskelatal Symptoms Reviewed: Joint pain Musculoskeletal Management Strategies: Medication therapy, Routine screening Musculoskeletal Self-Management Outcome: 3 (uncertain)      Psychosocial Psychosocial Symptoms Reported: No symptoms reported Behavioral Health Self-Management Outcome: 4 (good)   Quality of Family Relationships: helpful, supportive Do you feel physically threatened by others?: No    10/23/2023    PHQ2-9 Depression Screening   Little interest or pleasure in doing things    Feeling down, depressed, or hopeless    PHQ-2 - Total Score    Trouble falling or staying asleep, or sleeping too much    Feeling tired or having little energy    Poor appetite or overeating     Feeling bad about yourself - or that you are a failure or have let yourself or your family down    Trouble concentrating on things, such as reading the newspaper or watching  television    Moving or speaking so slowly that other people could have noticed.  Or the opposite - being so fidgety or restless that you have been moving around a lot more than usual    Thoughts that you would be better off dead,  or hurting yourself in some way    PHQ2-9 Total Score    If you checked off any problems, how difficult have these problems made it for you to do your work, take care of things at home, or get along with other people    Depression Interventions/Treatment      There were no vitals filed for this visit.  Medications Reviewed Today   Medications were not reviewed in this encounter     Recommendation:   PCP Follow-up Specialty provider follow-up dietitian Continue Current Plan of Care  Follow Up Plan:   Telephone follow up appointment date/time:  11/03/23   Suzen L. Ramonita, RN, BSN, CCM [place]  [facility], [facility] Health RN Care Manager Direct Dial: [phone]  Fax: [phone]

## 2023-10-07 ENCOUNTER — Other Ambulatory Visit (HOSPITAL_COMMUNITY): Payer: Self-pay

## 2023-10-07 ENCOUNTER — Other Ambulatory Visit: Payer: Self-pay | Admitting: Family Medicine

## 2023-10-07 ENCOUNTER — Other Ambulatory Visit: Payer: Self-pay | Admitting: Cardiovascular Disease

## 2023-10-07 DIAGNOSIS — Z952 Presence of prosthetic heart valve: Secondary | ICD-10-CM

## 2023-10-07 DIAGNOSIS — E1142 Type 2 diabetes mellitus with diabetic polyneuropathy: Secondary | ICD-10-CM | POA: Diagnosis not present

## 2023-10-07 DIAGNOSIS — M2041 Other hammer toe(s) (acquired), right foot: Secondary | ICD-10-CM | POA: Diagnosis not present

## 2023-10-07 DIAGNOSIS — Z7901 Long term (current) use of anticoagulants: Secondary | ICD-10-CM

## 2023-10-07 DIAGNOSIS — I350 Nonrheumatic aortic (valve) stenosis: Secondary | ICD-10-CM

## 2023-10-07 DIAGNOSIS — R600 Localized edema: Secondary | ICD-10-CM | POA: Diagnosis not present

## 2023-10-07 DIAGNOSIS — I10 Essential (primary) hypertension: Secondary | ICD-10-CM

## 2023-10-07 DIAGNOSIS — B351 Tinea unguium: Secondary | ICD-10-CM | POA: Diagnosis not present

## 2023-10-07 DIAGNOSIS — I739 Peripheral vascular disease, unspecified: Secondary | ICD-10-CM | POA: Diagnosis not present

## 2023-10-07 DIAGNOSIS — M792 Neuralgia and neuritis, unspecified: Secondary | ICD-10-CM | POA: Diagnosis not present

## 2023-10-07 DIAGNOSIS — Z79899 Other long term (current) drug therapy: Secondary | ICD-10-CM

## 2023-10-07 DIAGNOSIS — E782 Mixed hyperlipidemia: Secondary | ICD-10-CM

## 2023-10-07 DIAGNOSIS — I351 Nonrheumatic aortic (valve) insufficiency: Secondary | ICD-10-CM

## 2023-10-07 DIAGNOSIS — E1169 Type 2 diabetes mellitus with other specified complication: Secondary | ICD-10-CM

## 2023-10-07 MED ORDER — SPIRONOLACTONE 25 MG PO TABS
25.0000 mg | ORAL_TABLET | Freq: Every day | ORAL | 2 refills | Status: AC
  Filled 2023-10-07: qty 90, 90d supply, fill #0
  Filled 2024-01-06: qty 90, 90d supply, fill #1

## 2023-10-08 ENCOUNTER — Other Ambulatory Visit: Payer: Self-pay

## 2023-10-08 ENCOUNTER — Other Ambulatory Visit (HOSPITAL_COMMUNITY): Payer: Self-pay

## 2023-10-08 ENCOUNTER — Ambulatory Visit: Attending: Family Medicine

## 2023-10-08 DIAGNOSIS — M5459 Other low back pain: Secondary | ICD-10-CM | POA: Insufficient documentation

## 2023-10-08 DIAGNOSIS — G8929 Other chronic pain: Secondary | ICD-10-CM | POA: Diagnosis not present

## 2023-10-08 DIAGNOSIS — M5442 Lumbago with sciatica, left side: Secondary | ICD-10-CM | POA: Diagnosis not present

## 2023-10-08 DIAGNOSIS — M25562 Pain in left knee: Secondary | ICD-10-CM | POA: Diagnosis not present

## 2023-10-08 DIAGNOSIS — R293 Abnormal posture: Secondary | ICD-10-CM | POA: Insufficient documentation

## 2023-10-08 DIAGNOSIS — M25552 Pain in left hip: Secondary | ICD-10-CM | POA: Insufficient documentation

## 2023-10-08 DIAGNOSIS — R262 Difficulty in walking, not elsewhere classified: Secondary | ICD-10-CM | POA: Diagnosis not present

## 2023-10-08 DIAGNOSIS — M25561 Pain in right knee: Secondary | ICD-10-CM | POA: Insufficient documentation

## 2023-10-08 MED ORDER — ONETOUCH DELICA PLUS LANCET33G MISC
1.0000 | Freq: Three times a day (TID) | 0 refills | Status: AC
  Filled 2023-10-08: qty 100, 33d supply, fill #0

## 2023-10-08 NOTE — Therapy (Unsigned)
 OUTPATIENT PHYSICAL THERAPY LOWER EXTREMITY EVALUATION   Patient Name: Rose Miller MRN: [id number] DOB:[birth year], 68 y.o., female Today's Date: 10/09/2023  END OF SESSION:  PT End of Session - 10/09/23 1711     Visit Number 1    Date for Recertification  01/01/24    PT Start Time 1345    PT Stop Time 1430    PT Time Calculation (min) 45 min    Activity Tolerance Patient tolerated treatment well    Behavior During Therapy [facility] for tasks assessed/performed          Past Medical History:  Diagnosis Date   Adhesive capsulitis    Aortic valve disease    Arthritis    Asthma    Back pain    CKD (chronic kidney disease) stage 3, GFR 30-59 ml/min (HCC)    Constipation    COPD (chronic obstructive pulmonary disease) (HCC)    Diabetes mellitus without complication (HCC) 10 20 2020   Diuretic-induced hypokalemia    Heart murmur    Hypercholesterolemia    Hypertension    IT band syndrome    Morbid obesity (HCC)    Palpitations    Periumbilical pain    Postmenopausal estrogen deficiency    Prediabetes    S/P TAVR (transcatheter aortic valve replacement) 12/26/2020   Ventral hernia    Past Surgical History:  Procedure Laterality Date   BIOPSY  05/01/2021   Procedure: BIOPSY;  Surgeon: Charlanne Groom, MD;  Location: [facility];  Service: Gastroenterology;;   CARDIAC CATHETERIZATION  11/24/2020   CESAREAN SECTION  1989   COLONOSCOPY     COLONOSCOPY WITH PROPOFOL  N/A 05/01/2021   Procedure: COLONOSCOPY WITH PROPOFOL ;  Surgeon: Charlanne Groom, MD;  Location: [facility];  Service: Gastroenterology;  Laterality: N/A;   ESOPHAGOGASTRODUODENOSCOPY (EGD) WITH PROPOFOL  N/A 05/01/2021   Procedure: ESOPHAGOGASTRODUODENOSCOPY (EGD) WITH PROPOFOL ;  Surgeon: Charlanne Groom, MD;  Location: [facility];  Service: Gastroenterology;  Laterality: N/A;   INTRAOPERATIVE TRANSTHORACIC ECHOCARDIOGRAM N/A 12/26/2020   Procedure: INTRAOPERATIVE TRANSTHORACIC ECHOCARDIOGRAM;  Surgeon:  Wendel Lurena POUR, MD;  Location: [facility];  Service: Open Heart Surgery;  Laterality: N/A;   MALONEY DILATION  05/01/2021   Procedure: AGAPITO DILATION;  Surgeon: Charlanne Groom, MD;  Location: [facility];  Service: Gastroenterology;;   POLYPECTOMY  05/01/2021   Procedure: POLYPECTOMY;  Surgeon: Charlanne Groom, MD;  Location: [facility];  Service: Gastroenterology;;   RIGHT/LEFT HEART CATH AND CORONARY ANGIOGRAPHY N/A 11/24/2020   Procedure: RIGHT/LEFT HEART CATH AND CORONARY ANGIOGRAPHY;  Surgeon: Claudene Victory ORN, MD;  Location: [facility] INVAS[facility] CV LAB;  Service: Cardiovascular;  Laterality: N/A;   TONSILLECTOMY AND ADENOIDECTOMY  1960   TRANSCATHETER AORTIC VALVE REPLACEMENT, TRANSFEMORAL N/A 12/26/2020   Procedure: TRANSCATHETER AORTIC VALVE REPLACEMENT, TRANSFEMORAL;  Surgeon: Wendel Lurena POUR, MD;  Location: [facility];  Service: Open Heart Surgery;  Laterality: N/A;   Patient Active Problem List   Diagnosis Date Noted   Acquired hammer toe of right foot 09/26/2023   Atherosclerosis of artery of both lower extremities 09/26/2023   Polyneuropathy due to type 2 diabetes mellitus (HCC) 09/26/2023   Primary localized osteoarthrosis of ankle and foot 09/26/2023   Kidney lesion 04/14/2023   Abscess of lower back 03/17/2023   Ingrown nail of great toe of left foot 12/16/2022   Vitamin D  deficiency 12/16/2022   Low back pain 12/16/2022   Chronic pain of both knees 12/16/2022   Dyspnea on exertion 12/16/2022   Coronary artery disease due to  calcified coronary lesion 12/16/2022   Aortic atherosclerosis 12/16/2022   PAF (paroxysmal atrial fibrillation) (HCC) 03/14/2022   Otalgia of left ear 03/14/2022   Diplopia 09/12/2021   Allergic rhinitis 09/12/2021   Pharyngoesophageal dysphagia    Family history of colon cancer in father    Aortic valve disease 12/26/2020   COPD (chronic obstructive pulmonary disease) (HCC) 12/26/2020   Postmenopausal estrogen deficiency 02/18/2019    Former smoker 05/13/2018   Long-term use of aspirin  therapy 05/13/2018   Peripheral vascular disease 05/05/2018   Prediabetes 05/05/2018   Type 2 diabetes mellitus with obesity 03/31/2017   CKD stage 3 secondary to diabetes (HCC) 03/31/2017   Hypercholesterolemia 06/28/2016   Aortic valve stenosis 03/29/2016   Periumbilical pain 03/29/2016   IT band syndrome 09/11/2014   Right shoulder pain 09/11/2014   Ventral hernia 05/01/2014   Morbid obesity (HCC) 03/17/2014    PCP: Sebastian Righter MD  REFERRING PROVIDER: same  REFERRING DIAG: lower back, B knee, L hip pain  THERAPY DIAG:  Difficulty in walking, not elsewhere classified  Other low back pain  Chronic pain of both knees  Abnormal posture  Rationale for Evaluation and Treatment: Rehabilitation  ONSET DATE: December 2024  SUBJECTIVE:   SUBJECTIVE STATEMENT: [facility] have just [facility] over the last 2 weeks, my knees and hips hurt but I'm trying, worse with walking  PERTINENT HISTORY: Referred by PCP due to pain complaints knees, hips lower back PAIN:  Are you having pain? Yes: NPRS scale: 0 to6 Pain location: lateral hips B knees primarily lateral jt line Pain description: deep pain with walking Aggravating factors: walking Relieving factors: resting  PRECAUTIONS: Fall  RED FLAGS: None   WEIGHT BEARING RESTRICTIONS: No  FALLS:  Has patient fallen in last 6 months? No  LIVING ENVIRONMENT: Lives with: lives with their son Lives in: House/apartment Stairs: steps outdoors Has following equipment at home: None  OCCUPATION: retired  PLOF: Independent  PATIENT GOALS: be able to walk without pain  NEXT MD VISIT: 1 month  OBJECTIVE:  Note: Objective measures were completed at Evaluation unless otherwise noted.  DIAGNOSTIC FINDINGS: na  PATIENT SURVEYS:  Lower Extremity Functional Score: 35 / 80 = 43.8 %  COGNITION: Overall cognitive status: Within functional  limits for tasks assessed     SENSATION: WFL    MUSCLE LENGTH: Hamstrings: Right wfl deg; Left wfl deg Thomas test: Right nt deg; Left  nt deg  POSTURE: marked valgum B knees, greater on R  PALPATION: Tender L lateral hip musculature, TFL Tender R glut medius, minimus,  LOWER EXTREMITY ROM:  Passive ROM Right eval Left eval  Hip flexion 105 95  Hip extension    Hip abduction    Hip adduction    Hip internal rotation    Hip external rotation    Knee flexion 112 102  Knee extension -3 -12  Ankle dorsiflexion    Ankle plantarflexion    Ankle inversion    Ankle eversion     (Blank rows = not tested)  LOWER EXTREMITY MMT:  MMT Right eval Left eval  Hip flexion 4- 4-  Hip extension    Hip abduction 3+ 3-  Hip adduction    Hip internal rotation    Hip external rotation    Knee flexion    Knee extension    Ankle dorsiflexion    Ankle plantarflexion 4- 4-  Ankle inversion    Ankle eversion     (  Blank rows = not tested)   FUNCTIONAL TESTS:  Timed up and go (TUG): 18.35  GAIT: Distance walked: 40' in clinic, no device, labored gait, slowed cadence decreased stride length B                                                                                                                              TREATMENT DATE: 10/08/23:  Eval:  side lying on each side for theragun each hip lateral musculature with improved Sx.  PATIENT EDUCATION:  Education details: POC, goals Person educated: Patient and Child(ren) Education method: Explanation Education comprehension: verbalized understanding  HOME EXERCISE PROGRAM: TBD  ASSESSMENT:  CLINICAL IMPRESSION: Patient is a 68 y.o. female who was evaluated today for physical therapy due to chronic pain multiple jts. Presents with postural changes remarkably B knee valgum, also weakness B hips, decreased walking tolerance.  Would benefit from PT with combination of manual techniques, therex, modalities to improve sx and  work with [facility] We discussed aquatic therapy due to multiple jt involvement and she is very interested in pursuing this aspect of PT  OBJECTIVE IMPAIRMENTS: cardiopulmonary status limiting activity, decreased activity tolerance, decreased balance, decreased endurance, decreased knowledge of condition, decreased mobility, difficulty walking, decreased ROM, decreased strength, increased fascial restrictions, impaired perceived functional ability, postural dysfunction, and pain.   ACTIVITY LIMITATIONS: carrying, standing, squatting, stairs, transfers, and locomotion level  PARTICIPATION LIMITATIONS: cleaning, laundry, shopping, community activity, and yard work  PERSONAL FACTORS: Behavior pattern, Time since onset of injury/illness/exacerbation, and 1-2 comorbidities: COPD are also affecting patient's functional outcome.   REHAB POTENTIAL: Good  CLINICAL DECISION MAKING: Evolving/moderate complexity  EVALUATION COMPLEXITY: Moderate   GOALS: Goals reviewed with patient? Yes  SHORT TERM GOALS: Target date: 2 weeks, 10/22/23 I HEP Baseline: Goal status: INITIAL   LONG TERM GOALS: Target date: 01/01/24, 12 weeks  LEFSLower Extremity Functional Score: 35 / 80 = 43.8 % improve to 30%or less disability Baseline:  Goal status: INITIAL  2.  TUG improve from 18 to 14 or less Baseline:  Goal status: INITIAL  3.  Strength B hip abd, ext 5/5 Baseline: 3+ and 4-/5 Goal status: INITIAL     PLAN:  PT FREQUENCY: 2x/week  PT DURATION: 12 weeks  PLANNED INTERVENTIONS: 97110-Therapeutic exercises, 97530- Therapeutic activity, 97112- Neuromuscular re-education, 97535- Self Care, 02859- Manual therapy, [phone]- Aquatic Therapy, and Patient/Family education  PLAN FOR NEXT SESSION: cardiovascular equipment, soft tissue treatment B lat hips, therex to engage LE musculature    Lataya Varnell L Manasi Dishon, PT,DPT, OCS 10/09/2023, 5:25 PM

## 2023-10-09 ENCOUNTER — Other Ambulatory Visit: Payer: Self-pay

## 2023-10-10 NOTE — Addendum Note (Signed)
 Addended byBETHA CREDIT, Caitlynne Harbeck L on: 10/10/2023 09:42 AM   Modules accepted: Orders

## 2023-10-17 ENCOUNTER — Ambulatory Visit (HOSPITAL_BASED_OUTPATIENT_CLINIC_OR_DEPARTMENT_OTHER)
Admission: RE | Admit: 2023-10-17 | Discharge: 2023-10-17 | Disposition: A | Discharge status: Home / Self Care | Source: Ambulatory Visit | Attending: Family Medicine | Admitting: Family Medicine

## 2023-10-17 ENCOUNTER — Other Ambulatory Visit (HOSPITAL_COMMUNITY): Payer: Self-pay

## 2023-10-17 DIAGNOSIS — K8020 Calculus of gallbladder without cholecystitis without obstruction: Secondary | ICD-10-CM | POA: Insufficient documentation

## 2023-10-17 DIAGNOSIS — J439 Emphysema, unspecified: Secondary | ICD-10-CM | POA: Insufficient documentation

## 2023-10-17 DIAGNOSIS — E1122 Type 2 diabetes mellitus with diabetic chronic kidney disease: Secondary | ICD-10-CM | POA: Diagnosis not present

## 2023-10-17 DIAGNOSIS — N189 Chronic kidney disease, unspecified: Secondary | ICD-10-CM | POA: Diagnosis not present

## 2023-10-17 DIAGNOSIS — Z122 Encounter for screening for malignant neoplasm of respiratory organs: Secondary | ICD-10-CM | POA: Insufficient documentation

## 2023-10-17 DIAGNOSIS — F17210 Nicotine dependence, cigarettes, uncomplicated: Secondary | ICD-10-CM | POA: Diagnosis not present

## 2023-10-17 DIAGNOSIS — N183 Chronic kidney disease, stage 3 unspecified: Secondary | ICD-10-CM | POA: Insufficient documentation

## 2023-10-17 DIAGNOSIS — I700 Atherosclerosis of aorta: Secondary | ICD-10-CM | POA: Diagnosis not present

## 2023-10-17 DIAGNOSIS — Z87891 Personal history of nicotine dependence: Secondary | ICD-10-CM | POA: Insufficient documentation

## 2023-10-17 DIAGNOSIS — N281 Cyst of kidney, acquired: Secondary | ICD-10-CM | POA: Diagnosis not present

## 2023-10-18 ENCOUNTER — Other Ambulatory Visit: Payer: Self-pay | Admitting: Family

## 2023-10-22 ENCOUNTER — Other Ambulatory Visit: Payer: Self-pay | Admitting: *Deleted

## 2023-10-22 DIAGNOSIS — H5010 Unspecified exotropia: Secondary | ICD-10-CM | POA: Diagnosis not present

## 2023-10-22 DIAGNOSIS — H43813 Vitreous degeneration, bilateral: Secondary | ICD-10-CM | POA: Diagnosis not present

## 2023-10-22 DIAGNOSIS — Z122 Encounter for screening for malignant neoplasm of respiratory organs: Secondary | ICD-10-CM

## 2023-10-22 DIAGNOSIS — H2513 Age-related nuclear cataract, bilateral: Secondary | ICD-10-CM | POA: Diagnosis not present

## 2023-10-22 DIAGNOSIS — E119 Type 2 diabetes mellitus without complications: Secondary | ICD-10-CM | POA: Diagnosis not present

## 2023-10-22 DIAGNOSIS — Z87891 Personal history of nicotine dependence: Secondary | ICD-10-CM

## 2023-10-23 ENCOUNTER — Ambulatory Visit: Payer: Self-pay | Admitting: Family Medicine

## 2023-10-23 ENCOUNTER — Telehealth: Payer: Self-pay

## 2023-10-23 NOTE — Telephone Encounter (Signed)
 PAP: Patient assistance application for [employer]  through [employer]) has been mailed to pt's home address on file. Provider portion of application will be faxed to provider's office. For renewal 2026

## 2023-10-23 NOTE — Patient Instructions (Signed)
 Visit Information  Thank you for taking time to visit with me today. Please don't hesitate to contact me if I can be of assistance to you before our next scheduled appointment.  Your next care management appointment is by telephone on 11/03/23 at 330 pm    Please call the care guide team at [phone] if you need to cancel, schedule, or reschedule an appointment.   Please call the Suicide and Crisis Lifeline: 988 call the [place]  National Suicide Prevention Lifeline: 639-454-8624 or TTY: 8304788312 TTY (320)716-8979) to talk to a trained counselor call 1-800-273-TALK (toll free, 24 hour hotline) call 911 if you are experiencing a Mental Health or Behavioral Health Crisis or need someone to talk to.  Ardelle Haliburton L. Ramonita, RN, BSN, CCM [place]  [facility], [facility] RN Care Manager Direct Dial: [phone]  Fax: [phone]

## 2023-10-26 ENCOUNTER — Other Ambulatory Visit (HOSPITAL_COMMUNITY): Payer: Self-pay

## 2023-10-27 ENCOUNTER — Ambulatory Visit: Attending: Family Medicine

## 2023-10-27 ENCOUNTER — Other Ambulatory Visit: Payer: Self-pay

## 2023-10-27 DIAGNOSIS — M25562 Pain in left knee: Secondary | ICD-10-CM | POA: Insufficient documentation

## 2023-10-27 DIAGNOSIS — R262 Difficulty in walking, not elsewhere classified: Secondary | ICD-10-CM | POA: Diagnosis not present

## 2023-10-27 DIAGNOSIS — M5459 Other low back pain: Secondary | ICD-10-CM | POA: Diagnosis not present

## 2023-10-27 DIAGNOSIS — M25561 Pain in right knee: Secondary | ICD-10-CM | POA: Diagnosis not present

## 2023-10-27 DIAGNOSIS — G8929 Other chronic pain: Secondary | ICD-10-CM | POA: Insufficient documentation

## 2023-10-27 DIAGNOSIS — R293 Abnormal posture: Secondary | ICD-10-CM | POA: Insufficient documentation

## 2023-10-27 NOTE — Therapy (Signed)
 OUTPATIENT PHYSICAL THERAPY LOWER EXTREMITY EVALUATION   Patient Name: Rose Miller MRN: [id number] DOB:1955/09/27, 68 y.o., female Today's Date: 10/27/2023  END OF SESSION:  PT End of Session - 10/27/23 1427     Visit Number 2    Date for Recertification  01/01/24    PT Start Time 1430    PT Stop Time 1515    PT Time Calculation (min) 45 min    Activity Tolerance Patient tolerated treatment well    Behavior During Therapy [facility] for tasks assessed/performed           Past Medical History:  Diagnosis Date   Adhesive capsulitis    Aortic valve disease    Arthritis    Asthma    Back pain    CKD (chronic kidney disease) stage 3, GFR 30-59 ml/min (HCC)    Constipation    COPD (chronic obstructive pulmonary disease) (HCC)    Diabetes mellitus without complication (HCC) 10 20 2020   Diuretic-induced hypokalemia    Heart murmur    Hypercholesterolemia    Hypertension    IT band syndrome    Morbid obesity (HCC)    Palpitations    Periumbilical pain    Postmenopausal estrogen deficiency    Prediabetes    S/P TAVR (transcatheter aortic valve replacement) 12/26/2020   Ventral hernia    Past Surgical History:  Procedure Laterality Date   BIOPSY  05/01/2021   Procedure: BIOPSY;  Surgeon: Charlanne Groom, MD;  Location: [facility];  Service: Gastroenterology;;   CARDIAC CATHETERIZATION  11/24/2020   CESAREAN SECTION  1989   COLONOSCOPY     COLONOSCOPY WITH PROPOFOL  N/A 05/01/2021   Procedure: COLONOSCOPY WITH PROPOFOL ;  Surgeon: Charlanne Groom, MD;  Location: [facility];  Service: Gastroenterology;  Laterality: N/A;   ESOPHAGOGASTRODUODENOSCOPY (EGD) WITH PROPOFOL  N/A 05/01/2021   Procedure: ESOPHAGOGASTRODUODENOSCOPY (EGD) WITH PROPOFOL ;  Surgeon: Charlanne Groom, MD;  Location: [facility];  Service: Gastroenterology;  Laterality: N/A;   INTRAOPERATIVE TRANSTHORACIC ECHOCARDIOGRAM N/A 12/26/2020   Procedure: INTRAOPERATIVE TRANSTHORACIC ECHOCARDIOGRAM;  Surgeon:  Wendel Lurena POUR, MD;  Location: [facility];  Service: Open Heart Surgery;  Laterality: N/A;   MALONEY DILATION  05/01/2021   Procedure: AGAPITO DILATION;  Surgeon: Charlanne Groom, MD;  Location: [facility];  Service: Gastroenterology;;   POLYPECTOMY  05/01/2021   Procedure: POLYPECTOMY;  Surgeon: Charlanne Groom, MD;  Location: [facility];  Service: Gastroenterology;;   RIGHT/LEFT HEART CATH AND CORONARY ANGIOGRAPHY N/A 11/24/2020   Procedure: RIGHT/LEFT HEART CATH AND CORONARY ANGIOGRAPHY;  Surgeon: Claudene Victory ORN, MD;  Location: [facility] INVASIVE CV LAB;  Service: Cardiovascular;  Laterality: N/A;   TONSILLECTOMY AND ADENOIDECTOMY  1960   TRANSCATHETER AORTIC VALVE REPLACEMENT, TRANSFEMORAL N/A 12/26/2020   Procedure: TRANSCATHETER AORTIC VALVE REPLACEMENT, TRANSFEMORAL;  Surgeon: Wendel Lurena POUR, MD;  Location: [facility];  Service: Open Heart Surgery;  Laterality: N/A;   Patient Active Problem List   Diagnosis Date Noted   Acquired hammer toe of right foot 09/26/2023   Atherosclerosis of artery of both lower extremities 09/26/2023   Polyneuropathy due to type 2 diabetes mellitus (HCC) 09/26/2023   Primary localized osteoarthrosis of ankle and foot 09/26/2023   Kidney lesion 04/14/2023   Abscess of lower back 03/17/2023   Ingrown nail of great toe of left foot 12/16/2022   Vitamin D  deficiency 12/16/2022   Low back pain 12/16/2022   Chronic pain of both knees 12/16/2022   Dyspnea on exertion 12/16/2022   Coronary artery disease due  to calcified coronary lesion 12/16/2022   Aortic atherosclerosis 12/16/2022   PAF (paroxysmal atrial fibrillation) (HCC) 03/14/2022   Otalgia of left ear 03/14/2022   Diplopia 09/12/2021   Allergic rhinitis 09/12/2021   Pharyngoesophageal dysphagia    Family history of colon cancer in father    Aortic valve disease 12/26/2020   COPD (chronic obstructive pulmonary disease) (HCC) 12/26/2020   Postmenopausal estrogen deficiency 02/18/2019    Former smoker 05/13/2018   Long-term use of aspirin  therapy 05/13/2018   Peripheral vascular disease 05/05/2018   Prediabetes 05/05/2018   Type 2 diabetes mellitus with obesity 03/31/2017   CKD stage 3 secondary to diabetes (HCC) 03/31/2017   Hypercholesterolemia 06/28/2016   Aortic valve stenosis 03/29/2016   Periumbilical pain 03/29/2016   IT band syndrome 09/11/2014   Right shoulder pain 09/11/2014   Ventral hernia 05/01/2014   Morbid obesity (HCC) 03/17/2014    PCP: Sebastian Righter MD  REFERRING PROVIDER: same  REFERRING DIAG: lower back, B knee, L hip pain  THERAPY DIAG:  Difficulty in walking, not elsewhere classified  Other low back pain  Chronic pain of both knees  Abnormal posture  Rationale for Evaluation and Treatment: Rehabilitation  ONSET DATE: December 2024  SUBJECTIVE:   SUBJECTIVE STATEMENT:   [facility] issues PERTINENT HISTORY: Referred by PCP due to pain complaints knees, hips lower back PAIN:  Are you having pain? Yes: NPRS scale: 0 to6 Pain location: lateral hips B knees primarily lateral jt line Pain description: deep pain with walking Aggravating factors: walking Relieving factors: resting  PRECAUTIONS: Fall  RED FLAGS: None   WEIGHT BEARING RESTRICTIONS: No  FALLS:  Has patient fallen in last 6 months? No  LIVING ENVIRONMENT: Lives with: lives with their son Lives in: House/apartment Stairs: steps outdoors Has following equipment at home: None  OCCUPATION: retired  PLOF: Independent  PATIENT GOALS: be able to walk without pain  NEXT MD VISIT: 1 month  OBJECTIVE:  Note: Objective measures were completed at Evaluation unless otherwise noted.  DIAGNOSTIC FINDINGS: na  PATIENT SURVEYS:  Lower Extremity Functional Score: 35 / 80 = 43.8 %  COGNITION: Overall cognitive status: Within functional limits for tasks assessed     SENSATION: WFL    MUSCLE  LENGTH: Hamstrings: Right wfl deg; Left wfl deg Thomas test: Right nt deg; Left  nt deg  POSTURE: marked valgum B knees, greater on R  PALPATION: Tender L lateral hip musculature, TFL Tender R glut medius, minimus,  LOWER EXTREMITY ROM:  Passive ROM Right eval Left eval  Hip flexion 105 95  Hip extension    Hip abduction    Hip adduction    Hip internal rotation    Hip external rotation    Knee flexion 112 102  Knee extension -3 -12  Ankle dorsiflexion    Ankle plantarflexion    Ankle inversion    Ankle eversion     (Blank rows = not tested)  LOWER EXTREMITY MMT:  MMT Right eval Left eval  Hip flexion 4- 4-  Hip extension    Hip abduction 3+ 3-  Hip adduction    Hip internal rotation    Hip external rotation    Knee flexion    Knee extension    Ankle dorsiflexion    Ankle plantarflexion 4- 4-  Ankle inversion    Ankle eversion     (Blank rows = not tested)   FUNCTIONAL TESTS:  Timed up and  go (TUG): 18.35  GAIT: Distance walked: 64' in clinic, no device, labored gait, slowed cadence decreased stride length B                                                                                                                              TREATMENT DATE:  10/27/23:  Knee ext B,10 # 3 x 10 Knee flex B 35# 3 x 10 In ll bars for heel/toe rocks on airex 15 x In ll bars with blue t band around thighs, for alt hip abd 15 x In ll bars, blue t band around thighs for alt SLR, opp hip ext ,rocking forward and back Nustep L 5 x 6 min x 4 exts Side lying for deep cross friction massage L lateral hip over TFL, glut medius with theragun Supine with 65 cm physioball under feet for B knee to chest, LTR, bridging all 10 x  Seated for brief assessment of R shoulder, B shoulder flexion limited, R to 90, L to 110 B shoulder IR/ER wnl MMT B shoulders with pain and weakness R ER, B shoulder flex and abd   10/08/23:  Eval:  side lying on each side for theragun each hip lateral  musculature with improved Sx.  PATIENT EDUCATION:  Education details: POC, goals Person educated: Patient and Child(ren) Education method: Explanation Education comprehension: verbalized understanding  HOME EXERCISE PROGRAM: TBD  ASSESSMENT:  CLINICAL IMPRESSION: 10/27/23: returned today for initial treatment session to address multiple LE jt stiffness and pain, endurance activity tolerance.  Introduced several strengthening activities , as well as briefly screened R shoulder pain. She should benefit from instruction in light rotator cuff strengthening next visit, as well as continuation of the lower extremity functional strength and flexibility.  She will attend aquatic PT after her second session this week. Eval: Patient is a 68 y.o. female who was evaluated today for physical therapy due to chronic pain multiple jts. Presents with postural changes remarkably B knee valgum, also weakness B hips, decreased walking tolerance.  Would benefit from PT with combination of manual techniques, therex, modalities to improve sx and work with her [facility] We discussed aquatic therapy due to multiple jt involvement and she is very interested in pursuing this aspect of PT  OBJECTIVE IMPAIRMENTS: cardiopulmonary status limiting activity, decreased activity tolerance, decreased balance, decreased endurance, decreased knowledge of condition, decreased mobility, difficulty walking, decreased ROM, decreased strength, increased fascial restrictions, impaired perceived functional ability, postural dysfunction, and pain.   ACTIVITY LIMITATIONS: carrying, standing, squatting, stairs, transfers, and locomotion level  PARTICIPATION LIMITATIONS: cleaning, laundry, shopping, community activity, and yard work  PERSONAL FACTORS: Behavior pattern, Time since onset of injury/illness/exacerbation, and 1-2 comorbidities: COPD are also affecting patient's functional outcome.   REHAB  POTENTIAL: Good  CLINICAL DECISION MAKING: Evolving/moderate complexity  EVALUATION COMPLEXITY: Moderate   GOALS: Goals reviewed with patient? Yes  SHORT TERM GOALS: Target date: 2 weeks,  10/22/23 I HEP Baseline: Goal status: INITIAL   LONG TERM GOALS: Target date: 01/01/24, 12 weeks  LEFSLower Extremity Functional Score: 35 / 80 = 43.8 % improve to 30%or less disability Baseline:  Goal status: INITIAL  2.  TUG improve from 18 to 14 or less Baseline:  Goal status: INITIAL  3.  Strength B hip abd, ext 5/5 Baseline: 3+ and 4-/5 Goal status: INITIAL     PLAN:  PT FREQUENCY: 2x/week  PT DURATION: 12 weeks  PLANNED INTERVENTIONS: 97110-Therapeutic exercises, 97530- Therapeutic activity, 97112- Neuromuscular re-education, 97535- Self Care, 02859- Manual therapy, [phone]- Aquatic Therapy, and Patient/Family education  PLAN FOR NEXT SESSION: cardiovascular equipment, soft tissue treatment B lat hips, therex to engage LE musculature    Haylen Shelnutt L Wladyslawa Disbro, PT,DPT, OCS 10/27/2023, 4:06 PM

## 2023-10-28 NOTE — Telephone Encounter (Signed)
 Received provider portion Pap application Bi ([employer] )

## 2023-10-29 ENCOUNTER — Other Ambulatory Visit: Payer: Self-pay

## 2023-10-29 ENCOUNTER — Ambulatory Visit

## 2023-10-29 DIAGNOSIS — R262 Difficulty in walking, not elsewhere classified: Secondary | ICD-10-CM | POA: Diagnosis not present

## 2023-10-29 DIAGNOSIS — M5459 Other low back pain: Secondary | ICD-10-CM

## 2023-10-29 DIAGNOSIS — R293 Abnormal posture: Secondary | ICD-10-CM

## 2023-10-29 DIAGNOSIS — G8929 Other chronic pain: Secondary | ICD-10-CM

## 2023-10-29 NOTE — Therapy (Signed)
 OUTPATIENT PHYSICAL THERAPY LOWER EXTREMITY TREATMENT   Patient Name: Rose Miller MRN: [id number] DOB:11-29-1955, 68 y.o., female Today's Date: 10/29/2023  END OF SESSION:  PT End of Session - 10/29/23 1301     Visit Number 3    Date for Recertification  01/01/24    PT Start Time 1300    PT Stop Time 1345    PT Time Calculation (min) 45 min    Activity Tolerance Patient tolerated treatment well    Behavior During Therapy [facility] for tasks assessed/performed            Past Medical History:  Diagnosis Date   Adhesive capsulitis    Aortic valve disease    Arthritis    Asthma    Back pain    CKD (chronic kidney disease) stage 3, GFR 30-59 ml/min (HCC)    Constipation    COPD (chronic obstructive pulmonary disease) (HCC)    Diabetes mellitus without complication (HCC) 10 20 2020   Diuretic-induced hypokalemia    Heart murmur    Hypercholesterolemia    Hypertension    IT band syndrome    Morbid obesity (HCC)    Palpitations    Periumbilical pain    Postmenopausal estrogen deficiency    Prediabetes    S/P TAVR (transcatheter aortic valve replacement) 12/26/2020   Ventral hernia    Past Surgical History:  Procedure Laterality Date   BIOPSY  05/01/2021   Procedure: BIOPSY;  Surgeon: Charlanne Groom, MD;  Location: [facility];  Service: Gastroenterology;;   CARDIAC CATHETERIZATION  11/24/2020   CESAREAN SECTION  1989   COLONOSCOPY     COLONOSCOPY WITH PROPOFOL  N/A 05/01/2021   Procedure: COLONOSCOPY WITH PROPOFOL ;  Surgeon: Charlanne Groom, MD;  Location: [facility];  Service: Gastroenterology;  Laterality: N/A;   ESOPHAGOGASTRODUODENOSCOPY (EGD) WITH PROPOFOL  N/A 05/01/2021   Procedure: ESOPHAGOGASTRODUODENOSCOPY (EGD) WITH PROPOFOL ;  Surgeon: Charlanne Groom, MD;  Location: [facility];  Service: Gastroenterology;  Laterality: N/A;   INTRAOPERATIVE TRANSTHORACIC ECHOCARDIOGRAM N/A 12/26/2020   Procedure: INTRAOPERATIVE TRANSTHORACIC ECHOCARDIOGRAM;  Surgeon:  Wendel Lurena POUR, MD;  [facility]: [facility];  Service: Open Heart Surgery;  Laterality: N/A;   MALONEY DILATION  05/01/2021   Procedure: AGAPITO DILATION;  Surgeon: Charlanne Groom, MD;  Location: [facility];  Service: Gastroenterology;;   POLYPECTOMY  05/01/2021   Procedure: POLYPECTOMY;  Surgeon: Charlanne Groom, MD;  Location: [facility];  Service: Gastroenterology;;   RIGHT/LEFT HEART CATH AND CORONARY ANGIOGRAPHY N/A 11/24/2020   Procedure: RIGHT/LEFT HEART CATH AND CORONARY ANGIOGRAPHY;  Surgeon: Claudene Victory ORN, MD;  Location: [facility] INVASIVE CV LAB;  Service: Cardiovascular;  Laterality: N/A;   TONSILLECTOMY AND ADENOIDECTOMY  1960   TRANSCATHETER AORTIC VALVE REPLACEMENT, TRANSFEMORAL N/A 12/26/2020   Procedure: TRANSCATHETER AORTIC VALVE REPLACEMENT, TRANSFEMORAL;  Surgeon: Wendel Lurena POUR, MD;  [facility];  Service: Open Heart Surgery;  Laterality: N/A;   Patient Active Problem List   Diagnosis Date Noted   Acquired hammer toe of right foot 09/26/2023   Atherosclerosis of artery of both lower extremities 09/26/2023   Polyneuropathy due to type 2 diabetes mellitus (HCC) 09/26/2023   Primary localized osteoarthrosis of ankle and foot 09/26/2023   Kidney lesion 04/14/2023   Abscess of lower back 03/17/2023   Ingrown nail of great toe of left foot 12/16/2022   Vitamin D  deficiency 12/16/2022   Low back pain 12/16/2022   Chronic pain of both knees 12/16/2022   Dyspnea on exertion 12/16/2022   Coronary artery disease  due to calcified coronary lesion 12/16/2022   Aortic atherosclerosis 12/16/2022   PAF (paroxysmal atrial fibrillation) (HCC) 03/14/2022   Otalgia of left ear 03/14/2022   Diplopia 09/12/2021   Allergic rhinitis 09/12/2021   Pharyngoesophageal dysphagia    Family history of colon cancer in father    Aortic valve disease 12/26/2020   COPD (chronic obstructive pulmonary disease) (HCC) 12/26/2020   Postmenopausal estrogen deficiency 02/18/2019    Former smoker 05/13/2018   Long-term use of aspirin  therapy 05/13/2018   Peripheral vascular disease 05/05/2018   Prediabetes 05/05/2018   Type 2 diabetes mellitus with obesity 03/31/2017   CKD stage 3 secondary to diabetes (HCC) 03/31/2017   Hypercholesterolemia 06/28/2016   Aortic valve stenosis 03/29/2016   Periumbilical pain 03/29/2016   IT band syndrome 09/11/2014   Right shoulder pain 09/11/2014   Ventral hernia 05/01/2014   Morbid obesity (HCC) 03/17/2014    PCP: Sebastian Righter MD  REFERRING PROVIDER: same  REFERRING DIAG: lower back, B knee, L hip pain  THERAPY DIAG:  Difficulty in walking, not elsewhere classified  Other low back pain  Chronic pain of both knees  Abnormal posture  Rationale for Evaluation and Treatment: Rehabilitation  ONSET DATE: December 2024  SUBJECTIVE:   SUBJECTIVE STATEMENT:   Sore after last session but as expected, not too badly PERTINENT HISTORY: Referred by PCP due to pain complaints knees, hips lower back PAIN:  Are you having pain? Yes: NPRS scale: 0 to6 Pain location: lateral hips B knees primarily lateral jt line Pain description: deep pain with walking Aggravating factors: walking Relieving factors: resting  PRECAUTIONS: Fall  RED FLAGS: None   WEIGHT BEARING RESTRICTIONS: No  FALLS:  Has patient fallen in last 6 months? No  LIVING ENVIRONMENT: Lives with: lives with their son Lives in: House/apartment Stairs: steps outdoors Has following equipment at home: None  OCCUPATION: retired  PLOF: Independent  PATIENT GOALS: be able to walk without pain  NEXT MD VISIT: 1 month  OBJECTIVE:  Note: Objective measures were completed at Evaluation unless otherwise noted.  DIAGNOSTIC FINDINGS: na  PATIENT SURVEYS:  Lower Extremity Functional Score: 35 / 80 = 43.8 %  COGNITION: Overall cognitive status: Within functional limits for tasks assessed     SENSATION: WFL    MUSCLE LENGTH: Hamstrings: Right  wfl deg; Left wfl deg Thomas test: Right nt deg; Left  nt deg  POSTURE: marked valgum B knees, greater on R  PALPATION: Tender L lateral hip musculature, TFL Tender R glut medius, minimus,  LOWER EXTREMITY ROM:  Passive ROM Right eval Left eval  Hip flexion 105 95  Hip extension    Hip abduction    Hip adduction    Hip internal rotation    Hip external rotation    Knee flexion 112 102  Knee extension -3 -12  Ankle dorsiflexion    Ankle plantarflexion    Ankle inversion    Ankle eversion     (Blank rows = not tested)  LOWER EXTREMITY MMT:  MMT Right eval Left eval  Hip flexion 4- 4-  Hip extension    Hip abduction 3+ 3-  Hip adduction    Hip internal rotation    Hip external rotation    Knee flexion    Knee extension    Ankle dorsiflexion    Ankle plantarflexion 4- 4-  Ankle inversion    Ankle eversion     (Blank rows = not tested)   FUNCTIONAL TESTS:  Timed up and go (TUG):  18.35  GAIT: Distance walked: 72' in clinic, no device, labored gait, slowed cadence decreased stride length B                                                                                                                              TREATMENT DATE:  10/29/23:  Nustep L 5 x 7 min x 4 exts  Yellow t band B shoulder ER  Yellow t  band rows band under feet B In ll bars with blue t band around thighs, for alt hip abd 15 x In ll bars, blue t band around thighs for alt SLR, opp hip ext ,rocking forward and back Knee flexion B 35# 3 x 10  Knee ext 10# 3 x 10 Supine for 65 cm ball , B knee to chest, LTR, alt SLR, bridging all 15x Side lying R for deep cross friction massage L lateral hip musculature with theragun   10/27/23:  Knee ext B,10 # 3 x 10 Knee flex B 35# 3 x 10 In ll bars for heel/toe rocks on airex 15 x In ll bars with blue t band around thighs, for alt hip abd 15 x In ll bars, blue t band around thighs for alt SLR, opp hip ext ,rocking forward and back Nustep L 5 x 6  min x 4 exts Side lying for deep cross friction massage L lateral hip over TFL, glut medius with theragun Supine with 65 cm physioball under feet for B knee to chest, LTR, bridging all 10 x  Seated for brief assessment of R shoulder, B shoulder flexion limited, R to 90, L to 110 B shoulder IR/ER wnl MMT B shoulders with pain and weakness R ER, B shoulder flex and abd   10/08/23:  Eval:  side lying on each side for theragun each hip lateral musculature with improved Sx.  PATIENT EDUCATION:  Education details: POC, goals Person educated: Patient and Child(ren) Education method: Explanation Education comprehension: verbalized understanding  HOME EXERCISE PROGRAM: TBD  ASSESSMENT:  CLINICAL IMPRESSION: 10/29/23: returned today for second treatment session to address multiple LE jt stiffness and pain, endurance activity tolerance.  Introduced several strengthening activities , as well as briefly screened R shoulder pain. Added light rotator cuff engagement ex as well today.   She will attend aquatic PT next week.tolerated all activities well today. Eval: Patient is a 68 y.o. female who was evaluated today for physical therapy due to chronic pain multiple jts. Presents with postural changes remarkably B knee valgum, also weakness B hips, decreased walking tolerance.  Would benefit from PT with combination of manual techniques, therex, modalities to improve sx and work with her [facility] We discussed aquatic therapy due to multiple jt involvement and she is very interested in pursuing this aspect of PT  OBJECTIVE IMPAIRMENTS: cardiopulmonary status limiting activity, decreased activity tolerance, decreased balance, decreased endurance, decreased knowledge of condition, decreased mobility, difficulty  walking, decreased ROM, decreased strength, increased fascial restrictions, impaired perceived functional ability, postural dysfunction, and pain.   ACTIVITY  LIMITATIONS: carrying, standing, squatting, stairs, transfers, and locomotion level  PARTICIPATION LIMITATIONS: cleaning, laundry, shopping, community activity, and yard work  PERSONAL FACTORS: Behavior pattern, Time since onset of injury/illness/exacerbation, and 1-2 comorbidities: COPD are also affecting patient's functional outcome.   REHAB POTENTIAL: Good  CLINICAL DECISION MAKING: Evolving/moderate complexity  EVALUATION COMPLEXITY: Moderate   GOALS: Goals reviewed with patient? Yes  SHORT TERM GOALS: Target date: 2 weeks, 10/22/23 I HEP Baseline: Goal status: INITIAL   LONG TERM GOALS: Target date: 01/01/24, 12 weeks  LEFSLower Extremity Functional Score: 35 / 80 = 43.8 % improve to 30%or less disability Baseline:  Goal status: INITIAL  2.  TUG improve from 18 to 14 or less Baseline:  Goal status: INITIAL  3.  Strength B hip abd, ext 5/5 Baseline: 3+ and 4-/5 Goal status: INITIAL     PLAN:  PT FREQUENCY: 2x/week  PT DURATION: 12 weeks  PLANNED INTERVENTIONS: 97110-Therapeutic exercises, 97530- Therapeutic activity, [id number]- Neuromuscular re-education, 97535- Self Care, 02859- Manual therapy, [phone]- Aquatic Therapy, and Patient/Family education  PLAN FOR NEXT SESSION:to attend first session of aquatic PT next week.   Jaysten Essner LITTIE Credit, PT,DPT, OCS 10/29/2023, 1:49 PM [facility] Outpatient Rehabilitation at [facility]. [place], [place], [address] Phone: [phone]   Fax:  [phone]  Patient Details  Name: KAMORIE ALDOUS MRN: [id number] Date of Birth: 1955/03/05 Referring Provider:  Sebastian Beverley NOVAK, MD  Encounter Date: 10/29/2023   Greig LITTIE Credit, PT 10/29/2023, 1:49 PM  [facility] Outpatient Rehabilitation at [facility]. [place], [place], [address] Phone: [phone]   Fax:  [phone]

## 2023-11-03 ENCOUNTER — Other Ambulatory Visit: Payer: Self-pay | Admitting: *Deleted

## 2023-11-03 ENCOUNTER — Ambulatory Visit

## 2023-11-03 NOTE — Patient Outreach (Signed)
 Complex Care Management   Visit Note  11/03/2023  Name:  Rose Miller MRN: [id number] DOB: 1955/06/02  Situation: Referral received for Complex Care Management related to Diabetes with Complications, Atrial Fibrillation, and HTN I obtained verbal consent from Patient.  Visit completed with Patient  on the phone  Background:   Past Medical History:  Diagnosis Date   Adhesive capsulitis    Aortic valve disease    Arthritis    Asthma    Back pain    CKD (chronic kidney disease) stage 3, GFR 30-59 ml/min (HCC)    Constipation    COPD (chronic obstructive pulmonary disease) (HCC)    Diabetes mellitus without complication (HCC) 10 20 2020   Diuretic-induced hypokalemia    Heart murmur    Hypercholesterolemia    Hypertension    IT band syndrome    Morbid obesity (HCC)    Palpitations    Periumbilical pain    Postmenopausal estrogen deficiency    Prediabetes    S/P TAVR (transcatheter aortic valve replacement) 12/26/2020   Ventral hernia     Assessment: Patient Reported Symptoms:  Cognitive Cognitive Status: No symptoms reported   Health Maintenance Behaviors: Annual physical exam Healing Pattern: Average  Neurological Neurological Review of Symptoms: No symptoms reported    HEENT        Cardiovascular Cardiovascular Symptoms Reported: Swelling in legs or feet Does patient have uncontrolled Hypertension?: No Is patient checking Blood Pressure at home?: Yes Patient's Recent BP reading at home: 126/68 Cardiovascular Management Strategies: Routine screening  Respiratory Respiratory Symptoms Reported: No symptoms reported Respiratory Self-Management Outcome: 4 (good)  Endocrine Is patient diabetic?: Yes Is patient checking blood sugars at home?: Yes List most recent blood sugar readings, include date and time of day: 11-03-23 at 1300 CBG 96 Endocrine Self-Management Outcome: 4 (good)  Gastrointestinal Gastrointestinal Symptoms Reported: No symptoms  reported Gastrointestinal Self-Management Outcome: 4 (good)    Genitourinary Genitourinary Symptoms Reported: No symptoms reported    Integumentary Integumentary Symptoms Reported: No symptoms reported    Musculoskeletal Musculoskelatal Symptoms Reviewed: Joint pain, Muscle pain Musculoskeletal Management Strategies: Routine screening Falls in the past year?: No Number of falls in past year: 1 or less Was there an injury with Fall?: No Fall Risk Category Calculator: 0 Patient Fall Risk Level: Low Fall Risk Patient at Risk for Falls Due to: No Fall Risks Fall risk Follow up: Falls evaluation completed  Psychosocial Psychosocial Symptoms Reported: No symptoms reported Behavioral Management Strategies: Coping strategies Behavioral Health Self-Management Outcome: 4 (good) Major Change/Loss/Stressor/Fears (CP): Denies Techniques to Cope with Loss/Stress/Change: Diversional activities Quality of Family Relationships: unable to assess Do you feel physically threatened by others?: No    11/03/2023    PHQ2-9 Depression Screening   Little interest or pleasure in doing things Not at all  Feeling down, depressed, or hopeless Not at all  PHQ-2 - Total Score 0  Trouble falling or staying asleep, or sleeping too much    Feeling tired or having little energy    Poor appetite or overeating     Feeling bad about yourself - or that you are a failure or have let yourself or your family down    Trouble concentrating on things, such as reading the newspaper or watching television    Moving or speaking so slowly that other people could have noticed.  Or the opposite - being so fidgety or restless that you have been moving around a lot more than usual    Thoughts that  you would be better off dead, or hurting yourself in some way    PHQ2-9 Total Score    If you checked off any problems, how difficult have these problems made it for you to do your work, take care of things at home, or get along with  other people    Depression Interventions/Treatment      Vitals:   11/03/23 1547  BP: 126/68    Medications Reviewed Today     Reviewed by Bertrum Rosina HERO, RN (Registered Nurse) on 11/03/23 at 1540  Med List Status: <None>   Medication Order Taking? Sig Documenting Provider Last Dose Status Informant  albuterol  (VENTOLIN  HFA) 108 (90 Base) MCG/ACT inhaler [id number] Yes Inhale 2 puffs into the lungs every 6 (six) hours as needed for wheezing or shortness of breath. Neda Jennet LABOR, MD  Active   amLODipine  (NORVASC ) 10 MG tablet [id number] Yes Take 1 tablet (10 mg total) by mouth daily. Sebastian Beverley NOVAK, MD  Active   amoxicillin  (AMOXIL ) 500 MG capsule [id number] Yes take 4 capsules one hour prior to dental procedures and cleanings  Patient taking differently: as needed. take 4 capsules one hour prior to dental procedures and cleanings   Sebastian Beverley NOVAK, MD  Active            Med Note DANICE, LISA   Tue Jun 24, 2023  2:19 PM) Prescribed for dental exam in October 2025  apixaban  (ELIQUIS ) 5 MG TABS tablet [id number] Yes Take 1 tablet (5 mg total) by mouth 2 (two) times daily. Sebastian Beverley NOVAK, MD  Active   Ascorbic Acid (VITAMIN C) 1000 MG tablet [id number] Yes Take 1,000 mg by mouth daily. [provider]  Active Self  atorvastatin  (LIPITOR) 10 MG tablet [id number] Yes Take 1 tablet (10 mg total) by mouth daily. Sebastian Beverley NOVAK, MD  Active   azelastine  (ASTELIN ) 0.1 % nasal spray [id number] Yes Place 2 sprays into both nostrils 2 (two) times daily. Use in each nostril as directed Hedges, Reyes, PA-C  Active   Blood Glucose Monitoring Suppl (ONE TOUCH ULTRA 2) w/Device KIT [id number] Yes Use as directed in the morning, at noon, and at bedtime. Sebastian Beverley NOVAK, MD  Active   Blood Glucose Monitoring Suppl (ONE TOUCH ULTRA 2) w/Device KIT [id number] Yes Use to check blood glucose in the morning, noon and bedtime. Sebastian Beverley NOVAK, MD  Active   BOOSTRIX 5-2.5-18.5 LF-MCG/0.5  injection [id number]    Patient not taking: Reported on 08/25/2023   [provider]  Active   Cholecalciferol 50 MCG (2000 UT) TABS [id number] Yes Take 2,000 Units by mouth daily. [provider]  Active Self  Coenzyme Q10 (CO Q-10) 100 MG CAPS [id number] Yes Take 100 mg by mouth daily.  [provider]  Active Self  Cyanocobalamin (B-12) 500 MCG TABS [id number] Yes Take 500 mcg by mouth daily. [provider]  Active Self  diltiazem  (CARDIZEM ) 30 MG tablet [id number] Yes Take 1 tablet (30 mg total) by mouth every 6 (six) hours as needed (fast heart rate, heart palpitations). Claudene Victory ORN, MD  Active            Med Note [place]ACK[place], [place] R   Thu Apr 10, 2023 10:54 AM)    empagliflozin  (JARDIANCE ) 10 MG TABS tablet [id number] Yes Take 1 tablet (10 mg total) by mouth daily before breakfast. Berneta Elsie Sayre, MD  Active            Med Note [place], [place]  R   Thu Apr 10, 2023 10:54 AM)    ferrous sulfate 325 (65 FE) MG tablet [id number] Yes  [provider]  Active   fluticasone  (FLONASE ) 50 MCG/ACT nasal spray [id number] Yes Place 2 sprays into both nostrils daily. Webb, Padonda B, FNP  Active   furosemide  (LASIX ) 20 MG tablet [id number] Yes Take 1 tablet (20 mg total) by mouth daily as needed (for lower extremity swelling). Vannie Reche RAMAN, NP  Active   glucose blood (ONETOUCH ULTRA TEST) test strip [id number] Yes Use to check blood sugar up to three times daily Sebastian Beverley NOVAK, MD  Active   hydrALAZINE  (APRESOLINE ) 50 MG tablet [id number] Yes Take 1 tablet (50 mg total) by mouth 3 (three) times daily. Raford Riggs, MD  Active   ipratropium (ATROVENT ) 0.06 % nasal spray [id number] Yes Place 2 sprays into both nostrils 4 (four) times daily. Lorin Norris, MD  Active   Lancets [facility] DELICA PLUS [place]) [place] [id number] Yes 1 each by Does not apply route in the morning, at noon, and at bedtime. Sebastian Beverley NOVAK, MD  Active   losartan  (COZAAR )  100 MG tablet [id number] Yes Take 1 tablet (100 mg total) by mouth daily. Discontinue losartan benson Raford Riggs, MD  Active   Menthol, Topical Analgesic, (BLUE-EMU MAXIMUM STRENGTH) 2.5 % LIQD [id number] Yes Apply 1 application topically daily as needed (pain). [provider]  Active Self  metoprolol  succinate (TOPROL -XL) 25 MG 24 hr tablet [id number] Yes Take 1 tablet (25 mg total) by mouth daily. Sebastian Beverley NOVAK, MD  Active   Olopatadine  HCl 0.2 % SOLN [id number] Yes Apply 1 drop to eye in the morning and at bedtime. Lorin Norris, MD  Active   omeprazole  (PRILOSEC ) 20 MG capsule [id number] Yes Take 1 capsule (20 mg total) by mouth daily. Charlanne Groom, MD  Active   Semaglutide , 2 MG/DOSE, 8 MG/3ML SOPN [id number] Yes Inject 2 mg as directed once a week. Sebastian Beverley NOVAK, MD  Active   spironolactone  (ALDACTONE ) 25 MG tablet [id number] Yes Take 1 tablet (25 mg total) by mouth daily. Raford Riggs, MD  Active   Tiotropium Bromide-Olodaterol (STIOLTO RESPIMAT ) 2.5-2.5 MCG/ACT AERS [id number] Yes Inhale 2 puffs into the lungs daily. Neda Jennet LABOR, MD  Active   Zinc 50 MG TABS [id number] Yes Take 50 mg by mouth daily. [provider]  Active Self            Recommendation:   Continue Current Plan of Care  Follow Up Plan:   Telephone follow-up in 1 month  Rosina Forte, BSN RN [facility], [facility] RN Care Manager Direct Dial: [phone]  Fax: [phone]

## 2023-11-03 NOTE — Patient Instructions (Signed)
 Visit Information  Thank you for taking time to visit with me today. Please don't hesitate to contact me if I can be of assistance to you before our next scheduled appointment.  Your next care management appointment is by telephone on 12-04-2023 at 2:30 pm  Telephone follow-up in 1 month  Please call the care guide team at [phone] if you need to cancel, schedule, or reschedule an appointment.   Please call the Suicide and Crisis Lifeline: 988 call the [place]  National Suicide Prevention Lifeline: 708-810-8054 or TTY: 714-729-2214 TTY 903-453-1154) to talk to a trained counselor call 1-800-273-TALK (toll free, 24 hour hotline) if you are experiencing a Mental Health or Behavioral Health Crisis or need someone to talk to.  Rosina Forte, BSN RN [facility], [facility] Health RN Care Manager Direct Dial: [phone]  Fax: [phone]

## 2023-11-05 ENCOUNTER — Ambulatory Visit (HOSPITAL_BASED_OUTPATIENT_CLINIC_OR_DEPARTMENT_OTHER): Attending: Family Medicine | Admitting: Physical Therapy

## 2023-11-05 ENCOUNTER — Ambulatory Visit

## 2023-11-05 ENCOUNTER — Encounter (HOSPITAL_BASED_OUTPATIENT_CLINIC_OR_DEPARTMENT_OTHER): Payer: Self-pay | Admitting: Physical Therapy

## 2023-11-05 DIAGNOSIS — M5459 Other low back pain: Secondary | ICD-10-CM | POA: Diagnosis not present

## 2023-11-05 DIAGNOSIS — R293 Abnormal posture: Secondary | ICD-10-CM | POA: Insufficient documentation

## 2023-11-05 DIAGNOSIS — R262 Difficulty in walking, not elsewhere classified: Secondary | ICD-10-CM | POA: Insufficient documentation

## 2023-11-05 DIAGNOSIS — M25562 Pain in left knee: Secondary | ICD-10-CM | POA: Insufficient documentation

## 2023-11-05 DIAGNOSIS — M25561 Pain in right knee: Secondary | ICD-10-CM | POA: Diagnosis not present

## 2023-11-05 DIAGNOSIS — G8929 Other chronic pain: Secondary | ICD-10-CM | POA: Diagnosis not present

## 2023-11-05 NOTE — Therapy (Signed)
 OUTPATIENT PHYSICAL THERAPY LOWER EXTREMITY TREATMENT   Patient Name: Rose Miller MRN: [id number] DOB:1956/01/06, 68 y.o., female Today's Date: 11/05/2023  END OF SESSION:  PT End of Session - 11/05/23 0844     Visit Number 4    Date for Recertification  01/01/24    PT Start Time 0843    PT Stop Time 0923    PT Time Calculation (min) 40 min    Activity Tolerance Patient tolerated treatment well    Behavior During Therapy [facility] for tasks assessed/performed            Past Medical History:  Diagnosis Date   Adhesive capsulitis    Aortic valve disease    Arthritis    Asthma    Back pain    CKD (chronic kidney disease) stage 3, GFR 30-59 ml/min (HCC)    Constipation    COPD (chronic obstructive pulmonary disease) (HCC)    Diabetes mellitus without complication (HCC) 10 20 2020   Diuretic-induced hypokalemia    Heart murmur    Hypercholesterolemia    Hypertension    IT band syndrome    Morbid obesity (HCC)    Palpitations    Periumbilical pain    Postmenopausal estrogen deficiency    Prediabetes    S/P TAVR (transcatheter aortic valve replacement) 12/26/2020   Ventral hernia    Past Surgical History:  Procedure Laterality Date   BIOPSY  05/01/2021   Procedure: BIOPSY;  Surgeon: Charlanne Groom, MD;  Location: [facility];  Service: Gastroenterology;;   CARDIAC CATHETERIZATION  11/24/2020   CESAREAN SECTION  1989   COLONOSCOPY     COLONOSCOPY WITH PROPOFOL  N/A 05/01/2021   Procedure: COLONOSCOPY WITH PROPOFOL ;  Surgeon: Charlanne Groom, MD;  Location: [facility];  Service: Gastroenterology;  Laterality: N/A;   ESOPHAGOGASTRODUODENOSCOPY (EGD) WITH PROPOFOL  N/A 05/01/2021   Procedure: ESOPHAGOGASTRODUODENOSCOPY (EGD) WITH PROPOFOL ;  Surgeon: Charlanne Groom, MD;  Location: [facility];  Service: Gastroenterology;  Laterality: N/A;   INTRAOPERATIVE TRANSTHORACIC ECHOCARDIOGRAM N/A 12/26/2020   Procedure: INTRAOPERATIVE TRANSTHORACIC ECHOCARDIOGRAM;  Surgeon:  Wendel Lurena POUR, MD;  Location: [facility];  Service: Open Heart Surgery;  Laterality: N/A;   MALONEY DILATION  05/01/2021   Procedure: AGAPITO DILATION;  Surgeon: Charlanne Groom, MD;  Location: [facility];  Service: Gastroenterology;;   POLYPECTOMY  05/01/2021   Procedure: POLYPECTOMY;  Surgeon: Charlanne Groom, MD;  Location: [facility];  Service: Gastroenterology;;   RIGHT/LEFT HEART CATH AND CORONARY ANGIOGRAPHY N/A 11/24/2020   Procedure: RIGHT/LEFT HEART CATH AND CORONARY ANGIOGRAPHY;  Surgeon: Claudene Victory ORN, MD;  Location: [facility] INVASIVE CV LAB;  Service: Cardiovascular;  Laterality: N/A;   TONSILLECTOMY AND ADENOIDECTOMY  1960   TRANSCATHETER AORTIC VALVE REPLACEMENT, TRANSFEMORAL N/A 12/26/2020   Procedure: TRANSCATHETER AORTIC VALVE REPLACEMENT, TRANSFEMORAL;  Surgeon: Wendel Lurena POUR, MD;  Location: [facility];  Service: Open Heart Surgery;  Laterality: N/A;   Patient Active Problem List   Diagnosis Date Noted   Acquired hammer toe of right foot 09/26/2023   Atherosclerosis of artery of both lower extremities 09/26/2023   Polyneuropathy due to type 2 diabetes mellitus (HCC) 09/26/2023   Primary localized osteoarthrosis of ankle and foot 09/26/2023   Kidney lesion 04/14/2023   Abscess of lower back 03/17/2023   Ingrown nail of great toe of left foot 12/16/2022   Vitamin D  deficiency 12/16/2022   Low back pain 12/16/2022   Chronic pain of both knees 12/16/2022   Dyspnea on exertion 12/16/2022   Coronary artery disease  due to calcified coronary lesion 12/16/2022   Aortic atherosclerosis 12/16/2022   PAF (paroxysmal atrial fibrillation) (HCC) 03/14/2022   Otalgia of left ear 03/14/2022   Diplopia 09/12/2021   Allergic rhinitis 09/12/2021   Pharyngoesophageal dysphagia    Family history of colon cancer in father    Aortic valve disease 12/26/2020   COPD (chronic obstructive pulmonary disease) (HCC) 12/26/2020   Postmenopausal estrogen deficiency 02/18/2019    Former smoker 05/13/2018   Long-term use of aspirin  therapy 05/13/2018   Peripheral vascular disease 05/05/2018   Prediabetes 05/05/2018   Type 2 diabetes mellitus with obesity 03/31/2017   CKD stage 3 secondary to diabetes (HCC) 03/31/2017   Hypercholesterolemia 06/28/2016   Aortic valve stenosis 03/29/2016   Periumbilical pain 03/29/2016   IT band syndrome 09/11/2014   Right shoulder pain 09/11/2014   Ventral hernia 05/01/2014   Morbid obesity (HCC) 03/17/2014    PCP: Sebastian Righter MD  REFERRING PROVIDER: same  REFERRING DIAG: lower back, B knee, L hip pain  THERAPY DIAG:  Difficulty in walking, not elsewhere classified  Other low back pain  Chronic pain of both knees  Abnormal posture  Rationale for Evaluation and Treatment: Rehabilitation  ONSET DATE: December 2024  SUBJECTIVE:   SUBJECTIVE STATEMENT:   Sore after last session but as expected, not too bad.   Pt reports she is not afraid of water, but cannot swim.  Hasn't been in pool in a long time.   POOL ACCESS:  Pt is member of [employer].    PERTINENT HISTORY: Referred by PCP due to pain complaints knees, hips lower back PAIN:  Are you having pain? Yes: NPRS scale: 0 to6 Pain location: lateral hips B knees primarily lateral jt line Pain description: deep pain with walking Aggravating factors: walking Relieving factors: resting  PRECAUTIONS: Fall  RED FLAGS: None   WEIGHT BEARING RESTRICTIONS: No  FALLS:  Has patient fallen in last 6 months? No  LIVING ENVIRONMENT: Lives with: lives with their son Lives in: House/apartment Stairs: steps outdoors Has following equipment at home: None  OCCUPATION: retired  PLOF: Independent  PATIENT GOALS: be able to walk without pain  NEXT MD VISIT: 1 month  OBJECTIVE:  Note: Objective measures were completed at Evaluation unless otherwise noted.  DIAGNOSTIC FINDINGS: na  PATIENT SURVEYS:  Lower Extremity Functional Score: 35 / 80 = 43.8  %  COGNITION: Overall cognitive status: Within functional limits for tasks assessed     SENSATION: WFL    MUSCLE LENGTH: Hamstrings: Right wfl deg; Left wfl deg Thomas test: Right nt deg; Left  nt deg  POSTURE: marked valgum B knees, greater on R  PALPATION: Tender L lateral hip musculature, TFL Tender R glut medius, minimus,  LOWER EXTREMITY ROM:  Passive ROM Right eval Left eval  Hip flexion 105 95  Hip extension    Hip abduction    Hip adduction    Hip internal rotation    Hip external rotation    Knee flexion 112 102  Knee extension -3 -12  Ankle dorsiflexion    Ankle plantarflexion    Ankle inversion    Ankle eversion     (Blank rows = not tested)  LOWER EXTREMITY MMT:  MMT Right eval Left eval  Hip flexion 4- 4-  Hip extension    Hip abduction 3+ 3-  Hip adduction    Hip internal rotation    Hip external rotation    Knee flexion    Knee extension    Ankle  dorsiflexion    Ankle plantarflexion 4- 4-  Ankle inversion    Ankle eversion     (Blank rows = not tested)   FUNCTIONAL TESTS:  Timed up and go (TUG): 18.35  GAIT: Distance walked: 84' in clinic, no device, labored gait, slowed cadence decreased stride length B                                                                                                                              TREATMENT DATE:  [facility] Adult PT Treatment:                                             Date: 11/05/2023 Pt seen for aquatic therapy today.  Treatment took place in water 3.5-4.75 ft in depth at the [facility] pool. Temp of water was 91.  Pt entered/exited the pool via stairs independently with bil rail.  - Intro to aquatic therapy principles - UE on barbell:  walking forward x 3 laps, side stepping 2 laps, backward 2 laps - squatted rest at wall - UE on yellow noodle: walking forward/ backward 2 laps, side stepping 1 lap - UE on yellow hand floats: high knee marching backward/ forward and  sideways - UE on wall:  toe/heel raises x 10; hip add/abdct x10 ; hip flexion /extension x10;  -squatted rest at wall - TrA set with short hollow noodle pull down to thighs x 10  - yellow noodle under arms with UE on wall-> white noodle under arms with UE on corner: cycling (difficulty maintaining neutral trunk)  Pt requires the buoyancy and hydrostatic pressure of water for support, and to offload joints by unweighting joint load by at least 50 % in navel deep water and by at least 75-80% in chest to neck deep water.  Viscosity of the water is needed for resistance of strengthening. Water current perturbations provides challenge to standing balance requiring increased core activation.     10/29/23:  Nustep L 5 x 7 min x 4 exts  Yellow t band B shoulder ER  Yellow t  band rows band under feet B In ll bars with blue t band around thighs, for alt hip abd 15 x In ll bars, blue t band around thighs for alt SLR, opp hip ext ,rocking forward and back Knee flexion B 35# 3 x 10  Knee ext 10# 3 x 10 Supine for 65 cm ball , B knee to chest, LTR, alt SLR, bridging all 15x Side lying R for deep cross friction massage L lateral hip musculature with theragun   10/27/23:  Knee ext B,10 # 3 x 10 Knee flex B 35# 3 x 10 In ll bars for heel/toe rocks on airex 15 x In ll bars with blue t band around thighs, for alt hip abd 15 x In ll  bars, blue t band around thighs for alt SLR, opp hip ext ,rocking forward and back Nustep L 5 x 6 min x 4 exts Side lying for deep cross friction massage L lateral hip over TFL, glut medius with theragun Supine with 65 cm physioball under feet for B knee to chest, LTR, bridging all 10 x  Seated for brief assessment of R shoulder, B shoulder flexion limited, R to 90, L to 110 B shoulder IR/ER wnl MMT B shoulders with pain and weakness R ER, B shoulder flex and abd   10/08/23:  Eval:  side lying on each side for theragun each hip lateral musculature with improved  Sx.  PATIENT EDUCATION:  Education details: POC, goals Person educated: Patient and Child(ren) Education method: Explanation Education comprehension: verbalized understanding  HOME EXERCISE PROGRAM: TBD  ASSESSMENT:  CLINICAL IMPRESSION:  Pt demonstrates safety and independence in aquatic setting with therapist instructing from deck. Pt initially observed as guarded but quickly gained confidence in setting, moving throughout all depths easily with UE support on variety of floatation pieces.  Pt is directed through various movement patterns and trials in standing positions.  She reported mild increase in lumbar discomfort, but reported good tolerance to all exercises. Pt is provided VC and demonstration throughout session for execution of exercises.  Goals are ongoing.     Eval: Patient is a 68 y.o. female who was evaluated today for physical therapy due to chronic pain multiple jts. Presents with postural changes remarkably B knee valgum, also weakness B hips, decreased walking tolerance.  Would benefit from PT with combination of manual techniques, therex, modalities to improve sx and work with her [facility] We discussed aquatic therapy due to multiple jt involvement and she is very interested in pursuing this aspect of PT  OBJECTIVE IMPAIRMENTS: cardiopulmonary status limiting activity, decreased activity tolerance, decreased balance, decreased endurance, decreased knowledge of condition, decreased mobility, difficulty walking, decreased ROM, decreased strength, increased fascial restrictions, impaired perceived functional ability, postural dysfunction, and pain.   ACTIVITY LIMITATIONS: carrying, standing, squatting, stairs, transfers, and locomotion level  PARTICIPATION LIMITATIONS: cleaning, laundry, shopping, community activity, and yard work  PERSONAL FACTORS: Behavior pattern, Time since onset of injury/illness/exacerbation, and 1-2  comorbidities: COPD are also affecting patient's functional outcome.   REHAB POTENTIAL: Good  CLINICAL DECISION MAKING: Evolving/moderate complexity  EVALUATION COMPLEXITY: Moderate   GOALS: Goals reviewed with patient? Yes  SHORT TERM GOALS: Target date: 2 weeks, 10/22/23 I HEP Baseline: Goal status: INITIAL   LONG TERM GOALS: Target date: 01/01/24, 12 weeks  LEFSLower Extremity Functional Score: 35 / 80 = 43.8 % improve to 30%or less disability Baseline:  Goal status: INITIAL  2.  TUG improve from 18 to 14 or less Baseline:  Goal status: INITIAL  3.  Strength B hip abd, ext 5/5 Baseline: 3+ and 4-/5 Goal status: INITIAL     PLAN:  PT FREQUENCY: 2x/week  PT DURATION: 12 weeks  PLANNED INTERVENTIONS: 97110-Therapeutic exercises, 97530- Therapeutic activity, [id number]- Neuromuscular re-education, 97535- Self Care, [id number]- Manual therapy, [phone]- Aquatic Therapy, and Patient/Family education  PLAN FOR NEXT SESSION: assess response to aquatic therapy session  Delon Aquas, PTA 11/05/23 9:31 AM [facility] [address] [place], [place], [address] Phone: [phone]   Fax:  [phone]

## 2023-11-06 ENCOUNTER — Encounter: Payer: Self-pay | Admitting: Family Medicine

## 2023-11-06 NOTE — Telephone Encounter (Signed)
 Patient did not have enough [employer] receipts to forward to [employer] for approval.

## 2023-11-07 ENCOUNTER — Encounter (HOSPITAL_BASED_OUTPATIENT_CLINIC_OR_DEPARTMENT_OTHER): Payer: Self-pay | Admitting: Physical Therapy

## 2023-11-07 ENCOUNTER — Ambulatory Visit (HOSPITAL_BASED_OUTPATIENT_CLINIC_OR_DEPARTMENT_OTHER): Admitting: Physical Therapy

## 2023-11-07 DIAGNOSIS — R262 Difficulty in walking, not elsewhere classified: Secondary | ICD-10-CM

## 2023-11-07 DIAGNOSIS — M5459 Other low back pain: Secondary | ICD-10-CM

## 2023-11-07 DIAGNOSIS — G8929 Other chronic pain: Secondary | ICD-10-CM

## 2023-11-07 DIAGNOSIS — R293 Abnormal posture: Secondary | ICD-10-CM

## 2023-11-07 NOTE — Therapy (Signed)
 OUTPATIENT PHYSICAL THERAPY LOWER EXTREMITY TREATMENT   Patient Name: Rose Miller MRN: [id number] DOB:26-Aug-1955, 68 y.o., female Today's Date: 11/07/2023  END OF SESSION:  PT End of Session - 11/07/23 0818     Visit Number 5    Date for Recertification  01/01/24    PT Start Time 0805   pt arrived late to pool area   PT Stop Time 0845    PT Time Calculation (min) 40 min    Behavior During Therapy [facility] for tasks assessed/performed            Past Medical History:  Diagnosis Date   Adhesive capsulitis    Aortic valve disease    Arthritis    Asthma    Back pain    CKD (chronic kidney disease) stage 3, GFR 30-59 ml/min (HCC)    Constipation    COPD (chronic obstructive pulmonary disease) (HCC)    Diabetes mellitus without complication (HCC) 10 20 2020   Diuretic-induced hypokalemia    Heart murmur    Hypercholesterolemia    Hypertension    IT band syndrome    Morbid obesity (HCC)    Palpitations    Periumbilical pain    Postmenopausal estrogen deficiency    Prediabetes    S/P TAVR (transcatheter aortic valve replacement) 12/26/2020   Ventral hernia    Past Surgical History:  Procedure Laterality Date   BIOPSY  05/01/2021   Procedure: BIOPSY;  Surgeon: Charlanne Groom, MD;  Location: [facility];  Service: Gastroenterology;;   CARDIAC CATHETERIZATION  11/24/2020   CESAREAN SECTION  1989   COLONOSCOPY     COLONOSCOPY WITH PROPOFOL  N/A 05/01/2021   Procedure: COLONOSCOPY WITH PROPOFOL ;  Surgeon: Charlanne Groom, MD;  Location: [facility];  Service: Gastroenterology;  Laterality: N/A;   ESOPHAGOGASTRODUODENOSCOPY (EGD) WITH PROPOFOL  N/A 05/01/2021   Procedure: ESOPHAGOGASTRODUODENOSCOPY (EGD) WITH PROPOFOL ;  Surgeon: Charlanne Groom, MD;  Location: [facility];  Service: Gastroenterology;  Laterality: N/A;   INTRAOPERATIVE TRANSTHORACIC ECHOCARDIOGRAM N/A 12/26/2020   Procedure: INTRAOPERATIVE TRANSTHORACIC ECHOCARDIOGRAM;  Surgeon: Wendel Lurena POUR, MD;   Location: [facility];  Service: Open Heart Surgery;  Laterality: N/A;   MALONEY DILATION  05/01/2021   Procedure: AGAPITO DILATION;  Surgeon: Charlanne Groom, MD;  Location: [facility];  Service: Gastroenterology;;   POLYPECTOMY  05/01/2021   Procedure: POLYPECTOMY;  Surgeon: Charlanne Groom, MD;  Location: [facility];  Service: Gastroenterology;;   RIGHT/LEFT HEART CATH AND CORONARY ANGIOGRAPHY N/A 11/24/2020   Procedure: RIGHT/LEFT HEART CATH AND CORONARY ANGIOGRAPHY;  Surgeon: Claudene Victory ORN, MD;  Location: [facility];  Service: Cardiovascular;  Laterality: N/A;   TONSILLECTOMY AND ADENOIDECTOMY  1960   TRANSCATHETER AORTIC VALVE REPLACEMENT, TRANSFEMORAL N/A 12/26/2020   Procedure: TRANSCATHETER AORTIC VALVE REPLACEMENT, TRANSFEMORAL;  Surgeon: Wendel Lurena POUR, MD;  Location: [facility];  Service: Open Heart Surgery;  Laterality: N/A;   Patient Active Problem List   Diagnosis Date Noted   Acquired hammer toe of right foot 09/26/2023   Atherosclerosis of artery of both lower extremities 09/26/2023   Polyneuropathy due to type 2 diabetes mellitus (HCC) 09/26/2023   Primary localized osteoarthrosis of ankle and foot 09/26/2023   Kidney lesion 04/14/2023   Abscess of lower back 03/17/2023   Ingrown nail of great toe of left foot 12/16/2022   Vitamin D  deficiency 12/16/2022   Low back pain 12/16/2022   Chronic pain of both knees 12/16/2022   Dyspnea on exertion 12/16/2022   Coronary artery disease due to  calcified coronary lesion 12/16/2022   Aortic atherosclerosis 12/16/2022   PAF (paroxysmal atrial fibrillation) (HCC) 03/14/2022   Otalgia of left ear 03/14/2022   Diplopia 09/12/2021   Allergic rhinitis 09/12/2021   Pharyngoesophageal dysphagia    Family history of colon cancer in father    Aortic valve disease 12/26/2020   COPD (chronic obstructive pulmonary disease) (HCC) 12/26/2020   Postmenopausal estrogen deficiency 02/18/2019   Former smoker  05/13/2018   Long-term use of aspirin  therapy 05/13/2018   Peripheral vascular disease 05/05/2018   Prediabetes 05/05/2018   Type 2 diabetes mellitus with obesity 03/31/2017   CKD stage 3 secondary to diabetes (HCC) 03/31/2017   Hypercholesterolemia 06/28/2016   Aortic valve stenosis 03/29/2016   Periumbilical pain 03/29/2016   IT band syndrome 09/11/2014   Right shoulder pain 09/11/2014   Ventral hernia 05/01/2014   Morbid obesity (HCC) 03/17/2014    PCP: Sebastian Righter MD  REFERRING PROVIDER: same  REFERRING DIAG: lower back, B knee, L hip pain  THERAPY DIAG:  Difficulty in walking, not elsewhere classified  Other low back pain  Chronic pain of both knees  Abnormal posture  Rationale for Evaluation and Treatment: Rehabilitation  ONSET DATE: December 2024  SUBJECTIVE:   SUBJECTIVE STATEMENT: I was sore... but a good sore after aquatic therapy session.    POOL ACCESS:  Pt is member of [employer].    PERTINENT HISTORY: Referred by PCP due to pain complaints knees, hips lower back PAIN:  Are you having pain? Yes: NPRS scale: 5/10 Pain location: lateral hips B knees primarily lateral jt line Pain description: deep pain with walking Aggravating factors: walking Relieving factors: resting  PRECAUTIONS: Fall  RED FLAGS: None   WEIGHT BEARING RESTRICTIONS: No  FALLS:  Has patient fallen in last 6 months? No  LIVING ENVIRONMENT: Lives with: lives with their son Lives in: House/apartment Stairs: steps outdoors Has following equipment at home: None  OCCUPATION: retired  PLOF: Independent  PATIENT GOALS: be able to walk without pain  NEXT MD VISIT: 1 month  OBJECTIVE:  Note: Objective measures were completed at Evaluation unless otherwise noted.  DIAGNOSTIC FINDINGS: na  PATIENT SURVEYS:  Lower Extremity Functional Score: 35 / 80 = 43.8 %  COGNITION: Overall cognitive status: Within functional limits for tasks  assessed     SENSATION: WFL    MUSCLE LENGTH: Hamstrings: Right wfl deg; Left wfl deg Thomas test: Right nt deg; Left  nt deg  POSTURE: marked valgum B knees, greater on R  PALPATION: Tender L lateral hip musculature, TFL Tender R glut medius, minimus,  LOWER EXTREMITY ROM:  Passive ROM Right eval Left eval  Hip flexion 105 95  Hip extension    Hip abduction    Hip adduction    Hip internal rotation    Hip external rotation    Knee flexion 112 102  Knee extension -3 -12  Ankle dorsiflexion    Ankle plantarflexion    Ankle inversion    Ankle eversion     (Blank rows = not tested)  LOWER EXTREMITY MMT:  MMT Right eval Left eval  Hip flexion 4- 4-  Hip extension    Hip abduction 3+ 3-  Hip adduction    Hip internal rotation    Hip external rotation    Knee flexion    Knee extension    Ankle dorsiflexion    Ankle plantarflexion 4- 4-  Ankle inversion    Ankle eversion     (Blank rows = not tested)  FUNCTIONAL TESTS:  Timed up and go (TUG): 18.35  GAIT: Distance walked: 76' in clinic, no device, labored gait, slowed cadence decreased stride length B                                                                                                                              TREATMENT DATE:  [facility] Adult PT Treatment:                                             Date: 11/07/2023 Pt seen for aquatic therapy today.  Treatment took place in water 3.5-4.75 ft in depth at the [facility] pool. Temp of water was 91.  Pt entered/exited the pool via stairs independently with bil rail.  - unsupported:  walking/backward x 3 laps, side stepping 2 laps -side stepping with arm add/abdct with short hollow noodles x 1 lap - marching with reciprocal row motion with short hollow noodles x 2 laps - squatted rest at wall - UE on wall:  toe/heel raises x 15; hip add/abdct x10 ; hip flexion /extension x10;  - return to walking forward/ backward 2 laps  -UE on wall -  10 squats  - TrA set with short hollow noodle pull down to thighs x 10; in staggered stance x 5 each (good balance challenge) - at rails:  L stretch with knees bent x 20sec, 2nd rep with gentle tail wag;  L/R hamstring stretch with foot on 2nd step standing up straight x 20sec each  [facility] Adult PT Treatment:                                             Date: 11/05/2023 Pt seen for aquatic therapy today.  Treatment took place in water 3.5-4.75 ft in depth at the [facility] pool. Temp of water was 91.  Pt entered/exited the pool via stairs independently with bil rail.  - Intro to aquatic therapy principles - UE on barbell:  walking forward x 3 laps, side stepping 2 laps, backward 2 laps - squatted rest at wall - UE on yellow noodle: walking forward/ backward 2 laps, side stepping 1 lap - UE on yellow hand floats: high knee marching backward/ forward and sideways - UE on wall:  toe/heel raises x 10; hip add/abdct x10 ; hip flexion /extension x10;  -squatted rest at wall - TrA set with short hollow noodle pull down to thighs x 10  - yellow noodle under arms with UE on wall-> white noodle under arms with UE on corner: cycling (difficulty maintaining neutral trunk)  10/29/23:  Nustep L 5 x 7 min x 4 exts  Yellow t band B shoulder ER  Yellow t  band rows band  under feet B In ll bars with blue t band around thighs, for alt hip abd 15 x In ll bars, blue t band around thighs for alt SLR, opp hip ext ,rocking forward and back Knee flexion B 35# 3 x 10  Knee ext 10# 3 x 10 Supine for 65 cm ball , B knee to chest, LTR, alt SLR, bridging all 15x Side lying R for deep cross friction massage L lateral hip musculature with theragun   10/27/23:  Knee ext B,10 # 3 x 10 Knee flex B 35# 3 x 10 In ll bars for heel/toe rocks on airex 15 x In ll bars with blue t band around thighs, for alt hip abd 15 x In ll bars, blue t band around thighs for alt SLR, opp hip ext ,rocking forward and back Nustep  L 5 x 6 min x 4 exts Side lying for deep cross friction massage L lateral hip over TFL, glut medius with theragun Supine with 65 cm physioball under feet for B knee to chest, LTR, bridging all 10 x  Seated for brief assessment of R shoulder, B shoulder flexion limited, R to 90, L to 110 B shoulder IR/ER wnl MMT B shoulders with pain and weakness R ER, B shoulder flex and abd   10/08/23:  Eval:  side lying on each side for theragun each hip lateral musculature with improved Sx.  PATIENT EDUCATION:  Education details: POC, goals Person educated: Patient and Child(ren) Education method: Explanation Education comprehension: verbalized understanding  HOME EXERCISE PROGRAM: TBD  ASSESSMENT:  CLINICAL IMPRESSION:  Pt observed with increased confidence, moving throughout all depths easily without UE support. She tolerated session well, without increase in pain in back.  Goals are ongoing.     Eval: Patient is a 68 y.o. female who was evaluated today for physical therapy due to chronic pain multiple jts. Presents with postural changes remarkably B knee valgum, also weakness B hips, decreased walking tolerance.  Would benefit from PT with combination of manual techniques, therex, modalities to improve sx and work with [facility] We discussed aquatic therapy due to multiple jt involvement and she is very interested in pursuing this aspect of PT  OBJECTIVE IMPAIRMENTS: cardiopulmonary status limiting activity, decreased activity tolerance, decreased balance, decreased endurance, decreased knowledge of condition, decreased mobility, difficulty walking, decreased ROM, decreased strength, increased fascial restrictions, impaired perceived functional ability, postural dysfunction, and pain.   ACTIVITY LIMITATIONS: carrying, standing, squatting, stairs, transfers, and locomotion level  PARTICIPATION LIMITATIONS: cleaning, laundry, shopping, community  activity, and yard work  PERSONAL FACTORS: Behavior pattern, Time since onset of injury/illness/exacerbation, and 1-2 comorbidities: COPD are also affecting patient's functional outcome.   REHAB POTENTIAL: Good  CLINICAL DECISION MAKING: Evolving/moderate complexity  EVALUATION COMPLEXITY: Moderate   GOALS: Goals reviewed with patient? Yes  SHORT TERM GOALS: Target date: 2 weeks, 10/22/23 I HEP Baseline: Goal status: INITIAL   LONG TERM GOALS: Target date: 01/01/24, 12 weeks  LEFSLower Extremity Functional Score: 35 / 80 = 43.8 % improve to 30%or less disability Baseline:  Goal status: INITIAL  2.  TUG improve from 18 to 14 or less Baseline:  Goal status: INITIAL  3.  Strength B hip abd, ext 5/5 Baseline: 3+ and 4-/5 Goal status: INITIAL     PLAN:  PT FREQUENCY: 2x/week  PT DURATION: 12 weeks  PLANNED INTERVENTIONS: 97110-Therapeutic exercises, 97530- Therapeutic activity, [id number]- Neuromuscular re-education, 97535- Self Care, [id number]- Manual therapy, [id number]-  Aquatic Therapy, and Patient/Family education  PLAN FOR NEXT SESSION: assess response to aquatic therapy session  Delon Aquas, PTA 11/07/23 8:52 AM [facility] [address] [place], [place], [address] Phone: [phone]   Fax:  [phone]

## 2023-11-10 ENCOUNTER — Ambulatory Visit

## 2023-11-10 ENCOUNTER — Other Ambulatory Visit (HOSPITAL_COMMUNITY): Payer: Self-pay

## 2023-11-12 ENCOUNTER — Encounter (HOSPITAL_BASED_OUTPATIENT_CLINIC_OR_DEPARTMENT_OTHER): Payer: Self-pay | Admitting: Physical Therapy

## 2023-11-12 ENCOUNTER — Ambulatory Visit (HOSPITAL_BASED_OUTPATIENT_CLINIC_OR_DEPARTMENT_OTHER): Admitting: Physical Therapy

## 2023-11-12 ENCOUNTER — Ambulatory Visit

## 2023-11-12 DIAGNOSIS — R262 Difficulty in walking, not elsewhere classified: Secondary | ICD-10-CM | POA: Diagnosis not present

## 2023-11-12 DIAGNOSIS — R293 Abnormal posture: Secondary | ICD-10-CM

## 2023-11-12 DIAGNOSIS — M5459 Other low back pain: Secondary | ICD-10-CM

## 2023-11-12 DIAGNOSIS — G8929 Other chronic pain: Secondary | ICD-10-CM

## 2023-11-12 NOTE — Therapy (Signed)
 OUTPATIENT PHYSICAL THERAPY LOWER EXTREMITY TREATMENT   Patient Name: Rose Miller MRN: [id number] DOB:06-Oct-1955, 68 y.o., female Today's Date: 11/12/2023  END OF SESSION:  PT End of Session - 11/12/23 1157     Visit Number 6    Date for Recertification  01/01/24    PT Start Time 1155   pt arrived late to pool area   PT Stop Time 1233    PT Time Calculation (min) 38 min    Activity Tolerance Patient tolerated treatment well    Behavior During Therapy [facility] for tasks assessed/performed            Past Medical History:  Diagnosis Date   Adhesive capsulitis    Aortic valve disease    Arthritis    Asthma    Back pain    CKD (chronic kidney disease) stage 3, GFR 30-59 ml/min (HCC)    Constipation    COPD (chronic obstructive pulmonary disease) (HCC)    Diabetes mellitus without complication (HCC) 10 20 2020   Diuretic-induced hypokalemia    Heart murmur    Hypercholesterolemia    Hypertension    IT band syndrome    Morbid obesity (HCC)    Palpitations    Periumbilical pain    Postmenopausal estrogen deficiency    Prediabetes    S/P TAVR (transcatheter aortic valve replacement) 12/26/2020   Ventral hernia    Past Surgical History:  Procedure Laterality Date   BIOPSY  05/01/2021   Procedure: BIOPSY;  Surgeon: Charlanne Groom, MD;  Location: [facility];  Service: Gastroenterology;;   CARDIAC CATHETERIZATION  11/24/2020   CESAREAN SECTION  1989   COLONOSCOPY     COLONOSCOPY WITH PROPOFOL  N/A 05/01/2021   Procedure: COLONOSCOPY WITH PROPOFOL ;  Surgeon: Charlanne Groom, MD;  Location: [facility];  Service: Gastroenterology;  Laterality: N/A;   ESOPHAGOGASTRODUODENOSCOPY (EGD) WITH PROPOFOL  N/A 05/01/2021   Procedure: ESOPHAGOGASTRODUODENOSCOPY (EGD) WITH PROPOFOL ;  Surgeon: Charlanne Groom, MD;  Location: [facility];  Service: Gastroenterology;  Laterality: N/A;   INTRAOPERATIVE TRANSTHORACIC ECHOCARDIOGRAM N/A 12/26/2020   Procedure: INTRAOPERATIVE TRANSTHORACIC  ECHOCARDIOGRAM;  Surgeon: Wendel Lurena POUR, MD;  Location: [facility];  Service: Open Heart Surgery;  Laterality: N/A;   MALONEY DILATION  05/01/2021   Procedure: AGAPITO DILATION;  Surgeon: Charlanne Groom, MD;  Location: [facility];  Service: Gastroenterology;;   POLYPECTOMY  05/01/2021   Procedure: POLYPECTOMY;  Surgeon: Charlanne Groom, MD;  Location: [facility];  Service: Gastroenterology;;   RIGHT/LEFT HEART CATH AND CORONARY ANGIOGRAPHY N/A 11/24/2020   Procedure: RIGHT/LEFT HEART CATH AND CORONARY ANGIOGRAPHY;  Surgeon: Claudene Victory ORN, MD;  [facility];  Service: Cardiovascular;  Laterality: N/A;   TONSILLECTOMY AND ADENOIDECTOMY  1960   TRANSCATHETER AORTIC VALVE REPLACEMENT, TRANSFEMORAL N/A 12/26/2020   Procedure: TRANSCATHETER AORTIC VALVE REPLACEMENT, TRANSFEMORAL;  Surgeon: Wendel Lurena POUR, MD;  Location: [facility];  Service: Open Heart Surgery;  Laterality: N/A;   Patient Active Problem List   Diagnosis Date Noted   Acquired hammer toe of right foot 09/26/2023   Atherosclerosis of artery of both lower extremities 09/26/2023   Polyneuropathy due to type 2 diabetes mellitus (HCC) 09/26/2023   Primary localized osteoarthrosis of ankle and foot 09/26/2023   Kidney lesion 04/14/2023   Abscess of lower back 03/17/2023   Ingrown nail of great toe of left foot 12/16/2022   Vitamin D  deficiency 12/16/2022   Low back pain 12/16/2022   Chronic pain of both knees 12/16/2022   Dyspnea on  exertion 12/16/2022   Coronary artery disease due to calcified coronary lesion 12/16/2022   Aortic atherosclerosis 12/16/2022   PAF (paroxysmal atrial fibrillation) (HCC) 03/14/2022   Otalgia of left ear 03/14/2022   Diplopia 09/12/2021   Allergic rhinitis 09/12/2021   Pharyngoesophageal dysphagia    Family history of colon cancer in father    Aortic valve disease 12/26/2020   COPD (chronic obstructive pulmonary disease) (HCC) 12/26/2020   Postmenopausal estrogen  deficiency 02/18/2019   Former smoker 05/13/2018   Long-term use of aspirin  therapy 05/13/2018   Peripheral vascular disease 05/05/2018   Prediabetes 05/05/2018   Type 2 diabetes mellitus with obesity 03/31/2017   CKD stage 3 secondary to diabetes (HCC) 03/31/2017   Hypercholesterolemia 06/28/2016   Aortic valve stenosis 03/29/2016   Periumbilical pain 03/29/2016   IT band syndrome 09/11/2014   Right shoulder pain 09/11/2014   Ventral hernia 05/01/2014   Morbid obesity (HCC) 03/17/2014    PCP: Sebastian Righter MD  REFERRING PROVIDER: same  REFERRING DIAG: lower back, B knee, L hip pain  THERAPY DIAG:  Difficulty in walking, not elsewhere classified  Other low back pain  Chronic pain of both knees  Abnormal posture  Rationale for Evaluation and Treatment: Rehabilitation  ONSET DATE: December 2024  SUBJECTIVE:   SUBJECTIVE STATEMENT: I was sore for 2 days but I liked it. I think this is helping.     POOL ACCESS:  Pt is member of [employer].    PERTINENT HISTORY: Referred by PCP due to pain complaints knees, hips lower back PAIN:  Are you having pain? Yes: NPRS scale: 3/10 Pain location: lateral hips, B knees, lower back  --(and Rt shoulder 4/10) Pain description: deep pain with walking Aggravating factors: walking Relieving factors: resting  PRECAUTIONS: Fall  RED FLAGS: None   WEIGHT BEARING RESTRICTIONS: No  FALLS:  Has patient fallen in last 6 months? No  LIVING ENVIRONMENT: Lives with: lives with their son Lives in: House/apartment Stairs: steps outdoors Has following equipment at home: None  OCCUPATION: retired  PLOF: Independent  PATIENT GOALS: be able to walk without pain  NEXT MD VISIT: 1 month  OBJECTIVE:  Note: Objective measures were completed at Evaluation unless otherwise noted.  DIAGNOSTIC FINDINGS: na  PATIENT SURVEYS:  Lower Extremity Functional Score: 35 / 80 = 43.8 %  COGNITION: Overall cognitive status: Within functional  limits for tasks assessed     SENSATION: WFL    MUSCLE LENGTH: Hamstrings: Right wfl deg; Left wfl deg Thomas test: Right nt deg; Left  nt deg  POSTURE: marked valgum B knees, greater on R  PALPATION: Tender L lateral hip musculature, TFL Tender R glut medius, minimus,  LOWER EXTREMITY ROM:  Passive ROM Right eval Left eval  Hip flexion 105 95  Hip extension    Hip abduction    Hip adduction    Hip internal rotation    Hip external rotation    Knee flexion 112 102  Knee extension -3 -12  Ankle dorsiflexion    Ankle plantarflexion    Ankle inversion    Ankle eversion     (Blank rows = not tested)  LOWER EXTREMITY MMT:  MMT Right eval Left eval  Hip flexion 4- 4-  Hip extension    Hip abduction 3+ 3-  Hip adduction    Hip internal rotation    Hip external rotation    Knee flexion    Knee extension    Ankle dorsiflexion    Ankle plantarflexion 4- 4-  Ankle inversion    Ankle eversion     (Blank rows = not tested)   FUNCTIONAL TESTS:  Timed up and go (TUG): 18.35  GAIT: Distance walked: 47' in clinic, no device, labored gait, slowed cadence decreased stride length B                                                                                                                              TREATMENT DATE:  [facility] Adult PT Treatment:                                             Date: 11/12/2023 Pt seen for aquatic therapy today.  Treatment took place in water 3.5-4.75 ft in depth at the [facility] pool. Temp of water was 91.  Pt entered/exited the pool via stairs independently with bil rail.  - unsupported:  walking/backward x 3 laps, side stepping 3 laps - UE on wall:  toe/heel raises x 20; hip add/abdct 2x10 ; hip flexion /extension 2x10; - marching with reciprocal row motion with short hollow noodles -> forward walking kicks - TrA set with short hollow noodle pull down to thighs x 10; in staggered stance x 5 each   -side stepping with arm  add/abdct with short hollow noodles x 1 lap - squatted rest at wall - suitcase carry with long hollow noodle one side, walking backwards and forwards 1/2 widths (LOB on R)- UE to wall to steady -UE on wall - 10 squats  - STS at bench in water with feet on ground, x 10 with cues for neutral head and core engaged    [facility] Adult PT Treatment:                                             Date: 11/07/2023 Pt seen for aquatic therapy today.  Treatment took place in water 3.5-4.75 ft in depth at the [facility] pool. Temp of water was 91.  Pt entered/exited the pool via stairs independently with bil rail.  - unsupported:  walking/backward x 3 laps, side stepping 2 laps -side stepping with arm add/abdct with short hollow noodles x 1 lap - marching with reciprocal row motion with short hollow noodles x 2 laps - squatted rest at wall - UE on wall:  toe/heel raises x 15; hip add/abdct x10 ; hip flexion /extension x10;  - return to walking forward/ backward 2 laps  -UE on wall - 10 squats  - TrA set with short hollow noodle pull down to thighs x 10; in staggered stance x 5 each (good balance challenge) - at rails:  L stretch with knees bent x 20sec, 2nd rep with gentle tail wag;  L/R hamstring stretch  with foot on 2nd step standing up straight x 20sec each  [facility] Adult PT Treatment:                                             Date: 11/05/2023 Pt seen for aquatic therapy today.  Treatment took place in water 3.5-4.75 ft in depth at the [facility] pool. Temp of water was 91.  Pt entered/exited the pool via stairs independently with bil rail.  - Intro to aquatic therapy principles - UE on barbell:  walking forward x 3 laps, side stepping 2 laps, backward 2 laps - squatted rest at wall - UE on yellow noodle: walking forward/ backward 2 laps, side stepping 1 lap - UE on yellow hand floats: high knee marching backward/ forward and sideways - UE on wall:  toe/heel raises x 10; hip  add/abdct x10 ; hip flexion /extension x10;  -squatted rest at wall - TrA set with short hollow noodle pull down to thighs x 10  - yellow noodle under arms with UE on wall-> white noodle under arms with UE on corner: cycling (difficulty maintaining neutral trunk)  10/29/23:  Nustep L 5 x 7 min x 4 exts  Yellow t band B shoulder ER  Yellow t  band rows band under feet B In ll bars with blue t band around thighs, for alt hip abd 15 x In ll bars, blue t band around thighs for alt SLR, opp hip ext ,rocking forward and back Knee flexion B 35# 3 x 10  Knee ext 10# 3 x 10 Supine for 65 cm ball , B knee to chest, LTR, alt SLR, bridging all 15x Side lying R for deep cross friction massage L lateral hip musculature with theragun   10/27/23:  Knee ext B,10 # 3 x 10 Knee flex B 35# 3 x 10 In ll bars for heel/toe rocks on airex 15 x In ll bars with blue t band around thighs, for alt hip abd 15 x In ll bars, blue t band around thighs for alt SLR, opp hip ext ,rocking forward and back Nustep L 5 x 6 min x 4 exts Side lying for deep cross friction massage L lateral hip over TFL, glut medius with theragun Supine with 65 cm physioball under feet for B knee to chest, LTR, bridging all 10 x  Seated for brief assessment of R shoulder, B shoulder flexion limited, R to 90, L to 110 B shoulder IR/ER wnl MMT B shoulders with pain and weakness R ER, B shoulder flex and abd   10/08/23:  Eval:  side lying on each side for theragun each hip lateral musculature with improved Sx.  PATIENT EDUCATION:  Education details: exercise rationale, progression and modification  Person educated: Patient  Education method: Explanation, demonstration Education comprehension: verbalized understanding  HOME EXERCISE PROGRAM: TBD  ASSESSMENT:  CLINICAL IMPRESSION:  Positive response to aquatic therapy thus far with improved pain levels.  Improved exercise tolerance in water; pt didn't require as many rest breaks as  in previous sessions. Some difficulty with suitcase carry with RUE; required LUE on wall to steady and Rt shoulder had increased pain afterwards. Rt shoulder pain reduced with change in exercise.  Goals are ongoing.   Will continue to progress as tolerated.     Eval: Patient is a 68 y.o. female who was evaluated today for physical  therapy due to chronic pain multiple jts. Presents with postural changes remarkably B knee valgum, also weakness B hips, decreased walking tolerance.  Would benefit from PT with combination of manual techniques, therex, modalities to improve sx and work with her [facility] We discussed aquatic therapy due to multiple jt involvement and she is very interested in pursuing this aspect of PT  OBJECTIVE IMPAIRMENTS: cardiopulmonary status limiting activity, decreased activity tolerance, decreased balance, decreased endurance, decreased knowledge of condition, decreased mobility, difficulty walking, decreased ROM, decreased strength, increased fascial restrictions, impaired perceived functional ability, postural dysfunction, and pain.   ACTIVITY LIMITATIONS: carrying, standing, squatting, stairs, transfers, and locomotion level  PARTICIPATION LIMITATIONS: cleaning, laundry, shopping, community activity, and yard work  PERSONAL FACTORS: Behavior pattern, Time since onset of injury/illness/exacerbation, and 1-2 comorbidities: COPD are also affecting patient's functional outcome.   REHAB POTENTIAL: Good  CLINICAL DECISION MAKING: Evolving/moderate complexity  EVALUATION COMPLEXITY: Moderate   GOALS: Goals reviewed with patient? Yes  SHORT TERM GOALS: Target date: 2 weeks, 10/22/23 I HEP Baseline: Goal status: INITIAL   LONG TERM GOALS: Target date: 01/01/24, 12 weeks  LEFSLower Extremity Functional Score: 35 / 80 = 43.8 % improve to 30%or less disability Baseline:  Goal status: INITIAL  2.  TUG improve from 18 to 14 or  less Baseline:  Goal status: INITIAL  3.  Strength B hip abd, ext 5/5 Baseline: 3+ and 4-/5 Goal status: INITIAL     PLAN:  PT FREQUENCY: 2x/week  PT DURATION: 12 weeks  PLANNED INTERVENTIONS: 97110-Therapeutic exercises, 97530- Therapeutic activity, [id number]- Neuromuscular re-education, 97535- Self Care, [id number]- Manual therapy, [phone]- Aquatic Therapy, and Patient/Family education  PLAN FOR NEXT SESSION: assess response to aquatic therapy session  Delon Aquas, PTA 11/12/23 12:44 PM [facility] [address] [place], [place], [address] Phone: [phone]   Fax:  [phone]

## 2023-11-13 ENCOUNTER — Other Ambulatory Visit: Payer: Self-pay | Admitting: Family

## 2023-11-13 ENCOUNTER — Other Ambulatory Visit (HOSPITAL_COMMUNITY): Payer: Self-pay

## 2023-11-14 ENCOUNTER — Encounter (HOSPITAL_BASED_OUTPATIENT_CLINIC_OR_DEPARTMENT_OTHER): Payer: Self-pay | Admitting: Physical Therapy

## 2023-11-14 ENCOUNTER — Ambulatory Visit (HOSPITAL_BASED_OUTPATIENT_CLINIC_OR_DEPARTMENT_OTHER): Admitting: Physical Therapy

## 2023-11-14 DIAGNOSIS — R262 Difficulty in walking, not elsewhere classified: Secondary | ICD-10-CM

## 2023-11-14 DIAGNOSIS — R293 Abnormal posture: Secondary | ICD-10-CM

## 2023-11-14 DIAGNOSIS — M5459 Other low back pain: Secondary | ICD-10-CM

## 2023-11-14 DIAGNOSIS — G8929 Other chronic pain: Secondary | ICD-10-CM

## 2023-11-14 NOTE — Therapy (Signed)
 OUTPATIENT PHYSICAL THERAPY LOWER EXTREMITY TREATMENT   Patient Name: Rose Miller MRN: [id number] DOB:[birth year], 68 y.o., female Today's Date: 11/14/2023  END OF SESSION:  PT End of Session - 11/14/23 1537     Visit Number 7    Date for Recertification  01/01/24    PT Start Time 1520    PT Stop Time 1558    PT Time Calculation (min) 38 min    Activity Tolerance Patient tolerated treatment well    Behavior During Therapy [facility] for tasks assessed/performed           Past Medical History:  Diagnosis Date   Adhesive capsulitis    Aortic valve disease    Arthritis    Asthma    Back pain    CKD (chronic kidney disease) stage 3, GFR 30-59 ml/min (HCC)    Constipation    COPD (chronic obstructive pulmonary disease) (HCC)    Diabetes mellitus without complication (HCC) 10 20 2020   Diuretic-induced hypokalemia    Heart murmur    Hypercholesterolemia    Hypertension    IT band syndrome    Morbid obesity (HCC)    Palpitations    Periumbilical pain    Postmenopausal estrogen deficiency    Prediabetes    S/P TAVR (transcatheter aortic valve replacement) 12/26/2020   Ventral hernia    Past Surgical History:  Procedure Laterality Date   BIOPSY  05/01/2021   Procedure: BIOPSY;  Surgeon: Charlanne Groom, MD;  Location: [facility];  Service: Gastroenterology;;   CARDIAC CATHETERIZATION  11/24/2020   CESAREAN SECTION  1989   COLONOSCOPY     COLONOSCOPY WITH PROPOFOL  N/A 05/01/2021   Procedure: COLONOSCOPY WITH PROPOFOL ;  Surgeon: Charlanne Groom, MD;  Location: [facility];  Service: Gastroenterology;  Laterality: N/A;   ESOPHAGOGASTRODUODENOSCOPY (EGD) WITH PROPOFOL  N/A 05/01/2021   Procedure: ESOPHAGOGASTRODUODENOSCOPY (EGD) WITH PROPOFOL ;  Surgeon: Charlanne Groom, MD;  Location: [facility];  Service: Gastroenterology;  Laterality: N/A;   INTRAOPERATIVE TRANSTHORACIC ECHOCARDIOGRAM N/A 12/26/2020   Procedure: INTRAOPERATIVE TRANSTHORACIC ECHOCARDIOGRAM;  Surgeon:  Wendel Lurena POUR, MD;  Location: [facility];  Service: Open Heart Surgery;  Laterality: N/A;   MALONEY DILATION  05/01/2021   Procedure: AGAPITO DILATION;  Surgeon: Charlanne Groom, MD;  Location: [facility];  Service: Gastroenterology;;   POLYPECTOMY  05/01/2021   Procedure: POLYPECTOMY;  Surgeon: Charlanne Groom, MD;  Location: [facility];  Service: Gastroenterology;;   RIGHT/LEFT HEART CATH AND CORONARY ANGIOGRAPHY N/A 11/24/2020   Procedure: RIGHT/LEFT HEART CATH AND CORONARY ANGIOGRAPHY;  Surgeon: Claudene Victory ORN, MD;  Location: [facility];  Service: Cardiovascular;  Laterality: N/A;   TONSILLECTOMY AND ADENOIDECTOMY  1960   TRANSCATHETER AORTIC VALVE REPLACEMENT, TRANSFEMORAL N/A 12/26/2020   Procedure: TRANSCATHETER AORTIC VALVE REPLACEMENT, TRANSFEMORAL;  Surgeon: Wendel Lurena POUR, MD;  Location: [facility];  Service: Open Heart Surgery;  Laterality: N/A;   Patient Active Problem List   Diagnosis Date Noted   Acquired hammer toe of right foot 09/26/2023   Atherosclerosis of artery of both lower extremities 09/26/2023   Polyneuropathy due to type 2 diabetes mellitus (HCC) 09/26/2023   Primary localized osteoarthrosis of ankle and foot 09/26/2023   Kidney lesion 04/14/2023   Abscess of lower back 03/17/2023   Ingrown nail of great toe of left foot 12/16/2022   Vitamin D  deficiency 12/16/2022   Low back pain 12/16/2022   Chronic pain of both knees 12/16/2022   Dyspnea on exertion 12/16/2022   Coronary artery disease due  to calcified coronary lesion 12/16/2022   Aortic atherosclerosis 12/16/2022   PAF (paroxysmal atrial fibrillation) (HCC) 03/14/2022   Otalgia of left ear 03/14/2022   Diplopia 09/12/2021   Allergic rhinitis 09/12/2021   Pharyngoesophageal dysphagia    Family history of colon cancer in father    Aortic valve disease 12/26/2020   COPD (chronic obstructive pulmonary disease) (HCC) 12/26/2020   Postmenopausal estrogen deficiency 02/18/2019    Former smoker 05/13/2018   Long-term use of aspirin  therapy 05/13/2018   Peripheral vascular disease 05/05/2018   Prediabetes 05/05/2018   Type 2 diabetes mellitus with obesity 03/31/2017   CKD stage 3 secondary to diabetes (HCC) 03/31/2017   Hypercholesterolemia 06/28/2016   Aortic valve stenosis 03/29/2016   Periumbilical pain 03/29/2016   IT band syndrome 09/11/2014   Right shoulder pain 09/11/2014   Ventral hernia 05/01/2014   Morbid obesity (HCC) 03/17/2014    PCP: Sebastian Righter MD  REFERRING PROVIDER: same  REFERRING DIAG: lower back, B knee, L hip pain  THERAPY DIAG:  Difficulty in walking, not elsewhere classified  Other low back pain  Chronic pain of both knees  Abnormal posture  Rationale for Evaluation and Treatment: Rehabilitation  ONSET DATE: December 2024  SUBJECTIVE:   SUBJECTIVE STATEMENT: I was pretty sore for last 2 days.  I'm stiff!     POOL ACCESS:  Pt is member of [employer].    PERTINENT HISTORY: Referred by PCP due to pain complaints knees, hips lower back PAIN:  Are you having pain? Yes: NPRS scale: 3/10 Pain location: lateral hips, B knees, lower back  --(and Rt shoulder 4/10) Pain description: deep pain with walking Aggravating factors: walking Relieving factors: resting  PRECAUTIONS: Fall  RED FLAGS: None   WEIGHT BEARING RESTRICTIONS: No  FALLS:  Has patient fallen in last 6 months? No  LIVING ENVIRONMENT: Lives with: lives with their son Lives in: House/apartment Stairs: steps outdoors Has following equipment at home: None  OCCUPATION: retired  PLOF: Independent  PATIENT GOALS: be able to walk without pain  NEXT MD VISIT: 1 month  OBJECTIVE:  Note: Objective measures were completed at Evaluation unless otherwise noted.  DIAGNOSTIC FINDINGS: na  PATIENT SURVEYS:  Lower Extremity Functional Score: 35 / 80 = 43.8 %  COGNITION: Overall cognitive status: Within functional limits for tasks  assessed     SENSATION: WFL    MUSCLE LENGTH: Hamstrings: Right wfl deg; Left wfl deg Thomas test: Right nt deg; Left  nt deg  POSTURE: marked valgum B knees, greater on R  PALPATION: Tender L lateral hip musculature, TFL Tender R glut medius, minimus,  LOWER EXTREMITY ROM:  Passive ROM Right eval Left eval  Hip flexion 105 95  Hip extension    Hip abduction    Hip adduction    Hip internal rotation    Hip external rotation    Knee flexion 112 102  Knee extension -3 -12  Ankle dorsiflexion    Ankle plantarflexion    Ankle inversion    Ankle eversion     (Blank rows = not tested)  LOWER EXTREMITY MMT:  MMT Right eval Left eval  Hip flexion 4- 4-  Hip extension    Hip abduction 3+ 3-  Hip adduction    Hip internal rotation    Hip external rotation    Knee flexion    Knee extension    Ankle dorsiflexion    Ankle plantarflexion 4- 4-  Ankle inversion    Ankle eversion     (  Blank rows = not tested)   FUNCTIONAL TESTS:  Timed up and go (TUG): 18.35  GAIT: Distance walked: 71' in clinic, no device, labored gait, slowed cadence decreased stride length B                                                                                                                              TREATMENT DATE:  [facility] Adult PT Treatment:                                             Date: 11/14/2023 Pt seen for aquatic therapy today.  Treatment took place in water 3.5-4.75 ft in depth at the [facility] pool. Temp of water was 91.  Pt entered/exited the pool via stairs independently with bil rail.  - unsupported:  walking/backward x 3 laps with reciprocal row motion, side stepping 2 laps with horiz abdct/add; side stepping with arm add/abdct with short hollow noodles x 1 lap - UE on wall: alternating hamstring curls x 10 each LE; toe/heel raises x 20;  single leg clams x 10 each (limited ROM); hip crosses x 10 each; hip abdct/ add 2x10 - return to walking with relaxed  arms - UE on yellow hand floats: forward walking kicks  - UE on wall:  ; hip add/abdct 2x10 ; hip flexion /extension 2x10; - marching with reciprocal row motion with short hollow noodles -> forward walking kicks - TrA set with short hollow noodle pull down to thighs x 3 -> long hollow noodle x 8 (pain in R shoulder) - STS at bench in water with feet on blue step, x 10 with cues for neutral head and core engaged    [facility] Adult PT Treatment:                                             Date: 11/07/2023 Pt seen for aquatic therapy today.  Treatment took place in water 3.5-4.75 ft in depth at the [facility] pool. Temp of water was 91.  Pt entered/exited the pool via stairs independently with bil rail.  - unsupported:  walking/backward x 3 laps, side stepping 2 laps -side stepping with arm add/abdct with short hollow noodles x 1 lap - marching with reciprocal row motion with short hollow noodles x 2 laps - squatted rest at wall - UE on wall:  toe/heel raises x 15; hip add/abdct x10 ; hip flexion /extension x10;  - return to walking forward/ backward 2 laps  -UE on wall - 10 squats  - TrA set with short hollow noodle pull down to thighs x 10; in staggered stance x 5 each (good balance challenge) - at rails:  L stretch with knees bent x  20sec, 2nd rep with gentle tail wag;  L/R hamstring stretch with foot on 2nd step standing up straight x 20sec each  [facility] Adult PT Treatment:                                             Date: 11/05/2023 Pt seen for aquatic therapy today.  Treatment took place in water 3.5-4.75 ft in depth at the [facility] pool. Temp of water was 91.  Pt entered/exited the pool via stairs independently with bil rail.  - Intro to aquatic therapy principles - UE on barbell:  walking forward x 3 laps, side stepping 2 laps, backward 2 laps - squatted rest at wall - UE on yellow noodle: walking forward/ backward 2 laps, side stepping 1 lap - UE on yellow hand  floats: high knee marching backward/ forward and sideways - UE on wall:  toe/heel raises x 10; hip add/abdct x10 ; hip flexion /extension x10;  -squatted rest at wall - TrA set with short hollow noodle pull down to thighs x 10  - yellow noodle under arms with UE on wall-> white noodle under arms with UE on corner: cycling (difficulty maintaining neutral trunk)  10/29/23:  Nustep L 5 x 7 min x 4 exts  Yellow t band B shoulder ER  Yellow t  band rows band under feet B In ll bars with blue t band around thighs, for alt hip abd 15 x In ll bars, blue t band around thighs for alt SLR, opp hip ext ,rocking forward and back Knee flexion B 35# 3 x 10  Knee ext 10# 3 x 10 Supine for 65 cm ball , B knee to chest, LTR, alt SLR, bridging all 15x Side lying R for deep cross friction massage L lateral hip musculature with theragun   10/27/23:  Knee ext B,10 # 3 x 10 Knee flex B 35# 3 x 10 In ll bars for heel/toe rocks on airex 15 x In ll bars with blue t band around thighs, for alt hip abd 15 x In ll bars, blue t band around thighs for alt SLR, opp hip ext ,rocking forward and back Nustep L 5 x 6 min x 4 exts Side lying for deep cross friction massage L lateral hip over TFL, glut medius with theragun Supine with 65 cm physioball under feet for B knee to chest, LTR, bridging all 10 x  Seated for brief assessment of R shoulder, B shoulder flexion limited, R to 90, L to 110 B shoulder IR/ER wnl MMT B shoulders with pain and weakness R ER, B shoulder flex and abd   10/08/23:  Eval:  side lying on each side for theragun each hip lateral musculature with improved Sx.  PATIENT EDUCATION:  Education details: exercise rationale, progression and modification  Person educated: Patient  Education method: Explanation, demonstration Education comprehension: verbalized understanding  HOME EXERCISE PROGRAM: TBD  ASSESSMENT:  CLINICAL IMPRESSION:  Pt did report Rt shoulder had increased pain with  noodle pull downs; encouraged pt to get shoulder checked out at dr since pain is persistent and limiting. Pt reported no increase in overall pain during session and did well with STS at lower seat height in water, with out increased pain. Goals are ongoing.   Will continue to progress as tolerated.     Eval: Patient is a 68 y.o. female who  was evaluated today for physical therapy due to chronic pain multiple jts. Presents with postural changes remarkably B knee valgum, also weakness B hips, decreased walking tolerance.  Would benefit from PT with combination of manual techniques, therex, modalities to improve sx and work with her [facility] We discussed aquatic therapy due to multiple jt involvement and she is very interested in pursuing this aspect of PT  OBJECTIVE IMPAIRMENTS: cardiopulmonary status limiting activity, decreased activity tolerance, decreased balance, decreased endurance, decreased knowledge of condition, decreased mobility, difficulty walking, decreased ROM, decreased strength, increased fascial restrictions, impaired perceived functional ability, postural dysfunction, and pain.   ACTIVITY LIMITATIONS: carrying, standing, squatting, stairs, transfers, and locomotion level  PARTICIPATION LIMITATIONS: cleaning, laundry, shopping, community activity, and yard work  PERSONAL FACTORS: Behavior pattern, Time since onset of injury/illness/exacerbation, and 1-2 comorbidities: COPD are also affecting patient's functional outcome.   REHAB POTENTIAL: Good  CLINICAL DECISION MAKING: Evolving/moderate complexity  EVALUATION COMPLEXITY: Moderate   GOALS: Goals reviewed with patient? Yes  SHORT TERM GOALS: Target date: 2 weeks, 10/22/23 I HEP Baseline: Goal status: INITIAL   LONG TERM GOALS: Target date: 01/01/24, 12 weeks  LEFSLower Extremity Functional Score: 35 / 80 = 43.8 % improve to 30%or less disability Baseline:  Goal status:  INITIAL  2.  TUG improve from 18 to 14 or less Baseline:  Goal status: INITIAL  3.  Strength B hip abd, ext 5/5 Baseline: 3+ and 4-/5 Goal status: INITIAL     PLAN:  PT FREQUENCY: 2x/week  PT DURATION: 12 weeks  PLANNED INTERVENTIONS: 97110-Therapeutic exercises, 97530- Therapeutic activity, [id number]- Neuromuscular re-education, 97535- Self Care, [id number]- Manual therapy, [phone]- Aquatic Therapy, and Patient/Family education  PLAN FOR NEXT SESSION: assess response to aquatic therapy session  Delon Aquas, PTA 11/14/23 5:07 PM [facility] [address] [place], [place], [address] Phone: [phone]   Fax:  [phone]

## 2023-11-17 ENCOUNTER — Other Ambulatory Visit (HOSPITAL_COMMUNITY): Payer: Self-pay

## 2023-11-17 ENCOUNTER — Encounter (HOSPITAL_BASED_OUTPATIENT_CLINIC_OR_DEPARTMENT_OTHER): Payer: Self-pay | Admitting: Physical Therapy

## 2023-11-17 ENCOUNTER — Ambulatory Visit (HOSPITAL_BASED_OUTPATIENT_CLINIC_OR_DEPARTMENT_OTHER): Attending: Family Medicine | Admitting: Physical Therapy

## 2023-11-17 DIAGNOSIS — M5459 Other low back pain: Secondary | ICD-10-CM | POA: Diagnosis not present

## 2023-11-17 DIAGNOSIS — R262 Difficulty in walking, not elsewhere classified: Secondary | ICD-10-CM | POA: Diagnosis not present

## 2023-11-17 DIAGNOSIS — R293 Abnormal posture: Secondary | ICD-10-CM | POA: Insufficient documentation

## 2023-11-17 DIAGNOSIS — M25561 Pain in right knee: Secondary | ICD-10-CM | POA: Diagnosis not present

## 2023-11-17 DIAGNOSIS — M25562 Pain in left knee: Secondary | ICD-10-CM | POA: Diagnosis not present

## 2023-11-17 DIAGNOSIS — G8929 Other chronic pain: Secondary | ICD-10-CM | POA: Insufficient documentation

## 2023-11-17 NOTE — Therapy (Signed)
 OUTPATIENT PHYSICAL THERAPY LOWER EXTREMITY TREATMENT   Patient Name: Rose Miller MRN: [id number] DOB:[birth year], 68 y.o., female Today's Date: 11/17/2023  END OF SESSION:  PT End of Session - 11/17/23 0941     Visit Number 8    Date for Recertification  01/01/24    PT Start Time 0930    PT Stop Time 1010    PT Time Calculation (min) 40 min    Activity Tolerance Patient tolerated treatment well    Behavior During Therapy [facility] for tasks assessed/performed           Past Medical History:  Diagnosis Date   Adhesive capsulitis    Aortic valve disease    Arthritis    Asthma    Back pain    CKD (chronic kidney disease) stage 3, GFR 30-59 ml/min (HCC)    Constipation    COPD (chronic obstructive pulmonary disease) (HCC)    Diabetes mellitus without complication (HCC) 10 20 2020   Diuretic-induced hypokalemia    Heart murmur    Hypercholesterolemia    Hypertension    IT band syndrome    Morbid obesity (HCC)    Palpitations    Periumbilical pain    Postmenopausal estrogen deficiency    Prediabetes    S/P TAVR (transcatheter aortic valve replacement) 12/26/2020   Ventral hernia    Past Surgical History:  Procedure Laterality Date   BIOPSY  05/01/2021   Procedure: BIOPSY;  Surgeon: Charlanne Groom, MD;  Location: [facility];  Service: Gastroenterology;;   CARDIAC CATHETERIZATION  11/24/2020   CESAREAN SECTION  1989   COLONOSCOPY     COLONOSCOPY WITH PROPOFOL  N/A 05/01/2021   Procedure: COLONOSCOPY WITH PROPOFOL ;  Surgeon: Charlanne Groom, MD;  Location: [facility];  Service: Gastroenterology;  Laterality: N/A;   ESOPHAGOGASTRODUODENOSCOPY (EGD) WITH PROPOFOL  N/A 05/01/2021   Procedure: ESOPHAGOGASTRODUODENOSCOPY (EGD) WITH PROPOFOL ;  Surgeon: Charlanne Groom, MD;  Location: [facility];  Service: Gastroenterology;  Laterality: N/A;   INTRAOPERATIVE TRANSTHORACIC ECHOCARDIOGRAM N/A 12/26/2020   Procedure: INTRAOPERATIVE TRANSTHORACIC ECHOCARDIOGRAM;  Surgeon:  Wendel Lurena POUR, MD;  Location: [facility];  Service: Open Heart Surgery;  Laterality: N/A;   MALONEY DILATION  05/01/2021   Procedure: AGAPITO DILATION;  Surgeon: Charlanne Groom, MD;  Location: [facility];  Service: Gastroenterology;;   POLYPECTOMY  05/01/2021   Procedure: POLYPECTOMY;  Surgeon: Charlanne Groom, MD;  Location: [facility];  Service: Gastroenterology;;   RIGHT/LEFT HEART CATH AND CORONARY ANGIOGRAPHY N/A 11/24/2020   Procedure: RIGHT/LEFT HEART CATH AND CORONARY ANGIOGRAPHY;  Surgeon: Claudene Victory ORN, MD;  Location: [facility];  Service: Cardiovascular;  Laterality: N/A;   TONSILLECTOMY AND ADENOIDECTOMY  1960   TRANSCATHETER AORTIC VALVE REPLACEMENT, TRANSFEMORAL N/A 12/26/2020   Procedure: TRANSCATHETER AORTIC VALVE REPLACEMENT, TRANSFEMORAL;  Surgeon: Wendel Lurena POUR, MD;  Location: [facility];  Service: Open Heart Surgery;  Laterality: N/A;   Patient Active Problem List   Diagnosis Date Noted   Acquired hammer toe of right foot 09/26/2023   Atherosclerosis of artery of both lower extremities 09/26/2023   Polyneuropathy due to type 2 diabetes mellitus (HCC) 09/26/2023   Primary localized osteoarthrosis of ankle and foot 09/26/2023   Kidney lesion 04/14/2023   Abscess of lower back 03/17/2023   Ingrown nail of great toe of left foot 12/16/2022   Vitamin D  deficiency 12/16/2022   Low back pain 12/16/2022   Chronic pain of both knees 12/16/2022   Dyspnea on exertion 12/16/2022   Coronary artery disease due  to calcified coronary lesion 12/16/2022   Aortic atherosclerosis 12/16/2022   PAF (paroxysmal atrial fibrillation) (HCC) 03/14/2022   Otalgia of left ear 03/14/2022   Diplopia 09/12/2021   Allergic rhinitis 09/12/2021   Pharyngoesophageal dysphagia    Family history of colon cancer in father    Aortic valve disease 12/26/2020   COPD (chronic obstructive pulmonary disease) (HCC) 12/26/2020   Postmenopausal estrogen deficiency 02/18/2019    Former smoker 05/13/2018   Long-term use of aspirin  therapy 05/13/2018   Peripheral vascular disease 05/05/2018   Prediabetes 05/05/2018   Type 2 diabetes mellitus with obesity 03/31/2017   CKD stage 3 secondary to diabetes (HCC) 03/31/2017   Hypercholesterolemia 06/28/2016   Aortic valve stenosis 03/29/2016   Periumbilical pain 03/29/2016   IT band syndrome 09/11/2014   Right shoulder pain 09/11/2014   Ventral hernia 05/01/2014   Morbid obesity (HCC) 03/17/2014    PCP: Sebastian Righter MD  REFERRING PROVIDER: same  REFERRING DIAG: lower back, B knee, L hip pain  THERAPY DIAG:  Difficulty in walking, not elsewhere classified  Other low back pain  Chronic pain of both knees  Abnormal posture  Rationale for Evaluation and Treatment: Rehabilitation  ONSET DATE: December 2024  SUBJECTIVE:   SUBJECTIVE STATEMENT: Pt reports continued soreness, but she stated that her soreness wasn't as bad the day following the last session.  I feel like the exercises are getting easier than before.   POOL ACCESS:  Pt is member of [employer].    PERTINENT HISTORY: Referred by PCP due to pain complaints knees, hips lower back PAIN:  Are you having pain? Yes: NPRS scale: 3/10 Pain location: lateral hips, B knees, lower back  --(and Rt shoulder 4/10) Pain description: deep pain with walking Aggravating factors: walking Relieving factors: resting  PRECAUTIONS: Fall  RED FLAGS: None   WEIGHT BEARING RESTRICTIONS: No  FALLS:  Has patient fallen in last 6 months? No  LIVING ENVIRONMENT: Lives with: lives with their son Lives in: House/apartment Stairs: steps outdoors Has following equipment at home: None  OCCUPATION: retired  PLOF: Independent  PATIENT GOALS: be able to walk without pain  NEXT MD VISIT: 1 month  OBJECTIVE:  Note: Objective measures were completed at Evaluation unless otherwise noted.  DIAGNOSTIC FINDINGS: na  PATIENT SURVEYS:  Lower Extremity Functional  Score: 35 / 80 = 43.8 %  COGNITION: Overall cognitive status: Within functional limits for tasks assessed     SENSATION: WFL    MUSCLE LENGTH: Hamstrings: Right wfl deg; Left wfl deg Thomas test: Right nt deg; Left  nt deg  POSTURE: marked valgum B knees, greater on R  PALPATION: Tender L lateral hip musculature, TFL Tender R glut medius, minimus,  LOWER EXTREMITY ROM:  Passive ROM Right eval Left eval  Hip flexion 105 95  Hip extension    Hip abduction    Hip adduction    Hip internal rotation    Hip external rotation    Knee flexion 112 102  Knee extension -3 -12  Ankle dorsiflexion    Ankle plantarflexion    Ankle inversion    Ankle eversion     (Blank rows = not tested)  LOWER EXTREMITY MMT:  MMT Right eval Left eval  Hip flexion 4- 4-  Hip extension    Hip abduction 3+ 3-  Hip adduction    Hip internal rotation    Hip external rotation    Knee flexion    Knee extension    Ankle dorsiflexion  Ankle plantarflexion 4- 4-  Ankle inversion    Ankle eversion     (Blank rows = not tested)   FUNCTIONAL TESTS:  Timed up and go (TUG): 18.35  GAIT: Distance walked: 64' in clinic, no device, labored gait, slowed cadence decreased stride length B                                                                                                                              TREATMENT DATE:  [facility] Adult PT Treatment:                                             Date: 11/17/2023 Pt seen for aquatic therapy today.  Treatment took place in water 3.5-4.75 ft in depth at the [facility] pool. Temp of water was 91.  Pt entered/exited the pool via stairs independently with bil rail.  - unsupported:  walking/backward x 3 laps with reciprocal row motion, side stepping with arm add/abdct with short hollow noodles x 3 laps - squatted rest at wall - UE on wall: alternating hamstring curls x 10 each LE; single leg clams x 10 each (improved); toe/heel raises x 20;  hip flexion /extension 2x10; hip abdct/ add 2x10; squatted rest; hip crosses x 10 each - UE on yellow hand floats: forward walking kicks  - suitcase carry at 4 ft, with bil short hollow noodles under water -> single rainbow hand float at side, walking forward/backward - STS from 3rd step in water, x 5 with cues for neutral head, core engaged, forward arm reach  [facility] Adult PT Treatment:                                             Date: 11/14/2023 Pt seen for aquatic therapy today.  Treatment took place in water 3.5-4.75 ft in depth at the [facility] pool. Temp of water was 91.  Pt entered/exited the pool via stairs independently with bil rail.  - unsupported:  walking/backward x 3 laps with reciprocal row motion, side stepping 2 laps with horiz abdct/add; side stepping with arm add/abdct with short hollow noodles x 1 lap - UE on wall: alternating hamstring curls x 10 each LE; toe/heel raises x 20;  single leg clams x 10 each (limited ROM); hip crosses x 10 each; hip abdct/ add 2x10 - return to walking with relaxed arms - UE on yellow hand floats: forward walking kicks  - marching with reciprocal row motion with short hollow noodles -> forward walking kicks - TrA set with short hollow noodle pull down to thighs x 3 -> long hollow noodle x 8 (pain in R shoulder) - STS at bench in water with feet on blue step, x  10 with cues for neutral head and core engaged    [facility] Adult PT Treatment:                                             Date: 11/07/2023 Pt seen for aquatic therapy today.  Treatment took place in water 3.5-4.75 ft in depth at the [facility] pool. Temp of water was 91.  Pt entered/exited the pool via stairs independently with bil rail.  - unsupported:  walking/backward x 3 laps, side stepping 2 laps -side stepping with arm add/abdct with short hollow noodles x 1 lap - marching with reciprocal row motion with short hollow noodles x 2 laps - squatted rest at wall - UE  on wall:  toe/heel raises x 15; hip add/abdct x10 ; hip flexion /extension x10;  - return to walking forward/ backward 2 laps  -UE on wall - 10 squats  - TrA set with short hollow noodle pull down to thighs x 10; in staggered stance x 5 each (good balance challenge) - at rails:  L stretch with knees bent x 20sec, 2nd rep with gentle tail wag;  L/R hamstring stretch with foot on 2nd step standing up straight x 20sec each  [facility] Adult PT Treatment:                                             Date: 11/05/2023 Pt seen for aquatic therapy today.  Treatment took place in water 3.5-4.75 ft in depth at the [facility] pool. Temp of water was 91.  Pt entered/exited the pool via stairs independently with bil rail.  - Intro to aquatic therapy principles - UE on barbell:  walking forward x 3 laps, side stepping 2 laps, backward 2 laps - squatted rest at wall - UE on yellow noodle: walking forward/ backward 2 laps, side stepping 1 lap - UE on yellow hand floats: high knee marching backward/ forward and sideways - UE on wall:  toe/heel raises x 10; hip add/abdct x10 ; hip flexion /extension x10;  -squatted rest at wall - TrA set with short hollow noodle pull down to thighs x 10  - yellow noodle under arms with UE on wall-> white noodle under arms with UE on corner: cycling (difficulty maintaining neutral trunk)  10/29/23:  Nustep L 5 x 7 min x 4 exts  Yellow t band B shoulder ER  Yellow t  band rows band under feet B In ll bars with blue t band around thighs, for alt hip abd 15 x In ll bars, blue t band around thighs for alt SLR, opp hip ext ,rocking forward and back Knee flexion B 35# 3 x 10  Knee ext 10# 3 x 10 Supine for 65 cm ball , B knee to chest, LTR, alt SLR, bridging all 15x Side lying R for deep cross friction massage L lateral hip musculature with theragun   10/27/23:  Knee ext B,10 # 3 x 10 Knee flex B 35# 3 x 10 In ll bars for heel/toe rocks on airex 15 x In ll bars with  blue t band around thighs, for alt hip abd 15 x In ll bars, blue t band around thighs for alt SLR, opp hip ext ,rocking forward and back  Nustep L 5 x 6 min x 4 exts Side lying for deep cross friction massage L lateral hip over TFL, glut medius with theragun Supine with 65 cm physioball under feet for B knee to chest, LTR, bridging all 10 x  Seated for brief assessment of R shoulder, B shoulder flexion limited, R to 90, L to 110 B shoulder IR/ER wnl MMT B shoulders with pain and weakness R ER, B shoulder flex and abd   10/08/23:  Eval:  side lying on each side for theragun each hip lateral musculature with improved Sx.  PATIENT EDUCATION:  Education details: exercise rationale, progression and modification  Person educated: Patient  Education method: Explanation, demonstration Education comprehension: verbalized understanding  HOME EXERCISE PROGRAM: TBD  ASSESSMENT:  CLINICAL IMPRESSION:  Pt tolerating aquatic exercises with greater ease this visit than in previous.  Hip IR/ER with single clams improved from last session. She reported decrease in overall pain when submerged 80%. Goals are ongoing.   Will continue to progress as tolerated. Encouraged pt to explore [employer] pool as there may be a few weeks before her next scheduled pool appt, and it would be beneficial for her to complete basic aquatic HEP during that time.     Eval: Patient is a 68 y.o. female who was evaluated today for physical therapy due to chronic pain multiple jts. Presents with postural changes remarkably B knee valgum, also weakness B hips, decreased walking tolerance.  Would benefit from PT with combination of manual techniques, therex, modalities to improve sx and work with [facility] We discussed aquatic therapy due to multiple jt involvement and she is very interested in pursuing this aspect of PT  OBJECTIVE IMPAIRMENTS: cardiopulmonary status limiting activity, decreased  activity tolerance, decreased balance, decreased endurance, decreased knowledge of condition, decreased mobility, difficulty walking, decreased ROM, decreased strength, increased fascial restrictions, impaired perceived functional ability, postural dysfunction, and pain.   ACTIVITY LIMITATIONS: carrying, standing, squatting, stairs, transfers, and locomotion level  PARTICIPATION LIMITATIONS: cleaning, laundry, shopping, community activity, and yard work  PERSONAL FACTORS: Behavior pattern, Time since onset of injury/illness/exacerbation, and 1-2 comorbidities: COPD are also affecting patient's functional outcome.   REHAB POTENTIAL: Good  CLINICAL DECISION MAKING: Evolving/moderate complexity  EVALUATION COMPLEXITY: Moderate   GOALS: Goals reviewed with patient? Yes  SHORT TERM GOALS: Target date: 2 weeks, 10/22/23 I HEP Baseline: Goal status: INITIAL   LONG TERM GOALS: Target date: 01/01/24, 12 weeks  LEFSLower Extremity Functional Score: 35 / 80 = 43.8 % improve to 30%or less disability Baseline:  Goal status: INITIAL  2.  TUG improve from 18 to 14 or less Baseline:  Goal status: INITIAL  3.  Strength B hip abd, ext 5/5 Baseline: 3+ and 4-/5 Goal status: INITIAL     PLAN:  PT FREQUENCY: 2x/week  PT DURATION: 12 weeks  PLANNED INTERVENTIONS: 97110-Therapeutic exercises, 97530- Therapeutic activity, [id number]- Neuromuscular re-education, 97535- Self Care, [id number]- Manual therapy, [phone]- Aquatic Therapy, and Patient/Family education  PLAN FOR NEXT SESSION: assess response to aquatic therapy session  Delon Aquas, PTA 11/17/23 10:31 AM [facility] [address] [place], [place], [address] Phone: [phone]   Fax:  [phone]

## 2023-11-19 ENCOUNTER — Encounter (HOSPITAL_BASED_OUTPATIENT_CLINIC_OR_DEPARTMENT_OTHER): Payer: Self-pay | Admitting: Physical Therapy

## 2023-11-19 ENCOUNTER — Ambulatory Visit (HOSPITAL_BASED_OUTPATIENT_CLINIC_OR_DEPARTMENT_OTHER): Admitting: Physical Therapy

## 2023-11-19 DIAGNOSIS — M19011 Primary osteoarthritis, right shoulder: Secondary | ICD-10-CM | POA: Diagnosis not present

## 2023-11-19 DIAGNOSIS — M170 Bilateral primary osteoarthritis of knee: Secondary | ICD-10-CM | POA: Diagnosis not present

## 2023-11-19 DIAGNOSIS — M5137 Other intervertebral disc degeneration, lumbosacral region with discogenic back pain only: Secondary | ICD-10-CM | POA: Diagnosis not present

## 2023-11-19 DIAGNOSIS — G8929 Other chronic pain: Secondary | ICD-10-CM

## 2023-11-19 DIAGNOSIS — R262 Difficulty in walking, not elsewhere classified: Secondary | ICD-10-CM | POA: Diagnosis not present

## 2023-11-19 DIAGNOSIS — M19012 Primary osteoarthritis, left shoulder: Secondary | ICD-10-CM | POA: Diagnosis not present

## 2023-11-19 DIAGNOSIS — R293 Abnormal posture: Secondary | ICD-10-CM

## 2023-11-19 DIAGNOSIS — M5459 Other low back pain: Secondary | ICD-10-CM

## 2023-11-19 NOTE — Therapy (Signed)
 OUTPATIENT PHYSICAL THERAPY LOWER EXTREMITY TREATMENT   Patient Name: Rose Miller MRN: [id number] DOB:1955-08-30, 68 y.o., female Today's Date: 11/19/2023  END OF SESSION:  PT End of Session - 11/19/23 1206     Visit Number 9    Date for Recertification  01/01/24    PT Start Time 1155    PT Stop Time 1233    PT Time Calculation (min) 38 min    Behavior During Therapy [facility] for tasks assessed/performed           Past Medical History:  Diagnosis Date   Adhesive capsulitis    Aortic valve disease    Arthritis    Asthma    Back pain    CKD (chronic kidney disease) stage 3, GFR 30-59 ml/min (HCC)    Constipation    COPD (chronic obstructive pulmonary disease) (HCC)    Diabetes mellitus without complication (HCC) 10 20 2020   Diuretic-induced hypokalemia    Heart murmur    Hypercholesterolemia    Hypertension    IT band syndrome    Morbid obesity (HCC)    Palpitations    Periumbilical pain    Postmenopausal estrogen deficiency    Prediabetes    S/P TAVR (transcatheter aortic valve replacement) 12/26/2020   Ventral hernia    Past Surgical History:  Procedure Laterality Date   BIOPSY  05/01/2021   Procedure: BIOPSY;  Surgeon: Charlanne Groom, MD;  Location: [facility];  Service: Gastroenterology;;   CARDIAC CATHETERIZATION  11/24/2020   CESAREAN SECTION  1989   COLONOSCOPY     COLONOSCOPY WITH PROPOFOL  N/A 05/01/2021   Procedure: COLONOSCOPY WITH PROPOFOL ;  Surgeon: Charlanne Groom, MD;  Location: [facility];  Service: Gastroenterology;  Laterality: N/A;   ESOPHAGOGASTRODUODENOSCOPY (EGD) WITH PROPOFOL  N/A 05/01/2021   Procedure: ESOPHAGOGASTRODUODENOSCOPY (EGD) WITH PROPOFOL ;  Surgeon: Charlanne Groom, MD;  Location: [facility];  Service: Gastroenterology;  Laterality: N/A;   INTRAOPERATIVE TRANSTHORACIC ECHOCARDIOGRAM N/A 12/26/2020   Procedure: INTRAOPERATIVE TRANSTHORACIC ECHOCARDIOGRAM;  Surgeon: Wendel Lurena POUR, MD;  Location: [facility];  Service:  Open Heart Surgery;  Laterality: N/A;   MALONEY DILATION  05/01/2021   Procedure: AGAPITO DILATION;  Surgeon: Charlanne Groom, MD;  Location: [facility];  Service: Gastroenterology;;   POLYPECTOMY  05/01/2021   Procedure: POLYPECTOMY;  Surgeon: Charlanne Groom, MD;  Location: [facility];  Service: Gastroenterology;;   RIGHT/LEFT HEART CATH AND CORONARY ANGIOGRAPHY N/A 11/24/2020   Procedure: RIGHT/LEFT HEART CATH AND CORONARY ANGIOGRAPHY;  Surgeon: Claudene Victory ORN, MD;  Location: [facility] INVASIVE CV LAB;  Service: Cardiovascular;  Laterality: N/A;   TONSILLECTOMY AND ADENOIDECTOMY  1960   TRANSCATHETER AORTIC VALVE REPLACEMENT, TRANSFEMORAL N/A 12/26/2020   Procedure: TRANSCATHETER AORTIC VALVE REPLACEMENT, TRANSFEMORAL;  Surgeon: Wendel Lurena POUR, MD;  Location: [facility];  Service: Open Heart Surgery;  Laterality: N/A;   Patient Active Problem List   Diagnosis Date Noted   Acquired hammer toe of right foot 09/26/2023   Atherosclerosis of artery of both lower extremities 09/26/2023   Polyneuropathy due to type 2 diabetes mellitus (HCC) 09/26/2023   Primary localized osteoarthrosis of ankle and foot 09/26/2023   Kidney lesion 04/14/2023   Abscess of lower back 03/17/2023   Ingrown nail of great toe of left foot 12/16/2022   Vitamin D  deficiency 12/16/2022   Low back pain 12/16/2022   Chronic pain of both knees 12/16/2022   Dyspnea on exertion 12/16/2022   Coronary artery disease due to calcified coronary lesion 12/16/2022   Aortic atherosclerosis  12/16/2022   PAF (paroxysmal atrial fibrillation) (HCC) 03/14/2022   Otalgia of left ear 03/14/2022   Diplopia 09/12/2021   Allergic rhinitis 09/12/2021   Pharyngoesophageal dysphagia    Family history of colon cancer in father    Aortic valve disease 12/26/2020   COPD (chronic obstructive pulmonary disease) (HCC) 12/26/2020   Postmenopausal estrogen deficiency 02/18/2019   Former smoker 05/13/2018   Long-term use of aspirin  therapy  05/13/2018   Peripheral vascular disease 05/05/2018   Prediabetes 05/05/2018   Type 2 diabetes mellitus with obesity 03/31/2017   CKD stage 3 secondary to diabetes (HCC) 03/31/2017   Hypercholesterolemia 06/28/2016   Aortic valve stenosis 03/29/2016   Periumbilical pain 03/29/2016   IT band syndrome 09/11/2014   Right shoulder pain 09/11/2014   Ventral hernia 05/01/2014   Morbid obesity (HCC) 03/17/2014    PCP: Sebastian Righter MD  REFERRING PROVIDER: same  REFERRING DIAG: lower back, B knee, L hip pain  THERAPY DIAG:  Difficulty in walking, not elsewhere classified  Other low back pain  Chronic pain of both knees  Abnormal posture  Rationale for Evaluation and Treatment: Rehabilitation  ONSET DATE: December 2024  SUBJECTIVE:   SUBJECTIVE STATEMENT: Pt reports continued soreness, but she stated that her soreness wasn't as bad the day following the last session.  I feel like the exercises are getting easier than before.   POOL ACCESS:  Pt is member of [employer].    PERTINENT HISTORY: Referred by PCP due to pain complaints knees, hips lower back PAIN:  Are you having pain? Yes: NPRS scale: 3/10 Pain location: lateral hips, B knees, lower back  --(and Rt shoulder 4/10) Pain description: deep pain with walking Aggravating factors: walking Relieving factors: resting  PRECAUTIONS: Fall  RED FLAGS: None   WEIGHT BEARING RESTRICTIONS: No  FALLS:  Has patient fallen in last 6 months? No  LIVING ENVIRONMENT: Lives with: lives with their son Lives in: House/apartment Stairs: steps outdoors Has following equipment at home: None  OCCUPATION: retired  PLOF: Independent  PATIENT GOALS: be able to walk without pain  NEXT MD VISIT: 1 month  OBJECTIVE:  Note: Objective measures were completed at Evaluation unless otherwise noted.  DIAGNOSTIC FINDINGS: na  PATIENT SURVEYS:  Lower Extremity Functional Score: 35 / 80 = 43.8 %  COGNITION: Overall cognitive  status: Within functional limits for tasks assessed     SENSATION: WFL    MUSCLE LENGTH: Hamstrings: Right wfl deg; Left wfl deg Thomas test: Right nt deg; Left  nt deg  POSTURE: marked valgum B knees, greater on R  PALPATION: Tender L lateral hip musculature, TFL Tender R glut medius, minimus,  LOWER EXTREMITY ROM:  Passive ROM Right eval Left eval  Hip flexion 105 95  Hip extension    Hip abduction    Hip adduction    Hip internal rotation    Hip external rotation    Knee flexion 112 102  Knee extension -3 -12  Ankle dorsiflexion    Ankle plantarflexion    Ankle inversion    Ankle eversion     (Blank rows = not tested)  LOWER EXTREMITY MMT:  MMT Right eval Left eval  Hip flexion 4- 4-  Hip extension    Hip abduction 3+ 3-  Hip adduction    Hip internal rotation    Hip external rotation    Knee flexion    Knee extension    Ankle dorsiflexion    Ankle plantarflexion 4- 4-  Ankle inversion  Ankle eversion     (Blank rows = not tested)   FUNCTIONAL TESTS:  Timed up and go (TUG): 18.35  GAIT: Distance walked: 68' in clinic, no device, labored gait, slowed cadence decreased stride length B                                                                                                                              TREATMENT DATE:  [facility] Adult PT Treatment:                                             Date: 11/19/2023 Pt seen for aquatic therapy today.  Treatment took place in water 3.5-4.75 ft in depth at the [facility] pool. Temp of water was 91.  Pt entered/exited the pool via stairs independently with bil rail.  - unsupported:  walking/backward x 4 laps with reciprocal row motion, - side stepping with arm add/abdct with short hollow noodles x 2 laps - TrA set with short hollow noodle pull down to thighs in staggered stance x 5 each - suitcase carry at 4 ft, with bil short hollow noodles under water -> single rainbow hand float at side,  walking forward/backward - UE on yellow hand floats: toe/heel raises x 15; alternating hamstring curls x 10 each LE; hip abdct/ add 3x5; hip crosses x 10 each - short rest in squatted position - UE on wall: single leg clams x 10 each; (at 6ft6) wide leg, side to side lunge hold for adductor stretch; (at 80ft 8) hip flexion /extension x10; hip circles x 10 (5 CW/5CCW)  - forward walking kick - STS from 3rd step in water, x 5 with cues for neutral head, core engaged, forward arm reach  [facility] Adult PT Treatment:                                             Date: 11/17/2023 Pt seen for aquatic therapy today.  Treatment took place in water 3.5-4.75 ft in depth at the [facility] pool. Temp of water was 91.  Pt entered/exited the pool via stairs independently with bil rail.  - unsupported:  walking/backward x 3 laps with reciprocal row motion, side stepping with arm add/abdct with short hollow noodles x 3 laps - squatted rest at wall - UE on wall: alternating hamstring curls x 10 each LE; single leg clams x 10 each (improved); toe/heel raises x 20; hip flexion /extension 2x10; hip abdct/ add 2x10; squatted rest; hip crosses x 10 each - UE on yellow hand floats: forward walking kicks  - suitcase carry at 4 ft, with bil short hollow noodles under water -> single rainbow hand float at side, walking forward/backward - STS from 3rd  step in water, x 5 with cues for neutral head, core engaged, forward arm reach  [facility] Adult PT Treatment:                                             Date: 11/14/2023 Pt seen for aquatic therapy today.  Treatment took place in water 3.5-4.75 ft in depth at the [facility] pool. Temp of water was 91.  Pt entered/exited the pool via stairs independently with bil rail.  - unsupported:  walking/backward x 3 laps with reciprocal row motion, side stepping 2 laps with horiz abdct/add; side stepping with arm add/abdct with short hollow noodles x 1 lap - UE on wall:  alternating hamstring curls x 10 each LE; toe/heel raises x 20;  single leg clams x 10 each (limited ROM); hip crosses x 10 each; hip abdct/ add 2x10 - return to walking with relaxed arms - UE on yellow hand floats: forward walking kicks  - marching with reciprocal row motion with short hollow noodles -> forward walking kicks - TrA set with short hollow noodle pull down to thighs x 3 -> long hollow noodle x 8 (pain in R shoulder) - STS at bench in water with feet on blue step, x 10 with cues for neutral head and core engaged    [facility] Adult PT Treatment:                                             Date: 11/07/2023 Pt seen for aquatic therapy today.  Treatment took place in water 3.5-4.75 ft in depth at the [facility] pool. Temp of water was 91.  Pt entered/exited the pool via stairs independently with bil rail.  - unsupported:  walking/backward x 3 laps, side stepping 2 laps -side stepping with arm add/abdct with short hollow noodles x 1 lap - marching with reciprocal row motion with short hollow noodles x 2 laps - squatted rest at wall - UE on wall:  toe/heel raises x 15; hip add/abdct x10 ; hip flexion /extension x10;  - return to walking forward/ backward 2 laps  -UE on wall - 10 squats  - TrA set with short hollow noodle pull down to thighs x 10; in staggered stance x 5 each (good balance challenge) - at rails:  L stretch with knees bent x 20sec, 2nd rep with gentle tail wag;  L/R hamstring stretch with foot on 2nd step standing up straight x 20sec each  [facility] Adult PT Treatment:                                             Date: 11/05/2023 Pt seen for aquatic therapy today.  Treatment took place in water 3.5-4.75 ft in depth at the [facility] pool. Temp of water was 91.  Pt entered/exited the pool via stairs independently with bil rail.  - Intro to aquatic therapy principles - UE on barbell:  walking forward x 3 laps, side stepping 2 laps, backward 2 laps - squatted  rest at wall - UE on yellow noodle: walking forward/ backward 2 laps, side stepping 1 lap - UE on yellow hand  floats: high knee marching backward/ forward and sideways - UE on wall:  toe/heel raises x 10; hip add/abdct x10 ; hip flexion /extension x10;  -squatted rest at wall - TrA set with short hollow noodle pull down to thighs x 10  - yellow noodle under arms with UE on wall-> white noodle under arms with UE on corner: cycling (difficulty maintaining neutral trunk)  10/29/23:  Nustep L 5 x 7 min x 4 exts  Yellow t band B shoulder ER  Yellow t  band rows band under feet B In ll bars with blue t band around thighs, for alt hip abd 15 x In ll bars, blue t band around thighs for alt SLR, opp hip ext ,rocking forward and back Knee flexion B 35# 3 x 10  Knee ext 10# 3 x 10 Supine for 65 cm ball , B knee to chest, LTR, alt SLR, bridging all 15x Side lying R for deep cross friction massage L lateral hip musculature with theragun   10/27/23:  Knee ext B,10 # 3 x 10 Knee flex B 35# 3 x 10 In ll bars for heel/toe rocks on airex 15 x In ll bars with blue t band around thighs, for alt hip abd 15 x In ll bars, blue t band around thighs for alt SLR, opp hip ext ,rocking forward and back Nustep L 5 x 6 min x 4 exts Side lying for deep cross friction massage L lateral hip over TFL, glut medius with theragun Supine with 65 cm physioball under feet for B knee to chest, LTR, bridging all 10 x  Seated for brief assessment of R shoulder, B shoulder flexion limited, R to 90, L to 110 B shoulder IR/ER wnl MMT B shoulders with pain and weakness R ER, B shoulder flex and abd   10/08/23:  Eval:  side lying on each side for theragun each hip lateral musculature with improved Sx.  PATIENT EDUCATION:  Education details: exercise rationale, progression and modification  Person educated: Patient  Education method: Explanation, demonstration Education comprehension: verbalized understanding  HOME  EXERCISE PROGRAM: TBD  ASSESSMENT:  CLINICAL IMPRESSION:  Good tolerance for progression of exercise with less UE support to improve core engagement and balance.    Will continue to progress as tolerated. Mild discomfort in Rt shoulder persists with movement of RUE, but otherwise no increase in pain in lower back and knees.  Patient was seen in aquatics on 11/19/23 for visit #9.  Patient is in need of a 10# visit progress note to be completed by evaluating PT in a land-based setting. Information was sent to evaluating PT and site supervisor via Personnel Officer.    Eval: Patient is a 68 y.o. female who was evaluated today for physical therapy due to chronic pain multiple jts. Presents with postural changes remarkably B knee valgum, also weakness B hips, decreased walking tolerance.  Would benefit from PT with combination of manual techniques, therex, modalities to improve sx and work with her [facility] We discussed aquatic therapy due to multiple jt involvement and she is very interested in pursuing this aspect of PT  OBJECTIVE IMPAIRMENTS: cardiopulmonary status limiting activity, decreased activity tolerance, decreased balance, decreased endurance, decreased knowledge of condition, decreased mobility, difficulty walking, decreased ROM, decreased strength, increased fascial restrictions, impaired perceived functional ability, postural dysfunction, and pain.   ACTIVITY LIMITATIONS: carrying, standing, squatting, stairs, transfers, and locomotion level  PARTICIPATION LIMITATIONS: cleaning, laundry,  shopping, community activity, and yard work  PERSONAL FACTORS: Behavior pattern, Time since onset of injury/illness/exacerbation, and 1-2 comorbidities: COPD are also affecting patient's functional outcome.   REHAB POTENTIAL: Good  CLINICAL DECISION MAKING: Evolving/moderate complexity  EVALUATION COMPLEXITY: Moderate   GOALS: Goals reviewed with  patient? Yes  SHORT TERM GOALS: Target date: 2 weeks, 10/22/23 I HEP Baseline: Goal status: INITIAL   LONG TERM GOALS: Target date: 01/01/24, 12 weeks  LEFSLower Extremity Functional Score: 35 / 80 = 43.8 % improve to 30%or less disability Baseline:  Goal status: INITIAL  2.  TUG improve from 18 to 14 or less Baseline:  Goal status: INITIAL  3.  Strength B hip abd, ext 5/5 Baseline: 3+ and 4-/5 Goal status: INITIAL     PLAN:  PT FREQUENCY: 2x/week  PT DURATION: 12 weeks  PLANNED INTERVENTIONS: 97110-Therapeutic exercises, 97530- Therapeutic activity, [id number]- Neuromuscular re-education, 97535- Self Care, [id number]- Manual therapy, [phone]- Aquatic Therapy, and Patient/Family education  PLAN FOR NEXT SESSION: assess response to aquatic therapy session  Delon Aquas, PTA 11/19/23 12:58 PM [facility] [address] [place], [place], [address] Phone: [phone]   Fax:  [phone]

## 2023-11-19 NOTE — Progress Notes (Unsigned)
   11/20/2023  Patient ID: Rose Miller, female   DOB: 24-Nov-1955, 68 y.o.   MRN: [id number]  Subjective/Objective: Telephone visit to follow-up on management of chronic conditions  Diabetes -Current medications:  Ozempic  2mg  weekly, Jardiance  10mg  daily -Patient receives Ozempic  2mg  through Nov[employer] PAP, and is down to 1 pen on hand -Receives Jardiance  through [employer] PAP  -Patient does monitor home FBG daily; recent values:  111, 94, 92, 114, 112, 96, 120 -Recent A1c of 6% on 9/4 -UACR 5.6 on 9/4 -Does not endorse s/sx of hypoglycemia or hyperglycemia   Hypertension -Current medications:  amlodpine 10mg  daily, losartan  100mg  daily, metoprolol  xl 25mg  daily, spironolactone  25mg  daily, hydralazine  50mg  TID, furosemide  20mg  daily as needed (rarely), diltiazem  30mg  q6h prn (rarely) -Patient monitors home BP regularly; recent readings:  131/68 ,115/62, 129/66, 123/69, 135/74 -Last OV BP of 126/68 on 9/4 -Does not endorse any s/sx of hypotension or hypertension   Hyperlipidemia -Current medications:  atorvastatin  10mg  daily -LDL 58, TG 97 on 9/4   Assessment/Plan:   Diabetes -Currently controlled -Continue current regimen at this time -Continue to monitor FBG daily -Informed patient of upcoming Novo PAP changes; she does not qualify for LIS Medicare Extra Help based on HHI, so she plans to look at different plan options during open enrollment -Coordinating with medication assistance team and PCP to work on reorder form, so she can hopefully get another pen or 2 before EOY   Hypertension -Currently controlled -Continue current regimen and regular monitoring of BP   Hyperlipidemia -Currently controlled -Continue current regimen and regular follow-up with PCP    Rose Miller, PharmD, DPLA

## 2023-11-20 ENCOUNTER — Telehealth: Payer: Self-pay

## 2023-11-20 ENCOUNTER — Other Ambulatory Visit: Payer: Self-pay

## 2023-11-20 ENCOUNTER — Other Ambulatory Visit

## 2023-11-20 DIAGNOSIS — E669 Obesity, unspecified: Secondary | ICD-10-CM

## 2023-11-20 DIAGNOSIS — E7800 Pure hypercholesterolemia, unspecified: Secondary | ICD-10-CM

## 2023-11-20 NOTE — Telephone Encounter (Signed)
 Completed refill form for Ozempic  and faxed to provider's office for review and signature.

## 2023-11-21 ENCOUNTER — Encounter: Admitting: Obstetrics and Gynecology

## 2023-11-21 ENCOUNTER — Other Ambulatory Visit (HOSPITAL_COMMUNITY): Payer: Self-pay

## 2023-11-21 ENCOUNTER — Other Ambulatory Visit: Payer: Self-pay | Admitting: Cardiovascular Disease

## 2023-11-21 ENCOUNTER — Other Ambulatory Visit: Payer: Self-pay

## 2023-11-21 DIAGNOSIS — I480 Paroxysmal atrial fibrillation: Secondary | ICD-10-CM

## 2023-11-21 MED ORDER — APIXABAN 5 MG PO TABS
5.0000 mg | ORAL_TABLET | Freq: Two times a day (BID) | ORAL | 2 refills | Status: AC
  Filled 2023-11-21: qty 180, 90d supply, fill #0
  Filled 2024-02-19: qty 180, 90d supply, fill #1

## 2023-11-21 NOTE — Therapy (Addendum)
 OUTPATIENT PHYSICAL THERAPY LOWER EXTREMITY TREATMENT Progress Note Reporting Period 10/08/23 to 11/24/23  See note below for Objective Data and Assessment of Progress/Goals.      Patient Name: Rose Miller MRN: [id number] DOB:09-04-1955, 68 y.o., female Today's Date: 11/24/2023  END OF SESSION:  PT End of Session - 11/24/23 0850     Visit Number 10    Date for Recertification  01/01/24    PT Start Time 0850    PT Stop Time 0930    PT Time Calculation (min) 40 min    Behavior During Therapy [facility] for tasks assessed/performed            Past Medical History:  Diagnosis Date   Adhesive capsulitis    Aortic valve disease    Arthritis    Asthma    Back pain    CKD (chronic kidney disease) stage 3, GFR 30-59 ml/min (HCC)    Constipation    COPD (chronic obstructive pulmonary disease) (HCC)    Diabetes mellitus without complication (HCC) 10 20 2020   Diuretic-induced hypokalemia    Heart murmur    Hypercholesterolemia    Hypertension    IT band syndrome    Morbid obesity (HCC)    Palpitations    Periumbilical pain    Postmenopausal estrogen deficiency    Prediabetes    S/P TAVR (transcatheter aortic valve replacement) 12/26/2020   Ventral hernia    Past Surgical History:  Procedure Laterality Date   BIOPSY  05/01/2021   Procedure: BIOPSY;  Surgeon: Charlanne Groom, MD;  Location: [facility];  Service: Gastroenterology;;   CARDIAC CATHETERIZATION  11/24/2020   CESAREAN SECTION  1989   COLONOSCOPY     COLONOSCOPY WITH PROPOFOL  N/A 05/01/2021   Procedure: COLONOSCOPY WITH PROPOFOL ;  Surgeon: Charlanne Groom, MD;  Location: [facility];  Service: Gastroenterology;  Laterality: N/A;   ESOPHAGOGASTRODUODENOSCOPY (EGD) WITH PROPOFOL  N/A 05/01/2021   Procedure: ESOPHAGOGASTRODUODENOSCOPY (EGD) WITH PROPOFOL ;  Surgeon: Charlanne Groom, MD;  Location: [facility];  Service: Gastroenterology;  Laterality: N/A;   INTRAOPERATIVE TRANSTHORACIC ECHOCARDIOGRAM N/A 12/26/2020    Procedure: INTRAOPERATIVE TRANSTHORACIC ECHOCARDIOGRAM;  Surgeon: Wendel Lurena POUR, MD;  Location: [facility];  Service: Open Heart Surgery;  Laterality: N/A;   MALONEY DILATION  05/01/2021   Procedure: AGAPITO DILATION;  Surgeon: Charlanne Groom, MD;  Location: [facility];  Service: Gastroenterology;;   POLYPECTOMY  05/01/2021   Procedure: POLYPECTOMY;  Surgeon: Charlanne Groom, MD;  Location: [facility];  Service: Gastroenterology;;   RIGHT/LEFT HEART CATH AND CORONARY ANGIOGRAPHY N/A 11/24/2020   Procedure: RIGHT/LEFT HEART CATH AND CORONARY ANGIOGRAPHY;  Surgeon: Claudene Victory ORN, MD;  Location: [facility] INVASIVE CV LAB;  Service: Cardiovascular;  Laterality: N/A;   TONSILLECTOMY AND ADENOIDECTOMY  1960   TRANSCATHETER AORTIC VALVE REPLACEMENT, TRANSFEMORAL N/A 12/26/2020   Procedure: TRANSCATHETER AORTIC VALVE REPLACEMENT, TRANSFEMORAL;  Surgeon: Wendel Lurena POUR, MD;  Location: [facility];  Service: Open Heart Surgery;  Laterality: N/A;   Patient Active Problem List   Diagnosis Date Noted   Acquired hammer toe of right foot 09/26/2023   Atherosclerosis of artery of both lower extremities 09/26/2023   Polyneuropathy due to type 2 diabetes mellitus (HCC) 09/26/2023   Primary localized osteoarthrosis of ankle and foot 09/26/2023   Kidney lesion 04/14/2023   Abscess of lower back 03/17/2023   Ingrown nail of great toe of left foot 12/16/2022   Vitamin D  deficiency 12/16/2022   Low back pain 12/16/2022   Chronic pain of both knees  12/16/2022   Dyspnea on exertion 12/16/2022   Coronary artery disease due to calcified coronary lesion 12/16/2022   Aortic atherosclerosis 12/16/2022   PAF (paroxysmal atrial fibrillation) (HCC) 03/14/2022   Otalgia of left ear 03/14/2022   Diplopia 09/12/2021   Allergic rhinitis 09/12/2021   Pharyngoesophageal dysphagia    Family history of colon cancer in father    Aortic valve disease 12/26/2020   COPD (chronic obstructive pulmonary disease)  (HCC) 12/26/2020   Postmenopausal estrogen deficiency 02/18/2019   Former smoker 05/13/2018   Long-term use of aspirin  therapy 05/13/2018   Peripheral vascular disease 05/05/2018   Prediabetes 05/05/2018   Type 2 diabetes mellitus in patient with obesity (HCC) 03/31/2017   CKD stage 3 secondary to diabetes (HCC) 03/31/2017   Hypercholesterolemia 06/28/2016   Aortic valve stenosis 03/29/2016   Periumbilical pain 03/29/2016   IT band syndrome 09/11/2014   Right shoulder pain 09/11/2014   Ventral hernia 05/01/2014   Morbid obesity (HCC) 03/17/2014    PCP: Sebastian Righter MD  REFERRING PROVIDER: same  REFERRING DIAG: lower back, B knee, L hip pain  THERAPY DIAG:  Difficulty in walking, not elsewhere classified  Other low back pain  Chronic pain of both knees  Abnormal posture  Rationale for Evaluation and Treatment: Rehabilitation  ONSET DATE: December 2024  SUBJECTIVE:   SUBJECTIVE STATEMENT: Doing a little pain. Still having pain in my knees. The back is not too bad today.   POOL ACCESS:  Pt is member of [employer].    PERTINENT HISTORY: Referred by PCP due to pain complaints knees, hips lower back PAIN:  Are you having pain? Yes: NPRS scale: 3/10 Pain location: lateral hips, B knees, lower back  --(and Rt shoulder 4/10) Pain description: deep pain with walking Aggravating factors: walking Relieving factors: resting  PRECAUTIONS: Fall  RED FLAGS: None   WEIGHT BEARING RESTRICTIONS: No  FALLS:  Has patient fallen in last 6 months? No  LIVING ENVIRONMENT: Lives with: lives with their son Lives in: House/apartment Stairs: steps outdoors Has following equipment at home: None  OCCUPATION: retired  PLOF: Independent  PATIENT GOALS: be able to walk without pain  NEXT MD VISIT: 1 month  OBJECTIVE:  Note: Objective measures were completed at Evaluation unless otherwise noted.  DIAGNOSTIC FINDINGS: na  PATIENT SURVEYS:  Lower Extremity Functional  Score: 35 / 80 = 43.8 %  COGNITION: Overall cognitive status: Within functional limits for tasks assessed     SENSATION: WFL    MUSCLE LENGTH: Hamstrings: Right wfl deg; Left wfl deg Thomas test: Right nt deg; Left  nt deg  POSTURE: marked valgum B knees, greater on R  PALPATION: Tender L lateral hip musculature, TFL Tender R glut medius, minimus,  LOWER EXTREMITY ROM:  Passive ROM Right eval Left eval  Hip flexion 105 95  Hip extension    Hip abduction    Hip adduction    Hip internal rotation    Hip external rotation    Knee flexion 112 102  Knee extension -3 -12  Ankle dorsiflexion    Ankle plantarflexion    Ankle inversion    Ankle eversion     (Blank rows = not tested)  LOWER EXTREMITY MMT:  MMT Right eval Left eval Right 11/24/23 Left 11/24/23  Hip flexion 4- 4- 4 4  Hip extension      Hip abduction 3+ 3- 4 4  Hip adduction      Hip internal rotation      Hip external rotation  Knee flexion      Knee extension      Ankle dorsiflexion      Ankle plantarflexion 4- 4-    Ankle inversion      Ankle eversion       (Blank rows = not tested)   FUNCTIONAL TESTS:  Timed up and go (TUG): 18.35  GAIT: Distance walked: 80' in clinic, no device, labored gait, slowed cadence decreased stride length B                                                                                                                              TREATMENT DATE:  11/21/23 10th visit PN -LEFS -TUG -MMT NuStep L5x87mins  Step ups 6  Resisted gait 30# forwards x4, side steps x3 STS 2x10 HEP   [facility] Adult PT Treatment:                                             Date: 11/19/2023 Pt seen for aquatic therapy today.  Treatment took place in water 3.5-4.75 ft in depth at the [facility] pool. Temp of water was 91.  Pt entered/exited the pool via stairs independently with bil rail.  - unsupported:  walking/backward x 4 laps with reciprocal row motion, - side  stepping with arm add/abdct with short hollow noodles x 2 laps - TrA set with short hollow noodle pull down to thighs in staggered stance x 5 each - suitcase carry at 4 ft, with bil short hollow noodles under water -> single rainbow hand float at side, walking forward/backward - UE on yellow hand floats: toe/heel raises x 15; alternating hamstring curls x 10 each LE; hip abdct/ add 3x5; hip crosses x 10 each - short rest in squatted position - UE on wall: single leg clams x 10 each; (at 63ft6) wide leg, side to side lunge hold for adductor stretch; (at 47ft 8) hip flexion /extension x10; hip circles x 10 (5 CW/5CCW)  - forward walking kick - STS from 3rd step in water, x 5 with cues for neutral head, core engaged, forward arm reach  [facility] Adult PT Treatment:                                             Date: 11/17/2023 Pt seen for aquatic therapy today.  Treatment took place in water 3.5-4.75 ft in depth at the [facility] pool. Temp of water was 91.  Pt entered/exited the pool via stairs independently with bil rail.  - unsupported:  walking/backward x 3 laps with reciprocal row motion, side stepping with arm add/abdct with short hollow noodles x 3 laps - squatted rest at wall - UE on wall: alternating hamstring curls x 10  each LE; single leg clams x 10 each (improved); toe/heel raises x 20; hip flexion /extension 2x10; hip abdct/ add 2x10; squatted rest; hip crosses x 10 each - UE on yellow hand floats: forward walking kicks  - suitcase carry at 4 ft, with bil short hollow noodles under water -> single rainbow hand float at side, walking forward/backward - STS from 3rd step in water, x 5 with cues for neutral head, core engaged, forward arm reach  [facility] Adult PT Treatment:                                             Date: 11/14/2023 Pt seen for aquatic therapy today.  Treatment took place in water 3.5-4.75 ft in depth at the [facility] pool. Temp of water was 91.  Pt  entered/exited the pool via stairs independently with bil rail.  - unsupported:  walking/backward x 3 laps with reciprocal row motion, side stepping 2 laps with horiz abdct/add; side stepping with arm add/abdct with short hollow noodles x 1 lap - UE on wall: alternating hamstring curls x 10 each LE; toe/heel raises x 20;  single leg clams x 10 each (limited ROM); hip crosses x 10 each; hip abdct/ add 2x10 - return to walking with relaxed arms - UE on yellow hand floats: forward walking kicks  - marching with reciprocal row motion with short hollow noodles -> forward walking kicks - TrA set with short hollow noodle pull down to thighs x 3 -> long hollow noodle x 8 (pain in R shoulder) - STS at bench in water with feet on blue step, x 10 with cues for neutral head and core engaged    [facility] Adult PT Treatment:                                             Date: 11/07/2023 Pt seen for aquatic therapy today.  Treatment took place in water 3.5-4.75 ft in depth at the [facility] pool. Temp of water was 91.  Pt entered/exited the pool via stairs independently with bil rail.  - unsupported:  walking/backward x 3 laps, side stepping 2 laps -side stepping with arm add/abdct with short hollow noodles x 1 lap - marching with reciprocal row motion with short hollow noodles x 2 laps - squatted rest at wall - UE on wall:  toe/heel raises x 15; hip add/abdct x10 ; hip flexion /extension x10;  - return to walking forward/ backward 2 laps  -UE on wall - 10 squats  - TrA set with short hollow noodle pull down to thighs x 10; in staggered stance x 5 each (good balance challenge) - at rails:  L stretch with knees bent x 20sec, 2nd rep with gentle tail wag;  L/R hamstring stretch with foot on 2nd step standing up straight x 20sec each  [facility] Adult PT Treatment:                                             Date: 11/05/2023 Pt seen for aquatic therapy today.  Treatment took place in water 3.5-4.75 ft in depth  at the [facility] pool.  Temp of water was 91.  Pt entered/exited the pool via stairs independently with bil rail.  - Intro to aquatic therapy principles - UE on barbell:  walking forward x 3 laps, side stepping 2 laps, backward 2 laps - squatted rest at wall - UE on yellow noodle: walking forward/ backward 2 laps, side stepping 1 lap - UE on yellow hand floats: high knee marching backward/ forward and sideways - UE on wall:  toe/heel raises x 10; hip add/abdct x10 ; hip flexion /extension x10;  -squatted rest at wall - TrA set with short hollow noodle pull down to thighs x 10  - yellow noodle under arms with UE on wall-> white noodle under arms with UE on corner: cycling (difficulty maintaining neutral trunk)  10/29/23:  Nustep L 5 x 7 min x 4 exts  Yellow t band B shoulder ER  Yellow t  band rows band under feet B In ll bars with blue t band around thighs, for alt hip abd 15 x In ll bars, blue t band around thighs for alt SLR, opp hip ext ,rocking forward and back Knee flexion B 35# 3 x 10  Knee ext 10# 3 x 10 Supine for 65 cm ball , B knee to chest, LTR, alt SLR, bridging all 15x Side lying R for deep cross friction massage L lateral hip musculature with theragun   10/27/23:  Knee ext B,10 # 3 x 10 Knee flex B 35# 3 x 10 In ll bars for heel/toe rocks on airex 15 x In ll bars with blue t band around thighs, for alt hip abd 15 x In ll bars, blue t band around thighs for alt SLR, opp hip ext ,rocking forward and back Nustep L 5 x 6 min x 4 exts Side lying for deep cross friction massage L lateral hip over TFL, glut medius with theragun Supine with 65 cm physioball under feet for B knee to chest, LTR, bridging all 10 x  Seated for brief assessment of R shoulder, B shoulder flexion limited, R to 90, L to 110 B shoulder IR/ER wnl MMT B shoulders with pain and weakness R ER, B shoulder flex and abd   10/08/23:  Eval:  side lying on each side for theragun each hip lateral  musculature with improved Sx.  PATIENT EDUCATION:  Education details: exercise rationale, progression and modification  Person educated: Patient  Education method: Explanation, demonstration Education comprehension: verbalized understanding  HOME EXERCISE PROGRAM: Access Code: [email or web address] URL: [email or web address] Date: 11/24/2023 Prepared by: Almetta Fam  Exercises - Step Up  - 1 x daily - 7 x weekly - 2 sets - 10 reps - Lateral Step Up  - 1 x daily - 7 x weekly - 2 sets - 10 reps - Sit to Stand  - 1 x daily - 7 x weekly - 2 sets - 10 reps - Seated Long Arc Quad  - 1 x daily - 7 x weekly - 2 sets - 10 reps  ASSESSMENT:  CLINICAL IMPRESSION:  Patient returns to land therapy for 10th visit progress note. She has some ongoing pain in her knees, but states the back feels better. She demonstrates some difficulty with step ups leading with the R side, does better when going up with the L. With resisted gait, she reports feeling it in her hips when doing the side steps. Requires 1 seated rest break with this. Patient feels that aquatic therapy has been helping so far, would like to continue with  this.    Eval: Patient is a 68 y.o. female who was evaluated today for physical therapy due to chronic pain multiple jts. Presents with postural changes remarkably B knee valgum, also weakness B hips, decreased walking tolerance.  Would benefit from PT with combination of manual techniques, therex, modalities to improve sx and work with her [facility] We discussed aquatic therapy due to multiple jt involvement and she is very interested in pursuing this aspect of PT  OBJECTIVE IMPAIRMENTS: cardiopulmonary status limiting activity, decreased activity tolerance, decreased balance, decreased endurance, decreased knowledge of condition, decreased mobility, difficulty walking, decreased ROM, decreased strength, increased fascial restrictions, impaired  perceived functional ability, postural dysfunction, and pain.   ACTIVITY LIMITATIONS: carrying, standing, squatting, stairs, transfers, and locomotion level  PARTICIPATION LIMITATIONS: cleaning, laundry, shopping, community activity, and yard work  PERSONAL FACTORS: Behavior pattern, Time since onset of injury/illness/exacerbation, and 1-2 comorbidities: COPD are also affecting patient's functional outcome.   REHAB POTENTIAL: Good  CLINICAL DECISION MAKING: Evolving/moderate complexity  EVALUATION COMPLEXITY: Moderate   GOALS: Goals reviewed with patient? Yes  SHORT TERM GOALS: Target date: 2 weeks, 10/22/23 I HEP Baseline: Goal status: just uses small pedal machine at home 11/24/23   LONG TERM GOALS: Target date: 01/01/24, 12 weeks  LEFSLower Extremity Functional Score: 35 / 80 = 43.8 % improve to 30%or less disability Baseline:  Goal status: 25/80 going 11/24/23  2.  TUG improve from 18 to 14 or less Baseline:  Goal status: 15s 11/24/23  3.  Strength B hip abd, ext 5/5 Baseline: 3+ and 4-/5 Goal status: ongoing 11/24/23     PLAN:  PT FREQUENCY: 2x/week  PT DURATION: 12 weeks  PLANNED INTERVENTIONS: 97110-Therapeutic exercises, 97530- Therapeutic activity, 97112- Neuromuscular re-education, 97535- Self Care, [id number]- Manual therapy, [phone]- Aquatic Therapy, and Patient/Family education  PLAN FOR NEXT SESSION: assess response to aquatic therapy session  Almetta Fam, PT, DPT 11/24/23 9:29 AM [facility] [address] [place], [place], [address] Phone: [phone]   Fax:  [phone]

## 2023-11-21 NOTE — Telephone Encounter (Signed)
 Eliquis  5mg  refill request received. Patient is 68 years old, weight-159.8kg, Crea-1.45 on 09/18/23, Diagnosis-Afib, and last seen by Reche Finder on 08/08/23. Dose is appropriate based on dosing criteria. Will send in refill to requested pharmacy.

## 2023-11-24 ENCOUNTER — Other Ambulatory Visit (HOSPITAL_COMMUNITY): Payer: Self-pay

## 2023-11-24 ENCOUNTER — Ambulatory Visit: Attending: Family Medicine

## 2023-11-24 ENCOUNTER — Other Ambulatory Visit: Payer: Self-pay

## 2023-11-24 DIAGNOSIS — M5459 Other low back pain: Secondary | ICD-10-CM | POA: Diagnosis not present

## 2023-11-24 DIAGNOSIS — M25561 Pain in right knee: Secondary | ICD-10-CM | POA: Insufficient documentation

## 2023-11-24 DIAGNOSIS — G8929 Other chronic pain: Secondary | ICD-10-CM | POA: Diagnosis not present

## 2023-11-24 DIAGNOSIS — M25562 Pain in left knee: Secondary | ICD-10-CM | POA: Diagnosis not present

## 2023-11-24 DIAGNOSIS — R293 Abnormal posture: Secondary | ICD-10-CM | POA: Insufficient documentation

## 2023-11-24 DIAGNOSIS — R262 Difficulty in walking, not elsewhere classified: Secondary | ICD-10-CM | POA: Diagnosis not present

## 2023-11-25 ENCOUNTER — Telehealth: Payer: Self-pay

## 2023-11-25 ENCOUNTER — Ambulatory Visit (HOSPITAL_BASED_OUTPATIENT_CLINIC_OR_DEPARTMENT_OTHER): Payer: Self-pay | Admitting: Physical Therapy

## 2023-11-25 ENCOUNTER — Encounter (HOSPITAL_BASED_OUTPATIENT_CLINIC_OR_DEPARTMENT_OTHER): Payer: Self-pay | Admitting: Physical Therapy

## 2023-11-25 DIAGNOSIS — R262 Difficulty in walking, not elsewhere classified: Secondary | ICD-10-CM

## 2023-11-25 DIAGNOSIS — G8929 Other chronic pain: Secondary | ICD-10-CM

## 2023-11-25 DIAGNOSIS — M5459 Other low back pain: Secondary | ICD-10-CM

## 2023-11-25 DIAGNOSIS — R293 Abnormal posture: Secondary | ICD-10-CM

## 2023-11-25 NOTE — Therapy (Signed)
 OUTPATIENT PHYSICAL THERAPY LOWER EXTREMITY TREATMENT    Patient Name: Rose Miller MRN: [id number] DOB:December 19, 1955, 68 y.o., female Today's Date: 11/25/2023  END OF SESSION:  PT End of Session - 11/25/23 1537     Visit Number 11    Date for Recertification  01/01/24    PT Start Time 1530    PT Stop Time 1611    PT Time Calculation (min) 41 min    Activity Tolerance Patient tolerated treatment well    Behavior During Therapy [facility] for tasks assessed/performed            Past Medical History:  Diagnosis Date   Adhesive capsulitis    Aortic valve disease    Arthritis    Asthma    Back pain    CKD (chronic kidney disease) stage 3, GFR 30-59 ml/min (HCC)    Constipation    COPD (chronic obstructive pulmonary disease) (HCC)    Diabetes mellitus without complication (HCC) 10 20 2020   Diuretic-induced hypokalemia    Heart murmur    Hypercholesterolemia    Hypertension    IT band syndrome    Morbid obesity (HCC)    Palpitations    Periumbilical pain    Postmenopausal estrogen deficiency    Prediabetes    S/P TAVR (transcatheter aortic valve replacement) 12/26/2020   Ventral hernia    Past Surgical History:  Procedure Laterality Date   BIOPSY  05/01/2021   Procedure: BIOPSY;  Surgeon: Charlanne Groom, MD;  Location: [facility];  Service: Gastroenterology;;   CARDIAC CATHETERIZATION  11/24/2020   CESAREAN SECTION  1989   COLONOSCOPY     COLONOSCOPY WITH PROPOFOL  N/A 05/01/2021   Procedure: COLONOSCOPY WITH PROPOFOL ;  Surgeon: Charlanne Groom, MD;  Location: [facility];  Service: Gastroenterology;  Laterality: N/A;   ESOPHAGOGASTRODUODENOSCOPY (EGD) WITH PROPOFOL  N/A 05/01/2021   Procedure: ESOPHAGOGASTRODUODENOSCOPY (EGD) WITH PROPOFOL ;  Surgeon: Charlanne Groom, MD;  Location: [facility];  Service: Gastroenterology;  Laterality: N/A;   INTRAOPERATIVE TRANSTHORACIC ECHOCARDIOGRAM N/A 12/26/2020   Procedure: INTRAOPERATIVE TRANSTHORACIC ECHOCARDIOGRAM;  Surgeon:  Wendel Lurena POUR, MD;  Location: [facility];  Service: Open Heart Surgery;  Laterality: N/A;   MALONEY DILATION  05/01/2021   Procedure: AGAPITO DILATION;  Surgeon: Charlanne Groom, MD;  Location: [facility];  Service: Gastroenterology;;   POLYPECTOMY  05/01/2021   Procedure: POLYPECTOMY;  Surgeon: Charlanne Groom, MD;  Location: [facility];  Service: Gastroenterology;;   RIGHT/LEFT HEART CATH AND CORONARY ANGIOGRAPHY N/A 11/24/2020   Procedure: RIGHT/LEFT HEART CATH AND CORONARY ANGIOGRAPHY;  Surgeon: Claudene Victory ORN, MD;  Location: [facility] INV[facility]IVE CV LAB;  Service: Cardiovascular;  Laterality: N/A;   TONSILLECTOMY AND ADENOIDECTOMY  1960   TRANSCATHETER AORTIC VALVE REPLACEMENT, TRANSFEMORAL N/A 12/26/2020   Procedure: TRANSCATHETER AORTIC VALVE REPLACEMENT, TRANSFEMORAL;  Surgeon: Wendel Lurena POUR, MD;  Location: [facility];  Service: Open Heart Surgery;  Laterality: N/A;   Patient Active Problem List   Diagnosis Date Noted   Acquired hammer toe of right foot 09/26/2023   Atherosclerosis of artery of both lower extremities 09/26/2023   Polyneuropathy due to type 2 diabetes mellitus (HCC) 09/26/2023   Primary localized osteoarthrosis of ankle and foot 09/26/2023   Kidney lesion 04/14/2023   Abscess of lower back 03/17/2023   Ingrown nail of great toe of left foot 12/16/2022   Vitamin D  deficiency 12/16/2022   Low back pain 12/16/2022   Chronic pain of both knees 12/16/2022   Dyspnea on exertion 12/16/2022   Coronary artery  disease due to calcified coronary lesion 12/16/2022   Aortic atherosclerosis 12/16/2022   PAF (paroxysmal atrial fibrillation) (HCC) 03/14/2022   Otalgia of left ear 03/14/2022   Diplopia 09/12/2021   Allergic rhinitis 09/12/2021   Pharyngoesophageal dysphagia    Family history of colon cancer in father    Aortic valve disease 12/26/2020   COPD (chronic obstructive pulmonary disease) (HCC) 12/26/2020   Postmenopausal estrogen deficiency 02/18/2019    Former smoker 05/13/2018   Long-term use of aspirin  therapy 05/13/2018   Peripheral vascular disease 05/05/2018   Prediabetes 05/05/2018   Type 2 diabetes mellitus in patient with obesity (HCC) 03/31/2017   CKD stage 3 secondary to diabetes (HCC) 03/31/2017   Hypercholesterolemia 06/28/2016   Aortic valve stenosis 03/29/2016   Periumbilical pain 03/29/2016   IT band syndrome 09/11/2014   Right shoulder pain 09/11/2014   Ventral hernia 05/01/2014   Morbid obesity (HCC) 03/17/2014    PCP: Sebastian Righter MD  REFERRING PROVIDER: same  REFERRING DIAG: lower back, B knee, L hip pain  THERAPY DIAG:  Difficulty in walking, not elsewhere classified  Other low back pain  Chronic pain of both knees  Abnormal posture  Rationale for Evaluation and Treatment: Rehabilitation  ONSET DATE: December 2024  SUBJECTIVE:   SUBJECTIVE STATEMENT: Pt reports she had xrays of knees, shoulder, and back, but hasn't received results yet.  Sore from yesterday's session. Has not been to pool yet ;needs to talk to friend about going or sister.   POOL ACCESS:  Pt is member of [employer].    PERTINENT HISTORY: Referred by PCP due to pain complaints knees, hips lower back PAIN:  Are you having pain? Yes: NPRS scale: 3-4/10 Pain location: lateral hips, B knees, lower back  --(and Rt shoulder 3/10) Pain description: deep pain with walking Aggravating factors: walking Relieving factors: resting  PRECAUTIONS: Fall  RED FLAGS: None   WEIGHT BEARING RESTRICTIONS: No  FALLS:  Has patient fallen in last 6 months? No  LIVING ENVIRONMENT: Lives with: lives with their son Lives in: House/apartment Stairs: steps outdoors Has following equipment at home: None  OCCUPATION: retired  PLOF: Independent  PATIENT GOALS: be able to walk without pain  NEXT MD VISIT: 1 month  OBJECTIVE:  Note: Objective measures were completed at Evaluation unless otherwise noted.  DIAGNOSTIC FINDINGS: na  PATIENT  SURVEYS:  Lower Extremity Functional Score: 35 / 80 = 43.8 % 11/24/23: 25/80 = COGNITION: Overall cognitive status: Within functional limits for tasks assessed     SENSATION: WFL    MUSCLE LENGTH: Hamstrings: Right wfl deg; Left wfl deg Debby test: Right nt deg; Left  nt deg  POSTURE: marked valgum B knees, greater on R  PALPATION: Tender L lateral hip musculature, TFL Tender R glut medius, minimus,  LOWER EXTREMITY ROM:  Passive ROM Right eval Left eval  Hip flexion 105 95  Hip extension    Hip abduction    Hip adduction    Hip internal rotation    Hip external rotation    Knee flexion 112 102  Knee extension -3 -12  Ankle dorsiflexion    Ankle plantarflexion    Ankle inversion    Ankle eversion     (Blank rows = not tested)  LOWER EXTREMITY MMT:  MMT Right eval Left eval Right 11/24/23 Left 11/24/23  Hip flexion 4- 4- 4 4  Hip extension      Hip abduction 3+ 3- 4 4  Hip adduction      Hip internal  rotation      Hip external rotation      Knee flexion      Knee extension      Ankle dorsiflexion      Ankle plantarflexion 4- 4-    Ankle inversion      Ankle eversion       (Blank rows = not tested)   FUNCTIONAL TESTS:  Timed up and go (TUG): 18.35 11/24/23:  TUG 15s   GAIT: Distance walked: 20' in clinic, no device, labored gait, slowed cadence decreased stride length B                                                                                                                              TREATMENT DATE:  [facility] Adult PT Treatment:                                             Date: 11/25/2023 Pt seen for aquatic therapy today.  Treatment took place in water 3.5-4.75 ft in depth at the [facility] pool. Temp of water was 91.  Pt entered/exited the pool via stairs independently with bil rail.  - unsupported:  walking/backward x 3 laps with reciprocal row motion, - side stepping with arm horiz add/abdct with rainbow hand floats 2 laps  -> add/abdct x 2 laps - suitcase carry with bil rainbow hand floats at side under water, walking forward/ backward - short squatted rest at wall - UE on rainbow hand floats: toe/heel raises x 12; hip abdct/ add x 10 each; hip flexion/ extension x 10 each (balance challenge); hip crosses x 10 each - UE on rainbow hand floats: forward walking kicks  - TrA set with long hollow noodle pull down to thighs in staggered stance x 10 each - return to walking with reciprocal arm swing, forward/backward - sitting on bench in water: LAQ with DF, alternating LEs; hip abdct/ add with core engaged; bicycle legs - plank with hands on bench in water: hip ext x 2 reps each LE  11/21/23 10th visit PN -LEFS -TUG -MMT NuStep L5x83mins  Step ups 6  Resisted gait 30# forwards x4, side steps x3 STS 2x10 HEP   [facility] Adult PT Treatment:                                             Date: 11/19/2023 Pt seen for aquatic therapy today.  Treatment took place in water 3.5-4.75 ft in depth at the [facility] pool. Temp of water was 91.  Pt entered/exited the pool via stairs independently with bil rail.  - unsupported:  walking/backward x 4 laps with reciprocal row motion, - side stepping with arm add/abdct with short hollow noodles x 2  laps - TrA set with short hollow noodle pull down to thighs in staggered stance x 5 each - suitcase carry at 4 ft, with bil short hollow noodles under water -> single rainbow hand float at side, walking forward/backward - UE on yellow hand floats: toe/heel raises x 15; alternating hamstring curls x 10 each LE; hip abdct/ add 3x5; hip crosses x 10 each - short rest in squatted position - UE on wall: single leg clams x 10 each; (at 42ft6) wide leg, side to side lunge hold for adductor stretch; (at 71ft 8) hip flexion /extension x10; hip circles x 10 (5 CW/5CCW)  - forward walking kick - STS from 3rd step in water, x 5 with cues for neutral head, core engaged, forward arm  reach  [facility] Adult PT Treatment:                                             Date: 11/17/2023 Pt seen for aquatic therapy today.  Treatment took place in water 3.5-4.75 ft in depth at the [facility] pool. Temp of water was 91.  Pt entered/exited the pool via stairs independently with bil rail.  - unsupported:  walking/backward x 3 laps with reciprocal row motion, side stepping with arm add/abdct with short hollow noodles x 3 laps - squatted rest at wall - UE on wall: alternating hamstring curls x 10 each LE; single leg clams x 10 each (improved); toe/heel raises x 20; hip flexion /extension 2x10; hip abdct/ add 2x10; squatted rest; hip crosses x 10 each - UE on yellow hand floats: forward walking kicks  - suitcase carry at 4 ft, with bil short hollow noodles under water -> single rainbow hand float at side, walking forward/backward - STS from 3rd step in water, x 5 with cues for neutral head, core engaged, forward arm reach  [facility] Adult PT Treatment:                                             Date: 11/14/2023 Pt seen for aquatic therapy today.  Treatment took place in water 3.5-4.75 ft in depth at the [facility] pool. Temp of water was 91.  Pt entered/exited the pool via stairs independently with bil rail.  - unsupported:  walking/backward x 3 laps with reciprocal row motion, side stepping 2 laps with horiz abdct/add; side stepping with arm add/abdct with short hollow noodles x 1 lap - UE on wall: alternating hamstring curls x 10 each LE; toe/heel raises x 20;  single leg clams x 10 each (limited ROM); hip crosses x 10 each; hip abdct/ add 2x10 - return to walking with relaxed arms - UE on yellow hand floats: forward walking kicks  - marching with reciprocal row motion with short hollow noodles -> forward walking kicks - TrA set with short hollow noodle pull down to thighs x 3 -> long hollow noodle x 8 (pain in R shoulder) - STS at bench in water with feet on blue step, x 10  with cues for neutral head and core engaged    [facility] Adult PT Treatment:  Date: 11/07/2023 Pt seen for aquatic therapy today.  Treatment took place in water 3.5-4.75 ft in depth at the [facility] pool. Temp of water was 91.  Pt entered/exited the pool via stairs independently with bil rail.  - unsupported:  walking/backward x 3 laps, side stepping 2 laps -side stepping with arm add/abdct with short hollow noodles x 1 lap - marching with reciprocal row motion with short hollow noodles x 2 laps - squatted rest at wall - UE on wall:  toe/heel raises x 15; hip add/abdct x10 ; hip flexion /extension x10;  - return to walking forward/ backward 2 laps  -UE on wall - 10 squats  - TrA set with short hollow noodle pull down to thighs x 10; in staggered stance x 5 each (good balance challenge) - at rails:  L stretch with knees bent x 20sec, 2nd rep with gentle tail wag;  L/R hamstring stretch with foot on 2nd step standing up straight x 20sec each  [facility] Adult PT Treatment:                                             Date: 11/05/2023 Pt seen for aquatic therapy today.  Treatment took place in water 3.5-4.75 ft in depth at the [facility] pool. Temp of water was 91.  Pt entered/exited the pool via stairs independently with bil rail.  - Intro to aquatic therapy principles - UE on barbell:  walking forward x 3 laps, side stepping 2 laps, backward 2 laps - squatted rest at wall - UE on yellow noodle: walking forward/ backward 2 laps, side stepping 1 lap - UE on yellow hand floats: high knee marching backward/ forward and sideways - UE on wall:  toe/heel raises x 10; hip add/abdct x10 ; hip flexion /extension x10;  -squatted rest at wall - TrA set with short hollow noodle pull down to thighs x 10  - yellow noodle under arms with UE on wall-> white noodle under arms with UE on corner: cycling (difficulty maintaining neutral trunk)  10/29/23:   Nustep L 5 x 7 min x 4 exts  Yellow t band B shoulder ER  Yellow t  band rows band under feet B In ll bars with blue t band around thighs, for alt hip abd 15 x In ll bars, blue t band around thighs for alt SLR, opp hip ext ,rocking forward and back Knee flexion B 35# 3 x 10  Knee ext 10# 3 x 10 Supine for 65 cm ball , B knee to chest, LTR, alt SLR, bridging all 15x Side lying R for deep cross friction massage L lateral hip musculature with theragun   10/27/23:  Knee ext B,10 # 3 x 10 Knee flex B 35# 3 x 10 In ll bars for heel/toe rocks on airex 15 x In ll bars with blue t band around thighs, for alt hip abd 15 x In ll bars, blue t band around thighs for alt SLR, opp hip ext ,rocking forward and back Nustep L 5 x 6 min x 4 exts Side lying for deep cross friction massage L lateral hip over TFL, glut medius with theragun Supine with 65 cm physioball under feet for B knee to chest, LTR, bridging all 10 x  Seated for brief assessment of R shoulder, B shoulder flexion limited, R to 90, L to 110 B  shoulder IR/ER wnl MMT B shoulders with pain and weakness R ER, B shoulder flex and abd   10/08/23:  Eval:  side lying on each side for theragun each hip lateral musculature with improved Sx.  PATIENT EDUCATION:  Education details: exercise rationale, progression and modification  Person educated: Patient  Education method: Explanation, demonstration Education comprehension: verbalized understanding  HOME EXERCISE PROGRAM: Access Code: [email or web address] URL: [email or web address] Date: 11/24/2023 Prepared by: Almetta Fam  Exercises - Step Up  - 1 x daily - 7 x weekly - 2 sets - 10 reps - Lateral Step Up  - 1 x daily - 7 x weekly - 2 sets - 10 reps - Sit to Stand  - 1 x daily - 7 x weekly - 2 sets - 10 reps - Seated Long Arc Quad  - 1 x daily - 7 x weekly - 2 sets - 10 reps  ASSESSMENT:  CLINICAL IMPRESSION:  Pt able to tolerate increased resistance with UE today, without  increase in Rt shoulder pain.  Improved balance with single leg stance exercises. Pain eliminated in water after 30 minutes of exercise. Will continue to progress as tolerated,  towards established goals.   Eval: Patient is a 68 y.o. female who was evaluated today for physical therapy due to chronic pain multiple jts. Presents with postural changes remarkably B knee valgum, also weakness B hips, decreased walking tolerance.  Would benefit from PT with combination of manual techniques, therex, modalities to improve sx and work with her [facility] We discussed aquatic therapy due to multiple jt involvement and she is very interested in pursuing this aspect of PT  OBJECTIVE IMPAIRMENTS: cardiopulmonary status limiting activity, decreased activity tolerance, decreased balance, decreased endurance, decreased knowledge of condition, decreased mobility, difficulty walking, decreased ROM, decreased strength, increased fascial restrictions, impaired perceived functional ability, postural dysfunction, and pain.   ACTIVITY LIMITATIONS: carrying, standing, squatting, stairs, transfers, and locomotion level  PARTICIPATION LIMITATIONS: cleaning, laundry, shopping, community activity, and yard work  PERSONAL FACTORS: Behavior pattern, Time since onset of injury/illness/exacerbation, and 1-2 comorbidities: COPD are also affecting patient's functional outcome.   REHAB POTENTIAL: Good  CLINICAL DECISION MAKING: Evolving/moderate complexity  EVALUATION COMPLEXITY: Moderate   GOALS: Goals reviewed with patient? Yes  SHORT TERM GOALS: Target date: 2 weeks, 10/22/23 I HEP Baseline: Goal status: just uses small pedal machine at home 11/24/23   LONG TERM GOALS: Target date: 01/01/24, 12 weeks  LEFSLower Extremity Functional Score: 35 / 80 = 43.8 % improve to 30%or less disability Baseline:  Goal status: 25/80 going 11/24/23  2.  TUG improve from 18 to 14 or  less Baseline:  Goal status: 15s 11/24/23  3.  Strength B hip abd, ext 5/5 Baseline: 3+ and 4-/5 Goal status: ongoing 11/24/23     PLAN:  PT FREQUENCY: 2x/week  PT DURATION: 12 weeks  PLANNED INTERVENTIONS: 97110-Therapeutic exercises, 97530- Therapeutic activity, 97112- Neuromuscular re-education, 97535- Self Care, [id number]- Manual therapy, [phone]- Aquatic Therapy, and Patient/Family education  PLAN FOR NEXT SESSION: assess response to aquatic therapy session Delon Aquas, PTA 11/25/23 5:21 PM [facility] [address] [place], [place], [address] Phone: [phone]   Fax:  [phone]

## 2023-11-25 NOTE — Progress Notes (Signed)
   11/25/2023  Patient ID: Rose Miller, female   DOB: [birth year], 68 y.o.   MRN: [id number]  Contacted [employer] [facility] to follow-up on Ozempic  2mg  reorder processing.  This went into process yesterday, and they state the patient will receive 1 more box; this should arrive at Dr. Oswald office by 12/2.  Sending Ms. Wandel a MyChart message to make her aware.  Channing DELENA Mealing, PharmD, DPLA

## 2023-12-02 NOTE — Telephone Encounter (Signed)
 PAP: Application for Bernadine has been submitted to [employer]), via fax

## 2023-12-03 ENCOUNTER — Ambulatory Visit (HOSPITAL_BASED_OUTPATIENT_CLINIC_OR_DEPARTMENT_OTHER): Admitting: Physical Therapy

## 2023-12-03 ENCOUNTER — Ambulatory Visit (HOSPITAL_BASED_OUTPATIENT_CLINIC_OR_DEPARTMENT_OTHER): Payer: Self-pay | Admitting: Physical Therapy

## 2023-12-03 ENCOUNTER — Encounter (HOSPITAL_BASED_OUTPATIENT_CLINIC_OR_DEPARTMENT_OTHER): Payer: Self-pay | Admitting: Physical Therapy

## 2023-12-03 DIAGNOSIS — R262 Difficulty in walking, not elsewhere classified: Secondary | ICD-10-CM

## 2023-12-03 DIAGNOSIS — M5459 Other low back pain: Secondary | ICD-10-CM

## 2023-12-03 DIAGNOSIS — G8929 Other chronic pain: Secondary | ICD-10-CM

## 2023-12-03 NOTE — Therapy (Signed)
 OUTPATIENT PHYSICAL THERAPY LOWER EXTREMITY TREATMENT    Patient Name: Rose Miller MRN: [id number] DOB:1956/01/04, 68 y.o., female Today's Date: 12/03/2023  END OF SESSION:  PT End of Session - 12/03/23 1551     Visit Number 12    Date for Recertification  01/01/24    PT Start Time 1532    PT Stop Time 1610    PT Time Calculation (min) 38 min    Activity Tolerance Patient tolerated treatment well    Behavior During Therapy [facility] for tasks assessed/performed             Past Medical History:  Diagnosis Date   Adhesive capsulitis    Aortic valve disease    Arthritis    Asthma    Back pain    CKD (chronic kidney disease) stage 3, GFR 30-59 ml/min (HCC)    Constipation    COPD (chronic obstructive pulmonary disease) (HCC)    Diabetes mellitus without complication (HCC) 10 20 2020   Diuretic-induced hypokalemia    Heart murmur    Hypercholesterolemia    Hypertension    IT band syndrome    Morbid obesity (HCC)    Palpitations    Periumbilical pain    Postmenopausal estrogen deficiency    Prediabetes    S/P TAVR (transcatheter aortic valve replacement) 12/26/2020   Ventral hernia    Past Surgical History:  Procedure Laterality Date   BIOPSY  05/01/2021   Procedure: BIOPSY;  Surgeon: Charlanne Groom, MD;  Location: [facility];  Service: Gastroenterology;;   CARDIAC CATHETERIZATION  11/24/2020   CESAREAN SECTION  1989   COLONOSCOPY     COLONOSCOPY WITH PROPOFOL  N/A 05/01/2021   Procedure: COLONOSCOPY WITH PROPOFOL ;  Surgeon: Charlanne Groom, MD;  Location: [facility];  Service: Gastroenterology;  Laterality: N/A;   ESOPHAGOGASTRODUODENOSCOPY (EGD) WITH PROPOFOL  N/A 05/01/2021   Procedure: ESOPHAGOGASTRODUODENOSCOPY (EGD) WITH PROPOFOL ;  Surgeon: Charlanne Groom, MD;  Location: [facility];  Service: Gastroenterology;  Laterality: N/A;   INTRAOPERATIVE TRANSTHORACIC ECHOCARDIOGRAM N/A 12/26/2020   Procedure: INTRAOPERATIVE TRANSTHORACIC ECHOCARDIOGRAM;  Surgeon:  Wendel Lurena POUR, MD;  Location: [facility];  Service: Open Heart Surgery;  Laterality: N/A;   MALONEY DILATION  05/01/2021   Procedure: AGAPITO DILATION;  Surgeon: Charlanne Groom, MD;  Location: [facility];  Service: Gastroenterology;;   POLYPECTOMY  05/01/2021   Procedure: POLYPECTOMY;  Surgeon: Charlanne Groom, MD;  Location: [facility];  Service: Gastroenterology;;   RIGHT/LEFT HEART CATH AND CORONARY ANGIOGRAPHY N/A 11/24/2020   Procedure: RIGHT/LEFT HEART CATH AND CORONARY ANGIOGRAPHY;  Surgeon: Claudene Victory ORN, MD;  Location: [facility] CV LAB;  Service: Cardiovascular;  Laterality: N/A;   TONSILLECTOMY AND ADENOIDECTOMY  1960   TRANSCATHETER AORTIC VALVE REPLACEMENT, TRANSFEMORAL N/A 12/26/2020   Procedure: TRANSCATHETER AORTIC VALVE REPLACEMENT, TRANSFEMORAL;  Surgeon: Wendel Lurena POUR, MD;  Location: [facility];  Service: Open Heart Surgery;  Laterality: N/A;   Patient Active Problem List   Diagnosis Date Noted   Acquired hammer toe of right foot 09/26/2023   Atherosclerosis of artery of both lower extremities 09/26/2023   Polyneuropathy due to type 2 diabetes mellitus (HCC) 09/26/2023   Primary localized osteoarthrosis of ankle and foot 09/26/2023   Kidney lesion 04/14/2023   Abscess of lower back 03/17/2023   Ingrown nail of great toe of left foot 12/16/2022   Vitamin D  deficiency 12/16/2022   Low back pain 12/16/2022   Chronic pain of both knees 12/16/2022   Dyspnea on exertion 12/16/2022   Coronary  artery disease due to calcified coronary lesion 12/16/2022   Aortic atherosclerosis 12/16/2022   PAF (paroxysmal atrial fibrillation) (HCC) 03/14/2022   Otalgia of left ear 03/14/2022   Diplopia 09/12/2021   Allergic rhinitis 09/12/2021   Pharyngoesophageal dysphagia    Family history of colon cancer in father    Aortic valve disease 12/26/2020   COPD (chronic obstructive pulmonary disease) (HCC) 12/26/2020   Postmenopausal estrogen deficiency 02/18/2019    Former smoker 05/13/2018   Long-term use of aspirin  therapy 05/13/2018   Peripheral vascular disease 05/05/2018   Prediabetes 05/05/2018   Type 2 diabetes mellitus in patient with obesity (HCC) 03/31/2017   CKD stage 3 secondary to diabetes (HCC) 03/31/2017   Hypercholesterolemia 06/28/2016   Aortic valve stenosis 03/29/2016   Periumbilical pain 03/29/2016   IT band syndrome 09/11/2014   Right shoulder pain 09/11/2014   Ventral hernia 05/01/2014   Morbid obesity (HCC) 03/17/2014    PCP: Sebastian Righter MD  REFERRING PROVIDER: same  REFERRING DIAG: lower back, B knee, L hip pain  THERAPY DIAG:  Difficulty in walking, not elsewhere classified  Other low back pain  Chronic pain of both knees  Rationale for Evaluation and Treatment: Rehabilitation  ONSET DATE: December 2024  SUBJECTIVE:   SUBJECTIVE STATEMENT: I have some kind of OA in my shoulder, didn't get report back on hip or knee.  Pain today 3/10.  Have not gone to [facility]  POOL ACCESS:  Pt is member of [employer].    PERTINENT HISTORY: Referred by PCP due to pain complaints knees, hips lower back PAIN:  Are you having pain? Yes: NPRS scale: 3-4/10 Pain location: lateral hips, B knees, lower back  --(and Rt shoulder 3/10) Pain description: deep pain with walking Aggravating factors: walking Relieving factors: resting  PRECAUTIONS: Fall  RED FLAGS: None   WEIGHT BEARING RESTRICTIONS: No  FALLS:  Has patient fallen in last 6 months? No  LIVING ENVIRONMENT: Lives with: lives with their son Lives in: House/apartment Stairs: steps outdoors Has following equipment at home: None  OCCUPATION: retired  PLOF: Independent  PATIENT GOALS: be able to walk without pain  NEXT MD VISIT: 1 month  OBJECTIVE:  Note: Objective measures were completed at Evaluation unless otherwise noted.  DIAGNOSTIC FINDINGS: na  PATIENT SURVEYS:  Lower Extremity Functional Score: 35 / 80 = 43.8 % 11/24/23: 25/80  = COGNITION: Overall cognitive status: Within functional limits for tasks assessed     SENSATION: WFL    MUSCLE LENGTH: Hamstrings: Right wfl deg; Left wfl deg Debby test: Right nt deg; Left  nt deg  POSTURE: marked valgum B knees, greater on R  PALPATION: Tender L lateral hip musculature, TFL Tender R glut medius, minimus,  LOWER EXTREMITY ROM:  Passive ROM Right eval Left eval  Hip flexion 105 95  Hip extension    Hip abduction    Hip adduction    Hip internal rotation    Hip external rotation    Knee flexion 112 102  Knee extension -3 -12  Ankle dorsiflexion    Ankle plantarflexion    Ankle inversion    Ankle eversion     (Blank rows = not tested)  LOWER EXTREMITY MMT:  MMT Right eval Left eval Right 11/24/23 Left 11/24/23  Hip flexion 4- 4- 4 4  Hip extension      Hip abduction 3+ 3- 4 4  Hip adduction      Hip internal rotation      Hip external rotation  Knee flexion      Knee extension      Ankle dorsiflexion      Ankle plantarflexion 4- 4-    Ankle inversion      Ankle eversion       (Blank rows = not tested)   FUNCTIONAL TESTS:  Timed up and go (TUG): 18.35 11/24/23:  TUG 15s   GAIT: Distance walked: 78' in clinic, no device, labored gait, slowed cadence decreased stride length B                                                                                                                              TREATMENT DATE:  [facility] Adult PT Treatment:                                             Date: 12/03/2023 Pt seen for aquatic therapy today.  Treatment took place in water 3.5-4.75 ft in depth at the [facility] pool. Temp of water was 91.  Pt entered/exited the pool via stairs independently with bil rail.  - unsupported:  walking/backward x 3 laps with reciprocal row motion, - side stepping with arm horiz add/abdct with rainbow hand floats 2 laps -> add/abdct x 2 laps - suitcase carry with bil rainbow hand floats at side  under water then unilaterally, walking forward/ backward - short squatted rest at wall -L stretch - return to walking with reciprocal arm swing, forward/backward - UE on rainbow hand floats 4.3 ft: toe/heel raises x 12; hip abdct/ add x 12 each; hip flexion/ extension x 10 each (improving);  - squatted rest period - hip crosses (balance challenge)x 10 each -vertical suspension ue on corner wall (yellow noodle then white) trial of anteriorly then post across chest: cycling; hip add/abd and flex/ext. *decompression in above position  11/21/23 10th visit PN -LEFS -TUG -MMT NuStep L5x48mins  Step ups 6  Resisted gait 30# forwards x4, side steps x3 STS 2x10 HEP   [facility] Adult PT Treatment:                                             Date: 11/19/2023 Pt seen for aquatic therapy today.  Treatment took place in water 3.5-4.75 ft in depth at the [facility] pool. Temp of water was 91.  Pt entered/exited the pool via stairs independently with bil rail.  - unsupported:  walking/backward x 4 laps with reciprocal row motion, - side stepping with arm add/abdct with short hollow noodles x 2 laps - TrA set with short hollow noodle pull down to thighs in staggered stance x 5 each - suitcase carry at 4 ft, with bil short hollow noodles under water -> single rainbow hand float at  side, walking forward/backward - UE on yellow hand floats: toe/heel raises x 15; alternating hamstring curls x 10 each LE; hip abdct/ add 3x5; hip crosses x 10 each - short rest in squatted position - UE on wall: single leg clams x 10 each; (at 46ft6) wide leg, side to side lunge hold for adductor stretch; (at 28ft 8) hip flexion /extension x10; hip circles x 10 (5 CW/5CCW)  - forward walking kick - STS from 3rd step in water, x 5 with cues for neutral head, core engaged, forward arm reach  [facility] Adult PT Treatment:                                             Date: 11/17/2023 Pt seen for aquatic therapy today.  Treatment  took place in water 3.5-4.75 ft in depth at the [facility] pool. Temp of water was 91.  Pt entered/exited the pool via stairs independently with bil rail.  - unsupported:  walking/backward x 3 laps with reciprocal row motion, side stepping with arm add/abdct with short hollow noodles x 3 laps - squatted rest at wall - UE on wall: alternating hamstring curls x 10 each LE; single leg clams x 10 each (improved); toe/heel raises x 20; hip flexion /extension 2x10; hip abdct/ add 2x10; squatted rest; hip crosses x 10 each - UE on yellow hand floats: forward walking kicks  - suitcase carry at 4 ft, with bil short hollow noodles under water -> single rainbow hand float at side, walking forward/backward - STS from 3rd step in water, x 5 with cues for neutral head, core engaged, forward arm reach  [facility] Adult PT Treatment:                                             Date: 11/14/2023 Pt seen for aquatic therapy today.  Treatment took place in water 3.5-4.75 ft in depth at the [facility] pool. Temp of water was 91.  Pt entered/exited the pool via stairs independently with bil rail.  - unsupported:  walking/backward x 3 laps with reciprocal row motion, side stepping 2 laps with horiz abdct/add; side stepping with arm add/abdct with short hollow noodles x 1 lap - UE on wall: alternating hamstring curls x 10 each LE; toe/heel raises x 20;  single leg clams x 10 each (limited ROM); hip crosses x 10 each; hip abdct/ add 2x10 - return to walking with relaxed arms - UE on yellow hand floats: forward walking kicks  - marching with reciprocal row motion with short hollow noodles -> forward walking kicks - TrA set with short hollow noodle pull down to thighs x 3 -> long hollow noodle x 8 (pain in R shoulder) - STS at bench in water with feet on blue step, x 10 with cues for neutral head and core engaged    [facility] Adult PT Treatment:                                             Date: 11/07/2023 Pt  seen for aquatic therapy today.  Treatment took place in water 3.5-4.75 ft in depth at the [facility]. Temp of water was 91.  Pt entered/exited the pool via stairs independently with bil rail.  - unsupported:  walking/backward x 3 laps, side stepping 2 laps -side stepping with arm add/abdct with short hollow noodles x 1 lap - marching with reciprocal row motion with short hollow noodles x 2 laps - squatted rest at wall - UE on wall:  toe/heel raises x 15; hip add/abdct x10 ; hip flexion /extension x10;  - return to walking forward/ backward 2 laps  -UE on wall - 10 squats  - TrA set with short hollow noodle pull down to thighs x 10; in staggered stance x 5 each (good balance challenge) - at rails:  L stretch with knees bent x 20sec, 2nd rep with gentle tail wag;  L/R hamstring stretch with foot on 2nd step standing up straight x 20sec each  [facility] Adult PT Treatment:                                             Date: 11/05/2023 Pt seen for aquatic therapy today.  Treatment took place in water 3.5-4.75 ft in depth at the [facility] pool. Temp of water was 91.  Pt entered/exited the pool via stairs independently with bil rail.  - Intro to aquatic therapy principles - UE on barbell:  walking forward x 3 laps, side stepping 2 laps, backward 2 laps - squatted rest at wall - UE on yellow noodle: walking forward/ backward 2 laps, side stepping 1 lap - UE on yellow hand floats: high knee marching backward/ forward and sideways - UE on wall:  toe/heel raises x 10; hip add/abdct x10 ; hip flexion /extension x10;  -squatted rest at wall - TrA set with short hollow noodle pull down to thighs x 10  - yellow noodle under arms with UE on wall-> white noodle under arms with UE on corner: cycling (difficulty maintaining neutral trunk)  10/29/23:  Nustep L 5 x 7 min x 4 exts  Yellow t band B shoulder ER  Yellow t  band rows band under feet B In ll bars with blue t band around  thighs, for alt hip abd 15 x In ll bars, blue t band around thighs for alt SLR, opp hip ext ,rocking forward and back Knee flexion B 35# 3 x 10  Knee ext 10# 3 x 10 Supine for 65 cm ball , B knee to chest, LTR, alt SLR, bridging all 15x Side lying R for deep cross friction massage L lateral hip musculature with theragun   10/27/23:  Knee ext B,10 # 3 x 10 Knee flex B 35# 3 x 10 In ll bars for heel/toe rocks on airex 15 x In ll bars with blue t band around thighs, for alt hip abd 15 x In ll bars, blue t band around thighs for alt SLR, opp hip ext ,rocking forward and back Nustep L 5 x 6 min x 4 exts Side lying for deep cross friction massage L lateral hip over TFL, glut medius with theragun Supine with 65 cm physioball under feet for B knee to chest, LTR, bridging all 10 x  Seated for brief assessment of R shoulder, B shoulder flexion limited, R to 90, L to 110 B shoulder IR/ER wnl MMT B shoulders with pain and weakness R ER, B shoulder flex and abd   10/08/23:  Eval:  side lying on each side for theragun each hip lateral musculature with improved Sx.  PATIENT EDUCATION:  Education details: exercise rationale, progression and modification  Person educated: Patient  Education method: Explanation, demonstration Education comprehension: verbalized understanding  HOME EXERCISE PROGRAM: Access Code: [email or web address] URL: [email or web address] Date: 11/24/2023 Prepared by: Almetta Fam  Exercises - Step Up  - 1 x daily - 7 x weekly - 2 sets - 10 reps - Lateral Step Up  - 1 x daily - 7 x weekly - 2 sets - 10 reps - Sit to Stand  - 1 x daily - 7 x weekly - 2 sets - 10 reps - Seated Long Arc Quad  - 1 x daily - 7 x weekly - 2 sets - 10 reps  ASSESSMENT:  CLINICAL IMPRESSION:  Continued improvement with dynamic SLS. She is able to get into decompression position with ue support corner wall.  Trial gaining position without ue support, some difficulty but good pain relief  gained. Reduction of pain  slightly end of session .  Goals ongoing     Eval: Patient is a 68 y.o. female who was evaluated today for physical therapy due to chronic pain multiple jts. Presents with postural changes remarkably B knee valgum, also weakness B hips, decreased walking tolerance.  Would benefit from PT with combination of manual techniques, therex, modalities to improve sx and work with her [facility] We discussed aquatic therapy due to multiple jt involvement and she is very interested in pursuing this aspect of PT  OBJECTIVE IMPAIRMENTS: cardiopulmonary status limiting activity, decreased activity tolerance, decreased balance, decreased endurance, decreased knowledge of condition, decreased mobility, difficulty walking, decreased ROM, decreased strength, increased fascial restrictions, impaired perceived functional ability, postural dysfunction, and pain.   ACTIVITY LIMITATIONS: carrying, standing, squatting, stairs, transfers, and locomotion level  PARTICIPATION LIMITATIONS: cleaning, laundry, shopping, community activity, and yard work  PERSONAL FACTORS: Behavior pattern, Time since onset of injury/illness/exacerbation, and 1-2 comorbidities: COPD are also affecting patient's functional outcome.   REHAB POTENTIAL: Good  CLINICAL DECISION MAKING: Evolving/moderate complexity  EVALUATION COMPLEXITY: Moderate   GOALS: Goals reviewed with patient? Yes  SHORT TERM GOALS: Target date: 2 weeks, 10/22/23 I HEP Baseline: Goal status: just uses small pedal machine at home 11/24/23   LONG TERM GOALS: Target date: 01/01/24, 12 weeks  LEFSLower Extremity Functional Score: 35 / 80 = 43.8 % improve to 30%or less disability Baseline:  Goal status: 25/80 going 11/24/23  2.  TUG improve from 18 to 14 or less Baseline:  Goal status: 15s 11/24/23  3.  Strength B hip abd, ext 5/5 Baseline: 3+ and 4-/5 Goal status: ongoing  11/24/23     PLAN:  PT FREQUENCY: 2x/week  PT DURATION: 12 weeks  PLANNED INTERVENTIONS: 97110-Therapeutic exercises, 97530- Therapeutic activity, 97112- Neuromuscular re-education, 97535- Self Care, [id number]- Manual therapy, [phone]- Aquatic Therapy, and Patient/Family education  PLAN FOR NEXT SESSION: assess response to aquatic therapy session Delon Aquas, PTA 12/03/23 4:17 PM [facility] [address] [place], [place], [address] Phone: [phone]   Fax:  [phone]

## 2023-12-04 ENCOUNTER — Other Ambulatory Visit: Payer: Self-pay

## 2023-12-04 NOTE — Patient Outreach (Signed)
 Complex Care Management   Visit Note  12/04/2023  Name:  Rose Miller MRN: [id number] DOB: 1955/06/10  Situation: Patient reports that she has been established with Dr. Aliene Eagles. However, she is looking to change physicians. She states that she is not going to be returning to [facility]. She denies any health questions or concerns at this time. RNCM provided contact number to provider referral line [phone]) and advised her to also contact her insurance company to see what providers are in her network. Patient no longer meets criteria for the program. RNCM informed patient that if CM needed in the future, possible that in network provider could do another referral to care management department. Ms. leaira fullam [facility] for the information.  Follow Up Plan: Patient no longer meets criteria Closing From:  Complex Care Management  Heddy Shutter, RN, MSN, BSN, CCM [place]  [facility], Population Health Case Manager Phone: [phone]

## 2023-12-05 ENCOUNTER — Encounter (HOSPITAL_BASED_OUTPATIENT_CLINIC_OR_DEPARTMENT_OTHER): Payer: Self-pay | Admitting: Cardiovascular Disease

## 2023-12-05 ENCOUNTER — Ambulatory Visit (HOSPITAL_BASED_OUTPATIENT_CLINIC_OR_DEPARTMENT_OTHER): Admitting: Cardiovascular Disease

## 2023-12-05 VITALS — BP 126/68 | HR 70 | Ht 70.0 in | Wt 354.9 lb

## 2023-12-05 DIAGNOSIS — I700 Atherosclerosis of aorta: Secondary | ICD-10-CM | POA: Diagnosis not present

## 2023-12-05 DIAGNOSIS — I480 Paroxysmal atrial fibrillation: Secondary | ICD-10-CM | POA: Diagnosis not present

## 2023-12-05 DIAGNOSIS — I2584 Coronary atherosclerosis due to calcified coronary lesion: Secondary | ICD-10-CM

## 2023-12-05 DIAGNOSIS — N183 Chronic kidney disease, stage 3 unspecified: Secondary | ICD-10-CM

## 2023-12-05 DIAGNOSIS — E1122 Type 2 diabetes mellitus with diabetic chronic kidney disease: Secondary | ICD-10-CM | POA: Diagnosis not present

## 2023-12-05 DIAGNOSIS — I2510 Atherosclerotic heart disease of native coronary artery without angina pectoris: Secondary | ICD-10-CM

## 2023-12-05 NOTE — Patient Instructions (Addendum)
 Medication Instructions:  Your physician recommends that you continue on your current medications as directed. Please refer to the Current Medication list given to you today.   *If you need a refill on your cardiac medications before your next appointment, please call your pharmacy*  Lab Work: NONE  Testing/Procedures: NONE  Follow-Up: At [facility], you and your health needs are our priority.  As part of our continuing mission to provide you with exceptional heart care, our providers are all part of one team.  This team includes your primary Cardiologist (physician) and Advanced Practice Providers or APPs (Physician Assistants and Nurse Practitioners) who all work together to provide you with the care you need, when you need it.  Your next appointment:   12 month(s)  Provider:   Annabella Scarce, MD, Rosaline Bane, NP, or Reche Finder, NP    We recommend signing up for the patient portal called MyChart.  Sign up information is provided on this After Visit Summary.  MyChart is used to connect with patients for Virtual Visits (Telemedicine).  Patients are able to view lab/test results, encounter notes, upcoming appointments, etc.  Non-urgent messages can be sent to your provider as well.   To learn more about what you can do with MyChart, go to [email or web address].   Other Instructions Exercise recommendations: The American Heart Association recommends 150 minutes of moderate intensity exercise weekly. Try 30 minutes of moderate intensity exercise 4-5 times per week. This could include walking, jogging, or swimming.  MONITOR YOUR BLOOD PRESSURE DAILY. CALL THE OFFICE IF IT IS NOT CONSISTENTLY BELOW 130/80  Primary Care:  Dr. Catheryn Slocumb Dr. Clotilda Single

## 2023-12-05 NOTE — Progress Notes (Signed)
 Cardiology Office Note:  .   Date:  12/05/2023  ID:  Rose Miller, Rose Miller Jan 17, 1955, MRN [id number] PCP: Sebastian Beverley NOVAK, MD   HeartCare Providers Cardiologist:  Annabella Scarce, MD    History of Present Illness: .   Rose Miller is a 68 y.o. female with aortic stenosis status post TAVR (12/2020), post-operative atrial fibrillation, mild nonobstructive CAD, PAF, hypertension, hyperlipidemia, COPD, and morbid obesity (BMI 51) here for follow-up.  She was previously a patient of Dr. Claudene.  Echo in 2022 revealed LVEF 70% with moderate to severe aortic stenosis.  She underwent cardiac catheterization was found to have mild nonobstructive CAD.  She proceeded to have a 26 mm Edwards SAPIEN 3 ultra Resilia TAVR valve implanted 12/2020.  Postoperatively echo revealed LVEF 70% with a normally functioning TAVR valve and a mean gradient of 17.7 mmHg.  She developed atrial fibrillation 12/2020.  She was started on Eliquis  and given diltiazem  to take as needed.  After her TAVR valve she had episodes of bradycardia and metoprolol  had been discontinued.  She saw Powell Sorrow, MD 03/2022.  At the time she was feeling well.  She followed up with structural heart 12/2022 and they were working to help her get on Mounjaro as she had previously failed Ozempic  and Rybelsus .  03/2023 revealed LVEF 65-70% with mean gradient of 13 mmHg across bioprosthetic aortic valve.  There is no aortic regurgitation.  At her visit 03/2023 she was doing well. She was referred to the PREP program.  Discussed the use of AI scribe software for clinical note transcription with the patient, who gave verbal consent to proceed.  History of Present Illness Rose Miller has been experiencing elevated blood pressure readings, consistently around 130 mmHg, but not reaching 140 mmHg. She is currently on multiple antihypertensive medications, including amlodipine , hydralazine , losartan , spironolactone , and metoprolol .  She is  actively participating in physical therapy, specifically aquatic therapy, which she attends once a week for 45 minutes. She plans to increase her sessions to twice a week next month. She is cautious about her salt intake, although she suspects a recent increase in blood pressure may have been due to consuming something with too much salt.  Her past medical history includes a requirement to take amoxicillin  prior to dental procedures. She is under the care of a structural heart team, with an echocardiogram performed in March and another scheduled for this month.  No new symptoms or concerns are reported, and she feels better overall. Ankle swelling persists but is not as severe as before.  ROS:  As per HPI  Studies Reviewed: .       Echo 04/07/23:  1. Left ventricular ejection fraction, by estimation, is 65 to 70%. The  left ventricle has normal function. The left ventricle has no regional  wall motion abnormalities. There is moderate concentric left ventricular  hypertrophy. Left ventricular  diastolic parameters are indeterminate.   2. Right ventricular systolic function is normal. The right ventricular  size is normal. Tricuspid regurgitation signal is inadequate for assessing  PA pressure.   3. Left atrial size was mildly dilated.   4. The mitral valve is normal in structure. Trivial mitral valve  regurgitation. No evidence of mitral stenosis.   5. The aortic valve has been repaired/replaced. Aortic valve  regurgitation is not visualized. There is a 26 mm Sapien prosthetic (TAVR)  valve present in the aortic position. Procedure Date: 12/26/2020. Echo   Risk Assessment/Calculations:  Physical Exam:   VS:  BP 138/84 (BP Location: Left Arm, Patient Position: Sitting, Cuff Size: Large)   Pulse 70   Ht 5' 10 (1.778 m)   Wt (!) 354 lb 14.4 oz (161 kg)   SpO2 97%   BMI 50.92 kg/m  , BMI Body mass index is 50.92 kg/m. GENERAL:  Well appearing HEENT: Pupils equal round  and reactive, fundi not visualized, oral mucosa unremarkable NECK:  No jugular venous distention, waveform within normal limits, carotid upstroke brisk and symmetric, no bruits, no thyromegaly LUNGS:  Clear to auscultation bilaterally HEART:  RRR.  PMI not displaced or sustained,S1 and S2 within normal limits, no S3, no S4, no clicks, no rubs, II/VI systolic murmur ABD:  Flat, positive bowel sounds normal in frequency in pitch, no bruits, no rebound, no guarding, no midline pulsatile mass, no hepatomegaly, no splenomegaly EXT:  2 plus pulses throughout, no edema, no cyanosis no clubbing SKIN:  No rashes no nodules NEURO:  Cranial nerves II through XII grossly intact, motor grossly intact throughout PSYCH:  Cognitively intact, oriented to person place and time   ASSESSMENT AND PLAN: .    Assessment & Plan # Hypertension: Blood pressure readings in the 130s, recent 126/68. Current medications: amlodipine , hydralazine , losartan , spironolactone , metoprolol . Discussed ARB switch, patient prefers monitoring. Emphasized lifestyle modifications. - Increase exercise to 150 minutes per week. - Monitor salt intake. - Contact provider if blood pressure remains in the 130s/80s for medication adjustment.  # Aortic stenosis s/p TAVR:  Monitored by structural heart team. Recent echocardiogram in March, another next scheduled this month per structural heart team. - Continue follow-up with structural heart team.  # PAF: Post-operative atrial fibrillation.  She is unaware of when she is in atrial fibrillation.  Continue Eliquis , diltiazem , and metoprolol .  # HFpEF: # Lower extremity edema Persistent ankle swelling.  Stable and mild.  No heart failure symptoms.   -BP control as above.  Continue Jardiance  and lasix .       Dispo: f/u in 1 year  Signed, Annabella Scarce, MD

## 2023-12-08 ENCOUNTER — Other Ambulatory Visit (HOSPITAL_COMMUNITY): Payer: Self-pay

## 2023-12-08 NOTE — Telephone Encounter (Signed)
 1 box of Ozempic  8mg /31ml arrived to the office for pick up.

## 2023-12-09 ENCOUNTER — Other Ambulatory Visit (HOSPITAL_COMMUNITY): Payer: Self-pay

## 2023-12-09 DIAGNOSIS — L600 Ingrowing nail: Secondary | ICD-10-CM | POA: Diagnosis not present

## 2023-12-09 DIAGNOSIS — I739 Peripheral vascular disease, unspecified: Secondary | ICD-10-CM | POA: Diagnosis not present

## 2023-12-09 DIAGNOSIS — L03031 Cellulitis of right toe: Secondary | ICD-10-CM | POA: Diagnosis not present

## 2023-12-09 DIAGNOSIS — E1142 Type 2 diabetes mellitus with diabetic polyneuropathy: Secondary | ICD-10-CM | POA: Diagnosis not present

## 2023-12-09 DIAGNOSIS — M792 Neuralgia and neuritis, unspecified: Secondary | ICD-10-CM | POA: Diagnosis not present

## 2023-12-09 DIAGNOSIS — B351 Tinea unguium: Secondary | ICD-10-CM | POA: Diagnosis not present

## 2023-12-09 DIAGNOSIS — M2041 Other hammer toe(s) (acquired), right foot: Secondary | ICD-10-CM | POA: Diagnosis not present

## 2023-12-09 DIAGNOSIS — R600 Localized edema: Secondary | ICD-10-CM | POA: Diagnosis not present

## 2023-12-09 NOTE — Telephone Encounter (Signed)
 Pt picked up 2mg  box.

## 2023-12-10 ENCOUNTER — Ambulatory Visit (HOSPITAL_BASED_OUTPATIENT_CLINIC_OR_DEPARTMENT_OTHER): Payer: Self-pay | Admitting: Physical Therapy

## 2023-12-10 ENCOUNTER — Encounter (HOSPITAL_BASED_OUTPATIENT_CLINIC_OR_DEPARTMENT_OTHER): Payer: Self-pay | Admitting: Physical Therapy

## 2023-12-10 DIAGNOSIS — G8929 Other chronic pain: Secondary | ICD-10-CM

## 2023-12-10 DIAGNOSIS — M5459 Other low back pain: Secondary | ICD-10-CM

## 2023-12-10 DIAGNOSIS — R262 Difficulty in walking, not elsewhere classified: Secondary | ICD-10-CM

## 2023-12-10 NOTE — Therapy (Signed)
 OUTPATIENT PHYSICAL THERAPY LOWER EXTREMITY TREATMENT    Patient Name: Rose Miller MRN: [id number] DOB:1955/08/23, 68 y.o., female Today's Date: 12/10/2023  END OF SESSION:  PT End of Session - 12/10/23 0855     Visit Number 13    Date for Recertification  01/01/24    Progress Note Due on Visit 20    PT Start Time 0850    PT Stop Time 0930    PT Time Calculation (min) 40 min    Activity Tolerance Patient tolerated treatment well    Behavior During Therapy [facility] for tasks assessed/performed              Past Medical History:  Diagnosis Date   Adhesive capsulitis    Aortic valve disease    Arthritis    Asthma    Back pain    CKD (chronic kidney disease) stage 3, GFR 30-59 ml/min (HCC)    Constipation    COPD (chronic obstructive pulmonary disease) (HCC)    Diabetes mellitus without complication (HCC) 10 20 2020   Diuretic-induced hypokalemia    Heart murmur    Hypercholesterolemia    Hypertension    IT band syndrome    Morbid obesity (HCC)    Palpitations    Periumbilical pain    Postmenopausal estrogen deficiency    Prediabetes    S/P TAVR (transcatheter aortic valve replacement) 12/26/2020   Ventral hernia    Past Surgical History:  Procedure Laterality Date   BIOPSY  05/01/2021   Procedure: BIOPSY;  Surgeon: Charlanne Groom, MD;  Location: [facility];  Service: Gastroenterology;;   CARDIAC CATHETERIZATION  11/24/2020   CESAREAN SECTION  1989   COLONOSCOPY     COLONOSCOPY WITH PROPOFOL  N/A 05/01/2021   Procedure: COLONOSCOPY WITH PROPOFOL ;  Surgeon: Charlanne Groom, MD;  Location: [facility];  Service: Gastroenterology;  Laterality: N/A;   ESOPHAGOGASTRODUODENOSCOPY (EGD) WITH PROPOFOL  N/A 05/01/2021   Procedure: ESOPHAGOGASTRODUODENOSCOPY (EGD) WITH PROPOFOL ;  Surgeon: Charlanne Groom, MD;  Location: [facility];  Service: Gastroenterology;  Laterality: N/A;   INTRAOPERATIVE TRANSTHORACIC ECHOCARDIOGRAM N/A 12/26/2020   Procedure: INTRAOPERATIVE  TRANSTHORACIC ECHOCARDIOGRAM;  Surgeon: Wendel Lurena POUR, MD;  Location: [facility];  Service: Open Heart Surgery;  Laterality: N/A;   MALONEY DILATION  05/01/2021   Procedure: AGAPITO DILATION;  Surgeon: Charlanne Groom, MD;  Location: [facility];  Service: Gastroenterology;;   POLYPECTOMY  05/01/2021   Procedure: POLYPECTOMY;  Surgeon: Charlanne Groom, MD;  Location: [facility];  Service: Gastroenterology;;   RIGHT/LEFT HEART CATH AND CORONARY ANGIOGRAPHY N/A 11/24/2020   Procedure: RIGHT/LEFT HEART CATH AND CORONARY ANGIOGRAPHY;  Surgeon: Claudene Victory ORN, MD;  Location: [facility]IVE CV LAB;  Service: Cardiovascular;  Laterality: N/A;   TONSILLECTOMY AND ADENOIDECTOMY  1960   TRANSCATHETER AORTIC VALVE REPLACEMENT, TRANSFEMORAL N/A 12/26/2020   Procedure: TRANSCATHETER AORTIC VALVE REPLACEMENT, TRANSFEMORAL;  Surgeon: Wendel Lurena POUR, MD;  Location: [facility];  Service: Open Heart Surgery;  Laterality: N/A;   Patient Active Problem List   Diagnosis Date Noted   Acquired hammer toe of right foot 09/26/2023   Atherosclerosis of artery of both lower extremities 09/26/2023   Polyneuropathy due to type 2 diabetes mellitus (HCC) 09/26/2023   Primary localized osteoarthrosis of ankle and foot 09/26/2023   Kidney lesion 04/14/2023   Abscess of lower back 03/17/2023   Ingrown nail of great toe of left foot 12/16/2022   Vitamin D  deficiency 12/16/2022   Low back pain 12/16/2022   Chronic pain of both knees  12/16/2022   Dyspnea on exertion 12/16/2022   Coronary artery disease due to calcified coronary lesion 12/16/2022   Aortic atherosclerosis 12/16/2022   PAF (paroxysmal atrial fibrillation) (HCC) 03/14/2022   Otalgia of left ear 03/14/2022   Diplopia 09/12/2021   Allergic rhinitis 09/12/2021   Pharyngoesophageal dysphagia    Family history of colon cancer in father    Aortic valve disease 12/26/2020   COPD (chronic obstructive pulmonary disease) (HCC) 12/26/2020    Postmenopausal estrogen deficiency 02/18/2019   Former smoker 05/13/2018   Long-term use of aspirin  therapy 05/13/2018   Peripheral vascular disease 05/05/2018   Prediabetes 05/05/2018   Type 2 diabetes mellitus in patient with obesity (HCC) 03/31/2017   CKD stage 3 secondary to diabetes (HCC) 03/31/2017   Hypercholesterolemia 06/28/2016   Aortic valve stenosis 03/29/2016   Periumbilical pain 03/29/2016   IT band syndrome 09/11/2014   Right shoulder pain 09/11/2014   Ventral hernia 05/01/2014   Morbid obesity (HCC) 03/17/2014    PCP: Sebastian Righter MD  REFERRING PROVIDER: same  REFERRING DIAG: lower back, B knee, L hip pain  THERAPY DIAG:  Difficulty in walking, not elsewhere classified  Other low back pain  Chronic pain of both knees  Rationale for Evaluation and Treatment: Rehabilitation  ONSET DATE: December 2024  SUBJECTIVE:   SUBJECTIVE STATEMENT: I just have some stiffness maybe 2/10.  Discussed returning to [facility] for pool access  POOL ACCESS:  Pt is member of [employer].    PERTINENT HISTORY: Referred by PCP due to pain complaints knees, hips lower back PAIN:  Are you having pain? Yes: NPRS scale: 3-4/10 Pain location: lateral hips, B knees, lower back  --(and Rt shoulder 3/10) Pain description: deep pain with walking Aggravating factors: walking Relieving factors: resting  PRECAUTIONS: Fall  RED FLAGS: None   WEIGHT BEARING RESTRICTIONS: No  FALLS:  Has patient fallen in last 6 months? No  LIVING ENVIRONMENT: Lives with: lives with their son Lives in: House/apartment Stairs: steps outdoors Has following equipment at home: None  OCCUPATION: retired  PLOF: Independent  PATIENT GOALS: be able to walk without pain  NEXT MD VISIT: 1 month  OBJECTIVE:  Note: Objective measures were completed at Evaluation unless otherwise noted.  DIAGNOSTIC FINDINGS: na  PATIENT SURVEYS:  Lower Extremity Functional Score: 35 / 80 = 43.8 % 11/24/23: 25/80  = COGNITION: Overall cognitive status: Within functional limits for tasks assessed     SENSATION: WFL    MUSCLE LENGTH: Hamstrings: Right wfl deg; Left wfl deg Debby test: Right nt deg; Left  nt deg  POSTURE: marked valgum B knees, greater on R  PALPATION: Tender L lateral hip musculature, TFL Tender R glut medius, minimus,  LOWER EXTREMITY ROM:  Passive ROM Right eval Left eval  Hip flexion 105 95  Hip extension    Hip abduction    Hip adduction    Hip internal rotation    Hip external rotation    Knee flexion 112 102  Knee extension -3 -12  Ankle dorsiflexion    Ankle plantarflexion    Ankle inversion    Ankle eversion     (Blank rows = not tested)  LOWER EXTREMITY MMT:  MMT Right eval Left eval Right 11/24/23 Left 11/24/23  Hip flexion 4- 4- 4 4  Hip extension      Hip abduction 3+ 3- 4 4  Hip adduction      Hip internal rotation      Hip external rotation  Knee flexion      Knee extension      Ankle dorsiflexion      Ankle plantarflexion 4- 4-    Ankle inversion      Ankle eversion       (Blank rows = not tested)   FUNCTIONAL TESTS:  Timed up and go (TUG): 18.35 11/24/23:  TUG 15s   GAIT: Distance walked: 24' in clinic, no device, labored gait, slowed cadence decreased stride length B                                                                                                                              TREATMENT DATE:  [facility] Adult PT Treatment:                                             Date: 12/08/2023 Pt seen for aquatic therapy today.  Treatment took place in water 3.5-4.75 ft in depth at the [facility] pool. Temp of water was 91.  Pt entered/exited the pool via stairs independently with bil rail.  - unsupported:  walking/backward x 3 laps with reciprocal row motion, - side stepping with arm horiz add/abdct with rainbow hand floats 2 laps -> add/abdct x 2 laps - suitcase carry with bil rainbow hand floats at side  under water then unilaterally, walking forward/ backward - short squatted rest at wall - UE on rainbow hand floats 4.3 ft: toe/heel raises x 10; hip abdct/ add x 12 each; hip flexion/ extension x 10 each (no difficulty);  - squatted rest period - hip crosses (improved) x 10 each -vertical suspension ue on corner wall white noodle anterior across ches : cycling; hip add/abd and flex/ext. *decompression in above position  11/21/23 10th visit PN -LEFS -TUG -MMT NuStep L5x18mins  Step ups 6  Resisted gait 30# forwards x4, side steps x3 STS 2x10 HEP   [facility] Adult PT Treatment:                                             Date: 11/19/2023 Pt seen for aquatic therapy today.  Treatment took place in water 3.5-4.75 ft in depth at the [facility] pool. Temp of water was 91.  Pt entered/exited the pool via stairs independently with bil rail.  - unsupported:  walking/backward x 4 laps with reciprocal row motion, - side stepping with arm add/abdct with short hollow noodles x 2 laps - TrA set with short hollow noodle pull down to thighs in staggered stance x 5 each - suitcase carry at 4 ft, with bil short hollow noodles under water -> single rainbow hand float at side, walking forward/backward - UE on yellow hand floats: toe/heel raises x 15; alternating hamstring  curls x 10 each LE; hip abdct/ add 3x5; hip crosses x 10 each - short rest in squatted position - UE on wall: single leg clams x 10 each; (at 92ft6) wide leg, side to side lunge hold for adductor stretch; (at 43ft 8) hip flexion /extension x10; hip circles x 10 (5 CW/5CCW)  - forward walking kick - STS from 3rd step in water, x 5 with cues for neutral head, core engaged, forward arm reach  [facility] Adult PT Treatment:                                             Date: 11/17/2023 Pt seen for aquatic therapy today.  Treatment took place in water 3.5-4.75 ft in depth at the [facility] pool. Temp of water was 91.  Pt entered/exited  the pool via stairs independently with bil rail.  - unsupported:  walking/backward x 3 laps with reciprocal row motion, side stepping with arm add/abdct with short hollow noodles x 3 laps - squatted rest at wall - UE on wall: alternating hamstring curls x 10 each LE; single leg clams x 10 each (improved); toe/heel raises x 20; hip flexion /extension 2x10; hip abdct/ add 2x10; squatted rest; hip crosses x 10 each - UE on yellow hand floats: forward walking kicks  - suitcase carry at 4 ft, with bil short hollow noodles under water -> single rainbow hand float at side, walking forward/backward - STS from 3rd step in water, x 5 with cues for neutral head, core engaged, forward arm reach  [facility] Adult PT Treatment:                                             Date: 11/14/2023 Pt seen for aquatic therapy today.  Treatment took place in water 3.5-4.75 ft in depth at the [facility] pool. Temp of water was 91.  Pt entered/exited the pool via stairs independently with bil rail.  - unsupported:  walking/backward x 3 laps with reciprocal row motion, side stepping 2 laps with horiz abdct/add; side stepping with arm add/abdct with short hollow noodles x 1 lap - UE on wall: alternating hamstring curls x 10 each LE; toe/heel raises x 20;  single leg clams x 10 each (limited ROM); hip crosses x 10 each; hip abdct/ add 2x10 - return to walking with relaxed arms - UE on yellow hand floats: forward walking kicks  - marching with reciprocal row motion with short hollow noodles -> forward walking kicks - TrA set with short hollow noodle pull down to thighs x 3 -> long hollow noodle x 8 (pain in R shoulder) - STS at bench in water with feet on blue step, x 10 with cues for neutral head and core engaged    [facility] Adult PT Treatment:                                             Date: 11/07/2023 Pt seen for aquatic therapy today.  Treatment took place in water 3.5-4.75 ft in depth at the [facility] pool.  Temp of water was 91.  Pt entered/exited the pool via stairs independently  with bil rail.  - unsupported:  walking/backward x 3 laps, side stepping 2 laps -side stepping with arm add/abdct with short hollow noodles x 1 lap - marching with reciprocal row motion with short hollow noodles x 2 laps - squatted rest at wall - UE on wall:  toe/heel raises x 15; hip add/abdct x10 ; hip flexion /extension x10;  - return to walking forward/ backward 2 laps  -UE on wall - 10 squats  - TrA set with short hollow noodle pull down to thighs x 10; in staggered stance x 5 each (good balance challenge) - at rails:  L stretch with knees bent x 20sec, 2nd rep with gentle tail wag;  L/R hamstring stretch with foot on 2nd step standing up straight x 20sec each  [facility] Adult PT Treatment:                                             Date: 11/05/2023 Pt seen for aquatic therapy today.  Treatment took place in water 3.5-4.75 ft in depth at the [facility] pool. Temp of water was 91.  Pt entered/exited the pool via stairs independently with bil rail.  - Intro to aquatic therapy principles - UE on barbell:  walking forward x 3 laps, side stepping 2 laps, backward 2 laps - squatted rest at wall - UE on yellow noodle: walking forward/ backward 2 laps, side stepping 1 lap - UE on yellow hand floats: high knee marching backward/ forward and sideways - UE on wall:  toe/heel raises x 10; hip add/abdct x10 ; hip flexion /extension x10;  -squatted rest at wall - TrA set with short hollow noodle pull down to thighs x 10  - yellow noodle under arms with UE on wall-> white noodle under arms with UE on corner: cycling (difficulty maintaining neutral trunk)  10/29/23:  Nustep L 5 x 7 min x 4 exts  Yellow t band B shoulder ER  Yellow t  band rows band under feet B In ll bars with blue t band around thighs, for alt hip abd 15 x In ll bars, blue t band around thighs for alt SLR, opp hip ext ,rocking forward and  back Knee flexion B 35# 3 x 10  Knee ext 10# 3 x 10 Supine for 65 cm ball , B knee to chest, LTR, alt SLR, bridging all 15x Side lying R for deep cross friction massage L lateral hip musculature with theragun   10/27/23:  Knee ext B,10 # 3 x 10 Knee flex B 35# 3 x 10 In ll bars for heel/toe rocks on airex 15 x In ll bars with blue t band around thighs, for alt hip abd 15 x In ll bars, blue t band around thighs for alt SLR, opp hip ext ,rocking forward and back Nustep L 5 x 6 min x 4 exts Side lying for deep cross friction massage L lateral hip over TFL, glut medius with theragun Supine with 65 cm physioball under feet for B knee to chest, LTR, bridging all 10 x  Seated for brief assessment of R shoulder, B shoulder flexion limited, R to 90, L to 110 B shoulder IR/ER wnl MMT B shoulders with pain and weakness R ER, B shoulder flex and abd   10/08/23:  Eval:  side lying on each side for theragun each hip lateral musculature with improved  Sx.  PATIENT EDUCATION:  Education details: exercise rationale, progression and modification  Person educated: Patient  Education method: Explanation, demonstration Education comprehension: verbalized understanding  HOME EXERCISE PROGRAM: Access Code: [email or web address] URL: [email or web address] Date: 11/24/2023 Prepared by: Almetta Fam  Exercises - Step Up  - 1 x daily - 7 x weekly - 2 sets - 10 reps - Lateral Step Up  - 1 x daily - 7 x weekly - 2 sets - 10 reps - Sit to Stand  - 1 x daily - 7 x weekly - 2 sets - 10 reps - Seated Long Arc Quad  - 1 x daily - 7 x weekly - 2 sets - 10 reps  ASSESSMENT:  CLINICAL IMPRESSION:  Dynamic standing balance completed with little difficulty today despite some increase in knee pain (likely weather related).  She was unable to tolerate grapevine due to knee discomfort. Improved suspended balance using noodle with increased core engagement. Note written to primary PT in regards to appt for end of  POC 12/18. Goals ongoing     Eval: Patient is a 68 y.o. female who was evaluated today for physical therapy due to chronic pain multiple jts. Presents with postural changes remarkably B knee valgum, also weakness B hips, decreased walking tolerance.  Would benefit from PT with combination of manual techniques, therex, modalities to improve sx and work with her [facility] We discussed aquatic therapy due to multiple jt involvement and she is very interested in pursuing this aspect of PT  OBJECTIVE IMPAIRMENTS: cardiopulmonary status limiting activity, decreased activity tolerance, decreased balance, decreased endurance, decreased knowledge of condition, decreased mobility, difficulty walking, decreased ROM, decreased strength, increased fascial restrictions, impaired perceived functional ability, postural dysfunction, and pain.   ACTIVITY LIMITATIONS: carrying, standing, squatting, stairs, transfers, and locomotion level  PARTICIPATION LIMITATIONS: cleaning, laundry, shopping, community activity, and yard work  PERSONAL FACTORS: Behavior pattern, Time since onset of injury/illness/exacerbation, and 1-2 comorbidities: COPD are also affecting patient's functional outcome.   REHAB POTENTIAL: Good  CLINICAL DECISION MAKING: Evolving/moderate complexity  EVALUATION COMPLEXITY: Moderate   GOALS: Goals reviewed with patient? Yes  SHORT TERM GOALS: Target date: 2 weeks, 10/22/23 I HEP Baseline: Goal status: just uses small pedal machine at home 11/24/23   LONG TERM GOALS: Target date: 01/01/24, 12 weeks  LEFSLower Extremity Functional Score: 35 / 80 = 43.8 % improve to 30%or less disability Baseline:  Goal status: 25/80 going 11/24/23  2.  TUG improve from 18 to 14 or less Baseline:  Goal status: 15s 11/24/23  3.  Strength B hip abd, ext 5/5 Baseline: 3+ and 4-/5 Goal status: ongoing 11/24/23     PLAN:  PT FREQUENCY: 2x/week  PT  DURATION: 12 weeks  PLANNED INTERVENTIONS: 97110-Therapeutic exercises, 97530- Therapeutic activity, 97112- Neuromuscular re-education, 97535- Self Care, [id number]- Manual therapy, [phone]- Aquatic Therapy, and Patient/Family education  PLAN FOR NEXT SESSION: progress balance and strengthening  Ronal Foots) Rose Miller MPT 12/10/23 9:37 AM [facility] [address] [place], [place], [address] Phone: [phone]   Fax:  [phone]

## 2023-12-12 ENCOUNTER — Encounter (HOSPITAL_BASED_OUTPATIENT_CLINIC_OR_DEPARTMENT_OTHER): Payer: Self-pay | Admitting: Cardiovascular Disease

## 2023-12-12 DIAGNOSIS — I10 Essential (primary) hypertension: Secondary | ICD-10-CM

## 2023-12-15 ENCOUNTER — Ambulatory Visit
Admission: RE | Admit: 2023-12-15 | Discharge: 2023-12-15 | Disposition: A | Source: Ambulatory Visit | Attending: Family Medicine

## 2023-12-15 DIAGNOSIS — Z1231 Encounter for screening mammogram for malignant neoplasm of breast: Secondary | ICD-10-CM | POA: Diagnosis not present

## 2023-12-15 NOTE — Telephone Encounter (Signed)
 From last office visit Dr. Raford 12/05/23:  # Hypertension: Blood pressure readings in the 130s, recent 126/68. Current medications: amlodipine , hydralazine , losartan , spironolactone , metoprolol . Discussed ARB switch, patient prefers monitoring. Emphasized lifestyle modifications. - Increase exercise to 150 minutes per week. __________________________________________________________

## 2023-12-16 ENCOUNTER — Encounter (HOSPITAL_BASED_OUTPATIENT_CLINIC_OR_DEPARTMENT_OTHER): Payer: Self-pay | Admitting: Physical Therapy

## 2023-12-16 ENCOUNTER — Ambulatory Visit (HOSPITAL_BASED_OUTPATIENT_CLINIC_OR_DEPARTMENT_OTHER): Attending: Family Medicine | Admitting: Physical Therapy

## 2023-12-16 ENCOUNTER — Other Ambulatory Visit: Payer: Self-pay

## 2023-12-16 DIAGNOSIS — M5459 Other low back pain: Secondary | ICD-10-CM | POA: Insufficient documentation

## 2023-12-16 DIAGNOSIS — R262 Difficulty in walking, not elsewhere classified: Secondary | ICD-10-CM | POA: Diagnosis present

## 2023-12-16 DIAGNOSIS — M25562 Pain in left knee: Secondary | ICD-10-CM | POA: Diagnosis present

## 2023-12-16 DIAGNOSIS — G8929 Other chronic pain: Secondary | ICD-10-CM | POA: Diagnosis present

## 2023-12-16 DIAGNOSIS — M25561 Pain in right knee: Secondary | ICD-10-CM | POA: Diagnosis present

## 2023-12-16 DIAGNOSIS — R293 Abnormal posture: Secondary | ICD-10-CM | POA: Diagnosis present

## 2023-12-16 NOTE — Telephone Encounter (Signed)
 PAP: Patient assistance application for Jardiance  has been approved by PAP Companies: [employer] from 01/15/2024 to 01/13/2025. Medication should be delivered to PAP Delivery: Home. For further shipping updates, please contact [employer] at [phone]. Patient ID is: [id number]

## 2023-12-16 NOTE — Therapy (Signed)
 OUTPATIENT PHYSICAL THERAPY LOWER EXTREMITY TREATMENT    Patient Name: Rose Miller MRN: [id number] DOB:04/08/1955, 68 y.o., female Today's Date: 12/16/2023  END OF SESSION:  PT End of Session - 12/16/23 0854     Visit Number 14    Date for Recertification  01/01/24    Progress Note Due on Visit 20    PT Start Time 0845    PT Stop Time 0923    PT Time Calculation (min) 38 min    Activity Tolerance Patient tolerated treatment well    Behavior During Therapy [facility] for tasks assessed/performed              Past Medical History:  Diagnosis Date   Adhesive capsulitis    Aortic valve disease    Arthritis    Asthma    Back pain    CKD (chronic kidney disease) stage 3, GFR 30-59 ml/min (HCC)    Constipation    COPD (chronic obstructive pulmonary disease) (HCC)    Diabetes mellitus without complication (HCC) 10 20 2020   Diuretic-induced hypokalemia    Heart murmur    Hypercholesterolemia    Hypertension    IT band syndrome    Morbid obesity (HCC)    Palpitations    Periumbilical pain    Postmenopausal estrogen deficiency    Prediabetes    S/P TAVR (transcatheter aortic valve replacement) 12/26/2020   Ventral hernia    Past Surgical History:  Procedure Laterality Date   BIOPSY  05/01/2021   Procedure: BIOPSY;  Surgeon: Charlanne Groom, MD;  Location: [facility];  Service: Gastroenterology;;   CARDIAC CATHETERIZATION  11/24/2020   CESAREAN SECTION  1989   COLONOSCOPY     COLONOSCOPY WITH PROPOFOL  N/A 05/01/2021   Procedure: COLONOSCOPY WITH PROPOFOL ;  Surgeon: Charlanne Groom, MD;  Location: [facility];  Service: Gastroenterology;  Laterality: N/A;   ESOPHAGOGASTRODUODENOSCOPY (EGD) WITH PROPOFOL  N/A 05/01/2021   Procedure: ESOPHAGOGASTRODUODENOSCOPY (EGD) WITH PROPOFOL ;  Surgeon: Charlanne Groom, MD;  Location: [facility];  Service: Gastroenterology;  Laterality: N/A;   INTRAOPERATIVE TRANSTHORACIC ECHOCARDIOGRAM N/A 12/26/2020   Procedure: INTRAOPERATIVE  TRANSTHORACIC ECHOCARDIOGRAM;  Surgeon: Wendel Lurena POUR, MD;  Location: [facility];  Service: Open Heart Surgery;  Laterality: N/A;   MALONEY DILATION  05/01/2021   Procedure: AGAPITO DILATION;  Surgeon: Charlanne Groom, MD;  Location: [facility];  Service: Gastroenterology;;   POLYPECTOMY  05/01/2021   Procedure: POLYPECTOMY;  Surgeon: Charlanne Groom, MD;  Location: [facility];  Service: Gastroenterology;;   RIGHT/LEFT HEART CATH AND CORONARY ANGIOGRAPHY N/A 11/24/2020   Procedure: RIGHT/LEFT HEART CATH AND CORONARY ANGIOGRAPHY;  Surgeon: Claudene Victory ORN, MD;  Location: [facility] INVASIVE CV LAB;  Service: Cardiovascular;  Laterality: N/A;   TONSILLECTOMY AND ADENOIDECTOMY  1960   TRANSCATHETER AORTIC VALVE REPLACEMENT, TRANSFEMORAL N/A 12/26/2020   Procedure: TRANSCATHETER AORTIC VALVE REPLACEMENT, TRANSFEMORAL;  Surgeon: Wendel Lurena POUR, MD;  Location: [facility];  Service: Open Heart Surgery;  Laterality: N/A;   Patient Active Problem List   Diagnosis Date Noted   Acquired hammer toe of right foot 09/26/2023   Atherosclerosis of artery of both lower extremities 09/26/2023   Polyneuropathy due to type 2 diabetes mellitus (HCC) 09/26/2023   Primary localized osteoarthrosis of ankle and foot 09/26/2023   Kidney lesion 04/14/2023   Abscess of lower back 03/17/2023   Ingrown nail of great toe of left foot 12/16/2022   Vitamin D  deficiency 12/16/2022   Low back pain 12/16/2022   Chronic pain of both knees  12/16/2022   Dyspnea on exertion 12/16/2022   Coronary artery disease due to calcified coronary lesion 12/16/2022   Aortic atherosclerosis 12/16/2022   PAF (paroxysmal atrial fibrillation) (HCC) 03/14/2022   Otalgia of left ear 03/14/2022   Diplopia 09/12/2021   Allergic rhinitis 09/12/2021   Pharyngoesophageal dysphagia    Family history of colon cancer in father    Aortic valve disease 12/26/2020   COPD (chronic obstructive pulmonary disease) (HCC) 12/26/2020    Postmenopausal estrogen deficiency 02/18/2019   Former smoker 05/13/2018   Long-term use of aspirin  therapy 05/13/2018   Peripheral vascular disease 05/05/2018   Prediabetes 05/05/2018   Type 2 diabetes mellitus in patient with obesity (HCC) 03/31/2017   CKD stage 3 secondary to diabetes (HCC) 03/31/2017   Hypercholesterolemia 06/28/2016   Aortic valve stenosis 03/29/2016   Periumbilical pain 03/29/2016   IT band syndrome 09/11/2014   Right shoulder pain 09/11/2014   Ventral hernia 05/01/2014   Morbid obesity (HCC) 03/17/2014    PCP: Sebastian Righter MD  REFERRING PROVIDER: same  REFERRING DIAG: lower back, B knee, L hip pain  THERAPY DIAG:  Difficulty in walking, not elsewhere classified  Other low back pain  Chronic pain of both knees  Abnormal posture  Rationale for Evaluation and Treatment: Rehabilitation  ONSET DATE: December 2024  SUBJECTIVE:   SUBJECTIVE STATEMENT: I feel pretty good today. I got my xray results yesterday,  moderate arthritis (in Right shoulder). Pt reports she has not had opportunity to check out pools yet.   POOL ACCESS:  Pt is member of [employer].    PERTINENT HISTORY: Referred by PCP due to pain complaints knees, hips lower back PAIN:  Are you having pain? Yes: NPRS scale: 2/10 Pain location: lateral hips, B knees, lower back,Rt shoulder  Pain description: deep pain with walking Aggravating factors: walking Relieving factors: resting  PRECAUTIONS: Fall  RED FLAGS: None   WEIGHT BEARING RESTRICTIONS: No  FALLS:  Has patient fallen in last 6 months? No  LIVING ENVIRONMENT: Lives with: lives with their son Lives in: House/apartment Stairs: steps outdoors Has following equipment at home: None  OCCUPATION: retired  PLOF: Independent  PATIENT GOALS: be able to walk without pain  NEXT MD VISIT: 1 month  OBJECTIVE:  Note: Objective measures were completed at Evaluation unless otherwise noted.  DIAGNOSTIC FINDINGS:  na  PATIENT SURVEYS:  Lower Extremity Functional Score: 35 / 80 = 43.8 % 11/24/23: 25/80 = COGNITION: Overall cognitive status: Within functional limits for tasks assessed     SENSATION: WFL    MUSCLE LENGTH: Hamstrings: Right wfl deg; Left wfl deg Debby test: Right nt deg; Left  nt deg  POSTURE: marked valgum B knees, greater on R  PALPATION: Tender L lateral hip musculature, TFL Tender R glut medius, minimus,  LOWER EXTREMITY ROM:  Passive ROM Right eval Left eval  Hip flexion 105 95  Hip extension    Hip abduction    Hip adduction    Hip internal rotation    Hip external rotation    Knee flexion 112 102  Knee extension -3 -12  Ankle dorsiflexion    Ankle plantarflexion    Ankle inversion    Ankle eversion     (Blank rows = not tested)  LOWER EXTREMITY MMT:  MMT Right eval Left eval Right 11/24/23 Left 11/24/23  Hip flexion 4- 4- 4 4  Hip extension      Hip abduction 3+ 3- 4 4  Hip adduction  Hip internal rotation      Hip external rotation      Knee flexion      Knee extension      Ankle dorsiflexion      Ankle plantarflexion 4- 4-    Ankle inversion      Ankle eversion       (Blank rows = not tested)   FUNCTIONAL TESTS:  Timed up and go (TUG): 18.35 11/24/23:  TUG 15s   GAIT: Distance walked: 34' in clinic, no device, labored gait, slowed cadence decreased stride length B                                                                                                                              TREATMENT DATE:  [facility] Adult PT Treatment:                                             Date: 12/16/2023 Pt seen for aquatic therapy today.  Treatment took place in water 3.5-4.75 ft in depth at the [facility] pool. Temp of water was 91.  Pt entered/exited the pool via stairs independently with bil rail.  - unsupported:  walking/backward x 3 laps - side stepping with arm horiz add/abdct with rainbow hand floats 2 laps -> add/abdct x 2  laps - short squatted rest at wall - suitcase carry with bil rainbow hand floats at side under water then unilaterally, walking forward/ backward - UE on rainbow hand floats 4.3 ft: forward walking kicks x 2 laps; toe/heel raises x 10; hip abdct/ add x 12 each; hip flexion/ extension x 10 each ; return to walking forward/ backward - L stretch x 3 breaths x 3 reps - STS on 3rd step x 5, cues for form  -straddling noodle and UE on corner: cycling; hip abdct/ add   11/21/23 10th visit PN -LEFS -TUG -MMT NuStep L5x47mins  Step ups 6  Resisted gait 30# forwards x4, side steps x3 STS 2x10 HEP   [facility] Adult PT Treatment:                                             Date: 11/19/2023 Pt seen for aquatic therapy today.  Treatment took place in water 3.5-4.75 ft in depth at the [facility] pool. Temp of water was 91.  Pt entered/exited the pool via stairs independently with bil rail.  - unsupported:  walking/backward x 4 laps with reciprocal row motion, - side stepping with arm add/abdct with short hollow noodles x 2 laps - TrA set with short hollow noodle pull down to thighs in staggered stance x 5 each - suitcase carry at 4 ft, with bil short hollow noodles under water ->  single rainbow hand float at side, walking forward/backward - UE on yellow hand floats: toe/heel raises x 15; alternating hamstring curls x 10 each LE; hip abdct/ add 3x5; hip crosses x 10 each - short rest in squatted position - UE on wall: single leg clams x 10 each; (at 52ft6) wide leg, side to side lunge hold for adductor stretch; (at 30ft 8) hip flexion /extension x10; hip circles x 10 (5 CW/5CCW)  - forward walking kick - STS from 3rd step in water, x 5 with cues for neutral head, core engaged, forward arm reach  [facility] Adult PT Treatment:                                             Date: 11/17/2023 Pt seen for aquatic therapy today.  Treatment took place in water 3.5-4.75 ft in depth at the [facility]  pool. Temp of water was 91.  Pt entered/exited the pool via stairs independently with bil rail.  - unsupported:  walking/backward x 3 laps with reciprocal row motion, side stepping with arm add/abdct with short hollow noodles x 3 laps - squatted rest at wall - UE on wall: alternating hamstring curls x 10 each LE; single leg clams x 10 each (improved); toe/heel raises x 20; hip flexion /extension 2x10; hip abdct/ add 2x10; squatted rest; hip crosses x 10 each - UE on yellow hand floats: forward walking kicks  - suitcase carry at 4 ft, with bil short hollow noodles under water -> single rainbow hand float at side, walking forward/backward - STS from 3rd step in water, x 5 with cues for neutral head, core engaged, forward arm reach  [facility] Adult PT Treatment:                                             Date: 11/14/2023 Pt seen for aquatic therapy today.  Treatment took place in water 3.5-4.75 ft in depth at the [facility] pool. Temp of water was 91.  Pt entered/exited the pool via stairs independently with bil rail.  - unsupported:  walking/backward x 3 laps with reciprocal row motion, side stepping 2 laps with horiz abdct/add; side stepping with arm add/abdct with short hollow noodles x 1 lap - UE on wall: alternating hamstring curls x 10 each LE; toe/heel raises x 20;  single leg clams x 10 each (limited ROM); hip crosses x 10 each; hip abdct/ add 2x10 - return to walking with relaxed arms - UE on yellow hand floats: forward walking kicks  - marching with reciprocal row motion with short hollow noodles -> forward walking kicks - TrA set with short hollow noodle pull down to thighs x 3 -> long hollow noodle x 8 (pain in R shoulder) - STS at bench in water with feet on blue step, x 10 with cues for neutral head and core engaged    [facility] Adult PT Treatment:                                             Date: 11/07/2023 Pt seen for aquatic therapy today.  Treatment took place in water  3.5-4.75 ft  in depth at the [facility] pool. Temp of water was 91.  Pt entered/exited the pool via stairs independently with bil rail.  - unsupported:  walking/backward x 3 laps, side stepping 2 laps -side stepping with arm add/abdct with short hollow noodles x 1 lap - marching with reciprocal row motion with short hollow noodles x 2 laps - squatted rest at wall - UE on wall:  toe/heel raises x 15; hip add/abdct x10 ; hip flexion /extension x10;  - return to walking forward/ backward 2 laps  -UE on wall - 10 squats  - TrA set with short hollow noodle pull down to thighs x 10; in staggered stance x 5 each (good balance challenge) - at rails:  L stretch with knees bent x 20sec, 2nd rep with gentle tail wag;  L/R hamstring stretch with foot on 2nd step standing up straight x 20sec each  [facility] Adult PT Treatment:                                             Date: 11/05/2023 Pt seen for aquatic therapy today.  Treatment took place in water 3.5-4.75 ft in depth at the [facility] pool. Temp of water was 91.  Pt entered/exited the pool via stairs independently with bil rail.  - Intro to aquatic therapy principles - UE on barbell:  walking forward x 3 laps, side stepping 2 laps, backward 2 laps - squatted rest at wall - UE on yellow noodle: walking forward/ backward 2 laps, side stepping 1 lap - UE on yellow hand floats: high knee marching backward/ forward and sideways - UE on wall:  toe/heel raises x 10; hip add/abdct x10 ; hip flexion /extension x10;  -squatted rest at wall - TrA set with short hollow noodle pull down to thighs x 10  - yellow noodle under arms with UE on wall-> white noodle under arms with UE on corner: cycling (difficulty maintaining neutral trunk)  10/29/23:  Nustep L 5 x 7 min x 4 exts  Yellow t band B shoulder ER  Yellow t  band rows band under feet B In ll bars with blue t band around thighs, for alt hip abd 15 x In ll bars, blue t band around thighs  for alt SLR, opp hip ext ,rocking forward and back Knee flexion B 35# 3 x 10  Knee ext 10# 3 x 10 Supine for 65 cm ball , B knee to chest, LTR, alt SLR, bridging all 15x Side lying R for deep cross friction massage L lateral hip musculature with theragun   10/27/23:  Knee ext B,10 # 3 x 10 Knee flex B 35# 3 x 10 In ll bars for heel/toe rocks on airex 15 x In ll bars with blue t band around thighs, for alt hip abd 15 x In ll bars, blue t band around thighs for alt SLR, opp hip ext ,rocking forward and back Nustep L 5 x 6 min x 4 exts Side lying for deep cross friction massage L lateral hip over TFL, glut medius with theragun Supine with 65 cm physioball under feet for B knee to chest, LTR, bridging all 10 x  Seated for brief assessment of R shoulder, B shoulder flexion limited, R to 90, L to 110 B shoulder IR/ER wnl MMT B shoulders with pain and weakness R ER, B shoulder flex and  abd   10/08/23:  Eval:  side lying on each side for theragun each hip lateral musculature with improved Sx.  PATIENT EDUCATION:  Education details: exercise rationale, progression and modification  Person educated: Patient  Education method: Explanation, demonstration Education comprehension: verbalized understanding  HOME EXERCISE PROGRAM: Access Code: [email or web address] URL: [email or web address] Date: 11/24/2023 Prepared by: Almetta Fam  Exercises - Step Up  - 1 x daily - 7 x weekly - 2 sets - 10 reps - Lateral Step Up  - 1 x daily - 7 x weekly - 2 sets - 10 reps - Sit to Stand  - 1 x daily - 7 x weekly - 2 sets - 10 reps - Seated Long Arc Quad  - 1 x daily - 7 x weekly - 2 sets - 10 reps  AQUATIC Access Code: [email or web address] URL: [email or web address] Date: 12/16/2023 * not issued yet Prepared by: [facility] [address] This aquatic home exercise program from MedBridge utilizes pictures from land based exercises, but has been adapted prior to lamination and  issuance.    ASSESSMENT:  CLINICAL IMPRESSION:  Pt reported reduction of pain to 1/10 during session.  Balance much improved from initial aquatic therapy session.  Discussed importance of gaining pool access to assist with transition to independent aquatic exercise at d/c.  Aquatic HEP created, but not issued.  Will plan to begin instruction.  Anticipate d/c from aquatics near end of POC. Goals ongoing     Eval: Patient is a 68 y.o. female who was evaluated today for physical therapy due to chronic pain multiple jts. Presents with postural changes remarkably B knee valgum, also weakness B hips, decreased walking tolerance.  Would benefit from PT with combination of manual techniques, therex, modalities to improve sx and work with her [facility] We discussed aquatic therapy due to multiple jt involvement and she is very interested in pursuing this aspect of PT  OBJECTIVE IMPAIRMENTS: cardiopulmonary status limiting activity, decreased activity tolerance, decreased balance, decreased endurance, decreased knowledge of condition, decreased mobility, difficulty walking, decreased ROM, decreased strength, increased fascial restrictions, impaired perceived functional ability, postural dysfunction, and pain.   ACTIVITY LIMITATIONS: carrying, standing, squatting, stairs, transfers, and locomotion level  PARTICIPATION LIMITATIONS: cleaning, laundry, shopping, community activity, and yard work  PERSONAL FACTORS: Behavior pattern, Time since onset of injury/illness/exacerbation, and 1-2 comorbidities: COPD are also affecting patient's functional outcome.   REHAB POTENTIAL: Good  CLINICAL DECISION MAKING: Evolving/moderate complexity  EVALUATION COMPLEXITY: Moderate   GOALS: Goals reviewed with patient? Yes  SHORT TERM GOALS: Target date: 2 weeks, 10/22/23 I HEP Baseline: Goal status: just uses small pedal machine at home 11/24/23   LONG TERM GOALS: Target  date: 01/01/24, 12 weeks  LEFSLower Extremity Functional Score: 35 / 80 = 43.8 % improve to 30%or less disability Baseline:  Goal status: 25/80 going 11/24/23  2.  TUG improve from 18 to 14 or less Baseline:  Goal status: 15s 11/24/23  3.  Strength B hip abd, ext 5/5 Baseline: 3+ and 4-/5 Goal status: ongoing 11/24/23     PLAN:  PT FREQUENCY: 2x/week  PT DURATION: 12 weeks  PLANNED INTERVENTIONS: 97110-Therapeutic exercises, 97530- Therapeutic activity, 97112- Neuromuscular re-education, 97535- Self Care, [id number]- Manual therapy, [phone]- Aquatic Therapy, and Patient/Family education  PLAN FOR NEXT SESSION: progress balance and strengthening  Delon Aquas, PTA 12/16/23 9:23 AM [facility] [address] [place], [place], [address] Phone: [phone]  Fax:  [phone]

## 2023-12-18 ENCOUNTER — Encounter (HOSPITAL_BASED_OUTPATIENT_CLINIC_OR_DEPARTMENT_OTHER): Payer: Self-pay | Admitting: Physical Therapy

## 2023-12-18 ENCOUNTER — Ambulatory Visit (HOSPITAL_BASED_OUTPATIENT_CLINIC_OR_DEPARTMENT_OTHER): Admitting: Physical Therapy

## 2023-12-18 DIAGNOSIS — R262 Difficulty in walking, not elsewhere classified: Secondary | ICD-10-CM

## 2023-12-18 DIAGNOSIS — G8929 Other chronic pain: Secondary | ICD-10-CM

## 2023-12-18 DIAGNOSIS — R293 Abnormal posture: Secondary | ICD-10-CM

## 2023-12-18 DIAGNOSIS — M5459 Other low back pain: Secondary | ICD-10-CM

## 2023-12-18 NOTE — Therapy (Signed)
 OUTPATIENT PHYSICAL THERAPY LOWER EXTREMITY TREATMENT    Patient Name: Rose Miller MRN: [id number] DOB:May 01, 1955, 68 y.o., female Today's Date: 12/18/2023  END OF SESSION:  PT End of Session - 12/18/23 1457     Visit Number 15    Date for Recertification  01/01/24    Progress Note Due on Visit 20    PT Start Time 1445    PT Stop Time 1525    PT Time Calculation (min) 40 min    Activity Tolerance Patient tolerated treatment well    Behavior During Therapy [facility] for tasks assessed/performed              Past Medical History:  Diagnosis Date   Adhesive capsulitis    Aortic valve disease    Arthritis    Asthma    Back pain    CKD (chronic kidney disease) stage 3, GFR 30-59 ml/min (HCC)    Constipation    COPD (chronic obstructive pulmonary disease) (HCC)    Diabetes mellitus without complication (HCC) 10 20 2020   Diuretic-induced hypokalemia    Heart murmur    Hypercholesterolemia    Hypertension    IT band syndrome    Morbid obesity (HCC)    Palpitations    Periumbilical pain    Postmenopausal estrogen deficiency    Prediabetes    S/P TAVR (transcatheter aortic valve replacement) 12/26/2020   Ventral hernia    Past Surgical History:  Procedure Laterality Date   BIOPSY  05/01/2021   Procedure: BIOPSY;  Surgeon: Charlanne Groom, MD;  Location: [facility];  Service: Gastroenterology;;   CARDIAC CATHETERIZATION  11/24/2020   CESAREAN SECTION  1989   COLONOSCOPY     COLONOSCOPY WITH PROPOFOL  N/A 05/01/2021   Procedure: COLONOSCOPY WITH PROPOFOL ;  Surgeon: Charlanne Groom, MD;  Location: [facility];  Service: Gastroenterology;  Laterality: N/A;   ESOPHAGOGASTRODUODENOSCOPY (EGD) WITH PROPOFOL  N/A 05/01/2021   Procedure: ESOPHAGOGASTRODUODENOSCOPY (EGD) WITH PROPOFOL ;  Surgeon: Charlanne Groom, MD;  Location: [facility];  Service: Gastroenterology;  Laterality: N/A;   INTRAOPERATIVE TRANSTHORACIC ECHOCARDIOGRAM N/A 12/26/2020   Procedure: INTRAOPERATIVE  TRANSTHORACIC ECHOCARDIOGRAM;  Surgeon: Wendel Lurena POUR, MD;  Location: [facility];  Service: Open Heart Surgery;  Laterality: N/A;   MALONEY DILATION  05/01/2021   Procedure: AGAPITO DILATION;  Surgeon: Charlanne Groom, MD;  Location: [facility];  Service: Gastroenterology;;   POLYPECTOMY  05/01/2021   Procedure: POLYPECTOMY;  Surgeon: Charlanne Groom, MD;  Location: [facility];  Service: Gastroenterology;;   RIGHT/LEFT HEART CATH AND CORONARY ANGIOGRAPHY N/A 11/24/2020   Procedure: RIGHT/LEFT HEART CATH AND CORONARY ANGIOGRAPHY;  Surgeon: Claudene Victory ORN, MD;  Location: [facility] INVASIVE CV LAB;  Service: Cardiovascular;  Laterality: N/A;   TONSILLECTOMY AND ADENOIDECTOMY  1960   TRANSCATHETER AORTIC VALVE REPLACEMENT, TRANSFEMORAL N/A 12/26/2020   Procedure: TRANSCATHETER AORTIC VALVE REPLACEMENT, TRANSFEMORAL;  Surgeon: Wendel Lurena POUR, MD;  Location: [facility];  Service: Open Heart Surgery;  Laterality: N/A;   Patient Active Problem List   Diagnosis Date Noted   Acquired hammer toe of right foot 09/26/2023   Atherosclerosis of artery of both lower extremities 09/26/2023   Polyneuropathy due to type 2 diabetes mellitus (HCC) 09/26/2023   Primary localized osteoarthrosis of ankle and foot 09/26/2023   Kidney lesion 04/14/2023   Abscess of lower back 03/17/2023   Ingrown nail of great toe of left foot 12/16/2022   Vitamin D  deficiency 12/16/2022   Low back pain 12/16/2022   Chronic pain of both knees  12/16/2022   Dyspnea on exertion 12/16/2022   Coronary artery disease due to calcified coronary lesion 12/16/2022   Aortic atherosclerosis 12/16/2022   PAF (paroxysmal atrial fibrillation) (HCC) 03/14/2022   Otalgia of left ear 03/14/2022   Diplopia 09/12/2021   Allergic rhinitis 09/12/2021   Pharyngoesophageal dysphagia    Family history of colon cancer in father    Aortic valve disease 12/26/2020   COPD (chronic obstructive pulmonary disease) (HCC) 12/26/2020    Postmenopausal estrogen deficiency 02/18/2019   Former smoker 05/13/2018   Long-term use of aspirin  therapy 05/13/2018   Peripheral vascular disease 05/05/2018   Prediabetes 05/05/2018   Type 2 diabetes mellitus in patient with obesity (HCC) 03/31/2017   CKD stage 3 secondary to diabetes (HCC) 03/31/2017   Hypercholesterolemia 06/28/2016   Aortic valve stenosis 03/29/2016   Periumbilical pain 03/29/2016   IT band syndrome 09/11/2014   Right shoulder pain 09/11/2014   Ventral hernia 05/01/2014   Morbid obesity (HCC) 03/17/2014    PCP: Sebastian Righter MD  REFERRING PROVIDER: same  REFERRING DIAG: lower back, B knee, L hip pain  THERAPY DIAG:  Difficulty in walking, not elsewhere classified  Other low back pain  Chronic pain of both knees  Abnormal posture  Rationale for Evaluation and Treatment: Rehabilitation  ONSET DATE: December 2024  SUBJECTIVE:   SUBJECTIVE STATEMENT: I really love this.  I have to be consistent with this. Pt reports she has not had opportunity to check out [facility] but thinks it may be just as close to her as [employer].    POOL ACCESS:  Pt is member of [employer].    PERTINENT HISTORY: Referred by PCP due to pain complaints knees, hips lower back PAIN:  Are you having pain? Yes: NPRS scale: 2/10 Pain location: lateral hips, B knees, Rt shoulder  Pain description: deep pain with walking Aggravating factors: walking Relieving factors: resting  PRECAUTIONS: Fall  RED FLAGS: None   WEIGHT BEARING RESTRICTIONS: No  FALLS:  Has patient fallen in last 6 months? No  LIVING ENVIRONMENT: Lives with: lives with their son Lives in: House/apartment Stairs: steps outdoors Has following equipment at home: None  OCCUPATION: retired  PLOF: Independent  PATIENT GOALS: be able to walk without pain  NEXT MD VISIT: 1 month  OBJECTIVE:  Note: Objective measures were completed at Evaluation unless otherwise noted.  DIAGNOSTIC FINDINGS:  na  PATIENT SURVEYS:  Lower Extremity Functional Score: 35 / 80 = 43.8 % 11/24/23: 25/80 = COGNITION: Overall cognitive status: Within functional limits for tasks assessed     SENSATION: WFL    MUSCLE LENGTH: Hamstrings: Right wfl deg; Left wfl deg Debby test: Right nt deg; Left  nt deg  POSTURE: marked valgum B knees, greater on R  PALPATION: Tender L lateral hip musculature, TFL Tender R glut medius, minimus,  LOWER EXTREMITY ROM:  Passive ROM Right eval Left eval Left  12/18/23 PROM  Hip flexion 105 95   Hip extension     Hip abduction     Hip adduction     Hip internal rotation     Hip external rotation     Knee flexion 112 102 107  Knee extension -3 -12  Lacking 3  Ankle dorsiflexion     Ankle plantarflexion     Ankle inversion     Ankle eversion      (Blank rows = not tested)  LOWER EXTREMITY MMT:  MMT Right eval Left eval Right 11/24/23 Left 11/24/23  Hip flexion 4-  4- 4 4  Hip extension      Hip abduction 3+ 3- 4 4  Hip adduction      Hip internal rotation      Hip external rotation      Knee flexion      Knee extension      Ankle dorsiflexion      Ankle plantarflexion 4- 4-    Ankle inversion      Ankle eversion       (Blank rows = not tested)   FUNCTIONAL TESTS:  Timed up and go (TUG): 18.35 11/24/23:  TUG 15s   GAIT: Distance walked: 34' in clinic, no device, labored gait, slowed cadence decreased stride length B                                                                                                                              TREATMENT DATE:  [facility] Adult PT Treatment:                                             Date: 12/18/2023 Pt seen for aquatic therapy today.  Treatment took place in water 3.5-4.75 ft in depth at the [facility] pool. Temp of water was 91.  Pt entered/exited the pool via stairs independently with bil rail.  - unsupported:  walking/backward x 3 laps - side stepping with arm horiz add/abdct  with rainbow hand floats 2 laps -> add/abdct x 2 laps - short squatted rest at wall - suitcase carry with bil rainbow hand floats at side under water then unilaterally, walking forward/ backward - UE on rainbow hand floats 4.3 ft: forward walking kicks x 2 laps; toe/heel raises x 10; hip abdct/ add x 12 each; hip flexion/ extension x 10 each ; return to walking forward/ backward - L stretch x 3 breaths x 3 reps - wall push up/off x 10 - forward walking kick - STS from 3rd step in water, x 5 with cues for neutral head, core engaged, forward arm reach -straddling noodle and UE on corner: cycling; hip abdct/ add; cycling  [facility] Adult PT Treatment:                                             Date: 12/16/2023 Pt seen for aquatic therapy today.  Treatment took place in water 3.5-4.75 ft in depth at the [facility] pool. Temp of water was 91.  Pt entered/exited the pool via stairs independently with bil rail.  - unsupported:  walking/backward x 3 laps - side stepping with arm horiz add/abdct with rainbow hand floats 2 laps -> add/abdct x 2 laps - short squatted rest at wall - suitcase carry with bil/ single rainbow hand  floats at side under water then unilaterally, walking forward/ backward - squats without UE support -> pushing single rainbow hand float under water - UE on rainbow hand floats 4.3 ft: forward walking kicks x 2 laps; toe/heel raises x 10; hip abdct/ add x 12 each; hip flexion/ extension x 10 each ; return to walking forward/ backward - L stretch x 3 breaths x 3 reps - STS on 3rd step x 5, cues for form  -straddling noodle and UE on corner: cycling; hip abdct/ add   11/21/23 10th visit PN -LEFS -TUG -MMT NuStep L5x83mins  Step ups 6  Resisted gait 30# forwards x4, side steps x3 STS 2x10 HEP   [facility] Adult PT Treatment:                                             Date: 11/19/2023 Pt seen for aquatic therapy today.  Treatment took place in water 3.5-4.75 ft in depth at  the [facility] pool. Temp of water was 91.  Pt entered/exited the pool via stairs independently with bil rail.  - unsupported:  walking/backward x 4 laps with reciprocal row motion, - side stepping with arm add/abdct with short hollow noodles x 2 laps - TrA set with short hollow noodle pull down to thighs in staggered stance x 5 each - suitcase carry at 4 ft, with bil short hollow noodles under water -> single rainbow hand float at side, walking forward/backward - UE on yellow hand floats: toe/heel raises x 15; alternating hamstring curls x 10 each LE; hip abdct/ add 3x5; hip crosses x 10 each - short rest in squatted position - UE on wall: single leg clams x 10 each; (at 14ft6) wide leg, side to side lunge hold for adductor stretch; (at 48ft 8) hip flexion /extension x10; hip circles x 10 (5 CW/5CCW)  - forward walking kick - STS from 3rd step in water, x 5 with cues for neutral head, core engaged, forward arm reach  [facility] Adult PT Treatment:                                             Date: 11/17/2023 Pt seen for aquatic therapy today.  Treatment took place in water 3.5-4.75 ft in depth at the [facility] pool. Temp of water was 91.  Pt entered/exited the pool via stairs independently with bil rail.  - unsupported:  walking/backward x 3 laps with reciprocal row motion, side stepping with arm add/abdct with short hollow noodles x 3 laps - squatted rest at wall - UE on wall: alternating hamstring curls x 10 each LE; single leg clams x 10 each (improved); toe/heel raises x 20; hip flexion /extension 2x10; hip abdct/ add 2x10; squatted rest; hip crosses x 10 each - UE on yellow hand floats: forward walking kicks  - suitcase carry at 4 ft, with bil short hollow noodles under water -> single rainbow hand float at side, walking forward/backward - STS from 3rd step in water, x 5 with cues for neutral head, core engaged, forward arm reach  [facility] Adult PT Treatment:  Date: 11/14/2023 Pt seen for aquatic therapy today.  Treatment took place in water 3.5-4.75 ft in depth at the [facility] pool. Temp of water was 91.  Pt entered/exited the pool via stairs independently with bil rail.  - unsupported:  walking/backward x 3 laps with reciprocal row motion, side stepping 2 laps with horiz abdct/add; side stepping with arm add/abdct with short hollow noodles x 1 lap - UE on wall: alternating hamstring curls x 10 each LE; toe/heel raises x 20;  single leg clams x 10 each (limited ROM); hip crosses x 10 each; hip abdct/ add 2x10 - return to walking with relaxed arms - UE on yellow hand floats: forward walking kicks  - marching with reciprocal row motion with short hollow noodles -> forward walking kicks - TrA set with short hollow noodle pull down to thighs x 3 -> long hollow noodle x 8 (pain in R shoulder) - STS at bench in water with feet on blue step, x 10 with cues for neutral head and core engaged    [facility] Adult PT Treatment:                                             Date: 11/07/2023 Pt seen for aquatic therapy today.  Treatment took place in water 3.5-4.75 ft in depth at the [facility] pool. Temp of water was 91.  Pt entered/exited the pool via stairs independently with bil rail.  - unsupported:  walking/backward x 3 laps, side stepping 2 laps -side stepping with arm add/abdct with short hollow noodles x 1 lap - marching with reciprocal row motion with short hollow noodles x 2 laps - squatted rest at wall - UE on wall:  toe/heel raises x 15; hip add/abdct x10 ; hip flexion /extension x10;  - return to walking forward/ backward 2 laps  -UE on wall - 10 squats  - TrA set with short hollow noodle pull down to thighs x 10; in staggered stance x 5 each (good balance challenge) - at rails:  L stretch with knees bent x 20sec, 2nd rep with gentle tail wag;  L/R hamstring stretch with foot on 2nd step standing up  straight x 20sec each  [facility] Adult PT Treatment:                                             Date: 11/05/2023 Pt seen for aquatic therapy today.  Treatment took place in water 3.5-4.75 ft in depth at the [facility] pool. Temp of water was 91.  Pt entered/exited the pool via stairs independently with bil rail.  - Intro to aquatic therapy principles - UE on barbell:  walking forward x 3 laps, side stepping 2 laps, backward 2 laps - squatted rest at wall - UE on yellow noodle: walking forward/ backward 2 laps, side stepping 1 lap - UE on yellow hand floats: high knee marching backward/ forward and sideways - UE on wall:  toe/heel raises x 10; hip add/abdct x10 ; hip flexion /extension x10;  -squatted rest at wall - TrA set with short hollow noodle pull down to thighs x 10  - yellow noodle under arms with UE on wall-> white noodle under arms with UE on corner: cycling (difficulty maintaining neutral  trunk)  10/29/23:  Nustep L 5 x 7 min x 4 exts  Yellow t band B shoulder ER  Yellow t  band rows band under feet B In ll bars with blue t band around thighs, for alt hip abd 15 x In ll bars, blue t band around thighs for alt SLR, opp hip ext ,rocking forward and back Knee flexion B 35# 3 x 10  Knee ext 10# 3 x 10 Supine for 65 cm ball , B knee to chest, LTR, alt SLR, bridging all 15x Side lying R for deep cross friction massage L lateral hip musculature with theragun   10/27/23:  Knee ext B,10 # 3 x 10 Knee flex B 35# 3 x 10 In ll bars for heel/toe rocks on airex 15 x In ll bars with blue t band around thighs, for alt hip abd 15 x In ll bars, blue t band around thighs for alt SLR, opp hip ext ,rocking forward and back Nustep L 5 x 6 min x 4 exts Side lying for deep cross friction massage L lateral hip over TFL, glut medius with theragun Supine with 65 cm physioball under feet for B knee to chest, LTR, bridging all 10 x  Seated for brief assessment of R shoulder, B shoulder  flexion limited, R to 90, L to 110 B shoulder IR/ER wnl MMT B shoulders with pain and weakness R ER, B shoulder flex and abd   10/08/23:  Eval:  side lying on each side for theragun each hip lateral musculature with improved Sx.  PATIENT EDUCATION:  Education details: exercise rationale, progression and modification  Person educated: Patient  Education method: Explanation, demonstration Education comprehension: verbalized understanding  HOME EXERCISE PROGRAM: Access Code: [email or web address] URL: [email or web address] Date: 11/24/2023 Prepared by: Almetta Fam  Exercises - Step Up  - 1 x daily - 7 x weekly - 2 sets - 10 reps - Lateral Step Up  - 1 x daily - 7 x weekly - 2 sets - 10 reps - Sit to Stand  - 1 x daily - 7 x weekly - 2 sets - 10 reps - Seated Long Arc Quad  - 1 x daily - 7 x weekly - 2 sets - 10 reps  AQUATIC Access Code: [email or web address] URL: [email or web address] Date: 12/16/2023 * not issued yet Prepared by: [facility] [address] This aquatic home exercise program from [employer] utilizes pictures from land based exercises, but has been adapted prior to lamination and issuance.    ASSESSMENT:  CLINICAL IMPRESSION:  Improved Lt knee ROM.  Pt reported reduction of overall pain to 1/10 during session. Good toleration of new progressive exercises today (resisted squats and wall push up/off) Aquatic HEP created, but not issued.  Anticipate d/c from aquatics near end of POC. Goals ongoing     Eval: Patient is a 68 y.o. female who was evaluated today for physical therapy due to chronic pain multiple jts. Presents with postural changes remarkably B knee valgum, also weakness B hips, decreased walking tolerance.  Would benefit from PT with combination of manual techniques, therex, modalities to improve sx and work with her current [facility] routine [facility] We discussed aquatic therapy due to multiple jt involvement and she is  very interested in pursuing this aspect of PT  OBJECTIVE IMPAIRMENTS: cardiopulmonary status limiting activity, decreased activity tolerance, decreased balance, decreased endurance, decreased knowledge of condition, decreased mobility, difficulty walking, decreased ROM, decreased strength, increased fascial restrictions, impaired  perceived functional ability, postural dysfunction, and pain.   ACTIVITY LIMITATIONS: carrying, standing, squatting, stairs, transfers, and locomotion level  PARTICIPATION LIMITATIONS: cleaning, laundry, shopping, community activity, and yard work  PERSONAL FACTORS: Behavior pattern, Time since onset of injury/illness/exacerbation, and 1-2 comorbidities: COPD are also affecting patient's functional outcome.   REHAB POTENTIAL: Good  CLINICAL DECISION MAKING: Evolving/moderate complexity  EVALUATION COMPLEXITY: Moderate   GOALS: Goals reviewed with patient? Yes  SHORT TERM GOALS: Target date: 2 weeks, 10/22/23 I HEP Baseline: Goal status: just uses small pedal machine at home 11/24/23   LONG TERM GOALS: Target date: 01/01/24, 12 weeks  LEFSLower Extremity Functional Score: 35 / 80 = 43.8 % improve to 30%or less disability Baseline:  Goal status: 25/80 going 11/24/23  2.  TUG improve from 18 to 14 or less Baseline:  Goal status: 15s 11/24/23  3.  Strength B hip abd, ext 5/5 Baseline: 3+ and 4-/5 Goal status: ongoing 11/24/23     PLAN:  PT FREQUENCY: 2x/week  PT DURATION: 12 weeks  PLANNED INTERVENTIONS: 97110-Therapeutic exercises, 97530- Therapeutic activity, 97112- Neuromuscular re-education, 97535- Self Care, [id number]- Manual therapy, [id number]- Aquatic Therapy, and Patient/Family education  PLAN FOR NEXT SESSION: progress balance and strengthening  Delon Aquas, PTA 12/18/23 3:29 PM [facility] [address] [place], [place], [address] Phone: [phone]   Fax:  [phone]

## 2023-12-19 NOTE — Telephone Encounter (Signed)
 Stop Losartan , start olmesartan 40mg  daily. BMP and report back BP in one week.   Hari Casaus S Deo Mehringer, NP

## 2023-12-21 ENCOUNTER — Other Ambulatory Visit (HOSPITAL_COMMUNITY): Payer: Self-pay

## 2023-12-22 ENCOUNTER — Other Ambulatory Visit: Payer: Self-pay

## 2023-12-22 ENCOUNTER — Ambulatory Visit (HOSPITAL_BASED_OUTPATIENT_CLINIC_OR_DEPARTMENT_OTHER): Admitting: Physical Therapy

## 2023-12-22 ENCOUNTER — Encounter (HOSPITAL_BASED_OUTPATIENT_CLINIC_OR_DEPARTMENT_OTHER): Payer: Self-pay | Admitting: Physical Therapy

## 2023-12-22 ENCOUNTER — Other Ambulatory Visit (HOSPITAL_COMMUNITY): Payer: Self-pay

## 2023-12-22 DIAGNOSIS — R262 Difficulty in walking, not elsewhere classified: Secondary | ICD-10-CM | POA: Diagnosis not present

## 2023-12-22 DIAGNOSIS — G8929 Other chronic pain: Secondary | ICD-10-CM

## 2023-12-22 DIAGNOSIS — M5459 Other low back pain: Secondary | ICD-10-CM

## 2023-12-22 MED ORDER — FLUTICASONE PROPIONATE 50 MCG/ACT NA SUSP
2.0000 | Freq: Every day | NASAL | 6 refills | Status: AC
  Filled 2023-12-22 – 2023-12-23 (×2): qty 16, 30d supply, fill #0
  Filled 2024-01-18: qty 16, 30d supply, fill #1
  Filled 2024-02-17: qty 16, 30d supply, fill #2

## 2023-12-22 NOTE — Therapy (Signed)
 OUTPATIENT PHYSICAL THERAPY LOWER EXTREMITY TREATMENT    Patient Name: Rose Miller MRN: [id number] DOB:1955/02/02, 68 y.o., female Today's Date: 12/22/2023  END OF SESSION:  PT End of Session - 12/22/23 1105     Visit Number 16    Date for Recertification  01/01/24    Progress Note Due on Visit 20    PT Start Time 1103    PT Stop Time 1141    PT Time Calculation (min) 38 min    Activity Tolerance Patient tolerated treatment well    Behavior During Therapy [facility] for tasks assessed/performed              Past Medical History:  Diagnosis Date   Adhesive capsulitis    Aortic valve disease    Arthritis    Asthma    Back pain    CKD (chronic kidney disease) stage 3, GFR 30-59 ml/min (HCC)    Constipation    COPD (chronic obstructive pulmonary disease) (HCC)    Diabetes mellitus without complication (HCC) 10 20 2020   Diuretic-induced hypokalemia    Heart murmur    Hypercholesterolemia    Hypertension    IT band syndrome    Morbid obesity (HCC)    Palpitations    Periumbilical pain    Postmenopausal estrogen deficiency    Prediabetes    S/P TAVR (transcatheter aortic valve replacement) 12/26/2020   Ventral hernia    Past Surgical History:  Procedure Laterality Date   BIOPSY  05/01/2021   Procedure: BIOPSY;  Surgeon: Charlanne Groom, MD;  Location: [facility];  Service: Gastroenterology;;   CARDIAC CATHETERIZATION  11/24/2020   CESAREAN SECTION  1989   COLONOSCOPY     COLONOSCOPY WITH PROPOFOL  N/A 05/01/2021   Procedure: COLONOSCOPY WITH PROPOFOL ;  Surgeon: Charlanne Groom, MD;  Location: [facility];  Service: Gastroenterology;  Laterality: N/A;   ESOPHAGOGASTRODUODENOSCOPY (EGD) WITH PROPOFOL  N/A 05/01/2021   Procedure: ESOPHAGOGASTRODUODENOSCOPY (EGD) WITH PROPOFOL ;  Surgeon: Charlanne Groom, MD;  Location: [facility];  Service: Gastroenterology;  Laterality: N/A;   INTRAOPERATIVE TRANSTHORACIC ECHOCARDIOGRAM N/A 12/26/2020   Procedure: INTRAOPERATIVE  TRANSTHORACIC ECHOCARDIOGRAM;  Surgeon: Wendel Lurena POUR, MD;  Location: [facility];  Service: Open Heart Surgery;  Laterality: N/A;   MALONEY DILATION  05/01/2021   Procedure: AGAPITO DILATION;  Surgeon: Charlanne Groom, MD;  Location: [facility];  Service: Gastroenterology;;   POLYPECTOMY  05/01/2021   Procedure: POLYPECTOMY;  Surgeon: Charlanne Groom, MD;  Location: [facility];  Service: Gastroenterology;;   RIGHT/LEFT HEART CATH AND CORONARY ANGIOGRAPHY N/A 11/24/2020   Procedure: RIGHT/LEFT HEART CATH AND CORONARY ANGIOGRAPHY;  Surgeon: Claudene Victory ORN, MD;  Location: [facility]IVE CV LAB;  Service: Cardiovascular;  Laterality: N/A;   TONSILLECTOMY AND ADENOIDECTOMY  1960   TRANSCATHETER AORTIC VALVE REPLACEMENT, TRANSFEMORAL N/A 12/26/2020   Procedure: TRANSCATHETER AORTIC VALVE REPLACEMENT, TRANSFEMORAL;  Surgeon: Wendel Lurena POUR, MD;  Location: [facility];  Service: Open Heart Surgery;  Laterality: N/A;   Patient Active Problem List   Diagnosis Date Noted   Acquired hammer toe of right foot 09/26/2023   Atherosclerosis of artery of both lower extremities 09/26/2023   Polyneuropathy due to type 2 diabetes mellitus (HCC) 09/26/2023   Primary localized osteoarthrosis of ankle and foot 09/26/2023   Kidney lesion 04/14/2023   Abscess of lower back 03/17/2023   Ingrown nail of great toe of left foot 12/16/2022   Vitamin D  deficiency 12/16/2022   Low back pain 12/16/2022   Chronic pain of both knees  12/16/2022   Dyspnea on exertion 12/16/2022   Coronary artery disease due to calcified coronary lesion 12/16/2022   Aortic atherosclerosis 12/16/2022   PAF (paroxysmal atrial fibrillation) (HCC) 03/14/2022   Otalgia of left ear 03/14/2022   Diplopia 09/12/2021   Allergic rhinitis 09/12/2021   Pharyngoesophageal dysphagia    Family history of colon cancer in father    Aortic valve disease 12/26/2020   COPD (chronic obstructive pulmonary disease) (HCC) 12/26/2020    Postmenopausal estrogen deficiency 02/18/2019   Former smoker 05/13/2018   Long-term use of aspirin  therapy 05/13/2018   Peripheral vascular disease 05/05/2018   Prediabetes 05/05/2018   Type 2 diabetes mellitus in patient with obesity (HCC) 03/31/2017   CKD stage 3 secondary to diabetes (HCC) 03/31/2017   Hypercholesterolemia 06/28/2016   Aortic valve stenosis 03/29/2016   Periumbilical pain 03/29/2016   IT band syndrome 09/11/2014   Right shoulder pain 09/11/2014   Ventral hernia 05/01/2014   Morbid obesity (HCC) 03/17/2014    PCP: Sebastian Righter MD  REFERRING PROVIDER: same  REFERRING DIAG: lower back, B knee, L hip pain  THERAPY DIAG:  Difficulty in walking, not elsewhere classified  Other low back pain  Chronic pain of both knees  Rationale for Evaluation and Treatment: Rehabilitation  ONSET DATE: December 2024  SUBJECTIVE:   SUBJECTIVE STATEMENT: I love the therapy  No reports of pain just stiffness  POOL ACCESS:  Pt is member of [employer].    PERTINENT HISTORY: Referred by PCP due to pain complaints knees, hips lower back PAIN:  Are you having pain? Yes: NPRS scale: 2/10 Pain location: lateral hips, B knees, Rt shoulder  Pain description: deep pain with walking Aggravating factors: walking Relieving factors: resting  PRECAUTIONS: Fall  RED FLAGS: None   WEIGHT BEARING RESTRICTIONS: No  FALLS:  Has patient fallen in last 6 months? No  LIVING ENVIRONMENT: Lives with: lives with their son Lives in: House/apartment Stairs: steps outdoors Has following equipment at home: None  OCCUPATION: retired  PLOF: Independent  PATIENT GOALS: be able to walk without pain  NEXT MD VISIT: 1 month  OBJECTIVE:  Note: Objective measures were completed at Evaluation unless otherwise noted.  DIAGNOSTIC FINDINGS: na  PATIENT SURVEYS:  Lower Extremity Functional Score: 35 / 80 = 43.8 % 11/24/23: 25/80 = COGNITION: Overall cognitive status: Within  functional limits for tasks assessed     SENSATION: WFL    MUSCLE LENGTH: Hamstrings: Right wfl deg; Left wfl deg Debby test: Right nt deg; Left  nt deg  POSTURE: marked valgum B knees, greater on R  PALPATION: Tender L lateral hip musculature, TFL Tender R glut medius, minimus,  LOWER EXTREMITY ROM:  Passive ROM Right eval Left eval Left  12/18/23 PROM  Hip flexion 105 95   Hip extension     Hip abduction     Hip adduction     Hip internal rotation     Hip external rotation     Knee flexion 112 102 107  Knee extension -3 -12  Lacking 3  Ankle dorsiflexion     Ankle plantarflexion     Ankle inversion     Ankle eversion      (Blank rows = not tested)  LOWER EXTREMITY MMT:  MMT Right eval Left eval Right 11/24/23 Left 11/24/23  Hip flexion 4- 4- 4 4  Hip extension      Hip abduction 3+ 3- 4 4  Hip adduction      Hip internal rotation  Hip external rotation      Knee flexion      Knee extension      Ankle dorsiflexion      Ankle plantarflexion 4- 4-    Ankle inversion      Ankle eversion       (Blank rows = not tested)   FUNCTIONAL TESTS:  Timed up and go (TUG): 18.35 11/24/23:  TUG 15s   GAIT: Distance walked: 82' in clinic, no device, labored gait, slowed cadence decreased stride length B                                                                                                                              TREATMENT DATE:  [facility] Adult PT Treatment:                                             Date: 12/22/2023 Pt seen for aquatic therapy today.  Treatment took place in water 3.5-4.75 ft in depth at the [facility] pool. Temp of water was 91.  Pt entered/exited the pool via stairs independently with bil rail.   Exercises - walking forward/ backward with gentle arm swing  - Side Stepping with or without Hand floats    - Standing Fold Over Stretch at [place] [employer] carry with single hand float at side then on both sides,  walking forward/ backward  - Leg Swings Side to Side - hold wall or hand floats    - Forward Backward Leg Swing - hold wall or hand floats  - Heel and toe raises , holding wall in pool or hand floats   - Marching Forwards and Backwards with Row Motion with Hand Floats   - Seated straddle on noodle, holding corner, bicycle legs   [facility] Adult PT Treatment:                                             Date: 12/16/2023 Pt seen for aquatic therapy today.  Treatment took place in water 3.5-4.75 ft in depth at the [facility] pool. Temp of water was 91.  Pt entered/exited the pool via stairs independently with bil rail.  - unsupported:  walking/backward x 3 laps - side stepping with arm horiz add/abdct with rainbow hand floats 2 laps -> add/abdct x 2 laps - short squatted rest at wall - suitcase carry with bil/ single rainbow hand floats at side under water then unilaterally, walking forward/ backward - squats without UE support -> pushing single rainbow hand float under water - UE on rainbow hand floats 4.3 ft: forward walking kicks x 2 laps; toe/heel raises x 10; hip abdct/ add x 12 each; hip flexion/ extension x 10 each ;  return to walking forward/ backward - L stretch x 3 breaths x 3 reps - STS on 3rd step x 5, cues for form  -straddling noodle and UE on corner: cycling; hip abdct/ add   11/21/23 10th visit PN -LEFS -TUG -MMT NuStep L5x42mins  Step ups 6  Resisted gait 30# forwards x4, side steps x3 STS 2x10 HEP   [facility] Adult PT Treatment:                                             Date: 11/19/2023 Pt seen for aquatic therapy today.  Treatment took place in water 3.5-4.75 ft in depth at the [facility] pool. Temp of water was 91.  Pt entered/exited the pool via stairs independently with bil rail.  - unsupported:  walking/backward x 4 laps with reciprocal row motion, - side stepping with arm add/abdct with short hollow noodles x 2 laps - TrA set with short hollow  noodle pull down to thighs in staggered stance x 5 each - suitcase carry at 4 ft, with bil short hollow noodles under water -> single rainbow hand float at side, walking forward/backward - UE on yellow hand floats: toe/heel raises x 15; alternating hamstring curls x 10 each LE; hip abdct/ add 3x5; hip crosses x 10 each - short rest in squatted position - UE on wall: single leg clams x 10 each; (at 66ft6) wide leg, side to side lunge hold for adductor stretch; (at 26ft 8) hip flexion /extension x10; hip circles x 10 (5 CW/5CCW)  - forward walking kick - STS from 3rd step in water, x 5 with cues for neutral head, core engaged, forward arm reach  [facility] Adult PT Treatment:                                             Date: 11/17/2023 Pt seen for aquatic therapy today.  Treatment took place in water 3.5-4.75 ft in depth at the [facility] pool. Temp of water was 91.  Pt entered/exited the pool via stairs independently with bil rail.  - unsupported:  walking/backward x 3 laps with reciprocal row motion, side stepping with arm add/abdct with short hollow noodles x 3 laps - squatted rest at wall - UE on wall: alternating hamstring curls x 10 each LE; single leg clams x 10 each (improved); toe/heel raises x 20; hip flexion /extension 2x10; hip abdct/ add 2x10; squatted rest; hip crosses x 10 each - UE on yellow hand floats: forward walking kicks  - suitcase carry at 4 ft, with bil short hollow noodles under water -> single rainbow hand float at side, walking forward/backward - STS from 3rd step in water, x 5 with cues for neutral head, core engaged, forward arm reach  [facility] Adult PT Treatment:                                             Date: 11/14/2023 Pt seen for aquatic therapy today.  Treatment took place in water 3.5-4.75 ft in depth at the [facility] pool. Temp of water was 91.  Pt entered/exited the pool via stairs independently with bil rail.  -  unsupported:  walking/backward x 3  laps with reciprocal row motion, side stepping 2 laps with horiz abdct/add; side stepping with arm add/abdct with short hollow noodles x 1 lap - UE on wall: alternating hamstring curls x 10 each LE; toe/heel raises x 20;  single leg clams x 10 each (limited ROM); hip crosses x 10 each; hip abdct/ add 2x10 - return to walking with relaxed arms - UE on yellow hand floats: forward walking kicks  - marching with reciprocal row motion with short hollow noodles -> forward walking kicks - TrA set with short hollow noodle pull down to thighs x 3 -> long hollow noodle x 8 (pain in R shoulder) - STS at bench in water with feet on blue step, x 10 with cues for neutral head and core engaged    [facility] Adult PT Treatment:                                             Date: 11/07/2023 Pt seen for aquatic therapy today.  Treatment took place in water 3.5-4.75 ft in depth at the [facility] pool. Temp of water was 91.  Pt entered/exited the pool via stairs independently with bil rail.  - unsupported:  walking/backward x 3 laps, side stepping 2 laps -side stepping with arm add/abdct with short hollow noodles x 1 lap - marching with reciprocal row motion with short hollow noodles x 2 laps - squatted rest at wall - UE on wall:  toe/heel raises x 15; hip add/abdct x10 ; hip flexion /extension x10;  - return to walking forward/ backward 2 laps  -UE on wall - 10 squats  - TrA set with short hollow noodle pull down to thighs x 10; in staggered stance x 5 each (good balance challenge) - at rails:  L stretch with knees bent x 20sec, 2nd rep with gentle tail wag;  L/R hamstring stretch with foot on 2nd step standing up straight x 20sec each  [facility] Adult PT Treatment:                                             Date: 11/05/2023 Pt seen for aquatic therapy today.  Treatment took place in water 3.5-4.75 ft in depth at the [facility] pool. Temp of water was 91.  Pt entered/exited the pool via stairs  independently with bil rail.  - Intro to aquatic therapy principles - UE on barbell:  walking forward x 3 laps, side stepping 2 laps, backward 2 laps - squatted rest at wall - UE on yellow noodle: walking forward/ backward 2 laps, side stepping 1 lap - UE on yellow hand floats: high knee marching backward/ forward and sideways - UE on wall:  toe/heel raises x 10; hip add/abdct x10 ; hip flexion /extension x10;  -squatted rest at wall - TrA set with short hollow noodle pull down to thighs x 10  - yellow noodle under arms with UE on wall-> white noodle under arms with UE on corner: cycling (difficulty maintaining neutral trunk)  10/29/23:  Nustep L 5 x 7 min x 4 exts  Yellow t band B shoulder ER  Yellow t  band rows band under feet B In ll bars with blue t band around thighs, for  alt hip abd 15 x In ll bars, blue t band around thighs for alt SLR, opp hip ext ,rocking forward and back Knee flexion B 35# 3 x 10  Knee ext 10# 3 x 10 Supine for 65 cm ball , B knee to chest, LTR, alt SLR, bridging all 15x Side lying R for deep cross friction massage L lateral hip musculature with theragun   10/27/23:  Knee ext B,10 # 3 x 10 Knee flex B 35# 3 x 10 In ll bars for heel/toe rocks on airex 15 x In ll bars with blue t band around thighs, for alt hip abd 15 x In ll bars, blue t band around thighs for alt SLR, opp hip ext ,rocking forward and back Nustep L 5 x 6 min x 4 exts Side lying for deep cross friction massage L lateral hip over TFL, glut medius with theragun Supine with 65 cm physioball under feet for B knee to chest, LTR, bridging all 10 x  Seated for brief assessment of R shoulder, B shoulder flexion limited, R to 90, L to 110 B shoulder IR/ER wnl MMT B shoulders with pain and weakness R ER, B shoulder flex and abd   10/08/23:  Eval:  side lying on each side for theragun each hip lateral musculature with improved Sx.  PATIENT EDUCATION:  Education details: exercise rationale,  progression and modification  Person educated: Patient  Education method: Explanation, demonstration Education comprehension: verbalized understanding  HOME EXERCISE PROGRAM: Access Code: [email or web address] URL: [email or web address] Date: 11/24/2023 Prepared by: Almetta Fam  Exercises - Step Up  - 1 x daily - 7 x weekly - 2 sets - 10 reps - Lateral Step Up  - 1 x daily - 7 x weekly - 2 sets - 10 reps - Sit to Stand  - 1 x daily - 7 x weekly - 2 sets - 10 reps - Seated Long Arc Quad  - 1 x daily - 7 x weekly - 2 sets - 10 reps  AQUATIC Access Code: [email or web address] URL: [email or web address] Date: 12/16/2023 * not issued yet Prepared by: [facility] [address] This aquatic home exercise program from MedBridge utilizes pictures from land based exercises, but has been adapted prior to lamination and issuance.    ASSESSMENT:  CLINICAL IMPRESSION:  Began instruction on aquatic HEP.  Pt is given VC throughout session in explanation of Hep for understanding and demonstration where needed for execution.  Written clarification noted on program.  She completes ~95% of program. Will be issued next/last aquatic session. She demonstrates continued improvements I core strength and balance. No pain reported before, during or after session. She reports feeling good and stretch out. I do anticipate that pt will have reached her max potential in setting and will be ready to return/ transition fully to land based intervention.      Eval: Patient is a 68 y.o. female who was evaluated today for physical therapy due to chronic pain multiple jts. Presents with postural changes remarkably B knee valgum, also weakness B hips, decreased walking tolerance.  Would benefit from PT with combination of manual techniques, therex, modalities to improve sx and work with her [facility] We discussed aquatic therapy due to multiple jt involvement and  she is very interested in pursuing this aspect of PT  OBJECTIVE IMPAIRMENTS: cardiopulmonary status limiting activity, decreased activity tolerance, decreased balance, decreased endurance, decreased knowledge of condition, decreased mobility, difficulty walking, decreased  ROM, decreased strength, increased fascial restrictions, impaired perceived functional ability, postural dysfunction, and pain.   ACTIVITY LIMITATIONS: carrying, standing, squatting, stairs, transfers, and locomotion level  PARTICIPATION LIMITATIONS: cleaning, laundry, shopping, community activity, and yard work  PERSONAL FACTORS: Behavior pattern, Time since onset of injury/illness/exacerbation, and 1-2 comorbidities: COPD are also affecting patient's functional outcome.   REHAB POTENTIAL: Good  CLINICAL DECISION MAKING: Evolving/moderate complexity  EVALUATION COMPLEXITY: Moderate   GOALS: Goals reviewed with patient? Yes  SHORT TERM GOALS: Target date: 2 weeks, 10/22/23 I HEP Baseline: Goal status: just uses small pedal machine at home 11/24/23   LONG TERM GOALS: Target date: 01/01/24, 12 weeks  LEFSLower Extremity Functional Score: 35 / 80 = 43.8 % improve to 30%or less disability Baseline:  Goal status: 25/80 going 11/24/23  2.  TUG improve from 18 to 14 or less Baseline:  Goal status: 15s 11/24/23  3.  Strength B hip abd, ext 5/5 Baseline: 3+ and 4-/5 Goal status: ongoing 11/24/23     PLAN:  PT FREQUENCY: 2x/week  PT DURATION: 12 weeks  PLANNED INTERVENTIONS: 97110-Therapeutic exercises, 97530- Therapeutic activity, 97112- Neuromuscular re-education, 97535- Self Care, 02859- Manual therapy, [phone]- Aquatic Therapy, and Patient/Family education  PLAN FOR NEXT SESSION: progress balance and strengthening  Ronal Foots) Lonzell Dorris MPT 12/22/23 11:41 AM [facility] [address] [place], [place], [address] Phone: [phone]   Fax:   [phone]

## 2023-12-23 ENCOUNTER — Other Ambulatory Visit: Payer: Self-pay

## 2023-12-23 ENCOUNTER — Encounter: Payer: Self-pay | Admitting: Obstetrics and Gynecology

## 2023-12-23 ENCOUNTER — Ambulatory Visit: Admitting: Obstetrics and Gynecology

## 2023-12-23 ENCOUNTER — Other Ambulatory Visit (HOSPITAL_COMMUNITY)
Admission: RE | Admit: 2023-12-23 | Discharge: 2023-12-23 | Disposition: A | Discharge status: Home / Self Care | Source: Ambulatory Visit | Attending: Obstetrics and Gynecology | Admitting: Obstetrics and Gynecology

## 2023-12-23 ENCOUNTER — Other Ambulatory Visit (HOSPITAL_COMMUNITY): Payer: Self-pay

## 2023-12-23 VITALS — BP 127/79 | HR 60 | Ht 70.0 in | Wt 360.0 lb

## 2023-12-23 DIAGNOSIS — Z01419 Encounter for gynecological examination (general) (routine) without abnormal findings: Secondary | ICD-10-CM

## 2023-12-23 DIAGNOSIS — Z124 Encounter for screening for malignant neoplasm of cervix: Secondary | ICD-10-CM

## 2023-12-23 MED ORDER — OLMESARTAN MEDOXOMIL 40 MG PO TABS
40.0000 mg | ORAL_TABLET | Freq: Every day | ORAL | 3 refills | Status: AC
  Filled 2023-12-23: qty 90, 90d supply, fill #0

## 2023-12-23 NOTE — Progress Notes (Signed)
 ANNUAL EXAM Patient name: NUBIA ZIESMER MRN [id number]  Date of birth: [birth year] Chief Complaint:   Gynecologic Exam  History of Present Illness:   Rose Miller is a 68 y.o. menopausal [phone] being seen today for a routine annual exam.  Current complaints: None   Last pap 03/29/16. Results were: NILM w/ HRHPV negative. H/O abnormal pap: no Last mammogram: 12/15/23. Results were: normal - BIRADS 1 Last colonoscopy: 05/01/21. Results were: abnormal tubular adenoma - q5y interval DEXA: normal 02/21/23     12/23/2023    1:30 PM 11/03/2023    3:49 PM 09/30/2023    5:22 PM 09/26/2023    5:27 PM 09/18/2023   11:13 AM  Depression screen PHQ 2/9  Decreased Interest 0 0 0 0 0  Down, Depressed, Hopeless 0 0 0 0 0  PHQ - 2 Score 0 0 0 0 0  Altered sleeping 1      Tired, decreased energy 1      Change in appetite 0      Feeling bad or failure about yourself  0      Trouble concentrating 0      Moving slowly or fidgety/restless 1      Suicidal thoughts 0      PHQ-9 Score 3      Difficult doing work/chores Not difficult at all            12/23/2023    1:30 PM 03/14/2022   11:03 AM  GAD 7 : Generalized Anxiety Score  Nervous, Anxious, on Edge 0 0  Control/stop worrying 0 0  Worry too much - different things 0 0  Trouble relaxing 0 0  Restless 0 0  Easily annoyed or irritable 0 0  Afraid - awful might happen 0 0  Total GAD 7 Score 0 0  Anxiety Difficulty Not difficult at all Not difficult at all     Review of Systems:   Pertinent items are noted in HPI Denies any headaches, blurred vision, fatigue, shortness of breath, chest pain, abdominal pain, abnormal vaginal discharge/itching/odor/irritation, problems with periods, bowel movements, urination, or intercourse unless otherwise stated above. Pertinent History Reviewed:  Reviewed past medical,surgical, social and family history.  Reviewed problem list, medications and allergies. Physical Assessment:   Vitals:   12/23/23  1320 12/23/23 1331  BP: (!) 143/81 127/79  Pulse: 65 60  Weight: (!) 360 lb (163.3 kg)   Height: 5' 10 (1.778 m)   Body mass index is 51.65 kg/m.        Physical Examination:   General appearance - well appearing, and in no distress  Mental status - alert, oriented to person, place, and time  Chest - respiratory effort normal  Heart - normal peripheral perfusion  Breasts - offered, declined  Abdomen - soft, nontender, nondistended, no masses or organomegaly  Pelvic - VULVA: normal appearing vulva with no masses, tenderness or lesions. Has hypopigmentation around the introitus and clitoris that she reports has been present since at least her last pap. VAGINA: normal appearing vagina with normal color and discharge, no lesions  CERVIX: normal appearing cervix without discharge or lesions, no CMT  Thin prep pap is done with HR HPV cotesting  UTERUS: difficult to palpate with habitus, but no tenderness. Notes history of fibroid, potentially palpable in LUS  ADNEXA: No adnexal masses or tenderness noted.  Chaperone present for exam  No results found for this or any previous visit (from the past 24 hours).  Assessment &  Plan:  1) Well-Woman Exam Mammogram: in 1 year, or sooner if problems Colonoscopy: due 2028, or sooner if problems Pap: Collected today - discussed risks/benefits of continuing cervical cancer screening. This will be her second pap in 10 years and she has no history of abnormal paps. If this one is normal, she would like to discontinue cervical cancer screening.  HIV/HCV: UTD  Labs/procedures today:  No orders of the defined types were placed in this encounter.  Meds: No orders of the defined types were placed in this encounter.  Follow-up: Return as needed.  Kieth JAYSON Carolin, MD 12/23/2023 1:56 PM

## 2023-12-23 NOTE — Progress Notes (Signed)
 68 y.o. GYN presents for AEX/PAP.  C/o bumps/blisters on labia.  Denies pain, itching, discharge.   Last Mammogram 12/15/2023 Negative.

## 2023-12-25 ENCOUNTER — Ambulatory Visit (HOSPITAL_BASED_OUTPATIENT_CLINIC_OR_DEPARTMENT_OTHER): Admitting: Physical Therapy

## 2023-12-25 ENCOUNTER — Encounter (HOSPITAL_BASED_OUTPATIENT_CLINIC_OR_DEPARTMENT_OTHER): Payer: Self-pay | Admitting: Physical Therapy

## 2023-12-25 DIAGNOSIS — R262 Difficulty in walking, not elsewhere classified: Secondary | ICD-10-CM | POA: Diagnosis not present

## 2023-12-25 DIAGNOSIS — M5459 Other low back pain: Secondary | ICD-10-CM

## 2023-12-25 DIAGNOSIS — G8929 Other chronic pain: Secondary | ICD-10-CM

## 2023-12-25 DIAGNOSIS — R293 Abnormal posture: Secondary | ICD-10-CM

## 2023-12-25 NOTE — Therapy (Signed)
 OUTPATIENT PHYSICAL THERAPY LOWER EXTREMITY TREATMENT    Patient Name: Rose Miller MRN: [id number] DOB:1955/06/30, 68 y.o., female Today's Date: 12/25/2023  END OF SESSION:  PT End of Session - 12/25/23 1018     Visit Number 17    Date for Recertification  01/01/24    Progress Note Due on Visit 20    PT Start Time 1015    PT Stop Time 1055    PT Time Calculation (min) 40 min    Activity Tolerance Patient tolerated treatment well    Behavior During Therapy [facility] for tasks assessed/performed              Past Medical History:  Diagnosis Date   Adhesive capsulitis    Aortic valve disease    Arthritis    Asthma    Back pain    CKD (chronic kidney disease) stage 3, GFR 30-59 ml/min (HCC)    Constipation    COPD (chronic obstructive pulmonary disease) (HCC)    Diabetes mellitus without complication (HCC) 10 20 2020   Diuretic-induced hypokalemia    Dyspnea    Fibroid    Heart murmur    Hypercholesterolemia    Hypertension    IT band syndrome    Morbid obesity (HCC)    Palpitations    Periumbilical pain    Pneumonia    Postmenopausal estrogen deficiency    Prediabetes    Preterm labor    S/P TAVR (transcatheter aortic valve replacement) 12/26/2020   Ventral hernia    Past Surgical History:  Procedure Laterality Date   BIOPSY  05/01/2021   Procedure: BIOPSY;  Surgeon: Charlanne Groom, MD;  Location: [facility];  Service: Gastroenterology;;   CARDIAC CATHETERIZATION  11/24/2020   CESAREAN SECTION  1989   COLONOSCOPY     COLONOSCOPY WITH PROPOFOL  N/A 05/01/2021   Procedure: COLONOSCOPY WITH PROPOFOL ;  Surgeon: Charlanne Groom, MD;  Location: [facility];  Service: Gastroenterology;  Laterality: N/A;   ESOPHAGOGASTRODUODENOSCOPY (EGD) WITH PROPOFOL  N/A 05/01/2021   Procedure: ESOPHAGOGASTRODUODENOSCOPY (EGD) WITH PROPOFOL ;  Surgeon: Charlanne Groom, MD;  Location: [facility];  Service: Gastroenterology;  Laterality: N/A;   INTRAOPERATIVE TRANSTHORACIC  ECHOCARDIOGRAM N/A 12/26/2020   Procedure: INTRAOPERATIVE TRANSTHORACIC ECHOCARDIOGRAM;  Surgeon: Wendel Lurena POUR, MD;  Location: [facility];  Service: Open Heart Surgery;  Laterality: N/A;   MALONEY DILATION  05/01/2021   Procedure: AGAPITO DILATION;  Surgeon: Charlanne Groom, MD;  Location: [facility];  Service: Gastroenterology;;   POLYPECTOMY  05/01/2021   Procedure: POLYPECTOMY;  Surgeon: Charlanne Groom, MD;  Location: [facility];  Service: Gastroenterology;;   RIGHT/LEFT HEART CATH AND CORONARY ANGIOGRAPHY N/A 11/24/2020   Procedure: RIGHT/LEFT HEART CATH AND CORONARY ANGIOGRAPHY;  Surgeon: Claudene Victory ORN, MD;  Location: [facility] INVASIVE CV LAB;  Service: Cardiovascular;  Laterality: N/A;   TONSILLECTOMY AND ADENOIDECTOMY  1960   TRANSCATHETER AORTIC VALVE REPLACEMENT, TRANSFEMORAL N/A 12/26/2020   Procedure: TRANSCATHETER AORTIC VALVE REPLACEMENT, TRANSFEMORAL;  Surgeon: Wendel Lurena POUR, MD;  Location: [facility];  Service: Open Heart Surgery;  Laterality: N/A;   Patient Active Problem List   Diagnosis Date Noted   Acquired hammer toe of right foot 09/26/2023   Atherosclerosis of artery of both lower extremities 09/26/2023   Polyneuropathy due to type 2 diabetes mellitus (HCC) 09/26/2023   Primary localized osteoarthrosis of ankle and foot 09/26/2023   Kidney lesion 04/14/2023   Abscess of lower back 03/17/2023   Ingrown nail of great toe of left foot 12/16/2022  Vitamin D  deficiency 12/16/2022   Low back pain 12/16/2022   Chronic pain of both knees 12/16/2022   Dyspnea on exertion 12/16/2022   Coronary artery disease due to calcified coronary lesion 12/16/2022   Aortic atherosclerosis 12/16/2022   PAF (paroxysmal atrial fibrillation) (HCC) 03/14/2022   Otalgia of left ear 03/14/2022   Diplopia 09/12/2021   Allergic rhinitis 09/12/2021   Pharyngoesophageal dysphagia    Family history of colon cancer in father    Aortic valve disease 12/26/2020   COPD (chronic  obstructive pulmonary disease) (HCC) 12/26/2020   Postmenopausal estrogen deficiency 02/18/2019   Former smoker 05/13/2018   Long-term use of aspirin  therapy 05/13/2018   Peripheral vascular disease 05/05/2018   Prediabetes 05/05/2018   Type 2 diabetes mellitus in patient with obesity (HCC) 03/31/2017   CKD stage 3 secondary to diabetes (HCC) 03/31/2017   Hypercholesterolemia 06/28/2016   Aortic valve stenosis 03/29/2016   Periumbilical pain 03/29/2016   IT band syndrome 09/11/2014   Right shoulder pain 09/11/2014   Ventral hernia 05/01/2014   Morbid obesity (HCC) 03/17/2014    PCP: Sebastian Righter MD  REFERRING PROVIDER: same  REFERRING DIAG: lower back, B knee, L hip pain  THERAPY DIAG:  Difficulty in walking, not elsewhere classified  Other low back pain  Chronic pain of both knees  Abnormal posture  Rationale for Evaluation and Treatment: Rehabilitation  ONSET DATE: December 2024  SUBJECTIVE:   SUBJECTIVE STATEMENT: Pain very low maybe 1/10  POOL ACCESS:  Pt is member of [employer].    PERTINENT HISTORY: Referred by PCP due to pain complaints knees, hips lower back PAIN:  Are you having pain? Yes: NPRS scale: 2/10 Pain location: lateral hips, B knees, Rt shoulder  Pain description: deep pain with walking Aggravating factors: walking Relieving factors: resting  PRECAUTIONS: Fall  RED FLAGS: None   WEIGHT BEARING RESTRICTIONS: No  FALLS:  Has patient fallen in last 6 months? No  LIVING ENVIRONMENT: Lives with: lives with their son Lives in: House/apartment Stairs: steps outdoors Has following equipment at home: None  OCCUPATION: retired  PLOF: Independent  PATIENT GOALS: be able to walk without pain  NEXT MD VISIT: 1 month  OBJECTIVE:  Note: Objective measures were completed at Evaluation unless otherwise noted.  DIAGNOSTIC FINDINGS: na  PATIENT SURVEYS:  Lower Extremity Functional Score: 35 / 80 = 43.8 % 11/24/23: 25/80  = COGNITION: Overall cognitive status: Within functional limits for tasks assessed     SENSATION: WFL    MUSCLE LENGTH: Hamstrings: Right wfl deg; Left wfl deg Debby test: Right nt deg; Left  nt deg  POSTURE: marked valgum B knees, greater on R  PALPATION: Tender L lateral hip musculature, TFL Tender R glut medius, minimus,  LOWER EXTREMITY ROM:  Passive ROM Right eval Left eval Left  12/18/23 PROM  Hip flexion 105 95   Hip extension     Hip abduction     Hip adduction     Hip internal rotation     Hip external rotation     Knee flexion 112 102 107  Knee extension -3 -12  Lacking 3  Ankle dorsiflexion     Ankle plantarflexion     Ankle inversion     Ankle eversion      (Blank rows = not tested)  LOWER EXTREMITY MMT:  MMT Right eval Left eval Right 11/24/23 Left 11/24/23  Hip flexion 4- 4- 4 4  Hip extension      Hip abduction 3+ 3- 4  4  Hip adduction      Hip internal rotation      Hip external rotation      Knee flexion      Knee extension      Ankle dorsiflexion      Ankle plantarflexion 4- 4-    Ankle inversion      Ankle eversion       (Blank rows = not tested)   FUNCTIONAL TESTS:  Timed up and go (TUG): 18.35 11/24/23:  TUG 15s   GAIT: Distance walked: 58' in clinic, no device, labored gait, slowed cadence decreased stride length B                                                                                                                              TREATMENT DATE:  [facility] Adult PT Treatment:                                             Date: 12/25/2023 Pt seen for aquatic therapy today.  Treatment took place in water 3.5-4.75 ft in depth at the [facility] pool. Temp of water was 91.  Pt entered/exited the pool via stairs independently with bil rail.   Exercises - walking forward/ backward with gentle arm swing   - Side Stepping with or without Hand floats: horizontal add/abd-> abd add - suitcase carry with single hand  float at side (or on both sides), walking forward/ backward   - Leg Swings Side to Side - hold wall or hand floats    - Forward Backward Leg Swing - hold wall or hand floats   - Heel and toe raises , holding wall in pool or hand floats   - Standing Fold Over Stretch at [employer] and Backwards with Row Motion with Hand Floats   - Single Leg Clam - hold wall then hand floats  - Sit to Stand with forward arm reach (stick your bottom out and bend your knees) - slow and controlled motion   - Seated straddle on noodle, holding corner, bicycle legs     [facility] Adult PT Treatment:                                             Date: 12/16/2023 Pt seen for aquatic therapy today.  Treatment took place in water 3.5-4.75 ft in depth at the [facility] pool. Temp of water was 91.  Pt entered/exited the pool via stairs independently with bil rail.  - unsupported:  walking/backward x 3 laps - side stepping with arm horiz add/abdct with rainbow hand floats 2 laps -> add/abdct x 2 laps - short squatted rest at wall - suitcase carry with  bil/ single rainbow hand floats at side under water then unilaterally, walking forward/ backward - squats without UE support -> pushing single rainbow hand float under water - UE on rainbow hand floats 4.3 ft: forward walking kicks x 2 laps; toe/heel raises x 10; hip abdct/ add x 12 each; hip flexion/ extension x 10 each ; return to walking forward/ backward - L stretch x 3 breaths x 3 reps - STS on 3rd step x 5, cues for form  -straddling noodle and UE on corner: cycling; hip abdct/ add   11/21/23 10th visit PN -LEFS -TUG -MMT NuStep L5x35mins  Step ups 6  Resisted gait 30# forwards x4, side steps x3 STS 2x10 HEP   [facility] Adult PT Treatment:                                             Date: 11/19/2023 Pt seen for aquatic therapy today.  Treatment took place in water 3.5-4.75 ft in depth at the [facility] pool. Temp of water was 91.   Pt entered/exited the pool via stairs independently with bil rail.  - unsupported:  walking/backward x 4 laps with reciprocal row motion, - side stepping with arm add/abdct with short hollow noodles x 2 laps - TrA set with short hollow noodle pull down to thighs in staggered stance x 5 each - suitcase carry at 4 ft, with bil short hollow noodles under water -> single rainbow hand float at side, walking forward/backward - UE on yellow hand floats: toe/heel raises x 15; alternating hamstring curls x 10 each LE; hip abdct/ add 3x5; hip crosses x 10 each - short rest in squatted position - UE on wall: single leg clams x 10 each; (at 42ft6) wide leg, side to side lunge hold for adductor stretch; (at 10ft 8) hip flexion /extension x10; hip circles x 10 (5 CW/5CCW)  - forward walking kick - STS from 3rd step in water, x 5 with cues for neutral head, core engaged, forward arm reach  [facility] Adult PT Treatment:                                             Date: 11/17/2023 Pt seen for aquatic therapy today.  Treatment took place in water 3.5-4.75 ft in depth at the [facility] pool. Temp of water was 91.  Pt entered/exited the pool via stairs independently with bil rail.  - unsupported:  walking/backward x 3 laps with reciprocal row motion, side stepping with arm add/abdct with short hollow noodles x 3 laps - squatted rest at wall - UE on wall: alternating hamstring curls x 10 each LE; single leg clams x 10 each (improved); toe/heel raises x 20; hip flexion /extension 2x10; hip abdct/ add 2x10; squatted rest; hip crosses x 10 each - UE on yellow hand floats: forward walking kicks  - suitcase carry at 4 ft, with bil short hollow noodles under water -> single rainbow hand float at side, walking forward/backward - STS from 3rd step in water, x 5 with cues for neutral head, core engaged, forward arm reach   PATIENT EDUCATION:  Education details: exercise rationale, progression and modification   Person educated: Patient  Education method: Explanation, demonstration Education comprehension: verbalized understanding  HOME EXERCISE PROGRAM:  Access Code: [email or web address] URL: [email or web address] Date: 11/24/2023 Prepared by: Almetta Fam  Exercises - Step Up  - 1 x daily - 7 x weekly - 2 sets - 10 reps - Lateral Step Up  - 1 x daily - 7 x weekly - 2 sets - 10 reps - Sit to Stand  - 1 x daily - 7 x weekly - 2 sets - 10 reps - Seated Long Arc Quad  - 1 x daily - 7 x weekly - 2 sets - 10 reps  AQUATIC Access Code: [email or web address] URL: [email or web address] Date: 12/16/2023 * not issued yet Prepared by: [facility] [address] This aquatic home exercise program from MedBridge utilizes pictures from land based exercises, but has been adapted prior to lamination and issuance.    ASSESSMENT:  CLINICAL IMPRESSION:  Continued with instruction on final aquatic HEP.  She continues to be provided vc and demonstration for completion.  She does get through entire program today with good toleration and benefit.  Able to encourage approp exercises to be completed with use  of HB rather than wall for added core engagement and balance challenge.  Plan to see her 1 more time in aquatics then she will return to land based PT with supervising therapist.  Pt will have reached her max potential in setting and will be ready to continue with aquatic HEP at her [employer]. Program issued.      Eval: Patient is a 68 y.o. female who was evaluated today for physical therapy due to chronic pain multiple jts. Presents with postural changes remarkably B knee valgum, also weakness B hips, decreased walking tolerance.  Would benefit from PT with combination of manual techniques, therex, modalities to improve sx and work with her [facility] We discussed aquatic therapy due to multiple jt involvement and she is very interested in pursuing this  aspect of PT  OBJECTIVE IMPAIRMENTS: cardiopulmonary status limiting activity, decreased activity tolerance, decreased balance, decreased endurance, decreased knowledge of condition, decreased mobility, difficulty walking, decreased ROM, decreased strength, increased fascial restrictions, impaired perceived functional ability, postural dysfunction, and pain.   ACTIVITY LIMITATIONS: carrying, standing, squatting, stairs, transfers, and locomotion level  PARTICIPATION LIMITATIONS: cleaning, laundry, shopping, community activity, and yard work  PERSONAL FACTORS: Behavior pattern, Time since onset of injury/illness/exacerbation, and 1-2 comorbidities: COPD are also affecting patient's functional outcome.   REHAB POTENTIAL: Good  CLINICAL DECISION MAKING: Evolving/moderate complexity  EVALUATION COMPLEXITY: Moderate   GOALS: Goals reviewed with patient? Yes  SHORT TERM GOALS: Target date: 2 weeks, 10/22/23 I HEP Baseline: Goal status: just uses small pedal machine at home 11/24/23   LONG TERM GOALS: Target date: 01/01/24, 12 weeks  LEFSLower Extremity Functional Score: 35 / 80 = 43.8 % improve to 30%or less disability Baseline:  Goal status: 25/80 going 11/24/23  2.  TUG improve from 18 to 14 or less Baseline:  Goal status: 15s 11/24/23  3.  Strength B hip abd, ext 5/5 Baseline: 3+ and 4-/5 Goal status: ongoing 11/24/23     PLAN:  PT FREQUENCY: 2x/week  PT DURATION: 12 weeks  PLANNED INTERVENTIONS: 97110-Therapeutic exercises, 97530- Therapeutic activity, 97112- Neuromuscular re-education, 97535- Self Care, [id number]- Manual therapy, [id number]- Aquatic Therapy, and Patient/Family education  PLAN FOR NEXT SESSION: progress balance and strengthening  Ronal Foots) Brandon Scarbrough MPT 12/25/2023 10:27 AM [facility] [address] [place], [place], [address] Phone: [phone]   Fax:  [phone]

## 2023-12-29 ENCOUNTER — Encounter (HOSPITAL_BASED_OUTPATIENT_CLINIC_OR_DEPARTMENT_OTHER): Payer: Self-pay | Admitting: Physical Therapy

## 2023-12-29 ENCOUNTER — Ambulatory Visit (HOSPITAL_BASED_OUTPATIENT_CLINIC_OR_DEPARTMENT_OTHER): Admitting: Physical Therapy

## 2023-12-29 DIAGNOSIS — R262 Difficulty in walking, not elsewhere classified: Secondary | ICD-10-CM | POA: Diagnosis not present

## 2023-12-29 DIAGNOSIS — M5459 Other low back pain: Secondary | ICD-10-CM

## 2023-12-29 DIAGNOSIS — G8929 Other chronic pain: Secondary | ICD-10-CM

## 2023-12-29 LAB — CYTOLOGY - PAP
Comment: NEGATIVE
Diagnosis: NEGATIVE
High risk HPV: NEGATIVE

## 2023-12-29 NOTE — Therapy (Signed)
 OUTPATIENT PHYSICAL THERAPY LOWER EXTREMITY TREATMENT  PHYSICAL THERAPY DISCHARGE SUMMARY  Visits from Start of Care: 18  Current functional level related to goals / functional outcomes: indep   Remaining deficits: Low level of pain   Education / Equipment: Management of condition/HEP   Patient agrees to discharge. Patient goals were met. Patient is being discharged due to meeting the stated rehab goals.   Patient Name: Rose Miller MRN: [id number] DOB:Mar 24, 1955, 68 y.o., female Today's Date: 12/29/2023  END OF SESSION:  PT End of Session - 12/29/23 1235     Visit Number 18    Date for Recertification  01/01/24    Progress Note Due on Visit 20    PT Start Time 1105    PT Stop Time 1145    PT Time Calculation (min) 40 min    Activity Tolerance Patient tolerated treatment well    Behavior During Therapy [facility] for tasks assessed/performed              Past Medical History:  Diagnosis Date   Adhesive capsulitis    Aortic valve disease    Arthritis    Asthma    Back pain    CKD (chronic kidney disease) stage 3, GFR 30-59 ml/min (HCC)    Constipation    COPD (chronic obstructive pulmonary disease) (HCC)    Diabetes mellitus without complication (HCC) 10 20 2020   Diuretic-induced hypokalemia    Dyspnea    Fibroid    Heart murmur    Hypercholesterolemia    Hypertension    IT band syndrome    Morbid obesity (HCC)    Palpitations    Periumbilical pain    Pneumonia    Postmenopausal estrogen deficiency    Prediabetes    Preterm labor    S/P TAVR (transcatheter aortic valve replacement) 12/26/2020   Ventral hernia    Past Surgical History:  Procedure Laterality Date   BIOPSY  05/01/2021   Procedure: BIOPSY;  Surgeon: Charlanne Groom, MD;  Location: [facility];  Service: Gastroenterology;;   CARDIAC CATHETERIZATION  11/24/2020   CESAREAN SECTION  1989   COLONOSCOPY     COLONOSCOPY WITH PROPOFOL  N/A 05/01/2021   Procedure: COLONOSCOPY WITH  PROPOFOL ;  Surgeon: Charlanne Groom, MD;  [facility]: [facility];  Service: Gastroenterology;  Laterality: N/A;   ESOPHAGOGASTRODUODENOSCOPY (EGD) WITH PROPOFOL  N/A 05/01/2021   Procedure: ESOPHAGOGASTRODUODENOSCOPY (EGD) WITH PROPOFOL ;  Surgeon: Charlanne Groom, MD;  [facility];  Service: Gastroenterology;  Laterality: N/A;   INTRAOPERATIVE TRANSTHORACIC ECHOCARDIOGRAM N/A 12/26/2020   Procedure: INTRAOPERATIVE TRANSTHORACIC ECHOCARDIOGRAM;  Surgeon: Wendel Lurena POUR, MD;  [facility];  Service: Open Heart Surgery;  Laterality: N/A;   MALONEY DILATION  05/01/2021   Procedure: AGAPITO DILATION;  Surgeon: Charlanne Groom, MD;  Location: [facility];  Service: Gastroenterology;;   POLYPECTOMY  05/01/2021   Procedure: POLYPECTOMY;  Surgeon: Charlanne Groom, MD;  [facility];  Service: Gastroenterology;;   RIGHT/LEFT HEART CATH AND CORONARY ANGIOGRAPHY N/A 11/24/2020   Procedure: RIGHT/LEFT HEART CATH AND CORONARY ANGIOGRAPHY;  Surgeon: Claudene Victory ORN, MD;  Location: [facility] CV LAB;  Service: Cardiovascular;  Laterality: N/A;   TONSILLECTOMY AND ADENOIDECTOMY  1960   TRANSCATHETER AORTIC VALVE REPLACEMENT, TRANSFEMORAL N/A 12/26/2020   Procedure: TRANSCATHETER AORTIC VALVE REPLACEMENT, TRANSFEMORAL;  Surgeon: Wendel Lurena POUR, MD;  [facility];  Service: Open Heart Surgery;  Laterality: N/A;   Patient Active Problem List   Diagnosis Date Noted   Acquired hammer toe of  right foot 09/26/2023   Atherosclerosis of artery of both lower extremities 09/26/2023   Polyneuropathy due to type 2 diabetes mellitus (HCC) 09/26/2023   Primary localized osteoarthrosis of ankle and foot 09/26/2023   Kidney lesion 04/14/2023   Abscess of lower back 03/17/2023   Ingrown nail of great toe of left foot 12/16/2022   Vitamin D  deficiency 12/16/2022   Low back pain 12/16/2022   Chronic pain of both knees 12/16/2022   Dyspnea on exertion 12/16/2022   Coronary  artery disease due to calcified coronary lesion 12/16/2022   Aortic atherosclerosis 12/16/2022   PAF (paroxysmal atrial fibrillation) (HCC) 03/14/2022   Otalgia of left ear 03/14/2022   Diplopia 09/12/2021   Allergic rhinitis 09/12/2021   Pharyngoesophageal dysphagia    Family history of colon cancer in father    Aortic valve disease 12/26/2020   COPD (chronic obstructive pulmonary disease) (HCC) 12/26/2020   Postmenopausal estrogen deficiency 02/18/2019   Former smoker 05/13/2018   Long-term use of aspirin  therapy 05/13/2018   Peripheral vascular disease 05/05/2018   Prediabetes 05/05/2018   Type 2 diabetes mellitus in patient with obesity (HCC) 03/31/2017   CKD stage 3 secondary to diabetes (HCC) 03/31/2017   Hypercholesterolemia 06/28/2016   Aortic valve stenosis 03/29/2016   Periumbilical pain 03/29/2016   IT band syndrome 09/11/2014   Right shoulder pain 09/11/2014   Ventral hernia 05/01/2014   Morbid obesity (HCC) 03/17/2014    PCP: Sebastian Righter MD  REFERRING PROVIDER: same  REFERRING DIAG: lower back, B knee, L hip pain  THERAPY DIAG:  Difficulty in walking, not elsewhere classified  Other low back pain  Chronic pain of both knees  Rationale for Evaluation and Treatment: Rehabilitation  ONSET DATE: December 2024  SUBJECTIVE:   SUBJECTIVE STATEMENT: Doing well  POOL ACCESS:  Pt is member of [employer].    PERTINENT HISTORY: Referred by PCP due to pain complaints knees, hips lower back PAIN:  Are you having pain? Yes: NPRS scale: 2/10 Pain location: lateral hips, B knees, Rt shoulder  Pain description: deep pain with walking Aggravating factors: walking Relieving factors: resting  PRECAUTIONS: Fall  RED FLAGS: None   WEIGHT BEARING RESTRICTIONS: No  FALLS:  Has patient fallen in last 6 months? No  LIVING ENVIRONMENT: Lives with: lives with their son Lives in: House/apartment Stairs: steps outdoors Has following equipment at home:  None  OCCUPATION: retired  PLOF: Independent  PATIENT GOALS: be able to walk without pain  NEXT MD VISIT: 1 month  OBJECTIVE:  Note: Objective measures were completed at Evaluation unless otherwise noted.  DIAGNOSTIC FINDINGS: na  PATIENT SURVEYS:  Lower Extremity Functional Score: 35 / 80 = 43.8 % 11/24/23: 25/80 =31 % 12/29/23: 22/80=27% COGNITION: Overall cognitive status: Within functional limits for tasks assessed     SENSATION: WFL    MUSCLE LENGTH: Hamstrings: Right wfl deg; Left wfl deg Debby test: Right nt deg; Left  nt deg  POSTURE: marked valgum B knees, greater on R  PALPATION: Tender L lateral hip musculature, TFL Tender R glut medius, minimus,  LOWER EXTREMITY ROM:  Passive ROM Right eval Left eval Left  12/18/23 PROM  Hip flexion 105 95   Hip extension     Hip abduction     Hip adduction     Hip internal rotation     Hip external rotation     Knee flexion 112 102 107  Knee extension -3 -12  Lacking 3  Ankle dorsiflexion     Ankle plantarflexion  Ankle inversion     Ankle eversion      (Blank rows = not tested)  LOWER EXTREMITY MMT:  MMT Right eval Left eval Right 11/24/23 Left 11/24/23 R / L 12/29/23  Hip flexion 4- 4- 4 4 5   Hip extension       Hip abduction 3+ 3- 4 4 5   Hip adduction       Hip internal rotation       Hip external rotation       Knee flexion       Knee extension       Ankle dorsiflexion       Ankle plantarflexion 4- 4-     Ankle inversion       Ankle eversion        (Blank rows = not tested)   FUNCTIONAL TESTS:  Timed up and go (TUG): 18.35 11/24/23:  TUG 15s  12/29/23: 13.82  GAIT: Distance walked: 31' in clinic, no device, labored gait, slowed cadence decreased stride length B                                                                                                                              TREATMENT DATE:  [facility] Adult PT Treatment:                                              Date: 12/29/2023 Pt seen for aquatic therapy today.  Treatment took place in water 3.5-4.75 ft in depth at the [facility] pool. Temp of water was 91.  Pt entered/exited the pool via stairs independently with bil rail.   Exercises - walking forward/ backward with gentle arm swing   - Side Stepping with or without Hand floats: horizontal add/abd-> abd add - suitcase carry with single hand float at side (or on both sides), walking forward/ backward   - Leg Swings Side to Side - hold wall or hand floats    - Forward Backward Leg Swing - hold wall or hand floats   - Heel and toe raises , holding wall in pool or hand floats   - Standing Fold Over Stretch at [employer] and Backwards with Row Motion with Hand Floats   - Single Leg Clam - hold wall then hand floats  - Sit to Stand with forward arm reach (stick your bottom out and bend your knees) - slow and controlled motion   - Seated straddle on noodle, holding corner, bicycle legs       PATIENT EDUCATION:  Education details: exercise rationale, progression and modification  Person educated: Patient  Education method: Explanation, demonstration Education comprehension: verbalized understanding  HOME EXERCISE PROGRAM: Access Code: [email or web address] URL: [email or web address] Date: 11/24/2023 Prepared by: Almetta Fam  Exercises - Step Up  - 1 x daily - 7 x  weekly - 2 sets - 10 reps - Lateral Step Up  - 1 x daily - 7 x weekly - 2 sets - 10 reps - Sit to Stand  - 1 x daily - 7 x weekly - 2 sets - 10 reps - Seated Long Arc Quad  - 1 x daily - 7 x weekly - 2 sets - 10 reps  AQUATIC Access Code: [email or web address] URL: [email or web address] Date: 12/16/2023 * not issued yet Prepared by: [facility] - [address] This aquatic home exercise program from [employer] utilizes pictures from land based exercises, but has been adapted prior to lamination and issuance.     ASSESSMENT:  CLINICAL IMPRESSION:  Pt demonstrates indep with aquatic HEP. Has access to [facility] for continued completion.  She is instructed today on variations to increase/decrease difficulty/challenge of program.  DC testing complete.  All goals met.  Pt ready for dc as she has reached her max potential in setting.      OBJECTIVE IMPAIRMENTS: cardiopulmonary status limiting activity, decreased activity tolerance, decreased balance, decreased endurance, decreased knowledge of condition, decreased mobility, difficulty walking, decreased ROM, decreased strength, increased fascial restrictions, impaired perceived functional ability, postural dysfunction, and pain.   ACTIVITY LIMITATIONS: carrying, standing, squatting, stairs, transfers, and locomotion level  PARTICIPATION LIMITATIONS: cleaning, laundry, shopping, community activity, and yard work  PERSONAL FACTORS: Behavior pattern, Time since onset of injury/illness/exacerbation, and 1-2 comorbidities: COPD are also affecting patient's functional outcome.   REHAB POTENTIAL: Good  CLINICAL DECISION MAKING: Evolving/moderate complexity  EVALUATION COMPLEXITY: Moderate   GOALS: Goals reviewed with patient? Yes  SHORT TERM GOALS: Target date: 2 weeks, 10/22/23 I HEP Baseline: Goal status: just uses small pedal machine at home 11/24/23   LONG TERM GOALS: Target date: 01/01/24, 12 weeks  LEFSLower Extremity Functional Score: 35 / 80 = 43.8 % improve to 30%or less disability Baseline:  Goal status: 25/80 going 11/24/23; 27% Met 12/29/23  2.  TUG improve from 18 to 14 or less Baseline:  Goal status: 15s 11/24/23; Met 12/29/23  3.  Strength B hip abd, ext 5/5 Baseline: 3+ and 4-/5 Goal status: ongoing 11/24/23; Met 12/29/23     PLAN:  PT FREQUENCY: 2x/week  PT DURATION: 12 weeks  PLANNED INTERVENTIONS: 97110-Therapeutic exercises, 97530- Therapeutic activity, 97112- Neuromuscular re-education, 97535- Self Care, [id number]-  Manual therapy, [phone]- Aquatic Therapy, and Patient/Family education  PLAN FOR NEXT SESSION: progress balance and strengthening  Ronal Foots) Marylen Zuk MPT 12/29/2023 12:37 PM [facility] [address] [place], [place], [address] Phone: [phone]   Fax:  [phone]

## 2023-12-31 ENCOUNTER — Ambulatory Visit: Attending: Family Medicine

## 2023-12-31 DIAGNOSIS — M25561 Pain in right knee: Secondary | ICD-10-CM | POA: Diagnosis present

## 2023-12-31 DIAGNOSIS — M5459 Other low back pain: Secondary | ICD-10-CM | POA: Insufficient documentation

## 2023-12-31 DIAGNOSIS — R293 Abnormal posture: Secondary | ICD-10-CM | POA: Insufficient documentation

## 2023-12-31 DIAGNOSIS — R262 Difficulty in walking, not elsewhere classified: Secondary | ICD-10-CM | POA: Diagnosis present

## 2023-12-31 DIAGNOSIS — G8929 Other chronic pain: Secondary | ICD-10-CM | POA: Diagnosis present

## 2023-12-31 DIAGNOSIS — M25562 Pain in left knee: Secondary | ICD-10-CM | POA: Diagnosis present

## 2023-12-31 NOTE — Therapy (Addendum)
 OUTPATIENT PHYSICAL THERAPY LOWER EXTREMITY TREATMENT  RECERTIFICATION   Patient Name: Rose Miller MRN: [id number] DOB:November 29, 1955, 68 y.o., female Today's Date: 12/31/2023  END OF SESSION:  PT End of Session - 12/31/23 1149     Visit Number 19    Date for Recertification  02/25/24    PT Start Time 1100    PT Stop Time 1144    PT Time Calculation (min) 44 min    Activity Tolerance Patient tolerated treatment well    Behavior During Therapy [facility] for tasks assessed/performed          Past Medical History:  Diagnosis Date   Adhesive capsulitis    Aortic valve disease    Arthritis    Asthma    Back pain    CKD (chronic kidney disease) stage 3, GFR 30-59 ml/min (HCC)    Constipation    COPD (chronic obstructive pulmonary disease) (HCC)    Diabetes mellitus without complication (HCC) 10 20 2020   Diuretic-induced hypokalemia    Dyspnea    Fibroid    Heart murmur    Hypercholesterolemia    Hypertension    IT band syndrome    Morbid obesity (HCC)    Palpitations    Periumbilical pain    Pneumonia    Postmenopausal estrogen deficiency    Prediabetes    Preterm labor    S/P TAVR (transcatheter aortic valve replacement) 12/26/2020   Ventral hernia    Past Surgical History:  Procedure Laterality Date   BIOPSY  05/01/2021   Procedure: BIOPSY;  Surgeon: Charlanne Groom, MD;  [facility]: [facility];  Service: Gastroenterology;;   CARDIAC CATHETERIZATION  11/24/2020   CESAREAN SECTION  1989   COLONOSCOPY     COLONOSCOPY WITH PROPOFOL  N/A 05/01/2021   Procedure: COLONOSCOPY WITH PROPOFOL ;  Surgeon: Charlanne Groom, MD;  Location: [facility];  Service: Gastroenterology;  Laterality: N/A;   ESOPHAGOGASTRODUODENOSCOPY (EGD) WITH PROPOFOL  N/A 05/01/2021   Procedure: ESOPHAGOGASTRODUODENOSCOPY (EGD) WITH PROPOFOL ;  Surgeon: Charlanne Groom, MD;  Location: [facility];  Service: Gastroenterology;  Laterality: N/A;   INTRAOPERATIVE TRANSTHORACIC ECHOCARDIOGRAM N/A 12/26/2020    Procedure: INTRAOPERATIVE TRANSTHORACIC ECHOCARDIOGRAM;  Surgeon: Wendel Lurena POUR, MD;  [facility]: [facility];  Service: Open Heart Surgery;  Laterality: N/A;   MALONEY DILATION  05/01/2021   Procedure: AGAPITO DILATION;  Surgeon: Charlanne Groom, MD;  Location: [facility];  Service: Gastroenterology;;   POLYPECTOMY  05/01/2021   Procedure: POLYPECTOMY;  Surgeon: Charlanne Groom, MD;  Location: [facility];  Service: Gastroenterology;;   RIGHT/LEFT HEART CATH AND CORONARY ANGIOGRAPHY N/A 11/24/2020   Procedure: RIGHT/LEFT HEART CATH AND CORONARY ANGIOGRAPHY;  Surgeon: Claudene Victory ORN, MD;  Location: [facility] INVASIVE CV LAB;  Service: Cardiovascular;  Laterality: N/A;   TONSILLECTOMY AND ADENOIDECTOMY  1960   TRANSCATHETER AORTIC VALVE REPLACEMENT, TRANSFEMORAL N/A 12/26/2020   Procedure: TRANSCATHETER AORTIC VALVE REPLACEMENT, TRANSFEMORAL;  Surgeon: Wendel Lurena POUR, MD;  [facility];  Service: Open Heart Surgery;  Laterality: N/A;   Patient Active Problem List   Diagnosis Date Noted   Acquired hammer toe of right foot 09/26/2023   Atherosclerosis of artery of both lower extremities 09/26/2023   Polyneuropathy due to type 2 diabetes mellitus (HCC) 09/26/2023   Primary localized osteoarthrosis of ankle and foot 09/26/2023   Kidney lesion 04/14/2023   Abscess of lower back 03/17/2023   Ingrown nail of great toe of left foot 12/16/2022   Vitamin D  deficiency 12/16/2022   Low back pain 12/16/2022  Chronic pain of both knees 12/16/2022   Dyspnea on exertion 12/16/2022   Coronary artery disease due to calcified coronary lesion 12/16/2022   Aortic atherosclerosis 12/16/2022   PAF (paroxysmal atrial fibrillation) (HCC) 03/14/2022   Otalgia of left ear 03/14/2022   Diplopia 09/12/2021   Allergic rhinitis 09/12/2021   Pharyngoesophageal dysphagia    Family history of colon cancer in father    Aortic valve disease 12/26/2020   COPD (chronic obstructive pulmonary disease)  (HCC) 12/26/2020   Postmenopausal estrogen deficiency 02/18/2019   Former smoker 05/13/2018   Long-term use of aspirin  therapy 05/13/2018   Peripheral vascular disease 05/05/2018   Prediabetes 05/05/2018   Type 2 diabetes mellitus in patient with obesity (HCC) 03/31/2017   CKD stage 3 secondary to diabetes (HCC) 03/31/2017   Hypercholesterolemia 06/28/2016   Aortic valve stenosis 03/29/2016   Periumbilical pain 03/29/2016   IT band syndrome 09/11/2014   Right shoulder pain 09/11/2014   Ventral hernia 05/01/2014   Morbid obesity (HCC) 03/17/2014    PCP: Sebastian Righter MD  REFERRING PROVIDER: same  REFERRING DIAG: lower back, B knee, L hip pain  THERAPY DIAG:  Difficulty in walking, not elsewhere classified  Other low back pain  Chronic pain of both knees  Abnormal posture  Rationale for Evaluation and Treatment: Rehabilitation  ONSET DATE: December 2024  SUBJECTIVE:   SUBJECTIVE STATEMENT: Pt stated she is doing good; has some bilateral knee pain 4/10 and some low back stiffness upon arrival of PT.   POOL ACCESS:  Pt is member of [employer].    PERTINENT HISTORY: Referred by PCP due to pain complaints knees, hips lower back PAIN:  Are you having pain? Yes: NPRS scale: 2/10 Pain location: lateral hips, B knees, Rt shoulder  Pain description: deep pain with walking Aggravating factors: walking Relieving factors: resting  PRECAUTIONS: Fall  RED FLAGS: None   WEIGHT BEARING RESTRICTIONS: No  FALLS:  Has patient fallen in last 6 months? No  LIVING ENVIRONMENT: Lives with: lives with their son Lives in: House/apartment Stairs: steps outdoors Has following equipment at home: None  OCCUPATION: retired  PLOF: Independent  PATIENT GOALS: be able to walk without pain  NEXT MD VISIT: 1 month  OBJECTIVE:  Note: Objective measures were completed at Evaluation unless otherwise noted.  DIAGNOSTIC FINDINGS: na  PATIENT SURVEYS:  Lower Extremity Functional  Score: 35 / 80 = 43.8 % 11/24/23: 25/80 =31 % 12/29/23: 22/80=27% COGNITION: Overall cognitive status: Within functional limits for tasks assessed     SENSATION: WFL    MUSCLE LENGTH: Hamstrings: Right wfl deg; Left wfl deg Debby test: Right nt deg; Left  nt deg  POSTURE: marked valgum B knees, greater on R  PALPATION: Tender L lateral hip musculature, TFL Tender R glut medius, minimus,  LOWER EXTREMITY ROM:  Passive ROM Right eval Left eval Left  12/18/23 PROM  Hip flexion 105 95   Hip extension     Hip abduction     Hip adduction     Hip internal rotation     Hip external rotation     Knee flexion 112 102 107  Knee extension -3 -12  Lacking 3  Ankle dorsiflexion     Ankle plantarflexion     Ankle inversion     Ankle eversion      (Blank rows = not tested)  LOWER EXTREMITY MMT:  MMT Right eval Left eval Right 11/24/23 Left 11/24/23 R / L 12/29/23  Hip flexion 4- 4- 4 4 5  Hip extension       Hip abduction 3+ 3- 4 4 5   Hip adduction       Hip internal rotation       Hip external rotation       Knee flexion       Knee extension       Ankle dorsiflexion       Ankle plantarflexion 4- 4-     Ankle inversion       Ankle eversion        (Blank rows = not tested)   FUNCTIONAL TESTS:  Timed up and go (TUG): 18.35 11/24/23:  TUG 15s  12/29/23: 13.82  GAIT: Distance walked: 20' in clinic, no device, labored gait, slowed cadence decreased stride length B                                                                                                                              TREATMENT DATE:  [facility] Adult PT Treatment:                                             Date:   12/31/23: NuStep L5 x 5 min  Step Ups 4 2x8 HS Curls 15# 2x10 Leg ext 10# 2x10 STS w/ OHP yellow med ball 2x10 Stability ball rollouts   Assessment of new goals    12/29/2023 Pt seen for aquatic therapy today.  Treatment took place in water 3.5-4.75 ft in depth at the  [facility] pool. Temp of water was 91.  Pt entered/exited the pool via stairs independently with bil rail.  Exercises - walking forward/ backward with gentle arm swing   - Side Stepping with or without Hand floats: horizontal add/abd-> abd add - suitcase carry with single hand float at side (or on both sides), walking forward/ backward   - Leg Swings Side to Side - hold wall or hand floats    - Forward Backward Leg Swing - hold wall or hand floats   - Heel and toe raises , holding wall in pool or hand floats   - Standing Fold Over Stretch at [employer] and Backwards with Row Motion with Hand Floats   - Single Leg Clam - hold wall then hand floats  - Sit to Stand with forward arm reach (stick your bottom out and bend your knees) - slow and controlled motion   - Seated straddle on noodle, holding corner, bicycle legs       PATIENT EDUCATION:  Education details: exercise rationale, progression and modification  Person educated: Patient  Education method: Explanation, demonstration Education comprehension: verbalized understanding  HOME EXERCISE PROGRAM: Access Code: [email or web address] URL: [email or web address] Date: 11/24/2023 Prepared by: Almetta Fam  Exercises - Step Up  - 1 x daily - 7 x weekly - 2 sets - 10  reps - Lateral Step Up  - 1 x daily - 7 x weekly - 2 sets - 10 reps - Sit to Stand  - 1 x daily - 7 x weekly - 2 sets - 10 reps - Seated Long Arc Quad  - 1 x daily - 7 x weekly - 2 sets - 10 reps  AQUATIC Access Code: [email or web address] URL: [email or web address] Date: 12/16/2023 * not issued yet Prepared by: [facility] - [facility] - [address] [address] This aquatic home exercise program from [employer] utilizes pictures from land based exercises, but has been adapted prior to lamination and issuance.    ASSESSMENT:  CLINICAL IMPRESSION:  Pt was discharged from aquatic therapy and reported desire to continue PT in a land setting  to continue to progress LE strength and decrease BLE knee pain. New goals added and assessed today. Pt will benefit from resistance training and stretching to decrease joint pain and increase muscle endurance in order to improve ambulation quality. Pt re certified for 8 weeks for this outpatient facility.    OBJECTIVE IMPAIRMENTS: cardiopulmonary status limiting activity, decreased activity tolerance, decreased balance, decreased endurance, decreased knowledge of condition, decreased mobility, difficulty walking, decreased ROM, decreased strength, increased fascial restrictions, impaired perceived functional ability, postural dysfunction, and pain.   ACTIVITY LIMITATIONS: carrying, standing, squatting, stairs, transfers, and locomotion level  PARTICIPATION LIMITATIONS: cleaning, laundry, shopping, community activity, and yard work  PERSONAL FACTORS: Behavior pattern, Time since onset of injury/illness/exacerbation, and 1-2 comorbidities: COPD are also affecting patient's functional outcome.   REHAB POTENTIAL: Good  CLINICAL DECISION MAKING: Evolving/moderate complexity  EVALUATION COMPLEXITY: Moderate   GOALS: Goals reviewed with patient? Yes  SHORT TERM GOALS: Target date: 2 weeks, 10/22/23 I HEP Baseline: Goal status: just uses small pedal machine at home 11/24/23   LONG TERM GOALS: Target date: 01/01/24, 12 weeks, recert until 02/25/24  LEFSLower Extremity Functional Score: 35 / 80 = 43.8 % improve to 30%or less disability Baseline:  Goal status: 25/80 going 11/24/23; 27% Met 12/29/23  2.  TUG improve from 18 to 14 or less Baseline:  Goal status: 15s 11/24/23; Met 12/29/23  3.  Strength B hip abd, ext 5/5 Baseline: 3+ and 4-/5 Goal status: ongoing 11/24/23; Met 12/29/23  4. Pt will have decrease in bilateral knee pain by 50% from initial pain symptoms.  Baseline: 4/10 Goal Status: Initial       5. Pt will improve 5x STS time to <15s Baseline: 18.37 5xSTS  Goal Status:  Initial   PLAN:  PT FREQUENCY: 2x/week  PT DURATION: 12 weeks  PLANNED INTERVENTIONS: 97110-Therapeutic exercises, 97530- Therapeutic activity, [id number]- Neuromuscular re-education, 97535- Self Care, 02859- Manual therapy, [phone]- Aquatic Therapy, and Patient/Family education  PLAN FOR NEXT SESSION: progress balance and strengthening  Thersia Alder, Student-PT 12/31/2023 11:52 AM

## 2024-01-01 ENCOUNTER — Ambulatory Visit: Payer: Self-pay | Admitting: Obstetrics and Gynecology

## 2024-01-01 ENCOUNTER — Ambulatory Visit (HOSPITAL_BASED_OUTPATIENT_CLINIC_OR_DEPARTMENT_OTHER): Admitting: Physical Therapy

## 2024-01-02 ENCOUNTER — Other Ambulatory Visit: Payer: Self-pay

## 2024-01-06 ENCOUNTER — Other Ambulatory Visit: Payer: Self-pay

## 2024-01-07 ENCOUNTER — Ambulatory Visit (HOSPITAL_BASED_OUTPATIENT_CLINIC_OR_DEPARTMENT_OTHER): Payer: Self-pay | Admitting: Family

## 2024-01-07 DIAGNOSIS — I10 Essential (primary) hypertension: Secondary | ICD-10-CM

## 2024-01-07 DIAGNOSIS — E1122 Type 2 diabetes mellitus with diabetic chronic kidney disease: Secondary | ICD-10-CM

## 2024-01-07 DIAGNOSIS — R7989 Other specified abnormal findings of blood chemistry: Secondary | ICD-10-CM

## 2024-01-07 DIAGNOSIS — Z79899 Other long term (current) drug therapy: Secondary | ICD-10-CM

## 2024-01-07 LAB — BASIC METABOLIC PANEL WITH GFR
BUN/Creatinine Ratio: 17 (ref 12–28)
BUN: 25 mg/dL (ref 8–27)
CO2: 22 mmol/L (ref 20–29)
Calcium: 9.4 mg/dL (ref 8.7–10.3)
Chloride: 104 mmol/L (ref 96–106)
Creatinine, Ser: 1.5 mg/dL — ABNORMAL HIGH (ref 0.57–1.00)
Glucose: 75 mg/dL (ref 70–99)
Potassium: 4.8 mmol/L (ref 3.5–5.2)
Sodium: 141 mmol/L (ref 134–144)
eGFR: 37 mL/min/1.73 — ABNORMAL LOW

## 2024-01-12 ENCOUNTER — Other Ambulatory Visit: Payer: Self-pay

## 2024-01-19 ENCOUNTER — Other Ambulatory Visit: Payer: Self-pay | Admitting: Family Medicine

## 2024-01-19 ENCOUNTER — Encounter (HOSPITAL_COMMUNITY): Payer: Self-pay | Admitting: Pharmacist

## 2024-01-19 ENCOUNTER — Other Ambulatory Visit (HOSPITAL_COMMUNITY): Payer: Self-pay

## 2024-01-19 ENCOUNTER — Other Ambulatory Visit: Payer: Self-pay

## 2024-01-19 DIAGNOSIS — E669 Obesity, unspecified: Secondary | ICD-10-CM

## 2024-01-19 MED ORDER — LANCETS MISC
1.0000 | 0 refills | Status: AC
  Filled 2024-01-19: qty 102, 25d supply, fill #0

## 2024-01-19 MED ORDER — BLOOD GLUCOSE MONITORING SUPPL DEVI
1.0000 | PACK | 0 refills | Status: AC
  Filled 2024-01-19: qty 1, 1d supply, fill #0

## 2024-01-19 MED ORDER — BLOOD GLUCOSE TEST VI STRP
1.0000 | ORAL_STRIP | 0 refills | Status: AC
  Filled 2024-01-19: qty 100, 25d supply, fill #0

## 2024-01-19 MED ORDER — LANCET DEVICE MISC
1.0000 | 0 refills | Status: AC
  Filled 2024-01-19: qty 1, 1d supply, fill #0
  Filled 2024-01-23: qty 1, 30d supply, fill #0

## 2024-01-20 ENCOUNTER — Ambulatory Visit: Admitting: Physical Therapy

## 2024-01-21 ENCOUNTER — Other Ambulatory Visit: Payer: Self-pay

## 2024-01-21 ENCOUNTER — Other Ambulatory Visit (HOSPITAL_COMMUNITY): Payer: Self-pay

## 2024-01-22 ENCOUNTER — Other Ambulatory Visit (HOSPITAL_COMMUNITY): Payer: Self-pay

## 2024-01-22 ENCOUNTER — Ambulatory Visit: Attending: Family Medicine | Admitting: Physical Therapy

## 2024-01-22 ENCOUNTER — Ambulatory Visit: Admitting: Physical Therapy

## 2024-01-22 DIAGNOSIS — M5459 Other low back pain: Secondary | ICD-10-CM | POA: Diagnosis present

## 2024-01-22 DIAGNOSIS — M25562 Pain in left knee: Secondary | ICD-10-CM | POA: Insufficient documentation

## 2024-01-22 DIAGNOSIS — G8929 Other chronic pain: Secondary | ICD-10-CM | POA: Insufficient documentation

## 2024-01-22 DIAGNOSIS — R293 Abnormal posture: Secondary | ICD-10-CM | POA: Insufficient documentation

## 2024-01-22 DIAGNOSIS — R262 Difficulty in walking, not elsewhere classified: Secondary | ICD-10-CM | POA: Diagnosis present

## 2024-01-22 DIAGNOSIS — M25561 Pain in right knee: Secondary | ICD-10-CM | POA: Insufficient documentation

## 2024-01-22 LAB — BASIC METABOLIC PANEL WITH GFR
BUN/Creatinine Ratio: 16 (ref 12–28)
BUN: 25 mg/dL (ref 8–27)
CO2: 22 mmol/L (ref 20–29)
Calcium: 9.7 mg/dL (ref 8.7–10.3)
Chloride: 100 mmol/L (ref 96–106)
Creatinine, Ser: 1.6 mg/dL — ABNORMAL HIGH (ref 0.57–1.00)
Glucose: 81 mg/dL (ref 70–99)
Potassium: 4.8 mmol/L (ref 3.5–5.2)
Sodium: 138 mmol/L (ref 134–144)
eGFR: 34 mL/min/1.73 — ABNORMAL LOW

## 2024-01-22 NOTE — Therapy (Signed)
 " OUTPATIENT PHYSICAL THERAPY LOWER EXTREMITY TREATMENT Progress Note Reporting Period 11/25/23 to 01/22/24  See note below for Objective Data and Assessment of Progress/Goals.        Patient Name: Rose Miller MRN: [id number] DOB:[birth year], 69 y.o., female Today's Date: 01/22/2024  END OF SESSION:  PT End of Session - 01/22/24 0919     Visit Number 20    Date for Recertification  02/25/24    PT Start Time 0925    PT Stop Time 1005    PT Time Calculation (min) 40 min          Past Medical History:  Diagnosis Date   Adhesive capsulitis    Aortic valve disease    Arthritis    Asthma    Back pain    CKD (chronic kidney disease) stage 3, GFR 30-59 ml/min (HCC)    Constipation    COPD (chronic obstructive pulmonary disease) (HCC)    Diabetes mellitus without complication (HCC) 10 20 2020   Diuretic-induced hypokalemia    Dyspnea    Fibroid    Heart murmur    Hypercholesterolemia    Hypertension    IT band syndrome    Morbid obesity (HCC)    Palpitations    Periumbilical pain    Pneumonia    Postmenopausal estrogen deficiency    Prediabetes    Preterm labor    S/P TAVR (transcatheter aortic valve replacement) 12/26/2020   Ventral hernia    Past Surgical History:  Procedure Laterality Date   BIOPSY  05/01/2021   Procedure: BIOPSY;  Surgeon: Charlanne Groom, MD;  Location: [facility];  Service: Gastroenterology;;   CARDIAC CATHETERIZATION  11/24/2020   CESAREAN SECTION  1989   COLONOSCOPY     COLONOSCOPY WITH PROPOFOL  N/A 05/01/2021   Procedure: COLONOSCOPY WITH PROPOFOL ;  Surgeon: Charlanne Groom, MD;  Location: [facility];  Service: Gastroenterology;  Laterality: N/A;   ESOPHAGOGASTRODUODENOSCOPY (EGD) WITH PROPOFOL  N/A 05/01/2021   Procedure: ESOPHAGOGASTRODUODENOSCOPY (EGD) WITH PROPOFOL ;  Surgeon: Charlanne Groom, MD;  Location: [facility];  Service: Gastroenterology;  Laterality: N/A;   INTRAOPERATIVE TRANSTHORACIC ECHOCARDIOGRAM N/A 12/26/2020    Procedure: INTRAOPERATIVE TRANSTHORACIC ECHOCARDIOGRAM;  Surgeon: Wendel Lurena POUR, MD;  Location: [facility];  Service: Open Heart Surgery;  Laterality: N/A;   MALONEY DILATION  05/01/2021   Procedure: AGAPITO DILATION;  Surgeon: Charlanne Groom, MD;  Location: [facility];  Service: Gastroenterology;;   POLYPECTOMY  05/01/2021   Procedure: POLYPECTOMY;  Surgeon: Charlanne Groom, MD;  Location: [facility];  Service: Gastroenterology;;   RIGHT/LEFT HEART CATH AND CORONARY ANGIOGRAPHY N/A 11/24/2020   Procedure: RIGHT/LEFT HEART CATH AND CORONARY ANGIOGRAPHY;  Surgeon: Claudene Victory ORN, MD;  Location: [facility];  Service: Cardiovascular;  Laterality: N/A;   TONSILLECTOMY AND ADENOIDECTOMY  1960   TRANSCATHETER AORTIC VALVE REPLACEMENT, TRANSFEMORAL N/A 12/26/2020   Procedure: TRANSCATHETER AORTIC VALVE REPLACEMENT, TRANSFEMORAL;  Surgeon: Wendel Lurena POUR, MD;  Location: [facility];  Service: Open Heart Surgery;  Laterality: N/A;   Patient Active Problem List   Diagnosis Date Noted   Acquired hammer toe of right foot 09/26/2023   Atherosclerosis of artery of both lower extremities 09/26/2023   Polyneuropathy due to type 2 diabetes mellitus (HCC) 09/26/2023   Primary localized osteoarthrosis of ankle and foot 09/26/2023   Kidney lesion 04/14/2023   Abscess of lower back 03/17/2023   Ingrown nail of great toe of left foot 12/16/2022   Vitamin D  deficiency 12/16/2022   Low back pain  12/16/2022   Chronic pain of both knees 12/16/2022   Dyspnea on exertion 12/16/2022   Coronary artery disease due to calcified coronary lesion 12/16/2022   Aortic atherosclerosis 12/16/2022   PAF (paroxysmal atrial fibrillation) (HCC) 03/14/2022   Otalgia of left ear 03/14/2022   Diplopia 09/12/2021   Allergic rhinitis 09/12/2021   Pharyngoesophageal dysphagia    Family history of colon cancer in father    Aortic valve disease 12/26/2020   COPD (chronic obstructive pulmonary disease)  (HCC) 12/26/2020   Postmenopausal estrogen deficiency 02/18/2019   Former smoker 05/13/2018   Long-term use of aspirin  therapy 05/13/2018   Peripheral vascular disease 05/05/2018   Prediabetes 05/05/2018   Type 2 diabetes mellitus in patient with obesity (HCC) 03/31/2017   CKD stage 3 secondary to diabetes (HCC) 03/31/2017   Hypercholesterolemia 06/28/2016   Aortic valve stenosis 03/29/2016   Periumbilical pain 03/29/2016   IT band syndrome 09/11/2014   Right shoulder pain 09/11/2014   Ventral hernia 05/01/2014   Morbid obesity (HCC) 03/17/2014    PCP: Sebastian Righter MD  REFERRING PROVIDER: same  REFERRING DIAG: lower back, B knee, L hip pain  THERAPY DIAG:  Difficulty in walking, not elsewhere classified  Other low back pain  Chronic pain of both knees  Rationale for Evaluation and Treatment: Rehabilitation  ONSET DATE: December 2024  SUBJECTIVE:   SUBJECTIVE STATEMENT: Knees are the same, back pretty good, RT shld bothering me  POOL ACCESS:  Pt is member of [employer].    PERTINENT HISTORY: Referred by PCP due to pain complaints knees, hips lower back PAIN:  Are you having pain? Yes: NPRS scale: 5/10 Pain location: knees BIL PRECAUTIONS: Fall  RED FLAGS: None   WEIGHT BEARING RESTRICTIONS: No  FALLS:  Has patient fallen in last 6 months? No  LIVING ENVIRONMENT: Lives with: lives with their son Lives in: House/apartment Stairs: steps outdoors Has following equipment at home: None  OCCUPATION: retired  PLOF: Independent  PATIENT GOALS: be able to walk without pain  NEXT MD VISIT: 1 month  OBJECTIVE:  Note: Objective measures were completed at Evaluation unless otherwise noted.  DIAGNOSTIC FINDINGS: na  PATIENT SURVEYS:  Lower Extremity Functional Score: 35 / 80 = 43.8 % 11/24/23: 25/80 =31 % 12/29/23: 22/80=27% COGNITION: Overall cognitive status: Within functional limits for tasks assessed     SENSATION: WFL    MUSCLE  LENGTH: Hamstrings: Right wfl deg; Left wfl deg Debby test: Right nt deg; Left  nt deg  POSTURE: marked valgum B knees, greater on R  PALPATION: Tender L lateral hip musculature, TFL Tender R glut medius, minimus,  LOWER EXTREMITY ROM:  Passive ROM Right eval Left eval Left  12/18/23 PROM  Hip flexion 105 95   Hip extension     Hip abduction     Hip adduction     Hip internal rotation     Hip external rotation     Knee flexion 112 102 107  Knee extension -3 -12  Lacking 3  Ankle dorsiflexion     Ankle plantarflexion     Ankle inversion     Ankle eversion      (Blank rows = not tested)  LOWER EXTREMITY MMT:  MMT Right eval Left eval Right 11/24/23 Left 11/24/23 R / L 12/29/23  Hip flexion 4- 4- 4 4 5   Hip extension       Hip abduction 3+ 3- 4 4 5   Hip adduction       Hip internal rotation  Hip external rotation       Knee flexion       Knee extension       Ankle dorsiflexion       Ankle plantarflexion 4- 4-     Ankle inversion       Ankle eversion        (Blank rows = not tested)   FUNCTIONAL TESTS:  Timed up and go (TUG): 18.35 11/24/23:  TUG 15s  12/29/23: 13.82  GAIT: Distance walked: 84' in clinic, no device, labored gait, slowed cadence decreased stride length B                                                                                                                              TREATMENT DATE:  [facility] Adult PT Treatment:                                             Date:   01/22/23 Nustep L 5 Resisted gait 20# fwd and backward 5 x then 3 x laterally- som ehip pain and fatigue laterally STS 10 x without UE from slightly elevated mat ADD ball squeeze 2 sets 10 Green tband hip flex 2 sets 10 and clams 2 sets 10 HS Curls 20# 2x10 Leg ext 10# 2x10 4 inch step ups fwd and laterally 10 x each way with UE support   12/31/23: NuStep L5 x 5 min  Step Ups 4 2x8 HS Curls 15# 2x10 Leg ext 10# 2x10 STS w/ OHP yellow med ball  2x10 Stability ball rollouts   Assessment of new goals    12/29/2023 Pt seen for aquatic therapy today.  Treatment took place in water 3.5-4.75 ft in depth at the [facility] pool. Temp of water was 91.  Pt entered/exited the pool via stairs independently with bil rail.  Exercises - walking forward/ backward with gentle arm swing   - Side Stepping with or without Hand floats: horizontal add/abd-> abd add - suitcase carry with single hand float at side (or on both sides), walking forward/ backward   - Leg Swings Side to Side - hold wall or hand floats    - Forward Backward Leg Swing - hold wall or hand floats   - Heel and toe raises , holding wall in pool or hand floats   - Standing Fold Over Stretch at [employer] and Backwards with Row Motion with Hand Floats   - Single Leg Clam - hold wall then hand floats  - Sit to Stand with forward arm reach (stick your bottom out and bend your knees) - slow and controlled motion   - Seated straddle on noodle, holding corner, bicycle legs       PATIENT EDUCATION:  Education details: exercise rationale, progression and modification  Person educated: Patient  Education  method: Explanation, demonstration Education comprehension: verbalized understanding  HOME EXERCISE PROGRAM: Access Code: [email or web address] URL: [email or web address] Date: 11/24/2023 Prepared by: Almetta Fam  Exercises - Step Up  - 1 x daily - 7 x weekly - 2 sets - 10 reps - Lateral Step Up  - 1 x daily - 7 x weekly - 2 sets - 10 reps - Sit to Stand  - 1 x daily - 7 x weekly - 2 sets - 10 reps - Seated Long Arc Quad  - 1 x daily - 7 x weekly - 2 sets - 10 reps  AQUATIC Access Code: [email or web address] URL: [email or web address] Date: 12/16/2023 * not issued yet Prepared by: [facility] [address] This aquatic home exercise program from Med[employer] utilizes pictures from land based exercises, but has been adapted  prior to lamination and issuance.    ASSESSMENT:  CLINICAL IMPRESSION:   Pt returns after several weeks form renewal- [facility].Knees are the same, back pretty good, RT shld bothering me. Progressed ex for ROM and strength with cuing for tech. Goals assessed    Pt was discharged from aquatic therapy and reported desire to continue PT in a land setting to continue to progress LE strength and decrease BLE knee pain. New goals added and assessed today. Pt will benefit from resistance training and stretching to decrease joint pain and increase muscle endurance in order to improve ambulation quality. Pt re certified for 8 weeks for this outpatient facility.    OBJECTIVE IMPAIRMENTS: cardiopulmonary status limiting activity, decreased activity tolerance, decreased balance, decreased endurance, decreased knowledge of condition, decreased mobility, difficulty walking, decreased ROM, decreased strength, increased fascial restrictions, impaired perceived functional ability, postural dysfunction, and pain.   ACTIVITY LIMITATIONS: carrying, standing, squatting, stairs, transfers, and locomotion level  PARTICIPATION LIMITATIONS: cleaning, laundry, shopping, community activity, and yard work  PERSONAL FACTORS: Behavior pattern, Time since onset of injury/illness/exacerbation, and 1-2 comorbidities: COPD are also affecting patient's functional outcome.   REHAB POTENTIAL: Good  CLINICAL DECISION MAKING: Evolving/moderate complexity  EVALUATION COMPLEXITY: Moderate   GOALS: Goals reviewed with patient? Yes  SHORT TERM GOALS: Target date: 2 weeks, 10/22/23 I HEP Baseline: Goal status: just uses small pedal machine at home 11/24/23   LONG TERM GOALS: Target date: 01/01/24, 12 weeks, recert until 02/25/24  LEFSLower Extremity Functional Score: 35 / 80 = 43.8 % improve to 30%or less disability Baseline:  Goal status: 25/80 going 11/24/23; 27% Met  12/29/23  2.  TUG improve from 18 to 14 or less Baseline:  Goal status: 15s 11/24/23; Met 12/29/23  3.  Strength B hip abd, ext 5/5 Baseline: 3+ and 4-/5 Goal status: ongoing 11/24/23; Met 12/29/23  4. Pt will have decrease in bilateral knee pain by 50% from initial pain symptoms.  Baseline: 4/10 Goal Status: 01/22/24 progressing      5. Pt will improve 5x STS time to <15s Baseline: 18.37 5xSTS  Goal Status: 01/22/24 progressing 18 sec  PLAN:  PT FREQUENCY: 2x/week  PT DURATION: 12 weeks  PLANNED INTERVENTIONS: 97110-Therapeutic exercises, 97530- Therapeutic activity, 97112- Neuromuscular re-education, 97535- Self Care, [id number]- Manual therapy, [phone]- Aquatic Therapy, and Patient/Family education  PLAN FOR NEXT SESSION: progress balance and strengthening  Jon Devinn Hurwitz PTA 01/22/2024 9:49 AM   "

## 2024-01-23 ENCOUNTER — Other Ambulatory Visit: Payer: Self-pay

## 2024-01-23 NOTE — Telephone Encounter (Signed)
-----   Message from Reche Finder, NP sent at 01/23/2024  1:26 PM EST ----- Slight elevation in creatinine compared to previous.  This is common after transitioning from losartan  to olmesartan .  Recommend staying well-hydrated.  Recommend repeat BMP in 2 weeks for monitoring  Recent BP readings requested via MyChart

## 2024-01-23 NOTE — Telephone Encounter (Signed)
 The patient has been notified of the result and verbalized understanding.  All questions (if any) were answered.  Pt aware to increase her po water intake and come in for repeat bmet in 2 weeks.   Pt provided me with a few days worth of BP readings that were taken first thing in the morning before medication administration.    She has only been checking them before meds, but will start checking her pressure an hour after medication for the next 3 days and call/mychart those readings to Reche Finder, NP thereafter to review.  Below are the following BP readings prior to meds:  1/5--121/69  1/6--123/68  1/7--118/68  1/8--116/69  1/9--120/71  Pt aware I will route these readings to Reche Finder, NP for further review and to make her aware of this plan.  Pt verbalized understanding and agrees with this plan.

## 2024-01-27 ENCOUNTER — Other Ambulatory Visit (HOSPITAL_COMMUNITY): Payer: Self-pay

## 2024-01-27 ENCOUNTER — Ambulatory Visit: Admitting: Physical Therapy

## 2024-01-27 DIAGNOSIS — R262 Difficulty in walking, not elsewhere classified: Secondary | ICD-10-CM | POA: Diagnosis not present

## 2024-01-27 DIAGNOSIS — M5459 Other low back pain: Secondary | ICD-10-CM

## 2024-01-27 DIAGNOSIS — G8929 Other chronic pain: Secondary | ICD-10-CM

## 2024-01-27 NOTE — Therapy (Signed)
 " OUTPATIENT PHYSICAL THERAPY LOWER EXTREMITY TREATMENT      Patient Name: Rose Miller MRN: [id number] DOB:1955-01-15, 69 y.o., female Today's Date: 01/27/2024  END OF SESSION:  PT End of Session - 01/27/24 1100     Visit Number 21    Date for Recertification  02/25/24    PT Start Time 1100    PT Stop Time 1145    PT Time Calculation (min) 45 min          Past Medical History:  Diagnosis Date   Adhesive capsulitis    Aortic valve disease    Arthritis    Asthma    Back pain    CKD (chronic kidney disease) stage 3, GFR 30-59 ml/min (HCC)    Constipation    COPD (chronic obstructive pulmonary disease) (HCC)    Diabetes mellitus without complication (HCC) 10 20 2020   Diuretic-induced hypokalemia    Dyspnea    Fibroid    Heart murmur    Hypercholesterolemia    Hypertension    IT band syndrome    Morbid obesity (HCC)    Palpitations    Periumbilical pain    Pneumonia    Postmenopausal estrogen deficiency    Prediabetes    Preterm labor    S/P TAVR (transcatheter aortic valve replacement) 12/26/2020   Ventral hernia    Past Surgical History:  Procedure Laterality Date   BIOPSY  05/01/2021   Procedure: BIOPSY;  Surgeon: Charlanne Groom, MD;  Location: [facility];  Service: Gastroenterology;;   CARDIAC CATHETERIZATION  11/24/2020   CESAREAN SECTION  1989   COLONOSCOPY     COLONOSCOPY WITH PROPOFOL  N/A 05/01/2021   Procedure: COLONOSCOPY WITH PROPOFOL ;  Surgeon: Charlanne Groom, MD;  Location: [facility];  Service: Gastroenterology;  Laterality: N/A;   ESOPHAGOGASTRODUODENOSCOPY (EGD) WITH PROPOFOL  N/A 05/01/2021   Procedure: ESOPHAGOGASTRODUODENOSCOPY (EGD) WITH PROPOFOL ;  Surgeon: Charlanne Groom, MD;  Location: [facility];  Service: Gastroenterology;  Laterality: N/A;   INTRAOPERATIVE TRANSTHORACIC ECHOCARDIOGRAM N/A 12/26/2020   Procedure: INTRAOPERATIVE TRANSTHORACIC ECHOCARDIOGRAM;  Surgeon: Wendel Lurena POUR, MD;  Location: [facility];  Service:  Open Heart Surgery;  Laterality: N/A;   MALONEY DILATION  05/01/2021   Procedure: AGAPITO DILATION;  Surgeon: Charlanne Groom, MD;  Location: [facility];  Service: Gastroenterology;;   POLYPECTOMY  05/01/2021   Procedure: POLYPECTOMY;  Surgeon: Charlanne Groom, MD;  Location: [facility];  Service: Gastroenterology;;   RIGHT/LEFT HEART CATH AND CORONARY ANGIOGRAPHY N/A 11/24/2020   Procedure: RIGHT/LEFT HEART CATH AND CORONARY ANGIOGRAPHY;  Surgeon: Claudene Victory ORN, MD;  Location: [facility];  Service: Cardiovascular;  Laterality: N/A;   TONSILLECTOMY AND ADENOIDECTOMY  1960   TRANSCATHETER AORTIC VALVE REPLACEMENT, TRANSFEMORAL N/A 12/26/2020   Procedure: TRANSCATHETER AORTIC VALVE REPLACEMENT, TRANSFEMORAL;  Surgeon: Wendel Lurena POUR, MD;  Location: [facility];  Service: Open Heart Surgery;  Laterality: N/A;   Patient Active Problem List   Diagnosis Date Noted   Acquired hammer toe of right foot 09/26/2023   Atherosclerosis of artery of both lower extremities 09/26/2023   Polyneuropathy due to type 2 diabetes mellitus (HCC) 09/26/2023   Primary localized osteoarthrosis of ankle and foot 09/26/2023   Kidney lesion 04/14/2023   Abscess of lower back 03/17/2023   Ingrown nail of great toe of left foot 12/16/2022   Vitamin D  deficiency 12/16/2022   Low back pain 12/16/2022   Chronic pain of both knees 12/16/2022   Dyspnea on exertion 12/16/2022   Coronary artery disease  due to calcified coronary lesion 12/16/2022   Aortic atherosclerosis 12/16/2022   PAF (paroxysmal atrial fibrillation) (HCC) 03/14/2022   Otalgia of left ear 03/14/2022   Diplopia 09/12/2021   Allergic rhinitis 09/12/2021   Pharyngoesophageal dysphagia    Family history of colon cancer in father    Aortic valve disease 12/26/2020   COPD (chronic obstructive pulmonary disease) (HCC) 12/26/2020   Postmenopausal estrogen deficiency 02/18/2019   Former smoker 05/13/2018   Long-term use of aspirin  therapy  05/13/2018   Peripheral vascular disease 05/05/2018   Prediabetes 05/05/2018   Type 2 diabetes mellitus in patient with obesity (HCC) 03/31/2017   CKD stage 3 secondary to diabetes (HCC) 03/31/2017   Hypercholesterolemia 06/28/2016   Aortic valve stenosis 03/29/2016   Periumbilical pain 03/29/2016   IT band syndrome 09/11/2014   Right shoulder pain 09/11/2014   Ventral hernia 05/01/2014   Morbid obesity (HCC) 03/17/2014    PCP: Sebastian Righter MD  REFERRING PROVIDER: same  REFERRING DIAG: lower back, B knee, L hip pain  THERAPY DIAG:  Difficulty in walking, not elsewhere classified  Other low back pain  Chronic pain of both knees  Rationale for Evaluation and Treatment: Rehabilitation  ONSET DATE: December 2024  SUBJECTIVE:   SUBJECTIVE STATEMENT: Alittle sire after last session. Not too bad now  POOL ACCESS:  Pt is member of [employer].    PERTINENT HISTORY: Referred by PCP due to pain complaints knees, hips lower back PAIN:  Are you having pain? Yes: NPRS scale: 5/10 Pain location: knees BIL PRECAUTIONS: Fall  RED FLAGS: None   WEIGHT BEARING RESTRICTIONS: No  FALLS:  Has patient fallen in last 6 months? No  LIVING ENVIRONMENT: Lives with: lives with their son Lives in: House/apartment Stairs: steps outdoors Has following equipment at home: None  OCCUPATION: retired  PLOF: Independent  PATIENT GOALS: be able to walk without pain  NEXT MD VISIT: 1 month  OBJECTIVE:  Note: Objective measures were completed at Evaluation unless otherwise noted.  DIAGNOSTIC FINDINGS: na  PATIENT SURVEYS:  Lower Extremity Functional Score: 35 / 80 = 43.8 % 11/24/23: 25/80 =31 % 12/29/23: 22/80=27% COGNITION: Overall cognitive status: Within functional limits for tasks assessed     SENSATION: WFL    MUSCLE LENGTH: Hamstrings: Right wfl deg; Left wfl deg Debby test: Right nt deg; Left  nt deg  POSTURE: marked valgum B knees, greater on  R  PALPATION: Tender L lateral hip musculature, TFL Tender R glut medius, minimus,  LOWER EXTREMITY ROM:  Passive ROM Right eval Left eval Left  12/18/23 PROM  Hip flexion 105 95   Hip extension     Hip abduction     Hip adduction     Hip internal rotation     Hip external rotation     Knee flexion 112 102 107  Knee extension -3 -12  Lacking 3  Ankle dorsiflexion     Ankle plantarflexion     Ankle inversion     Ankle eversion      (Blank rows = not tested)  LOWER EXTREMITY MMT:  MMT Right eval Left eval Right 11/24/23 Left 11/24/23 R / L 12/29/23  Hip flexion 4- 4- 4 4 5   Hip extension       Hip abduction 3+ 3- 4 4 5   Hip adduction       Hip internal rotation       Hip external rotation       Knee flexion  Knee extension       Ankle dorsiflexion       Ankle plantarflexion 4- 4-     Ankle inversion       Ankle eversion        (Blank rows = not tested)   FUNCTIONAL TESTS:  Timed up and go (TUG): 18.35 11/24/23:  TUG 15s  12/29/23: 13.82  GAIT: Distance walked: 73' in clinic, no device, labored gait, slowed cadence decreased stride length B                                                                                                                              TREATMENT DATE:  [facility] Adult PT Treatment:                                             Date:   01/27/24 STS 10 x ADD ball squeeze 20 x Green tband hip flex 2 sets 10 and clams 2 sets 10 Nustep L 5 Red tband standing SL hip flex,ext and abd 10x- struggled and increased pain Resisted gait 20# fwd and backward 5 x then 3 x laterally HS Curls 20# 2x10 Leg ext 10# 2x10 Black bar heel raise and toe raises 2 sets 10  01/22/23 Nustep L 5 Resisted gait 20# fwd and backward 5 x then 3 x laterally- some hip pain and fatigue laterally STS 10 x without UE from slightly elevated mat ADD ball squeeze 2 sets 10 Green tband hip flex 2 sets 10 and clams 2 sets 10 HS Curls 20# 2x10 Leg ext  10# 2x10 4 inch step ups fwd and laterally 10 x each way with UE support   12/31/23: NuStep L5 x 5 min  Step Ups 4 2x8 HS Curls 15# 2x10 Leg ext 10# 2x10 STS w/ OHP yellow med ball 2x10 Stability ball rollouts   Assessment of new goals    12/29/2023 Pt seen for aquatic therapy today.  Treatment took place in water 3.5-4.75 ft in depth at the [facility] pool. Temp of water was 91.  Pt entered/exited the pool via stairs independently with bil rail.  Exercises - walking forward/ backward with gentle arm swing   - Side Stepping with or without Hand floats: horizontal add/abd-> abd add - suitcase carry with single hand float at side (or on both sides), walking forward/ backward   - Leg Swings Side to Side - hold wall or hand floats    - Forward Backward Leg Swing - hold wall or hand floats   - Heel and toe raises , holding wall in pool or hand floats   - Standing Fold Over Stretch at [employer] and Backwards with Row Motion with Hand Floats   - Single Leg Clam - hold wall then hand floats  - Sit  to Stand with forward arm reach (stick your bottom out and bend your knees) - slow and controlled motion   - Seated straddle on noodle, holding corner, bicycle legs       PATIENT EDUCATION:  Education details: exercise rationale, progression and modification  Person educated: Patient  Education method: Explanation, demonstration Education comprehension: verbalized understanding  HOME EXERCISE PROGRAM: Access Code: [email or web address] URL: [email or web address] Date: 11/24/2023 Prepared by: Almetta Fam  Exercises - Step Up  - 1 x daily - 7 x weekly - 2 sets - 10 reps - Lateral Step Up  - 1 x daily - 7 x weekly - 2 sets - 10 reps - Sit to Stand  - 1 x daily - 7 x weekly - 2 sets - 10 reps - Seated Long Arc Quad  - 1 x daily - 7 x weekly - 2 sets - 10 reps  AQUATIC Access Code: [email or web address] URL: [email or web address] Date: 12/16/2023 *  not issued yet Prepared by: [facility] [address] This aquatic home exercise program from Med[employer] utilizes pictures from land based exercises, but has been adapted prior to lamination and issuance.    ASSESSMENT:  CLINICAL IMPRESSION:  Increased BIL hip pain RT > Left with tband kicks and resisted gait.alittle more pain today vs last sesison but did well. C/o soreness after last session but okay after a day or so. Will continue to progress strength and monitor pain    Pt was discharged from aquatic therapy and reported desire to continue PT in a land setting to continue to progress LE strength and decrease BLE knee pain. New goals added and assessed today. Pt will benefit from resistance training and stretching to decrease joint pain and increase muscle endurance in order to improve ambulation quality. Pt re certified for 8 weeks for this outpatient facility.    OBJECTIVE IMPAIRMENTS: cardiopulmonary status limiting activity, decreased activity tolerance, decreased balance, decreased endurance, decreased knowledge of condition, decreased mobility, difficulty walking, decreased ROM, decreased strength, increased fascial restrictions, impaired perceived functional ability, postural dysfunction, and pain.   ACTIVITY LIMITATIONS: carrying, standing, squatting, stairs, transfers, and locomotion level  PARTICIPATION LIMITATIONS: cleaning, laundry, shopping, community activity, and yard work  PERSONAL FACTORS: Behavior pattern, Time since onset of injury/illness/exacerbation, and 1-2 comorbidities: COPD are also affecting patient's functional outcome.   REHAB POTENTIAL: Good  CLINICAL DECISION MAKING: Evolving/moderate complexity  EVALUATION COMPLEXITY: Moderate   GOALS: Goals reviewed with patient? Yes  SHORT TERM GOALS: Target date: 2 weeks, 10/22/23 I HEP Baseline: Goal status: just uses small pedal machine at home 11/24/23   LONG TERM GOALS: Target date:  01/01/24, 12 weeks, recert until 02/25/24  LEFSLower Extremity Functional Score: 35 / 80 = 43.8 % improve to 30%or less disability Baseline:  Goal status: 25/80 going 11/24/23; 27% Met 12/29/23  2.  TUG improve from 18 to 14 or less Baseline:  Goal status: 15s 11/24/23; Met 12/29/23  3.  Strength B hip abd, ext 5/5 Baseline: 3+ and 4-/5 Goal status: ongoing 11/24/23; Met 12/29/23  4. Pt will have decrease in bilateral knee pain by 50% from initial pain symptoms.  Baseline: 4/10 Goal Status: 01/22/24 progressing      5. Pt will improve 5x STS time to <15s Baseline: 18.37 5xSTS  Goal Status: 01/22/24 progressing 18 sec  PLAN:  PT FREQUENCY: 2x/week  PT DURATION: 12 weeks  PLANNED INTERVENTIONS: 97110-Therapeutic exercises, 97530- Therapeutic activity, 97112- Neuromuscular re-education, 97535- Self Care, [id number]- Manual therapy, [id number]- Aquatic Therapy, and Patient/Family  education  PLAN FOR NEXT SESSION: progress balance and strengthening  Jon Day Greb PTA 01/27/2024 11:01 AM   "

## 2024-01-28 NOTE — Therapy (Signed)
 " OUTPATIENT PHYSICAL THERAPY LOWER EXTREMITY TREATMENT      Patient Name: Rose Miller MRN: [id number] DOB:03/11/1955, 69 y.o., female Today's Date: 01/29/2024  END OF SESSION:  PT End of Session - 01/29/24 1055     Visit Number 22    Date for Recertification  02/25/24    PT Start Time 1055    PT Stop Time 1140    PT Time Calculation (min) 45 min           Past Medical History:  Diagnosis Date   Adhesive capsulitis    Aortic valve disease    Arthritis    Asthma    Back pain    CKD (chronic kidney disease) stage 3, GFR 30-59 ml/min (HCC)    Constipation    COPD (chronic obstructive pulmonary disease) (HCC)    Diabetes mellitus without complication (HCC) 10 20 2020   Diuretic-induced hypokalemia    Dyspnea    Fibroid    Heart murmur    Hypercholesterolemia    Hypertension    IT band syndrome    Morbid obesity (HCC)    Palpitations    Periumbilical pain    Pneumonia    Postmenopausal estrogen deficiency    Prediabetes    Preterm labor    S/P TAVR (transcatheter aortic valve replacement) 12/26/2020   Ventral hernia    Past Surgical History:  Procedure Laterality Date   BIOPSY  05/01/2021   Procedure: BIOPSY;  Surgeon: Charlanne Groom, MD;  Location: [facility];  Service: Gastroenterology;;   CARDIAC CATHETERIZATION  11/24/2020   CESAREAN SECTION  1989   COLONOSCOPY     COLONOSCOPY WITH PROPOFOL  N/A 05/01/2021   Procedure: COLONOSCOPY WITH PROPOFOL ;  Surgeon: Charlanne Groom, MD;  Location: [facility];  Service: Gastroenterology;  Laterality: N/A;   ESOPHAGOGASTRODUODENOSCOPY (EGD) WITH PROPOFOL  N/A 05/01/2021   Procedure: ESOPHAGOGASTRODUODENOSCOPY (EGD) WITH PROPOFOL ;  Surgeon: Charlanne Groom, MD;  Location: [facility];  Service: Gastroenterology;  Laterality: N/A;   INTRAOPERATIVE TRANSTHORACIC ECHOCARDIOGRAM N/A 12/26/2020   Procedure: INTRAOPERATIVE TRANSTHORACIC ECHOCARDIOGRAM;  Surgeon: Wendel Lurena POUR, MD;  Location: [facility];  Service:  Open Heart Surgery;  Laterality: N/A;   MALONEY DILATION  05/01/2021   Procedure: AGAPITO DILATION;  Surgeon: Charlanne Groom, MD;  Location: [facility];  Service: Gastroenterology;;   POLYPECTOMY  05/01/2021   Procedure: POLYPECTOMY;  Surgeon: Charlanne Groom, MD;  Location: [facility];  Service: Gastroenterology;;   RIGHT/LEFT HEART CATH AND CORONARY ANGIOGRAPHY N/A 11/24/2020   Procedure: RIGHT/LEFT HEART CATH AND CORONARY ANGIOGRAPHY;  Surgeon: Claudene Victory ORN, MD;  Location: [facility];  Service: Cardiovascular;  Laterality: N/A;   TONSILLECTOMY AND ADENOIDECTOMY  1960   TRANSCATHETER AORTIC VALVE REPLACEMENT, TRANSFEMORAL N/A 12/26/2020   Procedure: TRANSCATHETER AORTIC VALVE REPLACEMENT, TRANSFEMORAL;  Surgeon: Wendel Lurena POUR, MD;  Location: [facility];  Service: Open Heart Surgery;  Laterality: N/A;   Patient Active Problem List   Diagnosis Date Noted   Acquired hammer toe of right foot 09/26/2023   Atherosclerosis of artery of both lower extremities 09/26/2023   Polyneuropathy due to type 2 diabetes mellitus (HCC) 09/26/2023   Primary localized osteoarthrosis of ankle and foot 09/26/2023   Kidney lesion 04/14/2023   Abscess of lower back 03/17/2023   Ingrown nail of great toe of left foot 12/16/2022   Vitamin D  deficiency 12/16/2022   Low back pain 12/16/2022   Chronic pain of both knees 12/16/2022   Dyspnea on exertion 12/16/2022   Coronary artery  disease due to calcified coronary lesion 12/16/2022   Aortic atherosclerosis 12/16/2022   PAF (paroxysmal atrial fibrillation) (HCC) 03/14/2022   Otalgia of left ear 03/14/2022   Diplopia 09/12/2021   Allergic rhinitis 09/12/2021   Pharyngoesophageal dysphagia    Family history of colon cancer in father    Aortic valve disease 12/26/2020   COPD (chronic obstructive pulmonary disease) (HCC) 12/26/2020   Postmenopausal estrogen deficiency 02/18/2019   Former smoker 05/13/2018   Long-term use of aspirin  therapy  05/13/2018   Peripheral vascular disease 05/05/2018   Prediabetes 05/05/2018   Type 2 diabetes mellitus in patient with obesity (HCC) 03/31/2017   CKD stage 3 secondary to diabetes (HCC) 03/31/2017   Hypercholesterolemia 06/28/2016   Aortic valve stenosis 03/29/2016   Periumbilical pain 03/29/2016   IT band syndrome 09/11/2014   Right shoulder pain 09/11/2014   Ventral hernia 05/01/2014   Morbid obesity (HCC) 03/17/2014    PCP: Sebastian Righter MD  REFERRING PROVIDER: same  REFERRING DIAG: lower back, B knee, L hip pain  THERAPY DIAG:  Chronic pain of both knees  Difficulty in walking, not elsewhere classified  Rationale for Evaluation and Treatment: Rehabilitation  ONSET DATE: December 2024  SUBJECTIVE:   SUBJECTIVE STATEMENT: I am okay, the R knee is bothering me. It buckled on me when I was leaving the house this morning.   POOL ACCESS:  Pt is member of [employer].    PERTINENT HISTORY: Referred by PCP due to pain complaints knees, hips lower back PAIN:  Are you having pain? Yes: NPRS scale: 5/10 Pain location: knees BIL PRECAUTIONS: Fall  RED FLAGS: None   WEIGHT BEARING RESTRICTIONS: No  FALLS:  Has patient fallen in last 6 months? No  LIVING ENVIRONMENT: Lives with: lives with their son Lives in: House/apartment Stairs: steps outdoors Has following equipment at home: None  OCCUPATION: retired  PLOF: Independent  PATIENT GOALS: be able to walk without pain  NEXT MD VISIT: 1 month  OBJECTIVE:  Note: Objective measures were completed at Evaluation unless otherwise noted.  DIAGNOSTIC FINDINGS: na  PATIENT SURVEYS:  Lower Extremity Functional Score: 35 / 80 = 43.8 % 11/24/23: 25/80 =31 % 12/29/23: 22/80=27% COGNITION: Overall cognitive status: Within functional limits for tasks assessed     SENSATION: WFL    MUSCLE LENGTH: Hamstrings: Right wfl deg; Left wfl deg Debby test: Right nt deg; Left  nt deg  POSTURE: marked valgum B knees,  greater on R  PALPATION: Tender L lateral hip musculature, TFL Tender R glut medius, minimus,  LOWER EXTREMITY ROM:  Passive ROM Right eval Left eval Left  12/18/23 PROM  Hip flexion 105 95   Hip extension     Hip abduction     Hip adduction     Hip internal rotation     Hip external rotation     Knee flexion 112 102 107  Knee extension -3 -12  Lacking 3  Ankle dorsiflexion     Ankle plantarflexion     Ankle inversion     Ankle eversion      (Blank rows = not tested)  LOWER EXTREMITY MMT:  MMT Right eval Left eval Right 11/24/23 Left 11/24/23 R / L 12/29/23  Hip flexion 4- 4- 4 4 5   Hip extension       Hip abduction 3+ 3- 4 4 5   Hip adduction       Hip internal rotation       Hip external rotation  Knee flexion       Knee extension       Ankle dorsiflexion       Ankle plantarflexion 4- 4-     Ankle inversion       Ankle eversion        (Blank rows = not tested)   FUNCTIONAL TESTS:  Timed up and go (TUG): 18.35 11/24/23:  TUG 15s  12/29/23: 13.82  GAIT: Distance walked: 64' in clinic, no device, labored gait, slowed cadence decreased stride length B                                                                                                                              TREATMENT DATE:  [facility] Adult PT Treatment:                                             Date:  01/29/24 Bike L2 x32mins Leg ext 10# 2x10 HS curls 25# 2x10  Calf stretch 20s x2 Calf raises 2x12 Step ups 4 forward and laterally  STS 2x10 Lateral band walks  Seated march with resistance    01/27/24 STS 10 x ADD ball squeeze 20 x Green tband hip flex 2 sets 10 and clams 2 sets 10 Nustep L 5 Red tband standing SL hip flex,ext and abd 10x- struggled and increased pain Resisted gait 20# fwd and backward 5 x then 3 x laterally HS Curls 20# 2x10 Leg ext 10# 2x10 Black bar heel raise and toe raises 2 sets 10  01/22/23 Nustep L 5 Resisted gait 20# fwd and backward 5  x then 3 x laterally- some hip pain and fatigue laterally STS 10 x without UE from slightly elevated mat ADD ball squeeze 2 sets 10 Green tband hip flex 2 sets 10 and clams 2 sets 10 HS Curls 20# 2x10 Leg ext 10# 2x10 4 inch step ups fwd and laterally 10 x each way with UE support   12/31/23: NuStep L5 x 5 min  Step Ups 4 2x8 HS Curls 15# 2x10 Leg ext 10# 2x10 STS w/ OHP yellow med ball 2x10 Stability ball rollouts   Assessment of new goals    12/29/2023 Pt seen for aquatic therapy today.  Treatment took place in water 3.5-4.75 ft in depth at the [facility] pool. Temp of water was 91.  Pt entered/exited the pool via stairs independently with bil rail.  Exercises - walking forward/ backward with gentle arm swing   - Side Stepping with or without Hand floats: horizontal add/abd-> abd add - suitcase carry with single hand float at side (or on both sides), walking forward/ backward   - Leg Swings Side to Side - hold wall or hand floats    - Forward Backward Leg Swing - hold wall or hand floats   - Heel and  toe raises , holding wall in pool or hand floats   - Standing Fold Over Stretch at [employer] and Backwards with Row Motion with Hand Floats   - Single Leg Clam - hold wall then hand floats  - Sit to Stand with forward arm reach (stick your bottom out and bend your knees) - slow and controlled motion   - Seated straddle on noodle, holding corner, bicycle legs       PATIENT EDUCATION:  Education details: exercise rationale, progression and modification  Person educated: Patient  Education method: Explanation, demonstration Education comprehension: verbalized understanding  HOME EXERCISE PROGRAM: Access Code: [email or web address] URL: [email or web address] Date: 11/24/2023 Prepared by: Almetta Fam  Exercises - Step Up  - 1 x daily - 7 x weekly - 2 sets - 10 reps - Lateral Step Up  - 1 x daily - 7 x weekly - 2 sets - 10 reps - Sit to  Stand  - 1 x daily - 7 x weekly - 2 sets - 10 reps - Seated Long Arc Quad  - 1 x daily - 7 x weekly - 2 sets - 10 reps  AQUATIC Access Code: [email or web address] URL: [email or web address] Date: 12/16/2023 * not issued yet Prepared by: [facility] [address] This aquatic home exercise program from Med[employer] utilizes pictures from land based exercises, but has been adapted prior to lamination and issuance.    ASSESSMENT:  CLINICAL IMPRESSION:  Patient reports some ongoing pain in R knee upon entering session. With step ups she has increased fatigue and has some B hip pain, more on the right than the left. She is very tight in calves as noted with stretch on slant. Patient feels that PT has been helping despite having some pain. She feels the pain has lessened. Admits to not doing much at home but will try to do more. Will continue to progress with strengthening and monitor pain.    Pt was discharged from aquatic therapy and reported desire to continue PT in a land setting to continue to progress LE strength and decrease BLE knee pain. New goals added and assessed today. Pt will benefit from resistance training and stretching to decrease joint pain and increase muscle endurance in order to improve ambulation quality. Pt re certified for 8 weeks for this outpatient facility.    OBJECTIVE IMPAIRMENTS: cardiopulmonary status limiting activity, decreased activity tolerance, decreased balance, decreased endurance, decreased knowledge of condition, decreased mobility, difficulty walking, decreased ROM, decreased strength, increased fascial restrictions, impaired perceived functional ability, postural dysfunction, and pain.   ACTIVITY LIMITATIONS: carrying, standing, squatting, stairs, transfers, and locomotion level  PARTICIPATION LIMITATIONS: cleaning, laundry, shopping, community activity, and yard work  PERSONAL FACTORS: Behavior pattern, Time since onset of  injury/illness/exacerbation, and 1-2 comorbidities: COPD are also affecting patient's functional outcome.   REHAB POTENTIAL: Good  CLINICAL DECISION MAKING: Evolving/moderate complexity  EVALUATION COMPLEXITY: Moderate   GOALS: Goals reviewed with patient? Yes  SHORT TERM GOALS: Target date: 2 weeks, 10/22/23 I HEP Baseline: Goal status: just uses small pedal machine at home 11/24/23   LONG TERM GOALS: Target date: 01/01/24, 12 weeks, recert until 02/25/24  LEFSLower Extremity Functional Score: 35 / 80 = 43.8 % improve to 30%or less disability Baseline:  Goal status: 25/80 going 11/24/23; 27% Met 12/29/23  2.  TUG improve from 18 to 14 or less Baseline:  Goal status: 15s 11/24/23; Met 12/29/23  3.  Strength B hip abd, ext 5/5 Baseline: 3+ and  4-/5 Goal status: ongoing 11/24/23; Met 12/29/23  4. Pt will have decrease in bilateral knee pain by 50% from initial pain symptoms.  Baseline: 4/10 Goal Status: 01/22/24 progressing      5. Pt will improve 5x STS time to <15s Baseline: 18.37 5xSTS  Goal Status: 01/22/24 progressing 18 sec  PLAN:  PT FREQUENCY: 2x/week  PT DURATION: 12 weeks  PLANNED INTERVENTIONS: 97110-Therapeutic exercises, 97530- Therapeutic activity, 97112- Neuromuscular re-education, 97535- Self Care, [id number]- Manual therapy, [phone]- Aquatic Therapy, and Patient/Family education  PLAN FOR NEXT SESSION: progress balance and strengthening  Almetta Fam, PT, DPT 01/29/24 11:36 AM   "

## 2024-01-29 ENCOUNTER — Ambulatory Visit

## 2024-01-29 DIAGNOSIS — G8929 Other chronic pain: Secondary | ICD-10-CM

## 2024-01-29 DIAGNOSIS — R262 Difficulty in walking, not elsewhere classified: Secondary | ICD-10-CM | POA: Diagnosis not present

## 2024-02-02 ENCOUNTER — Other Ambulatory Visit (HOSPITAL_COMMUNITY): Payer: Self-pay

## 2024-02-03 ENCOUNTER — Other Ambulatory Visit: Payer: Self-pay

## 2024-02-03 ENCOUNTER — Ambulatory Visit

## 2024-02-03 DIAGNOSIS — R262 Difficulty in walking, not elsewhere classified: Secondary | ICD-10-CM

## 2024-02-03 DIAGNOSIS — R293 Abnormal posture: Secondary | ICD-10-CM

## 2024-02-03 DIAGNOSIS — M5459 Other low back pain: Secondary | ICD-10-CM

## 2024-02-03 DIAGNOSIS — G8929 Other chronic pain: Secondary | ICD-10-CM

## 2024-02-03 NOTE — Therapy (Signed)
 " OUTPATIENT PHYSICAL THERAPY LOWER EXTREMITY TREATMENT     Patient Name: Rose Miller MRN: [id number] DOB:01-Jun-1955, 69 y.o., female Today's Date: 02/03/2024  END OF SESSION:  PT End of Session - 02/03/24 1634     Visit Number 22    Date for Recertification  02/25/24    Authorization Type [employer]    PT Start Time 1545    PT Stop Time 1625    PT Time Calculation (min) 40 min    Activity Tolerance Patient tolerated treatment well    Behavior During Therapy [facility] for tasks assessed/performed            Past Medical History:  Diagnosis Date   Adhesive capsulitis    Aortic valve disease    Arthritis    Asthma    Back pain    CKD (chronic kidney disease) stage 3, GFR 30-59 ml/min (HCC)    Constipation    COPD (chronic obstructive pulmonary disease) (HCC)    Diabetes mellitus without complication (HCC) 10 20 2020   Diuretic-induced hypokalemia    Dyspnea    Fibroid    Heart murmur    Hypercholesterolemia    Hypertension    IT band syndrome    Morbid obesity (HCC)    Palpitations    Periumbilical pain    Pneumonia    Postmenopausal estrogen deficiency    Prediabetes    Preterm labor    S/P TAVR (transcatheter aortic valve replacement) 12/26/2020   Ventral hernia    Past Surgical History:  Procedure Laterality Date   BIOPSY  05/01/2021   Procedure: BIOPSY;  Surgeon: Charlanne Groom, MD;  Location: [facility];  Service: Gastroenterology;;   CARDIAC CATHETERIZATION  11/24/2020   CESAREAN SECTION  1989   COLONOSCOPY     COLONOSCOPY WITH PROPOFOL  N/A 05/01/2021   Procedure: COLONOSCOPY WITH PROPOFOL ;  Surgeon: Charlanne Groom, MD;  Location: [facility];  Service: Gastroenterology;  Laterality: N/A;   ESOPHAGOGASTRODUODENOSCOPY (EGD) WITH PROPOFOL  N/A 05/01/2021   Procedure: ESOPHAGOGASTRODUODENOSCOPY (EGD) WITH PROPOFOL ;  Surgeon: Charlanne Groom, MD;  Location: [facility];  Service: Gastroenterology;  Laterality: N/A;   INTRAOPERATIVE TRANSTHORACIC  ECHOCARDIOGRAM N/A 12/26/2020   Procedure: INTRAOPERATIVE TRANSTHORACIC ECHOCARDIOGRAM;  Surgeon: Wendel Lurena POUR, MD;  Location: [facility];  Service: Open Heart Surgery;  Laterality: N/A;   MALONEY DILATION  05/01/2021   Procedure: AGAPITO DILATION;  Surgeon: Charlanne Groom, MD;  Location: [facility];  Service: Gastroenterology;;   POLYPECTOMY  05/01/2021   Procedure: POLYPECTOMY;  Surgeon: Charlanne Groom, MD;  Location: [facility];  Service: Gastroenterology;;   RIGHT/LEFT HEART CATH AND CORONARY ANGIOGRAPHY N/A 11/24/2020   Procedure: RIGHT/LEFT HEART CATH AND CORONARY ANGIOGRAPHY;  Surgeon: Claudene Victory ORN, MD;  Location: [facility] INV[facility] CV LAB;  Service: Cardiovascular;  Laterality: N/A;   TONSILLECTOMY AND ADENOIDECTOMY  1960   TRANSCATHETER AORTIC VALVE REPLACEMENT, TRANSFEMORAL N/A 12/26/2020   Procedure: TRANSCATHETER AORTIC VALVE REPLACEMENT, TRANSFEMORAL;  Surgeon: Wendel Lurena POUR, MD;  Location: [facility];  Service: Open Heart Surgery;  Laterality: N/A;   Patient Active Problem List   Diagnosis Date Noted   Acquired hammer toe of right foot 09/26/2023   Atherosclerosis of artery of both lower extremities 09/26/2023   Polyneuropathy due to type 2 diabetes mellitus (HCC) 09/26/2023   Primary localized osteoarthrosis of ankle and foot 09/26/2023   Kidney lesion 04/14/2023   Abscess of lower back 03/17/2023   Ingrown nail of great toe of left foot 12/16/2022   Vitamin D   deficiency 12/16/2022   Low back pain 12/16/2022   Chronic pain of both knees 12/16/2022   Dyspnea on exertion 12/16/2022   Coronary artery disease due to calcified coronary lesion 12/16/2022   Aortic atherosclerosis 12/16/2022   PAF (paroxysmal atrial fibrillation) (HCC) 03/14/2022   Otalgia of left ear 03/14/2022   Diplopia 09/12/2021   Allergic rhinitis 09/12/2021   Pharyngoesophageal dysphagia    Family history of colon cancer in father    Aortic valve disease 12/26/2020   COPD (chronic  obstructive pulmonary disease) (HCC) 12/26/2020   Postmenopausal estrogen deficiency 02/18/2019   Former smoker 05/13/2018   Long-term use of aspirin  therapy 05/13/2018   Peripheral vascular disease 05/05/2018   Prediabetes 05/05/2018   Type 2 diabetes mellitus in patient with obesity (HCC) 03/31/2017   CKD stage 3 secondary to diabetes (HCC) 03/31/2017   Hypercholesterolemia 06/28/2016   Aortic valve stenosis 03/29/2016   Periumbilical pain 03/29/2016   IT band syndrome 09/11/2014   Right shoulder pain 09/11/2014   Ventral hernia 05/01/2014   Morbid obesity (HCC) 03/17/2014    PCP: Sebastian Righter MD  REFERRING PROVIDER: same  REFERRING DIAG: lower back, B knee, L hip pain  THERAPY DIAG:  Chronic pain of both knees  Abnormal posture  Difficulty in walking, not elsewhere classified  Other low back pain  Rationale for Evaluation and Treatment: Rehabilitation  ONSET DATE: December 2024  SUBJECTIVE:   SUBJECTIVE STATEMENT:  Pt reported 4/10 knee pain today and 5/10 low back pain that seems to be stemming from the cervical neck/upper trap area bilaterally. Pt states overall she is feeling okay.   POOL ACCESS:  Pt is member of [employer].    PERTINENT HISTORY: Referred by PCP due to pain complaints knees, hips lower back PAIN:  Are you having pain? Yes: NPRS scale: 5/10 Pain location: knees BIL PRECAUTIONS: Fall  RED FLAGS: None   WEIGHT BEARING RESTRICTIONS: No  FALLS:  Has patient fallen in last 6 months? No  LIVING ENVIRONMENT: Lives with: lives with their son Lives in: House/apartment Stairs: steps outdoors Has following equipment at home: None  OCCUPATION: retired  PLOF: Independent  PATIENT GOALS: be able to walk without pain  NEXT MD VISIT: 1 month  OBJECTIVE:  Note: Objective measures were completed at Evaluation unless otherwise noted.  DIAGNOSTIC FINDINGS: na  PATIENT SURVEYS:  Lower Extremity Functional Score: 35 / 80 = 43.8 % 11/24/23:  25/80 =31 % 12/29/23: 22/80=27% COGNITION: Overall cognitive status: Within functional limits for tasks assessed     SENSATION: WFL    MUSCLE LENGTH: Hamstrings: Right wfl deg; Left wfl deg Debby test: Right nt deg; Left  nt deg  POSTURE: marked valgum B knees, greater on R  PALPATION: Tender L lateral hip musculature, TFL Tender R glut medius, minimus,  LOWER EXTREMITY ROM:  Passive ROM Right eval Left eval Left  12/18/23 PROM  Hip flexion 105 95   Hip extension     Hip abduction     Hip adduction     Hip internal rotation     Hip external rotation     Knee flexion 112 102 107  Knee extension -3 -12  Lacking 3  Ankle dorsiflexion     Ankle plantarflexion     Ankle inversion     Ankle eversion      (Blank rows = not tested)  LOWER EXTREMITY MMT:  MMT Right eval Left eval Right 11/24/23 Left 11/24/23 R / L 12/29/23  Hip flexion 4- 4- 4  4 5  Hip extension       Hip abduction 3+ 3- 4 4 5   Hip adduction       Hip internal rotation       Hip external rotation       Knee flexion       Knee extension       Ankle dorsiflexion       Ankle plantarflexion 4- 4-     Ankle inversion       Ankle eversion        (Blank rows = not tested)   FUNCTIONAL TESTS:  Timed up and go (TUG): 18.35 11/24/23:  TUG 15s  12/29/23: 13.82  GAIT: Distance walked: 65' in clinic, no device, labored gait, slowed cadence decreased stride length B                                                                                                                              TREATMENT DATE:  [facility] Adult PT Treatment:                                             Date:   02/03/24 NuStep L5 x 6 min Standing Hip Abduction 2x10 1.5#s [place] Press 2x10 15# Leg Press 2x10 40# SB Rollouts for low back  Knee to chest stretch  Lumbar spine rotations   01/29/24 Bike L2 x59mins Leg ext 10# 2x10 HS curls 25# 2x10  Calf stretch 20s x2 Calf raises 2x12 Step ups 4 forward and laterally   STS 2x10 Lateral band walks  Seated march with resistance   01/27/24 STS 10 x ADD ball squeeze 20 x Green tband hip flex 2 sets 10 and clams 2 sets 10 Nustep L 5 Red tband standing SL hip flex,ext and abd 10x- struggled and increased pain Resisted gait 20# fwd and backward 5 x then 3 x laterally HS Curls 20# 2x10 Leg ext 10# 2x10 Black bar heel raise and toe raises 2 sets 10  01/22/23 Nustep L 5 Resisted gait 20# fwd and backward 5 x then 3 x laterally- some hip pain and fatigue laterally STS 10 x without UE from slightly elevated mat ADD ball squeeze 2 sets 10 Green tband hip flex 2 sets 10 and clams 2 sets 10 HS Curls 20# 2x10 Leg ext 10# 2x10 4 inch step ups fwd and laterally 10 x each way with UE support   12/31/23: NuStep L5 x 5 min  Step Ups 4 2x8 HS Curls 15# 2x10 Leg ext 10# 2x10 STS w/ OHP yellow med ball 2x10 Stability ball rollouts   Assessment of new goals    12/29/2023 Pt seen for aquatic therapy today.  Treatment took place in water 3.5-4.75 ft in depth at the [facility] pool. Temp of water was 91.  Pt entered/exited the  pool via stairs independently with bil rail.  Exercises - walking forward/ backward with gentle arm swing   - Side Stepping with or without Hand floats: horizontal add/abd-> abd add - suitcase carry with single hand float at side (or on both sides), walking forward/ backward   - Leg Swings Side to Side - hold wall or hand floats    - Forward Backward Leg Swing - hold wall or hand floats   - Heel and toe raises , holding wall in pool or hand floats   - Standing Fold Over Stretch at [employer] and Backwards with Row Motion with Hand Floats   - Single Leg Clam - hold wall then hand floats  - Sit to Stand with forward arm reach (stick your bottom out and bend your knees) - slow and controlled motion   - Seated straddle on noodle, holding corner, bicycle legs       PATIENT EDUCATION:  Education  details: exercise rationale, progression and modification  Person educated: Patient  Education method: Explanation, demonstration Education comprehension: verbalized understanding  HOME EXERCISE PROGRAM: Access Code: [email or web address] URL: [email or web address] Date: 11/24/2023 Prepared by: Almetta Fam  Exercises - Step Up  - 1 x daily - 7 x weekly - 2 sets - 10 reps - Lateral Step Up  - 1 x daily - 7 x weekly - 2 sets - 10 reps - Sit to Stand  - 1 x daily - 7 x weekly - 2 sets - 10 reps - Seated Long Arc Quad  - 1 x daily - 7 x weekly - 2 sets - 10 reps  AQUATIC Access Code: [email or web address] URL: [email or web address] Date: 12/16/2023 * not issued yet Prepared by: [facility] - [address] This aquatic home exercise program from Med[employer] utilizes pictures from land based exercises, but has been adapted prior to lamination and issuance.    ASSESSMENT:  CLINICAL IMPRESSION:   Pt doing well with current regimen but still having some lower level pain symptoms; patient reports some ongoing pain today in the low back and knees. Pt exhibits decrease endurance with standing activity and requires rest periods. Pt also presents with decreased core strength. Pt will continue to progress with strengthening and monitor pain.     Pt was discharged from aquatic therapy and reported desire to continue PT in a land setting to continue to progress LE strength and decrease BLE knee pain. New goals added and assessed today. Pt will benefit from resistance training and stretching to decrease joint pain and increase muscle endurance in order to improve ambulation quality. Pt re certified for 8 weeks for this outpatient facility.    OBJECTIVE IMPAIRMENTS: cardiopulmonary status limiting activity, decreased activity tolerance, decreased balance, decreased endurance, decreased knowledge of condition, decreased mobility, difficulty walking, decreased ROM, decreased strength,  increased fascial restrictions, impaired perceived functional ability, postural dysfunction, and pain.   ACTIVITY LIMITATIONS: carrying, standing, squatting, stairs, transfers, and locomotion level  PARTICIPATION LIMITATIONS: cleaning, laundry, shopping, community activity, and yard work  PERSONAL FACTORS: Behavior pattern, Time since onset of injury/illness/exacerbation, and 1-2 comorbidities: COPD are also affecting patient's functional outcome.   REHAB POTENTIAL: Good  CLINICAL DECISION MAKING: Evolving/moderate complexity  EVALUATION COMPLEXITY: Moderate   GOALS: Goals reviewed with patient? Yes  SHORT TERM GOALS: Target date: 2 weeks, 10/22/23 I HEP Baseline: Goal status: just uses small pedal machine at home 02/03/24   LONG TERM GOALS: Target date: 01/01/24, 12 weeks, recert until 02/25/24  LEFSLower Extremity  Functional Score: 35 / 80 = 43.8 % improve to 30%or less disability Baseline:  Goal status: 25/80 going 11/24/23; 27% Met 12/29/23  2.  TUG improve from 18 to 14 or less Baseline:  Goal status: 15s 11/24/23; Met 12/29/23  3.  Strength B hip abd, ext 5/5 Baseline: 3+ and 4-/5 Goal status: ongoing 11/24/23; Met 12/29/23  4. Pt will have decrease in bilateral knee pain by 50% from initial pain symptoms.  Baseline: 4/10 Goal Status: 01/22/24 progressing; 4/10 02/03/24      5. Pt will improve 5x STS time to <15s Baseline: 18.37 5xSTS  Goal Status: 01/22/24 progressing 18 sec; 20s some LBP and knee pain today 02/03/24  PLAN:  PT FREQUENCY: 2x/week  PT DURATION: 12 weeks  PLANNED INTERVENTIONS: 97110-Therapeutic exercises, 97530- Therapeutic activity, 97112- Neuromuscular re-education, 97535- Self Care, [id number]- Manual therapy, [phone]- Aquatic Therapy, and Patient/Family education  PLAN FOR NEXT SESSION: progress balance and strengthening  Thersia Alder, Student-PT 02/03/24 4:36 PM   "

## 2024-02-04 ENCOUNTER — Encounter: Payer: Self-pay | Admitting: Pharmacy Technician

## 2024-02-04 ENCOUNTER — Other Ambulatory Visit: Payer: Self-pay

## 2024-02-04 ENCOUNTER — Other Ambulatory Visit (HOSPITAL_COMMUNITY): Payer: Self-pay

## 2024-02-05 ENCOUNTER — Ambulatory Visit: Admitting: Physical Therapy

## 2024-02-05 ENCOUNTER — Other Ambulatory Visit: Payer: Self-pay

## 2024-02-05 ENCOUNTER — Other Ambulatory Visit (HOSPITAL_COMMUNITY): Payer: Self-pay

## 2024-02-05 DIAGNOSIS — G8929 Other chronic pain: Secondary | ICD-10-CM

## 2024-02-05 DIAGNOSIS — R293 Abnormal posture: Secondary | ICD-10-CM

## 2024-02-05 DIAGNOSIS — M5459 Other low back pain: Secondary | ICD-10-CM

## 2024-02-05 DIAGNOSIS — R262 Difficulty in walking, not elsewhere classified: Secondary | ICD-10-CM

## 2024-02-05 LAB — BASIC METABOLIC PANEL WITH GFR
BUN/Creatinine Ratio: 15 (ref 12–28)
BUN: 21 mg/dL (ref 8–27)
CO2: 21 mmol/L (ref 20–29)
Calcium: 8.8 mg/dL (ref 8.7–10.3)
Chloride: 102 mmol/L (ref 96–106)
Creatinine, Ser: 1.41 mg/dL — ABNORMAL HIGH (ref 0.57–1.00)
Glucose: 85 mg/dL (ref 70–99)
Potassium: 4.8 mmol/L (ref 3.5–5.2)
Sodium: 138 mmol/L (ref 134–144)
eGFR: 40 mL/min/1.73 — ABNORMAL LOW

## 2024-02-05 NOTE — Therapy (Signed)
 " OUTPATIENT PHYSICAL THERAPY LOWER EXTREMITY TREATMENT     Patient Name: Rose Miller MRN: [id number] DOB:1955-12-23, 69 y.o., female Today's Date: 02/05/2024  END OF SESSION:  PT End of Session - 02/05/24 1445     Visit Number 23    Date for Recertification  02/25/24    Authorization Type [employer]    PT Start Time 1445    PT Stop Time 1530    PT Time Calculation (min) 45 min            Past Medical History:  Diagnosis Date   Adhesive capsulitis    Aortic valve disease    Arthritis    Asthma    Back pain    CKD (chronic kidney disease) stage 3, GFR 30-59 ml/min (HCC)    Constipation    COPD (chronic obstructive pulmonary disease) (HCC)    Diabetes mellitus without complication (HCC) 10 20 2020   Diuretic-induced hypokalemia    Dyspnea    Fibroid    Heart murmur    Hypercholesterolemia    Hypertension    IT band syndrome    Morbid obesity (HCC)    Palpitations    Periumbilical pain    Pneumonia    Postmenopausal estrogen deficiency    Prediabetes    Preterm labor    S/P TAVR (transcatheter aortic valve replacement) 12/26/2020   Ventral hernia    Past Surgical History:  Procedure Laterality Date   BIOPSY  05/01/2021   Procedure: BIOPSY;  Surgeon: Charlanne Groom, MD;  Location: [facility];  Service: Gastroenterology;;   CARDIAC CATHETERIZATION  11/24/2020   CESAREAN SECTION  1989   COLONOSCOPY     COLONOSCOPY WITH PROPOFOL  N/A 05/01/2021   Procedure: COLONOSCOPY WITH PROPOFOL ;  Surgeon: Charlanne Groom, MD;  Location: [facility];  Service: Gastroenterology;  Laterality: N/A;   ESOPHAGOGASTRODUODENOSCOPY (EGD) WITH PROPOFOL  N/A 05/01/2021   Procedure: ESOPHAGOGASTRODUODENOSCOPY (EGD) WITH PROPOFOL ;  Surgeon: Charlanne Groom, MD;  Location: [facility];  Service: Gastroenterology;  Laterality: N/A;   INTRAOPERATIVE TRANSTHORACIC ECHOCARDIOGRAM N/A 12/26/2020   Procedure: INTRAOPERATIVE TRANSTHORACIC ECHOCARDIOGRAM;  Surgeon: Wendel Lurena POUR,  MD;  Location: [facility];  Service: Open Heart Surgery;  Laterality: N/A;   MALONEY DILATION  05/01/2021   Procedure: AGAPITO DILATION;  Surgeon: Charlanne Groom, MD;  Location: [facility];  Service: Gastroenterology;;   POLYPECTOMY  05/01/2021   Procedure: POLYPECTOMY;  Surgeon: Charlanne Groom, MD;  Location: [facility];  Service: Gastroenterology;;   RIGHT/LEFT HEART CATH AND CORONARY ANGIOGRAPHY N/A 11/24/2020   Procedure: RIGHT/LEFT HEART CATH AND CORONARY ANGIOGRAPHY;  Surgeon: Claudene Victory ORN, MD;  Location: [facility];  Service: Cardiovascular;  Laterality: N/A;   TONSILLECTOMY AND ADENOIDECTOMY  1960   TRANSCATHETER AORTIC VALVE REPLACEMENT, TRANSFEMORAL N/A 12/26/2020   Procedure: TRANSCATHETER AORTIC VALVE REPLACEMENT, TRANSFEMORAL;  Surgeon: Wendel Lurena POUR, MD;  Location: [facility];  Service: Open Heart Surgery;  Laterality: N/A;   Patient Active Problem List   Diagnosis Date Noted   Acquired hammer toe of right foot 09/26/2023   Atherosclerosis of artery of both lower extremities 09/26/2023   Polyneuropathy due to type 2 diabetes mellitus (HCC) 09/26/2023   Primary localized osteoarthrosis of ankle and foot 09/26/2023   Kidney lesion 04/14/2023   Abscess of lower back 03/17/2023   Ingrown nail of great toe of left foot 12/16/2022   Vitamin D  deficiency 12/16/2022   Low back pain 12/16/2022   Chronic pain of both knees 12/16/2022   Dyspnea  on exertion 12/16/2022   Coronary artery disease due to calcified coronary lesion 12/16/2022   Aortic atherosclerosis 12/16/2022   PAF (paroxysmal atrial fibrillation) (HCC) 03/14/2022   Otalgia of left ear 03/14/2022   Diplopia 09/12/2021   Allergic rhinitis 09/12/2021   Pharyngoesophageal dysphagia    Family history of colon cancer in father    Aortic valve disease 12/26/2020   COPD (chronic obstructive pulmonary disease) (HCC) 12/26/2020   Postmenopausal estrogen deficiency 02/18/2019   Former smoker  05/13/2018   Long-term use of aspirin  therapy 05/13/2018   Peripheral vascular disease 05/05/2018   Prediabetes 05/05/2018   Type 2 diabetes mellitus in patient with obesity (HCC) 03/31/2017   CKD stage 3 secondary to diabetes (HCC) 03/31/2017   Hypercholesterolemia 06/28/2016   Aortic valve stenosis 03/29/2016   Periumbilical pain 03/29/2016   IT band syndrome 09/11/2014   Right shoulder pain 09/11/2014   Ventral hernia 05/01/2014   Morbid obesity (HCC) 03/17/2014    PCP: Sebastian Righter MD  REFERRING PROVIDER: same  REFERRING DIAG: lower back, B knee, L hip pain  THERAPY DIAG:  Chronic pain of both knees  Abnormal posture  Difficulty in walking, not elsewhere classified  Other low back pain  Rationale for Evaluation and Treatment: Rehabilitation  ONSET DATE: December 2024  SUBJECTIVE:   SUBJECTIVE STATEMENT: Feeling good, last session really felt good Pnt states shld are kinda rough and se has a grandbaby coming so would like to incorporate shld strength  POOL ACCESS:  Pt is member of [employer].    PERTINENT HISTORY: Referred by PCP due to pain complaints knees, hips lower back PAIN:  Are you having pain? Yes: NPRS scale: 4/10 Pain location: knees BIL and back PRECAUTIONS: Fall  RED FLAGS: None   WEIGHT BEARING RESTRICTIONS: No  FALLS:  Has patient fallen in last 6 months? No  LIVING ENVIRONMENT: Lives with: lives with their son Lives in: House/apartment Stairs: steps outdoors Has following equipment at home: None  OCCUPATION: retired  PLOF: Independent  PATIENT GOALS: be able to walk without pain  NEXT MD VISIT: 1 month  OBJECTIVE:  Note: Objective measures were completed at Evaluation unless otherwise noted.  DIAGNOSTIC FINDINGS: na  PATIENT SURVEYS:  Lower Extremity Functional Score: 35 / 80 = 43.8 % 11/24/23: 25/80 =31 % 12/29/23: 22/80=27% COGNITION: Overall cognitive status: Within functional limits for tasks  assessed     SENSATION: WFL    MUSCLE LENGTH: Hamstrings: Right wfl deg; Left wfl deg Debby test: Right nt deg; Left  nt deg  POSTURE: marked valgum B knees, greater on R  PALPATION: Tender L lateral hip musculature, TFL Tender R glut medius, minimus,  LOWER EXTREMITY ROM:  Passive ROM Right eval Left eval Left  12/18/23 PROM  Hip flexion 105 95   Hip extension     Hip abduction     Hip adduction     Hip internal rotation     Hip external rotation     Knee flexion 112 102 107  Knee extension -3 -12  Lacking 3  Ankle dorsiflexion     Ankle plantarflexion     Ankle inversion     Ankle eversion      (Blank rows = not tested)  LOWER EXTREMITY MMT:  MMT Right eval Left eval Right 11/24/23 Left 11/24/23 R / L 12/29/23  Hip flexion 4- 4- 4 4 5   Hip extension       Hip abduction 3+ 3- 4 4 5   Hip adduction  Hip internal rotation       Hip external rotation       Knee flexion       Knee extension       Ankle dorsiflexion       Ankle plantarflexion 4- 4-     Ankle inversion       Ankle eversion        (Blank rows = not tested)   FUNCTIONAL TESTS:  Timed up and go (TUG): 18.35 11/24/23:  TUG 15s  12/29/23: 13.82  GAIT: Distance walked: 57' in clinic, no device, labored gait, slowed cadence decreased stride length B                                                                                                                              TREATMENT DATE:  [facility] Adult PT Treatment:                                             Date:  02/05/24 MMT RT hip flex 4, left 4+, ABD 4+ BIL,ADD 4+/ 5 BIL BIL knee flexors and ext 4+/5 Nustep L 5 Supine feet on ball small ROM bridge 10x,obl 10 each way, KTC 10x Ball btwn knee small bridge 10x Supine red tband clams and marching 10 x each Pass KTC and trunk rotations Seated ball roll outs fwd and angles 10 x each Leg Press 40# 3 sets 10 Seated isometric abdominals upper and lower 10x each 3# cane ex  standing 10 x each shld flex,chest press abd, upright row,ext and IR     02/03/24 NuStep L5 x 6 min Standing Hip Abduction 2x10 1.5#s Pallof Press 2x10 15# Leg Press 2x10 40# SB Rollouts for low back  Knee to chest stretch  Lumbar spine rotations   01/29/24 Bike L2 x38mins Leg ext 10# 2x10 HS curls 25# 2x10  Calf stretch 20s x2 Calf raises 2x12 Step ups 4 forward and laterally  STS 2x10 Lateral band walks  Seated march with resistance   01/27/24 STS 10 x ADD ball squeeze 20 x Green tband hip flex 2 sets 10 and clams 2 sets 10 Nustep L 5 Red tband standing SL hip flex,ext and abd 10x- struggled and increased pain Resisted gait 20# fwd and backward 5 x then 3 x laterally HS Curls 20# 2x10 Leg ext 10# 2x10 Black bar heel raise and toe raises 2 sets 10  01/22/23 Nustep L 5 Resisted gait 20# fwd and backward 5 x then 3 x laterally- some hip pain and fatigue laterally STS 10 x without UE from slightly elevated mat ADD ball squeeze 2 sets 10 Green tband hip flex 2 sets 10 and clams 2 sets 10 HS Curls 20# 2x10 Leg ext 10# 2x10 4 inch step ups fwd and laterally 10 x each way with UE  support   12/31/23: NuStep L5 x 5 min  Step Ups 4 2x8 HS Curls 15# 2x10 Leg ext 10# 2x10 STS w/ OHP yellow med ball 2x10 Stability ball rollouts   Assessment of new goals    12/29/2023 Pt seen for aquatic therapy today.  Treatment took place in water 3.5-4.75 ft in depth at the [facility] pool. Temp of water was 91.  Pt entered/exited the pool via stairs independently with bil rail.  Exercises - walking forward/ backward with gentle arm swing   - Side Stepping with or without Hand floats: horizontal add/abd-> abd add - suitcase carry with single hand float at side (or on both sides), walking forward/ backward   - Leg Swings Side to Side - hold wall or hand floats    - Forward Backward Leg Swing - hold wall or hand floats   - Heel and toe raises , holding wall in  pool or hand floats   - Standing Fold Over Stretch at [employer] and Backwards with Row Motion with Hand Floats   - Single Leg Clam - hold wall then hand floats  - Sit to Stand with forward arm reach (stick your bottom out and bend your knees) - slow and controlled motion   - Seated straddle on noodle, holding corner, bicycle legs       PATIENT EDUCATION:  Education details: exercise rationale, progression and modification  Person educated: Patient  Education method: Explanation, demonstration Education comprehension: verbalized understanding  HOME EXERCISE PROGRAM: Access Code: [email or web address] URL: [email or web address] Date: 11/24/2023 Prepared by: Almetta Fam  Exercises - Step Up  - 1 x daily - 7 x weekly - 2 sets - 10 reps - Lateral Step Up  - 1 x daily - 7 x weekly - 2 sets - 10 reps - Sit to Stand  - 1 x daily - 7 x weekly - 2 sets - 10 reps - Seated Long Arc Quad  - 1 x daily - 7 x weekly - 2 sets - 10 reps  AQUATIC Access Code: [email or web address] URL: [email or web address] Date: 12/16/2023 * not issued yet Prepared by: [facility] - [address] This aquatic home exercise program from Med[employer] utilizes pictures from land based exercises, but has been adapted prior to lamination and issuance.    ASSESSMENT:  CLINICAL IMPRESSION:   Pnt arrived feeling good, low pain 4/10 but very pleased stated last session really helped. MMT assessed. Progressed ex for core and less in wt bearing to see if that helps keep pain low.Pnt states shld are kinda rough and se has a grandbaby coming so would like to incorporate shld strength so we initiated this today   Pt was discharged from aquatic therapy and reported desire to continue PT in a land setting to continue to progress LE strength and decrease BLE knee pain. New goals added and assessed today. Pt will benefit from resistance training and stretching to decrease joint pain and  increase muscle endurance in order to improve ambulation quality. Pt re certified for 8 weeks for this outpatient facility.    OBJECTIVE IMPAIRMENTS: cardiopulmonary status limiting activity, decreased activity tolerance, decreased balance, decreased endurance, decreased knowledge of condition, decreased mobility, difficulty walking, decreased ROM, decreased strength, increased fascial restrictions, impaired perceived functional ability, postural dysfunction, and pain.   ACTIVITY LIMITATIONS: carrying, standing, squatting, stairs, transfers, and locomotion level  PARTICIPATION LIMITATIONS: cleaning, laundry, shopping, community activity, and yard work  PERSONAL FACTORS: Behavior pattern, Time since onset  of injury/illness/exacerbation, and 1-2 comorbidities: COPD are also affecting patient's functional outcome.   REHAB POTENTIAL: Good  CLINICAL DECISION MAKING: Evolving/moderate complexity  EVALUATION COMPLEXITY: Moderate   GOALS: Goals reviewed with patient? Yes  SHORT TERM GOALS: Target date: 2 weeks, 10/22/23 I HEP Baseline: Goal status: just uses small pedal machine at home 02/03/24   LONG TERM GOALS: Target date: 01/01/24, 12 weeks, recert until 02/25/24  LEFSLower Extremity Functional Score: 35 / 80 = 43.8 % improve to 30%or less disability Baseline:  Goal status: 25/80 going 11/24/23; 27% Met 12/29/23  2.  TUG improve from 18 to 14 or less Baseline:  Goal status: 15s 11/24/23; Met 12/29/23  3.  Strength B hip abd, ext 5/5 Baseline: 3+ and 4-/5 Goal status: ongoing 11/24/23; Met 12/29/23 02/05/24 progressing and getting stronger-see above  4. Pt will have decrease in bilateral knee pain by 50% from initial pain symptoms.  Baseline: 4/10 Goal Status: 01/22/24 progressing; 4/10 02/03/24      5. Pt will improve 5x STS time to <15s Baseline: 18.37 5xSTS  Goal Status: 01/22/24 progressing 18 sec; 20s some LBP and knee pain today 02/03/24  PLAN:  PT FREQUENCY: 2x/week  PT  DURATION: 12 weeks  PLANNED INTERVENTIONS: 97110-Therapeutic exercises, 97530- Therapeutic activity, 97112- Neuromuscular re-education, 97535- Self Care, [id number]- Manual therapy, [phone]- Aquatic Therapy, and Patient/Family education  PLAN FOR NEXT SESSION: progress balance and strengthening  Jon Or PTA 02/05/24 2:46 PM   "

## 2024-02-10 ENCOUNTER — Ambulatory Visit

## 2024-02-10 DIAGNOSIS — R293 Abnormal posture: Secondary | ICD-10-CM

## 2024-02-10 DIAGNOSIS — R262 Difficulty in walking, not elsewhere classified: Secondary | ICD-10-CM | POA: Diagnosis not present

## 2024-02-10 DIAGNOSIS — G8929 Other chronic pain: Secondary | ICD-10-CM

## 2024-02-10 DIAGNOSIS — M5459 Other low back pain: Secondary | ICD-10-CM

## 2024-02-10 NOTE — Therapy (Signed)
 " OUTPATIENT PHYSICAL THERAPY LOWER EXTREMITY TREATMENT     Patient Name: Rose Miller MRN: [id number] DOB:1955/10/17, 69 y.o., female Today's Date: 02/10/2024  END OF SESSION:  PT End of Session - 02/10/24 1314     Visit Number 24    Date for Recertification  02/25/24    Authorization Type [employer]    PT Start Time 1315    PT Stop Time 1355    PT Time Calculation (min) 40 min    Activity Tolerance Patient tolerated treatment well    Behavior During Therapy [facility] for tasks assessed/performed         Past Medical History:  Diagnosis Date   Adhesive capsulitis    Aortic valve disease    Arthritis    Asthma    Back pain    CKD (chronic kidney disease) stage 3, GFR 30-59 ml/min (HCC)    Constipation    COPD (chronic obstructive pulmonary disease) (HCC)    Diabetes mellitus without complication (HCC) 10 20 2020   Diuretic-induced hypokalemia    Dyspnea    Fibroid    Heart murmur    Hypercholesterolemia    Hypertension    IT band syndrome    Morbid obesity (HCC)    Palpitations    Periumbilical pain    Pneumonia    Postmenopausal estrogen deficiency    Prediabetes    Preterm labor    S/P TAVR (transcatheter aortic valve replacement) 12/26/2020   Ventral hernia    Past Surgical History:  Procedure Laterality Date   BIOPSY  05/01/2021   Procedure: BIOPSY;  Surgeon: Charlanne Groom, MD;  Location: [facility];  Service: Gastroenterology;;   CARDIAC CATHETERIZATION  11/24/2020   CESAREAN SECTION  1989   COLONOSCOPY     COLONOSCOPY WITH PROPOFOL  N/A 05/01/2021   Procedure: COLONOSCOPY WITH PROPOFOL ;  Surgeon: Charlanne Groom, MD;  Location: [facility];  Service: Gastroenterology;  Laterality: N/A;   ESOPHAGOGASTRODUODENOSCOPY (EGD) WITH PROPOFOL  N/A 05/01/2021   Procedure: ESOPHAGOGASTRODUODENOSCOPY (EGD) WITH PROPOFOL ;  Surgeon: Charlanne Groom, MD;  Location: [facility];  Service: Gastroenterology;  Laterality: N/A;   INTRAOPERATIVE TRANSTHORACIC  ECHOCARDIOGRAM N/A 12/26/2020   Procedure: INTRAOPERATIVE TRANSTHORACIC ECHOCARDIOGRAM;  Surgeon: Wendel Lurena POUR, MD;  Location: [facility];  Service: Open Heart Surgery;  Laterality: N/A;   MALONEY DILATION  05/01/2021   Procedure: AGAPITO DILATION;  Surgeon: Charlanne Groom, MD;  Location: [facility];  Service: Gastroenterology;;   POLYPECTOMY  05/01/2021   Procedure: POLYPECTOMY;  Surgeon: Charlanne Groom, MD;  Location: [facility];  Service: Gastroenterology;;   RIGHT/LEFT HEART CATH AND CORONARY ANGIOGRAPHY N/A 11/24/2020   Procedure: RIGHT/LEFT HEART CATH AND CORONARY ANGIOGRAPHY;  Surgeon: Claudene Victory ORN, MD;  Location: [facility]VASIVE CV LAB;  Service: Cardiovascular;  Laterality: N/A;   TONSILLECTOMY AND ADENOIDECTOMY  1960   TRANSCATHETER AORTIC VALVE REPLACEMENT, TRANSFEMORAL N/A 12/26/2020   Procedure: TRANSCATHETER AORTIC VALVE REPLACEMENT, TRANSFEMORAL;  Surgeon: Wendel Lurena POUR, MD;  Location: [facility];  Service: Open Heart Surgery;  Laterality: N/A;   Patient Active Problem List   Diagnosis Date Noted   Acquired hammer toe of right foot 09/26/2023   Atherosclerosis of artery of both lower extremities 09/26/2023   Polyneuropathy due to type 2 diabetes mellitus (HCC) 09/26/2023   Primary localized osteoarthrosis of ankle and foot 09/26/2023   Kidney lesion 04/14/2023   Abscess of lower back 03/17/2023   Ingrown nail of great toe of left foot 12/16/2022   Vitamin D  deficiency 12/16/2022  Low back pain 12/16/2022   Chronic pain of both knees 12/16/2022   Dyspnea on exertion 12/16/2022   Coronary artery disease due to calcified coronary lesion 12/16/2022   Aortic atherosclerosis 12/16/2022   PAF (paroxysmal atrial fibrillation) (HCC) 03/14/2022   Otalgia of left ear 03/14/2022   Diplopia 09/12/2021   Allergic rhinitis 09/12/2021   Pharyngoesophageal dysphagia    Family history of colon cancer in father    Aortic valve disease 12/26/2020   COPD (chronic  obstructive pulmonary disease) (HCC) 12/26/2020   Postmenopausal estrogen deficiency 02/18/2019   Former smoker 05/13/2018   Long-term use of aspirin  therapy 05/13/2018   Peripheral vascular disease 05/05/2018   Prediabetes 05/05/2018   Type 2 diabetes mellitus in patient with obesity (HCC) 03/31/2017   CKD stage 3 secondary to diabetes (HCC) 03/31/2017   Hypercholesterolemia 06/28/2016   Aortic valve stenosis 03/29/2016   Periumbilical pain 03/29/2016   IT band syndrome 09/11/2014   Right shoulder pain 09/11/2014   Ventral hernia 05/01/2014   Morbid obesity (HCC) 03/17/2014    PCP: Sebastian Righter MD  REFERRING PROVIDER: same  REFERRING DIAG: lower back, B knee, L hip pain  THERAPY DIAG:  Chronic pain of both knees  Abnormal posture  Difficulty in walking, not elsewhere classified  Other low back pain  Rationale for Evaluation and Treatment: Rehabilitation  ONSET DATE: December 2024  SUBJECTIVE:   SUBJECTIVE STATEMENT: Pt stated she is doing good today; but has some R knee pain 4/10 today. Pt states she was a little sore following last session but didn't last too long.   POOL ACCESS:  Pt is member of [employer].    PERTINENT HISTORY: Referred by PCP due to pain complaints knees, hips lower back PAIN:  Are you having pain? Yes: NPRS scale: 4/10 Pain location: knees BIL and back PRECAUTIONS: Fall  RED FLAGS: None   WEIGHT BEARING RESTRICTIONS: No  FALLS:  Has patient fallen in last 6 months? No  LIVING ENVIRONMENT: Lives with: lives with their son Lives in: House/apartment Stairs: steps outdoors Has following equipment at home: None  OCCUPATION: retired  PLOF: Independent  PATIENT GOALS: be able to walk without pain  NEXT MD VISIT: 1 month  OBJECTIVE:  Note: Objective measures were completed at Evaluation unless otherwise noted.  DIAGNOSTIC FINDINGS: na  PATIENT SURVEYS:  Lower Extremity Functional Score: 35 / 80 = 43.8 % 11/24/23: 25/80 =31  % 12/29/23: 22/80=27% COGNITION: Overall cognitive status: Within functional limits for tasks assessed     SENSATION: WFL    MUSCLE LENGTH: Hamstrings: Right wfl deg; Left wfl deg Debby test: Right nt deg; Left  nt deg  POSTURE: marked valgum B knees, greater on R  PALPATION: Tender L lateral hip musculature, TFL Tender R glut medius, minimus,  LOWER EXTREMITY ROM:  Passive ROM Right eval Left eval Left  12/18/23 PROM  Hip flexion 105 95   Hip extension     Hip abduction     Hip adduction     Hip internal rotation     Hip external rotation     Knee flexion 112 102 107  Knee extension -3 -12  Lacking 3  Ankle dorsiflexion     Ankle plantarflexion     Ankle inversion     Ankle eversion      (Blank rows = not tested)  LOWER EXTREMITY MMT:  MMT Right eval Left eval Right 11/24/23 Left 11/24/23 R / L 12/29/23  Hip flexion 4- 4- 4 4 5  Hip extension       Hip abduction 3+ 3- 4 4 5   Hip adduction       Hip internal rotation       Hip external rotation       Knee flexion       Knee extension       Ankle dorsiflexion       Ankle plantarflexion 4- 4-     Ankle inversion       Ankle eversion        (Blank rows = not tested)   FUNCTIONAL TESTS:  Timed up and go (TUG): 18.35 11/24/23:  TUG 15s  12/29/23: 13.82  GAIT: Distance walked: 57' in clinic, no device, labored gait, slowed cadence decreased stride length B                                                                                                                              TREATMENT DATE:  [facility] Adult PT Treatment:                                             Date:   02/10/24 NuStep L5 x 6 min  Pallof Press 2x10 15# Standing SA Rows 2x10 20#  Cable Lat Pulldowns 2x10 10# Glute Bridges 2x10 SL Leg Press to SB 2x10  Feet on ball lumbar rotation 2x10 HS stretch Knee to chest stretch    02/05/24 MMT RT hip flex 4, left 4+, ABD 4+ BIL,ADD 4+/ 5 BIL BIL knee flexors and ext 4+/5 Nustep  L 5 Supine feet on ball small ROM bridge 10x,obl 10 each way, KTC 10x Ball btwn knee small bridge 10x Supine red tband clams and marching 10 x each Pass KTC and trunk rotations Seated ball roll outs fwd and angles 10 x each Leg Press 40# 3 sets 10 Seated isometric abdominals upper and lower 10x each 3# cane ex standing 10 x each shld flex,chest press abd, upright row,ext and IR     02/03/24 NuStep L5 x 6 min Standing Hip Abduction 2x10 1.5#s Pallof Press 2x10 15# Leg Press 2x10 40# SB Rollouts for low back  Knee to chest stretch  Lumbar spine rotations   01/29/24 Bike L2 x52mins Leg ext 10# 2x10 HS curls 25# 2x10  Calf stretch 20s x2 Calf raises 2x12 Step ups 4 forward and laterally  STS 2x10 Lateral band walks  Seated march with resistance   01/27/24 STS 10 x ADD ball squeeze 20 x Green tband hip flex 2 sets 10 and clams 2 sets 10 Nustep L 5 Red tband standing SL hip flex,ext and abd 10x- struggled and increased pain Resisted gait 20# fwd and backward 5 x then 3 x laterally HS Curls 20# 2x10 Leg ext 10# 2x10 Black bar heel raise and toe raises 2 sets 10  01/22/23 Nustep L 5 Resisted gait 20# fwd and backward 5 x then 3 x laterally- some hip pain and fatigue laterally STS 10 x without UE from slightly elevated mat ADD ball squeeze 2 sets 10 Green tband hip flex 2 sets 10 and clams 2 sets 10 HS Curls 20# 2x10 Leg ext 10# 2x10 4 inch step ups fwd and laterally 10 x each way with UE support   12/31/23: NuStep L5 x 5 min  Step Ups 4 2x8 HS Curls 15# 2x10 Leg ext 10# 2x10 STS w/ OHP yellow med ball 2x10 Stability ball rollouts   Assessment of new goals    12/29/2023 Pt seen for aquatic therapy today.  Treatment took place in water 3.5-4.75 ft in depth at the [facility] pool. Temp of water was 91.  Pt entered/exited the pool via stairs independently with bil rail.  Exercises - walking forward/ backward with gentle arm swing   -  Side Stepping with or without Hand floats: horizontal add/abd-> abd add - suitcase carry with single hand float at side (or on both sides), walking forward/ backward   - Leg Swings Side to Side - hold wall or hand floats    - Forward Backward Leg Swing - hold wall or hand floats   - Heel and toe raises , holding wall in pool or hand floats   - Standing Fold Over Stretch at [employer] and Backwards with Row Motion with Hand Floats   - Single Leg Clam - hold wall then hand floats  - Sit to Stand with forward arm reach (stick your bottom out and bend your knees) - slow and controlled motion   - Seated straddle on noodle, holding corner, bicycle legs       PATIENT EDUCATION:  Education details: exercise rationale, progression and modification  Person educated: Patient  Education method: Explanation, demonstration Education comprehension: verbalized understanding  HOME EXERCISE PROGRAM: Access Code: [email or web address] URL: [email or web address] Date: 11/24/2023 Prepared by: Almetta Fam  Exercises - Step Up  - 1 x daily - 7 x weekly - 2 sets - 10 reps - Lateral Step Up  - 1 x daily - 7 x weekly - 2 sets - 10 reps - Sit to Stand  - 1 x daily - 7 x weekly - 2 sets - 10 reps - Seated Long Arc Quad  - 1 x daily - 7 x weekly - 2 sets - 10 reps  AQUATIC Access Code: [email or web address] URL: [email or web address] Date: 12/16/2023 * not issued yet Prepared by: [facility] [address] This aquatic home exercise program from MedBridge utilizes pictures from land based exercises, but has been adapted prior to lamination and issuance.    ASSESSMENT:  CLINICAL IMPRESSION:   Pt arrived with low pain 4/10 in R LE knee. Session focused on progressing functional strength in the core, low back, shoulders, and knees. Pt required rest periods in between activity and seated rest breaks for standing activity. Session ended with stretching to mitigate joint  stiffness. Pt responding well to current regimen.   Pt was discharged from aquatic therapy and reported desire to continue PT in a land setting to continue to progress LE strength and decrease BLE knee pain. New goals added and assessed today. Pt will benefit from resistance training and stretching to decrease joint pain and increase muscle endurance in order to improve ambulation quality. Pt re certified for 8 weeks for this outpatient facility.    OBJECTIVE  IMPAIRMENTS: cardiopulmonary status limiting activity, decreased activity tolerance, decreased balance, decreased endurance, decreased knowledge of condition, decreased mobility, difficulty walking, decreased ROM, decreased strength, increased fascial restrictions, impaired perceived functional ability, postural dysfunction, and pain.   ACTIVITY LIMITATIONS: carrying, standing, squatting, stairs, transfers, and locomotion level  PARTICIPATION LIMITATIONS: cleaning, laundry, shopping, community activity, and yard work  PERSONAL FACTORS: Behavior pattern, Time since onset of injury/illness/exacerbation, and 1-2 comorbidities: COPD are also affecting patient's functional outcome.   REHAB POTENTIAL: Good  CLINICAL DECISION MAKING: Evolving/moderate complexity  EVALUATION COMPLEXITY: Moderate   GOALS: Goals reviewed with patient? Yes  SHORT TERM GOALS: Target date: 2 weeks, 10/22/23 I HEP Baseline: Goal status: just uses small pedal machine at home 02/03/24; PROGRESSING 02/10/24   LONG TERM GOALS: Target date: 01/01/24, 12 weeks, recert until 02/25/24  LEFSLower Extremity Functional Score: 35 / 80 = 43.8 % improve to 30%or less disability Baseline:  Goal status: 25/80 going 11/24/23; 27% Met 12/29/23  2.  TUG improve from 18 to 14 or less Baseline:  Goal status: 15s 11/24/23; Met 12/29/23  3.  Strength B hip abd, ext 5/5 Baseline: 3+ and 4-/5 Goal status: ongoing 11/24/23; Met 12/29/23 02/05/24 progressing and getting  stronger-see above  4. Pt will have decrease in bilateral knee pain by 50% from initial pain symptoms.  Baseline: 4/10 Goal Status: 01/22/24 progressing; 4/10 02/03/24; 4/10 02/10/24      5. Pt will improve 5x STS time to <15s Baseline: 18.37 5xSTS  Goal Status: 01/22/24 progressing 18 sec; 20s some LBP and knee pain today 02/03/24  PLAN:  PT FREQUENCY: 2x/week  PT DURATION: 12 weeks  PLANNED INTERVENTIONS: 97110-Therapeutic exercises, 97530- Therapeutic activity, 97112- Neuromuscular re-education, 97535- Self Care, [id number]- Manual therapy, [phone]- Aquatic Therapy, and Patient/Family education  PLAN FOR NEXT SESSION: progress balance and strengthening  Thersia Alder, Student-PT 02/10/24 1:19 PM   "

## 2024-02-11 ENCOUNTER — Other Ambulatory Visit: Payer: Self-pay

## 2024-02-12 ENCOUNTER — Ambulatory Visit

## 2024-02-12 DIAGNOSIS — G8929 Other chronic pain: Secondary | ICD-10-CM

## 2024-02-12 DIAGNOSIS — R262 Difficulty in walking, not elsewhere classified: Secondary | ICD-10-CM

## 2024-02-12 DIAGNOSIS — R293 Abnormal posture: Secondary | ICD-10-CM

## 2024-02-12 DIAGNOSIS — M5459 Other low back pain: Secondary | ICD-10-CM

## 2024-02-12 NOTE — Therapy (Signed)
 " OUTPATIENT PHYSICAL THERAPY LOWER EXTREMITY TREATMENT     Patient Name: Rose Miller MRN: [id number] DOB:Jan 03, 1956, 69 y.o., female Today's Date: 02/12/2024  END OF SESSION:  PT End of Session - 02/12/24 1525     Visit Number 25    Date for Recertification  02/25/24    Authorization Type [employer]    PT Start Time 1447    PT Stop Time 1527    PT Time Calculation (min) 40 min    Activity Tolerance Patient tolerated treatment well    Behavior During Therapy [facility] for tasks assessed/performed         Past Medical History:  Diagnosis Date   Adhesive capsulitis    Aortic valve disease    Arthritis    Asthma    Back pain    CKD (chronic kidney disease) stage 3, GFR 30-59 ml/min (HCC)    Constipation    COPD (chronic obstructive pulmonary disease) (HCC)    Diabetes mellitus without complication (HCC) 10 20 2020   Diuretic-induced hypokalemia    Dyspnea    Fibroid    Heart murmur    Hypercholesterolemia    Hypertension    IT band syndrome    Morbid obesity (HCC)    Palpitations    Periumbilical pain    Pneumonia    Postmenopausal estrogen deficiency    Prediabetes    Preterm labor    S/P TAVR (transcatheter aortic valve replacement) 12/26/2020   Ventral hernia    Past Surgical History:  Procedure Laterality Date   BIOPSY  05/01/2021   Procedure: BIOPSY;  Surgeon: Charlanne Groom, MD;  Location: [facility];  Service: Gastroenterology;;   CARDIAC CATHETERIZATION  11/24/2020   CESAREAN SECTION  1989   COLONOSCOPY     COLONOSCOPY WITH PROPOFOL  N/A 05/01/2021   Procedure: COLONOSCOPY WITH PROPOFOL ;  Surgeon: Charlanne Groom, MD;  Location: [facility];  Service: Gastroenterology;  Laterality: N/A;   ESOPHAGOGASTRODUODENOSCOPY (EGD) WITH PROPOFOL  N/A 05/01/2021   Procedure: ESOPHAGOGASTRODUODENOSCOPY (EGD) WITH PROPOFOL ;  Surgeon: Charlanne Groom, MD;  Location: [facility];  Service: Gastroenterology;  Laterality: N/A;   INTRAOPERATIVE TRANSTHORACIC  ECHOCARDIOGRAM N/A 12/26/2020   Procedure: INTRAOPERATIVE TRANSTHORACIC ECHOCARDIOGRAM;  Surgeon: Wendel Lurena POUR, MD;  Location: [facility];  Service: Open Heart Surgery;  Laterality: N/A;   MALONEY DILATION  05/01/2021   Procedure: AGAPITO DILATION;  Surgeon: Charlanne Groom, MD;  Location: [facility];  Service: Gastroenterology;;   POLYPECTOMY  05/01/2021   Procedure: POLYPECTOMY;  Surgeon: Charlanne Groom, MD;  Location: [facility];  Service: Gastroenterology;;   RIGHT/LEFT HEART CATH AND CORONARY ANGIOGRAPHY N/A 11/24/2020   Procedure: RIGHT/LEFT HEART CATH AND CORONARY ANGIOGRAPHY;  Surgeon: Claudene Victory ORN, MD;  Location: [facility] INVASIVE CV LAB;  Service: Cardiovascular;  Laterality: N/A;   TONSILLECTOMY AND ADENOIDECTOMY  1960   TRANSCATHETER AORTIC VALVE REPLACEMENT, TRANSFEMORAL N/A 12/26/2020   Procedure: TRANSCATHETER AORTIC VALVE REPLACEMENT, TRANSFEMORAL;  Surgeon: Wendel Lurena POUR, MD;  Location: [facility];  Service: Open Heart Surgery;  Laterality: N/A;   Patient Active Problem List   Diagnosis Date Noted   Acquired hammer toe of right foot 09/26/2023   Atherosclerosis of artery of both lower extremities 09/26/2023   Polyneuropathy due to type 2 diabetes mellitus (HCC) 09/26/2023   Primary localized osteoarthrosis of ankle and foot 09/26/2023   Kidney lesion 04/14/2023   Abscess of lower back 03/17/2023   Ingrown nail of great toe of left foot 12/16/2022   Vitamin D  deficiency 12/16/2022  Low back pain 12/16/2022   Chronic pain of both knees 12/16/2022   Dyspnea on exertion 12/16/2022   Coronary artery disease due to calcified coronary lesion 12/16/2022   Aortic atherosclerosis 12/16/2022   PAF (paroxysmal atrial fibrillation) (HCC) 03/14/2022   Otalgia of left ear 03/14/2022   Diplopia 09/12/2021   Allergic rhinitis 09/12/2021   Pharyngoesophageal dysphagia    Family history of colon cancer in father    Aortic valve disease 12/26/2020   COPD (chronic  obstructive pulmonary disease) (HCC) 12/26/2020   Postmenopausal estrogen deficiency 02/18/2019   Former smoker 05/13/2018   Long-term use of aspirin  therapy 05/13/2018   Peripheral vascular disease 05/05/2018   Prediabetes 05/05/2018   Type 2 diabetes mellitus in patient with obesity (HCC) 03/31/2017   CKD stage 3 secondary to diabetes (HCC) 03/31/2017   Hypercholesterolemia 06/28/2016   Aortic valve stenosis 03/29/2016   Periumbilical pain 03/29/2016   IT band syndrome 09/11/2014   Right shoulder pain 09/11/2014   Ventral hernia 05/01/2014   Morbid obesity (HCC) 03/17/2014    PCP: Sebastian Righter MD  REFERRING PROVIDER: same  REFERRING DIAG: lower back, B knee, L hip pain  THERAPY DIAG:  No diagnosis found.  Rationale for Evaluation and Treatment: Rehabilitation  ONSET DATE: December 2024  SUBJECTIVE:   SUBJECTIVE STATEMENT: Pt reports her back is doing fine today just some soreness in the R knee upon arrival. Pt states she was a little sore following last session but it did not last too long.   POOL ACCESS:  Pt is member of [employer].    PERTINENT HISTORY: Referred by PCP due to pain complaints knees, hips lower back PAIN:  Are you having pain? Yes: NPRS scale: 4/10 Pain location: knees BIL and back PRECAUTIONS: Fall  RED FLAGS: None   WEIGHT BEARING RESTRICTIONS: No  FALLS:  Has patient fallen in last 6 months? No  LIVING ENVIRONMENT: Lives with: lives with their son Lives in: House/apartment Stairs: steps outdoors Has following equipment at home: None  OCCUPATION: retired  PLOF: Independent  PATIENT GOALS: be able to walk without pain  NEXT MD VISIT: 1 month  OBJECTIVE:  Note: Objective measures were completed at Evaluation unless otherwise noted.  DIAGNOSTIC FINDINGS: na  PATIENT SURVEYS:  Lower Extremity Functional Score: 35 / 80 = 43.8 % 11/24/23: 25/80 =31 % 12/29/23: 22/80=27% COGNITION: Overall cognitive status: Within functional  limits for tasks assessed     SENSATION: WFL    MUSCLE LENGTH: Hamstrings: Right wfl deg; Left wfl deg Debby test: Right nt deg; Left  nt deg  POSTURE: marked valgum B knees, greater on R  PALPATION: Tender L lateral hip musculature, TFL Tender R glut medius, minimus,  LOWER EXTREMITY ROM:  Passive ROM Right eval Left eval Left  12/18/23 PROM  Hip flexion 105 95   Hip extension     Hip abduction     Hip adduction     Hip internal rotation     Hip external rotation     Knee flexion 112 102 107  Knee extension -3 -12  Lacking 3  Ankle dorsiflexion     Ankle plantarflexion     Ankle inversion     Ankle eversion      (Blank rows = not tested)  LOWER EXTREMITY MMT:  MMT Right eval Left eval Right 11/24/23 Left 11/24/23 R / L 12/29/23  Hip flexion 4- 4- 4 4 5   Hip extension       Hip abduction 3+ 3- 4  4 5  Hip adduction       Hip internal rotation       Hip external rotation       Knee flexion       Knee extension       Ankle dorsiflexion       Ankle plantarflexion 4- 4-     Ankle inversion       Ankle eversion        (Blank rows = not tested)   FUNCTIONAL TESTS:  Timed up and go (TUG): 18.35 11/24/23:  TUG 15s  12/29/23: 13.82  GAIT: Distance walked: 36' in clinic, no device, labored gait, slowed cadence decreased stride length B                                                                                                                              TREATMENT DATE:  [facility] Adult PT Treatment:                                             Date:   02/12/24 Nustep L5 x  Lateral Step Ups 6 2x10  Pallof Press 10# Cable Pulldowns 2x10 20# Feet on SB lumbar rotations  Glute bridges w/ hip adduction blue ball Hip Abduction Blue Tband 2x10 HS stretch  Knee to chest stretch  Piriformis stretch  Windshiled wipers lumbar stretch  02/10/24 NuStep L5 x 6 min  Pallof Press 2x10 15# Standing SA Rows 2x10 20#  Cable Lat Pulldowns 2x10 10# Glute  Bridges 2x10 SL Leg Press to SB 2x10  Feet on ball lumbar rotation 2x10 HS stretch Knee to chest stretch    02/05/24 MMT RT hip flex 4, left 4+, ABD 4+ BIL,ADD 4+/ 5 BIL BIL knee flexors and ext 4+/5 Nustep L 5 Supine feet on ball small ROM bridge 10x,obl 10 each way, KTC 10x Ball btwn knee small bridge 10x Supine red tband clams and marching 10 x each Pass KTC and trunk rotations Seated ball roll outs fwd and angles 10 x each Leg Press 40# 3 sets 10 Seated isometric abdominals upper and lower 10x each 3# cane ex standing 10 x each shld flex,chest press abd, upright row,ext and IR     02/03/24 NuStep L5 x 6 min Standing Hip Abduction 2x10 1.5#s Pallof Press 2x10 15# Leg Press 2x10 40# SB Rollouts for low back  Knee to chest stretch  Lumbar spine rotations   01/29/24 Bike L2 x34mins Leg ext 10# 2x10 HS curls 25# 2x10  Calf stretch 20s x2 Calf raises 2x12 Step ups 4 forward and laterally  STS 2x10 Lateral band walks  Seated march with resistance   01/27/24 STS 10 x ADD ball squeeze 20 x Green tband hip flex 2 sets 10 and clams 2 sets 10 Nustep L 5 Red tband standing  SL hip flex,ext and abd 10x- struggled and increased pain Resisted gait 20# fwd and backward 5 x then 3 x laterally HS Curls 20# 2x10 Leg ext 10# 2x10 Black bar heel raise and toe raises 2 sets 10  01/22/23 Nustep L 5 Resisted gait 20# fwd and backward 5 x then 3 x laterally- some hip pain and fatigue laterally STS 10 x without UE from slightly elevated mat ADD ball squeeze 2 sets 10 Green tband hip flex 2 sets 10 and clams 2 sets 10 HS Curls 20# 2x10 Leg ext 10# 2x10 4 inch step ups fwd and laterally 10 x each way with UE support   12/31/23: NuStep L5 x 5 min  Step Ups 4 2x8 HS Curls 15# 2x10 Leg ext 10# 2x10 STS w/ OHP yellow med ball 2x10 Stability ball rollouts   Assessment of new goals    12/29/2023 Pt seen for aquatic therapy today.  Treatment took place in water  3.5-4.75 ft in depth at the [facility] pool. Temp of water was 91.  Pt entered/exited the pool via stairs independently with bil rail.  Exercises - walking forward/ backward with gentle arm swing   - Side Stepping with or without Hand floats: horizontal add/abd-> abd add - suitcase carry with single hand float at side (or on both sides), walking forward/ backward   - Leg Swings Side to Side - hold wall or hand floats    - Forward Backward Leg Swing - hold wall or hand floats   - Heel and toe raises , holding wall in pool or hand floats   - Standing Fold Over Stretch at [employer] and Backwards with Row Motion with Hand Floats   - Single Leg Clam - hold wall then hand floats  - Sit to Stand with forward arm reach (stick your bottom out and bend your knees) - slow and controlled motion   - Seated straddle on noodle, holding corner, bicycle legs       PATIENT EDUCATION:  Education details: exercise rationale, progression and modification  Person educated: Patient  Education method: Explanation, demonstration Education comprehension: verbalized understanding  HOME EXERCISE PROGRAM: Access Code: [email or web address] URL: [email or web address] Date: 11/24/2023 Prepared by: Almetta Fam  Exercises - Step Up  - 1 x daily - 7 x weekly - 2 sets - 10 reps - Lateral Step Up  - 1 x daily - 7 x weekly - 2 sets - 10 reps - Sit to Stand  - 1 x daily - 7 x weekly - 2 sets - 10 reps - Seated Long Arc Quad  - 1 x daily - 7 x weekly - 2 sets - 10 reps  AQUATIC Access Code: [email or web address] URL: [email or web address] Date: 12/16/2023 * not issued yet Prepared by: [facility] [address] This aquatic home exercise program from Med[employer] utilizes pictures from land based exercises, but has been adapted prior to lamination and issuance.    ASSESSMENT:  CLINICAL IMPRESSION:   Pt doing well with current regimen with ability to progress  functional movement patterns today. Session focused on increasing standing tolerance and increasing functional movement, specifically step ups done laterally to challenge lateral movement and strength. Pt required rest periods in between activity and seated rest breaks for standing activity. Session ended with stretching to mitigate joint stiffness. Pt doing well and will continue to benefit from skilled PT to increase strength and endurance that will in turn decrease joint pain.  Pt was discharged from aquatic therapy and reported desire to continue PT in a land setting to continue to progress LE strength and decrease BLE knee pain. New goals added and assessed today. Pt will benefit from resistance training and stretching to decrease joint pain and increase muscle endurance in order to improve ambulation quality. Pt re certified for 8 weeks for this outpatient facility.    OBJECTIVE IMPAIRMENTS: cardiopulmonary status limiting activity, decreased activity tolerance, decreased balance, decreased endurance, decreased knowledge of condition, decreased mobility, difficulty walking, decreased ROM, decreased strength, increased fascial restrictions, impaired perceived functional ability, postural dysfunction, and pain.   ACTIVITY LIMITATIONS: carrying, standing, squatting, stairs, transfers, and locomotion level  PARTICIPATION LIMITATIONS: cleaning, laundry, shopping, community activity, and yard work  PERSONAL FACTORS: Behavior pattern, Time since onset of injury/illness/exacerbation, and 1-2 comorbidities: COPD are also affecting patient's functional outcome.   REHAB POTENTIAL: Good  CLINICAL DECISION MAKING: Evolving/moderate complexity  EVALUATION COMPLEXITY: Moderate   GOALS: Goals reviewed with patient? Yes  SHORT TERM GOALS: Target date: 2 weeks, 10/22/23 I HEP Baseline: Goal status: just uses small pedal machine at home 02/03/24; PROGRESSING 02/10/24   LONG TERM GOALS: Target date:  01/01/24, 12 weeks, recert until 02/25/24  LEFSLower Extremity Functional Score: 35 / 80 = 43.8 % improve to 30%or less disability Baseline:  Goal status: 25/80 going 11/24/23; 27% Met 12/29/23  2.  TUG improve from 18 to 14 or less Baseline:  Goal status: 15s 11/24/23; Met 12/29/23  3.  Strength B hip abd, ext 5/5 Baseline: 3+ and 4-/5 Goal status: ongoing 11/24/23; Met 12/29/23 02/05/24 progressing and getting stronger-see above  4. Pt will have decrease in bilateral knee pain by 50% from initial pain symptoms.  Baseline: 4/10 Goal Status: 01/22/24 progressing; 4/10 02/03/24; 4/10 02/10/24; unchanged 02/12/24      5. Pt will improve 5x STS time to <15s Baseline: 18.37 5xSTS  Goal Status: 01/22/24 progressing 18 sec; 20s some LBP and knee pain today 02/03/24  PLAN:  PT FREQUENCY: 2x/week  PT DURATION: 12 weeks  PLANNED INTERVENTIONS: 97110-Therapeutic exercises, 97530- Therapeutic activity, 97112- Neuromuscular re-education, 97535- Self Care, [id number]- Manual therapy, [phone]- Aquatic Therapy, and Patient/Family education  PLAN FOR NEXT SESSION: progress balance and strengthening  Thersia Alder, Student-PT 02/12/24 3:27 PM   "

## 2024-02-16 ENCOUNTER — Ambulatory Visit: Admitting: Physical Therapy

## 2024-02-17 ENCOUNTER — Telehealth: Admitting: Pulmonary Disease

## 2024-02-17 ENCOUNTER — Other Ambulatory Visit: Payer: Self-pay

## 2024-02-17 DIAGNOSIS — Z87891 Personal history of nicotine dependence: Secondary | ICD-10-CM | POA: Diagnosis not present

## 2024-02-17 DIAGNOSIS — J449 Chronic obstructive pulmonary disease, unspecified: Secondary | ICD-10-CM | POA: Diagnosis not present

## 2024-02-18 ENCOUNTER — Ambulatory Visit: Attending: Family Medicine | Admitting: Physical Therapy

## 2024-02-18 ENCOUNTER — Encounter: Payer: Self-pay | Admitting: Physical Therapy

## 2024-02-18 DIAGNOSIS — R293 Abnormal posture: Secondary | ICD-10-CM

## 2024-02-18 DIAGNOSIS — R262 Difficulty in walking, not elsewhere classified: Secondary | ICD-10-CM

## 2024-02-18 DIAGNOSIS — M5459 Other low back pain: Secondary | ICD-10-CM

## 2024-02-18 DIAGNOSIS — G8929 Other chronic pain: Secondary | ICD-10-CM

## 2024-02-18 NOTE — Therapy (Signed)
 " OUTPATIENT PHYSICAL THERAPY LOWER EXTREMITY TREATMENT     Patient Name: Rose Miller MRN: [id number] DOB:Sep 26, 1955, 69 y.o., female Today's Date: 02/18/2024  END OF SESSION:  PT End of Session - 02/18/24 1602     Visit Number 26    Date for Recertification  02/25/24    PT Start Time 1600    PT Stop Time 1645    PT Time Calculation (min) 45 min    Activity Tolerance Patient tolerated treatment well    Behavior During Therapy [facility] for tasks assessed/performed         Past Medical History:  Diagnosis Date   Adhesive capsulitis    Aortic valve disease    Arthritis    Asthma    Back pain    CKD (chronic kidney disease) stage 3, GFR 30-59 ml/min (HCC)    Constipation    COPD (chronic obstructive pulmonary disease) (HCC)    Diabetes mellitus without complication (HCC) 10 20 2020   Diuretic-induced hypokalemia    Dyspnea    Fibroid    Heart murmur    Hypercholesterolemia    Hypertension    IT band syndrome    Morbid obesity (HCC)    Palpitations    Periumbilical pain    Pneumonia    Postmenopausal estrogen deficiency    Prediabetes    Preterm labor    S/P TAVR (transcatheter aortic valve replacement) 12/26/2020   Ventral hernia    Past Surgical History:  Procedure Laterality Date   BIOPSY  05/01/2021   Procedure: BIOPSY;  Surgeon: Charlanne Groom, MD;  Location: [facility];  Service: Gastroenterology;;   CARDIAC CATHETERIZATION  11/24/2020   CESAREAN SECTION  1989   COLONOSCOPY     COLONOSCOPY WITH PROPOFOL  N/A 05/01/2021   Procedure: COLONOSCOPY WITH PROPOFOL ;  Surgeon: Charlanne Groom, MD;  Location: [facility];  Service: Gastroenterology;  Laterality: N/A;   ESOPHAGOGASTRODUODENOSCOPY (EGD) WITH PROPOFOL  N/A 05/01/2021   Procedure: ESOPHAGOGASTRODUODENOSCOPY (EGD) WITH PROPOFOL ;  Surgeon: Charlanne Groom, MD;  Location: [facility];  Service: Gastroenterology;  Laterality: N/A;   INTRAOPERATIVE TRANSTHORACIC ECHOCARDIOGRAM N/A 12/26/2020   Procedure:  INTRAOPERATIVE TRANSTHORACIC ECHOCARDIOGRAM;  Surgeon: Wendel Lurena POUR, MD;  Location: [facility];  Service: Open Heart Surgery;  Laterality: N/A;   MALONEY DILATION  05/01/2021   Procedure: AGAPITO DILATION;  Surgeon: Charlanne Groom, MD;  Location: [facility];  Service: Gastroenterology;;   POLYPECTOMY  05/01/2021   Procedure: POLYPECTOMY;  Surgeon: Charlanne Groom, MD;  Location: [facility];  Service: Gastroenterology;;   RIGHT/LEFT HEART CATH AND CORONARY ANGIOGRAPHY N/A 11/24/2020   Procedure: RIGHT/LEFT HEART CATH AND CORONARY ANGIOGRAPHY;  Surgeon: Claudene Victory ORN, MD;  Location: [facility];  Service: Cardiovascular;  Laterality: N/A;   TONSILLECTOMY AND ADENOIDECTOMY  1960   TRANSCATHETER AORTIC VALVE REPLACEMENT, TRANSFEMORAL N/A 12/26/2020   Procedure: TRANSCATHETER AORTIC VALVE REPLACEMENT, TRANSFEMORAL;  Surgeon: Wendel Lurena POUR, MD;  Location: [facility];  Service: Open Heart Surgery;  Laterality: N/A;   Patient Active Problem List   Diagnosis Date Noted   Acquired hammer toe of right foot 09/26/2023   Atherosclerosis of artery of both lower extremities 09/26/2023   Polyneuropathy due to type 2 diabetes mellitus (HCC) 09/26/2023   Primary localized osteoarthrosis of ankle and foot 09/26/2023   Kidney lesion 04/14/2023   Abscess of lower back 03/17/2023   Ingrown nail of great toe of left foot 12/16/2022   Vitamin D  deficiency 12/16/2022   Low back pain 12/16/2022  Chronic pain of both knees 12/16/2022   Dyspnea on exertion 12/16/2022   Coronary artery disease due to calcified coronary lesion 12/16/2022   Aortic atherosclerosis 12/16/2022   PAF (paroxysmal atrial fibrillation) (HCC) 03/14/2022   Otalgia of left ear 03/14/2022   Diplopia 09/12/2021   Allergic rhinitis 09/12/2021   Pharyngoesophageal dysphagia    Family history of colon cancer in father    Aortic valve disease 12/26/2020   COPD (chronic obstructive pulmonary disease) (HCC)  12/26/2020   Postmenopausal estrogen deficiency 02/18/2019   Former smoker 05/13/2018   Long-term use of aspirin  therapy 05/13/2018   Peripheral vascular disease 05/05/2018   Prediabetes 05/05/2018   Type 2 diabetes mellitus in patient with obesity (HCC) 03/31/2017   CKD stage 3 secondary to diabetes (HCC) 03/31/2017   Hypercholesterolemia 06/28/2016   Aortic valve stenosis 03/29/2016   Periumbilical pain 03/29/2016   IT band syndrome 09/11/2014   Right shoulder pain 09/11/2014   Ventral hernia 05/01/2014   Morbid obesity (HCC) 03/17/2014    PCP: Sebastian Righter MD  REFERRING PROVIDER: same  REFERRING DIAG: lower back, B knee, L hip pain  THERAPY DIAG:  Other low back pain  Chronic pain of both knees  Abnormal posture  Difficulty in walking, not elsewhere classified  Rationale for Evaluation and Treatment: Rehabilitation  ONSET DATE: December 2024  SUBJECTIVE:   SUBJECTIVE STATEMENT: Pain in both hips and R knee  POOL ACCESS:  Pt is member of [employer].    PERTINENT HISTORY: Referred by PCP due to pain complaints knees, hips lower back PAIN:  Are you having pain? Yes: NPRS scale: 5/10 Pain location: knees BIL and back PRECAUTIONS: Fall  RED FLAGS: None   WEIGHT BEARING RESTRICTIONS: No  FALLS:  Has patient fallen in last 6 months? No  LIVING ENVIRONMENT: Lives with: lives with their son Lives in: House/apartment Stairs: steps outdoors Has following equipment at home: None  OCCUPATION: retired  PLOF: Independent  PATIENT GOALS: be able to walk without pain  NEXT MD VISIT: 1 month  OBJECTIVE:  Note: Objective measures were completed at Evaluation unless otherwise noted.  DIAGNOSTIC FINDINGS: na  PATIENT SURVEYS:  Lower Extremity Functional Score: 35 / 80 = 43.8 % 11/24/23: 25/80 =31 % 12/29/23: 22/80=27% COGNITION: Overall cognitive status: Within functional limits for tasks assessed     SENSATION: WFL    MUSCLE LENGTH: Hamstrings:  Right wfl deg; Left wfl deg Debby test: Right nt deg; Left  nt deg  POSTURE: marked valgum B knees, greater on R  PALPATION: Tender L lateral hip musculature, TFL Tender R glut medius, minimus,  LOWER EXTREMITY ROM:  Passive ROM Right eval Left eval Left  12/18/23 PROM  Hip flexion 105 95   Hip extension     Hip abduction     Hip adduction     Hip internal rotation     Hip external rotation     Knee flexion 112 102 107  Knee extension -3 -12  Lacking 3  Ankle dorsiflexion     Ankle plantarflexion     Ankle inversion     Ankle eversion      (Blank rows = not tested)  LOWER EXTREMITY MMT:  MMT Right eval Left eval Right 11/24/23 Left 11/24/23 R / L 12/29/23  Hip flexion 4- 4- 4 4 5   Hip extension       Hip abduction 3+ 3- 4 4 5   Hip adduction       Hip internal rotation  Hip external rotation       Knee flexion       Knee extension       Ankle dorsiflexion       Ankle plantarflexion 4- 4-     Ankle inversion       Ankle eversion        (Blank rows = not tested)   FUNCTIONAL TESTS:  Timed up and go (TUG): 18.35 11/24/23:  TUG 15s  12/29/23: 13.82  GAIT: Distance walked: 20' in clinic, no device, labored gait, slowed cadence decreased stride length B                                                                                                                              TREATMENT DATE:  [facility] Adult PT Treatment:                                             Date:  02/18/24 NuStep L5 x 6 min 40lb resisted gait 4 way x 3 each Sit to stands holding yellow ball 2x10 6in step ups 2x10 6in lateral step ups 2x10 Passive stretching to HS, piriformis, K2C   02/12/24 Nustep L5 x  Lateral Step Ups 6 2x10  Pallof Press 10# Cable Pulldowns 2x10 20# Feet on SB lumbar rotations  Glute bridges w/ hip adduction blue ball Hip Abduction Blue Tband 2x10 HS stretch  Knee to chest stretch  Piriformis stretch  Windshiled wipers lumbar  stretch  02/10/24 NuStep L5 x 6 min  Pallof Press 2x10 15# Standing SA Rows 2x10 20#  Cable Lat Pulldowns 2x10 10# Glute Bridges 2x10 SL Leg Press to SB 2x10  Feet on ball lumbar rotation 2x10 HS stretch Knee to chest stretch    02/05/24 MMT RT hip flex 4, left 4+, ABD 4+ BIL,ADD 4+/ 5 BIL BIL knee flexors and ext 4+/5 Nustep L 5 Supine feet on ball small ROM bridge 10x,obl 10 each way, KTC 10x Ball btwn knee small bridge 10x Supine red tband clams and marching 10 x each Pass KTC and trunk rotations Seated ball roll outs fwd and angles 10 x each Leg Press 40# 3 sets 10 Seated isometric abdominals upper and lower 10x each 3# cane ex standing 10 x each shld flex,chest press abd, upright row,ext and IR     02/03/24 NuStep L5 x 6 min Standing Hip Abduction 2x10 1.5#s Pallof Press 2x10 15# Leg Press 2x10 40# SB Rollouts for low back  Knee to chest stretch  Lumbar spine rotations   01/29/24 Bike L2 x32mins Leg ext 10# 2x10 HS curls 25# 2x10  Calf stretch 20s x2 Calf raises 2x12 Step ups 4 forward and laterally  STS 2x10 Lateral band walks  Seated march with resistance   01/27/24 STS 10 x ADD ball squeeze 20 x Green tband  hip flex 2 sets 10 and clams 2 sets 10 Nustep L 5 Red tband standing SL hip flex,ext and abd 10x- struggled and increased pain Resisted gait 20# fwd and backward 5 x then 3 x laterally HS Curls 20# 2x10 Leg ext 10# 2x10 Black bar heel raise and toe raises 2 sets 10  01/22/23 Nustep L 5 Resisted gait 20# fwd and backward 5 x then 3 x laterally- some hip pain and fatigue laterally STS 10 x without UE from slightly elevated mat ADD ball squeeze 2 sets 10 Green tband hip flex 2 sets 10 and clams 2 sets 10 HS Curls 20# 2x10 Leg ext 10# 2x10 4 inch step ups fwd and laterally 10 x each way with UE support   12/31/23: NuStep L5 x 5 min  Step Ups 4 2x8 HS Curls 15# 2x10 Leg ext 10# 2x10 STS w/ OHP yellow med ball 2x10 Stability  ball rollouts   Assessment of new goals    PATIENT EDUCATION:  Education details: exercise rationale, progression and modification  Person educated: Patient  Education method: Explanation, demonstration Education comprehension: verbalized understanding  HOME EXERCISE PROGRAM: Access Code: [email or web address] URL: [email or web address] Date: 11/24/2023 Prepared by: Almetta Fam  Exercises - Step Up  - 1 x daily - 7 x weekly - 2 sets - 10 reps - Lateral Step Up  - 1 x daily - 7 x weekly - 2 sets - 10 reps - Sit to Stand  - 1 x daily - 7 x weekly - 2 sets - 10 reps - Seated Long Arc Quad  - 1 x daily - 7 x weekly - 2 sets - 10 reps  AQUATIC Access Code: [email or web address] URL: [email or web address] Date: 12/16/2023 * not issued yet Prepared by: [facility] [address] This aquatic home exercise program from MedBridge utilizes pictures from land based exercises, but has been adapted prior to lamination and issuance.    ASSESSMENT:  CLINICAL IMPRESSION:   Pt doing well with current regimen with ability to progress functional movement patterns today. Session focused on increasing standing tolerance with functional movement. Pt required rest periods in between activity and seated rest breaks for standing activity. Session ended with stretching to mitigate joint stiffness. Pt doing well and will continue to benefit from skilled PT to increase strength and endurance that will in turn decrease joint pain.   Pt was discharged from aquatic therapy and reported desire to continue PT in a land setting to continue to progress LE strength and decrease BLE knee pain. New goals added and assessed today. Pt will benefit from resistance training and stretching to decrease joint pain and increase muscle endurance in order to improve ambulation quality. Pt re certified for 8 weeks for this outpatient facility.    OBJECTIVE IMPAIRMENTS: cardiopulmonary status limiting  activity, decreased activity tolerance, decreased balance, decreased endurance, decreased knowledge of condition, decreased mobility, difficulty walking, decreased ROM, decreased strength, increased fascial restrictions, impaired perceived functional ability, postural dysfunction, and pain.   ACTIVITY LIMITATIONS: carrying, standing, squatting, stairs, transfers, and locomotion level  PARTICIPATION LIMITATIONS: cleaning, laundry, shopping, community activity, and yard work  PERSONAL FACTORS: Behavior pattern, Time since onset of injury/illness/exacerbation, and 1-2 comorbidities: COPD are also affecting patient's functional outcome.   REHAB POTENTIAL: Good  CLINICAL DECISION MAKING: Evolving/moderate complexity  EVALUATION COMPLEXITY: Moderate   GOALS: Goals reviewed with patient? Yes  SHORT TERM GOALS: Target date: 2 weeks, 10/22/23 I HEP Baseline: Goal status: just uses small pedal machine  at home 02/03/24; PROGRESSING 02/10/24   LONG TERM GOALS: Target date: 01/01/24, 12 weeks, recert until 02/25/24  LEFSLower Extremity Functional Score: 35 / 80 = 43.8 % improve to 30%or less disability Baseline:  Goal status: 25/80 going 11/24/23; 27% Met 12/29/23  2.  TUG improve from 18 to 14 or less Baseline:  Goal status: 15s 11/24/23; Met 12/29/23  3.  Strength B hip abd, ext 5/5 Baseline: 3+ and 4-/5 Goal status: ongoing 11/24/23; Met 12/29/23 02/05/24 progressing and getting stronger-see above  4. Pt will have decrease in bilateral knee pain by 50% from initial pain symptoms.  Baseline: 4/10 Goal Status: 01/22/24 progressing; 4/10 02/03/24; 4/10 02/10/24; unchanged 02/12/24      5. Pt will improve 5x STS time to <15s Baseline: 18.37 5xSTS  Goal Status: 01/22/24 progressing 18 sec; 20s some LBP and knee pain today 02/03/24  PLAN:  PT FREQUENCY: 2x/week  PT DURATION: 12 weeks  PLANNED INTERVENTIONS: 97110-Therapeutic exercises, 97530- Therapeutic activity, 97112- Neuromuscular  re-education, 97535- Self Care, [id number]- Manual therapy, [phone]- Aquatic Therapy, and Patient/Family education  PLAN FOR NEXT SESSION: progress balance and strengthening  Tanda Sorrow, PTA 02/18/24 4:02 PM   "

## 2024-02-19 ENCOUNTER — Other Ambulatory Visit (HOSPITAL_COMMUNITY): Payer: Self-pay

## 2024-02-19 ENCOUNTER — Ambulatory Visit: Admitting: Physical Therapy

## 2024-02-19 ENCOUNTER — Other Ambulatory Visit: Payer: Self-pay

## 2024-02-19 ENCOUNTER — Other Ambulatory Visit (INDEPENDENT_AMBULATORY_CARE_PROVIDER_SITE_OTHER): Payer: Self-pay | Admitting: Physician Assistant

## 2024-02-19 MED ORDER — AZELASTINE HCL 0.1 % NA SOLN
2.0000 | Freq: Two times a day (BID) | NASAL | 12 refills | Status: AC
  Filled 2024-02-19: qty 30, 50d supply, fill #0

## 2024-02-23 ENCOUNTER — Ambulatory Visit: Admitting: Physical Therapy

## 2024-02-24 ENCOUNTER — Ambulatory Visit: Admitting: Physical Therapy

## 2024-02-25 ENCOUNTER — Ambulatory Visit: Admitting: Physical Therapy

## 2024-03-17 ENCOUNTER — Ambulatory Visit: Admitting: Family Medicine

## 2024-03-29 ENCOUNTER — Ambulatory Visit

## 2024-04-07 ENCOUNTER — Encounter: Admitting: Family Medicine
